# Patient Record
Sex: Female | Born: 1945
Health system: Southern US, Community
[De-identification: ages and names within clinical notes are randomized; demographics above are authoritative.]

## PROBLEM LIST (undated history)

## (undated) DIAGNOSIS — M545 Low back pain, unspecified: Secondary | ICD-10-CM

## (undated) DIAGNOSIS — F32A Depression, unspecified: Secondary | ICD-10-CM

## (undated) DIAGNOSIS — G454 Transient global amnesia: Secondary | ICD-10-CM

## (undated) DIAGNOSIS — F329 Major depressive disorder, single episode, unspecified: Secondary | ICD-10-CM

## (undated) DIAGNOSIS — M1990 Unspecified osteoarthritis, unspecified site: Secondary | ICD-10-CM

## (undated) DIAGNOSIS — M5417 Radiculopathy, lumbosacral region: Secondary | ICD-10-CM

## (undated) DIAGNOSIS — F419 Anxiety disorder, unspecified: Secondary | ICD-10-CM

## (undated) DIAGNOSIS — M797 Fibromyalgia: Secondary | ICD-10-CM

## (undated) DIAGNOSIS — E079 Disorder of thyroid, unspecified: Secondary | ICD-10-CM

## (undated) DIAGNOSIS — I10 Essential (primary) hypertension: Secondary | ICD-10-CM

## (undated) DIAGNOSIS — J449 Chronic obstructive pulmonary disease, unspecified: Secondary | ICD-10-CM

## (undated) DIAGNOSIS — G8929 Other chronic pain: Secondary | ICD-10-CM

## (undated) DIAGNOSIS — E275 Adrenomedullary hyperfunction: Secondary | ICD-10-CM

## (undated) DIAGNOSIS — Z87442 Personal history of urinary calculi: Secondary | ICD-10-CM

## (undated) DIAGNOSIS — I639 Cerebral infarction, unspecified: Secondary | ICD-10-CM

## (undated) DIAGNOSIS — G459 Transient cerebral ischemic attack, unspecified: Secondary | ICD-10-CM

## (undated) DIAGNOSIS — S8291XA Unspecified fracture of right lower leg, initial encounter for closed fracture: Secondary | ICD-10-CM

## (undated) DIAGNOSIS — M329 Systemic lupus erythematosus, unspecified: Secondary | ICD-10-CM

## (undated) DIAGNOSIS — S4291XA Fracture of right shoulder girdle, part unspecified, initial encounter for closed fracture: Secondary | ICD-10-CM

## (undated) DIAGNOSIS — S92909A Unspecified fracture of unspecified foot, initial encounter for closed fracture: Secondary | ICD-10-CM

## (undated) DIAGNOSIS — I209 Angina pectoris, unspecified: Secondary | ICD-10-CM

## (undated) DIAGNOSIS — G43909 Migraine, unspecified, not intractable, without status migrainosus: Secondary | ICD-10-CM

## (undated) DIAGNOSIS — G629 Polyneuropathy, unspecified: Secondary | ICD-10-CM

## (undated) DIAGNOSIS — R609 Edema, unspecified: Secondary | ICD-10-CM

## (undated) DIAGNOSIS — T7840XA Allergy, unspecified, initial encounter: Secondary | ICD-10-CM

## (undated) DIAGNOSIS — K219 Gastro-esophageal reflux disease without esophagitis: Secondary | ICD-10-CM

## (undated) DIAGNOSIS — C50919 Malignant neoplasm of unspecified site of unspecified female breast: Secondary | ICD-10-CM

## (undated) HISTORY — DX: Systemic lupus erythematosus, unspecified: M32.9

## (undated) HISTORY — DX: Migraine, unspecified, not intractable, without status migrainosus: G43.909

## (undated) HISTORY — DX: Unspecified fracture of right lower leg, initial encounter for closed fracture: S82.91XA

## (undated) HISTORY — DX: Radiculopathy, lumbosacral region: M54.17

## (undated) HISTORY — DX: Disorder of thyroid, unspecified: E07.9

## (undated) HISTORY — DX: Transient cerebral ischemic attack, unspecified: G45.9

## (undated) HISTORY — PX: OTHER SURGICAL HISTORY: SHX169

## (undated) HISTORY — DX: Adrenomedullary hyperfunction: E27.5

## (undated) HISTORY — DX: Unspecified fracture of unspecified foot, initial encounter for closed fracture: S92.909A

## (undated) HISTORY — DX: Other chronic pain: G89.29

## (undated) HISTORY — DX: Low back pain: M54.5

## (undated) HISTORY — DX: Gastro-esophageal reflux disease without esophagitis: K21.9

## (undated) HISTORY — DX: Anxiety disorder, unspecified: F41.9

## (undated) HISTORY — DX: Edema, unspecified: R60.9

## (undated) HISTORY — PX: FRACTURE SURGERY: SHX138

## (undated) HISTORY — DX: Transient global amnesia: G45.4

## (undated) HISTORY — DX: Low back pain, unspecified: M54.50

---
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
## 1977-12-13 HISTORY — PX: CHOLECYSTECTOMY: SHX55

## 1979-12-14 HISTORY — PX: ABDOMINAL HYSTERECTOMY: SHX81

## 1987-12-14 HISTORY — PX: BACK SURGERY: SHX140

## 1988-12-13 DIAGNOSIS — C50919 Malignant neoplasm of unspecified site of unspecified female breast: Secondary | ICD-10-CM

## 1988-12-13 HISTORY — DX: Malignant neoplasm of unspecified site of unspecified female breast: C50.919

## 1988-12-13 HISTORY — PX: MASTECTOMY SUBCUTANEOUS: SUR853

## 1988-12-13 HISTORY — PX: BILATERAL TOTAL MASTECTOMY WITH AXILLARY LYMPH NODE DISSECTION: SHX6364

## 1988-12-13 HISTORY — PX: AUGMENTATION MAMMAPLASTY: SUR837

## 2004-11-25 ENCOUNTER — Ambulatory Visit: Payer: Self-pay | Admitting: Physician Assistant

## 2006-03-15 ENCOUNTER — Ambulatory Visit: Payer: Self-pay | Admitting: Unknown Physician Specialty

## 2006-07-27 ENCOUNTER — Ambulatory Visit: Payer: Self-pay | Admitting: Unknown Physician Specialty

## 2006-07-29 ENCOUNTER — Ambulatory Visit: Payer: Self-pay | Admitting: Unknown Physician Specialty

## 2006-08-01 ENCOUNTER — Ambulatory Visit: Payer: Self-pay | Admitting: Unknown Physician Specialty

## 2006-11-07 ENCOUNTER — Ambulatory Visit: Payer: Self-pay | Admitting: Urology

## 2006-12-14 ENCOUNTER — Ambulatory Visit: Payer: Self-pay | Admitting: Urology

## 2007-06-14 ENCOUNTER — Ambulatory Visit: Payer: Self-pay | Admitting: Urology

## 2008-03-29 ENCOUNTER — Ambulatory Visit: Payer: Self-pay | Admitting: Urology

## 2008-04-11 ENCOUNTER — Ambulatory Visit: Payer: Self-pay | Admitting: Family Medicine

## 2008-04-12 ENCOUNTER — Emergency Department: Payer: Self-pay | Admitting: Emergency Medicine

## 2008-06-12 ENCOUNTER — Emergency Department: Payer: Self-pay | Admitting: Emergency Medicine

## 2008-11-20 ENCOUNTER — Ambulatory Visit: Payer: Self-pay | Admitting: Urology

## 2008-12-13 LAB — HM COLONOSCOPY: HM Colonoscopy: NORMAL

## 2009-05-19 ENCOUNTER — Ambulatory Visit: Payer: Self-pay | Admitting: Family Medicine

## 2009-09-24 ENCOUNTER — Ambulatory Visit: Payer: Self-pay | Admitting: Urology

## 2009-11-17 ENCOUNTER — Ambulatory Visit: Payer: Self-pay | Admitting: Family Medicine

## 2009-12-13 DIAGNOSIS — G454 Transient global amnesia: Secondary | ICD-10-CM

## 2009-12-13 HISTORY — PX: LIVER BIOPSY: SHX301

## 2009-12-13 HISTORY — DX: Transient global amnesia: G45.4

## 2010-06-26 ENCOUNTER — Ambulatory Visit: Payer: Self-pay | Admitting: Unknown Physician Specialty

## 2010-09-26 ENCOUNTER — Ambulatory Visit: Payer: Self-pay | Admitting: Internal Medicine

## 2010-09-30 ENCOUNTER — Ambulatory Visit: Payer: Self-pay | Admitting: Internal Medicine

## 2010-11-13 ENCOUNTER — Ambulatory Visit: Payer: Self-pay | Admitting: Unknown Physician Specialty

## 2011-01-05 ENCOUNTER — Ambulatory Visit: Payer: Self-pay | Admitting: Unknown Physician Specialty

## 2011-01-21 LAB — PATHOLOGY REPORT

## 2011-06-14 ENCOUNTER — Ambulatory Visit: Payer: Self-pay | Admitting: Unknown Physician Specialty

## 2011-07-22 ENCOUNTER — Ambulatory Visit: Payer: Self-pay | Admitting: Family Medicine

## 2012-02-28 ENCOUNTER — Observation Stay: Payer: Self-pay | Admitting: *Deleted

## 2012-02-28 LAB — CBC
HCT: 39.8 % (ref 35.0–47.0)
MCHC: 33 g/dL (ref 32.0–36.0)
MCV: 91 fL (ref 80–100)
Platelet: 233 10*3/uL (ref 150–440)
RDW: 14.1 % (ref 11.5–14.5)

## 2012-02-28 LAB — COMPREHENSIVE METABOLIC PANEL
Albumin: 4.1 g/dL (ref 3.4–5.0)
Alkaline Phosphatase: 129 U/L (ref 50–136)
Anion Gap: 10 (ref 7–16)
BUN: 24 mg/dL — ABNORMAL HIGH (ref 7–18)
Calcium, Total: 9 mg/dL (ref 8.5–10.1)
Glucose: 87 mg/dL (ref 65–99)
SGOT(AST): 28 U/L (ref 15–37)
SGPT (ALT): 28 U/L

## 2012-02-28 LAB — TROPONIN I: Troponin-I: <0.02 ng/mL

## 2012-02-28 LAB — CK TOTAL AND CKMB (NOT AT ARMC)
CK, Total: 107 U/L (ref 21–215)
CK-MB: 2.5 ng/mL (ref 0.5–3.6)

## 2012-02-29 LAB — CBC WITH DIFFERENTIAL/PLATELET
Basophil %: 0.7 %
Eosinophil #: 0.4 10*3/uL (ref 0.0–0.7)
Eosinophil %: 6 %
HCT: 34.8 % — ABNORMAL LOW (ref 35.0–47.0)
HGB: 11.7 g/dL — ABNORMAL LOW (ref 12.0–16.0)
Lymphocyte #: 2.3 10*3/uL (ref 1.0–3.6)
MCH: 30.3 pg (ref 26.0–34.0)
MCV: 90 fL (ref 80–100)
Monocyte #: 0.5 10*3/uL (ref 0.0–0.7)
Neutrophil #: 3.6 10*3/uL (ref 1.4–6.5)
Neutrophil %: 52.3 %
Platelet: 193 10*3/uL (ref 150–440)
RBC: 3.85 10*6/uL (ref 3.80–5.20)
RDW: 14.2 % (ref 11.5–14.5)

## 2012-02-29 LAB — BASIC METABOLIC PANEL
Anion Gap: 13 (ref 7–16)
BUN: 22 mg/dL — ABNORMAL HIGH (ref 7–18)
Chloride: 110 mmol/L — ABNORMAL HIGH (ref 98–107)
Creatinine: 0.97 mg/dL (ref 0.60–1.30)
EGFR (African American): >60
EGFR (Non-African Amer.): >60
Glucose: 105 mg/dL — ABNORMAL HIGH (ref 65–99)
Osmolality: 298 (ref 275–301)

## 2012-02-29 LAB — SODIUM: Sodium: 145 mmol/L (ref 136–145)

## 2012-02-29 LAB — CK TOTAL AND CKMB (NOT AT ARMC): CK-MB: 2.2 ng/mL (ref 0.5–3.6)

## 2012-02-29 LAB — TROPONIN I: Troponin-I: <0.02 ng/mL

## 2012-11-28 ENCOUNTER — Ambulatory Visit: Payer: Self-pay | Admitting: Internal Medicine

## 2013-05-15 ENCOUNTER — Ambulatory Visit: Payer: Self-pay | Admitting: Neurology

## 2013-05-22 ENCOUNTER — Encounter: Payer: Self-pay | Admitting: Neurology

## 2013-06-12 ENCOUNTER — Encounter: Payer: Self-pay | Admitting: Neurology

## 2013-07-13 ENCOUNTER — Encounter: Payer: Self-pay | Admitting: Neurology

## 2013-11-19 ENCOUNTER — Emergency Department (HOSPITAL_COMMUNITY)
Admission: EM | Admit: 2013-11-19 | Discharge: 2013-11-20 | Disposition: A | Discharge status: Home / Self Care | Payer: Medicare Other | Attending: Emergency Medicine | Admitting: Emergency Medicine

## 2013-11-19 DIAGNOSIS — IMO0001 Reserved for inherently not codable concepts without codable children: Secondary | ICD-10-CM | POA: Insufficient documentation

## 2013-11-19 DIAGNOSIS — R531 Weakness: Secondary | ICD-10-CM

## 2013-11-19 DIAGNOSIS — G8190 Hemiplegia, unspecified affecting unspecified side: Secondary | ICD-10-CM

## 2013-11-19 DIAGNOSIS — Z853 Personal history of malignant neoplasm of breast: Secondary | ICD-10-CM | POA: Diagnosis not present

## 2013-11-19 DIAGNOSIS — R4789 Other speech disturbances: Secondary | ICD-10-CM | POA: Diagnosis not present

## 2013-11-19 DIAGNOSIS — M6281 Muscle weakness (generalized): Secondary | ICD-10-CM | POA: Diagnosis present

## 2013-11-19 DIAGNOSIS — R29810 Facial weakness: Secondary | ICD-10-CM | POA: Insufficient documentation

## 2013-11-19 DIAGNOSIS — R51 Headache: Secondary | ICD-10-CM | POA: Insufficient documentation

## 2013-11-19 DIAGNOSIS — R209 Unspecified disturbances of skin sensation: Secondary | ICD-10-CM | POA: Insufficient documentation

## 2013-11-19 DIAGNOSIS — G609 Hereditary and idiopathic neuropathy, unspecified: Secondary | ICD-10-CM | POA: Insufficient documentation

## 2013-11-19 HISTORY — DX: Fibromyalgia: M79.7

## 2013-11-19 HISTORY — DX: Malignant neoplasm of unspecified site of unspecified female breast: C50.919

## 2013-11-19 HISTORY — DX: Polyneuropathy, unspecified: G62.9

## 2013-11-19 NOTE — ED Provider Notes (Signed)
CSN: [id number]     Arrival date & time 11/19/13  2352 History   First MD Initiated Contact with Patient 11/19/13 2352     No chief complaint on file.  (Consider location/radiation/quality/duration/timing/severity/associated sxs/prior Treatment) HPI History provided by EMS the patient.  67 y.o. female that got up from bed this evening to go to the bathroom. On coming out the patient's husband reports that she was talking out of the side of her mouth. She then began to feel poorly on her right and noticed her right side become numb and weak. She lowered herself to a chair and EMS was called. The patient was brought in as a code stroke, noted to have left facial droop and right upper extremity weakness - sent to CT scan  No past medical history on file. No past surgical history on file. No family history on file. History  Substance Use Topics  . Smoking status: Not on file  . Smokeless tobacco: Not on file  . Alcohol Use: Not on file   OB History   No data available     Review of Systems  Constitutional: Negative for fever and chills.  Eyes: Negative for visual disturbance.  Respiratory: Negative for shortness of breath.   Cardiovascular: Negative for chest pain.  Gastrointestinal: Negative for abdominal pain.  Genitourinary: Negative for dysuria.  Musculoskeletal: Negative for back pain, neck pain and neck stiffness.  Skin: Negative for rash.  Neurological: Positive for speech difficulty and weakness. Negative for headaches.  All other systems reviewed and are negative.    Allergies  Review of patient's allergies indicates not on file.  Home Medications  No current outpatient prescriptions on file. There were no vitals taken for this visit. Physical Exam  Constitutional: She is oriented to person, place, and time. She appears well-developed and well-nourished.  HENT:  Head: Normocephalic and atraumatic.  Eyes: EOM are normal. Pupils are equal, round, and reactive to  light.  Neck: Neck supple.  Cardiovascular: Normal rate, regular rhythm and intact distal pulses.   Pulmonary/Chest: Effort normal and breath sounds normal. No respiratory distress.  Abdominal: Soft. There is no tenderness.  Musculoskeletal: She exhibits no edema and no tenderness.  Neurological: She is alert and oriented to person, place, and time.  Left facial droop, mild slurred speech, right upper extremity flaccid paralysis  Skin: Skin is warm and dry.    ED Course  Procedures (including critical care time) Labs Review Labs Reviewed  APTT - Abnormal; Notable for the following:    aPTT 23 (*)    All other components within normal limits  COMPREHENSIVE METABOLIC PANEL - Abnormal; Notable for the following:    BUN 25 (*)    Alkaline Phosphatase 142 (*)    Total Bilirubin 0.2 (*)    GFR calc non Af Amer 65 (*)    GFR calc Af Amer 75 (*)    All other components within normal limits  URINE RAPID DRUG SCREEN (HOSP PERFORMED) - Abnormal; Notable for the following:    Benzodiazepines POSITIVE (*)    All other components within normal limits  URINALYSIS, ROUTINE W REFLEX MICROSCOPIC - Abnormal; Notable for the following:    Leukocytes, UA SMALL (*)    All other components within normal limits  POCT I-STAT, CHEM 8 - Abnormal; Notable for the following:    BUN 26 (*)    Glucose, Bld 101 (*)    Calcium, Ion 1.10 (*)    All other components within normal limits  ETHANOL  PROTIME-INR  CBC  DIFFERENTIAL  TROPONIN I  GLUCOSE, CAPILLARY  URINE MICROSCOPIC-ADD ON  POCT I-STAT TROPONIN I   Imaging Review Ct Angio Head W/cm &/or Wo Cm  11/20/2013   CLINICAL DATA:  Code stroke. Right facial droop, right-sided weakness. Headache. History of breast cancer.  EXAM: CT ANGIOGRAPHY HEAD AND NECK  TECHNIQUE: Multidetector CT imaging of the head and neck was performed using the standard protocol during bolus administration of intravenous contrast. Multiplanar CT image reconstructions including  MIPs were obtained to evaluate the vascular anatomy. Carotid stenosis measurements (when applicable) are obtained utilizing NASCET criteria, using the distal internal carotid diameter as the denominator.  CONTRAST:  100cc of Omnipaque 350  COMPARISON:  CT of the head  November 20, 2013 at 0001 hr.  FINDINGS: CTA HEAD FINDINGS  Anterior circulation: The bilateral petrous, cavernous and supra clinoid internal carotid arteries appear widely patent, normal in course and caliber. Patent anterior communicating artery with normal appearance of the anterior and middle cerebral arteries, including more distal segments.  Posterior circulation: Mildly tortuous basilar artery is widely patent, as are the intradural bilateral vertebral arteries. Normal basilar main branch vessels appear patent. Small bilateral posterior communicating artery contribute to the posterior circulation with normal posterior cerebral arteries. Persistent trigeminal artery on the left.  No large vessel occlusion,  Review of the MIP images confirms the above findings.  CTA NECK FINDINGS  Three vessel aortic arch, which demonstrates mild calcific atherosclerosis including eccentric calcification at the origin though left subclavian artery which remains widely patent. The bilateral common carotid artery origins are widely patent. Bilateral common carotid arteries are normal in course and caliber, eccentric calcification of the origin of the right internal carotid artery measures up to 2 mm, to a lesser extent on the left. The bilateral cervical internal carotid arteries are normal in course and caliber, bilateral tonsillar loops noted.  The right vertebral artery is mildly dominant. The origins of the bilateral vertebral arteries appears widely patent. The bilateral vertebral arteries are normal in course and caliber, mild tortuosity of the distal left vertebral artery, from intra foraminal to extra foraminal segment.  No large vessel occlusion,  hemodynamically significant stenosis, pseudoaneurysm or dissection.  Torus palatini. Poor dentition with dental prosthesis which results in streak artifact limiting evaluation. Moderate midcervical degenerative disc disease with upper cervical facet arthropathy most noted on the right. Partially imaged bilateral breast implants.  Review of the MIP images confirms the above findings.  IMPRESSION: CTA head: No hemodynamically significant stenosis. Normal variants of the circle of Willis as described above.  CTA Neck:  No hemodynamically significant stenosis.  Critical Value/emergent results were called by telephone at the time of interpretation on 11/20/2013 at 2:15 AM to Dr. Thana Farr , who verbally acknowledged these results.   Electronically Signed   By: Awilda Metro   On: 11/20/2013 02:24   Ct Head Wo Contrast  11/20/2013   CLINICAL DATA:  Headache. Right-sided facial droop. Right-sided weakness.  EXAM: CT HEAD WITHOUT CONTRAST  TECHNIQUE: Contiguous axial images were obtained from the base of the skull through the vertex without intravenous contrast.  COMPARISON:  None.  FINDINGS: There is no intra or extra-axial fluid collection or mass lesion. The basilar cisterns and ventricles have a normal appearance. There is no CT evidence for acute infarction or hemorrhage. Bone windows are unremarkable.  IMPRESSION: No evidence for acute intracranial abnormality.  Critical Value/emergent results were called by telephone at the time of interpretation on  11/20/2013 at 12:06 AM to Dr. Sunnie Nielsen , who verbally acknowledged these results.   Electronically Signed   By: Rosalie Gums M.D.   On: 11/20/2013 00:07   Ct Angio Neck W/cm &/or Wo/cm  11/20/2013   CLINICAL DATA:  Code stroke. Right facial droop, right-sided weakness. Headache. History of breast cancer.  EXAM: CT ANGIOGRAPHY HEAD AND NECK  TECHNIQUE: Multidetector CT imaging of the head and neck was performed using the standard protocol during bolus  administration of intravenous contrast. Multiplanar CT image reconstructions including MIPs were obtained to evaluate the vascular anatomy. Carotid stenosis measurements (when applicable) are obtained utilizing NASCET criteria, using the distal internal carotid diameter as the denominator.  CONTRAST:  100cc of Omnipaque 350  COMPARISON:  CT of the head  November 20, 2013 at 0001 hr.  FINDINGS: CTA HEAD FINDINGS  Anterior circulation: The bilateral petrous, cavernous and supra clinoid internal carotid arteries appear widely patent, normal in course and caliber. Patent anterior communicating artery with normal appearance of the anterior and middle cerebral arteries, including more distal segments.  Posterior circulation: Mildly tortuous basilar artery is widely patent, as are the intradural bilateral vertebral arteries. Normal basilar main branch vessels appear patent. Small bilateral posterior communicating artery contribute to the posterior circulation with normal posterior cerebral arteries. Persistent trigeminal artery on the left.  No large vessel occlusion,  Review of the MIP images confirms the above findings.  CTA NECK FINDINGS  Three vessel aortic arch, which demonstrates mild calcific atherosclerosis including eccentric calcification at the origin though left subclavian artery which remains widely patent. The bilateral common carotid artery origins are widely patent. Bilateral common carotid arteries are normal in course and caliber, eccentric calcification of the origin of the right internal carotid artery measures up to 2 mm, to a lesser extent on the left. The bilateral cervical internal carotid arteries are normal in course and caliber, bilateral tonsillar loops noted.  The right vertebral artery is mildly dominant. The origins of the bilateral vertebral arteries appears widely patent. The bilateral vertebral arteries are normal in course and caliber, mild tortuosity of the distal left vertebral artery,  from intra foraminal to extra foraminal segment.  No large vessel occlusion, hemodynamically significant stenosis, pseudoaneurysm or dissection.  Torus palatini. Poor dentition with dental prosthesis which results in streak artifact limiting evaluation. Moderate midcervical degenerative disc disease with upper cervical facet arthropathy most noted on the right. Partially imaged bilateral breast implants.  Review of the MIP images confirms the above findings.  IMPRESSION: CTA head: No hemodynamically significant stenosis. Normal variants of the circle of Willis as described above.  CTA Neck:  No hemodynamically significant stenosis.  Critical Value/emergent results were called by telephone at the time of interpretation on 11/20/2013 at 2:15 AM to Dr. Thana Farr , who verbally acknowledged these results.   Electronically Signed   By: Awilda Metro   On: 11/20/2013 02:24    Date: 11/20/2013  Rate: 69  Rhythm: normal sinus rhythm  QRS Axis: normal  Intervals: normal  ST/T Wave abnormalities: nonspecific ST changes  Conduction Disutrbances:none  Narrative Interpretation:   Old EKG Reviewed: none available  Evaluated by neurology, Dr. Thad Ranger on arrival. CT scan reviewed as above. Labs and EKG obtained and reviewed.   Symptoms not consistent with stroke. On recheck symptoms resolved and patient ambulating without distress or any further weakness. Patient is requesting be discharged and Dr. Thad Ranger feels comfortable with plan discharge home and outpatient followup. PT will take ASA daily.  MDM  Diagnoses: Weakness resolved  Brought in by EMS as a code stroke, history of fibromyalgia and clinically no CVA. Workup as above including EKG, labs and advanced neuroimaging. Not a code stroke. Neurology evaluation and safe for discharge with symptoms resolved.   Sunnie Nielsen, MD 11/20/13 [phone]

## 2013-11-20 ENCOUNTER — Emergency Department (HOSPITAL_COMMUNITY): Payer: Medicare Other

## 2013-11-20 ENCOUNTER — Encounter (HOSPITAL_COMMUNITY): Payer: Self-pay | Admitting: Emergency Medicine

## 2013-11-20 DIAGNOSIS — G8190 Hemiplegia, unspecified affecting unspecified side: Secondary | ICD-10-CM

## 2013-11-20 LAB — COMPREHENSIVE METABOLIC PANEL
ALT: 19 U/L (ref 0–35)
AST: 27 U/L (ref 0–37)
Alkaline Phosphatase: 142 U/L — ABNORMAL HIGH (ref 39–117)
CO2: 24 mEq/L (ref 19–32)
Calcium: 9 mg/dL (ref 8.4–10.5)
Chloride: 103 mEq/L (ref 96–112)
GFR calc Af Amer: 75 mL/min — ABNORMAL LOW (ref >90)
GFR calc non Af Amer: 65 mL/min — ABNORMAL LOW (ref >90)
Glucose, Bld: 98 mg/dL (ref 70–99)
Potassium: 4.8 mEq/L (ref 3.5–5.1)
Sodium: 139 mEq/L (ref 135–145)
Total Bilirubin: 0.2 mg/dL — ABNORMAL LOW (ref 0.3–1.2)

## 2013-11-20 LAB — POCT I-STAT, CHEM 8
BUN: 26 mg/dL — ABNORMAL HIGH (ref 6–23)
Calcium, Ion: 1.1 mmol/L — ABNORMAL LOW (ref 1.13–1.30)
Chloride: 106 mEq/L (ref 96–112)
Creatinine, Ser: 1.1 mg/dL (ref 0.50–1.10)
Glucose, Bld: 101 mg/dL — ABNORMAL HIGH (ref 70–99)
HCT: 42 % (ref 36.0–46.0)
Hemoglobin: 14.3 g/dL (ref 12.0–15.0)
Potassium: 4.6 mEq/L (ref 3.5–5.1)

## 2013-11-20 LAB — URINALYSIS, ROUTINE W REFLEX MICROSCOPIC
Bilirubin Urine: NEGATIVE
Glucose, UA: NEGATIVE mg/dL
Nitrite: NEGATIVE
Specific Gravity, Urine: 1.025 (ref 1.005–1.030)
Urobilinogen, UA: 0.2 mg/dL (ref 0.0–1.0)
pH: 6 (ref 5.0–8.0)

## 2013-11-20 LAB — DIFFERENTIAL
Basophils Absolute: 0 10*3/uL (ref 0.0–0.1)
Eosinophils Relative: 5 % (ref 0–5)
Lymphocytes Relative: 34 % (ref 12–46)
Lymphs Abs: 3.3 10*3/uL (ref 0.7–4.0)
Neutro Abs: 5.2 10*3/uL (ref 1.7–7.7)
Neutrophils Relative %: 54 % (ref 43–77)

## 2013-11-20 LAB — APTT: aPTT: 23 seconds — ABNORMAL LOW (ref 24–37)

## 2013-11-20 LAB — RAPID URINE DRUG SCREEN, HOSP PERFORMED
Barbiturates: NOT DETECTED
Cocaine: NOT DETECTED
Tetrahydrocannabinol: NOT DETECTED

## 2013-11-20 LAB — POCT I-STAT TROPONIN I: Troponin i, poc: 0.01 ng/mL (ref 0.00–0.08)

## 2013-11-20 LAB — CBC
MCH: 30.7 pg (ref 26.0–34.0)
MCV: 89.5 fL (ref 78.0–100.0)
Platelets: 221 10*3/uL (ref 150–400)
RBC: 4.37 MIL/uL (ref 3.87–5.11)
WBC: 9.6 10*3/uL (ref 4.0–10.5)

## 2013-11-20 LAB — URINE MICROSCOPIC-ADD ON

## 2013-11-20 LAB — PROTIME-INR
INR: 0.86 (ref 0.00–1.49)
Prothrombin Time: 11.6 seconds (ref 11.6–15.2)

## 2013-11-20 LAB — GLUCOSE, CAPILLARY: Glucose-Capillary: 96 mg/dL (ref 70–99)

## 2013-11-20 MED ORDER — IOHEXOL 350 MG/ML SOLN
40.0000 mL | Freq: Once | INTRAVENOUS | Status: AC | PRN
  Administered 2013-11-20: 40 mL via INTRAVENOUS

## 2013-11-20 NOTE — ED Notes (Signed)
Patient transported to CT 

## 2013-11-20 NOTE — ED Notes (Signed)
Patient transported back from CT 

## 2013-11-20 NOTE — Consult Note (Signed)
Referring Physician: Dierdre Highman    Chief Complaint: Right sided weakness and numbness  HPI: Donna Evans is an 67 y.o. female that got up from bed this evening to go to the bathroom.  She had a hard time having a bowel movement while in the bathroom.  On coming out the patient's husband reports that she was talking out of the side of her mouth.  She then began to feel poorly on her right and noticed her right side become numb and weak.  She lowered herself to a chair and EMS was called.  The patient was brought in as a code stroke.    Date last known well: Date: 11/20/2013 Time last known well: Time: 22:30 tPA Given: No: Not felt to be a stroke  Past Medical History  Diagnosis Date  . Breast cancer   . Fibromyalgia   . Peripheral neuropathy     Past Surgical History  Procedure Laterality Date  . Bilateral total mastectomy with axillary lymph node dissection      Family History  Problem Relation Age of Onset  . Atrial fibrillation Mother   . Atrial fibrillation Sister   . Diabetes Father    Social History:  reports that she has never smoked. She does not have any smokeless tobacco history on file. She reports that she does not drink alcohol or use illicit drugs.  Allergies:  Allergies  Allergen Reactions  . Codeine Other (See Comments)    unknown  . Demerol [Meperidine] Other (See Comments)    Unknown   . Morphine And Related Other (See Comments)    unknown  . Latex Rash    Medications: I have reviewed the patient's current medications. Prior to Admission:  Current outpatient prescriptions: ALPRAZolam (XANAX) 1 MG tablet, Take 1 tablet by mouth 4 (four) times daily., Disp: , Rfl: ;   butorphanol (STADOL) 10 MG/ML nasal spray, Place 1 spray into the nose every 4 (four) hours as needed for headache. , Disp: , gabapentin (NEURONTIN) 300 MG capsule, Take 1 capsule by mouth 3 (three) times daily., Disp: , Rfl:  MICRONIZED COLESTIPOL HCL 1 G tablet, Take 1 tablet by mouth 3  (three) times daily., Disp: , Rfl: ;   omeprazole (PRILOSEC) 20 MG capsule, Take 1 capsule by mouth daily., Disp: , Rfl: ;   zolpidem (AMBIEN) 10 MG tablet, Take 10 mg by mouth at bedtime as needed for sleep. , Disp: , Rfl:   ROS: History obtained from the patient  General ROS: negative for - chills, fatigue, fever, night sweats, weight gain or weight loss Psychological ROS: negative for - behavioral disorder, hallucinations, memory difficulties, mood swings or suicidal ideation Ophthalmic ROS: negative for - blurry vision, double vision, eye pain or loss of vision ENT ROS: negative for - epistaxis, nasal discharge, oral lesions, sore throat, tinnitus or vertigo Allergy and Immunology ROS: negative for - hives or itchy/watery eyes Hematological and Lymphatic ROS: negative for - bleeding problems, bruising or swollen lymph nodes Endocrine ROS: negative for - galactorrhea, hair pattern changes, polydipsia/polyuria or temperature intolerance Respiratory ROS: negative for - cough, hemoptysis, shortness of breath or wheezing Cardiovascular ROS: negative for - chest pain, dyspnea on exertion, edema or irregular heartbeat Gastrointestinal ROS: negative for - abdominal pain, diarrhea, hematemesis, nausea/vomiting or stool incontinence Genito-Urinary ROS: negative for - dysuria, hematuria, incontinence or urinary frequency/urgency Musculoskeletal ROS: pain all over Neurological ROS: as noted in HPI Dermatological ROS: negative for rash and skin lesion changes  Physical Examination: Blood pressure  164/62, pulse 65, temperature 98.1 F (36.7 C), temperature source Oral, resp. rate 12, SpO2 100.00%.  Neurologic Examination: Mental Status: Alert, oriented, thought content appropriate.  Speech fluent without evidence of aphasia.  Slurred speech noted.  Able to follow 3 step commands without difficulty. Cranial Nerves: II: Discs flat bilaterally; Visual fields grossly normal, pupils equal, round,  reactive to light and accommodation III,IV, VI: ptosis not present, extra-ocular motions intact bilaterally V,VII: Mouth pulled to the right, facial light touch sensation decreased on the right and splitting the midline VIII: hearing normal bilaterally IX,X: gag reflex present XI: bilateral shoulder shrug XII: right tongue deviation Motor: Right : Upper extremity   3/5     Left:     Upper extremity   5/5  Lower extremity   2-3/5     Lower extremity   5/5 Tone and bulk:normal tone throughout; no atrophy noted Sensory: Pinprick and light touch decreased on the right Deep Tendon Reflexes: 2+ and symmetric with absent AJ's bilaterally Plantars: Right: downgoing   Left: downgoing Cerebellar: normal finger-to-nose and normal heel-to-shin test Gait: Unable to test CV: pulses palpable throughout    Laboratory Studies:  Basic Metabolic Panel:  Recent Labs Lab 11/20/13 0001  NA 141  K 4.6  CL 106  GLUCOSE 101*  BUN 26*  CREATININE 1.10    Liver Function Tests: No results found for this basename: AST, ALT, ALKPHOS, BILITOT, PROT, ALBUMIN,  in the last 168 hours No results found for this basename: LIPASE, AMYLASE,  in the last 168 hours No results found for this basename: AMMONIA,  in the last 168 hours  CBC:  Recent Labs Lab 11/19/13 2357 11/20/13 0001  WBC 9.6  --   NEUTROABS 5.2  --   HGB 13.4 14.3  HCT 39.1 42.0  MCV 89.5  --   PLT 221  --     Cardiac Enzymes: No results found for this basename: CKTOTAL, CKMB, CKMBINDEX, TROPONINI,  in the last 168 hours  BNP: No components found with this basename: POCBNP,   CBG:  Recent Labs Lab 11/20/13 0045  GLUCAP 96    Microbiology: No results found for this or any previous visit.  Coagulation Studies:  Recent Labs  11/19/13 2357  LABPROT 11.6  INR 0.86    Urinalysis: No results found for this basename: COLORURINE, APPERANCEUR, LABSPEC, PHURINE, GLUCOSEU, HGBUR, BILIRUBINUR, KETONESUR, PROTEINUR,  UROBILINOGEN, NITRITE, LEUKOCYTESUR,  in the last 168 hours  Lipid Panel: No results found for this basename: chol,  trig,  hdl,  cholhdl,  vldl,  ldlcalc    HgbA1C:  No results found for this basename: HGBA1C    Urine Drug Screen:   No results found for this basename: labopia,  cocainscrnur,  labbenz,  amphetmu,  thcu,  labbarb    Alcohol Level: No results found for this basename: ETH,  in the last 168 hours  Other results: EKG:  sinus rhythm at 69 bpm.  Imaging: Ct Head Wo Contrast  11/20/2013   CLINICAL DATA:  Headache. Right-sided facial droop. Right-sided weakness.  EXAM: CT HEAD WITHOUT CONTRAST  TECHNIQUE: Contiguous axial images were obtained from the base of the skull through the vertex without intravenous contrast.  COMPARISON:  None.  FINDINGS: There is no intra or extra-axial fluid collection or mass lesion. The basilar cisterns and ventricles have a normal appearance. There is no CT evidence for acute infarction or hemorrhage. Bone windows are unremarkable.  IMPRESSION: No evidence for acute intracranial abnormality.  Critical Value/emergent results  were called by telephone at the time of interpretation on 11/20/2013 at 12:06 AM to Dr. Sunnie Nielsen , who verbally acknowledged these results.   Electronically Signed   By: Rosalie Gums M.D.   On: 11/20/2013 00:07    Assessment: 67 y.o. female presenting with slurred speech and right hemiparesis.  Neurological examination shows multiple functional features.  Has no known vascular risk factors.  Patient on ASA at home.  Head CT reviewed and shows no acute changes.  Further work up recommended.    Stroke Risk Factors - none  Plan: 1. CT angio of the head and neck, CT perfusion study tonight 2. Telemetry monitoring 3. Frequent neuro checks   Addendum: CTA of head and neck reviewed and discussed with radiology.  No significant abnormalities noted.  CT perfusion study suboptimal.  On re-examination of patient she is at baseline.   Patient ambulatory independently and no focality noted.  Do not suspect an ischemic event.  Vitals have been stable.  Patient wishes to return home.  Case discussed with husband and questions answered.    Recommendations: 1.  Patient may be discharged to home  2.  Patient to continue ASA daily 3.  Patient to follow up with her outpatient PCP  Case discussed with Dr. Joneen Boers, MD [facility] [phone] 11/20/2013, 2:50 AM

## 2013-11-20 NOTE — ED Notes (Signed)
MD at bedside. 

## 2013-11-20 NOTE — ED Notes (Signed)
96 blood sugar level

## 2013-11-20 NOTE — Progress Notes (Signed)
Chaplain paged to take family of code stroke pt to consult room for doctor to update them. Found pt's husband in ED waiting area and escorted him to consult B. When I went to Trauma A to find doctor, nurse said I could bring pt's husband to see her. I brought him in right away. Pt's speech was improving already. I provided emotional and spiritual support for pt and husband. When pt was taken to CT I stayed with husband and had supportive conversation with him. He shared about their loss of three adult sons to various illnesses. He expressed appreciation for chaplain support.

## 2013-11-20 NOTE — Code Documentation (Signed)
Code stroke called at 72 for a 67 y/o white female LSW at 2230 hrs at home with husband. Pt was returning from bathroom when she developed slurred speech, right facial droop,   RUE and RLE weakness, generalzied HA.  Pt took 2 baby ASA at home.  Brought to ED by [facility] at 2352 hrs on 11/19/2013 with EDP Dr Dierdre Highman, RRT RN, phlebotomy at bedside. CBG in filed 72.   Cleared for CT by Dr Dierdre Highman and arrived at CT3 at 2353.Dr Thad Ranger , Neurologist arrived in CT at 2359.  CT read by Dr Thad Ranger at 0006 hrs with no intracranial hemorrhage noted. NIHSS baseline 8 with deficits in minor facial palsy, RUE and RLU, partial sensory deficit on right and extinction on right. Pt states she was to have carotid doppler studies on 11/22/13.  VSS 134/70 67 SR 18RR with 100% sats on 2l [place] To CTA from 0056-0125.  Code stroke cancelled at 0235 per Dr Thad Ranger and Dr Dierdre Highman.  Pt ambulating in ED and discharged to home with husband.

## 2013-11-20 NOTE — ED Notes (Signed)
Pt arrival at 2352, pt represents rt side weakness and slurred speech seen by Dr Dellia Cloud

## 2013-11-20 NOTE — ED Notes (Signed)
Code stroke call off, patient to be discharge per dr. Thad Ranger and dr.opitz

## 2013-11-28 ENCOUNTER — Encounter: Payer: Self-pay | Admitting: Neurology

## 2013-11-28 ENCOUNTER — Ambulatory Visit (INDEPENDENT_AMBULATORY_CARE_PROVIDER_SITE_OTHER): Payer: Medicare Other | Admitting: Neurology

## 2013-11-28 VITALS — BP 110/90 | HR 60 | Ht 66.5 in | Wt 160.0 lb

## 2013-11-28 DIAGNOSIS — G459 Transient cerebral ischemic attack, unspecified: Secondary | ICD-10-CM

## 2013-11-28 HISTORY — DX: Transient cerebral ischemic attack, unspecified: G45.9

## 2013-11-28 NOTE — Progress Notes (Signed)
Reason for visit: TIA  Donna Evans is a 67 y.o. female  History of present illness:  Donna Evans is a 67 year old right-handed white female with a history of fibromyalgia. The patient has a prior history of migraine headaches as well, but she has never had episodes of numbness or weakness with her headache. The patient recently has experienced episodes of sharp transient pains that migrate around the head consistent with "ice pick pains". On 11/19/2013 at 10 PM, the patient was noted to have onset of right facial droop, slurred speech, and right-sided weakness. The patient had difficulty walking, with a staggering gait, and she could not lift her right arm up. The deficits lasted about 8 hours, and then cleared, although the patient indicates that even now, she feels as if her right side is not as strong as her left. The patient denies any definite visual field complaints. The patient had some cognitive slowing, and she also developed a headache with the event. The patient went to the emergency room as a code stroke, and a CT scan of the brain was done and showed no acute changes. A CT angiogram of the head and neck was done, and this was unremarkable. The patient has been on aspirin taking the 81 mg tablets, 2 tablets daily since that time. The patient was not on aspirin prior to the onset of the event above. The patient indicates that her migraine headaches are relatively infrequent, occurring once every several months. Sixty-seven years ago, the patient had an event of amnesia lasting about 24 hours consistent with transient global amnesia. The patient did not seek medical attention around that time. The patient comes to this office for an evaluation.  Past Medical History  Diagnosis Date  . Breast cancer   . Fibromyalgia   . Peripheral neuropathy   . Low back pain   . TIA (transient ischemic attack) 11/28/2013  . Transient global amnesia 2011  . Migraine headache   . Foot fracture    Bilateral  . Leg fracture, right     Past Surgical History  Procedure Laterality Date  . Bilateral total mastectomy with axillary lymph node dissection  1990  . Appendectomy  1957  . Cholecystectomy  1979  . Back surgery  1989  . Abdominal hysterectomy  1981  . Liver biopsy  2011    Family History  Problem Relation Age of Onset  . Atrial fibrillation Mother   . Atrial fibrillation Sister   . Cancer Sister   . Diabetes Sister   . Diabetes Father   . Cancer Sister   . Diabetes Sister   . Atrial fibrillation Sister     Social history:  reports that she has quit smoking. Her smoking use included Cigarettes. She has a 40 pack-year smoking history. She has never used smokeless tobacco. She reports that she drinks alcohol. She reports that she does not use illicit drugs.  Medications:  Current Outpatient Prescriptions on File Prior to Visit  Medication Sig Dispense Refill  . ALPRAZolam (XANAX) 1 MG tablet Take 1 tablet by mouth 4 (four) times daily.      . butorphanol (STADOL) 10 MG/ML nasal spray Place 1 spray into the nose every 4 (four) hours as needed for headache.       . gabapentin (NEURONTIN) 300 MG capsule Take 1 capsule by mouth 3 (three) times daily.      Marland Kitchen MICRONIZED COLESTIPOL HCL 1 G tablet Take 1 tablet by mouth 3 (three) times daily.      Marland Kitchen  omeprazole (PRILOSEC) 20 MG capsule Take 1 capsule by mouth daily.      Marland Kitchen zolpidem (AMBIEN) 10 MG tablet Take 10 mg by mouth at bedtime as needed for sleep.        No current facility-administered medications on file prior to visit.      Allergies  Allergen Reactions  . Codeine Other (See Comments)    unknown  . Demerol [Meperidine] Other (See Comments)    Unknown   . Morphine And Related Other (See Comments)    unknown  . Latex Rash    ROS:  Out of a complete 14 system review of symptoms, the patient complains only of the following symptoms, and all other reviewed systems are negative.  Fatigue Swelling in the  legs Skin rash, moles Blurred vision Wheezing, snoring Incontinence of bowel Easy bruising Feeling hot Joint pain, muscle cramps, achy muscles Allergies, skin sensitivity  Confusion, headache, numbness, weakness, slurred speech Restless legs  Blood pressure 110/90, pulse 60, height 5' 6.5" (1.689 m), weight 160 lb (72.576 kg).  Physical Exam  General: The patient is alert and cooperative at the time of the examination.  Head: Pupils are equal, round, and reactive to light. Discs are flat bilaterally.  Neck: The neck is supple, no carotid bruits are noted.  Respiratory: The respiratory examination is clear.  Cardiovascular: The cardiovascular examination reveals a regular rate and rhythm, no obvious murmurs or rubs are noted.  Skin: Extremities are without significant edema.  Neurologic Exam  Mental status: The patient is alert and oriented x 3 at the time of the examination.  Cranial nerves: Facial symmetry is present. There is good sensation of the face to pinprick and soft touch bilaterally. The strength of the facial muscles and the muscles to head turning and shoulder shrug are normal bilaterally. Speech is well enunciated, no aphasia or dysarthria is noted. Extraocular movements are full. Visual fields are full.  Motor: The motor testing reveals 5 over 5 strength of all 4 extremities. Good symmetric motor tone is noted throughout.  Sensory: Sensory testing is intact to pinprick, soft touch, vibration sensation, and position sense on all 4 extremities, with exception that there is some decreased pinprick sensation on the right hand and the right lower extremity. No evidence of extinction is noted.  Coordination: Cerebellar testing reveals good finger-nose-finger and heel-to-shin bilaterally.  Gait and station: Gait is normal. Tandem gait is slightly unsteady. Romberg is negative. No drift is seen.  Reflexes: Deep tendon reflexes are symmetric and normal bilaterally, with  exception that the ankle jerk reflexes are absent bilaterally. Toes are downgoing bilaterally.   Assessment/Plan:  1. TIA event, right-sided weakness and numbness   2. History of migraine headache  3. History of transient global amnesia  The patient has had a CT scan of the brain, but she will be set up for MRI evaluation to determine if a stroke has actually occurred. The patient had symptoms for about 8 hours. The patient is to remain on aspirin, 81 mg daily. The patient will be set up for a 2-D echocardiogram, and she will followup through this office if needed. It is possible that the event may have represented a neurologic migraine.  Marlan Palau MD 11/28/2013 7:59 PM  [facility] [address] [place], [place] [address]  Phone [phone] Fax [phone]

## 2013-11-28 NOTE — Patient Instructions (Signed)

## 2013-12-07 ENCOUNTER — Inpatient Hospital Stay: Admission: RE | Admit: 2013-12-07 | Payer: Medicare Other | Source: Ambulatory Visit

## 2013-12-19 ENCOUNTER — Ambulatory Visit (HOSPITAL_COMMUNITY): Payer: Medicare Other | Attending: Cardiovascular Disease | Admitting: Cardiology

## 2013-12-19 ENCOUNTER — Encounter: Payer: Self-pay | Admitting: Cardiovascular Disease

## 2013-12-19 ENCOUNTER — Telehealth: Payer: Self-pay | Admitting: Neurology

## 2013-12-19 ENCOUNTER — Other Ambulatory Visit (HOSPITAL_COMMUNITY): Payer: Self-pay | Admitting: Neurology

## 2013-12-19 DIAGNOSIS — R609 Edema, unspecified: Secondary | ICD-10-CM | POA: Insufficient documentation

## 2013-12-19 DIAGNOSIS — Z8673 Personal history of transient ischemic attack (TIA), and cerebral infarction without residual deficits: Secondary | ICD-10-CM | POA: Insufficient documentation

## 2013-12-19 DIAGNOSIS — R5381 Other malaise: Secondary | ICD-10-CM | POA: Insufficient documentation

## 2013-12-19 DIAGNOSIS — G459 Transient cerebral ischemic attack, unspecified: Secondary | ICD-10-CM

## 2013-12-19 DIAGNOSIS — R5383 Other fatigue: Secondary | ICD-10-CM

## 2013-12-19 DIAGNOSIS — Z853 Personal history of malignant neoplasm of breast: Secondary | ICD-10-CM | POA: Insufficient documentation

## 2013-12-19 NOTE — Progress Notes (Signed)
Echo performed. 

## 2013-12-19 NOTE — Telephone Encounter (Signed)
I called patient. The 2-D echocardiogram was unremarkable, normal ejection fraction, no source of embolus seen. MRI the brain is pending, to be done tomorrow. I will call the patient once the results are available.

## 2013-12-20 ENCOUNTER — Ambulatory Visit
Admission: RE | Admit: 2013-12-20 | Discharge: 2013-12-20 | Disposition: A | Discharge status: Home / Self Care | Payer: Medicare Other | Source: Ambulatory Visit | Attending: Neurology | Admitting: Neurology

## 2013-12-20 DIAGNOSIS — G459 Transient cerebral ischemic attack, unspecified: Secondary | ICD-10-CM

## 2013-12-24 ENCOUNTER — Telehealth: Payer: Self-pay | Admitting: Neurology

## 2013-12-24 NOTE — Telephone Encounter (Signed)
Patient's husband calling to follow up on the MRI results, patient had MRI last week. Please call the patient.

## 2013-12-24 NOTE — Telephone Encounter (Signed)
I called patient. The MRI the brain shows minimal white matter changes. I discussed this with her. The patient will remain on aspirin. The 2-D echocardiogram was unremarkable.

## 2014-03-21 ENCOUNTER — Ambulatory Visit: Payer: Self-pay | Admitting: Family Medicine

## 2014-03-22 ENCOUNTER — Ambulatory Visit: Payer: Self-pay | Admitting: Family Medicine

## 2014-05-22 ENCOUNTER — Emergency Department: Payer: Self-pay | Admitting: Emergency Medicine

## 2014-08-13 LAB — HM MAMMOGRAPHY: HM MAMMO: NORMAL

## 2014-08-13 LAB — HM DEXA SCAN

## 2014-08-27 ENCOUNTER — Ambulatory Visit: Payer: Self-pay | Admitting: Family Medicine

## 2014-10-01 ENCOUNTER — Emergency Department: Payer: Self-pay | Admitting: Student

## 2014-10-30 ENCOUNTER — Encounter: Payer: Self-pay | Admitting: Neurology

## 2014-11-05 ENCOUNTER — Encounter: Payer: Self-pay | Admitting: Neurology

## 2014-11-20 DIAGNOSIS — Z9013 Acquired absence of bilateral breasts and nipples: Secondary | ICD-10-CM | POA: Insufficient documentation

## 2014-12-02 ENCOUNTER — Encounter: Payer: Self-pay | Admitting: Orthopedic Surgery

## 2014-12-13 ENCOUNTER — Encounter: Payer: Self-pay | Admitting: Orthopedic Surgery

## 2015-01-13 ENCOUNTER — Encounter: Payer: Self-pay | Admitting: Orthopedic Surgery

## 2015-02-11 ENCOUNTER — Encounter
Admit: 2015-02-11 | Disposition: A | Discharge status: Home / Self Care | Payer: Self-pay | Attending: Orthopedic Surgery | Admitting: Orthopedic Surgery

## 2015-03-14 ENCOUNTER — Encounter
Admit: 2015-03-14 | Disposition: A | Discharge status: Home / Self Care | Payer: Self-pay | Attending: Orthopedic Surgery | Admitting: Orthopedic Surgery

## 2015-03-24 ENCOUNTER — Other Ambulatory Visit: Payer: Self-pay | Admitting: Plastic Surgery

## 2015-03-24 DIAGNOSIS — Z1231 Encounter for screening mammogram for malignant neoplasm of breast: Secondary | ICD-10-CM

## 2015-03-24 DIAGNOSIS — Z9882 Breast implant status: Secondary | ICD-10-CM

## 2015-03-26 ENCOUNTER — Ambulatory Visit
Admission: RE | Admit: 2015-03-26 | Discharge: 2015-03-26 | Disposition: A | Discharge status: Home / Self Care | Payer: Medicare Other | Source: Ambulatory Visit | Attending: Plastic Surgery | Admitting: Plastic Surgery

## 2015-03-26 DIAGNOSIS — Z1231 Encounter for screening mammogram for malignant neoplasm of breast: Secondary | ICD-10-CM

## 2015-03-26 DIAGNOSIS — Z9882 Breast implant status: Secondary | ICD-10-CM

## 2015-04-06 NOTE — Consult Note (Signed)
General Aspect the patient is a 69 year old female with history of palpitations and atypical chest pain. She has a history of fibromyalgia, Prinzmetal's angina, Raynaud's phenomena, chronic pain syndrome who presented to the emergency room with complaints of chest discomfort. Patient developed chest pain in her midsternal region. She called the office with complaints of chest pain and was scheduled to come to the clinic however was concerned about progressive pain and she presented to the emergency room. Her electrocardiogram revealed sinus rhythm. She has a history of possible paroxysmal atrial fibrillation. Electrocardiogram done in the emergency room revealed normal sinus rhythm with no injury current or ischemia. She ruled out for a myocardial infarction and was scheduled for consideration for functional study however the patient had persistent chest pain. She had undergone a functional study in August of 2012 which was negative for ischemia. She describes the pain as a tightness around the middle of her abdomen and chest. It is not necessarily worsened with exertion but can be present both with rest and exertion. It is not reproduced with deep palpation. Risk factors include hypertension. Her father died from diabetes in her mother had a CVA. She denies tobacco use. Patient quit smoking several years ago. She currently complains of midsternal chest pain with a tightness around her mid abdomen.   Physical Exam:   GEN WD, WN, NAD    HEENT PERRL    NECK supple    RESP clear BS    CARD Regular rate and rhythm  Normal, S1, S2  No murmur    ABD positive tenderness  no Adominal Mass    LYMPH negative neck, negative axillae    EXTR negative cyanosis/clubbing, negative edema    SKIN normal to palpation    NEURO cranial nerves intact, motor/sensory function intact    PSYCH A+O to time, place, person, anxious   Review of Systems:   Subjective/Chief Complaint midsternal chest pain     General: Fatigue    Skin: No Complaints    ENT: No Complaints    Eyes: No Complaints    Neck: No Complaints    Respiratory: Short of breath    Cardiovascular: Chest pain or discomfort  Tightness    Gastrointestinal: No Complaints    Genitourinary: No Complaints    Vascular: No Complaints    Musculoskeletal: No Complaints    Neurologic: No Complaints    Hematologic: No Complaints    Endocrine: No Complaints    Psychiatric: No Complaints    Review of Systems: All other systems were reviewed and found to be negative    Medications/Allergies Reviewed Medications/Allergies reviewed     Tumor: adrenal gland   lupus:    fibromyalgia:    Other, see comments: cardiac cath   Back Surgery:    Mastectomy:        Admit Diagnosis:   CHEST PAIN ANXIETY FIBROMYALGIA: 29-Feb-2012, Active, CHEST PAIN ANXIETY FIBROMYALGIA      Admit Reason:   Chest pain: (786.50) Active, ICD9, Unspecified chest pain  Home Medications: Medication Instructions Status  Enablex 15 mg oral tablet, extended release 1 tab(s) orally once a day  Active  Ambien 10 mg oral tablet 1 tab(s) orally once a day (at bedtime), As Needed Active  Xanax 1 mg oral tablet 2 tab(s) orally 3 times a day, As Needed Active  Stadol NS 1  nasal every 3 hours, As Needed Active  Benadryl Allergy 12.5 mg/5 mL oral liquid 20 milliliter(s) orally 3 to 4 times a day,  As Needed take with stadol dose Active  gabapentin 500 milligram(s) orally 3 times a day -pt states she is using samples given by dr. unsure of strength. spouse will bring in meds Active  amlodipine 2.5 mg oral tablet 1 tab(s) orally once a day Active   EKG:   EKG NSR    Abnormal NSSTTW changes    Latex: Other  Morphine: Unknown  Demerol: Unknown  Codeine: Itching, Rash, Hives  Cipro: Itching, Rash, Hives  Contrast dye: Itching, Rash, Hives    Impression 69 year old female with history of a diagnosis of Prinzmetal's angina, fibromyalgia, COPD,  chronic pain syndrome who presents with a complaint of her previous history of irregular heartbeat and paroxysmal atrial fibrillation. She presented to the merger with chest pain. Ruled out for myocardial infarction however continue to have rest chest pain. Electrocardiogram revealed no injury current. She had had a negative functional study in August of 2012. She continues to have rest chest pain. Based on her continued rest chest pain and her negative functional study less than one year ago, she was felt to be a candidate for invasive cardiac evaluation to evaluate for evidence of significant coronary artery disease. Risk and benefits a left cardiac catheterization were explained to the patient. She does have a history of IV contrast allergy and will need to be premedicated with IV Solu-Medrol and Benadryl prior to the procedure.    Plan 1. Continue with current medications 2. Premedicate with IV Solu-Medrol and Benadryl. We'll also use ranitidine preop. 3. Proceed left cardiac catheterization evaluate coronary anatomy. We'll make further recommendations after catheterization is completed   Electronic Signatures: Teodoro Spray (MD)  (Signed 19-Mar-13 21:45)  Authored: General Aspect/Present Illness, History and Physical Exam, Review of System, Past Medical History, Health Issues, Home Medications, EKG , Allergies, Impression/Plan   Last Updated: 19-Mar-13 21:45 by Teodoro Spray (MD)

## 2015-04-06 NOTE — H&P (Signed)
PATIENT NAME:  Donna Evans, Donna Evans MR#:  [id number] DATE OF BIRTH:  11-Aug-1946  DATE OF ADMISSION:  02/28/2012  PRIMARY CARE PHYSICIAN: Pernell Dupre, MD    ER REFERRING PHYSICIAN: Pollie Friar, MD  CARDIOLOGIST: Bartholome Bill, MD   CHIEF COMPLAINT: Chest pain.   HISTORY OF PRESENT ILLNESS: The patient is a 69 year old female with past medical history of anxiety, fibromyalgia, who has had a catheterization in March 27, 2003 which showed normal coronaries. The patient was in her usual state of health until about 2:30 p.m. this morning when she woke up with sudden onset of severe retrosternal chest pain described as squeezing pressure-like which radiated into her back, across her chest, in her upper extremities, neck and tongue. The patient to a nitroglycerin, followed by another nitroglycerin 15 minutes later which relieved her pain. She went back to bed, however, woke up around 5:00 with the same kind of chest pain. She took another 2 tablets of nitroglycerin and called Dr. Bethanne Ginger office, who recommended that she come to the Emergency Room.   ALLERGIES: Cipro, contrast dye, codeine, Latex, morphine, Demerol.   CURRENT MEDICATIONS:  1. Ambien 10 mg at bedtime p.r.n.  2. Benadryl 12.5 mg/5 mL, 20 mL 3 to 4 times daily to be taken with Stadol dose.  3. Enablex 50 mg daily.  4. Neurontin 500 mg t.i.d.  5. Stadol NS 1 spray every 3 hours p.r.n.  6. Xanax 1 mg, 2 tablets t.i.d. as needed.   PAST MEDICAL HISTORY:  1. Fibromyalgia. 2. Anxiety, which developed after the patient's only child died in March 26, 2004.  3. She has had a negative cardiac catheterization in 07/2003 and a stress test last year in Dr. Bethanne Ginger office which was normal.  PAST SURGICAL HISTORY:  1. Liver biopsy. 2. Back surgery. 3. Bilateral mastectomies. 4. Cholecystectomy. 5. Appendectomy. 6. Hysterectomy.   SOCIAL HISTORY: The patient quit smoking five years ago. She denies any alcohol use or drug abuse. She is married and lives with her  husband.   FAMILY HISTORY: Mother had heart disease, died of myocardial infarction. Grandmother died of myocardial infarction. One sister died of cancer. She also had atrial fibrillation. Another sister has diabetes and atrial fibrillation. Father had diabetes.    REVIEW OF SYSTEMS: CONSTITUTIONAL: Denies any fever or fatigue. EYES: Denies any blurred or double vision. ENT: Denies any tinnitus or ear pain. RESPIRATORY: Denies any cough or wheezing. CARDIOVASCULAR: Denies any chest pain, palpitations. GI: Denies any nausea, vomiting, diarrhea, or abdominal pain. GU: Denies any dysuria, hematuria. ENDOCRINE: Denies any polyuria or nocturia. HEME/LYMPH: Denies any anemia, easy bruisability. INTEGUMENTARY: Has a rash for which she has seen two dermatologists. MUSCULOSKELETAL: Denies any swelling or gout. NEUROLOGICAL: Denies any numbness or weakness. PSYCHIATRIC: Has a history of anxiety.   PHYSICAL EXAMINATION:  VITAL SIGNS: Temperature 97.6, heart rate 72, respiratory rate 26, blood pressure 122/53, pulse oximetry 100%.   GENERAL: The patient is a 69 year old female lying comfortably in bed, not in acute distress.   HEENT: Head: Atraumatic, normocephalic. EYES: No pallor, icterus, or cyanosis. Pupils are equal, round and reactive to light and accommodation. Extraocular movements are intact. ENT: Wet mucous membranes. No oropharyngeal erythema or thrush.   NECK: Supple. No masses. No JVD. No thyromegaly or lymphadenopathy.   CHEST WALL: No tenderness to palpation. Not using accessory muscles of respiration. No intercostal muscle retractions.   LUNGS: Bilaterally clear to auscultation. No wheezing, rales, or rhonchi.   CARDIOVASCULAR: S1, S2 regular. No murmurs, rubs, or gallops.  ABDOMEN: Soft, nontender, nondistended. No guarding. No rigidity. No organomegaly. Normal bowel sounds.   SKIN: The patient has a raised maculopapular rash on her lower extremities for which she has seen a  dermatologist. She is currently not on any treatment for it. no other rashes or lesions.  PERIPHERIES: No pedal edema, 2+ pedal pulses.   MUSCULOSKELETAL: No cyanosis, clubbing.   NEUROLOGICAL: Awake, alert, oriented x3. Nonfocal neurological exam. Cranial nerves grossly intact.   PSYCHIATRIC: Normal mood and affect.   LABORATORY, DIAGNOSTIC AND RADIOLOGICAL DATA:  Chest x-ray shows no acute abnormalities.  CBC is normal.  Complete metabolic panel: BUN 24.  Cardiac enzymes negative.  EKG shows no acute changes.   ASSESSMENT AND PLAN: A 69 year old female with past medical history of fibromyalgia and anxiety, presents with retrosternal chest pain. She has had a prior catheterization in 2004 which showed normal coronaries. She also had a stress test last year with Dr. Ubaldo Glassing which was normal.   1. Chest pain: Could be related to anxiety. We will admit the patient to a telemetry bed, check serial cardiac enzymes, start the patient on aspirin and low-dose beta blocker. We will obtain a Cardiology consultation and inpatient stress test.  2. Mild dehydration with elevated BUN of 24: We will hydrate with IV fluids.  3. Anxiety: We will continue p.r.n. Xanax.  4. History of fibromyalgia: We will continue Neurontin. We will place on GI and deep vein thrombosis prophylaxis.  I reviewed old medical records, discussed with the ED physician, discussed with the patient and her husband the plan of care and management.   TIME SPENT: 75 minutes.   ____________________________ Cherre Huger, MD sp:cbb D: 02/28/2012 12:55:44 ET T: 02/28/2012 15:18:52 ET JOB#: [id number]  cc: Cherre Huger, MD, <Dictator> Venetia Maxon. Elijio Miles, MD Cherre Huger MD ELECTRONICALLY SIGNED 03/03/2012 13:37

## 2015-04-25 ENCOUNTER — Telehealth: Payer: Self-pay | Admitting: Gynecologic Oncology

## 2015-04-25 NOTE — Telephone Encounter (Signed)
Informed patient of results of VIN3. Margin positive for mild dysplasia, but this does not require re-excision. I explained that there is a high risk for recurrence of this process and she requires close followup with 6 monthly examinations.  Donaciano Eva, MD

## 2015-04-28 LAB — LIPID PANEL
Cholesterol: 203 mg/dL — AB (ref 0–200)
HDL: 59 mg/dL (ref 35–70)
LDL Cholesterol: 116 mg/dL
TRIGLYCERIDES: 141 mg/dL (ref 40–160)

## 2015-04-29 ENCOUNTER — Encounter: Payer: Self-pay | Admitting: Nurse Practitioner

## 2015-04-29 ENCOUNTER — Ambulatory Visit (INDEPENDENT_AMBULATORY_CARE_PROVIDER_SITE_OTHER): Payer: Medicare Other | Admitting: Nurse Practitioner

## 2015-04-29 VITALS — BP 119/79 | HR 65 | Ht 65.5 in | Wt 162.0 lb

## 2015-04-29 DIAGNOSIS — G459 Transient cerebral ischemic attack, unspecified: Secondary | ICD-10-CM

## 2015-04-29 DIAGNOSIS — G8190 Hemiplegia, unspecified affecting unspecified side: Secondary | ICD-10-CM | POA: Diagnosis not present

## 2015-04-29 DIAGNOSIS — Z5181 Encounter for therapeutic drug level monitoring: Secondary | ICD-10-CM | POA: Diagnosis not present

## 2015-04-29 DIAGNOSIS — F05 Delirium due to known physiological condition: Secondary | ICD-10-CM | POA: Diagnosis not present

## 2015-04-29 NOTE — Progress Notes (Signed)
I have read the note, and I agree with the clinical assessment and plan.  WILLIS,CHARLES KEITH   

## 2015-04-29 NOTE — Progress Notes (Signed)
[facility]  PATIENT: Donna Evans DOB: [birth year]   REASON FOR VISIT: Follow-up for history of fibromyalgia, history of migraines and questionable TIA , recent acute confusional state HISTORY FROM: Patient, husband    HISTORY OF PRESENT ILLNESS: Donna Evans, 69 year old female returns for follow-up. She was last seen in this office by Dr. Jannifer Franklin 11/28/2013. At that time she had episodes of sharp transient pains that migrate around the head consistent with "ice pick pains". On 11/19/2013 at 10 PM, the patient was noted to have onset of right facial droop, slurred speech, and right-sided weakness. The patient had difficulty walking, with a staggering gait, and she could not lift her right arm up. The deficits lasted about 8 hours, and then cleared. MRI of the brain at that time with white matter changes nothing acute. She was asked to begin  aspirin. 2-D echo was unremarkable. She had an episode a week ago when she was driving to the drugstore, got confused had a headache blurred vision in both eyes and bitemporal soreness. No weakness. The episode lasted about 10 minutes.She has not had further events . She returns for reevaluation  HISTORY: Donna Evans is a 69 year old right-handed white female with a history of fibromyalgia. The patient has a prior history of migraine headaches as well, but she has never had episodes of numbness or weakness with her headache. The patient recently has experienced episodes of sharp transient pains that migrate around the head consistent with "ice pick pains". On 11/19/2013 at 10 PM, the patient was noted to have onset of right facial droop, slurred speech, and right-sided weakness. The patient had difficulty walking, with a staggering gait, and she could not lift her right arm up. The deficits lasted about 8 hours, and then cleared, although the patient indicates that even now, she feels as if her right side is not as strong as her left. The  patient denies any definite visual field complaints. The patient had some cognitive slowing, and she also developed a headache with the event. The patient went to the emergency room as a code stroke, and a CT scan of the brain was done and showed no acute changes. A CT angiogram of the head and neck was done, and this was unremarkable. The patient has been on aspirin taking the 81 mg tablets, 2 tablets daily since that time. The patient was not on aspirin prior to the onset of the event above. The patient indicates that her migraine headaches are relatively infrequent, occurring once every several months. Three years ago, the patient had an event of amnesia lasting about 24 hours consistent with transient global amnesia. The patient did not seek medical attention around that time.    REVIEW OF SYSTEMS: Full 14 system review of systems performed and notable only for those listed, all others are neg:  Constitutional:  fatigue Cardiovascular : neg Ear/Nose/Throat: neg  Skin: neg Eyes: blurred vision Respiratory : neg Gastroitestinal: neg  Hematology/Lymphatic: neg  Endocrine: neg Musculoskeletal:neg Allergy/Immunology: neg Neurological:  memory loss, headache  Psychiatric neg Sleep :  snoring   ALLERGIES: Allergies  Allergen Reactions  . Codeine Other (See Comments)    unknown  . Demerol [Meperidine] Other (See Comments)    Unknown   . Morphine And Related Other (See Comments)    unknown  . Latex Rash    HOME MEDICATIONS: Outpatient Prescriptions Prior to Visit  Medication Sig Dispense Refill  . ALPRAZolam (XANAX) 1 MG tablet Take 1 tablet by mouth  4 (four) times daily.    Marland Kitchen aspirin EC 81 MG tablet Take 81 mg by mouth daily.    . butorphanol (STADOL) 10 MG/ML nasal spray Place 1 spray into the nose every 4 (four) hours as needed for headache.     . cyclobenzaprine (FLEXERIL) 10 MG tablet Take 10 mg by mouth 3 (three) times daily as needed for muscle spasms.    Marland Kitchen gabapentin  (NEURONTIN) 300 MG capsule Take 1 capsule by mouth 4 (four) times daily.     Marland Kitchen lidocaine (LIDODERM) 5 % Place 1 patch onto the skin daily.    Marland Kitchen torsemide (DEMADEX) 20 MG tablet Take 20 mg by mouth daily as needed.     . zolpidem (AMBIEN) 10 MG tablet Take 10 mg by mouth at bedtime as needed for sleep.     Marland Kitchen amLODipine (NORVASC) 2.5 MG tablet Take 2.5 mg by mouth daily.    Marland Kitchen amoxicillin (AMOXIL) 500 MG capsule Take 500 mg by mouth daily as needed.     . ibandronate (BONIVA) 150 MG tablet Take 150 mg by mouth daily.    Marland Kitchen MICRONIZED COLESTIPOL HCL 1 G tablet Take 1 tablet by mouth 3 (three) times daily.    Marland Kitchen omeprazole (PRILOSEC) 20 MG capsule Take 1 capsule by mouth daily.     No facility-administered medications prior to visit.    PAST MEDICAL HISTORY: Past Medical History  Diagnosis Date  . Breast cancer   . Fibromyalgia   . Peripheral neuropathy   . Low back pain   . TIA (transient ischemic attack) 11/28/2013  . Transient global amnesia 2011  . Migraine headache   . Foot fracture     Bilateral  . Leg fracture, right     PAST SURGICAL HISTORY: Past Surgical History  Procedure Laterality Date  . Bilateral total mastectomy with axillary lymph node dissection  1990  . Appendectomy  1957  . Cholecystectomy  1979  . Back surgery  1989  . Abdominal hysterectomy  1981  . Liver biopsy  2011    FAMILY HISTORY: Family History  Problem Relation Age of Onset  . Atrial fibrillation Mother   . Atrial fibrillation Sister   . Cancer Sister   . Diabetes Sister   . Diabetes Father   . Cancer Sister   . Diabetes Sister   . Atrial fibrillation Sister     SOCIAL HISTORY: History   Social History  . Marital Status: Married    Spouse Name: N/A  . Number of Children: 1  . Years of Education: college3   Occupational History  . Retired    Social History Main Topics  . Smoking status: Former Smoker -- 1.00 packs/day for 40 years    Types: Cigarettes  . Smokeless tobacco: Never  Used     Comment: 9 years  . Alcohol Use: 0.0 oz/week    0 Standard drinks or equivalent per week     Comment: rare  . Drug Use: No  . Sexual Activity: Not on file   Other Topics Concern  . Not on file   Social History Narrative     PHYSICAL EXAM  Filed Vitals:   04/29/15 1314  BP: 119/79  Pulse: 65  Height: 5' 5.5" (1.664 m)  Weight: 162 lb (73.483 kg)   Body mass index is 26.54 kg/(m^2). General: The patient is alert and cooperative at the time of the examination. Head: normocephalic and atraumatic. Oropharynx benign Neck: The neck is supple, no carotid bruits  are noted. Respiratory: The respiratory examination is clear. Cardiovascular: The cardiovascular examination reveals a regular rate and rhythm, no obvious murmurs or rubs are noted. Skin: Extremities are without significant edema.  Neurologic Exam  Mental status: The patient is alert and oriented x 3 at the time of the examination.  Cranial nerves: Pupils are equal, round, and reactive to light. Discs are flat bilaterally.Extraocular movements are full. Visual fields are full. Facial symmetry is present. There is good sensation of the face to pinprick and soft touch bilaterally. The strength of the facial muscles and the muscles to head turning and shoulder shrug are normal bilaterally. Speech is well enunciated, no aphasia or dysarthria is noted. Motor: The motor testing reveals 5 over 5 strength of all 4 extremities. Good symmetric motor tone is noted throughout. No focal weakness Sensory: Sensory testing is intact to pinprick, soft touch, vibration sensation, and position sense on all 4 extremities, No evidence of extinction is noted. Coordination: Cerebellar testing reveals good finger-nose-finger and heel-to-shin bilaterally. Gait and station: Gait is normal. Tandem gait is slightly unsteady. Romberg is negative. No drift is seen. Reflexes: Deep tendon reflexes are symmetric and normal bilaterally, with exception  that the ankle jerk reflexes are absent bilaterally. Toes are downgoing bilaterally.    DIAGNOSTIC DATA (LABS, IMAGING, TESTING) -ASSESSMENT AND PLAN  69 y.o. year old female  has a past medical history of  Fibromyalgia; Peripheral neuropathy; Low back pain; TIA (transient ischemic attack) (11/28/2013); Transient global amnesia (2011); Migraine headache; and recent episode of acute confusion here to follow-up.   Discussed with Dr. Jannifer Franklin Will get another MRI of the brain  Continue aspirin 0.81 daily EEG to be scheduled No driving at present BMP today Follow-up in 3 months Donna Bible, [facility], [facility], [facility] [address] [place], [place] [address] [phone]

## 2015-04-29 NOTE — Patient Instructions (Addendum)
Will get another MRI of the brain  EEG to be scheduled BMP today Follow-up in 3 months

## 2015-04-30 ENCOUNTER — Telehealth: Payer: Self-pay

## 2015-04-30 LAB — BASIC METABOLIC PANEL
BUN / CREAT RATIO: 25 (ref 11–26)
BUN: 24 mg/dL (ref 8–27)
CALCIUM: 9.4 mg/dL (ref 8.7–10.3)
CO2: 23 mmol/L (ref 18–29)
CREATININE: 0.96 mg/dL (ref 0.57–1.00)
Chloride: 102 mmol/L (ref 97–108)
GFR calc Af Amer: 70 mL/min/{1.73_m2} (ref >59)
GFR calc non Af Amer: 61 mL/min/{1.73_m2} (ref >59)
Glucose: 91 mg/dL (ref 65–99)
Potassium: 4.6 mmol/L (ref 3.5–5.2)
Sodium: 141 mmol/L (ref 134–144)

## 2015-04-30 NOTE — Telephone Encounter (Deleted)
-----   Message from Dennie Bible, NP sent at 04/30/2015  9:13 AM EDT ----- Labs normal please call patient

## 2015-04-30 NOTE — Telephone Encounter (Signed)
Patient called to schedule three month follow up (30 min) appointment with Cecille Rubin

## 2015-04-30 NOTE — Telephone Encounter (Signed)
Normal labs Results called to patient

## 2015-05-13 ENCOUNTER — Ambulatory Visit (INDEPENDENT_AMBULATORY_CARE_PROVIDER_SITE_OTHER): Payer: Medicare Other | Admitting: Neurology

## 2015-05-13 ENCOUNTER — Telehealth: Payer: Self-pay | Admitting: Neurology

## 2015-05-13 ENCOUNTER — Ambulatory Visit
Admission: RE | Admit: 2015-05-13 | Discharge: 2015-05-13 | Disposition: A | Discharge status: Home / Self Care | Payer: Medicare Other | Source: Ambulatory Visit | Attending: Nurse Practitioner | Admitting: Nurse Practitioner

## 2015-05-13 DIAGNOSIS — F05 Delirium due to known physiological condition: Secondary | ICD-10-CM

## 2015-05-13 DIAGNOSIS — G459 Transient cerebral ischemic attack, unspecified: Secondary | ICD-10-CM

## 2015-05-13 MED ORDER — GADOBENATE DIMEGLUMINE 529 MG/ML IV SOLN
15.0000 mL | Freq: Once | INTRAVENOUS | Status: AC | PRN
  Administered 2015-05-13: 15 mL via INTRAVENOUS

## 2015-05-13 NOTE — Procedures (Signed)
    History:  Donna Evans is a 69 year old patient with a history of an event of transient confusion and headache that occurred around 04/22/2015. The patient is being evaluated for this episode of confusion.  This is a routine EEG. No skull defects are noted. Medications include alprazolam, aspirin, Stadol, Flexeril, gabapentin, hydrochlorothiazide, Demadex, and Ambien.   EEG classification: Normal awake  Description of the recording: The background rhythms of this recording consists of a fairly well modulated medium amplitude alpha rhythm of 9 Hz that is reactive to eye opening and closure. As the record progresses, the patient appears to remain in the waking state throughout the recording. Photic stimulation was performed, resulting in a bilateral and symmetric photic driving response. Hyperventilation was also performed, resulting in a minimal buildup of the background rhythm activities without significant slowing seen. At no time during the recording does there appear to be evidence of spike or spike wave discharges or evidence of focal slowing. EKG monitor shows no evidence of cardiac rhythm abnormalities with a heart rate of 66.  Impression: This is a normal EEG recording in the waking state. No evidence of ictal or interictal discharges are seen.

## 2015-05-13 NOTE — Telephone Encounter (Signed)
I called the patient. The EEG study was unremarkable. MRI the brain has been done, the formal report is not yet available. I will to the study, it shows some mild chronic small vessel changes, I do not see any evidence of an acute stroke. It is likely okay for the patient to return to driving. Again, the formal MRI report is still pending.

## 2015-05-14 ENCOUNTER — Telehealth: Payer: Self-pay

## 2015-05-14 NOTE — Telephone Encounter (Signed)
Patient was informed of normal EEG results she has no questions at this time.

## 2015-05-14 NOTE — Telephone Encounter (Signed)
    Patient was informed that MRI showed no acute findings and no change form Jan 2015.  She has no questions at this time

## 2015-05-29 ENCOUNTER — Other Ambulatory Visit: Payer: Self-pay | Admitting: Family Medicine

## 2015-06-17 ENCOUNTER — Encounter: Payer: Self-pay | Admitting: Family Medicine

## 2015-06-17 ENCOUNTER — Ambulatory Visit (INDEPENDENT_AMBULATORY_CARE_PROVIDER_SITE_OTHER): Payer: Medicare Other | Admitting: Family Medicine

## 2015-06-17 ENCOUNTER — Encounter (INDEPENDENT_AMBULATORY_CARE_PROVIDER_SITE_OTHER): Payer: Self-pay

## 2015-06-17 VITALS — BP 128/64 | HR 92 | Temp 98.3°F | Resp 16 | Ht 66.0 in | Wt 163.8 lb

## 2015-06-17 DIAGNOSIS — F419 Anxiety disorder, unspecified: Secondary | ICD-10-CM

## 2015-06-17 DIAGNOSIS — G459 Transient cerebral ischemic attack, unspecified: Secondary | ICD-10-CM | POA: Diagnosis not present

## 2015-06-17 DIAGNOSIS — E039 Hypothyroidism, unspecified: Secondary | ICD-10-CM | POA: Diagnosis not present

## 2015-06-17 DIAGNOSIS — G8929 Other chronic pain: Secondary | ICD-10-CM | POA: Diagnosis not present

## 2015-06-18 DIAGNOSIS — Z9889 Other specified postprocedural states: Secondary | ICD-10-CM | POA: Insufficient documentation

## 2015-06-18 DIAGNOSIS — Z9882 Breast implant status: Secondary | ICD-10-CM

## 2015-06-22 NOTE — Patient Instructions (Signed)
Follow up in 4 months 

## 2015-06-22 NOTE — Progress Notes (Signed)
As per prior notes

## 2015-07-15 ENCOUNTER — Ambulatory Visit: Payer: Self-pay | Admitting: Nurse Practitioner

## 2015-07-28 ENCOUNTER — Encounter: Payer: Self-pay | Admitting: Family Medicine

## 2015-07-28 ENCOUNTER — Ambulatory Visit (INDEPENDENT_AMBULATORY_CARE_PROVIDER_SITE_OTHER): Payer: Medicare Other | Admitting: Family Medicine

## 2015-07-28 ENCOUNTER — Other Ambulatory Visit: Payer: Self-pay | Admitting: Family Medicine

## 2015-07-28 VITALS — BP 110/70 | HR 95 | Temp 98.4°F | Resp 16 | Ht 66.0 in | Wt 161.3 lb

## 2015-07-28 DIAGNOSIS — R112 Nausea with vomiting, unspecified: Secondary | ICD-10-CM | POA: Insufficient documentation

## 2015-07-28 DIAGNOSIS — N3001 Acute cystitis with hematuria: Secondary | ICD-10-CM

## 2015-07-28 DIAGNOSIS — N390 Urinary tract infection, site not specified: Secondary | ICD-10-CM | POA: Insufficient documentation

## 2015-07-28 DIAGNOSIS — R319 Hematuria, unspecified: Secondary | ICD-10-CM

## 2015-07-28 DIAGNOSIS — R310 Gross hematuria: Secondary | ICD-10-CM | POA: Insufficient documentation

## 2015-07-28 LAB — POCT URINALYSIS DIPSTICK
Bilirubin, UA: NEGATIVE
Glucose, UA: NEGATIVE
Nitrite, UA: NEGATIVE
PH UA: 5
PROTEIN UA: 100
Spec Grav, UA: 1.02
Urobilinogen, UA: 0.2

## 2015-07-28 NOTE — Progress Notes (Signed)
Name: Donna Evans   MRN: [id number]    DOB: October 14, 1946   Date:07/28/2015       Progress Note  Subjective  Chief Complaint  Chief Complaint  Patient presents with  . Urinary Tract Infection  . Leg Swelling     right leg    Urinary Tract Infection  This is a new problem. The current episode started in the past 7 days. The problem occurs every urination. The problem has been gradually worsening. The quality of the pain is described as burning. The pain is moderate. There has been no fever. She is sexually active. There is no history of pyelonephritis. Associated symptoms include chills, flank pain, frequency, hematuria, nausea, urgency and vomiting. Pertinent negatives include no discharge. She has tried antibiotics for the symptoms. The treatment provided no relief. Her past medical history is significant for kidney stones.      Past Medical History  Diagnosis Date  . Breast cancer   . Fibromyalgia   . Peripheral neuropathy   . Low back pain   . TIA (transient ischemic attack) 11/28/2013  . Transient global amnesia 2011  . Migraine headache   . Foot fracture     Bilateral  . Leg fracture, right     Social History  Substance Use Topics  . Smoking status: Former Smoker -- 1.00 packs/day for 40 years    Types: Cigarettes  . Smokeless tobacco: Never Used     Comment: 9 years  . Alcohol Use: 0.0 oz/week    0 Standard drinks or equivalent per week     Comment: rare     Current outpatient prescriptions:  .  ALPRAZolam (XANAX) 1 MG tablet, TAKE ONE TABLET 3 TIMES DAILY AS NEEDED, Disp: 90 tablet, Rfl: 1 .  aspirin EC 81 MG tablet, Take 81 mg by mouth daily., Disp: , Rfl:  .  butorphanol (STADOL) 10 MG/ML nasal spray, Place 1 spray into the nose every 4 (four) hours as needed for headache. , Disp: , Rfl:  .  butorphanol (STADOL) 10 MG/ML nasal spray, Place into the nose., Disp: , Rfl:  .  cyclobenzaprine (FLEXERIL) 10 MG tablet, Take 10 mg by mouth 3 (three) times daily as  needed for muscle spasms., Disp: , Rfl:  .  gabapentin (NEURONTIN) 300 MG capsule, Take 1 capsule by mouth 4 (four) times daily. , Disp: , Rfl:  .  hydrochlorothiazide (MICROZIDE) 12.5 MG capsule, Take 12.5 mg by mouth 2 (two) times daily., Disp: , Rfl:  .  lidocaine (LIDODERM) 5 %, Place 1 patch onto the skin daily., Disp: , Rfl:  .  torsemide (DEMADEX) 20 MG tablet, Take 20 mg by mouth daily as needed. , Disp: , Rfl:  .  zolpidem (AMBIEN) 10 MG tablet, Take 10 mg by mouth at bedtime as needed for sleep. , Disp: , Rfl:   Allergies  Allergen Reactions  . Ciprofloxacin Hives  . Fluconazole Hives  . Codeine Other (See Comments)    unknown  . Demerol [Meperidine] Other (See Comments)    Unknown   . Duloxetine Hcl   . Flagyl [Metronidazole]   . Hydrocodone-Acetaminophen Hives  . Morphine And Related Other (See Comments)    unknown  . Amoxicillin-Pot Clavulanate Nausea And Vomiting  . Latex Rash  . Sulfamethoxazole-Trimethoprim Rash  . Valacyclovir Rash    Review of Systems  Constitutional: Positive for chills. Negative for fever and weight loss.  HENT: Negative for congestion, hearing loss, sore throat and tinnitus.   Eyes:  Negative for blurred vision, double vision and redness.  Respiratory: Negative for cough, hemoptysis and shortness of breath.   Cardiovascular: Negative for chest pain, palpitations, orthopnea, claudication and leg swelling.  Gastrointestinal: Positive for nausea and vomiting. Negative for heartburn, diarrhea, constipation and blood in stool.  Genitourinary: Positive for urgency, frequency, hematuria and flank pain. Negative for dysuria.  Musculoskeletal: Negative for myalgias, back pain, joint pain, falls and neck pain.  Skin: Negative for itching.  Neurological: Negative for dizziness, tingling, tremors, focal weakness, seizures, loss of consciousness, weakness and headaches.  Endo/Heme/Allergies: Does not bruise/bleed easily.  Psychiatric/Behavioral: Negative  for depression and substance abuse. The patient is not nervous/anxious and does not have insomnia.      Objective  Filed Vitals:   07/28/15 1456  BP: 110/70  Pulse: 95  Temp: 98.4 F (36.9 C)  TempSrc: Oral  Resp: 16  Height: 5\' 6"  (1.676 m)  Weight: 161 lb 4.8 oz (73.165 kg)  SpO2: 95%     Physical Exam  Constitutional: She is oriented to person, place, and time and well-developed, well-nourished, and in no distress.  HENT:  Head: Normocephalic.  Eyes: EOM are normal. Pupils are equal, round, and reactive to light.  Neck: Normal range of motion. No thyromegaly present.  Cardiovascular: Normal rate, regular rhythm and normal heart sounds.   No murmur heard. Pulmonary/Chest: Effort normal and breath sounds normal.  Abdominal: Soft. Bowel sounds are normal. Tenderness: (flank and suprapubic tenderness noted.  Musculoskeletal: Normal range of motion. She exhibits no edema.  Neurological: She is alert and oriented to person, place, and time. No cranial nerve deficit. Gait normal.  Skin: Skin is warm and dry. No rash noted.  Psychiatric: Memory and affect normal.      Assessment & Plan

## 2015-07-28 NOTE — Addendum Note (Signed)
Addended by: Burnie Therien, Ulla Potash on: 07/28/2015 03:37 PM   Modules accepted: Orders

## 2015-07-30 LAB — URINE CULTURE: ORGANISM ID, BACTERIA: NO GROWTH

## 2015-07-31 ENCOUNTER — Telehealth: Payer: Self-pay | Admitting: Emergency Medicine

## 2015-07-31 NOTE — Progress Notes (Signed)
Name: Donna Evans   MRN: [id number]    DOB: [birth year]   Date:07/31/2015       Progress Note  Subjective  Chief Complaint  Chief Complaint  Patient presents with  . Urinary Tract Infection  . Leg Swelling     right leg    HPI  Urinary tract infection  Patient presents with 3-4 day history of severe dysuria as well as hematuria and suprapubic and lower back discomfort. She's felt chills but no documented fever. There is no history of recurrent UTIs.  Past Medical History  Diagnosis Date  . Breast cancer   . Fibromyalgia   . Peripheral neuropathy   . Low back pain   . TIA (transient ischemic attack) 11/28/2013  . Transient global amnesia 2011  . Migraine headache   . Foot fracture     Bilateral  . Leg fracture, right     Social History  Substance Use Topics  . Smoking status: Former Smoker -- 1.00 packs/day for 40 years    Types: Cigarettes  . Smokeless tobacco: Never Used     Comment: 9 years  . Alcohol Use: 0.0 oz/week    0 Standard drinks or equivalent per week     Comment: rare     Current outpatient prescriptions:  .  ALPRAZolam (XANAX) 1 MG tablet, TAKE ONE TABLET 3 TIMES DAILY AS NEEDED, Disp: 90 tablet, Rfl: 1 .  aspirin EC 81 MG tablet, Take 81 mg by mouth daily., Disp: , Rfl:  .  butorphanol (STADOL) 10 MG/ML nasal spray, Place 1 spray into the nose every 4 (four) hours as needed for headache. , Disp: , Rfl:  .  butorphanol (STADOL) 10 MG/ML nasal spray, Place into the nose., Disp: , Rfl:  .  butorphanol (STADOL) 10 MG/ML nasal spray, TAKE 1 SPRAY IN EACH NOSTRIL EVERY 3 HOURS AS NEEDED FOR SEVERE PAIN, Disp: 15 mL, Rfl: 0 .  cyclobenzaprine (FLEXERIL) 10 MG tablet, Take 10 mg by mouth 3 (three) times daily as needed for muscle spasms., Disp: , Rfl:  .  gabapentin (NEURONTIN) 300 MG capsule, Take 1 capsule by mouth 4 (four) times daily. , Disp: , Rfl:  .  hydrochlorothiazide (MICROZIDE) 12.5 MG capsule, Take 12.5 mg by mouth 2 (two) times daily., Disp: ,  Rfl:  .  lidocaine (LIDODERM) 5 %, Place 1 patch onto the skin daily., Disp: , Rfl:  .  torsemide (DEMADEX) 20 MG tablet, Take 20 mg by mouth daily as needed. , Disp: , Rfl:  .  zolpidem (AMBIEN) 10 MG tablet, Take 10 mg by mouth at bedtime as needed for sleep. , Disp: , Rfl:   Allergies  Allergen Reactions  . Ciprofloxacin Hives  . Fluconazole Hives  . Codeine Other (See Comments)    unknown  . Demerol [Meperidine] Other (See Comments)    Unknown   . Duloxetine Hcl   . Flagyl [Metronidazole]   . Hydrocodone-Acetaminophen Hives  . Morphine And Related Other (See Comments)    unknown  . Amoxicillin-Pot Clavulanate Nausea And Vomiting  . Latex Rash  . Sulfamethoxazole-Trimethoprim Rash  . Valacyclovir Rash    Review of Systems  Constitutional: Negative.   Genitourinary: Positive for dysuria, urgency, frequency, hematuria and flank pain.     Objective  Filed Vitals:   07/28/15 1456  BP: 110/70  Pulse: 95  Temp: 98.4 F (36.9 C)  TempSrc: Oral  Resp: 16  Height: 5\' 6"  (1.676 m)  Weight: 161 lb 4.8 oz (73.165  kg)  SpO2: 95%     Physical Exam  Constitutional: She is well-developed, well-nourished, and in no distress.  Abdominal: Soft. Bowel sounds are normal. She exhibits no mass. There is tenderness (suprapubic discomfort to palpation). There is no rebound and no guarding.      Assessment & Plan   1. Hematuria  - POCT urinalysis dipstick - Urine Culture  2. Acute cystitis with hematuria  - POCT urinalysis dipstick  3. Nausea and vomiting, vomiting of unspecified type

## 2015-07-31 NOTE — Telephone Encounter (Signed)
Patient notified of urine culture

## 2015-07-31 NOTE — Addendum Note (Signed)
Addended by: Ashok Norris on: 07/31/2015 01:18 PM   Modules accepted: Miquel Dunn

## 2015-08-11 ENCOUNTER — Ambulatory Visit: Payer: Medicare Other | Admitting: Family Medicine

## 2015-08-22 ENCOUNTER — Other Ambulatory Visit: Payer: Self-pay | Admitting: Family Medicine

## 2015-08-27 ENCOUNTER — Encounter: Payer: Self-pay | Admitting: Family Medicine

## 2015-08-27 ENCOUNTER — Ambulatory Visit (INDEPENDENT_AMBULATORY_CARE_PROVIDER_SITE_OTHER): Payer: Medicare Other | Admitting: Family Medicine

## 2015-08-27 VITALS — BP 124/82 | HR 88 | Temp 98.4°F | Resp 14 | Ht 66.0 in | Wt 161.8 lb

## 2015-08-27 DIAGNOSIS — J40 Bronchitis, not specified as acute or chronic: Secondary | ICD-10-CM | POA: Diagnosis not present

## 2015-08-27 DIAGNOSIS — N3001 Acute cystitis with hematuria: Secondary | ICD-10-CM

## 2015-08-27 LAB — POCT URINALYSIS DIPSTICK
BILIRUBIN UA: NEGATIVE
GLUCOSE UA: NEGATIVE
Ketones, UA: NEGATIVE
Nitrite, UA: POSITIVE
SPEC GRAV UA: 1.01
Urobilinogen, UA: 2
pH, UA: 5.5

## 2015-08-27 MED ORDER — AMOXICILLIN 875 MG PO TABS: 875.0000 mg | ORAL_TABLET | Freq: Two times a day (BID) | ORAL | Status: DC

## 2015-08-27 MED ORDER — PHENAZOPYRIDINE HCL 100 MG PO TABS: 100.0000 mg | ORAL_TABLET | Freq: Three times a day (TID) | ORAL | Status: DC | PRN

## 2015-08-27 MED ORDER — TAMSULOSIN HCL 0.4 MG PO CAPS: 0.4000 mg | ORAL_CAPSULE | Freq: Every day | ORAL | Status: DC

## 2015-08-27 MED ORDER — AMOXICILLIN-POT CLAVULANATE 875-125 MG PO TABS: 1.0000 | ORAL_TABLET | Freq: Two times a day (BID) | ORAL | Status: DC

## 2015-08-27 NOTE — Progress Notes (Signed)
Name: Donna Evans   MRN: [id number]    DOB: 1946-03-01   Date:08/27/2015       Progress Note  Subjective  Chief Complaint  Chief Complaint  Patient presents with  . Urinary Tract Infection    for 2 weeks  . Cough    HPI  Bronchitis  Patient presents with a greater than 1 week history of cough productive of purulent sputum. The cough is irritating and keep the patient awake at night. There has no fever or chills.  Over-the-counter meds And completely effective.  She has had the same on several occasion is also had severe pleurisy at times when the bronchitis become severe.  UTI  Patient presents with a 7+ day history of dysuria frequency and urgency.  There has been suprapubic and lower back discomfort.  No fever or chills noted.  There is been no hematuria. There has not been a past history of recurrent UTIs. She also is experiencing hematuria. Chronic pain  Patient has chronic headache pain as well as chronic back pain and is chronic Stadol nasal spray for this     Past Medical History  Diagnosis Date  . Breast cancer   . Fibromyalgia   . Peripheral neuropathy   . Low back pain   . TIA (transient ischemic attack) 11/28/2013  . Transient global amnesia 2011  . Migraine headache   . Foot fracture     Bilateral  . Leg fracture, right     Social History  Substance Use Topics  . Smoking status: Former Smoker -- 1.00 packs/day for 40 years    Types: Cigarettes  . Smokeless tobacco: Never Used     Comment: 9 years  . Alcohol Use: 0.0 oz/week    0 Standard drinks or equivalent per week     Comment: rare     Current outpatient prescriptions:  .  colestipol (COLESTID) 1 G tablet, TAKE ONE TABLET 3 TIMES DAILY BEFORE MEALS, Disp: , Rfl:  .  ALPRAZolam (XANAX) 1 MG tablet, TAKE ONE TABLET 3 TIMES DAILY AS NEEDED, Disp: 90 tablet, Rfl: 1 .  aspirin EC 81 MG tablet, Take 81 mg by mouth daily., Disp: , Rfl:  .  butorphanol (STADOL) 10 MG/ML nasal spray, Place 1 spray  into the nose every 4 (four) hours as needed for headache. , Disp: , Rfl:  .  butorphanol (STADOL) 10 MG/ML nasal spray, Place into the nose., Disp: , Rfl:  .  butorphanol (STADOL) 10 MG/ML nasal spray, TAKE 1 SPRAY IN EACH NOSTRIL EVERY 3 HOURS AS NEEDED FOR SEVERE PAIN, Disp: 15 mL, Rfl: 0 .  butorphanol (STADOL) 10 MG/ML nasal spray, TAKE 1 SPRAY IN EACH NOSTRIL EVERY 3 HOURS AS NEEDED FOR SEVERE PAIN, Disp: 15 mL, Rfl: 2 .  cyclobenzaprine (FLEXERIL) 10 MG tablet, Take 10 mg by mouth 3 (three) times daily as needed for muscle spasms., Disp: , Rfl:  .  gabapentin (NEURONTIN) 300 MG capsule, Take 1 capsule by mouth 4 (four) times daily. , Disp: , Rfl:  .  hydrochlorothiazide (MICROZIDE) 12.5 MG capsule, Take 12.5 mg by mouth 2 (two) times daily., Disp: , Rfl:  .  lidocaine (LIDODERM) 5 %, Place 1 patch onto the skin daily., Disp: , Rfl:  .  torsemide (DEMADEX) 20 MG tablet, Take 20 mg by mouth daily as needed. , Disp: , Rfl:  .  zolpidem (AMBIEN) 10 MG tablet, TAKE ONE TABLET AT BEDTIME, Disp: 30 tablet, Rfl: 2  Allergies  Allergen Reactions  .  Ciprofloxacin Hives  . Fluconazole Hives  . Codeine Other (See Comments)    unknown  . Demerol [Meperidine] Other (See Comments)    Unknown   . Duloxetine Hcl   . Flagyl [Metronidazole]   . Hydrocodone-Acetaminophen Hives  . Morphine And Related Other (See Comments)    unknown  . Amoxicillin-Pot Clavulanate Nausea And Vomiting  . Latex Rash  . Sulfamethoxazole-Trimethoprim Rash  . Valacyclovir Rash    Review of Systems  Constitutional: Negative for fever, chills and weight loss.  HENT: Negative for congestion, hearing loss, sore throat and tinnitus.   Eyes: Negative for blurred vision, double vision and redness.  Respiratory: Negative for cough, hemoptysis and shortness of breath.   Cardiovascular: Negative for chest pain, palpitations, orthopnea, claudication and leg swelling.  Gastrointestinal: Negative for heartburn, nausea, vomiting,  diarrhea, constipation and blood in stool.  Genitourinary: Negative for dysuria, urgency, frequency and hematuria.  Musculoskeletal: Negative for myalgias, back pain, joint pain, falls and neck pain.  Skin: Negative for itching.  Neurological: Negative for dizziness, tingling, tremors, focal weakness, seizures, loss of consciousness, weakness and headaches.  Endo/Heme/Allergies: Does not bruise/bleed easily.  Psychiatric/Behavioral: Negative for depression and substance abuse. The patient is not nervous/anxious and does not have insomnia.      Objective  Filed Vitals:   08/27/15 1110  BP: 124/82  Pulse: 88  Temp: 98.4 F (36.9 C)  TempSrc: Oral  Resp: 14  Height: 5\' 6"  (1.676 m)  Weight: 161 lb 12.8 oz (73.392 kg)  SpO2: 96%     Physical Exam  Constitutional: She is oriented to person, place, and time and well-developed, well-nourished, and in no distress.  HENT:  Head: Normocephalic.  Eyes: EOM are normal. Pupils are equal, round, and reactive to light.  Neck: Normal range of motion. No thyromegaly present.  Cardiovascular: Normal rate, regular rhythm and normal heart sounds.   No murmur heard. Pulmonary/Chest: Effort normal and breath sounds normal.  Diminished breath sounds throughout with few expiratory wheezes and rhonchi.  Abdominal: Soft. Bowel sounds are normal.  Mild flank and suprapubic discomfort.  Musculoskeletal: Normal range of motion. She exhibits tenderness. She exhibits no edema.  Neurological: She is alert and oriented to person, place, and time. No cranial nerve deficit. Gait normal.  Skin: Skin is warm and dry. No rash noted.  Psychiatric: Memory and affect normal.      Assessment & Plan   1. Acute cystitis with hematuria Differential diagnosis includes UTI first is urethritis versus urethral stricture versus interstitial cystitis or other process involving the bladder wall or further up the urological system Differential diagnosis includes UTI  but she has sterile pyuria last visit. Differential must also include urethral stricture and the possibility of persistent interstitial cystitis she does need an evaluation with a urologist. - POCT urinalysis dipstick - amoxicillin-clavulanate (AUGMENTIN) 875-125 MG per tablet; Take 1 tablet by mouth 2 (two) times daily.  Dispense: 20 tablet; Refill: 0 - phenazopyridine (PYRIDIUM) 100 MG tablet; Take 1 tablet (100 mg total) by mouth 3 (three) times daily as needed for pain.  Dispense: 21 tablet; Refill: 0 - tamsulosin (FLOMAX) 0.4 MG CAPS capsule; Take 1 capsule (0.4 mg total) by mouth daily.  Dispense: 14 capsule; Refill: 0 - Urine Culture  2. Bronchitis Recurrent High-dose amoxicillin

## 2015-08-29 ENCOUNTER — Encounter: Payer: Self-pay | Admitting: Family Medicine

## 2015-08-29 ENCOUNTER — Encounter: Payer: Self-pay | Admitting: Intensive Care

## 2015-08-29 ENCOUNTER — Observation Stay
Admission: EM | Admit: 2015-08-29 | Discharge: 2015-08-31 | Disposition: A | Discharge status: Home / Self Care | Payer: Medicare Other | Attending: Internal Medicine | Admitting: Internal Medicine

## 2015-08-29 DIAGNOSIS — B9620 Unspecified Escherichia coli [E. coli] as the cause of diseases classified elsewhere: Secondary | ICD-10-CM | POA: Insufficient documentation

## 2015-08-29 DIAGNOSIS — N3001 Acute cystitis with hematuria: Secondary | ICD-10-CM | POA: Diagnosis not present

## 2015-08-29 DIAGNOSIS — N390 Urinary tract infection, site not specified: Secondary | ICD-10-CM | POA: Diagnosis present

## 2015-08-29 DIAGNOSIS — N10 Acute tubulo-interstitial nephritis: Secondary | ICD-10-CM | POA: Diagnosis not present

## 2015-08-29 DIAGNOSIS — Z880 Allergy status to penicillin: Secondary | ICD-10-CM | POA: Insufficient documentation

## 2015-08-29 DIAGNOSIS — J209 Acute bronchitis, unspecified: Secondary | ICD-10-CM | POA: Diagnosis not present

## 2015-08-29 DIAGNOSIS — N12 Tubulo-interstitial nephritis, not specified as acute or chronic: Secondary | ICD-10-CM

## 2015-08-29 DIAGNOSIS — Z853 Personal history of malignant neoplasm of breast: Secondary | ICD-10-CM | POA: Diagnosis not present

## 2015-08-29 DIAGNOSIS — Z9049 Acquired absence of other specified parts of digestive tract: Secondary | ICD-10-CM | POA: Diagnosis not present

## 2015-08-29 DIAGNOSIS — Z79899 Other long term (current) drug therapy: Secondary | ICD-10-CM | POA: Diagnosis not present

## 2015-08-29 DIAGNOSIS — Z7982 Long term (current) use of aspirin: Secondary | ICD-10-CM | POA: Insufficient documentation

## 2015-08-29 DIAGNOSIS — Z87891 Personal history of nicotine dependence: Secondary | ICD-10-CM | POA: Insufficient documentation

## 2015-08-29 DIAGNOSIS — M797 Fibromyalgia: Secondary | ICD-10-CM | POA: Insufficient documentation

## 2015-08-29 DIAGNOSIS — E876 Hypokalemia: Secondary | ICD-10-CM | POA: Insufficient documentation

## 2015-08-29 DIAGNOSIS — R339 Retention of urine, unspecified: Secondary | ICD-10-CM | POA: Insufficient documentation

## 2015-08-29 DIAGNOSIS — R300 Dysuria: Secondary | ICD-10-CM | POA: Insufficient documentation

## 2015-08-29 DIAGNOSIS — G629 Polyneuropathy, unspecified: Secondary | ICD-10-CM | POA: Insufficient documentation

## 2015-08-29 DIAGNOSIS — Z8673 Personal history of transient ischemic attack (TIA), and cerebral infarction without residual deficits: Secondary | ICD-10-CM | POA: Insufficient documentation

## 2015-08-29 DIAGNOSIS — I10 Essential (primary) hypertension: Secondary | ICD-10-CM | POA: Insufficient documentation

## 2015-08-29 LAB — BASIC METABOLIC PANEL
ANION GAP: 6 (ref 5–15)
BUN: 15 mg/dL (ref 6–20)
CALCIUM: 8.8 mg/dL — AB (ref 8.9–10.3)
CO2: 27 mmol/L (ref 22–32)
Chloride: 110 mmol/L (ref 101–111)
Creatinine, Ser: 0.99 mg/dL (ref 0.44–1.00)
GFR calc Af Amer: >60 mL/min (ref >60)
GFR calc non Af Amer: 57 mL/min — ABNORMAL LOW (ref >60)
GLUCOSE: 108 mg/dL — AB (ref 65–99)
POTASSIUM: 3 mmol/L — AB (ref 3.5–5.1)
SODIUM: 143 mmol/L (ref 135–145)

## 2015-08-29 LAB — CBC
HCT: 36.2 % (ref 35.0–47.0)
Hemoglobin: 12.1 g/dL (ref 12.0–16.0)
MCH: 29.7 pg (ref 26.0–34.0)
MCHC: 33.4 g/dL (ref 32.0–36.0)
MCV: 88.9 fL (ref 80.0–100.0)
Platelets: 182 10*3/uL (ref 150–440)
RBC: 4.07 MIL/uL (ref 3.80–5.20)
RDW: 14.3 % (ref 11.5–14.5)
WBC: 10.7 10*3/uL (ref 3.6–11.0)

## 2015-08-29 LAB — URINALYSIS COMPLETE WITH MICROSCOPIC (ARMC ONLY)
SQUAMOUS EPITHELIAL / LPF: NONE SEEN
Specific Gravity, Urine: 1.014 (ref 1.005–1.030)

## 2015-08-29 LAB — PLEASE NOTE

## 2015-08-29 LAB — URINE CULTURE

## 2015-08-29 MED ORDER — HYDROCHLOROTHIAZIDE 12.5 MG PO CAPS
12.5000 mg | ORAL_CAPSULE | Freq: Two times a day (BID) | ORAL | Status: DC
  Administered 2015-08-30 – 2015-08-31 (×3): 12.5 mg via ORAL
  Filled 2015-08-29 (×4): qty 1

## 2015-08-29 MED ORDER — ONDANSETRON HCL 4 MG/2ML IJ SOLN
4.0000 mg | Freq: Once | INTRAMUSCULAR | Status: AC
  Administered 2015-08-29: 4 mg via INTRAVENOUS
  Filled 2015-08-29: qty 2

## 2015-08-29 MED ORDER — DEXTROSE 5 % IV SOLN
1.0000 g | INTRAVENOUS | Status: DC
  Administered 2015-08-30: 16:00:00 1 g via INTRAVENOUS
  Filled 2015-08-29 (×2): qty 10

## 2015-08-29 MED ORDER — CYCLOBENZAPRINE HCL 10 MG PO TABS: 10.0000 mg | ORAL_TABLET | Freq: Three times a day (TID) | ORAL | Status: DC | PRN

## 2015-08-29 MED ORDER — POTASSIUM CHLORIDE CRYS ER 20 MEQ PO TBCR
20.0000 meq | EXTENDED_RELEASE_TABLET | Freq: Two times a day (BID) | ORAL | Status: DC
  Administered 2015-08-29 – 2015-08-31 (×4): 20 meq via ORAL
  Filled 2015-08-29 (×4): qty 1

## 2015-08-29 MED ORDER — DEXTROSE 5 % IV SOLN
INTRAVENOUS | Status: AC
  Administered 2015-08-29: 1 g via INTRAVENOUS
  Filled 2015-08-29: qty 10

## 2015-08-29 MED ORDER — ZOLPIDEM TARTRATE 5 MG PO TABS
5.0000 mg | ORAL_TABLET | Freq: Every day | ORAL | Status: DC
  Administered 2015-08-29 – 2015-08-30 (×2): 5 mg via ORAL
  Filled 2015-08-29 (×2): qty 1

## 2015-08-29 MED ORDER — GABAPENTIN 300 MG PO CAPS
300.0000 mg | ORAL_CAPSULE | Freq: Four times a day (QID) | ORAL | Status: DC
  Administered 2015-08-29 – 2015-08-31 (×7): 300 mg via ORAL
  Filled 2015-08-29 (×7): qty 1

## 2015-08-29 MED ORDER — ACETAMINOPHEN 325 MG PO TABS
650.0000 mg | ORAL_TABLET | Freq: Four times a day (QID) | ORAL | Status: DC | PRN
  Administered 2015-08-29 – 2015-08-30 (×2): 650 mg via ORAL
  Filled 2015-08-29 (×2): qty 2

## 2015-08-29 MED ORDER — PNEUMOCOCCAL VAC POLYVALENT 25 MCG/0.5ML IJ INJ
0.5000 mL | INJECTION | INTRAMUSCULAR | Status: DC
Start: 2015-08-30 — End: 2015-08-31

## 2015-08-29 MED ORDER — ENOXAPARIN SODIUM 40 MG/0.4ML ~~LOC~~ SOLN
40.0000 mg | SUBCUTANEOUS | Status: DC
  Administered 2015-08-29 – 2015-08-30 (×2): 40 mg via SUBCUTANEOUS
  Filled 2015-08-29 (×2): qty 0.4

## 2015-08-29 MED ORDER — TAMSULOSIN HCL 0.4 MG PO CAPS
0.4000 mg | ORAL_CAPSULE | Freq: Every day | ORAL | Status: DC
  Administered 2015-08-30 – 2015-08-31 (×2): 0.4 mg via ORAL
  Filled 2015-08-29 (×2): qty 1

## 2015-08-29 MED ORDER — ONDANSETRON HCL 4 MG/2ML IJ SOLN
INTRAMUSCULAR | Status: AC
  Administered 2015-08-29: 4 mg via INTRAVENOUS
  Filled 2015-08-29: qty 2

## 2015-08-29 MED ORDER — PHENAZOPYRIDINE HCL 100 MG PO TABS
100.0000 mg | ORAL_TABLET | Freq: Three times a day (TID) | ORAL | Status: DC
  Administered 2015-08-29 – 2015-08-31 (×5): 100 mg via ORAL
  Filled 2015-08-29 (×4): qty 1

## 2015-08-29 MED ORDER — ASPIRIN EC 81 MG PO TBEC
81.0000 mg | DELAYED_RELEASE_TABLET | Freq: Every day | ORAL | Status: DC
  Administered 2015-08-30 – 2015-08-31 (×2): 81 mg via ORAL
  Filled 2015-08-29 (×2): qty 1

## 2015-08-29 MED ORDER — BUTORPHANOL TARTRATE 10 MG/ML NA SOLN: 1.0000 | NASAL | Status: DC | PRN

## 2015-08-29 MED ORDER — ALPRAZOLAM 0.5 MG PO TABS: 0.5000 mg | ORAL_TABLET | Freq: Three times a day (TID) | ORAL | Status: DC | PRN

## 2015-08-29 MED ORDER — ONDANSETRON HCL 4 MG/2ML IJ SOLN
4.0000 mg | Freq: Four times a day (QID) | INTRAMUSCULAR | Status: DC | PRN
  Administered 2015-08-30 – 2015-08-31 (×2): 4 mg via INTRAVENOUS
  Filled 2015-08-29 (×2): qty 2

## 2015-08-29 MED ORDER — PHENAZOPYRIDINE HCL 100 MG PO TABS
100.0000 mg | ORAL_TABLET | Freq: Three times a day (TID) | ORAL | Status: DC
  Filled 2015-08-29: qty 1

## 2015-08-29 MED ORDER — DEXTROSE 5 % IV SOLN
1.0000 g | Freq: Once | INTRAVENOUS | Status: AC
  Administered 2015-08-29: 1 g via INTRAVENOUS
  Filled 2015-08-29: qty 10

## 2015-08-29 NOTE — ED Provider Notes (Signed)
[facility] Emergency Department Provider Note  ____________________________________________  Time seen: Approximately 3:43 PM  I have reviewed the triage vital signs and the nursing notes.   HISTORY  Chief Complaint Urinary Retention    HPI Donna Evans is a 69 y.o. female with a recurrent history of urinary tract infections, she had a urinary tract infection last month and was treated with amoxicillin and then had one 2 days ago and was again treated with amoxicillin. Patient cannot have Augmentin because it makes her vomit. She has multiple other intolerances of antibiotics is usually results in her getting amoxicillin for her UTIs. She states 2 days ago she started the amoxicillin that she started having flank pain, hesitancy, dysuria and increasing lower pelvic discomfort and she believed that the urinary tract was getting worse, this morning, she had a fever, last night she had Reiger's. Patient then presents with the symptoms at this time she feels "not too bad". She has vomited.  Past Medical History  Diagnosis Date  . Breast cancer   . Fibromyalgia   . Peripheral neuropathy   . Low back pain   . TIA (transient ischemic attack) 11/28/2013  . Transient global amnesia 2011  . Migraine headache   . Foot fracture     Bilateral  . Leg fracture, right     Patient Active Problem List   Diagnosis Date Noted  . Urinary tract infection 07/28/2015  . Hematuria 07/28/2015  . Nausea and vomiting 07/28/2015  . Acute confusional state 04/29/2015  . TIA (transient ischemic attack) 11/28/2013  . Hemiplegia, unspecified, affecting dominant side 11/20/2013    Past Surgical History  Procedure Laterality Date  . Bilateral total mastectomy with axillary lymph node dissection  1990  . Appendectomy  1957  . Cholecystectomy  1979  . Back surgery  1989  . Abdominal hysterectomy  1981  . Liver biopsy  2011    Current Outpatient Rx  Name  Route  Sig   Dispense  Refill  . ALPRAZolam (XANAX) 1 MG tablet      TAKE ONE TABLET 3 TIMES DAILY AS NEEDED   90 tablet   1     FOR NEXT FILL. THANK YOU   . amoxicillin (AMOXIL) 875 MG tablet   Oral   Take 1 tablet (875 mg total) by mouth 2 (two) times daily.   20 tablet   0     DC formal order for Augmentin   . aspirin EC 81 MG tablet   Oral   Take 81 mg by mouth daily.         . butorphanol (STADOL) 10 MG/ML nasal spray   Nasal   Place 1 spray into the nose every 4 (four) hours as needed for headache.          . butorphanol (STADOL) 10 MG/ML nasal spray   Nasal   Place into the nose.         . butorphanol (STADOL) 10 MG/ML nasal spray      TAKE 1 SPRAY IN EACH NOSTRIL EVERY 3 HOURS AS NEEDED FOR SEVERE PAIN   15 mL   0   . butorphanol (STADOL) 10 MG/ML nasal spray      TAKE 1 SPRAY IN EACH NOSTRIL EVERY 3 HOURS AS NEEDED FOR SEVERE PAIN   15 mL   2     PROFILE FOR NEXT MONTH   . colestipol (COLESTID) 1 G tablet      TAKE ONE TABLET 3  TIMES DAILY BEFORE MEALS         . cyclobenzaprine (FLEXERIL) 10 MG tablet   Oral   Take 10 mg by mouth 3 (three) times daily as needed for muscle spasms.         Marland Kitchen gabapentin (NEURONTIN) 300 MG capsule   Oral   Take 1 capsule by mouth 4 (four) times daily.          . hydrochlorothiazide (MICROZIDE) 12.5 MG capsule   Oral   Take 12.5 mg by mouth 2 (two) times daily.         Marland Kitchen lidocaine (LIDODERM) 5 %   Transdermal   Place 1 patch onto the skin daily.         . phenazopyridine (PYRIDIUM) 100 MG tablet   Oral   Take 1 tablet (100 mg total) by mouth 3 (three) times daily as needed for pain.   21 tablet   0   . tamsulosin (FLOMAX) 0.4 MG CAPS capsule   Oral   Take 1 capsule (0.4 mg total) by mouth daily.   14 capsule   0   . torsemide (DEMADEX) 20 MG tablet   Oral   Take 20 mg by mouth daily as needed.          . zolpidem (AMBIEN) 10 MG tablet      TAKE ONE TABLET AT BEDTIME   30 tablet   2      PROFILE FOR NEXT MONTH     Allergies Ciprofloxacin; Fluconazole; Codeine; Demerol; Duloxetine hcl; Flagyl; Hydrocodone-acetaminophen; Morphine and related; Amoxicillin-pot clavulanate; Latex; Sulfamethoxazole-trimethoprim; and Valacyclovir  Family History  Problem Relation Age of Onset  . Atrial fibrillation Mother   . Atrial fibrillation Sister   . Cancer Sister   . Diabetes Sister   . Diabetes Father   . Cancer Sister   . Diabetes Sister   . Atrial fibrillation Sister     Social History Social History  Substance Use Topics  . Smoking status: Former Smoker -- 1.00 packs/day for 40 years    Types: Cigarettes    Quit date: 08/28/2005  . Smokeless tobacco: Never Used     Comment: 9 years  . Alcohol Use: 0.0 oz/week    0 Standard drinks or equivalent per week     Comment: rare    Review of Systems Constitutional: fever/chills Eyes: No visual changes. ENT: No sore throat. Cardiovascular: Denies chest pain. Respiratory: Denies shortness of breath. Gastrointestinal: See history of present illness abdominal pain. vomiting.  No diarrhea.  No constipation. Genitourinary: Negative for dysuria. Musculoskeletal: Negative for back pain. Positive for CVA pain Skin: Negative for rash. Neurological: Negative for headaches, focal weakness or numbness. 10-point ROS otherwise negative.  ____________________________________________   PHYSICAL EXAM:  VITAL SIGNS: ED Triage Vitals  Enc Vitals Group     BP 08/29/15 1332 121/51 mmHg     Pulse Rate 08/29/15 1332 93     Resp 08/29/15 1332 12     Temp 08/29/15 1332 100.4 F (38 C)     Temp Source 08/29/15 1332 Oral     SpO2 08/29/15 1332 94 %     Weight 08/29/15 1352 159 lb (72.122 kg)     Height 08/29/15 1352 5' 6.5" (1.689 m)     Head Cir --      Peak Flow --      Pain Score 08/29/15 1336 8     Pain Loc --      Pain Edu? --  Excl. in [place]? --     Constitutional: Alert and oriented. Well appearing and in no acute  distress. Eyes: Conjunctivae are normal. PERRL. EOMI. Head: Atraumatic. Nose: No congestion/rhinnorhea. Mouth/Throat: Mucous membranes are moist.  Oropharynx non-erythematous. Neck: No stridor.   Cardiovascular: Normal rate, regular rhythm. Grossly normal heart sounds.  Good peripheral circulation. Respiratory: Normal respiratory effort.  No retractions. Lungs CTAB. Gastrointestinal: Soft and nontender. No distention. No abdominal bruits. Right greater than left CVA tenderness. Musculoskeletal: No lower extremity tenderness nor edema.  No joint effusions. Neurologic:  Normal speech and language. No gross focal neurologic deficits are appreciated. No gait instability. Skin:  Skin is warm, dry and intact. No rash noted. Psychiatric: Mood and affect are normal. Speech and behavior are normal.  ____________________________________________   LABS (all labs ordered are listed, but only abnormal results are displayed)  Labs Reviewed  BASIC METABOLIC PANEL - Abnormal; Notable for the following:    Potassium 3.0 (*)    Glucose, Bld 108 (*)    Calcium 8.8 (*)    GFR calc non Af Amer 57 (*)    All other components within normal limits  URINALYSIS COMPLETEWITH MICROSCOPIC ([facility] ONLY) - Abnormal; Notable for the following:    Color, Urine ORANGE (*)    APPearance CLOUDY (*)    Glucose, UA   (*)    Value: TEST NOT REPORTED DUE TO COLOR INTERFERENCE OF URINE PIGMENT   Bilirubin Urine   (*)    Value: TEST NOT REPORTED DUE TO COLOR INTERFERENCE OF URINE PIGMENT   Ketones, ur   (*)    Value: TEST NOT REPORTED DUE TO COLOR INTERFERENCE OF URINE PIGMENT   Hgb urine dipstick   (*)    Value: TEST NOT REPORTED DUE TO COLOR INTERFERENCE OF URINE PIGMENT   Protein, ur   (*)    Value: TEST NOT REPORTED DUE TO COLOR INTERFERENCE OF URINE PIGMENT   Nitrite   (*)    Value: TEST NOT REPORTED DUE TO COLOR INTERFERENCE OF URINE PIGMENT   Leukocytes, UA   (*)    Value: TEST NOT REPORTED DUE TO COLOR  INTERFERENCE OF URINE PIGMENT   Bacteria, UA RARE (*)    All other components within normal limits  URINE CULTURE  CBC   _______________________________________________________________________  ____________________________________________   PROCEDURES  Procedure(s) performed: None  Critical Care performed: None  ____________________________________________   INITIAL IMPRESSION / ASSESSMENT AND PLAN / ED COURSE  Pertinent labs & imaging results that were available during my care of the patient were reviewed by me and considered in my medical decision making (see chart for details).  Patient with likely pyelonephritis, urine cultures do show sensitivity to Rocephin. Patient is not allergic to Rocephin. She does vomit sometimes with penicillins. We will give her antibiotics, Rocephin, IV fluids, and will get her to the hospital for failure to progress with outpatient antibiosis ____________________________________________   FINAL CLINICAL IMPRESSION(S) / ED DIAGNOSES  Final diagnoses:  None     Schuyler Amor, MD 08/29/15 1549

## 2015-08-29 NOTE — Patient Instructions (Signed)
Needs referral to urologist for Giardia has scheduled she probably cystoscopy as well

## 2015-08-29 NOTE — H&P (Signed)
[facility] [place] NAME: Donna Evans    MR#:  [id number]  DATE OF BIRTH:  03/30/1946  DATE OF ADMISSION:  08/29/2015  PRIMARY CARE PHYSICIAN: Ashok Norris, MD   REQUESTING/REFERRING PHYSICIAN: Dr. Burlene Arnt  CHIEF COMPLAINT:   Chief Complaint  Patient presents with  . Urinary Retention    HISTORY OF PRESENT ILLNESS: Donna Evans  is a 69 y.o. female with a known history of TIA, neuropathy, breast cancer, fibromyalgia- had urinary symptoms and constant nagging pain in lower abdomen for last 1 week, also had some cough. She went to her PMD 2 days ago who did the urinalysis and urine culture- and give her amoxicillin as she has multiple allergies. She continued to have riders and chills feeling nauseous and she vomited one time. She also had low-grade fever. So she came to emergency room today and was noted to have urinary infection. Given to hospitalist team as she failed outpatient treatment. She also said that for last few days she had to strain while urinating and still minimal amount of urine comes out.  PAST MEDICAL HISTORY:   Past Medical History  Diagnosis Date  . Breast cancer   . Fibromyalgia   . Peripheral neuropathy   . Low back pain   . TIA (transient ischemic attack) 11/28/2013  . Transient global amnesia 2011  . Migraine headache   . Foot fracture     Bilateral  . Leg fracture, right     PAST SURGICAL HISTORY:  Past Surgical History  Procedure Laterality Date  . Bilateral total mastectomy with axillary lymph node dissection  1990  . Appendectomy  1957  . Cholecystectomy  1979  . Back surgery  1989  . Abdominal hysterectomy  1981  . Liver biopsy  2011    SOCIAL HISTORY:  Social History  Substance Use Topics  . Smoking status: Former Smoker -- 1.00 packs/day for 40 years    Types: Cigarettes    Quit date: 08/28/2005  . Smokeless tobacco: Never Used     Comment: 9 years  . Alcohol Use: 0.0 oz/week     0 Standard drinks or equivalent per week     Comment: rare    FAMILY HISTORY:  Family History  Problem Relation Age of Onset  . Atrial fibrillation Mother   . Atrial fibrillation Sister   . Cancer Sister   . Diabetes Sister   . Diabetes Father   . Cancer Sister   . Diabetes Sister   . Atrial fibrillation Sister     DRUG ALLERGIES:  Allergies  Allergen Reactions  . Ciprofloxacin Hives  . Fluconazole Hives  . Codeine Other (See Comments)    unknown  . Demerol [Meperidine] Other (See Comments)    Unknown   . Duloxetine Hcl   . Flagyl [Metronidazole]   . Hydrocodone-Acetaminophen Hives  . Morphine And Related Other (See Comments)    unknown  . Amoxicillin-Pot Clavulanate Nausea And Vomiting  . Latex Rash  . Sulfamethoxazole-Trimethoprim Rash  . Valacyclovir Rash    REVIEW OF SYSTEMS:   CONSTITUTIONAL: low grade fever, weakness. Rigors present. EYES: No blurred or double vision.  EARS, NOSE, AND THROAT: No tinnitus or ear pain.  RESPIRATORY: No cough, shortness of breath, wheezing or hemoptysis.  CARDIOVASCULAR: No chest pain, orthopnea, edema.  GASTROINTESTINAL: mild nausea and no vomiting, diarrhea, slight lower abdominal pain. GENITOURINARY: No dysuria, hematuria. Has decreased urination. ENDOCRINE: No polyuria, nocturia,  HEMATOLOGY: No anemia, easy  bruising or bleeding SKIN: No rash or lesion. MUSCULOSKELETAL: No joint pain or arthritis.   NEUROLOGIC: No tingling, numbness, weakness.  PSYCHIATRY: No anxiety or depression.   MEDICATIONS AT HOME:  Prior to Admission medications   Medication Sig Start Date End Date Taking? Authorizing Provider  ALPRAZolam Duanne Moron) 1 MG tablet TAKE ONE TABLET 3 TIMES DAILY AS NEEDED 05/29/15   Ashok Norris, MD  amoxicillin (AMOXIL) 875 MG tablet Take 1 tablet (875 mg total) by mouth 2 (two) times daily. 08/27/15   Ashok Norris, MD  aspirin EC 81 MG tablet Take 81 mg by mouth daily.    Historical Provider, MD  butorphanol  (STADOL) 10 MG/ML nasal spray Place 1 spray into the nose every 4 (four) hours as needed for headache.  11/19/13   Historical Provider, MD  butorphanol (STADOL) 10 MG/ML nasal spray Place into the nose.    Historical Provider, MD  butorphanol (STADOL) 10 MG/ML nasal spray TAKE 1 SPRAY IN EACH NOSTRIL EVERY 3 HOURS AS NEEDED FOR SEVERE PAIN 07/30/15   Ashok Norris, MD  butorphanol (STADOL) 10 MG/ML nasal spray TAKE 1 SPRAY IN EACH NOSTRIL EVERY 3 HOURS AS NEEDED FOR SEVERE PAIN 08/22/15   Ashok Norris, MD  colestipol (COLESTID) 1 G tablet TAKE ONE TABLET 3 TIMES DAILY BEFORE MEALS 12/03/14   Historical Provider, MD  cyclobenzaprine (FLEXERIL) 10 MG tablet Take 10 mg by mouth 3 (three) times daily as needed for muscle spasms.    Historical Provider, MD  gabapentin (NEURONTIN) 300 MG capsule Take 1 capsule by mouth 4 (four) times daily.  10/10/13   Historical Provider, MD  hydrochlorothiazide (MICROZIDE) 12.5 MG capsule Take 12.5 mg by mouth 2 (two) times daily.    Historical Provider, MD  lidocaine (LIDODERM) 5 % Place 1 patch onto the skin daily. 10/10/13   Historical Provider, MD  phenazopyridine (PYRIDIUM) 100 MG tablet Take 1 tablet (100 mg total) by mouth 3 (three) times daily as needed for pain. 08/27/15   Ashok Norris, MD  tamsulosin (FLOMAX) 0.4 MG CAPS capsule Take 1 capsule (0.4 mg total) by mouth daily. 08/27/15   Ashok Norris, MD  torsemide (DEMADEX) 20 MG tablet Take 20 mg by mouth daily as needed.  10/10/13   Historical Provider, MD  zolpidem (AMBIEN) 10 MG tablet TAKE ONE TABLET AT BEDTIME 08/22/15   Ashok Norris, MD      PHYSICAL EXAMINATION:   VITAL SIGNS: Blood pressure 121/51, pulse 93, temperature 100.4 F (38 C), temperature source Oral, resp. rate 12, height 5' 6.5" (1.689 m), weight 72.122 kg (159 lb), SpO2 94 %.  GENERAL:  69 y.o.-year-old patient lying in the bed with no acute distress.  EYES: Pupils equal, round, reactive to light and accommodation. No scleral  icterus. Extraocular muscles intact.  HEENT: Head atraumatic, normocephalic. Oropharynx and nasopharynx clear.  NECK:  Supple, no jugular venous distention. No thyroid enlargement, no tenderness.  LUNGS: Normal breath sounds bilaterally, no wheezing, rales,rhonchi or crepitation. No use of accessory muscles of respiration.  CARDIOVASCULAR: S1, S2 normal. No murmurs, rubs, or gallops.  ABDOMEN: Soft,mildtender, nondistended. Bowel sounds present. No organomegaly or mass.  EXTREMITIES: +  pedal edema, cyanosis, or clubbing.  NEUROLOGIC: Cranial nerves II through XII are intact. Muscle strength 5/5 in all extremities. Sensation intact. Gait not checked.  PSYCHIATRIC: The patient is alert and oriented x 3.  SKIN: No obvious rash, lesion, or ulcer.   LABORATORY PANEL:   CBC  Recent Labs Lab 08/29/15 1341  WBC 10.7  HGB 12.1  HCT 36.2  PLT 182  MCV 88.9  MCH 29.7  MCHC 33.4  RDW 14.3   ------------------------------------------------------------------------------------------------------------------  Chemistries   Recent Labs Lab 08/29/15 1341  NA 143  K 3.0*  CL 110  CO2 27  GLUCOSE 108*  BUN 15  CREATININE 0.99  CALCIUM 8.8*   ------------------------------------------------------------------------------------------------------------------ estimated creatinine clearance is 51.2 mL/min (by C-G formula based on Cr of 0.99). ------------------------------------------------------------------------------------------------------------------ No results for input(s): TSH, T4TOTAL, T3FREE, THYROIDAB in the last 72 hours.  Invalid input(s): FREET3   Coagulation profile No results for input(s): INR, PROTIME in the last 168 hours. ------------------------------------------------------------------------------------------------------------------- No results for input(s): DDIMER in the last 72  hours. -------------------------------------------------------------------------------------------------------------------  Cardiac Enzymes No results for input(s): CKMB, TROPONINI, MYOGLOBIN in the last 168 hours.  Invalid input(s): CK ------------------------------------------------------------------------------------------------------------------ Invalid input(s): POCBNP  ---------------------------------------------------------------------------------------------------------------  Urinalysis    Component Value Date/Time   COLORURINE ORANGE* 08/29/2015 1400   APPEARANCEUR CLOUDY* 08/29/2015 1400   LABSPEC 1.014 08/29/2015 1400   PHURINE  08/29/2015 1400    TEST NOT REPORTED DUE TO COLOR INTERFERENCE OF URINE PIGMENT   GLUCOSEU * 08/29/2015 1400    TEST NOT REPORTED DUE TO COLOR INTERFERENCE OF URINE PIGMENT   HGBUR * 08/29/2015 1400    TEST NOT REPORTED DUE TO COLOR INTERFERENCE OF URINE PIGMENT   BILIRUBINUR * 08/29/2015 1400    TEST NOT REPORTED DUE TO COLOR INTERFERENCE OF URINE PIGMENT   BILIRUBINUR negative 08/27/2015 1115   KETONESUR * 08/29/2015 1400    TEST NOT REPORTED DUE TO COLOR INTERFERENCE OF URINE PIGMENT   PROTEINUR * 08/29/2015 1400    TEST NOT REPORTED DUE TO COLOR INTERFERENCE OF URINE PIGMENT   PROTEINUR large 08/27/2015 1115   UROBILINOGEN 2.0 08/27/2015 1115   UROBILINOGEN 0.2 11/20/2013 0201   NITRITE * 08/29/2015 1400    TEST NOT REPORTED DUE TO COLOR INTERFERENCE OF URINE PIGMENT   NITRITE positive 08/27/2015 1115   LEUKOCYTESUR * 08/29/2015 1400    TEST NOT REPORTED DUE TO COLOR INTERFERENCE OF URINE PIGMENT     RADIOLOGY: No results found.  IMPRESSION AND PLAN:  * UTI  IV Rocephin started by ER as per the culture report from 2 days ago.  * Acute bronchitis  She was complaining of cough  IV Rocephin and we will get a chest x-ray.  * Hypertension Continue hydrochlorothiazide.  * Urinary retention Continue  Flomax.  *Neuropathy Continue gabapentin.  All the records are reviewed and case discussed with ED provider. Management plans discussed with the patient, family and they are in agreement.  CODE STATUS: full Advance Directive Documentation        Most Recent Value   Type of Advance Directive  Healthcare Power of Attorney, Living will   Pre-existing out of facility DNR order (yellow form or pink MOST form)     "MOST" Form in Place?         TOTAL TIME TAKING CARE OF THIS PATIENT: 44   Min.    Vaughan Basta M.D on 08/29/2015   Between 7am to 6pm - Pager - [phone]  After 6pm go to [email or web address] - password [email or web address] [facility]  Office  [phone]  CC: Primary care physician; Ashok Norris, MD

## 2015-08-29 NOTE — ED Notes (Signed)
Patient reports having a previous UTI and still currently taking antibiotics. Patient reports she still is not feeling well and is having pain in her back and shaking.

## 2015-08-30 LAB — CBC
HEMATOCRIT: 34.6 % — AB (ref 35.0–47.0)
HEMOGLOBIN: 11.8 g/dL — AB (ref 12.0–16.0)
MCH: 30.6 pg (ref 26.0–34.0)
MCHC: 34.2 g/dL (ref 32.0–36.0)
MCV: 89.6 fL (ref 80.0–100.0)
Platelets: 159 10*3/uL (ref 150–440)
RBC: 3.87 MIL/uL (ref 3.80–5.20)
RDW: 14 % (ref 11.5–14.5)
WBC: 9.1 10*3/uL (ref 3.6–11.0)

## 2015-08-30 LAB — BASIC METABOLIC PANEL
ANION GAP: 5 (ref 5–15)
BUN: 14 mg/dL (ref 6–20)
CHLORIDE: 111 mmol/L (ref 101–111)
CO2: 28 mmol/L (ref 22–32)
Calcium: 8.3 mg/dL — ABNORMAL LOW (ref 8.9–10.3)
Creatinine, Ser: 0.96 mg/dL (ref 0.44–1.00)
GFR calc Af Amer: >60 mL/min (ref >60)
GFR, EST NON AFRICAN AMERICAN: 59 mL/min — AB (ref >60)
GLUCOSE: 102 mg/dL — AB (ref 65–99)
POTASSIUM: 3.3 mmol/L — AB (ref 3.5–5.1)
Sodium: 144 mmol/L (ref 135–145)

## 2015-08-30 MED ORDER — NITROFURANTOIN MONOHYD MACRO 100 MG PO CAPS: 100.0000 mg | ORAL_CAPSULE | Freq: Two times a day (BID) | ORAL | Status: DC

## 2015-08-30 MED ORDER — CEFIXIME 400 MG PO CAPS
400.0000 mg | ORAL_CAPSULE | Freq: Every day | ORAL | Status: DC
  Administered 2015-08-30 – 2015-08-31 (×2): 400 mg via ORAL
  Filled 2015-08-30 (×2): qty 1

## 2015-08-30 NOTE — Progress Notes (Addendum)
Notified Dr. Jannifer Franklin of patient Low BP, HCTZ held per MD.

## 2015-08-30 NOTE — Plan of Care (Signed)
Problem: Discharge Progression Outcomes Goal: Other Discharge Outcomes/Goals Plan of care progress to goal for: - No complaints of pain. - Continues on ABX. - Ambulates with assist. - VSS. Will continue to monitor.

## 2015-08-30 NOTE — Progress Notes (Signed)
[facility] [place] NAME: Donna Evans    MR#:  [id number]  DATE OF BIRTH:  1946/04/10  SUBJECTIVE:  C/o left sided back pain  REVIEW OF SYSTEMS:    Review of Systems  Constitutional: Negative for fever, chills and malaise/fatigue.  HENT: Negative for sore throat.   Eyes: Negative for blurred vision.  Respiratory: Negative for cough, hemoptysis, shortness of breath and wheezing.   Cardiovascular: Negative for chest pain, palpitations and leg swelling.  Gastrointestinal: Negative for nausea, vomiting, abdominal pain, diarrhea and blood in stool.  Genitourinary: Negative for dysuria.  Musculoskeletal: Positive for back pain.  Neurological: Negative for dizziness, tremors and headaches.  Endo/Heme/Allergies: Does not bruise/bleed easily.    Tolerating Diet:yes      DRUG ALLERGIES:   Allergies  Allergen Reactions  . Ciprofloxacin Hives  . Fluconazole Hives  . Codeine Other (See Comments)    Reaction:  Unknown   . Demerol [Meperidine] Other (See Comments)    Reaction:  Unknown    . Duloxetine Hcl Other (See Comments)    Reaction:  Unknown   . Flagyl [Metronidazole] Other (See Comments)    Reaction:  Unknown   . Hydrocodone-Acetaminophen Hives  . Morphine And Related Other (See Comments)    Reaction:  Unknown   . Amoxicillin-Pot Clavulanate Nausea And Vomiting  . Latex Rash  . Sulfamethoxazole-Trimethoprim Rash  . Valacyclovir Rash    VITALS:  Blood pressure 115/52, pulse 81, temperature 99.5 F (37.5 C), temperature source Oral, resp. rate 18, height 5\' 6"  (1.676 m), weight 70.761 kg (156 lb), SpO2 93 %.  PHYSICAL EXAMINATION:   Physical Exam  Constitutional: She is oriented to person, place, and time and well-developed, well-nourished, and in no distress. No distress.  HENT:  Head: Normocephalic.  Eyes: No scleral icterus.  Neck: Normal range of motion. Neck supple. No JVD present. No tracheal deviation  present.  Cardiovascular: Normal rate, regular rhythm and normal heart sounds.  Exam reveals no gallop and no friction rub.   No murmur heard. Pulmonary/Chest: Effort normal and breath sounds normal. No respiratory distress. She has no wheezes. She has no rales. She exhibits no tenderness.  Abdominal: Soft. Bowel sounds are normal. She exhibits no distension and no mass. There is no tenderness. There is no rebound and no guarding.  CVAT left side  Musculoskeletal: Normal range of motion. She exhibits no edema.  Neurological: She is alert and oriented to person, place, and time.  Skin: Skin is warm. No rash noted. No erythema.  Psychiatric: Affect and judgment normal.      LABORATORY PANEL:   CBC  Recent Labs Lab 08/30/15 0409  WBC 9.1  HGB 11.8*  HCT 34.6*  PLT 159   ------------------------------------------------------------------------------------------------------------------  Chemistries   Recent Labs Lab 08/30/15 0409  NA 144  K 3.3*  CL 111  CO2 28  GLUCOSE 102*  BUN 14  CREATININE 0.96  CALCIUM 8.3*   ------------------------------------------------------------------------------------------------------------------  Cardiac Enzymes No results for input(s): TROPONINI in the last 168 hours. ------------------------------------------------------------------------------------------------------------------  RADIOLOGY:  No results found.   ASSESSMENT AND PLAN:   69 year old female who presented with dysuria.  1. Acute pyelo-nephritis: Urine culture is positive for Escherichia coli with multiple sensitivities including Rocephin. However patient has multiple allergies which makes prescribing oral antibiotics difficult. I will try Suprax while continuing Rocephin to see patient has any allergies with this medication.  2. Essential hypertension: Continue HCTZ  3. Hypo-kalemia: Potassium has been  repleted and recheck in a.m.  4. History of TIA: Continue  aspirin    Management plans discussed with the patient and she is in agreement.  CODE STATUS: FULL  TOTAL TIME TAKING CARE OF THIS PATIENT: 30 minutes.     POSSIBLE D/C 1-2 days, DEPENDING ON CLINICAL CONDITION.   MODY, SITAL M.D on 08/30/2015 at 10:35 AM  Between 7am to 6pm - Pager - [phone] After 6pm go to [email or web address] - password [email or web address] [facility]  Office  [phone]  CC: Primary care physician; Ashok Norris, MD

## 2015-08-30 NOTE — Plan of Care (Addendum)
Problem: Discharge Progression Outcomes Goal: Other Discharge Outcomes/Goals Outcome: Progressing Plan of care progress to goal: No c/o pain. IV ABX ordered. Afebrile Independent with needs. Husband visited today. Plan for home d/c when stable.

## 2015-08-31 LAB — BASIC METABOLIC PANEL
Anion gap: 4 — ABNORMAL LOW (ref 5–15)
BUN: 16 mg/dL (ref 6–20)
CO2: 31 mmol/L (ref 22–32)
Calcium: 8.5 mg/dL — ABNORMAL LOW (ref 8.9–10.3)
Chloride: 107 mmol/L (ref 101–111)
Creatinine, Ser: 1.05 mg/dL — ABNORMAL HIGH (ref 0.44–1.00)
GFR calc Af Amer: >60 mL/min (ref >60)
GFR calc non Af Amer: 53 mL/min — ABNORMAL LOW (ref >60)
Glucose, Bld: 101 mg/dL — ABNORMAL HIGH (ref 65–99)
Potassium: 3.4 mmol/L — ABNORMAL LOW (ref 3.5–5.1)
Sodium: 142 mmol/L (ref 135–145)

## 2015-08-31 MED ORDER — CEFIXIME 400 MG PO CAPS: 400.0000 mg | ORAL_CAPSULE | Freq: Every day | ORAL | Status: DC

## 2015-08-31 NOTE — Progress Notes (Signed)
Small brown formed

## 2015-08-31 NOTE — Progress Notes (Signed)
telephone

## 2015-08-31 NOTE — Progress Notes (Signed)
Pt for discharge home. Alert/ oriented. No resp distress. Sl d/cd.  Discharge inst. Discussed with pt. presc given and  That and home meds discussed. Diet/ activity and f/u discussed. Pt verbalize understanding.  Waiting for friend to come pick her up.

## 2015-08-31 NOTE — Discharge Summary (Signed)
[facility] [place] NAME: Donna Evans    MR#:  [id number]  DATE OF BIRTH:  16-Nov-1946  DATE OF ADMISSION:  08/29/2015 ADMITTING PHYSICIAN: Vaughan Basta, MD  DATE OF DISCHARGE:08/31/2015 PRIMARY CARE PHYSICIAN: Ashok Norris, MD    ADMISSION DIAGNOSIS:  UTI  DISCHARGE DIAGNOSIS:  Principal Problem:   Urinary tract infection Active Problems:   UTI (lower urinary tract infection)   SECONDARY DIAGNOSIS:   Past Medical History  Diagnosis Date  . Breast cancer   . Fibromyalgia   . Peripheral neuropathy   . Low back pain   . TIA (transient ischemic attack) 11/28/2013  . Transient global amnesia 2011  . Migraine headache   . Foot fracture     Bilateral  . Leg fracture, right     HOSPITAL COURSE:   69 year old female who presented with dysuria.  1. Acute pyelonephritis: Urine culture is positive for Escherichia coli with multiple sensitivities including Rocephin. However patient has multiple allergies which makes prescribing oral antibiotics difficult. She tolerated Suprax and will require 12 more days of treatment.  2. Essential hypertension: Continue HCTZ  3. Hypokalemia: Potassium repleted  4. History of TIA: Continue aspirin  DISCHARGE CONDITIONS AND DIET:  Patient is being discharged home in stable condition on a heart healthy diet  CONSULTS OBTAINED:     DRUG ALLERGIES:   Allergies  Allergen Reactions  . Ciprofloxacin Hives  . Fluconazole Hives  . Codeine Other (See Comments)    Reaction:  Unknown   . Demerol [Meperidine] Other (See Comments)    Reaction:  Unknown    . Duloxetine Hcl Other (See Comments)    Reaction:  Unknown   . Flagyl [Metronidazole] Other (See Comments)    Reaction:  Unknown   . Hydrocodone-Acetaminophen Hives  . Morphine And Related Other (See Comments)    Reaction:  Unknown   . Amoxicillin-Pot Clavulanate Nausea And Vomiting  . Latex Rash  .  Sulfamethoxazole-Trimethoprim Rash  . Valacyclovir Rash    DISCHARGE MEDICATIONS:   Current Discharge Medication List    START taking these medications   Details  Cefixime (SUPRAX) 400 MG CAPS capsule Take 1 capsule (400 mg total) by mouth daily. Qty: 12 capsule, Refills: 0      CONTINUE these medications which have NOT CHANGED   Details  ALPRAZolam (XANAX) 1 MG tablet Take 2 mg by mouth at bedtime.    amoxicillin (AMOXIL) 875 MG tablet Take 1 tablet (875 mg total) by mouth 2 (two) times daily. Qty: 20 tablet, Refills: 0   Associated Diagnoses: Bronchitis    aspirin EC 81 MG tablet Take 81 mg by mouth at bedtime.     butorphanol (STADOL) 10 MG/ML nasal spray Place 1 spray into the nose every 3 (three) hours as needed (for pain).     colestipol (COLESTID) 1 G tablet Take 1 g by mouth 3 (three) times daily before meals.    cyclobenzaprine (FLEXERIL) 10 MG tablet Take 10 mg by mouth 3 (three) times daily as needed for muscle spasms.    lidocaine (LIDODERM) 5 % Place 1 patch onto the skin daily as needed (for pain). Remove & Discard patch within 12 hours or as directed by MD    phenazopyridine (PYRIDIUM) 100 MG tablet Take 1 tablet (100 mg total) by mouth 3 (three) times daily as needed for pain. Qty: 21 tablet, Refills: 0   Associated Diagnoses: Acute cystitis with hematuria    tamsulosin (FLOMAX) 0.4  MG CAPS capsule Take 1 capsule (0.4 mg total) by mouth daily. Qty: 14 capsule, Refills: 0   Associated Diagnoses: Acute cystitis with hematuria    torsemide (DEMADEX) 20 MG tablet Take 20 mg by mouth every 14 (fourteen) days.    zolpidem (AMBIEN) 10 MG tablet Take 10 mg by mouth at bedtime as needed for sleep.      STOP taking these medications     gabapentin (NEURONTIN) 300 MG capsule      hydrochlorothiazide (MICROZIDE) 12.5 MG capsule               Today   CHIEF COMPLAINT:  Patient is doing well this morning. She was able to tolerate by mouth  medications.   VITAL SIGNS:  Blood pressure 115/68, pulse 64, temperature 98 F (36.7 C), temperature source Oral, resp. rate 19, height 5\' 6"  (1.676 m), weight 70.761 kg (156 lb), SpO2 95 %.   REVIEW OF SYSTEMS:  Review of Systems  Constitutional: Negative for fever, chills and malaise/fatigue.  HENT: Negative for sore throat.   Eyes: Negative for blurred vision.  Respiratory: Negative for cough, hemoptysis, shortness of breath and wheezing.   Cardiovascular: Negative for chest pain, palpitations and leg swelling.  Gastrointestinal: Negative for nausea, vomiting, abdominal pain, diarrhea and blood in stool.  Genitourinary: Negative for dysuria.  Musculoskeletal: Negative for back pain.  Neurological: Negative for dizziness, tremors and headaches.  Endo/Heme/Allergies: Does not bruise/bleed easily.     PHYSICAL EXAMINATION:  GENERAL:  69 y.o.-year-old patient lying in the bed with no acute distress.  NECK:  Supple, no jugular venous distention. No thyroid enlargement, no tenderness.  LUNGS: Normal breath sounds bilaterally, no wheezing, rales,rhonchi  No use of accessory muscles of respiration.  CARDIOVASCULAR: S1, S2 normal. No murmurs, rubs, or gallops.  ABDOMEN: Soft, non-tender, non-distended. Bowel sounds present. No organomegaly or mass.  EXTREMITIES: No pedal edema, cyanosis, or clubbing.  PSYCHIATRIC: The patient is alert and oriented x 3.  SKIN: No obvious rash, lesion, or ulcer.   DATA REVIEW:   CBC  Recent Labs Lab 08/30/15 0409  WBC 9.1  HGB 11.8*  HCT 34.6*  PLT 159    Chemistries   Recent Labs Lab 08/31/15 0430  NA 142  K 3.4*  CL 107  CO2 31  GLUCOSE 101*  BUN 16  CREATININE 1.05*  CALCIUM 8.5*    Cardiac Enzymes No results for input(s): TROPONINI in the last 168 hours.  Microbiology Results  @[email or web address] @  RADIOLOGY:  No results found.    Management plans discussed with the patient and she is in agreement. Stable for discharge  home  Patient should follow up with PCP in 1 week  CODE STATUS:     Code Status Orders        Start     Ordered   08/29/15 1742  Full code   Continuous     08/29/15 1745    Advance Directive Documentation        Most Recent Value   Type of Advance Directive  Healthcare Power of Attorney, Living will   Pre-existing out of facility DNR order (yellow form or pink MOST form)     "MOST" Form in Place?        TOTAL TIME TAKING CARE OF THIS PATIENT: 35 minutes.    MODY, SITAL M.D on 08/31/2015 at 10:11 AM  Between 7am to 6pm - Pager - [phone] After 6pm go to [email or web address] - password [email or web address] [facility]  Office  [phone]  CC: Primary care physician; Ashok Norris, MD

## 2015-08-31 NOTE — Discharge Instructions (Signed)

## 2015-09-01 LAB — URINE CULTURE: Culture: >=100000

## 2015-09-02 DIAGNOSIS — N3020 Other chronic cystitis without hematuria: Secondary | ICD-10-CM | POA: Insufficient documentation

## 2015-09-02 DIAGNOSIS — D3500 Benign neoplasm of unspecified adrenal gland: Secondary | ICD-10-CM | POA: Insufficient documentation

## 2015-09-02 DIAGNOSIS — R339 Retention of urine, unspecified: Secondary | ICD-10-CM | POA: Insufficient documentation

## 2015-09-02 DIAGNOSIS — N23 Unspecified renal colic: Secondary | ICD-10-CM | POA: Insufficient documentation

## 2015-09-04 ENCOUNTER — Ambulatory Visit: Payer: Medicare Other | Admitting: Family Medicine

## 2015-09-04 ENCOUNTER — Telehealth: Payer: Self-pay | Admitting: Emergency Medicine

## 2015-09-04 NOTE — Telephone Encounter (Signed)
Patient notified, but was seen in ER for bladder infection and UTI.

## 2015-09-11 DIAGNOSIS — N3281 Overactive bladder: Secondary | ICD-10-CM | POA: Insufficient documentation

## 2015-10-08 ENCOUNTER — Ambulatory Visit (INDEPENDENT_AMBULATORY_CARE_PROVIDER_SITE_OTHER): Payer: Medicare Other | Admitting: Family Medicine

## 2015-10-08 ENCOUNTER — Encounter: Payer: Self-pay | Admitting: Family Medicine

## 2015-10-08 VITALS — BP 124/82 | HR 90 | Temp 97.9°F | Resp 16 | Ht 66.0 in | Wt 162.0 lb

## 2015-10-08 DIAGNOSIS — N3001 Acute cystitis with hematuria: Secondary | ICD-10-CM

## 2015-10-08 DIAGNOSIS — R310 Gross hematuria: Secondary | ICD-10-CM | POA: Diagnosis not present

## 2015-10-08 DIAGNOSIS — N3281 Overactive bladder: Secondary | ICD-10-CM | POA: Diagnosis not present

## 2015-10-08 LAB — POCT URINALYSIS DIPSTICK
Bilirubin, UA: NEGATIVE
Blood, UA: NEGATIVE
Glucose, UA: NEGATIVE
Ketones, UA: NEGATIVE
LEUKOCYTES UA: NEGATIVE
Nitrite, UA: NEGATIVE
PH UA: 5
PROTEIN UA: NEGATIVE
Spec Grav, UA: <=1.005
UROBILINOGEN UA: 0.2

## 2015-10-08 NOTE — Progress Notes (Signed)
Name: Donna Evans   MRN: [id number]    DOB: [birth year]   Date:10/08/2015       Progress Note  Subjective  Chief Complaint  Chief Complaint  Patient presents with  . Urinary Tract Infection    HPI  UTI  Patient presents with a 30+ day history of dysuria frequency and urgency.  There has been suprapubic and lower back discomfort.  No fever or chills noted.  There is been no hematuria. There has not been a past history of recurrent UTIs. She is also seen urologist and his cystoscopy. There is a diagnosis of bladder muscle dysfunction  Past Medical History  Diagnosis Date  . Breast cancer ([place])   . Fibromyalgia   . Peripheral neuropathy ([place])   . Low back pain   . TIA (transient ischemic attack) 11/28/2013  . Transient global amnesia 2011  . Migraine headache   . Foot fracture     Bilateral  . Leg fracture, right     Social History  Substance Use Topics  . Smoking status: Former Smoker -- 1.00 packs/day for 40 years    Types: Cigarettes    Quit date: 08/28/2005  . Smokeless tobacco: Never Used     Comment: 9 years  . Alcohol Use: 0.0 oz/week    0 Standard drinks or equivalent per week     Comment: rare     Current outpatient prescriptions:  .  ALPRAZolam (XANAX) 1 MG tablet, Take 2 mg by mouth at bedtime., Disp: , Rfl:  .  aspirin EC 81 MG tablet, Take 81 mg by mouth at bedtime. , Disp: , Rfl:  .  butorphanol (STADOL) 10 MG/ML nasal spray, Place 1 spray into the nose every 3 (three) hours as needed (for pain). , Disp: , Rfl:  .  colestipol (COLESTID) 1 G tablet, Take 1 g by mouth 3 (three) times daily before meals., Disp: , Rfl:  .  cyclobenzaprine (FLEXERIL) 10 MG tablet, Take 10 mg by mouth 3 (three) times daily as needed for muscle spasms., Disp: , Rfl:  .  lidocaine (LIDODERM) 5 %, Place 1 patch onto the skin daily as needed (for pain). Remove & Discard patch within 12 hours or as directed by MD, Disp: , Rfl:  .  tamsulosin (FLOMAX) 0.4 MG CAPS capsule, Take 1  capsule (0.4 mg total) by mouth daily., Disp: 14 capsule, Rfl: 0 .  torsemide (DEMADEX) 20 MG tablet, Take 20 mg by mouth every 14 (fourteen) days., Disp: , Rfl:  .  zolpidem (AMBIEN) 10 MG tablet, Take 10 mg by mouth at bedtime as needed for sleep., Disp: , Rfl:   Allergies  Allergen Reactions  . Ciprofloxacin Hives  . Fluconazole Hives  . Codeine Other (See Comments)    Reaction:  Unknown   . Demerol [Meperidine] Other (See Comments)    Reaction:  Unknown    . Duloxetine Hcl Other (See Comments)    Reaction:  Unknown   . Flagyl [Metronidazole] Other (See Comments)    Reaction:  Unknown   . Hydrocodone-Acetaminophen Hives  . Morphine And Related Other (See Comments)    Reaction:  Unknown   . Amoxicillin-Pot Clavulanate Nausea And Vomiting  . Latex Rash  . Sulfamethoxazole-Trimethoprim Rash  . Valacyclovir Rash    Review of Systems  Gastrointestinal: Positive for abdominal pain.  Genitourinary: Positive for dysuria and urgency.     Objective  Filed Vitals:   10/08/15 1206  BP: 124/82  Pulse: 90  Temp: 97.9  F (36.6 C)  TempSrc: Oral  Resp: 16  Height: 5\' 6"  (1.676 m)  Weight: 162 lb (73.483 kg)  SpO2: 98%     Physical Exam  Constitutional: She is well-developed, well-nourished, and in no distress.  HENT:  Head: Normocephalic.  Abdominal: There is tenderness (right lower quadrant and right suprapubic area without rebound or masses).      Assessment & Plan   1. Acute cystitis with hematuria Continue current antibiotic - POCT urinalysis dipstick  2. Detrusor muscle hypertonia Per urologist  3. Pilar Plate hematuria Resolved

## 2015-10-20 ENCOUNTER — Ambulatory Visit: Payer: Medicare Other | Admitting: Family Medicine

## 2015-10-21 ENCOUNTER — Other Ambulatory Visit: Payer: Self-pay | Admitting: Family Medicine

## 2015-10-22 NOTE — Telephone Encounter (Signed)
Called [facility]. Patient had a refill left for Alprazolam and picked up 10/21/2015

## 2015-11-04 ENCOUNTER — Ambulatory Visit: Payer: Medicare Other | Admitting: Family Medicine

## 2015-11-10 ENCOUNTER — Encounter: Payer: Self-pay | Admitting: Family Medicine

## 2015-11-10 ENCOUNTER — Ambulatory Visit (INDEPENDENT_AMBULATORY_CARE_PROVIDER_SITE_OTHER): Payer: Medicare Other | Admitting: Family Medicine

## 2015-11-10 VITALS — BP 124/72 | HR 86 | Temp 98.0°F | Resp 18 | Ht 66.0 in | Wt 158.6 lb

## 2015-11-10 DIAGNOSIS — N3001 Acute cystitis with hematuria: Secondary | ICD-10-CM | POA: Diagnosis not present

## 2015-11-10 DIAGNOSIS — G894 Chronic pain syndrome: Secondary | ICD-10-CM

## 2015-11-10 DIAGNOSIS — M810 Age-related osteoporosis without current pathological fracture: Secondary | ICD-10-CM | POA: Diagnosis not present

## 2015-11-10 DIAGNOSIS — R238 Other skin changes: Secondary | ICD-10-CM | POA: Diagnosis not present

## 2015-11-10 DIAGNOSIS — M797 Fibromyalgia: Secondary | ICD-10-CM | POA: Diagnosis not present

## 2015-11-10 DIAGNOSIS — F411 Generalized anxiety disorder: Secondary | ICD-10-CM

## 2015-11-10 DIAGNOSIS — R233 Spontaneous ecchymoses: Secondary | ICD-10-CM

## 2015-11-10 LAB — POCT URINALYSIS DIPSTICK
Bilirubin, UA: NEGATIVE
Glucose, UA: NEGATIVE
KETONES UA: NEGATIVE
NITRITE UA: NEGATIVE
PH UA: 6
PROTEIN UA: NEGATIVE
RBC UA: NEGATIVE
Spec Grav, UA: 1.015
Urobilinogen, UA: NEGATIVE

## 2015-11-10 MED ORDER — BUTORPHANOL TARTRATE 10 MG/ML NA SOLN: 1.0000 | NASAL | Status: DC | PRN

## 2015-11-10 NOTE — Progress Notes (Signed)
Name: Donna Evans   MRN: [id number]    DOB: 09-Apr-1946   Date:11/10/2015       Progress Note  Subjective  Chief Complaint  Chief Complaint  Patient presents with  . Cystitis    3 month follow up    HPI  Follow-up cystitis  Patient still has symptoms of urinary frequency. She has completed a course of antibiotic. She is also having care of a urologist and is on Flomax. Her symptomatology present for many years but had any recent exacerbation a few weeks ago which was treated with antibiotic.  Chronic pain  Patient presents for follow-up of chronic pain syndrome. The pain score at worse is 10/10  and at best is 5/10 with medication rest and home remedies.  There is no evidence of any extra dosage or issues of usage outside of the prescribed regimen.  Pain is exacerbated by changes in weather and by strenuous activities. There have been no recent activities to exacerbate the chronic pain. Concomitant medications include Stadol .  Drug screen and medication contract have been renewed within the last 1 year.    Fibromyalgia syndrome  Patient's fibromyalgia symptomatology is worsened with recent change in weather and her recent stressors. She has had from a myalgia from many years. Her pain is usually moderate to severe. Concomitant symptomatology includes 2 headaches. Current medication includes Stadol along with gabapentin  Anxiety  Patient has a chronic history of generalized anxiety for more than 5 years. She is currently on alprazolam 1 mg twice per day usually. Anxiety exacerbated by pain medical and financial as well as social stress stressors as well as her husband's declining health.  Past Medical History  Diagnosis Date  . Breast cancer ([place])   . Fibromyalgia   . Peripheral neuropathy ([place])   . Low back pain   . TIA (transient ischemic attack) 11/28/2013  . Transient global amnesia 2011  . Migraine headache   . Foot fracture     Bilateral  . Leg fracture, right      Social History  Substance Use Topics  . Smoking status: Former Smoker -- 1.00 packs/day for 40 years    Types: Cigarettes    Quit date: 08/28/2005  . Smokeless tobacco: Never Used     Comment: 9 years  . Alcohol Use: 0.0 oz/week    0 Standard drinks or equivalent per week     Comment: rare     Current outpatient prescriptions:  .  ALPRAZolam (XANAX) 1 MG tablet, TAKE ONE TABLET BY MOUTH 3 TIMES DAILY AS NEEDED, Disp: 90 tablet, Rfl: 0 .  aspirin EC 81 MG tablet, Take 81 mg by mouth at bedtime. , Disp: , Rfl:  .  [START ON 12/12/2015] butorphanol (STADOL) 10 MG/ML nasal spray, Place 1 spray into the nose every 3 (three) hours as needed (for pain)., Disp: 2.5 mL, Rfl: 0 .  colestipol (COLESTID) 1 G tablet, Take 1 g by mouth 3 (three) times daily before meals., Disp: , Rfl:  .  cyclobenzaprine (FLEXERIL) 10 MG tablet, Take 10 mg by mouth 3 (three) times daily as needed for muscle spasms., Disp: , Rfl:  .  gabapentin (NEURONTIN) 300 MG capsule, Take 300 mg by mouth., Disp: , Rfl:  .  lidocaine (LIDODERM) 5 %, Place 1 patch onto the skin daily as needed (for pain). Remove & Discard patch within 12 hours or as directed by MD, Disp: , Rfl:  .  tamsulosin (FLOMAX) 0.4 MG CAPS capsule, Take  1 capsule (0.4 mg total) by mouth daily., Disp: 14 capsule, Rfl: 0 .  torsemide (DEMADEX) 20 MG tablet, Take 20 mg by mouth every 14 (fourteen) days., Disp: , Rfl:  .  zolpidem (AMBIEN) 10 MG tablet, Take 10 mg by mouth at bedtime as needed for sleep., Disp: , Rfl:   Allergies  Allergen Reactions  . Ciprofloxacin Hives  . Fluconazole Hives  . Codeine Other (See Comments)    Reaction:  Unknown   . Demerol [Meperidine] Other (See Comments)    Reaction:  Unknown    . Duloxetine Hcl Other (See Comments)    Reaction:  Unknown   . Flagyl [Metronidazole] Other (See Comments)    Reaction:  Unknown   . Hydrocodone-Acetaminophen Hives  . Morphine And Related Other (See Comments)    Reaction:  Unknown    . Amoxicillin-Pot Clavulanate Nausea And Vomiting  . Latex Rash  . Sulfamethoxazole-Trimethoprim Rash  . Valacyclovir Rash    Review of Systems  Constitutional: Positive for malaise/fatigue. Negative for fever, chills and weight loss.  HENT: Negative for congestion, hearing loss, sore throat and tinnitus.   Eyes: Negative for blurred vision, double vision and redness.  Respiratory: Positive for wheezing. Negative for cough, hemoptysis and shortness of breath.   Cardiovascular: Positive for leg swelling. Negative for chest pain, palpitations, orthopnea and claudication.  Gastrointestinal: Negative for heartburn, nausea, vomiting, diarrhea, constipation and blood in stool.  Genitourinary: Positive for dysuria, urgency and frequency. Negative for hematuria.  Musculoskeletal: Positive for myalgias, back pain, joint pain and neck pain. Negative for falls.  Skin: Negative for itching.  Neurological: Positive for focal weakness, weakness and headaches. Negative for dizziness, tingling, tremors, seizures and loss of consciousness.  Endo/Heme/Allergies: Positive for environmental allergies. Does not bruise/bleed easily.  Psychiatric/Behavioral: Positive for depression. Negative for substance abuse. The patient is nervous/anxious and has insomnia.      Objective  Filed Vitals:   11/10/15 1341  BP: 124/72  Pulse: 86  Temp: 98 F (36.7 C)  Resp: 18  Height: 5\' 6"  (1.676 m)  Weight: 158 lb 9 oz (71.923 kg)  SpO2: 97%     Physical Exam  Constitutional: She is oriented to person, place, and time and well-developed, well-nourished, and in no distress.  HENT:  Head: Normocephalic.  Eyes: EOM are normal. Pupils are equal, round, and reactive to light.  Neck: Normal range of motion. No thyromegaly present.  Cardiovascular: Normal rate, regular rhythm and normal heart sounds.   No murmur heard. Pulmonary/Chest: Effort normal and breath sounds normal.  Abdominal: Soft. Bowel sounds are  normal.  Musculoskeletal: She exhibits tenderness (trigger points along the entire axial skeletal area as well as the epitrochlear area). She exhibits no edema.  Neurological: She is alert and oriented to person, place, and time. No cranial nerve deficit. Gait normal.  Skin: Skin is warm and dry. No rash noted.  Several areas of senile purpura  Psychiatric: Memory normal.  They're anxious and loquacious as is her baseline      Assessment & Plan   1. Acute cystitis with hematuria Resolving in follow-up by urologist - POCT urinalysis dipstick  2. Chronic pain syndrome Continue current regimen - butorphanol (STADOL) 10 MG/ML nasal spray; Place 1 spray into the nose every 3 (three) hours as needed (for pain).  Dispense: 2.5 mL; Refill: 0  3. Generalized anxiety disorder Continue current regimen  4. Fibromyalgia syndrome Worsening  5. Osteoporosis Referral for endocrinology for injectable biphosphonate and she's cannot tolerate  oral  6. Easy bruisability  watch for any increase and see if ITP workup is warranted - Ambulatory referral to Endocrinology

## 2015-11-12 ENCOUNTER — Other Ambulatory Visit: Payer: Self-pay | Admitting: Family Medicine

## 2015-11-13 DIAGNOSIS — R238 Other skin changes: Secondary | ICD-10-CM | POA: Insufficient documentation

## 2015-11-13 DIAGNOSIS — M810 Age-related osteoporosis without current pathological fracture: Secondary | ICD-10-CM | POA: Insufficient documentation

## 2015-11-13 DIAGNOSIS — G894 Chronic pain syndrome: Secondary | ICD-10-CM | POA: Insufficient documentation

## 2015-11-13 DIAGNOSIS — R233 Spontaneous ecchymoses: Secondary | ICD-10-CM | POA: Insufficient documentation

## 2015-11-13 DIAGNOSIS — M797 Fibromyalgia: Secondary | ICD-10-CM | POA: Insufficient documentation

## 2015-11-13 DIAGNOSIS — F411 Generalized anxiety disorder: Secondary | ICD-10-CM | POA: Insufficient documentation

## 2015-11-27 ENCOUNTER — Other Ambulatory Visit: Payer: Self-pay | Admitting: Family Medicine

## 2015-12-04 ENCOUNTER — Other Ambulatory Visit: Payer: Self-pay | Admitting: Family Medicine

## 2016-01-27 ENCOUNTER — Other Ambulatory Visit: Payer: Self-pay

## 2016-01-27 MED ORDER — BUTORPHANOL TARTRATE 10 MG/ML NA SOLN: NASAL | Status: DC

## 2016-01-29 ENCOUNTER — Other Ambulatory Visit: Payer: Self-pay | Admitting: Family Medicine

## 2016-02-02 ENCOUNTER — Other Ambulatory Visit: Payer: Self-pay

## 2016-02-02 MED ORDER — HYDROCHLOROTHIAZIDE 12.5 MG PO TABS: 12.5000 mg | ORAL_TABLET | Freq: Two times a day (BID) | ORAL | Status: DC

## 2016-02-04 ENCOUNTER — Encounter: Payer: Self-pay | Admitting: Family Medicine

## 2016-02-04 ENCOUNTER — Ambulatory Visit (INDEPENDENT_AMBULATORY_CARE_PROVIDER_SITE_OTHER): Payer: PPO | Admitting: Family Medicine

## 2016-02-04 VITALS — BP 122/76 | HR 100 | Temp 98.0°F | Resp 18 | Ht 66.0 in | Wt 155.0 lb

## 2016-02-04 DIAGNOSIS — I10 Essential (primary) hypertension: Secondary | ICD-10-CM | POA: Insufficient documentation

## 2016-02-04 DIAGNOSIS — G4700 Insomnia, unspecified: Secondary | ICD-10-CM

## 2016-02-04 DIAGNOSIS — F411 Generalized anxiety disorder: Secondary | ICD-10-CM

## 2016-02-04 DIAGNOSIS — Z23 Encounter for immunization: Secondary | ICD-10-CM | POA: Diagnosis not present

## 2016-02-04 MED ORDER — HYDROCHLOROTHIAZIDE 12.5 MG PO TABS: 12.5000 mg | ORAL_TABLET | Freq: Two times a day (BID) | ORAL | Status: DC

## 2016-02-04 MED ORDER — ESCITALOPRAM OXALATE 10 MG PO TABS: 10.0000 mg | ORAL_TABLET | Freq: Every day | ORAL | Status: DC

## 2016-02-04 MED ORDER — ZOLPIDEM TARTRATE 10 MG PO TABS: 10.0000 mg | ORAL_TABLET | Freq: Every day | ORAL | Status: DC

## 2016-02-04 NOTE — Progress Notes (Signed)
Name: Donna Evans   MRN: [id number]    DOB: [birth year]   Date:02/04/2016       Progress Note  Subjective  Chief Complaint  Chief Complaint  Patient presents with  . Hypertension    patient stated that she has 2 stress events going on which has caused her bp to increase in the afternoons. highest has been 190/103    HPI  HTN: she states her bp is better controlled in am's below 140's/80's, but can go up at night. She states she gets nervous at night - her husband has sundowning and she misses her son that died 65 years ago. She is taking HCTZ twice daily . She does not want to change medication at this time. She has Alprazolam at home and advised to take it at night.   Insomnia: taking Ambien and is working well for her, she denies side effects of medication  GAD: she taking Alprazolam 1 mg three times daily prn, she is willing to start on SSRI, discussed possible side effects.    Patient Active Problem List   Diagnosis Date Noted  . Hypertension, benign 02/04/2016  . Easy bruisability 11/13/2015  . Osteoporosis 11/13/2015  . Fibromyalgia syndrome 11/13/2015  . Generalized anxiety disorder 11/13/2015  . Chronic pain syndrome 11/13/2015  . Detrusor muscle hypertonia 09/11/2015  . Adrenal adenoma 09/02/2015  . Bladder infection, chronic 09/02/2015  . Incomplete bladder emptying 09/02/2015  . Renal colic [id number]  . Frank hematuria 07/28/2015  . History of surgical procedure 06/18/2015  . Absence of breast 11/20/2014  . TIA (transient ischemic attack) 11/28/2013  . Hemiplegia of dominant side ([place]) 11/20/2013    Past Surgical History  Procedure Laterality Date  . Bilateral total mastectomy with axillary lymph node dissection  1990  . Appendectomy  1957  . Cholecystectomy  1979  . Back surgery  1989  . Abdominal hysterectomy  1981  . Liver biopsy  2011    Family History  Problem Relation Age of Onset  . Atrial fibrillation Mother   . Atrial fibrillation Sister   .  Cancer Sister   . Diabetes Sister   . Diabetes Father   . Cancer Sister   . Diabetes Sister   . Atrial fibrillation Sister   . Kidney disease Maternal Aunt     Social History   Social History  . Marital Status: Married    Spouse Name: N/A  . Number of Children: 1  . Years of Education: college3   Occupational History  . Retired    Social History Main Topics  . Smoking status: Former Smoker -- 1.00 packs/day for 40 years    Types: Cigarettes    Quit date: 08/28/2005  . Smokeless tobacco: Never Used     Comment: 9 years  . Alcohol Use: 0.0 oz/week    0 Standard drinks or equivalent per week     Comment: rare  . Drug Use: No  . Sexual Activity: Not on file   Other Topics Concern  . Not on file   Social History Narrative     Current outpatient prescriptions:  .  ALPRAZolam (XANAX) 1 MG tablet, TAKE ONE TABLET BY MOUTH 3 TIMES DAILY AS NEEDED, Disp: 90 tablet, Rfl: 2 .  aspirin EC 81 MG tablet, Take 81 mg by mouth at bedtime. , Disp: , Rfl:  .  butorphanol (STADOL) 10 MG/ML nasal spray, TAKE 1 SPRAY IN EACH NOSTIL EVERY 3 HOURS AS NEEDED FOR SEVERE PAIN, Disp: 15 mL,  Rfl: 2 .  hydrochlorothiazide (HYDRODIURIL) 12.5 MG tablet, Take 1 tablet (12.5 mg total) by mouth 2 (two) times daily., Disp: 60 tablet, Rfl: 5 .  torsemide (DEMADEX) 20 MG tablet, Take 20 mg by mouth every 14 (fourteen) days., Disp: , Rfl:  .  zolpidem (AMBIEN) 10 MG tablet, Take 1 tablet (10 mg total) by mouth at bedtime., Disp: 30 tablet, Rfl: 2 .  lidocaine (LIDODERM) 5 %, Place 1 patch onto the skin daily as needed (for pain). Reported on 02/04/2016, Disp: , Rfl:   Allergies  Allergen Reactions  . Ciprofloxacin Hives  . Fluconazole Hives  . Codeine Other (See Comments)    Reaction:  Unknown   . Demerol [Meperidine] Other (See Comments)    Reaction:  Unknown    . Duloxetine Hcl Other (See Comments)    Reaction:  Unknown   . Flagyl [Metronidazole] Other (See Comments)    Reaction:  Unknown   .  Hydrocodone-Acetaminophen Hives  . Influenza Vaccines Swelling  . Morphine And Related Other (See Comments)    Reaction:  Unknown   . Amoxicillin-Pot Clavulanate Nausea And Vomiting  . Latex Rash  . Sulfamethoxazole-Trimethoprim Rash  . Valacyclovir Rash     ROS  Constitutional: Negative for fever or weight change.  Respiratory: Negative for cough and shortness of breath.   Cardiovascular: Negative for chest pain ,positive  occasional  palpitations.  Gastrointestinal: Negative for abdominal pain, no bowel changes.  Musculoskeletal: Negative for gait problem or joint swelling.  Skin: Negative for rash.  Neurological: Negative for dizziness or headache.  No other specific complaints in a complete review of systems (except as listed in HPI above).  Objective  Filed Vitals:   02/04/16 1131 02/04/16 1213  BP: 122/76   Pulse: 114 100  Temp: 98 F (36.7 C)   TempSrc: Oral   Resp: 18   Height: 5\' 6"  (1.676 m)   Weight: 155 lb (70.308 kg)   SpO2: 98%     Body mass index is 25.03 kg/(m^2).  Physical Exam  Constitutional: Patient appears well-developed and well-nourished. No distress.  HEENT: head atraumatic, normocephalic, pupils equal and reactive to light, neck supple, throat within normal limits Cardiovascular: Normal rate, regular rhythm and normal heart sounds.  No murmur heard. No BLE edema. Pulmonary/Chest: Effort normal and breath sounds normal. No respiratory distress. Abdominal: Soft.  There is no tenderness. Psychiatric: Patient has a normal mood and affect. behavior is normal. Judgment and thought content normal.  Recent Results (from the past 2160 hour(s))  POCT urinalysis dipstick     Status: Abnormal   Collection Time: 11/10/15  1:52 PM  Result Value Ref Range   Color, UA yellow    Clarity, UA dark    Glucose, UA neg    Bilirubin, UA neg    Ketones, UA neg    Spec Grav, UA 1.015    Blood, UA neg    pH, UA 6.0    Protein, UA neg    Urobilinogen, UA  negative    Nitrite, UA neg    Leukocytes, UA Trace (A) Negative     PHQ2/9: Depression screen [facility] 2/9 02/04/2016 10/08/2015 08/27/2015  Decreased Interest 0 0 0  Down, Depressed, Hopeless 0 0 0  PHQ - 2 Score 0 0 0     Fall Risk: Fall Risk  02/04/2016 10/08/2015 08/27/2015 07/28/2015 06/17/2015  Falls in the past year? No Yes No No No  Number falls in past yr: - 2 or more 1  1 -  Injury with Fall? - Yes Yes Yes -    Functional Status Survey: Is the patient deaf or have difficulty hearing?: No Does the patient have difficulty seeing, even when wearing glasses/contacts?: No Does the patient have difficulty concentrating, remembering, or making decisions?: No Does the patient have difficulty walking or climbing stairs?: No Does the patient have difficulty dressing or bathing?: No Does the patient have difficulty doing errands alone such as visiting a doctor's office or shopping?: No    Assessment & Plan  1. Hypertension, benign  - hydrochlorothiazide (HYDRODIURIL) 12.5 MG tablet; Take 1 tablet (12.5 mg total) by mouth 2 (two) times daily.  Dispense: 60 tablet; Refill: 5  2. Insomnia  - zolpidem (AMBIEN) 10 MG tablet; Take 1 tablet (10 mg total) by mouth at bedtime.  Dispense: 30 tablet; Refill: 2  3. Needs flu shot  - Flu vaccine HIGH DOSE PF - allergic   4. Generalized anxiety disorder  - escitalopram (LEXAPRO) 10 MG tablet; Take 1 tablet (10 mg total) by mouth daily.  Dispense: 30 tablet; Refill: 2

## 2016-02-09 DIAGNOSIS — Z8742 Personal history of other diseases of the female genital tract: Secondary | ICD-10-CM | POA: Diagnosis not present

## 2016-02-09 DIAGNOSIS — Z9013 Acquired absence of bilateral breasts and nipples: Secondary | ICD-10-CM | POA: Diagnosis not present

## 2016-02-09 DIAGNOSIS — T8542XD Displacement of breast prosthesis and implant, subsequent encounter: Secondary | ICD-10-CM | POA: Diagnosis not present

## 2016-02-10 ENCOUNTER — Ambulatory Visit: Payer: Medicare Other | Admitting: Family Medicine

## 2016-02-12 DIAGNOSIS — R0602 Shortness of breath: Secondary | ICD-10-CM | POA: Diagnosis not present

## 2016-02-12 DIAGNOSIS — R0789 Other chest pain: Secondary | ICD-10-CM | POA: Diagnosis not present

## 2016-02-12 DIAGNOSIS — R002 Palpitations: Secondary | ICD-10-CM | POA: Diagnosis not present

## 2016-02-26 ENCOUNTER — Other Ambulatory Visit: Payer: Self-pay

## 2016-02-26 ENCOUNTER — Other Ambulatory Visit: Payer: Self-pay | Admitting: Family Medicine

## 2016-02-26 MED ORDER — ALPRAZOLAM 1 MG PO TABS: ORAL_TABLET | ORAL | Status: DC

## 2016-03-02 DIAGNOSIS — R0602 Shortness of breath: Secondary | ICD-10-CM | POA: Diagnosis not present

## 2016-03-08 ENCOUNTER — Telehealth: Payer: Self-pay | Admitting: Family Medicine

## 2016-03-08 NOTE — Telephone Encounter (Signed)
PT SAID SHE HAS HAD THIS DONE BEFORE WHERE THEY PUT INJECTIONS IN EACH HIP . WAS ABOUT 11 YRS. PLEASE CALL AND LET HER KNOW WHAT SHE NEEDS OT DO TO GET THIS DONE. SHE WENT TO DR Glennon Hamilton @ [place]. DOES [facility] DO THIS SHE WANTS TO KNOW.

## 2016-03-09 NOTE — Telephone Encounter (Signed)
I can evaluate her and give her the injections if she has trochanteric bursitis

## 2016-03-09 NOTE — Telephone Encounter (Signed)
DR Ancil Boozer WOULD YOU BE WILLING TO DO THIS INJECTION OR WILL THE PT HAVE TO SEE YOU AND YOU REFER HER OUT? PLEASE ADVISE

## 2016-03-09 NOTE — Telephone Encounter (Signed)
Please make her an appointment with [facility] if available so that she may discuss this. Pt did have this done in the past but she was sent to ortho for it and Dr. Ancil Boozer also does them but I would like for her to make that decision if she would like to perform it or send pt back to ortho.

## 2016-03-10 DIAGNOSIS — R002 Palpitations: Secondary | ICD-10-CM | POA: Diagnosis not present

## 2016-03-11 ENCOUNTER — Ambulatory Visit: Payer: PPO | Admitting: Family Medicine

## 2016-03-18 ENCOUNTER — Ambulatory Visit: Payer: PPO | Admitting: Family Medicine

## 2016-03-29 ENCOUNTER — Other Ambulatory Visit: Payer: Self-pay | Admitting: Family Medicine

## 2016-03-30 ENCOUNTER — Other Ambulatory Visit: Payer: Self-pay | Admitting: Family Medicine

## 2016-03-31 NOTE — Telephone Encounter (Signed)
Patient requesting refill. 

## 2016-04-01 ENCOUNTER — Other Ambulatory Visit: Payer: Self-pay

## 2016-04-01 DIAGNOSIS — G4700 Insomnia, unspecified: Secondary | ICD-10-CM

## 2016-04-01 MED ORDER — ALPRAZOLAM 1 MG PO TABS: ORAL_TABLET | ORAL | Status: DC

## 2016-04-01 MED ORDER — ZOLPIDEM TARTRATE 10 MG PO TABS: 10.0000 mg | ORAL_TABLET | Freq: Every day | ORAL | Status: DC

## 2016-04-19 DIAGNOSIS — Z9889 Other specified postprocedural states: Secondary | ICD-10-CM | POA: Diagnosis not present

## 2016-04-19 DIAGNOSIS — Z9013 Acquired absence of bilateral breasts and nipples: Secondary | ICD-10-CM | POA: Diagnosis not present

## 2016-04-21 ENCOUNTER — Other Ambulatory Visit: Payer: Self-pay | Admitting: Family Medicine

## 2016-04-21 DIAGNOSIS — Z1231 Encounter for screening mammogram for malignant neoplasm of breast: Secondary | ICD-10-CM

## 2016-04-27 ENCOUNTER — Ambulatory Visit
Admission: RE | Admit: 2016-04-27 | Discharge: 2016-04-27 | Disposition: A | Discharge status: Home / Self Care | Payer: PPO | Source: Ambulatory Visit | Attending: Family Medicine | Admitting: Family Medicine

## 2016-04-27 DIAGNOSIS — Z1231 Encounter for screening mammogram for malignant neoplasm of breast: Secondary | ICD-10-CM | POA: Diagnosis not present

## 2016-04-28 ENCOUNTER — Other Ambulatory Visit: Payer: Self-pay | Admitting: Family Medicine

## 2016-05-04 ENCOUNTER — Encounter (HOSPITAL_BASED_OUTPATIENT_CLINIC_OR_DEPARTMENT_OTHER): Payer: Self-pay | Admitting: *Deleted

## 2016-05-07 ENCOUNTER — Other Ambulatory Visit: Payer: Self-pay

## 2016-05-07 ENCOUNTER — Encounter (HOSPITAL_BASED_OUTPATIENT_CLINIC_OR_DEPARTMENT_OTHER)
Admission: RE | Admit: 2016-05-07 | Discharge: 2016-05-07 | Disposition: A | Discharge status: Home / Self Care | Payer: PPO | Source: Ambulatory Visit | Attending: Plastic Surgery | Admitting: Plastic Surgery

## 2016-05-07 DIAGNOSIS — Z01810 Encounter for preprocedural cardiovascular examination: Secondary | ICD-10-CM | POA: Diagnosis not present

## 2016-05-07 LAB — BASIC METABOLIC PANEL
ANION GAP: 8 (ref 5–15)
BUN: 12 mg/dL (ref 6–20)
CALCIUM: 9.3 mg/dL (ref 8.9–10.3)
CO2: 31 mmol/L (ref 22–32)
Chloride: 103 mmol/L (ref 101–111)
Creatinine, Ser: 1.06 mg/dL — ABNORMAL HIGH (ref 0.44–1.00)
GFR calc non Af Amer: 52 mL/min — ABNORMAL LOW (ref >60)
Glucose, Bld: 105 mg/dL — ABNORMAL HIGH (ref 65–99)
POTASSIUM: 4.1 mmol/L (ref 3.5–5.1)
Sodium: 142 mmol/L (ref 135–145)

## 2016-05-07 NOTE — H&P (Signed)
Subjective:    Patient ID: Donna Evans is a 70 y.o. female.  Follow-up   Returns for follow up evaluation breast implant displacement following fall 10.2015.   She reports right breast fuller superior and laterally since time of fall. Her history significant for abnormalities on MMG in late 1980s and was followed q 6 mo with imaging. Developed change in right breast and had aspiration or biospy of mass. No clear diagnosis of cancer per pt but was recommended to have bilateral mastectomies. Bilat SQ mastectomies performed by plastic surgeon and had expanders followed by placement silicone implants. Silicone implant on left leaked in 2-3 years and both exchanged for saline. S Cannot wear bra presently due to pain and change in chest appearance, was a 34 D. No chemotherapy or XRT in past. Mother and sister with breast cancer. Patient told she had severe fibrocystic breasts. Had MMG 04/2016, normal exam and noted to have fatty replacement breasts. She states she called and spoke with staff and reported she has 580 ml size implants; we have no record of any call. Today she states it was 551 ml; reviewed what she reported during prior visit and she states they were different sizes on each side. She has stated she has OP note at home but put it back in box in attic after calling and will need someone to get down as I asked for copy of operative note- she has not done this and asked her to get [place] notes as will help with choosing size and profile. She desires to return to silicone implants. Desires to be D cup. She reports fullness UOQ on right, pain over chest wall lateral, worse on right, and flat appearance of breasts as her major concerns.  Has severe allergy to pain medications/narcotics and has to premedicate with Benadryl to take half a hydrocodone. Has taken tramadol in past. States Stadol most effective.   Review of Systems Underwent MRI and EEG for report confusion episodic with history  TIA, both normal.    Objective:   Physical Exam  Cardiovascular: Normal rate, regular rhythm and normal heart sounds.  Pulmonary/Chest: Effort normal and breath sounds normal.  Genitourinary:  Genitourinary Comments: bilat IMF scar, no palpable masses, both soft, no contracture Asymmetry of position with right implant laterally and superiorly displaced.Left breast with protuberance medial inner quadrant No rippling   SN to nipple R 23 cm L 24 cm BW R 15 L 14 cm Nipple to IMF R 6 L 6 cm Able to pinch at least 3 cm soft tissue over implants   NAC on left located below most prominent portion of implant mound, also true on right though less severe  Assessment:     Acquired absence bilateral breasts History fibrocystic disease     Plan:  I have counseled in past that I suspect may have also incurred some hematoma and edema right side causing the upper pole fullness. She subjectively does not feel this has ever resolved. She states desires to have normal appearing breasts and remove the fullness superiorly; she points to bilateral UOQ/axillary fat when discussing this. Counseled that changes to implants or capsule will not change that specific area; this would require liposuction, though given age and skin quality this skin may not contract, or direct excision. Again counseled that she has some remaining breast tissue and would continue with surveillance. Reviewed that NAC position not in ideal position over central breast mound, this would be addressed with mastopexy with resultant scars around  NAC, vertical and possible anchor. Reviewed possible drains. Counseled I cannot assure her that this will alleviate her pain. She desires change to silicone. Provided Inspira implant booklet. Reviewed risks implants including infection requiring removal or exchange, contracture, rupture, malposition. Plan smooth round silicone .  Reviewed risks surgery including but not limited wound healing problems,  anesthesia, seroma,hematoma, asymmetry, unacceptable cosmetic result, DVT/PE, cardiopulmonary complications, need for additional surgery, damage to deeper structures, loss nipple, diminished sensation breast and nipple.  Irene Limbo, MD [facility] Plastic & Reconstructive Surgery [phone], pin [phone]

## 2016-05-11 ENCOUNTER — Encounter (HOSPITAL_BASED_OUTPATIENT_CLINIC_OR_DEPARTMENT_OTHER)
Admission: RE | Disposition: A | Discharge status: Home / Self Care | Payer: Self-pay | Source: Ambulatory Visit | Attending: Plastic Surgery

## 2016-05-11 ENCOUNTER — Encounter (HOSPITAL_BASED_OUTPATIENT_CLINIC_OR_DEPARTMENT_OTHER): Payer: Self-pay | Admitting: *Deleted

## 2016-05-11 ENCOUNTER — Ambulatory Visit (HOSPITAL_BASED_OUTPATIENT_CLINIC_OR_DEPARTMENT_OTHER): Payer: PPO | Admitting: Certified Registered"

## 2016-05-11 ENCOUNTER — Ambulatory Visit (HOSPITAL_BASED_OUTPATIENT_CLINIC_OR_DEPARTMENT_OTHER)
Admission: RE | Admit: 2016-05-11 | Discharge: 2016-05-11 | Disposition: A | Discharge status: Home / Self Care | Payer: PPO | Source: Ambulatory Visit | Attending: Plastic Surgery | Admitting: Plastic Surgery

## 2016-05-11 DIAGNOSIS — Z803 Family history of malignant neoplasm of breast: Secondary | ICD-10-CM | POA: Insufficient documentation

## 2016-05-11 DIAGNOSIS — Z9010 Acquired absence of unspecified breast and nipple: Secondary | ICD-10-CM | POA: Diagnosis not present

## 2016-05-11 DIAGNOSIS — M797 Fibromyalgia: Secondary | ICD-10-CM | POA: Diagnosis not present

## 2016-05-11 DIAGNOSIS — Z421 Encounter for breast reconstruction following mastectomy: Secondary | ICD-10-CM | POA: Insufficient documentation

## 2016-05-11 DIAGNOSIS — Z9882 Breast implant status: Secondary | ICD-10-CM | POA: Diagnosis not present

## 2016-05-11 DIAGNOSIS — Z8673 Personal history of transient ischemic attack (TIA), and cerebral infarction without residual deficits: Secondary | ICD-10-CM | POA: Diagnosis not present

## 2016-05-11 DIAGNOSIS — K219 Gastro-esophageal reflux disease without esophagitis: Secondary | ICD-10-CM | POA: Insufficient documentation

## 2016-05-11 DIAGNOSIS — I10 Essential (primary) hypertension: Secondary | ICD-10-CM | POA: Insufficient documentation

## 2016-05-11 DIAGNOSIS — T8542XD Displacement of breast prosthesis and implant, subsequent encounter: Secondary | ICD-10-CM | POA: Diagnosis not present

## 2016-05-11 DIAGNOSIS — F419 Anxiety disorder, unspecified: Secondary | ICD-10-CM | POA: Insufficient documentation

## 2016-05-11 DIAGNOSIS — Z9013 Acquired absence of bilateral breasts and nipples: Secondary | ICD-10-CM | POA: Diagnosis not present

## 2016-05-11 HISTORY — DX: Essential (primary) hypertension: I10

## 2016-05-11 HISTORY — PX: BREAST IMPLANT EXCHANGE: SHX6296

## 2016-05-11 HISTORY — PX: LIPOSUCTION: SHX10

## 2016-05-11 HISTORY — PX: MASTOPEXY: SHX5358

## 2016-05-11 HISTORY — PX: CAPSULECTOMY: SHX5381

## 2016-05-11 SURGERY — REPLACEMENT, IMPLANT, BREAST
Anesthesia: General | Site: Chest | Laterality: Right

## 2016-05-11 MED ORDER — 0.9 % SODIUM CHLORIDE (POUR BTL) OPTIME
TOPICAL | Status: DC | PRN
  Administered 2016-05-11: 1000 mL

## 2016-05-11 MED ORDER — DEXAMETHASONE SODIUM PHOSPHATE 10 MG/ML IJ SOLN
INTRAMUSCULAR | Status: AC
  Filled 2016-05-11: qty 1

## 2016-05-11 MED ORDER — CEFAZOLIN SODIUM-DEXTROSE 2-4 GM/100ML-% IV SOLN
INTRAVENOUS | Status: AC
  Filled 2016-05-11: qty 100

## 2016-05-11 MED ORDER — ACETAMINOPHEN 10 MG/ML IV SOLN
INTRAVENOUS | Status: DC | PRN
  Administered 2016-05-11: 1000 mg via INTRAVENOUS

## 2016-05-11 MED ORDER — TRAMADOL HCL 50 MG PO TABS
ORAL_TABLET | ORAL | Status: AC
  Filled 2016-05-11: qty 1

## 2016-05-11 MED ORDER — EPHEDRINE 5 MG/ML INJ
INTRAVENOUS | Status: AC
  Filled 2016-05-11: qty 10

## 2016-05-11 MED ORDER — PROMETHAZINE HCL 25 MG/ML IJ SOLN
6.2500 mg | INTRAMUSCULAR | Status: DC | PRN
Start: 2016-05-11 — End: 2016-05-11

## 2016-05-11 MED ORDER — LIDOCAINE-EPINEPHRINE 1 %-1:100000 IJ SOLN
INTRAMUSCULAR | Status: AC
  Filled 2016-05-11: qty 1

## 2016-05-11 MED ORDER — BUPIVACAINE-EPINEPHRINE (PF) 0.25% -1:200000 IJ SOLN
INTRAMUSCULAR | Status: AC
  Filled 2016-05-11: qty 30

## 2016-05-11 MED ORDER — FENTANYL CITRATE (PF) 100 MCG/2ML IJ SOLN
INTRAMUSCULAR | Status: AC
  Filled 2016-05-11: qty 2

## 2016-05-11 MED ORDER — SUCCINYLCHOLINE CHLORIDE 20 MG/ML IJ SOLN
INTRAMUSCULAR | Status: DC | PRN
  Administered 2016-05-11: 100 mg via INTRAVENOUS

## 2016-05-11 MED ORDER — BUPIVACAINE-EPINEPHRINE 0.25% -1:200000 IJ SOLN
INTRAMUSCULAR | Status: DC | PRN
  Administered 2016-05-11: 30 mL

## 2016-05-11 MED ORDER — CHLORHEXIDINE GLUCONATE 4 % EX LIQD: 1.0000 "application " | Freq: Once | CUTANEOUS | Status: DC

## 2016-05-11 MED ORDER — MIDAZOLAM HCL 2 MG/2ML IJ SOLN: 1.0000 mg | INTRAMUSCULAR | Status: DC | PRN

## 2016-05-11 MED ORDER — PROPOFOL 10 MG/ML IV BOLUS
INTRAVENOUS | Status: DC | PRN
  Administered 2016-05-11: 200 mg via INTRAVENOUS

## 2016-05-11 MED ORDER — ONDANSETRON HCL 4 MG/2ML IJ SOLN
INTRAMUSCULAR | Status: DC | PRN
  Administered 2016-05-11: 4 mg via INTRAVENOUS

## 2016-05-11 MED ORDER — LIDOCAINE HCL (PF) 1 % IJ SOLN
INTRAMUSCULAR | Status: AC
  Filled 2016-05-11: qty 60

## 2016-05-11 MED ORDER — GLYCOPYRROLATE 0.2 MG/ML IJ SOLN: 0.2000 mg | Freq: Once | INTRAMUSCULAR | Status: DC | PRN

## 2016-05-11 MED ORDER — BUTORPHANOL TARTRATE 10 MG/ML NA SOLN: NASAL | Status: DC

## 2016-05-11 MED ORDER — ONDANSETRON HCL 4 MG/2ML IJ SOLN
INTRAMUSCULAR | Status: AC
  Filled 2016-05-11: qty 2

## 2016-05-11 MED ORDER — LIDOCAINE 2% (20 MG/ML) 5 ML SYRINGE
INTRAMUSCULAR | Status: AC
  Filled 2016-05-11: qty 5

## 2016-05-11 MED ORDER — FENTANYL CITRATE (PF) 100 MCG/2ML IJ SOLN
25.0000 ug | INTRAMUSCULAR | Status: DC | PRN
  Administered 2016-05-11 (×2): 25 ug via INTRAVENOUS
  Administered 2016-05-11: 50 ug via INTRAVENOUS
  Administered 2016-05-11: 25 ug via INTRAVENOUS

## 2016-05-11 MED ORDER — SODIUM BICARBONATE 4 % IV SOLN
INTRAVENOUS | Status: AC
  Filled 2016-05-11: qty 10

## 2016-05-11 MED ORDER — SUCCINYLCHOLINE CHLORIDE 200 MG/10ML IV SOSY
PREFILLED_SYRINGE | INTRAVENOUS | Status: AC
  Filled 2016-05-11: qty 10

## 2016-05-11 MED ORDER — LIDOCAINE HCL (CARDIAC) 20 MG/ML IV SOLN
INTRAVENOUS | Status: DC | PRN
Start: 2016-05-11 — End: 2016-05-11
  Administered 2016-05-11: 60 mg via INTRAVENOUS

## 2016-05-11 MED ORDER — EPHEDRINE SULFATE 50 MG/ML IJ SOLN
INTRAMUSCULAR | Status: DC | PRN
  Administered 2016-05-11 (×3): 10 mg via INTRAVENOUS

## 2016-05-11 MED ORDER — TRAMADOL HCL 50 MG PO TABS
50.0000 mg | ORAL_TABLET | Freq: Once | ORAL | Status: AC
  Administered 2016-05-11: 50 mg via ORAL

## 2016-05-11 MED ORDER — FENTANYL CITRATE (PF) 100 MCG/2ML IJ SOLN
50.0000 ug | INTRAMUSCULAR | Status: AC | PRN
  Administered 2016-05-11 (×2): 50 ug via INTRAVENOUS
  Administered 2016-05-11: 25 ug via INTRAVENOUS

## 2016-05-11 MED ORDER — ACETAMINOPHEN 10 MG/ML IV SOLN
INTRAVENOUS | Status: AC
  Filled 2016-05-11: qty 100

## 2016-05-11 MED ORDER — CLINDAMYCIN HCL 300 MG PO CAPS: 300.0000 mg | ORAL_CAPSULE | Freq: Three times a day (TID) | ORAL | Status: DC

## 2016-05-11 MED ORDER — SODIUM CHLORIDE 0.9 % IV SOLN
INTRAVENOUS | Status: DC | PRN
  Administered 2016-05-11: 1000 mL

## 2016-05-11 MED ORDER — DEXAMETHASONE SODIUM PHOSPHATE 4 MG/ML IJ SOLN
INTRAMUSCULAR | Status: DC | PRN
Start: 2016-05-11 — End: 2016-05-11
  Administered 2016-05-11: 10 mg via INTRAVENOUS

## 2016-05-11 MED ORDER — SODIUM CHLORIDE 0.9 % IJ SOLN
INTRAMUSCULAR | Status: AC
  Filled 2016-05-11: qty 30

## 2016-05-11 MED ORDER — EPINEPHRINE HCL 1 MG/ML IJ SOLN
INTRAMUSCULAR | Status: AC
  Filled 2016-05-11: qty 1

## 2016-05-11 MED ORDER — LACTATED RINGERS IV SOLN
INTRAVENOUS | Status: DC
  Administered 2016-05-11 (×3): via INTRAVENOUS

## 2016-05-11 MED ORDER — SCOPOLAMINE 1 MG/3DAYS TD PT72: 1.0000 | MEDICATED_PATCH | Freq: Once | TRANSDERMAL | Status: DC | PRN

## 2016-05-11 MED ORDER — CEFAZOLIN SODIUM-DEXTROSE 2-4 GM/100ML-% IV SOLN
2.0000 g | INTRAVENOUS | Status: AC
Start: 2016-05-11 — End: 2016-05-11
  Administered 2016-05-11: 2 g via INTRAVENOUS

## 2016-05-11 SURGICAL SUPPLY — 91 items
BAG DECANTER FOR FLEXI CONT (MISCELLANEOUS) ×5 IMPLANT
BANDAGE ACE 6X5 VEL STRL LF (GAUZE/BANDAGES/DRESSINGS) IMPLANT
BINDER BREAST LRG (GAUZE/BANDAGES/DRESSINGS) ×5 IMPLANT
BINDER BREAST MEDIUM (GAUZE/BANDAGES/DRESSINGS) IMPLANT
BINDER BREAST XLRG (GAUZE/BANDAGES/DRESSINGS) IMPLANT
BINDER BREAST XXLRG (GAUZE/BANDAGES/DRESSINGS) IMPLANT
BIOPATCH RED 1 DISK 7.0 (GAUZE/BANDAGES/DRESSINGS) IMPLANT
BIOPATCH RED 1IN DISK 7.0MM (GAUZE/BANDAGES/DRESSINGS)
BLADE SURG 10 STRL SS (BLADE) ×10 IMPLANT
BLADE SURG 15 STRL LF DISP TIS (BLADE) ×3 IMPLANT
BLADE SURG 15 STRL SS (BLADE) ×2
BNDG GAUZE ELAST 4 BULKY (GAUZE/BANDAGES/DRESSINGS) ×10 IMPLANT
CANISTER SUCT 1200ML W/VALVE (MISCELLANEOUS) ×10 IMPLANT
CHLORAPREP W/TINT 26ML (MISCELLANEOUS) ×5 IMPLANT
CLOSURE WOUND 1/2 X4 (GAUZE/BANDAGES/DRESSINGS)
COVER MAYO STAND STRL (DRAPES) ×5 IMPLANT
DECANTER SPIKE VIAL GLASS SM (MISCELLANEOUS) IMPLANT
DRAIN CHANNEL 15F RND FF W/TCR (WOUND CARE) IMPLANT
DRAIN CHANNEL 19F RND (DRAIN) IMPLANT
DRSG PAD ABDOMINAL 8X10 ST (GAUZE/BANDAGES/DRESSINGS) ×10 IMPLANT
DRSG TEGADERM 2-3/8X2-3/4 SM (GAUZE/BANDAGES/DRESSINGS) IMPLANT
DRSG TEGADERM 4X4.75 (GAUZE/BANDAGES/DRESSINGS) IMPLANT
ELECT BLADE 4.0 EZ CLEAN MEGAD (MISCELLANEOUS) ×5
ELECT COATED BLADE 2.86 ST (ELECTRODE) ×5 IMPLANT
ELECT REM PT RETURN 9FT ADLT (ELECTROSURGICAL) ×5
ELECTRODE BLDE 4.0 EZ CLN MEGD (MISCELLANEOUS) ×3 IMPLANT
ELECTRODE REM PT RTRN 9FT ADLT (ELECTROSURGICAL) ×3 IMPLANT
EVACUATOR SILICONE 100CC (DRAIN) IMPLANT
GAUZE SPONGE 4X4 12PLY STRL (GAUZE/BANDAGES/DRESSINGS) IMPLANT
GLOVE BIO SURGEON STRL SZ 6 (GLOVE) IMPLANT
GLOVE BIO SURGEON STRL SZ 6.5 (GLOVE) IMPLANT
GLOVE BIO SURGEONS STRL SZ 6.5 (GLOVE)
GLOVE BIOGEL PI IND STRL 7.0 (GLOVE) ×6 IMPLANT
GLOVE BIOGEL PI INDICATOR 7.0 (GLOVE) ×4
GLOVE EXAM NITRILE EXT CUFF MD (GLOVE) ×5 IMPLANT
GLOVE SURG SS PI 6.0 STRL IVOR (GLOVE) ×15 IMPLANT
GLOVE SURG SS PI 6.5 STRL IVOR (GLOVE) ×10 IMPLANT
GOWN STRL REUS W/ TWL LRG LVL3 (GOWN DISPOSABLE) ×12 IMPLANT
GOWN STRL REUS W/TWL LRG LVL3 (GOWN DISPOSABLE) ×8
IMPLANT BREAST GEL 605CC (Breast) ×10 IMPLANT
IV NS 500ML (IV SOLUTION)
IV NS 500ML BAXH (IV SOLUTION) IMPLANT
KIT FILL SYSTEM UNIVERSAL (SET/KITS/TRAYS/PACK) IMPLANT
LINER CANISTER 1000CC FLEX (MISCELLANEOUS) ×5 IMPLANT
LIQUID BAND (GAUZE/BANDAGES/DRESSINGS) ×10 IMPLANT
NDL SAFETY ECLIPSE 18X1.5 (NEEDLE) ×3 IMPLANT
NEEDLE HYPO 18GX1.5 SHARP (NEEDLE) ×2
NEEDLE HYPO 25X1 1.5 SAFETY (NEEDLE) ×5 IMPLANT
NS IRRIG 1000ML POUR BTL (IV SOLUTION) ×5 IMPLANT
PACK BASIN DAY SURGERY FS (CUSTOM PROCEDURE TRAY) ×5 IMPLANT
PACK UNIVERSAL I (CUSTOM PROCEDURE TRAY) ×5 IMPLANT
PENCIL BUTTON HOLSTER BLD 10FT (ELECTRODE) ×5 IMPLANT
PIN SAFETY STERILE (MISCELLANEOUS) ×5 IMPLANT
SHEET MEDIUM DRAPE 40X70 STRL (DRAPES) ×5 IMPLANT
SIZER BREAST GENERIC (SIZER) ×10 IMPLANT
SIZER BREAST REUSE 560CC (SIZER) ×2
SIZER BREAST REUSE 605CC (SIZER) ×2
SIZER BREAST REUSE GEL 580CC (SIZER) ×5
SIZER BRST REUSE GEL 580CC (SIZER) ×3 IMPLANT
SIZER BRST REUSE P5.7X 560CC (SIZER) ×3 IMPLANT
SIZER BRST REUSE P5.8X 605CC (SIZER) ×3 IMPLANT
SLEEVE SCD COMPRESS KNEE MED (MISCELLANEOUS) ×5 IMPLANT
SPONGE GAUZE 4X4 12PLY STER LF (GAUZE/BANDAGES/DRESSINGS) IMPLANT
SPONGE LAP 18X18 X RAY DECT (DISPOSABLE) ×15 IMPLANT
STAPLER VISISTAT 35W (STAPLE) ×5 IMPLANT
STRIP CLOSURE SKIN 1/2X4 (GAUZE/BANDAGES/DRESSINGS) IMPLANT
SUT ETHILON 2 0 FS 18 (SUTURE) IMPLANT
SUT MNCRL AB 4-0 PS2 18 (SUTURE) ×10 IMPLANT
SUT MON AB 3-0 SH 27 (SUTURE)
SUT MON AB 3-0 SH27 (SUTURE) IMPLANT
SUT MON AB 5-0 PS2 18 (SUTURE) IMPLANT
SUT PDS 3-0 CT2 (SUTURE)
SUT PDS AB 2-0 CT2 27 (SUTURE) ×5 IMPLANT
SUT PDS II 3-0 CT2 27 ABS (SUTURE) IMPLANT
SUT PROLENE 3 0 PS 1 (SUTURE) ×5 IMPLANT
SUT SILK 3 0 PS 1 (SUTURE) IMPLANT
SUT VIC AB 3-0 PS1 18 (SUTURE) ×2
SUT VIC AB 3-0 PS1 18XBRD (SUTURE) ×3 IMPLANT
SUT VIC AB 3-0 SH 27 (SUTURE) ×4
SUT VIC AB 3-0 SH 27X BRD (SUTURE) ×6 IMPLANT
SUT VICRYL 4-0 PS2 18IN ABS (SUTURE) ×20 IMPLANT
SYR 50ML LL SCALE MARK (SYRINGE) IMPLANT
SYR BULB IRRIGATION 50ML (SYRINGE) ×10 IMPLANT
SYR CONTROL 10ML LL (SYRINGE) ×5 IMPLANT
TAPE MEASURE VINYL STERILE (MISCELLANEOUS) ×5 IMPLANT
TOWEL OR 17X24 6PK STRL BLUE (TOWEL DISPOSABLE) ×10 IMPLANT
TUBE CONNECTING 20'X1/4 (TUBING) ×2
TUBE CONNECTING 20X1/4 (TUBING) ×8 IMPLANT
TUBING SET GRADUATE ASPIR 12FT (MISCELLANEOUS) ×5 IMPLANT
UNDERPAD 30X30 (UNDERPADS AND DIAPERS) ×10 IMPLANT
YANKAUER SUCT BULB TIP NO VENT (SUCTIONS) ×5 IMPLANT

## 2016-05-11 NOTE — Op Note (Signed)
Operative Note   DATE OF OPERATION: 5.30.17  LOCATION: [place] DIVISION: Plastic Surgery  PREOPERATIVE DIAGNOSES:  1. History fibrocystic disease 2. Acquired absence bilateral breasts 3. Family history breast cancer  POSTOPERATIVE DIAGNOSES:  same  PROCEDURE:  1. Revision bilateral breast reconstruction with removal saline implants, placement silicone implants 2. Bilateral mastopexy 3. Liposuction right chest  SURGEON: Irene Limbo MD MBA  ASSISTANT: none  ANESTHESIA:  General.   EBL: 75 ml  COMPLICATIONS: None immediate.   INDICATIONS FOR PROCEDURE:  The patient, Donna Evans, is a 70 y.o. female born on Aug 31, 1946, is here for revision bilateral breast reconstruction. She presents with concern fullness over right upper pole following fall 2015. She has undergone prior subcutaneous mastectomies with implant based reconstruction for a history fibrocystic breasts and family history breast cancer.   FINDINGS: Removed intact bilateral saline textured implants labeled as McGhan 480. By volume displacement approximately 600 ml fill bilateral. With direct suction of implants, approximately 550 ml fill bilateral. Natrelle Inspira Smooth Round Full Profile Cohesive 605 ml implants placed bilateral. REF SCF-605 LEFT SN [id number] RIGHT [id number]. Approximately 25-50 ml fat liposuctioned from superior lateral right chest.  No evidence of capsular contracture, thin soft capsule bilateral.   DESCRIPTION OF PROCEDURE:  The patient's operative site was marked with the patient in the preoperative area including sternal notch, chest midline, anterior axillary line, inframammary folds and area of fullness over upper outer quadrant reconstructed breast. The patient was taken to the operating room. SCDs were placed and IV antibiotics were given. The patient's operative site was prepped and draped in a sterile fashion. A time out was performed and all information was  confirmed to be correct. Incision made in prior right inframammary scar and carried through soft tissue to capsule. Capsule incised and implant saline removed as noted in findings. Capsulotomy performed superiorly and "popcorn" capsulorraphy performed laterally. Implant pocked extended inferior to incision; inframammary fold set at 7 cm from nipple and interrupted 2-0 PDS placed from anterior leaflet of capsule to chest wall to set IMF. Sizer placed in cavity and tailor tacked closed. I then directed attention to left reconstruction and incision made and capsule entered. Implant removed as noted above. Capsulotomy performed superiorly and "popcorn" capsulorraphy performed laterally. The implant pocket extended even greater amount inferior to desired fold on this side and again inframammary fold set at 7 cm from nipple and interrupted 2-0 PDS placed from anterior leaflet of capsule to chest wall to set IMF. Sizer placed and patient brought to upright sitting position. There was asymmetry of NAC position, with left lower than right and both located inferior to most projecting position of implant. Patient tailor tacked for mastopexy. On right this was periareolar and left circumareolar. On left additional skin was also marked from inferior pole at [facility] to create symmetry of nipple to IMF distance. Patient returned to supine position and areas marked by tailor tacking de epithelialized. Pinwheel closure of circumareolar mastopexy completed with 3-0 prolene in deep dermis. Additional 4-0 vicryls placed in dermis prior 4-0 monocryl subcuticular. The pocket was irrigated with saline solution containing Ancef, gentamicin and bacitracin. Hemostasis ensured. Cavity irrigated with Betadine and Natrelle SCF Smooth Round 605 implant placed in cavity. Closure completed in layers with 3-0 vicryl to close capsule and superficial fascia. 4-0 vicryl placed in dermis and 4-0 monocryl subcuticular skin closure.   I then directed  attention to right chest. The periareolar skin was deepithelialized and NAC inset with 4-0  vicryl in dermis and 4-0 monocryl subcuticular skin closure. To address the fullness superior chest, power assisted liposuction performed in this area through stab incision over lateral chest wall. The cavity was irrigated and hemostasis obtained. Betadine irrigation of pocket completed. Implant placed and closure completed in similar fashion. Tissue adhesive, dry dressing, and breast binder applied.   The patient was allowed to wake from anesthesia, extubated and taken to the recovery room in satisfactory condition.   SPECIMENS: right and left mastopexy  DRAINS: none  Irene Limbo, MD [facility] Plastic [facility] Re[facility] Surgery [phone], pin [phone]

## 2016-05-11 NOTE — Discharge Instructions (Signed)

## 2016-05-11 NOTE — Anesthesia Preprocedure Evaluation (Addendum)
Anesthesia Evaluation  Patient identified by MRN, date of birth, ID band Patient awake    Reviewed: Allergy & Precautions, NPO status , Patient's Chart, lab work & pertinent test results  Airway Mallampati: II  TM Distance: >3 FB Neck ROM: Full    Dental no notable dental hx.    Pulmonary neg pulmonary ROS, former smoker,    Pulmonary exam normal breath sounds clear to auscultation       Cardiovascular hypertension, Pt. on medications Normal cardiovascular exam Rhythm:Regular Rate:Normal     Neuro/Psych  Headaches, PSYCHIATRIC DISORDERS Anxiety TIA Neuromuscular disease negative psych ROS   GI/Hepatic Neg liver ROS, GERD  Medicated,  Endo/Other  SLE  Renal/GU negative Renal ROS  negative genitourinary   Musculoskeletal negative musculoskeletal ROS (+) Fibromyalgia -  Abdominal   Peds negative pediatric ROS (+)  Hematology negative hematology ROS (+)   Anesthesia Other Findings   Reproductive/Obstetrics negative OB ROS                            Anesthesia Physical Anesthesia Plan  ASA: III  Anesthesia Plan: General   Post-op Pain Management:    Induction: Intravenous  Airway Management Planned: Oral ETT and LMA  Additional Equipment:   Intra-op Plan:   Post-operative Plan: Extubation in OR  Informed Consent: I have reviewed the patients History and Physical, chart, labs and discussed the procedure including the risks, benefits and alternatives for the proposed anesthesia with the patient or authorized representative who has indicated his/her understanding and acceptance.   Dental advisory given  Plan Discussed with: CRNA and Surgeon  Anesthesia Plan Comments:         Anesthesia Quick Evaluation

## 2016-05-11 NOTE — Anesthesia Postprocedure Evaluation (Signed)
Anesthesia Post Note  Patient: Donna Evans  Procedure(s) Performed: Procedure(s) (LRB): REMOVAL AND REPLACEMENT OF BREAST IMPLANTS  (Bilateral) CAPSULECTOMY CAPSULORRAPHY  (Bilateral) MASTOPEXY BILATERAL  (Bilateral) LIPOSUCTION (Right)  Patient location during evaluation: PACU Anesthesia Type: General Level of consciousness: awake and alert Pain management: pain level controlled Vital Signs Assessment: post-procedure vital signs reviewed and stable Respiratory status: spontaneous breathing, nonlabored ventilation, respiratory function stable and patient connected to nasal cannula oxygen Cardiovascular status: blood pressure returned to baseline and stable Postop Assessment: no signs of nausea or vomiting Anesthetic complications: no    Last Vitals:  Filed Vitals:   05/11/16 1130 05/11/16 1145  BP: 124/61 121/63  Pulse: 80 79  Temp:    Resp: 14 15    Last Pain:  Filed Vitals:   05/11/16 1152  PainSc: 5                  Annisa Mazzarella S

## 2016-05-11 NOTE — Transfer of Care (Signed)
Immediate Anesthesia Transfer of Care Note  Patient: Donna Evans  Procedure(s) Performed: Procedure(s): REMOVAL AND REPLACEMENT OF BREAST IMPLANTS  (Bilateral) CAPSULECTOMY CAPSULORRAPHY  (Bilateral) MASTOPEXY BILATERAL  (Bilateral) LIPOSUCTION (Right)  Patient Location: PACU  Anesthesia Type:General  Level of Consciousness: awake, alert , oriented and patient cooperative  Airway & Oxygen Therapy: Patient Spontanous Breathing and Patient connected to face mask oxygen  Post-op Assessment: Report given to RN and Post -op Vital signs reviewed and stable  Post vital signs: Reviewed and stable  Last Vitals:  Filed Vitals:   05/11/16 1029 05/11/16 1030  BP:  124/64  Pulse: 78 77  Temp:    Resp:  15    Last Pain: There were no vitals filed for this visit.       Complications: No apparent anesthesia complications

## 2016-05-11 NOTE — Anesthesia Procedure Notes (Signed)
Procedure Name: Intubation Date/Time: 05/11/2016 7:27 AM Performed by: Evan Mackie D Pre-anesthesia Checklist: Patient identified, Emergency Drugs available, Suction available and Patient being monitored Patient Re-evaluated:Patient Re-evaluated prior to inductionOxygen Delivery Method: Circle System Utilized Preoxygenation: Pre-oxygenation with 100% oxygen Intubation Type: IV induction Ventilation: Mask ventilation without difficulty Laryngoscope Size: Mac and 3 Grade View: Grade I Tube type: Oral Tube size: 7.0 mm Number of attempts: 1 Airway Equipment and Method: Stylet and Oral airway Placement Confirmation: ETT inserted through vocal cords under direct vision,  positive ETCO2 and breath sounds checked- equal and bilateral Secured at: 21 cm Tube secured with: Tape Dental Injury: Teeth and Oropharynx as per pre-operative assessment

## 2016-05-11 NOTE — Interval H&P Note (Signed)
History and Physical Interval Note:  05/11/2016 6:42 AM  Donna Evans  has presented today for surgery, with the diagnosis of ACQUIRED ABSENCE BREAST HISTORY OF BREAST RECONSTRUCTION   The various methods of treatment have been discussed with the patient and family. After consideration of risks, benefits and other options for treatment, the patient has consented to  Procedure(s): REMOVAL AND REPLACEMENT OF BREAST IMPLANTS  (Bilateral) CAPSULECTOMY CAPSULORRAPHY  (Bilateral)  AND POSSIBLE MASTOPEXY BILATERAL  (Bilateral) as a surgical intervention .  The patient's history has been reviewed, patient examined, no change in status, stable for surgery.  I have reviewed the patient's chart and labs.  Questions were answered to the patient's satisfaction.     Donna Evans

## 2016-05-12 ENCOUNTER — Encounter (HOSPITAL_BASED_OUTPATIENT_CLINIC_OR_DEPARTMENT_OTHER): Payer: Self-pay | Admitting: Plastic Surgery

## 2016-05-20 ENCOUNTER — Ambulatory Visit (INDEPENDENT_AMBULATORY_CARE_PROVIDER_SITE_OTHER): Payer: PPO | Admitting: Family Medicine

## 2016-05-20 ENCOUNTER — Encounter: Payer: Self-pay | Admitting: Family Medicine

## 2016-05-20 VITALS — BP 112/78 | HR 105 | Temp 97.8°F | Resp 16 | Wt 149.2 lb

## 2016-05-20 DIAGNOSIS — F411 Generalized anxiety disorder: Secondary | ICD-10-CM

## 2016-05-20 DIAGNOSIS — G4700 Insomnia, unspecified: Secondary | ICD-10-CM | POA: Diagnosis not present

## 2016-05-20 DIAGNOSIS — G894 Chronic pain syndrome: Secondary | ICD-10-CM

## 2016-05-20 DIAGNOSIS — Z79899 Other long term (current) drug therapy: Secondary | ICD-10-CM | POA: Diagnosis not present

## 2016-05-20 NOTE — Progress Notes (Signed)
BP 112/78 mmHg  Pulse 105  Temp(Src) 97.8 F (36.6 C) (Oral)  Resp 16  Wt 149 lb 3.2 oz (67.677 kg)  SpO2 98%   Subjective:    Patient ID: Donna Evans, female    DOB: 14-Nov-1946, 70 y.o.   MRN: [id number]  HPI: Donna Evans is a 70 y.o. female  Chief Complaint  Patient presents with  . Medication Refill   Her usual provider is not here to see the patient; he is out of the office for an extended period  She is here for routine visit and needs refills She has been taking Xanax at night for sleep; if she feels really anxious during the day, she might take a half of a pill during the day; she has more Xanax than she takes; she has had left-overs before; she very seldom takes a half during the day; they make her sleepy  She worries about getting her pain medicine, butorphenol tartrate; she is allergic to tablets (?) Still has prescription at the pharmacy from DR. Thimmapa from surgery, for 30 of those She had surgery for breast She is using this just occasionally; it depends, like the weather; has had back surgery; has fibromylaigia, has had back surgery; arthritis in her right shoulder; that's all she can have; she says she might use two sprays once a week or once every two weeks; her last dose was last week Her surgery was on a Tuesday and her last dose was Wednesday at noon, last week She is also on Ambien 10 mg at bedtime along with the Xanax at night  Depression screen [facility] 2/9 05/20/2016 02/04/2016 10/08/2015 08/27/2015  Decreased Interest 0 0 0 0  Down, Depressed, Hopeless 0 0 0 0  PHQ - 2 Score 0 0 0 0   Relevant past medical, surgical, family and social history reviewed Past Medical History  Diagnosis Date  . Fibromyalgia   . Peripheral neuropathy ([place])   . Low back pain   . TIA (transient ischemic attack) 11/28/2013  . Transient global amnesia 2011  . Migraine headache   . Foot fracture     Bilateral  . Leg fracture, right   . Anxiety   . GERD (gastroesophageal  reflux disease)   . Lumbosacral neuritis   . Systemic lupus erythematosus ([place])   . Fibromyalgia   . Thyroid disease   . Edema   . Chronic pain   . 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 Breast cancer ([place]) 1990    right breast cancer  . Hypertension    Past Surgical History  Procedure Laterality Date  . Bilateral total mastectomy with axillary lymph node dissection  1990  . Appendectomy  1957  . Cholecystectomy  1979  . Back surgery  1989  . Abdominal hysterectomy  1981  . Liver biopsy  2011  . Augmentation mammaplasty Bilateral 1990    saline submuscular  . Mastectomy subcutaneous Bilateral 1990  . Breast implant exchange Bilateral 05/11/2016    Procedure: REMOVAL AND REPLACEMENT OF BREAST IMPLANTS ;  Surgeon: Irene Limbo, MD;  Location: [place];  Service: Plastics;  Laterality: Bilateral;  . Capsulectomy Bilateral 05/11/2016    Procedure: CAPSULECTOMY CAPSULORRAPHY ;  Surgeon: Irene Limbo, MD;  Location: [place];  Service: Plastics;  Laterality: Bilateral;  . Mastopexy Bilateral 05/11/2016    Procedure: MASTOPEXY BILATERAL ;  Surgeon: Irene Limbo, MD;  Location: [place];  Service: Plastics;  Laterality: Bilateral;  .  Liposuction Right 05/11/2016    Procedure: LIPOSUCTION;  Surgeon: Irene Limbo, MD;  Location: [place];  Service: Plastics;  Laterality: Right;   Family History  Problem Relation Age of Onset  . Atrial fibrillation Mother   . Atrial fibrillation Sister   . Cancer Sister   . Diabetes Sister   . Breast cancer Sister 64  . Diabetes Father   . Cancer Sister   . Diabetes Sister   . Atrial fibrillation Sister   . Kidney disease Maternal Aunt    Social History  Substance Use Topics  . Smoking status: Former Smoker -- 1.00 packs/day for 40 years    Types: Cigarettes    Quit date: 08/28/2005  . Smokeless tobacco: Never Used     Comment: 9 years  . Alcohol Use: 0.0 oz/week      0 Standard drinks or equivalent per week     Comment: rare   Interim medical history since last visit reviewed. Allergies and medications reviewed  Review of Systems Per HPI unless specifically indicated above     Objective:    BP 112/78 mmHg  Pulse 105  Temp(Src) 97.8 F (36.6 C) (Oral)  Resp 16  Wt 149 lb 3.2 oz (67.677 kg)  SpO2 98%  Wt Readings from Last 3 Encounters:  05/20/16 149 lb 3.2 oz (67.677 kg)  05/11/16 149 lb (67.586 kg)  02/04/16 155 lb (70.308 kg)    Physical Exam  Constitutional: She appears well-developed and well-nourished. No distress.  HENT:  Head: Normocephalic and atraumatic.  Eyes: EOM are normal. No scleral icterus.  Neck: No thyromegaly present.  Cardiovascular: Normal rate, regular rhythm and normal heart sounds.   No murmur heard. Pulmonary/Chest: Effort normal and breath sounds normal. No respiratory distress. She has no wheezes.  Abdominal: Soft. Bowel sounds are normal. She exhibits no distension.  Musculoskeletal: Normal range of motion. She exhibits no edema.  Neurological: She is alert. She exhibits normal muscle tone.  Skin: Skin is warm and dry. She is not diaphoretic. No pallor.  Psychiatric: She has a normal mood and affect. Her behavior is normal. Judgment and thought content normal.   Results for orders placed or performed during the hospital encounter of [id number]  Basic metabolic panel  Result Value Ref Range   Sodium 142 135 - 145 mmol/L   Potassium 4.1 3.5 - 5.1 mmol/L   Chloride 103 101 - 111 mmol/L   CO2 31 22 - 32 mmol/L   Glucose, Bld 105 (H) 65 - 99 mg/dL   BUN 12 6 - 20 mg/dL   Creatinine, Ser 1.06 (H) 0.44 - 1.00 mg/dL   Calcium 9.3 8.9 - 10.3 mg/dL   GFR calc non Af Amer 52 (L) >60 mL/min   GFR calc Af Amer >60 >60 mL/min   Anion gap 8 5 - 15      Assessment & Plan:   Problem List Items Addressed This Visit      Other   Insomnia    I explained my concern for the patient, that she should not be on both  Ambien at 10 mg and Xanax at night for sleep; discussed risk of overdose, death when combining controlled substances; dosing for women and individuals 58 years of age and older is only 5 mg anyway, so I will not approve 10 mg Ambien; she was very reluctant to consider changing her regimen; she would not submit to urine collection for UDS testing; I suggest CBT-I for treatment and urge  her to get off of drugs to help her sleep      Generalized anxiety disorder    Explained risk of benzo use in elderly patients; it is not my intention to continue her on benzodiazepine; suggested counseling; she would not give a urine today for UDS; see controlled substance agreement section      Controlled substance agreement signed - Primary    Discussed risk of controlled substances including possible unintentional overdose, especially if mixed with alcohol or other pills; typical speech given including illegal to share, even out of the goodness of patient's heart, always keep in the original bottle, safeguard medicine, do NOT mix with alcohol, other pain pills, "nerve" or anxiety pills, or sleeping pills; I am not obligated to approve of early refill or give new prescription if medicine is lost, stolen, or destroyed even with a police report, etc.; patient agrees with plan; controlled substance contract signed; copy of contract given to patient; patient would not submit to a urine specimen today which will be needed for refills of any controlled substances; she is at risk for overdose on more than one controlled substance and should NOT be taking Xanax and Ambien together      Chronic pain syndrome    Patient also reports using butorphanol for pain; I will not be refilling this for her; I am very concerned about her use of controlled substances and would like to help her get off of these; she did not sound very open to this; no controlled substances will be prescribed by me without UDS (she would not give specimen  today)         Follow up plan: No Follow-up on file.  An after-visit summary was printed and given to the patient at [place].  Please see the patient instructions which may contain other information and recommendations beyond what is mentioned above in the assessment and plan.  Face-to-face time with patient was more than 25 minutes, >50% time spent counseling and coordination of care

## 2016-05-20 NOTE — Patient Instructions (Signed)
STOP the Ambien (zolpidem) immediately Decrease your alprazolam to half of the current dose (just half of a pill at night), and then continue to wean over the coming weeks and then STOP it altogether I recommend you going to cognitive behavioral therapy for insomnia (CBT-I)

## 2016-05-21 ENCOUNTER — Other Ambulatory Visit: Payer: Self-pay | Admitting: Family Medicine

## 2016-05-31 ENCOUNTER — Telehealth: Payer: Self-pay | Admitting: Family Medicine

## 2016-05-31 MED ORDER — ESCITALOPRAM OXALATE 10 MG PO TABS: 10.0000 mg | ORAL_TABLET | Freq: Every day | ORAL | Status: DC

## 2016-05-31 NOTE — Telephone Encounter (Signed)
Refill of lexapro sent

## 2016-05-31 NOTE — Telephone Encounter (Signed)
Pt states she never got her Lexapro called into the pharmacy.Pt has an appt next month. Pt has not been able to come in to give a urine sample due to having surgery. Pt will be happy to give one on her appt.

## 2016-06-12 DIAGNOSIS — G4700 Insomnia, unspecified: Secondary | ICD-10-CM | POA: Insufficient documentation

## 2016-06-12 DIAGNOSIS — Z79899 Other long term (current) drug therapy: Secondary | ICD-10-CM | POA: Insufficient documentation

## 2016-06-12 NOTE — Assessment & Plan Note (Signed)
Explained risk of benzo use in elderly patients; it is not my intention to continue her on benzodiazepine; suggested counseling; she would not give a urine today for UDS; see controlled substance agreement section

## 2016-06-12 NOTE — Assessment & Plan Note (Signed)
Discussed risk of controlled substances including possible unintentional overdose, especially if mixed with alcohol or other pills; typical speech given including illegal to share, even out of the goodness of patient's heart, always keep in the original bottle, safeguard medicine, do NOT mix with alcohol, other pain pills, "nerve" or anxiety pills, or sleeping pills; I am not obligated to approve of early refill or give new prescription if medicine is lost, stolen, or destroyed even with a police report, etc.; patient agrees with plan; controlled substance contract signed; copy of contract given to patient; patient would not submit to a urine specimen today which will be needed for refills of any controlled substances; she is at risk for overdose on more than one controlled substance and should NOT be taking Xanax and Ambien together

## 2016-06-12 NOTE — Assessment & Plan Note (Signed)
I explained my concern for the patient, that she should not be on both Ambien at 10 mg and Xanax at night for sleep; discussed risk of overdose, death when combining controlled substances; dosing for women and individuals 70 years of age and older is only 5 mg anyway, so I will not approve 10 mg Ambien; she was very reluctant to consider changing her regimen; she would not submit to urine collection for UDS testing; I suggest CBT-I for treatment and urge her to get off of drugs to help her sleep

## 2016-06-12 NOTE — Assessment & Plan Note (Signed)
Patient also reports using butorphanol for pain; I will not be refilling this for her; I am very concerned about her use of controlled substances and would like to help her get off of these; she did not sound very open to this; no controlled substances will be prescribed by me without UDS (she would not give specimen today)

## 2016-06-21 ENCOUNTER — Ambulatory Visit: Payer: PPO | Admitting: Family Medicine

## 2016-06-28 ENCOUNTER — Other Ambulatory Visit: Payer: Self-pay | Admitting: Family Medicine

## 2016-06-30 ENCOUNTER — Ambulatory Visit: Payer: PPO | Admitting: Family Medicine

## 2016-07-02 ENCOUNTER — Encounter: Payer: Self-pay | Admitting: Family Medicine

## 2016-07-02 ENCOUNTER — Ambulatory Visit (INDEPENDENT_AMBULATORY_CARE_PROVIDER_SITE_OTHER): Payer: PPO | Admitting: Family Medicine

## 2016-07-02 VITALS — BP 122/84 | HR 86 | Temp 97.7°F | Resp 18 | Ht 66.0 in | Wt 154.4 lb

## 2016-07-02 DIAGNOSIS — M797 Fibromyalgia: Secondary | ICD-10-CM | POA: Diagnosis not present

## 2016-07-02 DIAGNOSIS — G894 Chronic pain syndrome: Secondary | ICD-10-CM | POA: Diagnosis not present

## 2016-07-02 DIAGNOSIS — G4700 Insomnia, unspecified: Secondary | ICD-10-CM

## 2016-07-02 DIAGNOSIS — R2681 Unsteadiness on feet: Secondary | ICD-10-CM | POA: Diagnosis not present

## 2016-07-02 DIAGNOSIS — I10 Essential (primary) hypertension: Secondary | ICD-10-CM

## 2016-07-02 DIAGNOSIS — G8929 Other chronic pain: Secondary | ICD-10-CM | POA: Diagnosis not present

## 2016-07-02 DIAGNOSIS — F419 Anxiety disorder, unspecified: Secondary | ICD-10-CM | POA: Diagnosis not present

## 2016-07-02 DIAGNOSIS — F411 Generalized anxiety disorder: Secondary | ICD-10-CM

## 2016-07-02 MED ORDER — HYDROCHLOROTHIAZIDE 12.5 MG PO TABS: 12.5000 mg | ORAL_TABLET | Freq: Two times a day (BID) | ORAL | Status: DC

## 2016-07-02 MED ORDER — GABAPENTIN 100 MG PO CAPS: 100.0000 mg | ORAL_CAPSULE | Freq: Three times a day (TID) | ORAL | Status: DC

## 2016-07-02 MED ORDER — ALPRAZOLAM ER 0.5 MG PO TB24: 0.5000 mg | ORAL_TABLET | Freq: Every day | ORAL | Status: DC

## 2016-07-02 MED ORDER — QUETIAPINE FUMARATE 25 MG PO TABS: 25.0000 mg | ORAL_TABLET | Freq: Every day | ORAL | Status: DC

## 2016-07-02 NOTE — Patient Instructions (Signed)
Gabapentin start at 100 mg at night and go up by one capsule every other day to a goal of 3 capsules three times daily

## 2016-07-02 NOTE — Progress Notes (Signed)
Name: Donna Evans   MRN: [id number]    DOB: April 08, 1946   Date:07/02/2016       Progress Note  Subjective  Chief Complaint  Chief Complaint  Patient presents with  . Anxiety  . Insomnia    HPI  HTN: she states her bp is better controlled in am's below 140's/80's, but can go up at night. She states she gets nervous at night - her husband has sundowning and she misses her son that died 20 years ago ( her only child ). She is taking HCTZ twice daily . She does not want to change medication at this time.    Insomnia: taking Ambien and is working well for her, she denies side effects of medication, she was seen by Dr. Sanda Klein and was advised to not take Ambien. We will switch her to Seroquel qpm, discussed possible side effects  GAD: she taking Alprazolam 1 mg three times daily prn, she was started on Lexapro in Feb and is feeling better, usually taking Alprazolam 1 mg twice daily, but explained that since Dr. Rutherford Nail is not back from medical leave yet, I don't feel comfortable prescribing that amount of medication but explained that we can try changing to Alprazolam XR 0.5 mg and monitor  Chronic pain: she has FMS, history of back surgery, peripheral neuropathy and states intolerant Gabapentin - caused sedation, however no longer working and would like to try it out. She uses Stadol but explained that I don't feel comfortable prescribed this medication.   Gait Stability: she feels like because of neuropathy she has lack of perception at times, she states it is easy to fall and would like to try PT  Patient Active Problem List   Diagnosis Date Noted  . Controlled substance agreement signed 06/12/2016  . Insomnia 06/12/2016  . Hypertension, benign 02/04/2016  . Easy bruisability 11/13/2015  . Osteoporosis 11/13/2015  . Fibromyalgia syndrome 11/13/2015  . Generalized anxiety disorder 11/13/2015  . Chronic pain syndrome 11/13/2015  . Detrusor muscle hypertonia 09/11/2015  . Adrenal adenoma  09/02/2015  . Bladder infection, chronic 09/02/2015  . Incomplete bladder emptying 09/02/2015  . Renal colic 09/47/0962  . Frank hematuria 07/28/2015  . History of surgical procedure 06/18/2015  . Absence of breast 11/20/2014  . TIA (transient ischemic attack) 11/28/2013  . Hemiplegia of dominant side ([place]) 11/20/2013    Past Surgical History  Procedure Laterality Date  . Bilateral total mastectomy with axillary lymph node dissection  1990  . Appendectomy  1957  . Cholecystectomy  1979  . Back surgery  1989  . Abdominal hysterectomy  1981  . Liver biopsy  2011  . Augmentation mammaplasty Bilateral 1990    saline submuscular  . Mastectomy subcutaneous Bilateral 1990  . Breast implant exchange Bilateral 05/11/2016    Procedure: REMOVAL AND REPLACEMENT OF BREAST IMPLANTS ;  Surgeon: Irene Limbo, MD;  Location: [place];  Service: Plastics;  Laterality: Bilateral;  . Capsulectomy Bilateral 05/11/2016    Procedure: CAPSULECTOMY CAPSULORRAPHY ;  Surgeon: Irene Limbo, MD;  Location: [place];  Service: Plastics;  Laterality: Bilateral;  . Mastopexy Bilateral 05/11/2016    Procedure: MASTOPEXY BILATERAL ;  Surgeon: Irene Limbo, MD;  Location: [place];  Service: Plastics;  Laterality: Bilateral;  . Liposuction Right 05/11/2016    Procedure: LIPOSUCTION;  Surgeon: Irene Limbo, MD;  Location: [place];  Service: Plastics;  Laterality: Right;    Family History  Problem Relation Age of  Onset  . Atrial fibrillation Mother   . Atrial fibrillation Sister   . Cancer Sister   . Diabetes Sister   . Breast cancer Sister 13  . Diabetes Father   . Cancer Sister   . Diabetes Sister   . Atrial fibrillation Sister   . Kidney disease Maternal Aunt     Social History   Social History  . Marital Status: Married    Spouse Name: N/A  . Number of Children: 1  . Years of Education: college3   Occupational  History  . Retired    Social History Main Topics  . Smoking status: Former Smoker -- 1.00 packs/day for 40 years    Types: Cigarettes    Quit date: 08/28/2005  . Smokeless tobacco: Never Used     Comment: 9 years  . Alcohol Use: 0.0 oz/week    0 Standard drinks or equivalent per week     Comment: rare  . Drug Use: No  . Sexual Activity: Not on file   Other Topics Concern  . Not on file   Social History Narrative     Current outpatient prescriptions:  .  ALPRAZolam (ALPRAZOLAM XR) 0.5 MG 24 hr tablet, Take 1 tablet (0.5 mg total) by mouth daily., Disp: 30 tablet, Rfl: 0 .  aspirin (GOODSENSE ASPIRIN) 81 MG chewable tablet, Chew by mouth., Disp: , Rfl:  .  butorphanol (STADOL) 10 MG/ML nasal spray, TAKE 1 SPRAY IN EACH NOSTIL EVERY 4 HOURS AS NEEDED FOR SEVERE PAIN, Disp: 15 mL, Rfl: 0 .  escitalopram (LEXAPRO) 10 MG tablet, TAKE 1 TABLET BY MOUTH EVERY DAY, Disp: 30 tablet, Rfl: 2 .  hydrochlorothiazide (HYDRODIURIL) 12.5 MG tablet, Take 1 tablet (12.5 mg total) by mouth 2 (two) times daily., Disp: 60 tablet, Rfl: 5 .  QUEtiapine (SEROQUEL) 25 MG tablet, Take 1 tablet (25 mg total) by mouth at bedtime., Disp: 30 tablet, Rfl: 0  Allergies  Allergen Reactions  . Ciprofloxacin Hives  . Fluconazole Hives  . Codeine Other (See Comments)    Reaction:  Unknown   . Demerol [Meperidine] Other (See Comments)    Reaction:  Unknown    . Duloxetine Hcl Other (See Comments)    Reaction:  Unknown   . Flagyl [Metronidazole] Other (See Comments)    Reaction:  Unknown   . Hydrocodone-Acetaminophen Hives  . Influenza Vaccines Swelling  . Morphine And Related Other (See Comments)    Reaction:  Unknown   . Amoxicillin-Pot Clavulanate Nausea And Vomiting  . Latex Rash  . Sulfamethoxazole-Trimethoprim Rash  . Valacyclovir Rash     ROS  Constitutional: Negative for fever or weight change.  Respiratory: Negative for cough and shortness of breath.   Cardiovascular: Negative for chest  pain or palpitations.  Gastrointestinal: Negative for abdominal pain, no bowel changes.  Musculoskeletal: Positive  for gait problem or joint swelling.  Skin: Negative for rash.  Neurological: Negative for dizziness or headache.  No other specific complaints in a complete review of systems (except as listed in HPI above).  Objective  Filed Vitals:   07/02/16 1452  BP: 122/84  Pulse: 86  Temp: 97.7 F (36.5 C)  Resp: 18  Height: 5' 6"  (1.676 m)  Weight: 154 lb 6 oz (70.024 kg)  SpO2: 99%    Body mass index is 24.93 kg/(m^2).  Physical Exam  Constitutional: Patient appears well-developed and well-nourished. No distress.  HEENT: head atraumatic, normocephalic, pupils equal and reactive to light, neck supple, throat within normal limits  Cardiovascular: Normal rate, regular rhythm and normal heart sounds.  No murmur heard. No BLE edema. Pulmonary/Chest: Effort normal and breath sounds normal. No respiratory distress. Abdominal: Soft.  There is no tenderness. Psychiatric: Patient has a normal mood and affect. behavior is normal. Judgment and thought content normal. Muscular Skeletal: trigger points positive throughout.   Recent Results (from the past 2160 hour(s))  Basic metabolic panel     Status: Abnormal   Collection Time: 05/07/16 12:13 PM  Result Value Ref Range   Sodium 142 135 - 145 mmol/L   Potassium 4.1 3.5 - 5.1 mmol/L   Chloride 103 101 - 111 mmol/L   CO2 31 22 - 32 mmol/L   Glucose, Bld 105 (H) 65 - 99 mg/dL   BUN 12 6 - 20 mg/dL   Creatinine, Ser 1.06 (H) 0.44 - 1.00 mg/dL   Calcium 9.3 8.9 - 10.3 mg/dL   GFR calc non Af Amer 52 (L) >60 mL/min   GFR calc Af Amer >60 >60 mL/min    Comment: (NOTE) The eGFR has been calculated using the CKD EPI equation. This calculation has not been validated in all clinical situations. eGFR's persistently <60 mL/min signify possible Chronic Kidney Disease.    Anion gap 8 5 - 15     PHQ2/9: Depression screen [facility] 2/9  07/02/2016 05/20/2016 02/04/2016 10/08/2015 08/27/2015  Decreased Interest 0 0 0 0 0  Down, Depressed, Hopeless 0 0 0 0 0  PHQ - 2 Score 0 0 0 0 0     Fall Risk: Fall Risk  07/02/2016 05/20/2016 02/04/2016 10/08/2015 08/27/2015  Falls in the past year? No No No Yes No  Number falls in past yr: - - - 2 or more 1  Injury with Fall? - - - Yes Yes     Functional Status Survey: Is the patient deaf or have difficulty hearing?: No Does the patient have difficulty seeing, even when wearing glasses/contacts?: Yes Does the patient have difficulty concentrating, remembering, or making decisions?: No Does the patient have difficulty walking or climbing stairs?: No Does the patient have difficulty dressing or bathing?: No Does the patient have difficulty doing errands alone such as visiting a doctor's office or shopping?: No    Assessment & Plan  1. Hypertension, benign  - hydrochlorothiazide (HYDRODIURIL) 12.5 MG tablet; Take 1 tablet (12.5 mg total) by mouth 2 (two) times daily.  Dispense: 60 tablet; Refill: 5  2. Generalized anxiety disorder  - ALPRAZolam (ALPRAZOLAM XR) 0.5 MG 24 hr tablet; Take 1 tablet (0.5 mg total) by mouth daily.  Dispense: 30 tablet; Refill: 0  3. Chronic pain syndrome  - Ambulatory referral to Pain Clinic - gabapentin (NEURONTIN) 100 MG capsule; Take 1-3 capsules (100-300 mg total) by mouth 3 (three) times daily.  Dispense: 100 capsule; Refill: 0 Needs to titrate up slowly by 100 mg per day. Also explained the risk of sedation and the need to titrate down slowly   4. Insomnia  - QUEtiapine (SEROQUEL) 25 MG tablet; Take 1 tablet (25 mg total) by mouth at bedtime.  Dispense: 30 tablet; Refill: 0  5. Gait instability  - Ambulatory referral to Physical Therapy  6. Fibromyalgia  - gabapentin (NEURONTIN) 100 MG capsule; Take 1-3 capsules (100-300 mg total) by mouth 3 (three) times daily.  Dispense: 100 capsule; Refill: 0

## 2016-07-09 ENCOUNTER — Telehealth: Payer: Self-pay | Admitting: Family Medicine

## 2016-07-09 NOTE — Telephone Encounter (Signed)
Informed patient Dr. Ancil Boozer does not feel comfortable prescription this pain medication and if she is unable to drive to [facility] that the closest pain clinic was [facility]. Patient asked what she could do in the mean time I told her the CPS could see her the soonest and [facility] was booked out weeks. We are unable to fill pain medications in the mean time and that we are sorry about this.

## 2016-07-12 ENCOUNTER — Encounter: Payer: Self-pay | Admitting: Family Medicine

## 2016-08-10 ENCOUNTER — Ambulatory Visit (INDEPENDENT_AMBULATORY_CARE_PROVIDER_SITE_OTHER): Payer: PPO | Admitting: Family Medicine

## 2016-08-10 ENCOUNTER — Encounter: Payer: Self-pay | Admitting: Family Medicine

## 2016-08-10 VITALS — BP 158/82 | HR 72 | Temp 98.0°F | Resp 14 | Ht 66.5 in | Wt 156.5 lb

## 2016-08-10 DIAGNOSIS — M797 Fibromyalgia: Secondary | ICD-10-CM | POA: Diagnosis not present

## 2016-08-10 DIAGNOSIS — J069 Acute upper respiratory infection, unspecified: Secondary | ICD-10-CM | POA: Diagnosis not present

## 2016-08-10 DIAGNOSIS — F411 Generalized anxiety disorder: Secondary | ICD-10-CM | POA: Diagnosis not present

## 2016-08-10 DIAGNOSIS — G43909 Migraine, unspecified, not intractable, without status migrainosus: Secondary | ICD-10-CM | POA: Insufficient documentation

## 2016-08-10 DIAGNOSIS — J40 Bronchitis, not specified as acute or chronic: Secondary | ICD-10-CM

## 2016-08-10 DIAGNOSIS — G4700 Insomnia, unspecified: Secondary | ICD-10-CM

## 2016-08-10 DIAGNOSIS — G43009 Migraine without aura, not intractable, without status migrainosus: Secondary | ICD-10-CM

## 2016-08-10 DIAGNOSIS — G894 Chronic pain syndrome: Secondary | ICD-10-CM

## 2016-08-10 MED ORDER — PREDNISONE 10 MG PO TABS: 10.0000 mg | ORAL_TABLET | Freq: Two times a day (BID) | ORAL | 0 refills | Status: DC

## 2016-08-10 MED ORDER — RIZATRIPTAN BENZOATE 10 MG PO TABS: 10.0000 mg | ORAL_TABLET | ORAL | 0 refills | Status: DC | PRN

## 2016-08-10 MED ORDER — TIZANIDINE HCL 4 MG PO TABS: 4.0000 mg | ORAL_TABLET | Freq: Every day | ORAL | 2 refills | Status: DC

## 2016-08-10 MED ORDER — ESCITALOPRAM OXALATE 20 MG PO TABS: 20.0000 mg | ORAL_TABLET | Freq: Every day | ORAL | 2 refills | Status: DC

## 2016-08-10 NOTE — Progress Notes (Signed)
Name: Donna Evans   MRN: [id number]    DOB: 08-Jul-1946   Date:08/10/2016       Progress Note  Subjective  Chief Complaint  Chief Complaint  Patient presents with  . Follow-up    1 months  . Nasal Congestion    chest congestion, cough    HPI  Insomnia: taking Ambien and is working well for her, she denies side effects of medication, she was seen by Dr. Sanda Klein and was advised to not take Ambien. I gave her a rx of Seroquel, but she read about it and she got scared to take it. She will try taking Gabapenin first at night only and if no improvement on sleep she can try taking Seroquel   GAD: she taking Alprazolam 1 mg three times daily prn, she was started on Lexapro in Feb and is feeling better, usually taking Alprazolam 1 mg twice daily, but explained that since Dr. Rutherford Nail is not back from medical leave yet, I don't feel comfortable prescribing that amount of medication but explained that we changed  to Alprazolam XR 0.5 mg on her last visit but she did not like it, she states she was afraid to drive while taking it and she still works. She did not take it. We will increase dose of Lexapro to 20 mg and monitor it.   Chronic pain: she has FMS, history of back surgery, peripheral neuropathy and states intolerant Gabapentin - caused sedation, however no longer working and would like to try it out. She uses Stadol but explained that I don't feel comfortable prescribed this medication.  We made a referral to pain clinic, but it was in [place] and she could not go. She states she is okay if she can take Flexeril qhs prn. Explained we can try Tizanidine.   Migraine headaches: long history of migraines, pain is described as throbbing, sometimes like a bowling ball inside her head, associated with nausea, photophobia. She has been taking Stadol for many years, she is not sure if she ever tried Triptans, discussed possible side effects and we will try it. I also explained to her that Gabapentin can  help with migraine frequency. She states her migraine episodes are about 3 times monthly or less.   URI/Bronchitis: she is not smoker, her husband had URI last week. She states symptoms started with rhinorrhea, post-nasal drainage over the past 5 days, followed by chest congestion, wheezing and a dry cough. She also has ear pressure, she is using nasal saline but still has symptoms, advised otc Flonase also  Patient Active Problem List   Diagnosis Date Noted  . Migraine headache 08/10/2016  . Controlled substance agreement signed 06/12/2016  . Insomnia 06/12/2016  . Hypertension, benign 02/04/2016  . Easy bruisability 11/13/2015  . Osteoporosis 11/13/2015  . Fibromyalgia syndrome 11/13/2015  . Generalized anxiety disorder 11/13/2015  . Chronic pain syndrome 11/13/2015  . Detrusor muscle hypertonia 09/11/2015  . Adrenal adenoma 09/02/2015  . Bladder infection, chronic 09/02/2015  . Incomplete bladder emptying 09/02/2015  . Renal colic [id number]  . Frank hematuria 07/28/2015  . History of surgical procedure 06/18/2015  . Absence of breast 11/20/2014  . TIA (transient ischemic attack) 11/28/2013  . Hemiplegia of dominant side ([place]) 11/20/2013    Past Surgical History:  Procedure Laterality Date  . ABDOMINAL HYSTERECTOMY  1981  . APPENDECTOMY  1957  . AUGMENTATION MAMMAPLASTY Bilateral 1990   saline submuscular  . BACK SURGERY  1989  . BILATERAL TOTAL MASTECTOMY WITH AXILLARY  LYMPH NODE DISSECTION  1990  . BREAST IMPLANT EXCHANGE Bilateral 05/11/2016   Procedure: REMOVAL AND REPLACEMENT OF BREAST IMPLANTS ;  Surgeon: Irene Limbo, MD;  Location: [place];  Service: Plastics;  Laterality: Bilateral;  . CAPSULECTOMY Bilateral 05/11/2016   Procedure: CAPSULECTOMY CAPSULORRAPHY ;  Surgeon: Irene Limbo, MD;  Location: [place];  Service: Plastics;  Laterality: Bilateral;  . CHOLECYSTECTOMY  1979  . LIPOSUCTION Right 05/11/2016   Procedure:  LIPOSUCTION;  Surgeon: Irene Limbo, MD;  Location: [place];  Service: Plastics;  Laterality: Right;  . LIVER BIOPSY  2011  . MASTECTOMY SUBCUTANEOUS Bilateral 1990  . MASTOPEXY Bilateral 05/11/2016   Procedure: MASTOPEXY BILATERAL ;  Surgeon: Irene Limbo, MD;  Location: [place];  Service: Plastics;  Laterality: Bilateral;    Family History  Problem Relation Age of Onset  . Atrial fibrillation Mother   . Atrial fibrillation Sister   . Cancer Sister   . Diabetes Sister   . Breast cancer Sister 55  . Diabetes Father   . Cancer Sister   . Diabetes Sister   . Atrial fibrillation Sister   . Kidney disease Maternal Aunt     Social History   Social History  . Marital status: Married    Spouse name: N/A  . Number of children: 1  . Years of education: college3   Occupational History  . Retired    Social History Main Topics  . Smoking status: Former Smoker    Packs/day: 1.00    Years: 40.00    Types: Cigarettes    Quit date: 08/28/2005  . Smokeless tobacco: Never Used     Comment: 9 years  . Alcohol use 0.0 oz/week     Comment: rare  . Drug use: No  . Sexual activity: Not on file   Other Topics Concern  . Not on file   Social History Narrative  . No narrative on file     Current Outpatient Prescriptions:  .  ketoconazole (NIZORAL) 2 % shampoo, Apply topically., Disp: , Rfl:  .  aspirin (GOODSENSE ASPIRIN) 81 MG chewable tablet, Chew by mouth., Disp: , Rfl:  .  escitalopram (LEXAPRO) 20 MG tablet, Take 1 tablet (20 mg total) by mouth daily., Disp: 30 tablet, Rfl: 2 .  gabapentin (NEURONTIN) 100 MG capsule, Take 1-3 capsules (100-300 mg total) by mouth 3 (three) times daily., Disp: 100 capsule, Rfl: 0 .  hydrochlorothiazide (HYDRODIURIL) 12.5 MG tablet, Take 1 tablet (12.5 mg total) by mouth 2 (two) times daily., Disp: 60 tablet, Rfl: 5 .  predniSONE (DELTASONE) 10 MG tablet, Take 1 tablet (10 mg total) by mouth 2 (two) times  daily with a meal., Disp: 10 tablet, Rfl: 0 .  rizatriptan (MAXALT) 10 MG tablet, Take 1 tablet (10 mg total) by mouth as needed for migraine. May repeat in 2 hours if needed, Disp: 10 tablet, Rfl: 0  Allergies  Allergen Reactions  . Ciprofloxacin Hives  . Fluconazole Hives  . Codeine Other (See Comments)    Reaction:  Unknown   . Demerol [Meperidine] Other (See Comments)    Reaction:  Unknown    . Duloxetine Hcl Other (See Comments)    Reaction:  Unknown   . Flagyl [Metronidazole] Other (See Comments)    Reaction:  Unknown   . Hydrocodone-Acetaminophen Hives  . Influenza Vaccines Swelling  . Morphine And Related Other (See Comments)    Reaction:  Unknown   . Amoxicillin-Pot Clavulanate Nausea  And Vomiting  . Latex Rash  . Sulfamethoxazole-Trimethoprim Rash  . Valacyclovir Rash     ROS  Ten systems reviewed and is negative except as mentioned in HPI   Objective  Vitals:   08/10/16 1337  BP: (!) 158/78  Pulse: 72  Resp: 14  Temp: 98 F (36.7 C)  TempSrc: Oral  SpO2: 96%  Weight: 156 lb 8 oz (71 kg)  Height: 5' 6.5" (1.689 m)    Body mass index is 24.88 kg/m.  Physical Exam  Constitutional: Patient appears well-developed and well-nourished.  No distress.  HEENT: head atraumatic, normocephalic, pupils equal and reactive to light, ears normal TM bilateral, tenderness percussion of sinus, neck supple, throat within normal limits, torus palatinus Cardiovascular: Normal rate, regular rhythm and normal heart sounds.  No murmur heard. No BLE edema. Pulmonary/Chest: Effort normal , mild scattered rhonchi. No respiratory distress. Abdominal: Soft.  There is no tenderness. Psychiatric: Patient has a normal mood and affect. behavior is normal. Judgment and thought content normal.\  PHQ2/9: Depression screen [facility] 2/9 07/02/2016 05/20/2016 02/04/2016 10/08/2015 08/27/2015  Decreased Interest 0 0 0 0 0  Down, Depressed, Hopeless 0 0 0 0 0  PHQ - 2 Score 0 0 0 0 0   GAD 7 :  Generalized Anxiety Score 08/10/2016  Nervous, Anxious, on Edge 2  Control/stop worrying 3  Worry too much - different things 3  Trouble relaxing 3  Restless 0  Easily annoyed or irritable 1  Afraid - awful might happen 0  Total GAD 7 Score 12  Anxiety Difficulty Somewhat difficult     Fall Risk: Fall Risk  07/02/2016 05/20/2016 02/04/2016 10/08/2015 08/27/2015  Falls in the past year? No No No Yes No  Number falls in past yr: - - - 2 or more 1  Injury with Fall? - - - Yes Yes   Assessment & Plan  1. Generalized anxiety disorder  - escitalopram (LEXAPRO) 20 MG tablet; Take 1 tablet (20 mg total) by mouth daily.  Dispense: 30 tablet; Refill: 2  2. Chronic pain syndrome  She needs to try Gabapentin   3. Migraine without aura and without status migrainosus, not intractable  - rizatriptan (MAXALT) 10 MG tablet; Take 1 tablet (10 mg total) by mouth as needed for migraine. May repeat in 2 hours if needed  Dispense: 10 tablet; Refill: 0  4. Upper respiratory infection  May try otc mucinex if tolerated  5. Insomnia  She will try gabapentin and if no improvement she will try Seroquel   6. Bronchitis  Discussed Breo, but she prefers prednisone  - predniSONE (DELTASONE) 10 MG tablet; Take 1 tablet (10 mg total) by mouth 2 (two) times daily with a meal.  Dispense: 10 tablet; Refill: 0  7. Fibromyalgia  - tiZANidine (ZANAFLEX) 4 MG tablet; Take 1 tablet (4 mg total) by mouth at bedtime.  Dispense: 30 tablet; Refill: 2

## 2016-09-14 DIAGNOSIS — R32 Unspecified urinary incontinence: Secondary | ICD-10-CM | POA: Diagnosis not present

## 2016-09-14 DIAGNOSIS — G4709 Other insomnia: Secondary | ICD-10-CM | POA: Diagnosis not present

## 2016-09-14 DIAGNOSIS — M810 Age-related osteoporosis without current pathological fracture: Secondary | ICD-10-CM | POA: Diagnosis not present

## 2016-09-14 DIAGNOSIS — M797 Fibromyalgia: Secondary | ICD-10-CM | POA: Diagnosis not present

## 2016-09-14 DIAGNOSIS — F411 Generalized anxiety disorder: Secondary | ICD-10-CM | POA: Diagnosis not present

## 2016-09-14 DIAGNOSIS — S46912S Strain of unspecified muscle, fascia and tendon at shoulder and upper arm level, left arm, sequela: Secondary | ICD-10-CM | POA: Diagnosis not present

## 2016-09-14 DIAGNOSIS — G8929 Other chronic pain: Secondary | ICD-10-CM | POA: Diagnosis not present

## 2016-09-30 ENCOUNTER — Observation Stay (HOSPITAL_COMMUNITY)
Admission: EM | Admit: 2016-09-30 | Discharge: 2016-10-01 | Disposition: A | Discharge status: Home / Self Care | Payer: PPO | Attending: Family Medicine | Admitting: Family Medicine

## 2016-09-30 ENCOUNTER — Emergency Department (HOSPITAL_COMMUNITY): Payer: PPO

## 2016-09-30 ENCOUNTER — Encounter (HOSPITAL_COMMUNITY): Payer: Self-pay | Admitting: Emergency Medicine

## 2016-09-30 DIAGNOSIS — G629 Polyneuropathy, unspecified: Secondary | ICD-10-CM | POA: Insufficient documentation

## 2016-09-30 DIAGNOSIS — Z853 Personal history of malignant neoplasm of breast: Secondary | ICD-10-CM | POA: Diagnosis not present

## 2016-09-30 DIAGNOSIS — Z8673 Personal history of transient ischemic attack (TIA), and cerebral infarction without residual deficits: Secondary | ICD-10-CM | POA: Diagnosis not present

## 2016-09-30 DIAGNOSIS — Z87891 Personal history of nicotine dependence: Secondary | ICD-10-CM | POA: Insufficient documentation

## 2016-09-30 DIAGNOSIS — Z885 Allergy status to narcotic agent status: Secondary | ICD-10-CM | POA: Insufficient documentation

## 2016-09-30 DIAGNOSIS — Z9013 Acquired absence of bilateral breasts and nipples: Secondary | ICD-10-CM | POA: Insufficient documentation

## 2016-09-30 DIAGNOSIS — Z880 Allergy status to penicillin: Secondary | ICD-10-CM | POA: Diagnosis not present

## 2016-09-30 DIAGNOSIS — R55 Syncope and collapse: Principal | ICD-10-CM | POA: Diagnosis present

## 2016-09-30 DIAGNOSIS — I10 Essential (primary) hypertension: Secondary | ICD-10-CM | POA: Diagnosis not present

## 2016-09-30 DIAGNOSIS — R51 Headache: Secondary | ICD-10-CM | POA: Diagnosis not present

## 2016-09-30 DIAGNOSIS — I4510 Unspecified right bundle-branch block: Secondary | ICD-10-CM | POA: Insufficient documentation

## 2016-09-30 DIAGNOSIS — F419 Anxiety disorder, unspecified: Secondary | ICD-10-CM | POA: Insufficient documentation

## 2016-09-30 DIAGNOSIS — Z888 Allergy status to other drugs, medicaments and biological substances status: Secondary | ICD-10-CM | POA: Insufficient documentation

## 2016-09-30 DIAGNOSIS — R29810 Facial weakness: Secondary | ICD-10-CM | POA: Diagnosis not present

## 2016-09-30 DIAGNOSIS — K219 Gastro-esophageal reflux disease without esophagitis: Secondary | ICD-10-CM | POA: Diagnosis not present

## 2016-09-30 DIAGNOSIS — G459 Transient cerebral ischemic attack, unspecified: Secondary | ICD-10-CM | POA: Diagnosis not present

## 2016-09-30 DIAGNOSIS — G894 Chronic pain syndrome: Secondary | ICD-10-CM | POA: Diagnosis not present

## 2016-09-30 DIAGNOSIS — M329 Systemic lupus erythematosus, unspecified: Secondary | ICD-10-CM | POA: Insufficient documentation

## 2016-09-30 DIAGNOSIS — E039 Hypothyroidism, unspecified: Secondary | ICD-10-CM | POA: Insufficient documentation

## 2016-09-30 DIAGNOSIS — Z887 Allergy status to serum and vaccine status: Secondary | ICD-10-CM | POA: Diagnosis not present

## 2016-09-30 DIAGNOSIS — M797 Fibromyalgia: Secondary | ICD-10-CM | POA: Diagnosis not present

## 2016-09-30 DIAGNOSIS — R200 Anesthesia of skin: Secondary | ICD-10-CM | POA: Insufficient documentation

## 2016-09-30 DIAGNOSIS — Z7982 Long term (current) use of aspirin: Secondary | ICD-10-CM | POA: Diagnosis not present

## 2016-09-30 DIAGNOSIS — N179 Acute kidney failure, unspecified: Secondary | ICD-10-CM | POA: Diagnosis not present

## 2016-09-30 DIAGNOSIS — I679 Cerebrovascular disease, unspecified: Secondary | ICD-10-CM | POA: Diagnosis not present

## 2016-09-30 DIAGNOSIS — Z66 Do not resuscitate: Secondary | ICD-10-CM | POA: Diagnosis not present

## 2016-09-30 DIAGNOSIS — Z833 Family history of diabetes mellitus: Secondary | ICD-10-CM | POA: Insufficient documentation

## 2016-09-30 DIAGNOSIS — R531 Weakness: Secondary | ICD-10-CM | POA: Insufficient documentation

## 2016-09-30 DIAGNOSIS — Z91040 Latex allergy status: Secondary | ICD-10-CM | POA: Insufficient documentation

## 2016-09-30 DIAGNOSIS — Z803 Family history of malignant neoplasm of breast: Secondary | ICD-10-CM | POA: Insufficient documentation

## 2016-09-30 DIAGNOSIS — Z881 Allergy status to other antibiotic agents status: Secondary | ICD-10-CM | POA: Diagnosis not present

## 2016-09-30 LAB — DIFFERENTIAL
Basophils Absolute: 0.1 10*3/uL (ref 0.0–0.1)
Basophils Relative: 1 %
Eosinophils Absolute: 0.2 10*3/uL (ref 0.0–0.7)
Eosinophils Relative: 1 %
LYMPHS PCT: 17 %
Lymphs Abs: 2.1 10*3/uL (ref 0.7–4.0)
MONO ABS: 0.6 10*3/uL (ref 0.1–1.0)
MONOS PCT: 5 %
NEUTROS ABS: 9.8 10*3/uL — AB (ref 1.7–7.7)
Neutrophils Relative %: 76 %

## 2016-09-30 LAB — COMPREHENSIVE METABOLIC PANEL
ALK PHOS: 187 U/L — AB (ref 38–126)
ALT: 18 U/L (ref 14–54)
AST: 28 U/L (ref 15–41)
Albumin: 4.1 g/dL (ref 3.5–5.0)
Anion gap: 7 (ref 5–15)
BILIRUBIN TOTAL: 0.4 mg/dL (ref 0.3–1.2)
BUN: 25 mg/dL — ABNORMAL HIGH (ref 6–20)
CALCIUM: 9.4 mg/dL (ref 8.9–10.3)
CO2: 26 mmol/L (ref 22–32)
CREATININE: 1.43 mg/dL — AB (ref 0.44–1.00)
Chloride: 104 mmol/L (ref 101–111)
GFR, EST AFRICAN AMERICAN: 42 mL/min — AB (ref >60)
GFR, EST NON AFRICAN AMERICAN: 36 mL/min — AB (ref >60)
Glucose, Bld: 109 mg/dL — ABNORMAL HIGH (ref 65–99)
Potassium: 4.1 mmol/L (ref 3.5–5.1)
Sodium: 137 mmol/L (ref 135–145)
Total Protein: 7.3 g/dL (ref 6.5–8.1)

## 2016-09-30 LAB — I-STAT CHEM 8, ED
BUN: 28 mg/dL — AB (ref 6–20)
CREATININE: 1.5 mg/dL — AB (ref 0.44–1.00)
Calcium, Ion: 1.05 mmol/L — ABNORMAL LOW (ref 1.15–1.40)
Chloride: 102 mmol/L (ref 101–111)
GLUCOSE: 104 mg/dL — AB (ref 65–99)
HCT: 42 % (ref 36.0–46.0)
HEMOGLOBIN: 14.3 g/dL (ref 12.0–15.0)
POTASSIUM: 4.1 mmol/L (ref 3.5–5.1)
Sodium: 139 mmol/L (ref 135–145)
TCO2: 25 mmol/L (ref 0–100)

## 2016-09-30 LAB — CBC
HEMATOCRIT: 40.8 % (ref 36.0–46.0)
Hemoglobin: 13.8 g/dL (ref 12.0–15.0)
MCH: 30 pg (ref 26.0–34.0)
MCHC: 33.8 g/dL (ref 30.0–36.0)
MCV: 88.7 fL (ref 78.0–100.0)
Platelets: 244 10*3/uL (ref 150–400)
RBC: 4.6 MIL/uL (ref 3.87–5.11)
RDW: 13.9 % (ref 11.5–15.5)
WBC: 12.8 10*3/uL — AB (ref 4.0–10.5)

## 2016-09-30 LAB — PROTIME-INR
INR: 0.93
Prothrombin Time: 12.5 seconds (ref 11.4–15.2)

## 2016-09-30 LAB — CBG MONITORING, ED: Glucose-Capillary: 116 mg/dL — ABNORMAL HIGH (ref 65–99)

## 2016-09-30 LAB — I-STAT TROPONIN, ED: TROPONIN I, POC: 0 ng/mL (ref 0.00–0.08)

## 2016-09-30 LAB — APTT: aPTT: 31 seconds (ref 24–36)

## 2016-09-30 LAB — ETHANOL: Alcohol, Ethyl (B): <5 mg/dL (ref <5)

## 2016-09-30 MED ORDER — SODIUM CHLORIDE 0.9 % IV BOLUS (SEPSIS)
1000.0000 mL | Freq: Once | INTRAVENOUS | Status: AC
  Administered 2016-09-30: 1000 mL via INTRAVENOUS

## 2016-09-30 NOTE — Consult Note (Signed)
Admission H&P    Chief Complaint: Transient loss of consciousness and right-sided weakness.  HPI: Donna Evans is an 70 y.o. female with a history of TIA, TGA, systemic lupus, migraine headaches, hypertension, fibromyalgia, breast cancer and anxiety, brought to the ED and code stroke status with complaint of right side weakness following an apparent syncopal episode. She was last known well at him o'clock p.m. today. Speech has remained unchanged. No weakness of extremities was noted by EMT. Facial asymmetry was present intermittently. She's been taking aspirin daily. CT scan of her head showed no acute intracranial abnormality. NIH stroke score at the time of this evaluation was 1 (right facial numbness). Patient had intermittent nonphysiologic asymmetry of lower face, implicating a lower facial weakness. Face was symmetrical when distracted.  Past Medical History:  Diagnosis Date  . Anxiety   . Breast cancer ([place]) 1990   right breast cancer  . Chronic pain   . Edema   . Fibromyalgia   . Fibromyalgia   . Foot fracture    Bilateral  . GERD (gastroesophageal reflux disease)   . Hypertension   . Leg fracture, right   . Low back pain   . Lumbosacral neuritis   . 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 Migraine headache   . Peripheral neuropathy ([place])   . Systemic lupus erythematosus ([place])   . Thyroid disease   . TIA (transient ischemic attack) 11/28/2013  . Transient global amnesia 2011    Past Surgical History:  Procedure Laterality Date  . ABDOMINAL HYSTERECTOMY  1981  . APPENDECTOMY  1957  . AUGMENTATION MAMMAPLASTY Bilateral 1990   saline submuscular  . BACK SURGERY  1989  . BILATERAL TOTAL MASTECTOMY WITH AXILLARY LYMPH NODE DISSECTION  1990  . BREAST IMPLANT EXCHANGE Bilateral 05/11/2016   Procedure: REMOVAL AND REPLACEMENT OF BREAST IMPLANTS ;  Surgeon: Irene Limbo, MD;  Location: [facility];  Service: Plastics;  Laterality: Bilateral;  . CAPSULECTOMY  Bilateral 05/11/2016   Procedure: CAPSULECTOMY CAPSULORRAPHY ;  Surgeon: Irene Limbo, MD;  Location: [facility];  Service: Plastics;  Laterality: Bilateral;  . CHOLECYSTECTOMY  1979  . LIPOSUCTION Right 05/11/2016   Procedure: LIPOSUCTION;  Surgeon: Irene Limbo, MD;  Location: [facility];  Service: Plastics;  Laterality: Right;  . LIVER BIOPSY  2011  . MASTECTOMY SUBCUTANEOUS Bilateral 1990  . MASTOPEXY Bilateral 05/11/2016   Procedure: MASTOPEXY BILATERAL ;  Surgeon: Irene Limbo, MD;  Location: [facility];  Service: Plastics;  Laterality: Bilateral;    Family History  Problem Relation Age of Onset  . Atrial fibrillation Mother   . Atrial fibrillation Sister   . Cancer Sister   . Diabetes Sister   . Breast cancer Sister 56  . Diabetes Father   . Cancer Sister   . Diabetes Sister   . Atrial fibrillation Sister   . Kidney disease Maternal Aunt    Social History:  reports that she quit smoking about 11 years ago. Her smoking use included Cigarettes. She has a 40.00 pack-year smoking history. She has never used smokeless tobacco. She reports that she drinks alcohol. She reports that she does not use drugs.  Allergies:  Allergies  Allergen Reactions  . Ciprofloxacin Hives  . Fluconazole Hives  . Codeine Other (See Comments)    Reaction:  Unknown   . Demerol [Meperidine] Other (See Comments)    Reaction:  Unknown    . Duloxetine Hcl Other (See Comments)    Reaction:  Unknown   . Flagyl [Metronidazole] Other (See Comments)    Reaction:  Unknown   . Hydrocodone-Acetaminophen Hives  . Influenza Vaccines Swelling  . Morphine And Related Other (See Comments)    Reaction:  Unknown   . Amoxicillin-Pot Clavulanate Nausea And Vomiting  . Latex Rash  . Sulfamethoxazole-Trimethoprim Rash  . Valacyclovir Rash    Medications: Admission medications were reviewed by me.  ROS: History obtained from the patient  General ROS:  negative for - chills, fatigue, fever, night sweats, weight gain or weight loss Psychological ROS: negative for - behavioral disorder, hallucinations, memory difficulties, mood swings or suicidal ideation Ophthalmic ROS: negative for - blurry vision, double vision, eye pain or loss of vision ENT ROS: negative for - epistaxis, nasal discharge, oral lesions, sore throat, tinnitus or vertigo Allergy and Immunology ROS: negative for - hives or itchy/watery eyes Hematological and Lymphatic ROS: negative for - bleeding problems, bruising or swollen lymph nodes Endocrine ROS: negative for - galactorrhea, hair pattern changes, polydipsia/polyuria or temperature intolerance Respiratory ROS: negative for - cough, hemoptysis, shortness of breath or wheezing Cardiovascular ROS: negative for - chest pain, dyspnea on exertion, edema or irregular heartbeat Gastrointestinal ROS: negative for - abdominal pain, diarrhea, hematemesis, nausea/vomiting or stool incontinence Genito-Urinary ROS: negative for - dysuria, hematuria, incontinence or urinary frequency/urgency Musculoskeletal ROS: negative for - joint swelling or muscular weakness Neurological ROS: as noted in HPI Dermatological ROS: negative for rash and skin lesion changes  Physical Examination: Blood pressure 121/94, pulse 65, temperature 98.4 F (36.9 C), temperature source Oral, resp. rate 15, height 5\' 7"  (1.702 m), weight 75.1 kg (165 lb 9.1 oz), SpO2 96 %.  HEENT-  Normocephalic, no lesions, without obvious abnormality.  Normal external eye and conjunctiva.  Normal TM's bilaterally.  Normal auditory canals and external ears. Normal external nose, mucus membranes and septum.  Normal pharynx. Neck supple with no masses, nodes, nodules or enlargement. Cardiovascular - regular rate and rhythm, S1, S2 normal, no murmur, click, rub or gallop Lungs - chest clear, no wheezing, rales, normal symmetric air entry Abdomen - soft, non-tender; bowel sounds  normal; no masses,  no organomegaly Extremities - no joint deformities, effusion, or inflammation and no edema  Neurologic Examination: Mental Status: Alert, oriented, no acute distress.  Speech fluent without evidence of aphasia. Able to follow commands without difficulty. Cranial Nerves: II-Visual fields were normal. III/IV/VI-Pupils were equal and reacted normally to light. Extraocular movements were full and conjugate.    V/VII-subjective decrease in sensation over the right side of the face compared to the left; no objective facial weakness. VIII-normal. X-normal speech and symmetrical palatal movement. XI: trapezius strength/neck flexion strength normal bilaterally XII-midline tongue extension with normal strength. Motor: 5/5 bilaterally with normal tone and bulk Sensory: Normal throughout. Deep Tendon Reflexes: 1+ and symmetric. Plantars: Flexor bilaterally Cerebellar: Normal finger-to-nose testing. Carotid auscultation: Normal  Results for orders placed or performed during the hospital encounter of 09/30/16 (from the past 48 hour(s))  I-stat troponin, ED (not at [facility], [facility])     Status: None   Collection Time: 09/30/16  9:11 PM  Result Value Ref Range   Troponin i, poc 0.00 0.00 - 0.08 ng/mL   Comment 3            Comment: Due to the release kinetics of cTnI, a negative result within the first hours of the onset of symptoms does not rule out myocardial infarction with certainty. If myocardial infarction is still suspected, repeat the test at  appropriate intervals.   I-Stat Chem 8, ED  (not at [facility], [facility])     Status: Abnormal   Collection Time: 09/30/16  9:13 PM  Result Value Ref Range   Sodium 139 135 - 145 mmol/L   Potassium 4.1 3.5 - 5.1 mmol/L   Chloride 102 101 - 111 mmol/L   BUN 28 (H) 6 - 20 mg/dL   Creatinine, Ser 1.50 (H) 0.44 - 1.00 mg/dL   Glucose, Bld 104 (H) 65 - 99 mg/dL   Calcium, Ion 1.05 (L) 1.15 - 1.40 mmol/L   TCO2 25 0 - 100 mmol/L   Hemoglobin  14.3 12.0 - 15.0 g/dL   HCT 42.0 36.0 - 46.0 %  CBG monitoring, ED     Status: Abnormal   Collection Time: 09/30/16  9:20 PM  Result Value Ref Range   Glucose-Capillary 116 (H) 65 - 99 mg/dL   Comment 1 Notify RN    Comment 2 Document in Chart    Ct Head Code Stroke W/o Cm  Result Date: 09/30/2016 CLINICAL DATA:  Code stroke. Right-sided facial droop. Posterior headache. EXAM: CT HEAD WITHOUT CONTRAST TECHNIQUE: Contiguous axial images were obtained from the base of the skull through the vertex without intravenous contrast. COMPARISON:  MRI 05/13/2015.  CT 10/09/2014.  According when done FINDINGS: Brain: Mild chronic small-vessel ischemic changes of the deep white matter. No sign of acute infarction, mass lesion, hemorrhage, hydrocephalus or extra-axial collection. Vascular: Mild vascular calcification of the major vessels at the base of the brain. No hyperdense peripheral vessels. Skull: Normal Sinuses/Orbits: Normal/clear Other: None significant ASPECTS ([place] Stroke Program Early CT Score) - Ganglionic level infarction (caudate, lentiform nuclei, internal capsule, insula, M1-M3 cortex): 7 - Supraganglionic infarction (M4-M6 cortex): 3 Total score (0-10 with 10 being normal): 10 IMPRESSION: 1. No acute findings. Mild chronic small-vessel ischemic changes of the deep white matter. 2. ASPECTS is 10 These results were called by telephone at the time of interpretation on 09/30/2016 at 9:25 pm to Dr. Nicole Kindred, who verbally acknowledged these results. Electronically Signed   By: Nelson Chimes M.D.   On: 09/30/2016 21:27    Assessment/Plan 70 year old lady presenting with syncopal spell of unclear etiology, as well as complaint of right-sided weakness appears to be subjectively. Exam showed no objective signs of physiologic mass, including no right facial weakness. MRI of the brain showed no acute abnormality, including no signs of an acute stroke.  Recommendations: 1. No further neurodiagnostic  studies are indicated, including no further stroke risk assessment 2. EEG, routine adult study 3. Telemetry monitoring as planned  No further neurological intervention is indicated if EEG is unremarkable. We will plan to see this patient in follow-up on an as-needed basis.  C.R. Nicole Kindred, [facility] [phone]  09/30/2016, 9:36 PM

## 2016-09-30 NOTE — Progress Notes (Signed)
Code Stroke called on 70 y.o female. Pertinent symptoms include HTN, Migraines, TIA, SLE, Anxiety, Fibromyalgia, and breast cancer.  LSN 1900, acute symptoms noticed by family at 77. Pt from home with facial droop and right side weakness with syncope. Labs completed and Pt cleared for CT scan per EDP. CT scan completed STAT, reviewed per Neurologist Dr. Nicole Kindred as negative for acute abnormalities. NIHSS initially scored 1 for right facial droop. However per Neurologist facial asymmetry was present intermittently, and symmetrical when distracted, she was not scored for facial droop.  Pt for MRI tonight. No TPA at this time due to mild and improving symptoms.

## 2016-09-30 NOTE — H&P (Signed)
History and Physical    Donna Evans [id number] DOB: 03/21/1946 DOA: 09/30/2016  PCP: [facility] Consultants:  None Patient coming from: Home - lives with husband; NOK: husband  Chief Complaint: syncope  HPI: Donna Evans is a 70 y.o. female with medical history significant of TIAs, SLE, hypothyroidism, chronic pain, remote h/o breast cancer presenting with a syncopal episode.  Patient reports that she went out to meet with people about a fundraiser for their foundation.  They had already eaten and were standing there talking.  Got very flushed, broke out in a sweat, felt very weak like legs wouldn't hold her up.  Grabbed her sister.  Stumbled to one side, passed out.  Did not fall because they caught her.  LOC 1 minute.  Immediately recognized sister afterwards.  Pale, felt tingling in legs and head.  Became nauseated and vomited.  Able to walk with assistance.  Then family noticed her right mouth was drooping.  C/o numbness in right cheek, bad headache.  Called 911 and came to ER.  Of note, husband also noticed that mouth was drawing to one side 2 days ago.  She did not notice symptoms at that time.  Code stroke called in ER.  Negative MRI.  Now feeling much better after time. Ongoing frontal headache (previously occipital).    Had surgery May 30 - she has a h/o breast cancer with reconstruction and had a remote fall in 2015 with resultant dislocation of the right implant.  At that time, she fell on her face - but did not lose consciousness.  Something brushed against her leg and she fell, but she remembers that fall.  Fractured right shoulder.      ED Course: Per Dr. Jeneen Rinks: Pt with syncope after generalized weakness. ?facial droop after.  In Er w/u shows no acute CVA.  Pt with normal neuro exam.  EKG without changes.   I recommend, and agree with admit for obs 2/2 unprovoked syncope.  Review of Systems: As per HPI; otherwise 10 point review of systems  reviewed and negative.   Ambulatory Status:  Ambulates without assistance  Past Medical History:  Diagnosis Date  . Anxiety   . Breast cancer ([place]) 1990   right breast cancer  . Chronic pain   . Edema   . Fibromyalgia   . Foot fracture    Bilateral  . GERD (gastroesophageal reflux disease)   . Hypertension   . Leg fracture, right   . Low back pain   . Lumbosacral neuritis   . 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 Migraine headache   . Peripheral neuropathy ([place])   . Systemic lupus erythematosus ([place])   . Thyroid disease   . TIA (transient ischemic attack) 11/28/2013  . Transient global amnesia 2011    Past Surgical History:  Procedure Laterality Date  . ABDOMINAL HYSTERECTOMY  1981  . APPENDECTOMY  1957  . AUGMENTATION MAMMAPLASTY Bilateral 1990   saline submuscular  . BACK SURGERY  1989  . BILATERAL TOTAL MASTECTOMY WITH AXILLARY LYMPH NODE DISSECTION  1990  . BREAST IMPLANT EXCHANGE Bilateral 05/11/2016   Procedure: REMOVAL AND REPLACEMENT OF BREAST IMPLANTS ;  Surgeon: Irene Limbo, MD;  Location: [place];  Service: Plastics;  Laterality: Bilateral;  . CAPSULECTOMY Bilateral 05/11/2016   Procedure: CAPSULECTOMY CAPSULORRAPHY ;  Surgeon: Irene Limbo, MD;  Location: [place];  Service: Plastics;  Laterality: Bilateral;  . CHOLECYSTECTOMY  1979  . LIPOSUCTION Right 05/11/2016  Procedure: LIPOSUCTION;  Surgeon: Irene Limbo, MD;  Location: [place];  Service: Plastics;  Laterality: Right;  . LIVER BIOPSY  2011  . MASTECTOMY SUBCUTANEOUS Bilateral 1990  . MASTOPEXY Bilateral 05/11/2016   Procedure: MASTOPEXY BILATERAL ;  Surgeon: Irene Limbo, MD;  Location: [place];  Service: Plastics;  Laterality: Bilateral;    Social History   Social History  . Marital status: Married    Spouse name: N/A  . Number of children: 1  . Years of education: college3   Occupational History  .  Retired    Social History Main Topics  . Smoking status: Former Smoker    Packs/day: 1.00    Years: 40.00    Types: Cigarettes    Quit date: 08/28/2005  . Smokeless tobacco: Never Used     Comment: 9 years  . Alcohol use 0.0 oz/week     Comment: rare  . Drug use: No  . Sexual activity: No   Other Topics Concern  . Not on file   Social History Narrative  . No narrative on file    Allergies  Allergen Reactions  . Ciprofloxacin Nausea And Vomiting  . Fluconazole Hives  . Latex Anaphylaxis and Rash  . Morphine And Related Hives and Other (See Comments)    Reaction:  Unknown   . Codeine Hives and Other (See Comments)    Reaction:  Unknown   . Demerol [Meperidine] Hives and Other (See Comments)    Reaction:  Unknown    . Duloxetine Hcl Other (See Comments)    Reaction:  Unknown   . Flagyl [Metronidazole] Other (See Comments)    Reaction:  Unknown   . Hydrocodone-Acetaminophen Hives  . Influenza Vaccines Swelling  . Amoxicillin-Pot Clavulanate Nausea And Vomiting  . Sulfamethoxazole-Trimethoprim Nausea And Vomiting  . Valacyclovir Rash    Family History  Problem Relation Age of Onset  . Atrial fibrillation Mother   . Atrial fibrillation Sister   . Cancer Sister   . Diabetes Sister   . Breast cancer Sister 6  . Diabetes Father   . Cancer Sister   . Diabetes Sister   . Atrial fibrillation Sister   . Kidney disease Maternal Aunt     Prior to Admission medications   Medication Sig Start Date End Date Taking? Authorizing Provider  amoxicillin (AMOXIL) 500 MG capsule Take 500 mg by mouth 3 (three) times daily. Started medication on 09-21-16   Yes Historical Provider, MD  aspirin (GOODSENSE ASPIRIN) 81 MG chewable tablet Chew by mouth.   Yes Historical Provider, MD  escitalopram (LEXAPRO) 20 MG tablet Take 1 tablet (20 mg total) by mouth daily. 08/10/16  Yes Steele Sizer, MD  hydrochlorothiazide (HYDRODIURIL) 12.5 MG tablet Take 1 tablet (12.5 mg total) by mouth 2  (two) times daily. 07/02/16  Yes Steele Sizer, MD  gabapentin (NEURONTIN) 100 MG capsule Take 1-3 capsules (100-300 mg total) by mouth 3 (three) times daily. Patient not taking: Reported on 09/30/2016 07/02/16   Steele Sizer, MD  predniSONE (DELTASONE) 10 MG tablet Take 1 tablet (10 mg total) by mouth 2 (two) times daily with a meal. Patient not taking: Reported on 09/30/2016 08/10/16   Steele Sizer, MD  rizatriptan (MAXALT) 10 MG tablet Take 1 tablet (10 mg total) by mouth as needed for migraine. May repeat in 2 hours if needed Patient not taking: Reported on 09/30/2016 08/10/16   Steele Sizer, MD  tiZANidine (ZANAFLEX) 4 MG tablet Take 1 tablet (4 mg total)  by mouth at bedtime. Patient not taking: Reported on 09/30/2016 08/10/16   Steele Sizer, MD    Physical Exam: Vitals:   09/30/16 2330 09/30/16 2345 10/01/16 0000 10/01/16 0103  BP: 138/72 132/97 159/93 (!) 142/69  Pulse: 61 65 66 65  Resp: 19 13 12 16   Temp:    98.2 F (36.8 C)  TempSrc:    Oral  SpO2: 97% 98% 100% 100%  Weight:    75.2 kg (165 lb 12.8 oz)  Height:    5\' 7"  (1.702 m)     General: Appears calm and comfortable and is NAD Eyes:  EOMI, normal lids, iris ENT:  grossly normal hearing, lips & tongue, mmm Neck:  no LAD, masses or thyromegaly Cardiovascular:  RRR, no m/r/g. No LE edema.  Respiratory:  CTA bilaterally, no w/r/r. Normal respiratory effort. Abdomen:  soft, ntnd, NABS Skin:  no rash or induration seen on limited exam Musculoskeletal: grossly normal tone BUE/BLE, good ROM, no bony abnormality Psychiatric:  grossly normal mood and affect, speech fluent and appropriate, AOx3 Neurologic:  CN 2-12 grossly intact, moves all extremities in coordinated fashion, sensation intact other than slightly diminished sensation to right face in V2-3 distribution  Labs on Admission: I have personally reviewed following labs and imaging studies  CBC:  Recent Labs Lab 09/30/16 2105 09/30/16 2113  WBC 12.8*  --    NEUTROABS 9.8*  --   HGB 13.8 14.3  HCT 40.8 42.0  MCV 88.7  --   PLT 244  --    Basic Metabolic Panel:  Recent Labs Lab 09/30/16 2105 09/30/16 2113  NA 137 139  K 4.1 4.1  CL 104 102  CO2 26  --   GLUCOSE 109* 104*  BUN 25* 28*  CREATININE 1.43* 1.50*  CALCIUM 9.4  --    GFR: Estimated Creatinine Clearance: 36.9 mL/min (by C-G formula based on SCr of 1.5 mg/dL (H)). Liver Function Tests:  Recent Labs Lab 09/30/16 2105  AST 28  ALT 18  ALKPHOS 187*  BILITOT 0.4  PROT 7.3  ALBUMIN 4.1   No results for input(s): LIPASE, AMYLASE in the last 168 hours. No results for input(s): AMMONIA in the last 168 hours. Coagulation Profile:  Recent Labs Lab 09/30/16 2105  INR 0.93   Cardiac Enzymes: No results for input(s): CKTOTAL, CKMB, CKMBINDEX, TROPONINI in the last 168 hours. BNP (last 3 results) No results for input(s): PROBNP in the last 8760 hours. HbA1C: No results for input(s): HGBA1C in the last 72 hours. CBG:  Recent Labs Lab 09/30/16 2120  GLUCAP 116*   Lipid Profile: No results for input(s): CHOL, HDL, LDLCALC, TRIG, CHOLHDL, LDLDIRECT in the last 72 hours. Thyroid Function Tests: No results for input(s): TSH, T4TOTAL, FREET4, T3FREE, THYROIDAB in the last 72 hours. Anemia Panel: No results for input(s): VITAMINB12, FOLATE, FERRITIN, TIBC, IRON, RETICCTPCT in the last 72 hours. Urine analysis:    Component Value Date/Time   COLORURINE ORANGE (A) 08/29/2015 1400   APPEARANCEUR CLOUDY (A) 08/29/2015 1400   LABSPEC 1.014 08/29/2015 1400   PHURINE  08/29/2015 1400    TEST NOT REPORTED DUE TO COLOR INTERFERENCE OF URINE PIGMENT   GLUCOSEU (A) 08/29/2015 1400    TEST NOT REPORTED DUE TO COLOR INTERFERENCE OF URINE PIGMENT   HGBUR (A) 08/29/2015 1400    TEST NOT REPORTED DUE TO COLOR INTERFERENCE OF URINE PIGMENT   BILIRUBINUR neg 11/10/2015 1352   KETONESUR (A) 08/29/2015 1400    TEST NOT REPORTED DUE TO  COLOR INTERFERENCE OF URINE PIGMENT    PROTEINUR neg 11/10/2015 1352   PROTEINUR (A) 08/29/2015 1400    TEST NOT REPORTED DUE TO COLOR INTERFERENCE OF URINE PIGMENT   UROBILINOGEN negative 11/10/2015 1352   UROBILINOGEN 0.2 11/20/2013 0201   NITRITE neg 11/10/2015 1352   NITRITE (A) 08/29/2015 1400    TEST NOT REPORTED DUE TO COLOR INTERFERENCE OF URINE PIGMENT   LEUKOCYTESUR Trace (A) 11/10/2015 1352    Creatinine Clearance: Estimated Creatinine Clearance: 36.9 mL/min (by C-G formula based on SCr of 1.5 mg/dL (H)).  Sepsis Labs: @LABRCNTIP (procalcitonin:4,lacticidven:4) )No results found for this or any previous visit (from the past 240 hour(s)).   Radiological Exams on Admission: Mr Brain Wo Contrast  Result Date: 09/30/2016 CLINICAL DATA:  Generalized weakness, 5/10 headache and syncopal episode at 1910 hours. LEFT facial droop, RIGHT leg weakness. History of migraine, lupus, breast cancer and hypertension. EXAM: MRI HEAD WITHOUT CONTRAST TECHNIQUE: Multiplanar, multiecho pulse sequences of the brain and surrounding structures were obtained without intravenous contrast. COMPARISON:  CT HEAD September 30, 2016 at 2121 hours an MRI head May 13, 2015 FINDINGS: BRAIN: No reduced diffusion to suggest acute ischemia. No susceptibility artifact to suggest hemorrhage. The ventricles and sulci are normal for patient's age. Patchy supratentorial white matter T2 hyperintensities compatible with mild chronic small vessel ischemic disease, relatively stable. No suspicious parenchymal signal, masses or mass effect. No abnormal extra-axial fluid collections. No extra-axial masses though, contrast enhanced sequences would be more sensitive. VASCULAR: Normal major intracranial vascular flow voids present at skull base. SKULL AND UPPER CERVICAL SPINE: No abnormal sellar expansion. No suspicious calvarial bone marrow signal. Craniocervical junction maintained. SINUSES/ORBITS: The mastoid air-cells and included paranasal sinuses are well-aerated. The  included ocular globes and orbital contents are non-suspicious. OTHER: None. IMPRESSION: No acute intracranial process. Stable examination:  Negative MRI head for age. Electronically Signed   By: Elon Alas M.D.   On: 09/30/2016 22:35   Ct Head Code Stroke W/o Cm  Result Date: 09/30/2016 CLINICAL DATA:  Code stroke. Right-sided facial droop. Posterior headache. EXAM: CT HEAD WITHOUT CONTRAST TECHNIQUE: Contiguous axial images were obtained from the base of the skull through the vertex without intravenous contrast. COMPARISON:  MRI 05/13/2015.  CT 10/09/2014.  According when done FINDINGS: Brain: Mild chronic small-vessel ischemic changes of the deep white matter. No sign of acute infarction, mass lesion, hemorrhage, hydrocephalus or extra-axial collection. Vascular: Mild vascular calcification of the major vessels at the base of the brain. No hyperdense peripheral vessels. Skull: Normal Sinuses/Orbits: Normal/clear Other: None significant ASPECTS ([place] Stroke Program Early CT Score) - Ganglionic level infarction (caudate, lentiform nuclei, internal capsule, insula, M1-M3 cortex): 7 - Supraganglionic infarction (M4-M6 cortex): 3 Total score (0-10 with 10 being normal): 10 IMPRESSION: 1. No acute findings. Mild chronic small-vessel ischemic changes of the deep white matter. 2. ASPECTS is 10 These results were called by telephone at the time of interpretation on 09/30/2016 at 9:25 pm to Dr. Nicole Kindred, who verbally acknowledged these results. Electronically Signed   By: Nelson Chimes M.D.   On: 09/30/2016 21:27    EKG: Independently reviewed.  NSR with rate 64; RBBB with no evidence of acute ischemia  Assessment/Plan Principal Problem:   Syncope Active Problems:   Chronic pain syndrome   Hypertension, benign   AKI (acute kidney injury) ([place])   Syncope -By the [facility] syncope rule, she is in a low-risk group for serious outcome -Suspect vasovagal reaction leading to collapse -She does  have  mild AKI which may have contributed; will replete and follow -Will place in observation status and complete evaluation in AM with Echo and carotid dopplers -Once evaluation is complete - likely 10/20 or 10/21 - patient should be appropriate for discharge -She does have mild hyperglycemia on all recent tests, but it is not elevated enough to be realistic factor in current presentation -Will check UDS (ordered in ER) -Neurology also recommends an EEG, will order  HTN -Takes low-dose HCTZ -Will hold based on current AKI  Chronic pain -I have reviewed this patient in the [place] Controlled Substances Reporting System.  She is receiving medications from only one provider and appears to be taking them as prescribed.   DVT prophylaxis:  Lovenox  Code Status: DNR - confirmed with patient/family Family Communication: Sister present throughout  Disposition Plan:  Home once clinically improved Consults called: Neurology (by ER)  Admission status: It is my clinical opinion that referral for OBSERVATION is reasonable and necessary in this patient based on the above information provided. The aforementioned taken together are felt to place the patient at high risk for further clinical deterioration. However it is anticipated that the patient may be medically stable for discharge from the hospital within 24 to 48 hours.    Karmen Bongo MD [facility]  If 7PM-7AM, please contact night-coverage [email or web address] Password [email or web address]  10/01/2016, 1:40 AM

## 2016-09-30 NOTE — ED Provider Notes (Signed)
[facility] DEPT Provider Note   CSN: [id number] Arrival date & time: 09/30/16  2105     History   Chief Complaint Chief Complaint  Patient presents with  . Code Stroke  . Loss of Consciousness    HPI Donna Evans is a 70 y.o. female.  The history is provided by the patient and the EMS personnel.  Loss of Consciousness   This is a new problem. The current episode started 1 to 2 hours ago. The problem has been resolved. She lost consciousness for a period of less than one minute. The problem is associated with normal activity. Associated symptoms include congestion, diaphoresis, focal sensory loss, focal weakness and headaches. Pertinent negatives include abdominal pain, back pain, bowel incontinence, chest pain, confusion, fever, nausea, palpitations, seizures, slurred speech and vomiting. Her past medical history is significant for TIA. Her past medical history does not include seizures.  Cerebrovascular Accident  This is a new problem. The current episode started 1 to 2 hours ago. Associated symptoms include headaches. Pertinent negatives include no chest pain, no abdominal pain and no shortness of breath.    Past Medical History:  Diagnosis Date  . Anxiety   . Breast cancer ([place]) 1990   right breast cancer  . Chronic pain   . Edema   . Fibromyalgia   . Fibromyalgia   . Foot fracture    Bilateral  . GERD (gastroesophageal reflux disease)   . Hypertension   . Leg fracture, right   . Low back pain   . Lumbosacral neuritis   . 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 Migraine headache   . Peripheral neuropathy ([place])   . Systemic lupus erythematosus ([place])   . Thyroid disease   . TIA (transient ischemic attack) 11/28/2013  . Transient global amnesia 2011    Patient Active Problem List   Diagnosis Date Noted  . Migraine headache 08/10/2016  . Controlled substance agreement signed 06/12/2016  . Insomnia 06/12/2016  . Hypertension, benign 02/04/2016  . Easy  bruisability 11/13/2015  . Osteoporosis 11/13/2015  . Fibromyalgia syndrome 11/13/2015  . Generalized anxiety disorder 11/13/2015  . Chronic pain syndrome 11/13/2015  . Detrusor muscle hypertonia 09/11/2015  . Adrenal adenoma 09/02/2015  . Bladder infection, chronic 09/02/2015  . Incomplete bladder emptying 09/02/2015  . Renal colic 15/04/6978  . Frank hematuria 07/28/2015  . History of surgical procedure 06/18/2015  . Absence of breast 11/20/2014  . TIA (transient ischemic attack) 11/28/2013  . Hemiplegia of dominant side ([place]) 11/20/2013    Past Surgical History:  Procedure Laterality Date  . ABDOMINAL HYSTERECTOMY  1981  . APPENDECTOMY  1957  . AUGMENTATION MAMMAPLASTY Bilateral 1990   saline submuscular  . BACK SURGERY  1989  . BILATERAL TOTAL MASTECTOMY WITH AXILLARY LYMPH NODE DISSECTION  1990  . BREAST IMPLANT EXCHANGE Bilateral 05/11/2016   Procedure: REMOVAL AND REPLACEMENT OF BREAST IMPLANTS ;  Surgeon: Irene Limbo, MD;  Location: [facility];  Service: Plastics;  Laterality: Bilateral;  . CAPSULECTOMY Bilateral 05/11/2016   Procedure: CAPSULECTOMY CAPSULORRAPHY ;  Surgeon: Irene Limbo, MD;  Location: [facility];  Service: Plastics;  Laterality: Bilateral;  . CHOLECYSTECTOMY  1979  . LIPOSUCTION Right 05/11/2016   Procedure: LIPOSUCTION;  Surgeon: Irene Limbo, MD;  Location: [facility];  Service: Plastics;  Laterality: Right;  . LIVER BIOPSY  2011  . MASTECTOMY SUBCUTANEOUS Bilateral 1990  . MASTOPEXY Bilateral 05/11/2016   Procedure: MASTOPEXY BILATERAL ;  Surgeon: Irene Limbo, MD;  Location: [facility];  Service: Plastics;  Laterality: Bilateral;    OB History    No data available       Home Medications    Prior to Admission medications   Medication Sig Start Date End Date Taking? Authorizing Provider  aspirin (GOODSENSE ASPIRIN) 81 MG chewable tablet Chew by mouth.    Historical  Provider, MD  escitalopram (LEXAPRO) 20 MG tablet Take 1 tablet (20 mg total) by mouth daily. 08/10/16   Steele Sizer, MD  gabapentin (NEURONTIN) 100 MG capsule Take 1-3 capsules (100-300 mg total) by mouth 3 (three) times daily. 07/02/16   Steele Sizer, MD  hydrochlorothiazide (HYDRODIURIL) 12.5 MG tablet Take 1 tablet (12.5 mg total) by mouth 2 (two) times daily. 07/02/16   Steele Sizer, MD  ketoconazole (NIZORAL) 2 % shampoo Apply topically. 04/28/15   Historical Provider, MD  predniSONE (DELTASONE) 10 MG tablet Take 1 tablet (10 mg total) by mouth 2 (two) times daily with a meal. 08/10/16   Steele Sizer, MD  rizatriptan (MAXALT) 10 MG tablet Take 1 tablet (10 mg total) by mouth as needed for migraine. May repeat in 2 hours if needed 08/10/16   Steele Sizer, MD  tiZANidine (ZANAFLEX) 4 MG tablet Take 1 tablet (4 mg total) by mouth at bedtime. 08/10/16   Steele Sizer, MD    Family History Family History  Problem Relation Age of Onset  . Atrial fibrillation Mother   . Atrial fibrillation Sister   . Cancer Sister   . Diabetes Sister   . Breast cancer Sister 81  . Diabetes Father   . Cancer Sister   . Diabetes Sister   . Atrial fibrillation Sister   . Kidney disease Maternal Aunt     Social History Social History  Substance Use Topics  . Smoking status: Former Smoker    Packs/day: 1.00    Years: 40.00    Types: Cigarettes    Quit date: 08/28/2005  . Smokeless tobacco: Never Used     Comment: 9 years  . Alcohol use 0.0 oz/week     Comment: rare     Allergies   Ciprofloxacin; Fluconazole; Codeine; Demerol [meperidine]; Duloxetine hcl; Flagyl [metronidazole]; Hydrocodone-acetaminophen; Influenza vaccines; Morphine and related; Amoxicillin-pot clavulanate; Latex; Sulfamethoxazole-trimethoprim; and Valacyclovir   Review of Systems Review of Systems  Constitutional: Positive for diaphoresis. Negative for chills and fever.  HENT: Positive for congestion. Negative for ear  pain and sore throat.   Eyes: Negative for pain and visual disturbance.  Respiratory: Positive for cough. Negative for shortness of breath.   Cardiovascular: Positive for syncope. Negative for chest pain and palpitations.  Gastrointestinal: Negative for abdominal pain, bowel incontinence, nausea and vomiting.  Genitourinary: Negative for dysuria and hematuria.  Musculoskeletal: Negative for arthralgias and back pain.  Skin: Negative for color change and rash.  Neurological: Positive for focal weakness, syncope and headaches. Negative for seizures.  Psychiatric/Behavioral: Negative for confusion.  All other systems reviewed and are negative.    Physical Exam Updated Vital Signs BP 163/69 (BP Location: Right Arm)   Pulse 68   Resp 20   Ht 5' 7"  (1.702 m)   Wt 75.1 kg   SpO2 99%   BMI 25.93 kg/m   Physical Exam  Constitutional: She is oriented to person, place, and time. She appears well-developed and well-nourished.  HENT:  Head: Normocephalic and atraumatic.  Eyes: Conjunctivae and EOM are normal. Pupils are equal, round, and reactive to light.  Neck: Normal range of motion.  Neck supple.  Cardiovascular: Normal rate and regular rhythm.   Pulmonary/Chest: Effort normal and breath sounds normal. No respiratory distress.  Abdominal: Soft. There is no tenderness.  Musculoskeletal: She exhibits no edema.  Neurological: She is alert and oriented to person, place, and time. She displays normal reflexes. A cranial nerve deficit (tongue deviation to the right, facial sensation changes to right) is present. She exhibits normal muscle tone. Coordination normal.  Skin: Skin is warm and dry.  Psychiatric: She has a normal mood and affect.  Nursing note and vitals reviewed.    ED Treatments / Results  Labs (all labs ordered are listed, but only abnormal results are displayed) Labs Reviewed  CBC - Abnormal; Notable for the following:       Result Value   WBC 12.8 (*)    All other  components within normal limits  DIFFERENTIAL - Abnormal; Notable for the following:    Neutro Abs 9.8 (*)    All other components within normal limits  COMPREHENSIVE METABOLIC PANEL - Abnormal; Notable for the following:    Glucose, Bld 109 (*)    BUN 25 (*)    Creatinine, Ser 1.43 (*)    Alkaline Phosphatase 187 (*)    GFR calc non Af Amer 36 (*)    GFR calc Af Amer 42 (*)    All other components within normal limits  I-STAT CHEM 8, ED - Abnormal; Notable for the following:    BUN 28 (*)    Creatinine, Ser 1.50 (*)    Glucose, Bld 104 (*)    Calcium, Ion 1.05 (*)    All other components within normal limits  CBG MONITORING, ED - Abnormal; Notable for the following:    Glucose-Capillary 116 (*)    All other components within normal limits  ETHANOL  PROTIME-INR  APTT  TSH  URINALYSIS, ROUTINE W REFLEX MICROSCOPIC (NOT AT [facility])  Randolm Idol, ED    EKG  EKG Interpretation None       Radiology Mr Brain Wo Contrast  Result Date: 09/30/2016 CLINICAL DATA:  Generalized weakness, 5/10 headache and syncopal episode at 1910 hours. LEFT facial droop, RIGHT leg weakness. History of migraine, lupus, breast cancer and hypertension. EXAM: MRI HEAD WITHOUT CONTRAST TECHNIQUE: Multiplanar, multiecho pulse sequences of the brain and surrounding structures were obtained without intravenous contrast. COMPARISON:  CT HEAD September 30, 2016 at 2121 hours an MRI head May 13, 2015 FINDINGS: BRAIN: No reduced diffusion to suggest acute ischemia. No susceptibility artifact to suggest hemorrhage. The ventricles and sulci are normal for patient's age. Patchy supratentorial white matter T2 hyperintensities compatible with mild chronic small vessel ischemic disease, relatively stable. No suspicious parenchymal signal, masses or mass effect. No abnormal extra-axial fluid collections. No extra-axial masses though, contrast enhanced sequences would be more sensitive. VASCULAR: Normal major intracranial  vascular flow voids present at skull base. SKULL AND UPPER CERVICAL SPINE: No abnormal sellar expansion. No suspicious calvarial bone marrow signal. Craniocervical junction maintained. SINUSES/ORBITS: The mastoid air-cells and included paranasal sinuses are well-aerated. The included ocular globes and orbital contents are non-suspicious. OTHER: None. IMPRESSION: No acute intracranial process. Stable examination:  Negative MRI head for age. Electronically Signed   By: Elon Alas M.D.   On: 09/30/2016 22:35   Ct Head Code Stroke W/o Cm  Result Date: 09/30/2016 CLINICAL DATA:  Code stroke. Right-sided facial droop. Posterior headache. EXAM: CT HEAD WITHOUT CONTRAST TECHNIQUE: Contiguous axial images were obtained from the base of the skull through the  vertex without intravenous contrast. COMPARISON:  MRI 05/13/2015.  CT 10/09/2014.  According when done FINDINGS: Brain: Mild chronic small-vessel ischemic changes of the deep white matter. No sign of acute infarction, mass lesion, hemorrhage, hydrocephalus or extra-axial collection. Vascular: Mild vascular calcification of the major vessels at the base of the brain. No hyperdense peripheral vessels. Skull: Normal Sinuses/Orbits: Normal/clear Other: None significant ASPECTS (Superior Stroke Program Early CT Score) - Ganglionic level infarction (caudate, lentiform nuclei, internal capsule, insula, M1-M3 cortex): 7 - Supraganglionic infarction (M4-M6 cortex): 3 Total score (0-10 with 10 being normal): 10 IMPRESSION: 1. No acute findings. Mild chronic small-vessel ischemic changes of the deep white matter. 2. ASPECTS is 10 These results were called by telephone at the time of interpretation on 09/30/2016 at 9:25 pm to Dr. Nicole Kindred, who verbally acknowledged these results. Electronically Signed   By: Nelson Chimes M.D.   On: 09/30/2016 21:27    Procedures Procedures (including critical care time)  Medications Ordered in ED Medications - No data to  display   Initial Impression / Assessment and Plan / ED Course  I have reviewed the triage vital signs and the nursing notes.  Pertinent labs & imaging results that were available during my care of the patient were reviewed by me and considered in my medical decision making (see chart for details).  Clinical Course   Ms. Lepak is a 70 year old female with past medical history significant for TIA, anxiety, thyroid disease, migraine headache, hypertension, right breast cancer who presents for syncope, facial droop, and right-sided weakness.  A code stroke was initiated prior to arrival, the patient was immediately taken to CT scanner after verifying her airway.  CT head demonstrates no acute abnormalities.  Neurology evaluated the patient and feels this is unlikely to be stroke requiring lytics.  MR brain obtained, demonstrates no acute abnormalities.  Patient symptoms resolved and she returned to baseline.  EKG obtained, demonstrates normal sinus rhythm with right bundle branch block.  Nonspecific ST-T changes.  Labs ordered including CBC, CMP, UDS, UA, troponin.  Results significant for leukocytosis, elevated creatinine, and elevated alk phos.  Patient records were reviewed and the patient has an echo cardiogram from 2 years ago as well as a clean cath from 2 years ago.  Patient will be admitted for observation after syncopal event.   Final Clinical Impressions(s) / ED Diagnoses   Final diagnoses:  Transient cerebral ischemia, unspecified type  Syncope, unspecified syncope type    New Prescriptions New Prescriptions   No medications on file     Elveria Rising, MD 10/01/16 1158    Tanna Furry, MD 10/11/16 2119

## 2016-09-30 NOTE — ED Triage Notes (Signed)
Pt brought to ED by EMS from home for generalized weakness, 5/10 HA and a syncope episode at 1910, pt having some facial droop on the left side, some weakness on the right leg, las know well today at 1900 had a syncope episode at 1910,  BP 152 72, CBG 143. PT AO x 4, NAD noticed.

## 2016-10-01 ENCOUNTER — Encounter (HOSPITAL_COMMUNITY): Payer: Self-pay | Admitting: Internal Medicine

## 2016-10-01 ENCOUNTER — Observation Stay (HOSPITAL_BASED_OUTPATIENT_CLINIC_OR_DEPARTMENT_OTHER): Payer: PPO

## 2016-10-01 ENCOUNTER — Observation Stay (HOSPITAL_BASED_OUTPATIENT_CLINIC_OR_DEPARTMENT_OTHER)
Admit: 2016-10-01 | Discharge: 2016-10-01 | Disposition: A | Discharge status: Home / Self Care | Payer: PPO | Attending: Internal Medicine | Admitting: Internal Medicine

## 2016-10-01 DIAGNOSIS — G894 Chronic pain syndrome: Secondary | ICD-10-CM | POA: Diagnosis not present

## 2016-10-01 DIAGNOSIS — G459 Transient cerebral ischemic attack, unspecified: Secondary | ICD-10-CM

## 2016-10-01 DIAGNOSIS — R55 Syncope and collapse: Secondary | ICD-10-CM

## 2016-10-01 DIAGNOSIS — N179 Acute kidney failure, unspecified: Secondary | ICD-10-CM | POA: Diagnosis not present

## 2016-10-01 LAB — COMPREHENSIVE METABOLIC PANEL
ALBUMIN: 3.7 g/dL (ref 3.5–5.0)
ALK PHOS: 176 U/L — AB (ref 38–126)
ALT: 16 U/L (ref 14–54)
AST: 26 U/L (ref 15–41)
Anion gap: 8 (ref 5–15)
BUN: 16 mg/dL (ref 6–20)
CALCIUM: 8.7 mg/dL — AB (ref 8.9–10.3)
CO2: 25 mmol/L (ref 22–32)
CREATININE: 1.08 mg/dL — AB (ref 0.44–1.00)
Chloride: 105 mmol/L (ref 101–111)
GFR calc Af Amer: 59 mL/min — ABNORMAL LOW (ref >60)
GFR calc non Af Amer: 51 mL/min — ABNORMAL LOW (ref >60)
GLUCOSE: 97 mg/dL (ref 65–99)
Potassium: 3.6 mmol/L (ref 3.5–5.1)
SODIUM: 138 mmol/L (ref 135–145)
Total Bilirubin: 0.6 mg/dL (ref 0.3–1.2)
Total Protein: 6.6 g/dL (ref 6.5–8.1)

## 2016-10-01 LAB — URINALYSIS, ROUTINE W REFLEX MICROSCOPIC
BILIRUBIN URINE: NEGATIVE
Glucose, UA: NEGATIVE mg/dL
HGB URINE DIPSTICK: NEGATIVE
Ketones, ur: NEGATIVE mg/dL
Leukocytes, UA: NEGATIVE
Nitrite: NEGATIVE
PH: 6.5 (ref 5.0–8.0)
Protein, ur: NEGATIVE mg/dL
SPECIFIC GRAVITY, URINE: 1.007 (ref 1.005–1.030)

## 2016-10-01 LAB — MAGNESIUM: Magnesium: 2.1 mg/dL (ref 1.7–2.4)

## 2016-10-01 LAB — ECHOCARDIOGRAM COMPLETE
Height: 67 in
WEIGHTICAEL: 2652.8 [oz_av]

## 2016-10-01 LAB — TSH: TSH: 1.879 u[IU]/mL (ref 0.350–4.500)

## 2016-10-01 MED ORDER — ENOXAPARIN SODIUM 40 MG/0.4ML ~~LOC~~ SOLN
40.0000 mg | SUBCUTANEOUS | Status: DC
  Administered 2016-10-01: 40 mg via SUBCUTANEOUS
  Filled 2016-10-01: qty 0.4

## 2016-10-01 MED ORDER — TRAMADOL HCL 50 MG PO TABS
50.0000 mg | ORAL_TABLET | Freq: Four times a day (QID) | ORAL | Status: DC | PRN
Start: 2016-10-01 — End: 2016-10-01

## 2016-10-01 MED ORDER — ACETAMINOPHEN 650 MG RE SUPP: 650.0000 mg | Freq: Four times a day (QID) | RECTAL | Status: DC | PRN

## 2016-10-01 MED ORDER — LACTATED RINGERS IV SOLN
INTRAVENOUS | Status: DC
  Administered 2016-10-01: 03:00:00 via INTRAVENOUS

## 2016-10-01 MED ORDER — ACETAMINOPHEN 325 MG PO TABS
650.0000 mg | ORAL_TABLET | Freq: Four times a day (QID) | ORAL | Status: DC | PRN
  Administered 2016-10-01: 650 mg via ORAL

## 2016-10-01 MED ORDER — SODIUM CHLORIDE 0.9% FLUSH
3.0000 mL | Freq: Two times a day (BID) | INTRAVENOUS | Status: DC
  Administered 2016-10-01: 3 mL via INTRAVENOUS

## 2016-10-01 MED ORDER — ZOLPIDEM TARTRATE 5 MG PO TABS
5.0000 mg | ORAL_TABLET | Freq: Every evening | ORAL | Status: DC | PRN
  Administered 2016-10-01: 5 mg via ORAL
  Filled 2016-10-01: qty 1

## 2016-10-01 MED ORDER — ASPIRIN 81 MG PO CHEW
81.0000 mg | CHEWABLE_TABLET | Freq: Every day | ORAL | Status: DC
  Administered 2016-10-01: 81 mg via ORAL
  Filled 2016-10-01: qty 1

## 2016-10-01 MED ORDER — ONDANSETRON HCL 4 MG PO TABS
4.0000 mg | ORAL_TABLET | Freq: Four times a day (QID) | ORAL | Status: DC | PRN
Start: 2016-10-01 — End: 2016-10-01

## 2016-10-01 MED ORDER — ONDANSETRON HCL 4 MG/2ML IJ SOLN: 4.0000 mg | Freq: Four times a day (QID) | INTRAMUSCULAR | Status: DC | PRN

## 2016-10-01 MED ORDER — ESCITALOPRAM OXALATE 10 MG PO TABS
20.0000 mg | ORAL_TABLET | Freq: Every day | ORAL | Status: DC
  Administered 2016-10-01: 20 mg via ORAL
  Filled 2016-10-01: qty 2

## 2016-10-01 NOTE — Progress Notes (Signed)
  Echocardiogram 2D Echocardiogram has been performed.  Donna Evans 10/01/2016, 10:44 AM

## 2016-10-01 NOTE — Progress Notes (Signed)
EEG completed; results pending.    

## 2016-10-01 NOTE — ED Provider Notes (Signed)
Pt seen and evaluated.  D/W Dr. Marlou Sa.  Pt with syncope after generalized weakness. ?facial droop after.  In Er w/u shows no acute CVA.  Pt with normal neuro exam.  EKG without changes.   I recommend, and agree with admit for obs 2/2 unprovoked syncope.   Tanna Furry, MD 10/01/16 [phone]

## 2016-10-01 NOTE — Progress Notes (Signed)
Preliminary results by tech: Bilateral carotid duplex complete. No hemodynamically significant stenosis bilaterally, no visible plaque. Vertebral arteries antegrade bilaterally. [place], RVT 11:14 AM  10/01/2016

## 2016-10-01 NOTE — Procedures (Signed)
ELECTROENCEPHALOGRAM REPORT  Date of Study: 10/01/2016  Patient's Name: Donna Evans MRN: [id number] Date of Birth: 1946/04/27  Referring Provider: Dr. Yvonna Alanis  Clinical History: This is a 70 year old woman with a syncopal episode.  Medications: acetaminophen (TYLENOL) tablet 650 mg  aspirin chewable tablet 81 mg  enoxaparin (LOVENOX) injection 40 mg  escitalopram (LEXAPRO) tablet 20 mg  traMADol (ULTRAM) tablet 50 mg  zolpidem (AMBIEN) tablet 5 mg   Technical Summary: A multichannel digital EEG recording measured by the international 10-20 system with electrodes applied with paste and impedances below 5000 ohms performed in our laboratory with EKG monitoring in an awake and asleep patient.  Hyperventilation and photic stimulation were not performed.  The digital EEG was referentially recorded, reformatted, and digitally filtered in a variety of bipolar and referential montages for optimal display.    Description: The patient is awake and asleep during the recording.  During maximal wakefulness, there is a symmetric, low voltage 9 Hz posterior dominant rhythm that attenuates with eye opening.  The record is symmetric.  During drowsiness and sleep, there is an increase in theta slowing of the background.  Vertex waves and symmetric sleep spindles were seen.  Hyperventilation and photic stimulation were not performed.  There were no epileptiform discharges or electrographic seizures seen.    EKG lead showed sinus bradycardia at 60 bpm.  Impression: This awake and asleep EEG is normal.    Clinical Correlation: A normal EEG does not exclude a clinical diagnosis of epilepsy. Clinical correlation is advised.   Ellouise Newer, M.D.

## 2016-10-01 NOTE — Progress Notes (Signed)
Patient received from the ED at 0050, A/OX4. Patient oriented to room ,equipment, and all safety measures. VSS. MAEW. Will monitor closely overnight.

## 2016-10-01 NOTE — Discharge Summary (Signed)
Physician Discharge Summary  Donna Evans [id number] DOB: [birth year] DOA: 09/30/2016  PCP: Donna Norris, MD  Admit date: 09/30/2016 Discharge date: 10/01/2016  Admitted From: Home Disposition:  Home  Recommendations for Outpatient Follow-up:  1. Follow up with PCP on Tuesday, October 23 at previously scheduled appointment 2. Patient to be called by cardiology and set up with Holter monitor next week 3. Follow up with Neurology as needed 4. Repeat BMP on Tuesday when you see your new physician, discuss holter monitor   Home Health: No Equipment/Devices:None  Discharge Condition: Stable CODE STATUS:Full Code Diet recommendation: Heart Healthy   Brief/Interim Summary: Donna Evans is a 70 y.o. female with medical history significant of TIAs, SLE, hypothyroidism, chronic pain, remote h/o breast cancer presenting with a syncopal episode.  Patient reports that she went out to meet with people about a fundraiser for their foundation.  They had already eaten and were standing there talking.  Got very flushed, broke out in a sweat, felt very weak like legs wouldn't hold her up.  Grabbed her sister.  Stumbled to one side, passed out.  Did not fall because they caught her.  LOC 1 minute.  Immediately recognized sister afterwards.  Pale, felt tingling in legs and head.  Became nauseated and vomited.  Able to walk with assistance.  Then family noticed her right mouth was drooping.  C/o numbness in right cheek, bad headache.  Called 911 and came to ER.  Of note, husband also noticed that mouth was drawing to one side 2 days ago.  She did not notice symptoms at that time.  Code stroke called in ER.  Negative MRI.  Now feeling much better after time. Ongoing frontal headache (previously occipital).    Had surgery May 30 - she has a h/o breast cancer with reconstruction and had a remote fall in 2015 with resultant dislocation of the right implant.  At that time, she fell on her face - but did  not lose consciousness.  Something brushed against her leg and she fell, but she remembers that fall.  Fractured right shoulder.     Per Dr. Jeneen Rinks: Pt with syncope after generalized weakness. ?facial droop after. In Er w/u shows no acute CVA. Pt with normal neuro exam. EKG without changes. MRI negative.  EEG performed- negative for seizure. Echocardiogram and carotid ultrasound performed.  Carotid ultrasound showed no stenosis, and echocardiogram showed LVEF is higher at 60-65% with mild LVH. There is now grade 1 diastolic dysfunction. RA Pressure is elevated.   Discharge Diagnoses:  Principal Problem:   Syncope Active Problems:   Chronic pain syndrome   Hypertension, benign   AKI (acute kidney injury) [facility])    Discharge Instructions  Discharge Instructions    Call MD for:  difficulty breathing, headache or visual disturbances    Complete by:  As directed    Call MD for:  extreme fatigue    Complete by:  As directed    Call MD for:  persistant dizziness or light-headedness    Complete by:  As directed    Call MD for:  persistant nausea and vomiting    Complete by:  As directed    Call MD for:  severe uncontrolled pain    Complete by:  As directed    Call MD for:  temperature >100.4    Complete by:  As directed    Diet - low sodium heart healthy    Complete by:  As directed    Increase  activity slowly    Complete by:  As directed        Medication List    TAKE these medications   escitalopram 20 MG tablet Commonly known as:  LEXAPRO Take 1 tablet (20 mg total) by mouth daily.   GOODSENSE ASPIRIN 81 MG chewable tablet Generic drug:  aspirin Chew by mouth.   hydrochlorothiazide 12.5 MG tablet Commonly known as:  HYDRODIURIL Take 1 tablet (12.5 mg total) by mouth 2 (two) times daily.       Allergies  Allergen Reactions  . Ciprofloxacin Nausea And Vomiting  . Fluconazole Hives  . Latex Anaphylaxis and Rash  . Morphine And Related Hives and Other (See  Comments)    Reaction:  Unknown   . Codeine Hives and Other (See Comments)    Reaction:  Unknown   . Demerol [Meperidine] Hives and Other (See Comments)    Reaction:  Unknown    . Duloxetine Hcl Other (See Comments)    Reaction:  Unknown   . Flagyl [Metronidazole] Other (See Comments)    Reaction:  Unknown   . Hydrocodone-Acetaminophen Hives  . Influenza Vaccines Swelling  . Tape     Pt allergic to Adhesive tape and latex.(  Skin breaks out)  . Amoxicillin-Pot Clavulanate Nausea And Vomiting  . Sulfamethoxazole-Trimethoprim Nausea And Vomiting  . Valacyclovir Rash    Consultations:  Neurology   Procedures/Studies: Mr Brain Wo Contrast  Result Date: 09/30/2016 CLINICAL DATA:  Generalized weakness, 5/10 headache and syncopal episode at 1910 hours. LEFT facial droop, RIGHT leg weakness. History of migraine, lupus, breast cancer and hypertension. EXAM: MRI HEAD WITHOUT CONTRAST TECHNIQUE: Multiplanar, multiecho pulse sequences of the brain and surrounding structures were obtained without intravenous contrast. COMPARISON:  CT HEAD September 30, 2016 at 2121 hours an MRI head May 13, 2015 FINDINGS: BRAIN: No reduced diffusion to suggest acute ischemia. No susceptibility artifact to suggest hemorrhage. The ventricles and sulci are normal for patient's age. Patchy supratentorial white matter T2 hyperintensities compatible with mild chronic small vessel ischemic disease, relatively stable. No suspicious parenchymal signal, masses or mass effect. No abnormal extra-axial fluid collections. No extra-axial masses though, contrast enhanced sequences would be more sensitive. VASCULAR: Normal major intracranial vascular flow voids present at skull base. SKULL AND UPPER CERVICAL SPINE: No abnormal sellar expansion. No suspicious calvarial bone marrow signal. Craniocervical junction maintained. SINUSES/ORBITS: The mastoid air-cells and included paranasal sinuses are well-aerated. The included ocular globes  and orbital contents are non-suspicious. OTHER: None. IMPRESSION: No acute intracranial process. Stable examination:  Negative MRI head for age. Electronically Signed   By: Elon Alas M.D.   On: 09/30/2016 22:35   Ct Head Code Stroke W/o Cm  Result Date: 09/30/2016 CLINICAL DATA:  Code stroke. Right-sided facial droop. Posterior headache. EXAM: CT HEAD WITHOUT CONTRAST TECHNIQUE: Contiguous axial images were obtained from the base of the skull through the vertex without intravenous contrast. COMPARISON:  MRI 05/13/2015.  CT 10/09/2014.  According when done FINDINGS: Brain: Mild chronic small-vessel ischemic changes of the deep white matter. No sign of acute infarction, mass lesion, hemorrhage, hydrocephalus or extra-axial collection. Vascular: Mild vascular calcification of the major vessels at the base of the brain. No hyperdense peripheral vessels. Skull: Normal Sinuses/Orbits: Normal/clear Other: None significant ASPECTS ([place] Stroke Program Early CT Score) - Ganglionic level infarction (caudate, lentiform nuclei, internal capsule, insula, M1-M3 cortex): 7 - Supraganglionic infarction (M4-M6 cortex): 3 Total score (0-10 with 10 being normal): 10 IMPRESSION: 1. No acute findings. Mild chronic  small-vessel ischemic changes of the deep white matter. 2. ASPECTS is 10 These results were called by telephone at the time of interpretation on 09/30/2016 at 9:25 pm to Dr. Nicole Kindred, who verbally acknowledged these results. Electronically Signed   By: Nelson Chimes M.D.   On: 09/30/2016 21:27   Echo: LVEF is higher at 60-65%   with mild LVH. There is now grade 1 diastolic dysfunction. RA   Pressure is elevated.  Carotid Ultrasound: no stenosis   Subjective: Patient seen and evaluated.  Just returned from ultrasound of carotids, EEG and echocardiogram.  Voices she would like to discharge when results are in.  Feels good- denies dizziness, lightheadedness, nausea, vomiting, syncope, dysuria, hematuria  and no palpitations.  Discussed getting set up with Holter monitor outpatient.  Patient voices understanding.  Has appointment with new primary care physician on Tuesday.  Discharge Exam: Vitals:   10/01/16 0000 10/01/16 0103  BP: 159/93 (!) 142/69  Pulse: 66 65  Resp: 12 16  Temp:  98.2 F (36.8 C)   Vitals:   09/30/16 2330 09/30/16 2345 10/01/16 0000 10/01/16 0103  BP: 138/72 132/97 159/93 (!) 142/69  Pulse: 61 65 66 65  Resp: 19 13 12 16   Temp:    98.2 F (36.8 C)  TempSrc:    Oral  SpO2: 97% 98% 100% 100%  Weight:    75.2 kg (165 lb 12.8 oz)  Height:    5\' 7"  (1.702 m)    General: Pt is alert, awake, not in acute distress Cardiovascular: RRR, S1/S2 +, no rubs, no gallops Respiratory: CTA bilaterally, no wheezing, no rhonchi Abdominal: Soft, NT, ND, bowel sounds + Extremities: no edema, no cyanosis    The results of significant diagnostics from this hospitalization (including imaging, microbiology, ancillary and laboratory) are listed below for reference.     Microbiology: No results found for this or any previous visit (from the past 240 hour(s)).   Labs: BNP (last 3 results) No results for input(s): BNP in the last 8760 hours. Basic Metabolic Panel:  Recent Labs Lab 09/30/16 2105 09/30/16 2113  NA 137 139  K 4.1 4.1  CL 104 102  CO2 26  --   GLUCOSE 109* 104*  BUN 25* 28*  CREATININE 1.43* 1.50*  CALCIUM 9.4  --    Liver Function Tests:  Recent Labs Lab 09/30/16 2105  AST 28  ALT 18  ALKPHOS 187*  BILITOT 0.4  PROT 7.3  ALBUMIN 4.1   No results for input(s): LIPASE, AMYLASE in the last 168 hours. No results for input(s): AMMONIA in the last 168 hours. CBC:  Recent Labs Lab 09/30/16 2105 09/30/16 2113  WBC 12.8*  --   NEUTROABS 9.8*  --   HGB 13.8 14.3  HCT 40.8 42.0  MCV 88.7  --   PLT 244  --    Cardiac Enzymes: No results for input(s): CKTOTAL, CKMB, CKMBINDEX, TROPONINI in the last 168 hours. BNP: Invalid input(s):  POCBNP CBG:  Recent Labs Lab 09/30/16 2120  GLUCAP 116*   D-Dimer No results for input(s): DDIMER in the last 72 hours. Hgb A1c No results for input(s): HGBA1C in the last 72 hours. Lipid Profile No results for input(s): CHOL, HDL, LDLCALC, TRIG, CHOLHDL, LDLDIRECT in the last 72 hours. Thyroid function studies  Recent Labs  10/01/16 1259  TSH 1.879   Anemia work up No results for input(s): VITAMINB12, FOLATE, FERRITIN, TIBC, IRON, RETICCTPCT in the last 72 hours. Urinalysis    Component Value Date/Time  COLORURINE YELLOW 10/01/2016 [place] 10/01/2016 1348   LABSPEC 1.007 10/01/2016 1348   PHURINE 6.5 10/01/2016 1348   GLUCOSEU NEGATIVE 10/01/2016 1348   HGBUR NEGATIVE 10/01/2016 [place] 10/01/2016 1348   BILIRUBINUR neg 11/10/2015 [place] 10/01/2016 1348   PROTEINUR NEGATIVE 10/01/2016 1348   UROBILINOGEN negative 11/10/2015 1352   UROBILINOGEN 0.2 11/20/2013 0201   NITRITE NEGATIVE 10/01/2016 1348   LEUKOCYTESUR NEGATIVE 10/01/2016 1348   Sepsis Labs Invalid input(s): PROCALCITONIN,  WBC,  LACTICIDVEN Microbiology No results found for this or any previous visit (from the past 240 hour(s)).   Time coordinating discharge: Over 30 minutes  SIGNED:   Newman Pies, MD  [facility] 10/01/2016, 3:30 PM Pager [phone] If 7PM-7AM, please contact night-coverage [email or web address] Password [email or web address]

## 2016-10-01 NOTE — ED Notes (Signed)
Pt AO x 4, VSS, came to ED after having a syncope episode, with some stroke symptoms, facial droop to the left side, droop is resolved last neuro check= 0, pt passes swallow screen, CT scan of head and MRI head negative, pt to be keep for observation and possible carotid veins [place] in am.

## 2016-10-01 NOTE — Care Management Note (Signed)
Case Management Note  Patient Details  Name: Donna Evans MRN: [id number] Date of Birth: 1946/10/04  Subjective/Objective:  Pt in with syncope. She is from home with her spouse.                  Action/Plan: Pt discharging home with self care. No further needs per CM.   Expected Discharge Date:                  Expected Discharge Plan:  Home/Self Care  In-House Referral:     Discharge planning Services     Post Acute Care Choice:    Choice offered to:     DME Arranged:    DME Agency:     HH Arranged:    [place] Agency:     Status of Service:  Completed, signed off  If discussed at [employer] of Stay Meetings, dates discussed:    Additional Comments:  Pollie Friar, RN 10/01/2016, 4:04 PM

## 2016-10-01 NOTE — Care Management Obs Status (Signed)
[place] NOTIFICATION   Patient Details  Name: KRITIKA LAPOLE MRN: [id number] Date of Birth: 04/29/1946   Medicare Observation Status Notification Given:  Yes (MRI negative)    Pollie Friar, RN 10/01/2016, 3:29 PM

## 2016-10-04 LAB — VAS US CAROTID
LCCADDIAS: -19 cm/s
LEFT ECA DIAS: -14 cm/s
LEFT VERTEBRAL DIAS: -11 cm/s
LICADDIAS: -22 cm/s
LICADSYS: -73 cm/s
Left CCA dist sys: -84 cm/s
Left CCA prox dias: 14 cm/s
Left CCA prox sys: 60 cm/s
RCCAPSYS: -119 cm/s
RIGHT ECA DIAS: -10 cm/s
RIGHT VERTEBRAL DIAS: -13 cm/s
Right CCA prox dias: -18 cm/s
Right cca dist sys: -70 cm/s

## 2016-10-05 DIAGNOSIS — K219 Gastro-esophageal reflux disease without esophagitis: Secondary | ICD-10-CM | POA: Diagnosis not present

## 2016-10-05 DIAGNOSIS — M810 Age-related osteoporosis without current pathological fracture: Secondary | ICD-10-CM | POA: Diagnosis not present

## 2016-10-05 DIAGNOSIS — F411 Generalized anxiety disorder: Secondary | ICD-10-CM | POA: Diagnosis not present

## 2016-10-05 DIAGNOSIS — G4709 Other insomnia: Secondary | ICD-10-CM | POA: Diagnosis not present

## 2016-10-05 DIAGNOSIS — R32 Unspecified urinary incontinence: Secondary | ICD-10-CM | POA: Diagnosis not present

## 2016-10-05 DIAGNOSIS — M797 Fibromyalgia: Secondary | ICD-10-CM | POA: Diagnosis not present

## 2016-10-05 DIAGNOSIS — G8929 Other chronic pain: Secondary | ICD-10-CM | POA: Diagnosis not present

## 2016-10-06 DIAGNOSIS — R002 Palpitations: Secondary | ICD-10-CM | POA: Diagnosis not present

## 2016-10-11 ENCOUNTER — Encounter (INDEPENDENT_AMBULATORY_CARE_PROVIDER_SITE_OTHER): Payer: Self-pay

## 2016-10-22 ENCOUNTER — Encounter: Payer: Self-pay | Admitting: Unknown Physician Specialty

## 2016-10-22 ENCOUNTER — Ambulatory Visit (INDEPENDENT_AMBULATORY_CARE_PROVIDER_SITE_OTHER): Payer: PPO | Admitting: Unknown Physician Specialty

## 2016-10-22 VITALS — BP 128/78 | HR 66 | Temp 98.3°F | Ht 66.0 in | Wt 162.2 lb

## 2016-10-22 DIAGNOSIS — F411 Generalized anxiety disorder: Secondary | ICD-10-CM

## 2016-10-22 DIAGNOSIS — G894 Chronic pain syndrome: Secondary | ICD-10-CM

## 2016-10-22 DIAGNOSIS — G43009 Migraine without aura, not intractable, without status migrainosus: Secondary | ICD-10-CM | POA: Diagnosis not present

## 2016-10-22 DIAGNOSIS — I10 Essential (primary) hypertension: Secondary | ICD-10-CM | POA: Diagnosis not present

## 2016-10-22 MED ORDER — ESCITALOPRAM OXALATE 20 MG PO TABS: 20.0000 mg | ORAL_TABLET | Freq: Every day | ORAL | 2 refills | Status: DC

## 2016-10-22 MED ORDER — ZOLPIDEM TARTRATE 10 MG PO TABS: 10.0000 mg | ORAL_TABLET | Freq: Every day | ORAL | 0 refills | Status: DC

## 2016-10-22 MED ORDER — HYDROCHLOROTHIAZIDE 12.5 MG PO TABS: 12.5000 mg | ORAL_TABLET | Freq: Two times a day (BID) | ORAL | 5 refills | Status: DC

## 2016-10-22 MED ORDER — BUTORPHANOL TARTRATE 10 MG/ML NA SOLN: 1.0000 | NASAL | 0 refills | Status: DC | PRN

## 2016-10-22 MED ORDER — CYCLOBENZAPRINE HCL 10 MG PO TABS: 10.0000 mg | ORAL_TABLET | Freq: Every day | ORAL | 1 refills | Status: DC

## 2016-10-22 MED ORDER — ALPRAZOLAM 1 MG PO TABS: 1.0000 mg | ORAL_TABLET | Freq: Three times a day (TID) | ORAL | 0 refills | Status: DC | PRN

## 2016-10-22 NOTE — Addendum Note (Signed)
Addended by: Kathrine Haddock on: 10/22/2016 05:04 PM   Modules accepted: Orders

## 2016-10-22 NOTE — Assessment & Plan Note (Addendum)
Takes Xanax and Lexapro.

## 2016-10-22 NOTE — Assessment & Plan Note (Signed)
Taking Stadol for relief.  She uses 5 bottles/month

## 2016-10-22 NOTE — Assessment & Plan Note (Signed)
Pt on a number of risky medications.  Refer to pain clinic

## 2016-10-22 NOTE — Progress Notes (Addendum)
BP 128/78 (BP Location: Left Arm, Patient Position: Sitting, Cuff Size: Normal)   Pulse 66   Temp 98.3 F (36.8 C)   Ht 5\' 6"  (1.676 m)   Wt 162 lb 3.2 oz (73.6 kg)   SpO2 97%   BMI 26.18 kg/m    Subjective:    Patient ID: Donna Evans, female    DOB: Mar 18, 1946, 70 y.o.   MRN: [id number]  HPI: Donna Evans is a 70 y.o. female  Chief Complaint  Patient presents with  . Establish Care   Pt is here as a new patient.  She states Dr. Lucita Lora has been writing her prescriptions for  About 4 years.  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    She takes the Ambien nightly and if she doesn't she doesn't take it at all.  Takes Lexapro 20 mg.     She takes Stadol nasal spray and uses 5 bottles/month.  She states Dr. Lucita Lora has given her refills of medications.    Was in the hospital for syncopal episode and wearing a heart monitor.      Relevant past medical, surgical, family and social history reviewed and updated as indicated. Interim medical history since our last visit reviewed. Allergies and medications reviewed and updated.  Review of Systems  Per HPI unless specifically indicated above     Objective:    BP 128/78 (BP Location: Left Arm, Patient Position: Sitting, Cuff Size: Normal)   Pulse 66   Temp 98.3 F (36.8 C)   Ht 5\' 6"  (1.676 m)   Wt 162 lb 3.2 oz (73.6 kg)   SpO2 97%   BMI 26.18 kg/m   Wt Readings from Last 3 Encounters:  10/22/16 162 lb 3.2 oz (73.6 kg)  10/01/16 165 lb 12.8 oz (75.2 kg)  08/10/16 156 lb 8 oz (71 kg)    Physical Exam  Constitutional: She is oriented to person, place, and time. She appears well-developed and well-nourished. No distress.  HENT:  Head: Normocephalic and atraumatic.  Eyes: Conjunctivae and lids are normal. Right eye exhibits no discharge. Left eye exhibits no discharge. No scleral  icterus.  Neck: Normal range of motion. Neck supple. No JVD present. Carotid bruit is not present.  Cardiovascular: Normal rate, regular rhythm and normal heart sounds.   Pulmonary/Chest: Effort normal and breath sounds normal.  Abdominal: Normal appearance. There is no splenomegaly or hepatomegaly.  Musculoskeletal: Normal range of motion.  Neurological: She is alert and oriented to person, place, and time.  Skin: Skin is warm, dry and intact. No rash noted. No pallor.  Psychiatric: She has a normal mood and affect. Her behavior is normal. Judgment and thought content normal.    Results for orders placed or performed during the hospital encounter of 09/30/16  Ethanol  Result Value Ref Range   Alcohol, Ethyl (B) <5 <5 mg/dL  Protime-INR  Result Value Ref Range   Prothrombin Time 12.5 11.4 - 15.2 seconds   INR 0.93   APTT  Result Value Ref Range   aPTT 31 24 - 36 seconds  CBC  Result Value Ref Range   WBC 12.8 (H) 4.0 - 10.5 K/uL   RBC 4.60 3.87 - 5.11 MIL/uL   Hemoglobin 13.8 12.0 - 15.0 g/dL   HCT 40.8 36.0 - 46.0 %  MCV 88.7 78.0 - 100.0 fL   MCH 30.0 26.0 - 34.0 pg   MCHC 33.8 30.0 - 36.0 g/dL   RDW 13.9 11.5 - 15.5 %   Platelets 244 150 - 400 K/uL  Differential  Result Value Ref Range   Neutrophils Relative % 76 %   Neutro Abs 9.8 (H) 1.7 - 7.7 K/uL   Lymphocytes Relative 17 %   Lymphs Abs 2.1 0.7 - 4.0 K/uL   Monocytes Relative 5 %   Monocytes Absolute 0.6 0.1 - 1.0 K/uL   Eosinophils Relative 1 %   Eosinophils Absolute 0.2 0.0 - 0.7 K/uL   Basophils Relative 1 %   Basophils Absolute 0.1 0.0 - 0.1 K/uL  Comprehensive metabolic panel  Result Value Ref Range   Sodium 137 135 - 145 mmol/L   Potassium 4.1 3.5 - 5.1 mmol/L   Chloride 104 101 - 111 mmol/L   CO2 26 22 - 32 mmol/L   Glucose, Bld 109 (H) 65 - 99 mg/dL   BUN 25 (H) 6 - 20 mg/dL   Creatinine, Ser 1.43 (H) 0.44 - 1.00 mg/dL   Calcium 9.4 8.9 - 10.3 mg/dL   Total Protein 7.3 6.5 - 8.1 g/dL   Albumin  4.1 3.5 - 5.0 g/dL   AST 28 15 - 41 U/L   ALT 18 14 - 54 U/L   Alkaline Phosphatase 187 (H) 38 - 126 U/L   Total Bilirubin 0.4 0.3 - 1.2 mg/dL   GFR calc non Af Amer 36 (L) >60 mL/min   GFR calc Af Amer 42 (L) >60 mL/min   Anion gap 7 5 - 15  TSH  Result Value Ref Range   TSH 1.879 0.350 - 4.500 uIU/mL  Urinalysis, Routine w reflex microscopic (not at [facility])  Result Value Ref Range   Color, Urine YELLOW YELLOW   APPearance CLEAR CLEAR   Specific Gravity, Urine 1.007 1.005 - 1.030   pH 6.5 5.0 - 8.0   Glucose, UA NEGATIVE NEGATIVE mg/dL   Hgb urine dipstick NEGATIVE NEGATIVE   Bilirubin Urine NEGATIVE NEGATIVE   Ketones, ur NEGATIVE NEGATIVE mg/dL   Protein, ur NEGATIVE NEGATIVE mg/dL   Nitrite NEGATIVE NEGATIVE   Leukocytes, UA NEGATIVE NEGATIVE  Magnesium  Result Value Ref Range   Magnesium 2.1 1.7 - 2.4 mg/dL  Comprehensive metabolic panel  Result Value Ref Range   Sodium 138 135 - 145 mmol/L   Potassium 3.6 3.5 - 5.1 mmol/L   Chloride 105 101 - 111 mmol/L   CO2 25 22 - 32 mmol/L   Glucose, Bld 97 65 - 99 mg/dL   BUN 16 6 - 20 mg/dL   Creatinine, Ser 1.08 (H) 0.44 - 1.00 mg/dL   Calcium 8.7 (L) 8.9 - 10.3 mg/dL   Total Protein 6.6 6.5 - 8.1 g/dL   Albumin 3.7 3.5 - 5.0 g/dL   AST 26 15 - 41 U/L   ALT 16 14 - 54 U/L   Alkaline Phosphatase 176 (H) 38 - 126 U/L   Total Bilirubin 0.6 0.3 - 1.2 mg/dL   GFR calc non Af Amer 51 (L) >60 mL/min   GFR calc Af Amer 59 (L) >60 mL/min   Anion gap 8 5 - 15  I-Stat Chem 8, ED  (not at [facility], [facility])  Result Value Ref Range   Sodium 139 135 - 145 mmol/L   Potassium 4.1 3.5 - 5.1 mmol/L   Chloride 102 101 - 111 mmol/L   BUN 28 (H)  6 - 20 mg/dL   Creatinine, Ser 1.50 (H) 0.44 - 1.00 mg/dL   Glucose, Bld 104 (H) 65 - 99 mg/dL   Calcium, Ion 1.05 (L) 1.15 - 1.40 mmol/L   TCO2 25 0 - 100 mmol/L   Hemoglobin 14.3 12.0 - 15.0 g/dL   HCT 42.0 36.0 - 46.0 %  I-stat troponin, ED (not at [facility], [facility])  Result Value Ref Range   Troponin i,  poc 0.00 0.00 - 0.08 ng/mL   Comment 3          CBG monitoring, ED  Result Value Ref Range   Glucose-Capillary 116 (H) 65 - 99 mg/dL   Comment 1 Notify RN    Comment 2 Document in Chart   Echocardiogram  Result Value Ref Range   Weight 2,652.8 oz   Height 67 in   BP 142/69 mmHg  VAS US CAROTID  Result Value Ref Range   Right CCA prox sys -119 cm/s   Right CCA prox dias -18 cm/s   Right cca dist sys -70 cm/s   Left CCA prox sys 60 cm/s   Left CCA prox dias 14 cm/s   Left CCA dist sys -84 cm/s   Left CCA dist dias -19 cm/s   Left ICA dist sys -73 cm/s   Left ICA dist dias -22 cm/s   RIGHT ECA DIAS -10.00 cm/s   RIGHT VERTEBRAL DIAS -13.00 cm/s   LEFT ECA DIAS -14.00 cm/s   LEFT VERTEBRAL DIAS -11.00 cm/s      Assessment & Plan:   Problem List Items Addressed This Visit      Unprioritized   Chronic pain syndrome - Primary    Pt on a number of risky medications.  Refer to pain clinic      Relevant Orders   Ambulatory referral to Pain Clinic   Generalized anxiety disorder    Takes Xanax and Lexapro.        Relevant Medications   escitalopram (LEXAPRO) 20 MG tablet   Hypertension, benign    Stable, continue present medications.        Relevant Medications   hydrochlorothiazide (HYDRODIURIL) 12.5 MG tablet   Migraine headache    Taking Stadol for relief.  She uses 5 bottles/month      Relevant Medications   cyclobenzaprine (FLEXERIL) 10 MG tablet   escitalopram (LEXAPRO) 20 MG tablet   hydrochlorothiazide (HYDRODIURIL) 12.5 MG tablet   butorphanol (STADOL) 10 MG/ML nasal spray   Other Relevant Orders   Ambulatory referral to Pain Clinic      Pt is on a number of medications that I don't typically prescribe but I am unable to stop due to significant risks involved.  Will start flexeril 10 mg QHS with 1/2 Ambien.  I am uncomfortable about the amount of Stadol she is on and cannot manage it long term.  Discussed pt with Dr. Wynetta Emery and next visit I will refer  her to pain management to get long term treatment.  Decrease Xanax to #45 for a 4 weeks as pt states she does not take 3/day.    Pt had a referral to the pain clinic but on hold due to availability.   Refusing counseling at this time.      Follow up plan:mg  Return in about 4 weeks (around 11/19/2016) for 30 minute visit.

## 2016-10-22 NOTE — Assessment & Plan Note (Signed)
Stable, continue present medications.   

## 2016-10-25 ENCOUNTER — Telehealth: Payer: Self-pay | Admitting: Unknown Physician Specialty

## 2016-10-25 NOTE — Telephone Encounter (Signed)
Discussed with pt that we are referring to a pain clinic.

## 2016-11-10 ENCOUNTER — Ambulatory Visit: Payer: PPO | Admitting: Family Medicine

## 2016-11-19 ENCOUNTER — Ambulatory Visit: Payer: PPO | Admitting: Unknown Physician Specialty

## 2016-12-22 DIAGNOSIS — G8929 Other chronic pain: Secondary | ICD-10-CM | POA: Diagnosis not present

## 2016-12-22 DIAGNOSIS — F411 Generalized anxiety disorder: Secondary | ICD-10-CM | POA: Diagnosis not present

## 2016-12-22 DIAGNOSIS — K219 Gastro-esophageal reflux disease without esophagitis: Secondary | ICD-10-CM | POA: Diagnosis not present

## 2016-12-22 DIAGNOSIS — M797 Fibromyalgia: Secondary | ICD-10-CM | POA: Diagnosis not present

## 2016-12-22 DIAGNOSIS — M810 Age-related osteoporosis without current pathological fracture: Secondary | ICD-10-CM | POA: Diagnosis not present

## 2016-12-22 DIAGNOSIS — R32 Unspecified urinary incontinence: Secondary | ICD-10-CM | POA: Diagnosis not present

## 2016-12-22 DIAGNOSIS — G4709 Other insomnia: Secondary | ICD-10-CM | POA: Diagnosis not present

## 2017-02-23 DIAGNOSIS — J209 Acute bronchitis, unspecified: Secondary | ICD-10-CM | POA: Diagnosis not present

## 2017-02-23 DIAGNOSIS — R32 Unspecified urinary incontinence: Secondary | ICD-10-CM | POA: Diagnosis not present

## 2017-02-23 DIAGNOSIS — G4709 Other insomnia: Secondary | ICD-10-CM | POA: Diagnosis not present

## 2017-02-23 DIAGNOSIS — G8929 Other chronic pain: Secondary | ICD-10-CM | POA: Diagnosis not present

## 2017-02-23 DIAGNOSIS — M797 Fibromyalgia: Secondary | ICD-10-CM | POA: Diagnosis not present

## 2017-02-23 DIAGNOSIS — K219 Gastro-esophageal reflux disease without esophagitis: Secondary | ICD-10-CM | POA: Diagnosis not present

## 2017-02-23 DIAGNOSIS — F411 Generalized anxiety disorder: Secondary | ICD-10-CM | POA: Diagnosis not present

## 2017-02-23 DIAGNOSIS — M810 Age-related osteoporosis without current pathological fracture: Secondary | ICD-10-CM | POA: Diagnosis not present

## 2017-02-28 DIAGNOSIS — J209 Acute bronchitis, unspecified: Secondary | ICD-10-CM | POA: Diagnosis not present

## 2017-02-28 DIAGNOSIS — M797 Fibromyalgia: Secondary | ICD-10-CM | POA: Diagnosis not present

## 2017-02-28 DIAGNOSIS — K219 Gastro-esophageal reflux disease without esophagitis: Secondary | ICD-10-CM | POA: Diagnosis not present

## 2017-02-28 DIAGNOSIS — G8929 Other chronic pain: Secondary | ICD-10-CM | POA: Diagnosis not present

## 2017-02-28 DIAGNOSIS — G4709 Other insomnia: Secondary | ICD-10-CM | POA: Diagnosis not present

## 2017-02-28 DIAGNOSIS — R32 Unspecified urinary incontinence: Secondary | ICD-10-CM | POA: Diagnosis not present

## 2017-02-28 DIAGNOSIS — M810 Age-related osteoporosis without current pathological fracture: Secondary | ICD-10-CM | POA: Diagnosis not present

## 2017-02-28 DIAGNOSIS — F411 Generalized anxiety disorder: Secondary | ICD-10-CM | POA: Diagnosis not present

## 2017-03-10 DIAGNOSIS — Z9013 Acquired absence of bilateral breasts and nipples: Secondary | ICD-10-CM | POA: Diagnosis not present

## 2017-03-30 DIAGNOSIS — K219 Gastro-esophageal reflux disease without esophagitis: Secondary | ICD-10-CM | POA: Diagnosis not present

## 2017-03-30 DIAGNOSIS — F411 Generalized anxiety disorder: Secondary | ICD-10-CM | POA: Diagnosis not present

## 2017-03-30 DIAGNOSIS — M810 Age-related osteoporosis without current pathological fracture: Secondary | ICD-10-CM | POA: Diagnosis not present

## 2017-03-30 DIAGNOSIS — J209 Acute bronchitis, unspecified: Secondary | ICD-10-CM | POA: Diagnosis not present

## 2017-03-30 DIAGNOSIS — R32 Unspecified urinary incontinence: Secondary | ICD-10-CM | POA: Diagnosis not present

## 2017-03-30 DIAGNOSIS — G8929 Other chronic pain: Secondary | ICD-10-CM | POA: Diagnosis not present

## 2017-03-30 DIAGNOSIS — M797 Fibromyalgia: Secondary | ICD-10-CM | POA: Diagnosis not present

## 2017-03-30 DIAGNOSIS — G4709 Other insomnia: Secondary | ICD-10-CM | POA: Diagnosis not present

## 2017-03-30 DIAGNOSIS — F4320 Adjustment disorder, unspecified: Secondary | ICD-10-CM | POA: Diagnosis not present

## 2017-04-06 DIAGNOSIS — R32 Unspecified urinary incontinence: Secondary | ICD-10-CM | POA: Diagnosis not present

## 2017-04-06 DIAGNOSIS — K219 Gastro-esophageal reflux disease without esophagitis: Secondary | ICD-10-CM | POA: Diagnosis not present

## 2017-04-06 DIAGNOSIS — F411 Generalized anxiety disorder: Secondary | ICD-10-CM | POA: Diagnosis not present

## 2017-04-06 DIAGNOSIS — G8929 Other chronic pain: Secondary | ICD-10-CM | POA: Diagnosis not present

## 2017-04-06 DIAGNOSIS — G4709 Other insomnia: Secondary | ICD-10-CM | POA: Diagnosis not present

## 2017-04-06 DIAGNOSIS — M797 Fibromyalgia: Secondary | ICD-10-CM | POA: Diagnosis not present

## 2017-04-06 DIAGNOSIS — M810 Age-related osteoporosis without current pathological fracture: Secondary | ICD-10-CM | POA: Diagnosis not present

## 2017-04-06 DIAGNOSIS — F4320 Adjustment disorder, unspecified: Secondary | ICD-10-CM | POA: Diagnosis not present

## 2017-06-03 DIAGNOSIS — F411 Generalized anxiety disorder: Secondary | ICD-10-CM | POA: Diagnosis not present

## 2017-06-03 DIAGNOSIS — G8929 Other chronic pain: Secondary | ICD-10-CM | POA: Diagnosis not present

## 2017-06-03 DIAGNOSIS — M797 Fibromyalgia: Secondary | ICD-10-CM | POA: Diagnosis not present

## 2017-06-03 DIAGNOSIS — I10 Essential (primary) hypertension: Secondary | ICD-10-CM | POA: Diagnosis not present

## 2017-06-03 DIAGNOSIS — K219 Gastro-esophageal reflux disease without esophagitis: Secondary | ICD-10-CM | POA: Diagnosis not present

## 2017-06-03 DIAGNOSIS — M810 Age-related osteoporosis without current pathological fracture: Secondary | ICD-10-CM | POA: Diagnosis not present

## 2017-06-03 DIAGNOSIS — R32 Unspecified urinary incontinence: Secondary | ICD-10-CM | POA: Diagnosis not present

## 2017-06-03 DIAGNOSIS — G4709 Other insomnia: Secondary | ICD-10-CM | POA: Diagnosis not present

## 2017-06-03 DIAGNOSIS — M7071 Other bursitis of hip, right hip: Secondary | ICD-10-CM | POA: Diagnosis not present

## 2017-06-03 DIAGNOSIS — Z0001 Encounter for general adult medical examination with abnormal findings: Secondary | ICD-10-CM | POA: Diagnosis not present

## 2017-06-13 DIAGNOSIS — Z0001 Encounter for general adult medical examination with abnormal findings: Secondary | ICD-10-CM | POA: Diagnosis not present

## 2017-06-13 DIAGNOSIS — F411 Generalized anxiety disorder: Secondary | ICD-10-CM | POA: Diagnosis not present

## 2017-06-13 DIAGNOSIS — J209 Acute bronchitis, unspecified: Secondary | ICD-10-CM | POA: Diagnosis not present

## 2017-06-13 DIAGNOSIS — K219 Gastro-esophageal reflux disease without esophagitis: Secondary | ICD-10-CM | POA: Diagnosis not present

## 2017-06-13 DIAGNOSIS — M7071 Other bursitis of hip, right hip: Secondary | ICD-10-CM | POA: Diagnosis not present

## 2017-06-13 DIAGNOSIS — M797 Fibromyalgia: Secondary | ICD-10-CM | POA: Diagnosis not present

## 2017-06-13 DIAGNOSIS — M810 Age-related osteoporosis without current pathological fracture: Secondary | ICD-10-CM | POA: Diagnosis not present

## 2017-06-13 DIAGNOSIS — J189 Pneumonia, unspecified organism: Secondary | ICD-10-CM | POA: Diagnosis not present

## 2017-06-13 DIAGNOSIS — I10 Essential (primary) hypertension: Secondary | ICD-10-CM | POA: Diagnosis not present

## 2017-06-13 DIAGNOSIS — G8929 Other chronic pain: Secondary | ICD-10-CM | POA: Diagnosis not present

## 2017-06-13 DIAGNOSIS — G4709 Other insomnia: Secondary | ICD-10-CM | POA: Diagnosis not present

## 2017-06-13 DIAGNOSIS — R32 Unspecified urinary incontinence: Secondary | ICD-10-CM | POA: Diagnosis not present

## 2017-06-29 DIAGNOSIS — I10 Essential (primary) hypertension: Secondary | ICD-10-CM | POA: Diagnosis not present

## 2017-06-29 DIAGNOSIS — M7062 Trochanteric bursitis, left hip: Secondary | ICD-10-CM | POA: Diagnosis not present

## 2017-06-29 DIAGNOSIS — M7060 Trochanteric bursitis, unspecified hip: Secondary | ICD-10-CM | POA: Insufficient documentation

## 2017-06-29 DIAGNOSIS — Z0001 Encounter for general adult medical examination with abnormal findings: Secondary | ICD-10-CM | POA: Diagnosis not present

## 2017-06-29 DIAGNOSIS — M7542 Impingement syndrome of left shoulder: Secondary | ICD-10-CM | POA: Insufficient documentation

## 2017-07-04 ENCOUNTER — Ambulatory Visit: Payer: PPO | Attending: Nurse Practitioner | Admitting: Nurse Practitioner

## 2017-07-04 DIAGNOSIS — G8929 Other chronic pain: Secondary | ICD-10-CM | POA: Diagnosis not present

## 2017-07-04 DIAGNOSIS — M79609 Pain in unspecified limb: Secondary | ICD-10-CM | POA: Insufficient documentation

## 2017-07-04 DIAGNOSIS — S42209A Unspecified fracture of upper end of unspecified humerus, initial encounter for closed fracture: Secondary | ICD-10-CM | POA: Insufficient documentation

## 2017-07-04 DIAGNOSIS — M1710 Unilateral primary osteoarthritis, unspecified knee: Secondary | ICD-10-CM | POA: Insufficient documentation

## 2017-07-04 DIAGNOSIS — M7550 Bursitis of unspecified shoulder: Secondary | ICD-10-CM | POA: Insufficient documentation

## 2017-07-04 DIAGNOSIS — F4320 Adjustment disorder, unspecified: Secondary | ICD-10-CM | POA: Diagnosis not present

## 2017-07-04 DIAGNOSIS — M25569 Pain in unspecified knee: Secondary | ICD-10-CM | POA: Insufficient documentation

## 2017-07-04 DIAGNOSIS — M179 Osteoarthritis of knee, unspecified: Secondary | ICD-10-CM | POA: Insufficient documentation

## 2017-07-04 DIAGNOSIS — F1190 Opioid use, unspecified, uncomplicated: Secondary | ICD-10-CM | POA: Insufficient documentation

## 2017-07-04 DIAGNOSIS — R32 Unspecified urinary incontinence: Secondary | ICD-10-CM | POA: Diagnosis not present

## 2017-07-04 DIAGNOSIS — F411 Generalized anxiety disorder: Secondary | ICD-10-CM | POA: Diagnosis not present

## 2017-07-04 DIAGNOSIS — Z0001 Encounter for general adult medical examination with abnormal findings: Secondary | ICD-10-CM | POA: Diagnosis not present

## 2017-07-04 DIAGNOSIS — K219 Gastro-esophageal reflux disease without esophagitis: Secondary | ICD-10-CM | POA: Diagnosis not present

## 2017-07-04 DIAGNOSIS — N189 Chronic kidney disease, unspecified: Secondary | ICD-10-CM | POA: Diagnosis not present

## 2017-07-04 DIAGNOSIS — M810 Age-related osteoporosis without current pathological fracture: Secondary | ICD-10-CM | POA: Diagnosis not present

## 2017-07-04 DIAGNOSIS — S63509A Unspecified sprain of unspecified wrist, initial encounter: Secondary | ICD-10-CM | POA: Insufficient documentation

## 2017-07-04 DIAGNOSIS — G4709 Other insomnia: Secondary | ICD-10-CM | POA: Diagnosis not present

## 2017-07-04 DIAGNOSIS — M797 Fibromyalgia: Secondary | ICD-10-CM | POA: Diagnosis not present

## 2017-07-04 DIAGNOSIS — M1991 Primary osteoarthritis, unspecified site: Secondary | ICD-10-CM | POA: Insufficient documentation

## 2017-07-04 DIAGNOSIS — M25579 Pain in unspecified ankle and joints of unspecified foot: Secondary | ICD-10-CM | POA: Insufficient documentation

## 2017-07-04 DIAGNOSIS — Z79891 Long term (current) use of opiate analgesic: Secondary | ICD-10-CM | POA: Insufficient documentation

## 2017-07-04 NOTE — Progress Notes (Deleted)
Patient's Name: Donna Evans  MRN: [id number]  Referring Provider: Jodi Marble, MD  DOB: Jul 08, 1946  PCP: Ashok Norris, MD  DOS: 07/04/2017  Note by: Dionisio David NP  Service setting: Ambulatory outpatient  Specialty: Interventional Pain Management  Location: [facility] (AMB) Pain Management Facility    Patient type: New Patient    Primary Reason(s) for Visit: Initial Patient Evaluation CC: No chief complaint on file.  HPI  Donna Evans is a 71 y.o. year old, female patient, who comes today for an initial evaluation. She has Transient cerebral ischemia; Adrenal adenoma; Chronic cystitis; Gross hematuria; Incomplete bladder emptying; Overactive detrusor; Acquired absence of both breasts and nipples; Hemiplegia of dominant side ([place]); History of reconstruction of both breasts; Renal colic; Easy bruisability; Osteoporosis; Fibromyalgia syndrome; Generalized anxiety disorder; Chronic pain syndrome; Hypertension, benign; Controlled substance agreement signed; Insomnia; Migraine headache; Syncope; and AKI (acute kidney injury) ([place]) on her problem list.. Her primarily concern today is the No chief complaint on file.  Pain Assessment: Location:     Radiating:   Onset:   Duration:   Quality:   Severity:  /10 (self-reported pain score)  Note: Reported level is compatible with observation.                   Effect on ADL:   Timing:   Modifying factors:    Onset and Duration: {Hx; Onset and Duration:210120511} Cause of pain: {Hx; Cause:210120521} Severity: {Pain Severity:210120502} Timing: {Symptoms; Timing:210120501} Aggravating Factors: {Causes; Aggravating pain factors:210120507} Alleviating Factors: {Causes; Alleviating Factors:210120500} Associated Problems: {Hx; Associated problems:210120515} Quality of Pain: {Hx; Symptom quality or Descriptor:210120531} Previous Examinations or Tests: {Hx; Previous examinations or test:210120529} Previous Treatments: {Hx; Previous  Treatment:210120503}  The patient comes into the clinics today for the first time for a chronic pain management evaluation. ***  Today I took the time to provide the patient with information regarding this pain practice. The patient was informed that the practice is divided into two sections: an interventional pain management section, as well as a completely separate and distinct medication management section. I explained that there are procedure days for interventional therapies, and evaluation days for follow-ups and medication management. Because of the amount of documentation required during both, they are kept separated. This means that there is the possibility that *** may be scheduled for a procedure on one day, and medication management the next. I have also informed *** that because of staffing and facility limitations, this practice will no longer take patients for medication management only. To illustrate the reasons for this, I gave the patient the example of surgeons, and how inappropriate it would be to refer a patient to his/her care, just to write for the post-surgical antibiotics on a surgery done by a different surgeon.   Because interventional pain management is part of the board-certified specialty for the doctors, the patient was informed that joining this practice means that they are open to any and all interventional therapies. I made it clear that this does not mean that they will be forced to have any procedures done. What this means is that I believe interventional therapies to be essential part of the diagnosis and proper management of chronic pain conditions. Therefore, patients not interested in these interventional alternatives will be better served under the care of a different practitioner.  The patient was also made aware of my Comprehensive Pain Management Safety Guidelines where by joining this practice, they limit all of their nerve blocks and joint injections to those  done by our practice, for as long as we are retained to manage their care. Historic Controlled Substance Pharmacotherapy Review  PMP and historical list of controlled substances: *** Highest opioid analgesic regimen found: *** Most recent opioid analgesic: *** Current opioid analgesics: *** Highest recorded MME/day: *** mg/day MME/day: *** mg/day Medications: The patient did not bring the medication(s) to the appointment, as requested in our "New Patient Package" Pharmacodynamics: Desired effects: Analgesia: The patient reports >50% benefit. Reported improvement in function: The patient reports medication allows her to accomplish basic ADLs. Clinically meaningful improvement in function (CMIF): Sustained CMIF goals met Perceived effectiveness: Described as relatively effective, allowing for increase in activities of daily living (ADL) Undesirable effects: Side-effects or Adverse reactions: None reported Historical Monitoring: The patient  reports that she does not use drugs. List of all UDS Test(s): Lab Results  Component Value Date   COCAINSCRNUR NONE DETECTED 11/20/2013   THCU NONE DETECTED 11/20/2013   ETH <5 09/30/2016   ETH <11 11/19/2013   List of all Serum Drug Screening Test(s):  No results found for: AMPHSCRSER, BARBSCRSER, BENZOSCRSER, COCAINSCRSER, PCPSCRSER, PCPQUANT, THCSCRSER, CANNABQUANT, OPIATESCRSER, OXYSCRSER, PROPOXSCRSER Historical Background Evaluation: [place] PDMP: Six (6) year initial data search conducted.             [place] Department of public safety, offender search: Editor, commissioning Information) Non-contributory Risk Assessment Profile: Aberrant behavior: None observed or detected today Risk factors for fatal opioid overdose: None identified today Fatal overdose hazard ratio (HR): Calculation deferred Non-fatal overdose hazard ratio (HR): Calculation deferred Risk of opioid abuse or dependence: 0.7-3.0% with doses ? 36 MME/day and 6.1-26% with doses ? 120  MME/day. Substance use disorder (SUD) risk level: Pending results of Medical Psychology Evaluation for SUD Opioid risk tool (ORT) (Total Score):    ORT Scoring interpretation table:  Score <3 = Low Risk for SUD  Score between 4-7 = Moderate Risk for SUD  Score >8 = High Risk for Opioid Abuse   PHQ-2 Depression Scale:  Total score:    PHQ-2 Scoring interpretation table: (Score and probability of major depressive disorder)  Score 0 = No depression  Score 1 = 15.4% Probability  Score 2 = 21.1% Probability  Score 3 = 38.4% Probability  Score 4 = 45.5% Probability  Score 5 = 56.4% Probability  Score 6 = 78.6% Probability   PHQ-9 Depression Scale:  Total score:    PHQ-9 Scoring interpretation table:  Score 0-4 = No depression  Score 5-9 = Mild depression  Score 10-14 = Moderate depression  Score 15-19 = Moderately severe depression  Score 20-27 = Severe depression (2.4 times higher risk of SUD and 2.89 times higher risk of overuse)   Pharmacologic Plan: Pending ordered tests and/or consults  Meds  The patient has a current medication list which includes the following prescription(s): alprazolam, aspirin, butorphanol, cyclobenzaprine, escitalopram, hydrochlorothiazide, and zolpidem.  Current Outpatient Prescriptions on File Prior to Visit  Medication Sig  . ALPRAZolam (XANAX) 1 MG tablet Take 1 tablet (1 mg total) by mouth 3 (three) times daily as needed for anxiety.  Marland Kitchen aspirin (GOODSENSE ASPIRIN) 81 MG chewable tablet Chew by mouth.  . butorphanol (STADOL) 10 MG/ML nasal spray Place 1 spray into the nose every 4 (four) hours as needed for headache.  . cyclobenzaprine (FLEXERIL) 10 MG tablet Take 1 tablet (10 mg total) by mouth at bedtime.  Marland Kitchen escitalopram (LEXAPRO) 20 MG tablet Take 1 tablet (20 mg total) by mouth daily.  . hydrochlorothiazide (HYDRODIURIL) 12.5 MG tablet  Take 1 tablet (12.5 mg total) by mouth 2 (two) times daily.  Marland Kitchen zolpidem (AMBIEN) 10 MG tablet Take 1 tablet (10  mg total) by mouth at bedtime.   No current facility-administered medications on file prior to visit.    Imaging Review  Cervical Imaging: Cervical MR wo contrast: No results found for this or any previous visit. Cervical MR wo contrast: No results found for this or any previous visit. Cervical MR w/wo contrast: No results found for this or any previous visit. Cervical MR w contrast: No results found for this or any previous visit. Cervical CT wo contrast: No results found for this or any previous visit. Cervical CT w/wo contrast: No results found for this or any previous visit. Cervical CT w/wo contrast: No results found for this or any previous visit. Cervical CT w contrast: No results found for this or any previous visit. Cervical CT outside: No results found for this or any previous visit. Cervical DG 1 view: No results found for this or any previous visit. Cervical DG 2-3 views: No results found for this or any previous visit. Cervical DG F/E views: No results found for this or any previous visit. Cervical DG 2-3 clearing views: No results found for this or any previous visit. Cervical DG Bending/F/E views: No results found for this or any previous visit. Cervical DG complete: No results found for this or any previous visit. Cervical DG Myelogram views: No results found for this or any previous visit. Cervical DG Myelogram views: No results found for this or any previous visit. Cervical Discogram views: No results found for this or any previous visit.  Shoulder Imaging: Shoulder-R MR w contrast: No results found for this or any previous visit. Shoulder-L MR w contrast: No results found for this or any previous visit. Shoulder-R MR w/wo contrast: No results found for this or any previous visit. Shoulder-L MR w/wo contrast: No results found for this or any previous visit. Shoulder-R MR wo contrast: No results found for this or any previous visit. Shoulder-L MR wo contrast: No  results found for this or any previous visit. Shoulder-R CT w contrast: No results found for this or any previous visit. Shoulder-L CT w contrast: No results found for this or any previous visit. Shoulder-R CT w/wo contrast: No results found for this or any previous visit. Shoulder-L CT w/wo contrast: No results found for this or any previous visit. Shoulder-R CT wo contrast: No results found for this or any previous visit. Shoulder-L CT wo contrast: No results found for this or any previous visit. Shoulder-R DG Arthrogram: No results found for this or any previous visit. Shoulder-L DG Arthrogram: No results found for this or any previous visit. Shoulder-R DG 1 view: No results found for this or any previous visit. Shoulder-L DG 1 view: No results found for this or any previous visit. Shoulder-R DG:  Results for orders placed in visit on 10/01/14  DG Shoulder Right   Narrative **** PRIOR REPORT IMPORTED FROM AN EXTERNAL SYSTEM ****   CLINICAL DATA:  Slipped on floor today at home, right arm pain, fall   EXAM:  DG SHOULDER 3+ VIEWS RIGHT   COMPARISON:  None.   FINDINGS:  Three views of the right shoulder submitted. There is nondisplaced  fracture of the right humeral head.   IMPRESSION:  Nondisplaced fracture of the right humeral head.    Electronically Signed    By: Lahoma Crocker M.D.    On: 10/01/2014 22:06  Shoulder-L DG: No results found for this or any previous visit.  Thoracic Imaging: Thoracic MR wo contrast: No results found for this or any previous visit. Thoracic MR wo contrast: No results found for this or any previous visit. Thoracic MR w/wo contrast: No results found for this or any previous visit. Thoracic MR w contrast: No results found for this or any previous visit. Thoracic CT wo contrast: No results found for this or any previous visit. Thoracic CT w/wo contrast: No results found for this or any previous visit. Thoracic CT w/wo contrast: No results  found for this or any previous visit. Thoracic CT w contrast: No results found for this or any previous visit. Thoracic DG 2-3 views: No results found for this or any previous visit. Thoracic DG 4 views: No results found for this or any previous visit. Thoracic DG: No results found for this or any previous visit. Thoracic DG w/swimmers view: No results found for this or any previous visit. Thoracic DG Myelogram views: No results found for this or any previous visit. Thoracic DG Myelogram views: No results found for this or any previous visit.  Lumbosacral Imaging: Lumbar MR wo contrast: No results found for this or any previous visit. Lumbar MR wo contrast: No results found for this or any previous visit. Lumbar MR w/wo contrast: No results found for this or any previous visit. Lumbar MR w contrast: No results found for this or any previous visit. Lumbar CT wo contrast: No results found for this or any previous visit. Lumbar CT w/wo contrast: No results found for this or any previous visit. Lumbar CT w/wo contrast: No results found for this or any previous visit. Lumbar CT w contrast: No results found for this or any previous visit. Lumbar DG 1V: No results found for this or any previous visit. Lumbar DG 1V (Clearing): No results found for this or any previous visit. Lumbar DG 2-3V (Clearing): No results found for this or any previous visit. Lumbar DG 2-3 views: No results found for this or any previous visit. Lumbar DG (Complete) 4+V: No results found for this or any previous visit. Lumbar DG F/E views: No results found for this or any previous visit. Lumbar DG Bending views: No results found for this or any previous visit. Lumbar DG Myelogram views: No results found for this or any previous visit. Lumbar DG Myelogram: No results found for this or any previous visit. Lumbar DG Myelogram: No results found for this or any previous visit. Lumbar DG Myelogram: No results found for this or  any previous visit. Lumbar DG Myelogram Lumbosacral: No results found for this or any previous visit. Lumbar DG Diskogram views: No results found for this or any previous visit. Lumbar DG Diskogram views: No results found for this or any previous visit. Lumbar DG Epidurogram OP: No results found for this or any previous visit. Lumbar DG Epidurogram IP: No results found for this or any previous visit.  Sacroiliac Joint Imaging: Sacroiliac Joint DG: No results found for this or any previous visit. Sacroiliac Joint MR w/wo contrast: No results found for this or any previous visit. Sacroiliac Joint MR wo contrast: No results found for this or any previous visit.  Spine Imaging: Whole Spine DG Myelogram views: No results found for this or any previous visit. Whole Spine MR Mets screen: No results found for this or any previous visit. Whole Spine MR Mets screen: No results found for this or any previous visit. Whole Spine MR w/wo:  No results found for this or any previous visit. MRA Spinal Canal w/ cm: No results found for this or any previous visit. MRA Spinal Canal wo/ cm: No results found for this or any previous visit. MRA Spinal Canal w/wo cm: No results found for this or any previous visit. Spine Outside MR Films: No results found for this or any previous visit. Spine Outside CT Films: No results found for this or any previous visit. CT-Guided Biopsy: No results found for this or any previous visit. CT-Guided Needle Placement: No results found for this or any previous visit. DG Spine outside: No results found for this or any previous visit. IR Spine outside: No results found for this or any previous visit. NM Spine outside: No results found for this or any previous visit.  Hip Imaging: Hip-R MR w contrast: No results found for this or any previous visit. Hip-L MR w contrast: No results found for this or any previous visit. Hip-R MR w/wo contrast: No results found for this or any  previous visit. Hip-L MR w/wo contrast: No results found for this or any previous visit. Hip-R MR wo contrast: No results found for this or any previous visit. Hip-L MR wo contrast: No results found for this or any previous visit. Hip-R CT w contrast: No results found for this or any previous visit. Hip-L CT w contrast: No results found for this or any previous visit. Hip-R CT w/wo contrast: No results found for this or any previous visit. Hip-L CT w/wo contrast: No results found for this or any previous visit. Hip-R CT wo contrast: No results found for this or any previous visit. Hip-L CT wo contrast: No results found for this or any previous visit. Hip-R DG 2-3 views: No results found for this or any previous visit. Hip-L DG 2-3 views: No results found for this or any previous visit. Hip-R DG Arthrogram: No results found for this or any previous visit. Hip-L DG Arthrogram: No results found for this or any previous visit. Hip-B DG Bilateral: No results found for this or any previous visit.  Knee Imaging: Knee-R MR w contrast: No results found for this or any previous visit. Knee-L MR w contrast: No results found for this or any previous visit. Knee-R MR w/wo contrast: No results found for this or any previous visit. Knee-L MR w/wo contrast: No results found for this or any previous visit. Knee-R MR wo contrast: No results found for this or any previous visit. Knee-L MR wo contrast: No results found for this or any previous visit. Knee-R CT w contrast: No results found for this or any previous visit. Knee-L CT w contrast: No results found for this or any previous visit. Knee-R CT w/wo contrast: No results found for this or any previous visit. Knee-L CT w/wo contrast: No results found for this or any previous visit. Knee-R CT wo contrast: No results found for this or any previous visit. Knee-L CT wo contrast: No results found for this or any previous visit. Knee-R DG 1-2 views: No results  found for this or any previous visit. Knee-L DG 1-2 views: No results found for this or any previous visit. Knee-R DG 3 views: No results found for this or any previous visit. Knee-L DG 3 views: No results found for this or any previous visit. Knee-R DG 4 views:  Results for orders placed in visit on 10/01/14  DG Knee Complete 4 Views Right   Narrative **** PRIOR REPORT IMPORTED FROM AN EXTERNAL  SYSTEM ****   CLINICAL DATA:  Fall today, slipped on floor today right knee pain   EXAM:  RIGHT KNEE - COMPLETE 4+ VIEW   COMPARISON:  None.   FINDINGS:  Three views of the right knee submitted. There is diffuse  osteopenia. Prepatellar soft tissue swelling. Question nondisplaced  fracture of the lateral aspect of the patella.   IMPRESSION:  Diffuse osteopenia. Question nondisplaced fracture lateral aspect of  the patella.    Electronically Signed    By: Lahoma Crocker M.D.    On: 10/01/2014 22:08       Knee-L DG 4 views: No results found for this or any previous visit. Knee-R DG Arthrogram: No results found for this or any previous visit. Knee-L DG Arthrogram: No results found for this or any previous visit.  Note: Available results from prior imaging studies were reviewed.        ROS  Cardiovascular History: {Hx; Cardiovascular History:210120525} Pulmonary or Respiratory History: {Hx; Pumonary and/or Respiratory History:210120523} Neurological History: {Hx; Neurological:210120504} Review of Past Neurological Studies:  Results for orders placed or performed during the hospital encounter of 09/30/16  MR Brain Wo Contrast   Narrative   CLINICAL DATA:  Generalized weakness, 5/10 headache and syncopal episode at 1910 hours. LEFT facial droop, RIGHT leg weakness. History of migraine, lupus, breast cancer and hypertension.  EXAM: MRI HEAD WITHOUT CONTRAST  TECHNIQUE: Multiplanar, multiecho pulse sequences of the brain and surrounding structures were obtained without intravenous  contrast.  COMPARISON:  CT HEAD September 30, 2016 at 2121 hours an MRI head May 13, 2015  FINDINGS: BRAIN: No reduced diffusion to suggest acute ischemia. No susceptibility artifact to suggest hemorrhage. The ventricles and sulci are normal for patient's age. Patchy supratentorial white matter T2 hyperintensities compatible with mild chronic small vessel ischemic disease, relatively stable. No suspicious parenchymal signal, masses or mass effect. No abnormal extra-axial fluid collections. No extra-axial masses though, contrast enhanced sequences would be more sensitive.  VASCULAR: Normal major intracranial vascular flow voids present at skull base.  SKULL AND UPPER CERVICAL SPINE: No abnormal sellar expansion. No suspicious calvarial bone marrow signal. Craniocervical junction maintained.  SINUSES/ORBITS: The mastoid air-cells and included paranasal sinuses are well-aerated. The included ocular globes and orbital contents are non-suspicious.  OTHER: None.  IMPRESSION: No acute intracranial process.  Stable examination:  Negative MRI head for age.   Electronically Signed   By: Elon Alas M.D.   On: 09/30/2016 22:35   Results for orders placed or performed during the hospital encounter of 05/13/15  MR Brain W Wo Contrast   Narrative   [facility]  NEUROIMAGING REPORT   STUDY DATE: 05/13/15 PATIENT NAME: SAIYA CRIST DOB: 03-Oct-1946 MRN: [id number]  ORDERING CLINICIAN: Dennie Bible, NP CLINICAL HISTORY: 71 year old female with transient confusion.  EXAM: MRI brain (with and without)  TECHNIQUE: MRI of the brain with and without contrast was obtained  utilizing 5 mm axial slices with T1, T2, T2 flair, SWI and diffusion  weighted views.  T1 sagittal, T2 coronal and postcontrast views in the  axial and coronal plane were obtained. CONTRAST: 65m multihance  [facility] [address]1.5 Tesla MRI)     FINDINGS:  No abnormal lesions are seen on diffusion-weighted views to suggest acute  ischemia. The cortical sulci, fissures and cisterns are normal in size and  appearance. Lateral, third and fourth ventricle are normal in size and  appearance. No extra-axial fluid collections are  seen. No evidence of mass  effect or midline shift.    Scattered periventricular and subcortical foci of non-specific gliosis /  T2 hyperintensities.   No abnormal lesions are seen on post contrast views.    On sagittal views the posterior fossa, pituitary gland and corpus callosum  are unremarkable. No evidence of intracranial hemorrhage on SWI views. The  orbits and their contents, paranasal sinuses and calvarium are  unremarkable.  Intracranial flow voids are present.     Impression   Abnormal MRI brain (with and without) demonstrating: 1. Scattered periventricular and subcortical foci of non-specific gliosis  / T2 hyperintensities. No abnormal lesions are seen on post contrast  views. These findings are non-specific and considerations include  autoimmune, inflammatory, post-infectious, microvascular ischemic or  migraine associated etiologies.  2. No acute findings. 3. No change from MRI on 12/20/13.     INTERPRETING PHYSICIAN:  Penni Bombard, MD Certified in Neurology, Neurophysiology and Neuroimaging  [facility] [address], [place], [place] [address] [phone]   Results for orders placed or performed during the hospital encounter of 11/19/13  CT Head Wo Contrast   Narrative   CLINICAL DATA:  Headache. Right-sided facial droop. Right-sided weakness.  EXAM: CT HEAD WITHOUT CONTRAST  TECHNIQUE: Contiguous axial images were obtained from the base of the skull through the vertex without intravenous contrast.  COMPARISON:  None.  FINDINGS: There is no intra or extra-axial fluid collection or mass lesion. The basilar cisterns and ventricles  have a normal appearance. There is no CT evidence for acute infarction or hemorrhage. Bone windows are unremarkable.  IMPRESSION: No evidence for acute intracranial abnormality.  Critical Value/emergent results were called by telephone at the time of interpretation on 11/20/2013 at 12:06 AM to Dr. Teressa Lower , who verbally acknowledged these results.   Electronically Signed   By: Shon Hale M.D.   On: 11/20/2013 00:07    Psychological-Psychiatric History: {Hx; Psychological-Psychiatric History:210120512} Gastrointestinal History: {Hx; Gastrointestinal:210120527} Genitourinary History: {Hx; Genitourinary:210120506} Hematological History: {Hx; Hematological:210120510} Endocrine History: {Hx; Endocrine history:210120509} Rheumatologic History: {Hx; Rheumatological:210120530} Musculoskeletal History: {Hx; Musculoskeletal:210120528} Work History: {Hx; Work history:210120514}  Allergies  Donna Evans is allergic to ciprofloxacin; fluconazole; latex; morphine and related; codeine; demerol [meperidine]; duloxetine hcl; flagyl [metronidazole]; hydrocodone-acetaminophen; influenza vaccines; tape; amoxicillin-pot clavulanate; sulfamethoxazole-trimethoprim; and valacyclovir.  Laboratory Chemistry  Inflammation Markers No results found for: CRP, ESRSEDRATE (CRP: Acute Phase) (ESR: Chronic Phase) Renal Function Markers Lab Results  Component Value Date   BUN 16 10/01/2016   CREATININE 1.08 (H) 10/01/2016   GFRAA 59 (L) 10/01/2016   GFRNONAA 51 (L) 10/01/2016   Hepatic Function Markers Lab Results  Component Value Date   AST 26 10/01/2016   ALT 16 10/01/2016   ALBUMIN 3.7 10/01/2016   ALKPHOS 176 (H) 10/01/2016   Electrolytes Lab Results  Component Value Date   NA 138 10/01/2016   K 3.6 10/01/2016   CL 105 10/01/2016   CALCIUM 8.7 (L) 10/01/2016   MG 2.1 10/01/2016   Neuropathy Markers No results found for: [id number] Bone Pathology Markers Lab Results  Component Value  Date   ALKPHOS 176 (H) 10/01/2016   CALCIUM 8.7 (L) 10/01/2016   Coagulation Parameters Lab Results  Component Value Date   INR 0.93 09/30/2016   LABPROT 12.5 09/30/2016   APTT 31 09/30/2016   PLT 244 09/30/2016   Cardiovascular Markers Lab Results  Component Value Date   BNP 189 (H) 02/28/2012   HGB 14.3 09/30/2016   HCT 42.0  09/30/2016   Note: Lab results reviewed.  [place]  Drug: Donna Evans  reports that she does not use drugs. Alcohol:  reports that she drinks alcohol. Tobacco:  reports that she quit smoking about 11 years ago. Her smoking use included Cigarettes. She has a 40.00 pack-year smoking history. She has never used smokeless tobacco. 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 Family: family history includes Atrial fibrillation in her mother, sister, and sister; Breast cancer (age of onset: 11) in her sister; Cancer in her sister and sister; Diabetes in her father, sister, and sister; Kidney disease in her maternal aunt.  Past Surgical History:  Procedure Laterality Date  . ABDOMINAL HYSTERECTOMY  1981  . APPENDECTOMY  1957  . AUGMENTATION MAMMAPLASTY Bilateral 1990   saline submuscular  . BACK SURGERY  1989  . BILATERAL TOTAL MASTECTOMY WITH AXILLARY LYMPH NODE DISSECTION  1990  . BREAST IMPLANT EXCHANGE Bilateral 05/11/2016   Procedure: REMOVAL AND REPLACEMENT OF BREAST IMPLANTS ;  Surgeon: Irene Limbo, MD;  Location: [place];  Service: Plastics;  Laterality: Bilateral;  . CAPSULECTOMY Bilateral 05/11/2016   Procedure: CAPSULECTOMY  CAPSULORRAPHY ;  Surgeon: Irene Limbo, MD;  Location: [place];  Service: Plastics;  Laterality: Bilateral;  . CHOLECYSTECTOMY  1979  . LIPOSUCTION Right 05/11/2016   Procedure: LIPOSUCTION;  Surgeon: Irene Limbo, MD;  Location: [place];  Service: Plastics;  Laterality: Right;  . LIVER BIOPSY  2011  . MASTECTOMY SUBCUTANEOUS Bilateral 1990  . MASTOPEXY Bilateral 05/11/2016   Procedure: MASTOPEXY BILATERAL ;  Surgeon: Irene Limbo, MD;  Location: [place];  Service: Plastics;  Laterality: Bilateral;   Active Ambulatory Problems    Diagnosis Date Noted  . Transient cerebral ischemia 11/28/2013  . Adrenal adenoma 09/02/2015  . Chronic cystitis 09/02/2015  . Gross hematuria 07/28/2015  . Incomplete bladder emptying 09/02/2015  . Overactive detrusor 09/11/2015  . Acquired absence of both breasts and nipples 11/20/2014  . Hemiplegia of dominant side ([place]) 11/20/2013  . History of reconstruction of both breasts 06/18/2015  . Renal colic 16/38/4665  . Easy bruisability 11/13/2015  . Osteoporosis 11/13/2015  . Fibromyalgia syndrome 11/13/2015  . Generalized anxiety disorder 11/13/2015  . Chronic pain syndrome 11/13/2015  . Hypertension, benign 02/04/2016  . Controlled substance agreement signed 06/12/2016  . Insomnia 06/12/2016  . Migraine headache 08/10/2016  . Syncope 09/30/2016  . AKI (acute kidney injury) ([place]) 10/01/2016   Resolved Ambulatory Problems    Diagnosis Date Noted  . Acute confusional state 04/29/2015  . Urinary tract infection 07/28/2015  . Nausea and vomiting 07/28/2015  . UTI (lower urinary tract infection) 08/29/2015   Past Medical History:  Diagnosis Date  . Anxiety   . Breast cancer ([place]) 1990  . Chronic pain   . Edema   . Fibromyalgia   . Foot fracture   . GERD (gastroesophageal reflux disease)   . Hypertension   . Leg fracture, right   . Low back pain   . Lumbosacral neuritis   .  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 Migraine headache   . Peripheral neuropathy ([place])   . Systemic lupus erythematosus ([place])   . Thyroid disease   .  TIA (transient ischemic attack) 11/28/2013  . Transient global amnesia 2011   Constitutional Exam  General appearance: Well nourished, well developed, and well hydrated. In no apparent acute distress There were no vitals filed for this visit. BMI Assessment: Estimated body mass index is 26.18 kg/m as calculated from the following:   Height as of 10/22/16: 5' 6"  (1.676 m).   Weight as of 10/22/16: 162 lb 3.2 oz (73.6 kg).  BMI interpretation table: BMI level Category Range association with higher incidence of chronic pain  <18 kg/m2 Underweight   18.5-24.9 kg/m2 Ideal body weight   25-29.9 kg/m2 Overweight Increased incidence by 20%  30-34.9 kg/m2 Obese (Class I) Increased incidence by 68%  35-39.9 kg/m2 Severe obesity (Class II) Increased incidence by 136%  >40 kg/m2 Extreme obesity (Class III) Increased incidence by 254%   BMI Readings from Last 4 Encounters:  10/22/16 26.18 kg/m  10/01/16 25.97 kg/m  08/10/16 24.88 kg/m  07/02/16 24.92 kg/m   Wt Readings from Last 4 Encounters:  10/22/16 162 lb 3.2 oz (73.6 kg)  10/01/16 165 lb 12.8 oz (75.2 kg)  08/10/16 156 lb 8 oz (71 kg)  07/02/16 154 lb 6 oz (70 kg)  Psych/Mental status: Alert, oriented x 3 (person, place, & time)       Eyes: PERLA Respiratory: No evidence of acute respiratory distress  Cervical Spine Exam  Inspection: No masses, redness, or swelling Alignment: Symmetrical Functional ROM: Unrestricted ROM      Stability: No instability detected Muscle strength & Tone: Functionally intact Sensory: Unimpaired Palpation: No palpable anomalies              Upper Extremity (UE) Exam    Side: Right upper extremity  Side: Left upper extremity  Inspection: No masses, redness, swelling, or asymmetry. No contractures  Inspection: No masses, redness, swelling, or  asymmetry. No contractures  Functional ROM: Unrestricted ROM          Functional ROM: Unrestricted ROM          Muscle strength & Tone: Functionally intact  Muscle strength & Tone: Functionally intact  Sensory: Unimpaired  Sensory: Unimpaired  Palpation: No palpable anomalies              Palpation: No palpable anomalies              Specialized Test(s): Deferred         Specialized Test(s): Deferred          Thoracic Spine Exam  Inspection: No masses, redness, or swelling Alignment: Symmetrical Functional ROM: Unrestricted ROM Stability: No instability detected Sensory: Unimpaired Muscle strength & Tone: No palpable anomalies  Lumbar Spine Exam  Inspection: No masses, redness, or swelling Alignment: Symmetrical Functional ROM: Unrestricted ROM      Stability: No instability detected Muscle strength & Tone: Functionally intact Sensory: Unimpaired Palpation: No palpable anomalies       Provocative Tests: Lumbar Hyperextension and rotation test: evaluation deferred today       Patrick's Maneuver: evaluation deferred today                    Gait & Posture Assessment  Ambulation: Unassisted Gait: Relatively normal for age and body habitus Posture: WNL   Lower Extremity Exam    Side: Right lower extremity  Side: Left lower extremity  Inspection: No masses, redness, swelling, or asymmetry. No contractures  Inspection: No masses, redness, swelling, or asymmetry. No contractures  Functional ROM: Unrestricted ROM  Functional ROM: Unrestricted ROM          Muscle strength & Tone: Functionally intact  Muscle strength & Tone: Functionally intact  Sensory: Unimpaired  Sensory: Unimpaired  Palpation: No palpable anomalies  Palpation: No palpable anomalies   Assessment  Primary Diagnosis & Pertinent Problem List: There were no encounter diagnoses.  Visit Diagnosis: No diagnosis found. Plan of Care  Initial treatment plan:  Please be advised that as per protocol, today's  visit has been an evaluation only. We have not taken over the patient's controlled substance management.  Problem-specific plan: No problem-specific Assessment & Plan notes found for this encounter.  Ordered Lab-work, Procedure(s), Referral(s), & Consult(s): No orders of the defined types were placed in this encounter.  Pharmacotherapy: Medications ordered:  No orders of the defined types were placed in this encounter.  Medications administered during this visit: Donna Evans had no medications administered during this visit.   Pharmacotherapy under consideration:  Opioid Analgesics: The patient was informed that there is no guarantee that she would be a candidate for opioid analgesics. The decision will be made following CDC guidelines. This decision will be based on the results of diagnostic studies, as well as Donna Evans's risk profile.  Membrane stabilizer: To be determined at a later time Muscle relaxant: To be determined at a later time NSAID: To be determined at a later time Other analgesic(s): To be determined at a later time   Interventional therapies under consideration: Donna Evans was informed that there is no guarantee that she would be a candidate for interventional therapies. The decision will be based on the results of diagnostic studies, as well as Donna Evans's risk profile.  Possible procedure(s): ***   Provider-requested follow-up: No Follow-up on file.  Future Appointments Date Time Provider [place]  07/04/2017 1:00 PM Vevelyn Francois, NP [facility] None    Primary Care Physician: Ashok Norris, MD Location: [facility] Note by:  Date: 07/04/2017; Time: 9:01 AM  Pain Score Disclaimer: We use the NRS-11 scale. This is a self-reported, subjective measurement of pain severity with only modest accuracy. It is used primarily to identify changes within a particular patient. It must be understood that outpatient pain scales  are significantly less accurate that those used for research, where they can be applied under ideal controlled circumstances with minimal exposure to variables. In reality, the score is likely to be a combination of pain intensity and pain affect, where pain affect describes the degree of emotional arousal or changes in action readiness caused by the sensory experience of pain. Factors such as social and work situation, setting, emotional state, anxiety levels, expectation, and prior pain experience may influence pain perception and show large inter-individual differences that may also be affected by time variables.  Patient instructions provided during this appointment: There are no Patient Instructions on file for this visit.

## 2017-07-15 DIAGNOSIS — M25511 Pain in right shoulder: Secondary | ICD-10-CM | POA: Diagnosis not present

## 2017-07-15 DIAGNOSIS — M25512 Pain in left shoulder: Secondary | ICD-10-CM | POA: Diagnosis not present

## 2017-07-15 DIAGNOSIS — R262 Difficulty in walking, not elsewhere classified: Secondary | ICD-10-CM | POA: Diagnosis not present

## 2017-07-27 DIAGNOSIS — R262 Difficulty in walking, not elsewhere classified: Secondary | ICD-10-CM | POA: Diagnosis not present

## 2017-07-27 DIAGNOSIS — M25511 Pain in right shoulder: Secondary | ICD-10-CM | POA: Diagnosis not present

## 2017-07-27 DIAGNOSIS — M25512 Pain in left shoulder: Secondary | ICD-10-CM | POA: Diagnosis not present

## 2017-07-28 DIAGNOSIS — M7542 Impingement syndrome of left shoulder: Secondary | ICD-10-CM | POA: Diagnosis not present

## 2017-08-01 DIAGNOSIS — M25512 Pain in left shoulder: Secondary | ICD-10-CM | POA: Diagnosis not present

## 2017-08-01 DIAGNOSIS — R262 Difficulty in walking, not elsewhere classified: Secondary | ICD-10-CM | POA: Diagnosis not present

## 2017-08-01 DIAGNOSIS — M25511 Pain in right shoulder: Secondary | ICD-10-CM | POA: Diagnosis not present

## 2017-08-08 DIAGNOSIS — R262 Difficulty in walking, not elsewhere classified: Secondary | ICD-10-CM | POA: Diagnosis not present

## 2017-08-08 DIAGNOSIS — M25512 Pain in left shoulder: Secondary | ICD-10-CM | POA: Diagnosis not present

## 2017-08-08 DIAGNOSIS — M25511 Pain in right shoulder: Secondary | ICD-10-CM | POA: Diagnosis not present

## 2017-08-24 DIAGNOSIS — M7542 Impingement syndrome of left shoulder: Secondary | ICD-10-CM | POA: Diagnosis not present

## 2017-08-24 DIAGNOSIS — M542 Cervicalgia: Secondary | ICD-10-CM | POA: Diagnosis not present

## 2017-09-05 DIAGNOSIS — F411 Generalized anxiety disorder: Secondary | ICD-10-CM | POA: Diagnosis not present

## 2017-09-05 DIAGNOSIS — N189 Chronic kidney disease, unspecified: Secondary | ICD-10-CM | POA: Diagnosis not present

## 2017-09-05 DIAGNOSIS — M810 Age-related osteoporosis without current pathological fracture: Secondary | ICD-10-CM | POA: Diagnosis not present

## 2017-09-05 DIAGNOSIS — G4709 Other insomnia: Secondary | ICD-10-CM | POA: Diagnosis not present

## 2017-09-05 DIAGNOSIS — I10 Essential (primary) hypertension: Secondary | ICD-10-CM | POA: Diagnosis not present

## 2017-09-05 DIAGNOSIS — K219 Gastro-esophageal reflux disease without esophagitis: Secondary | ICD-10-CM | POA: Diagnosis not present

## 2017-09-05 DIAGNOSIS — M797 Fibromyalgia: Secondary | ICD-10-CM | POA: Diagnosis not present

## 2017-09-05 DIAGNOSIS — R32 Unspecified urinary incontinence: Secondary | ICD-10-CM | POA: Diagnosis not present

## 2017-09-05 DIAGNOSIS — F4320 Adjustment disorder, unspecified: Secondary | ICD-10-CM | POA: Diagnosis not present

## 2017-09-05 DIAGNOSIS — G8929 Other chronic pain: Secondary | ICD-10-CM | POA: Diagnosis not present

## 2017-09-19 DIAGNOSIS — M542 Cervicalgia: Secondary | ICD-10-CM | POA: Diagnosis not present

## 2017-09-21 DIAGNOSIS — M5412 Radiculopathy, cervical region: Secondary | ICD-10-CM | POA: Diagnosis not present

## 2017-11-02 DIAGNOSIS — R030 Elevated blood-pressure reading, without diagnosis of hypertension: Secondary | ICD-10-CM | POA: Diagnosis not present

## 2017-11-02 DIAGNOSIS — M4722 Other spondylosis with radiculopathy, cervical region: Secondary | ICD-10-CM | POA: Diagnosis not present

## 2017-11-08 DIAGNOSIS — R197 Diarrhea, unspecified: Secondary | ICD-10-CM | POA: Diagnosis not present

## 2017-11-08 DIAGNOSIS — N189 Chronic kidney disease, unspecified: Secondary | ICD-10-CM | POA: Diagnosis not present

## 2017-11-08 DIAGNOSIS — G4709 Other insomnia: Secondary | ICD-10-CM | POA: Diagnosis not present

## 2017-11-08 DIAGNOSIS — M810 Age-related osteoporosis without current pathological fracture: Secondary | ICD-10-CM | POA: Diagnosis not present

## 2017-11-08 DIAGNOSIS — H60502 Unspecified acute noninfective otitis externa, left ear: Secondary | ICD-10-CM | POA: Diagnosis not present

## 2017-11-08 DIAGNOSIS — G8929 Other chronic pain: Secondary | ICD-10-CM | POA: Diagnosis not present

## 2017-11-08 DIAGNOSIS — R32 Unspecified urinary incontinence: Secondary | ICD-10-CM | POA: Diagnosis not present

## 2017-11-08 DIAGNOSIS — M797 Fibromyalgia: Secondary | ICD-10-CM | POA: Diagnosis not present

## 2017-11-08 DIAGNOSIS — K219 Gastro-esophageal reflux disease without esophagitis: Secondary | ICD-10-CM | POA: Diagnosis not present

## 2017-11-08 DIAGNOSIS — Z86010 Personal history of colonic polyps: Secondary | ICD-10-CM | POA: Diagnosis not present

## 2017-11-08 DIAGNOSIS — I10 Essential (primary) hypertension: Secondary | ICD-10-CM | POA: Diagnosis not present

## 2017-11-08 DIAGNOSIS — F411 Generalized anxiety disorder: Secondary | ICD-10-CM | POA: Diagnosis not present

## 2017-11-08 DIAGNOSIS — F4320 Adjustment disorder, unspecified: Secondary | ICD-10-CM | POA: Diagnosis not present

## 2018-01-02 DIAGNOSIS — N189 Chronic kidney disease, unspecified: Secondary | ICD-10-CM | POA: Diagnosis not present

## 2018-01-02 DIAGNOSIS — J0100 Acute maxillary sinusitis, unspecified: Secondary | ICD-10-CM | POA: Diagnosis not present

## 2018-01-02 DIAGNOSIS — M810 Age-related osteoporosis without current pathological fracture: Secondary | ICD-10-CM | POA: Diagnosis not present

## 2018-01-02 DIAGNOSIS — F411 Generalized anxiety disorder: Secondary | ICD-10-CM | POA: Diagnosis not present

## 2018-01-02 DIAGNOSIS — R32 Unspecified urinary incontinence: Secondary | ICD-10-CM | POA: Diagnosis not present

## 2018-01-02 DIAGNOSIS — G8929 Other chronic pain: Secondary | ICD-10-CM | POA: Diagnosis not present

## 2018-01-02 DIAGNOSIS — G4709 Other insomnia: Secondary | ICD-10-CM | POA: Diagnosis not present

## 2018-01-02 DIAGNOSIS — F4320 Adjustment disorder, unspecified: Secondary | ICD-10-CM | POA: Diagnosis not present

## 2018-01-02 DIAGNOSIS — K219 Gastro-esophageal reflux disease without esophagitis: Secondary | ICD-10-CM | POA: Diagnosis not present

## 2018-01-02 DIAGNOSIS — M797 Fibromyalgia: Secondary | ICD-10-CM | POA: Diagnosis not present

## 2018-01-24 ENCOUNTER — Encounter: Payer: Self-pay | Admitting: *Deleted

## 2018-01-25 ENCOUNTER — Ambulatory Visit: Payer: PPO | Admitting: Anesthesiology

## 2018-01-25 ENCOUNTER — Other Ambulatory Visit: Payer: Self-pay

## 2018-01-25 ENCOUNTER — Encounter
Admission: RE | Disposition: A | Discharge status: Home / Self Care | Payer: Self-pay | Source: Ambulatory Visit | Attending: Unknown Physician Specialty

## 2018-01-25 ENCOUNTER — Encounter: Payer: Self-pay | Admitting: *Deleted

## 2018-01-25 ENCOUNTER — Ambulatory Visit
Admission: RE | Admit: 2018-01-25 | Discharge: 2018-01-25 | Disposition: A | Discharge status: Home / Self Care | Payer: PPO | Source: Ambulatory Visit | Attending: Unknown Physician Specialty | Admitting: Unknown Physician Specialty

## 2018-01-25 DIAGNOSIS — M329 Systemic lupus erythematosus, unspecified: Secondary | ICD-10-CM | POA: Insufficient documentation

## 2018-01-25 DIAGNOSIS — G629 Polyneuropathy, unspecified: Secondary | ICD-10-CM | POA: Insufficient documentation

## 2018-01-25 DIAGNOSIS — R197 Diarrhea, unspecified: Secondary | ICD-10-CM | POA: Diagnosis not present

## 2018-01-25 DIAGNOSIS — Z8673 Personal history of transient ischemic attack (TIA), and cerebral infarction without residual deficits: Secondary | ICD-10-CM | POA: Diagnosis not present

## 2018-01-25 DIAGNOSIS — I10 Essential (primary) hypertension: Secondary | ICD-10-CM | POA: Insufficient documentation

## 2018-01-25 DIAGNOSIS — Z1211 Encounter for screening for malignant neoplasm of colon: Secondary | ICD-10-CM | POA: Insufficient documentation

## 2018-01-25 DIAGNOSIS — Z87891 Personal history of nicotine dependence: Secondary | ICD-10-CM | POA: Insufficient documentation

## 2018-01-25 DIAGNOSIS — Z9013 Acquired absence of bilateral breasts and nipples: Secondary | ICD-10-CM | POA: Diagnosis not present

## 2018-01-25 DIAGNOSIS — Z7982 Long term (current) use of aspirin: Secondary | ICD-10-CM | POA: Diagnosis not present

## 2018-01-25 DIAGNOSIS — K640 First degree hemorrhoids: Secondary | ICD-10-CM | POA: Insufficient documentation

## 2018-01-25 DIAGNOSIS — E079 Disorder of thyroid, unspecified: Secondary | ICD-10-CM | POA: Diagnosis not present

## 2018-01-25 DIAGNOSIS — K635 Polyp of colon: Secondary | ICD-10-CM | POA: Diagnosis not present

## 2018-01-25 DIAGNOSIS — Z86010 Personal history of colonic polyps: Secondary | ICD-10-CM | POA: Diagnosis not present

## 2018-01-25 DIAGNOSIS — Z853 Personal history of malignant neoplasm of breast: Secondary | ICD-10-CM | POA: Diagnosis not present

## 2018-01-25 DIAGNOSIS — Z79899 Other long term (current) drug therapy: Secondary | ICD-10-CM | POA: Insufficient documentation

## 2018-01-25 DIAGNOSIS — F419 Anxiety disorder, unspecified: Secondary | ICD-10-CM | POA: Insufficient documentation

## 2018-01-25 HISTORY — DX: Fracture of right shoulder girdle, part unspecified, initial encounter for closed fracture: S42.91XA

## 2018-01-25 HISTORY — PX: COLONOSCOPY WITH PROPOFOL: SHX5780

## 2018-01-25 SURGERY — COLONOSCOPY WITH PROPOFOL
Anesthesia: General

## 2018-01-25 MED ORDER — MIDAZOLAM HCL 2 MG/2ML IJ SOLN
INTRAMUSCULAR | Status: DC | PRN
  Administered 2018-01-25: 1 mg via INTRAVENOUS

## 2018-01-25 MED ORDER — PHENYLEPHRINE HCL 10 MG/ML IJ SOLN
INTRAMUSCULAR | Status: DC | PRN
  Administered 2018-01-25: 100 ug via INTRAVENOUS
  Administered 2018-01-25: 99 ug via INTRAVENOUS

## 2018-01-25 MED ORDER — PROPOFOL 500 MG/50ML IV EMUL
INTRAVENOUS | Status: DC | PRN
  Administered 2018-01-25: 150 ug/kg/min via INTRAVENOUS

## 2018-01-25 MED ORDER — FENTANYL CITRATE (PF) 100 MCG/2ML IJ SOLN
INTRAMUSCULAR | Status: DC | PRN
  Administered 2018-01-25: 50 ug via INTRAVENOUS

## 2018-01-25 MED ORDER — PROPOFOL 10 MG/ML IV BOLUS
INTRAVENOUS | Status: DC | PRN
  Administered 2018-01-25 (×2): 30 mg via INTRAVENOUS

## 2018-01-25 MED ORDER — LIDOCAINE HCL (PF) 2 % IJ SOLN
INTRAMUSCULAR | Status: AC
  Filled 2018-01-25: qty 10

## 2018-01-25 MED ORDER — PROPOFOL 500 MG/50ML IV EMUL
INTRAVENOUS | Status: AC
  Filled 2018-01-25: qty 50

## 2018-01-25 MED ORDER — MIDAZOLAM HCL 2 MG/2ML IJ SOLN
INTRAMUSCULAR | Status: AC
  Filled 2018-01-25: qty 2

## 2018-01-25 MED ORDER — SODIUM CHLORIDE 0.9 % IV SOLN
INTRAVENOUS | Status: DC
  Administered 2018-01-25 (×2): via INTRAVENOUS

## 2018-01-25 MED ORDER — FENTANYL CITRATE (PF) 100 MCG/2ML IJ SOLN
INTRAMUSCULAR | Status: AC
  Filled 2018-01-25: qty 2

## 2018-01-25 NOTE — H&P (Signed)
Primary Care Physician:  Jodi Marble, MD Primary Gastroenterologist:  Dr. Vira Agar  Pre-Procedure History & Physical: HPI:  Donna Evans is a 72 y.o. female is here for an colonoscopy.   Past Medical History:  Diagnosis Date  . Anxiety   . Breast cancer ([place]) 1990   right breast cancer  . Chronic pain   . Edema   . Fibromyalgia   . Foot fracture    Bilateral  . GERD (gastroesophageal reflux disease)   . Hypertension   . Leg fracture, right   . Low back pain   . Lumbosacral neuritis   . 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 Migraine headache   . Peripheral neuropathy   . Shoulder fracture, right   . Systemic lupus erythematosus ([place])   . Thyroid disease   . TIA (transient ischemic attack) 11/28/2013  . Transient global amnesia 2011    Past Surgical History:  Procedure Laterality Date  . ABDOMINAL HYSTERECTOMY  1981  . APPENDECTOMY  1957  . AUGMENTATION MAMMAPLASTY Bilateral 1990   saline submuscular  . BACK SURGERY  1989  . BILATERAL TOTAL MASTECTOMY WITH AXILLARY LYMPH NODE DISSECTION  1990  . BREAST IMPLANT EXCHANGE Bilateral 05/11/2016   Procedure: REMOVAL AND REPLACEMENT OF BREAST IMPLANTS ;  Surgeon: Irene Limbo, MD;  Location: [place];  Service: Plastics;  Laterality: Bilateral;  . CAPSULECTOMY Bilateral 05/11/2016   Procedure: CAPSULECTOMY CAPSULORRAPHY ;  Surgeon: Irene Limbo, MD;  Location: [facility];  Service: Plastics;  Laterality: Bilateral;  . CHOLECYSTECTOMY  1979  . COPD    . LIPOSUCTION Right 05/11/2016   Procedure: LIPOSUCTION;  Surgeon: Irene Limbo, MD;  Location: [facility];  Service: Plastics;  Laterality: Right;  . LIVER BIOPSY  2011  . MASTECTOMY SUBCUTANEOUS Bilateral 1990  . MASTOPEXY Bilateral 05/11/2016   Procedure: MASTOPEXY BILATERAL ;  Surgeon: Irene Limbo, MD;  Location: [place];  Service: Plastics;  Laterality: Bilateral;    Prior to  Admission medications   Medication Sig Start Date End Date Taking? Authorizing Provider  ALPRAZolam Duanne Moron) 1 MG tablet Take 1 tablet (1 mg total) by mouth 3 (three) times daily as needed for anxiety. 10/22/16  Yes Kathrine Haddock, NP  aspirin (GOODSENSE ASPIRIN) 81 MG chewable tablet Chew by mouth.   Yes [provider]  butorphanol (STADOL) 10 MG/ML nasal spray Place 1 spray into the nose every 4 (four) hours as needed for headache. 10/22/16  Yes Kathrine Haddock, NP  cyclobenzaprine (FLEXERIL) 10 MG tablet Take 1 tablet (10 mg total) by mouth at bedtime. 10/22/16  Yes Kathrine Haddock, NP  hydrochlorothiazide (HYDRODIURIL) 12.5 MG tablet Take 1 tablet (12.5 mg total) by mouth 2 (two) times daily. 10/22/16  Yes Kathrine Haddock, NP  torsemide (DEMADEX) 20 MG tablet Take 20 mg by mouth daily.   Yes [provider]  zolpidem (AMBIEN) 10 MG tablet Take 1 tablet (10 mg total) by mouth at bedtime. 10/22/16  Yes Kathrine Haddock, NP  escitalopram (LEXAPRO) 20 MG tablet Take 1 tablet (20 mg total) by mouth daily. Patient not taking: Reported on 01/25/2018 10/22/16   Kathrine Haddock, NP  gabapentin (NEURONTIN) 100 MG capsule Take 100 mg by mouth 3 (three) times daily.    [provider]    Allergies as of 11/11/2017 - Review Complete 10/22/2016  Allergen Reaction Noted  . Ciprofloxacin Nausea And Vomiting 04/29/2015  . Fluconazole Hives 04/29/2015  . Latex Anaphylaxis and Rash 11/20/2013  .  Morphine and related Hives and Other (See Comments) 11/20/2013  . Codeine Hives and Other (See Comments) 11/20/2013  . Demerol [meperidine] Hives and Other (See Comments) 11/20/2013  . Duloxetine hcl Other (See Comments) 04/29/2015  . Flagyl [metronidazole] Other (See Comments) 04/29/2015  . Hydrocodone-acetaminophen Hives 04/29/2015  . Influenza vaccines Swelling 02/04/2016  . Tape  10/01/2016  . Amoxicillin-pot clavulanate Nausea And Vomiting 04/29/2015  . Sulfamethoxazole-trimethoprim  Nausea And Vomiting 04/29/2015  . Valacyclovir Rash 04/29/2015    Family History  Problem Relation Age of Onset  . Atrial fibrillation Mother   . Atrial fibrillation Sister   . Cancer Sister   . Diabetes Sister   . Breast cancer Sister 28  . Diabetes Father   . Cancer Sister   . Diabetes Sister   . Atrial fibrillation Sister   . Kidney disease Maternal Aunt     Social History   Socioeconomic History  . Marital status: Married    Spouse name: Not on file  . Number of children: 1  . Years of education: college3  . Highest education level: Not on file  Social Needs  . Financial resource strain: Not on file  . Food insecurity - worry: Not on file  . Food insecurity - inability: Not on file  . Transportation needs - medical: Not on file  . Transportation needs - non-medical: Not on file  Occupational History  . Occupation: Retired  Tobacco Use  . Smoking status: Former Smoker    Packs/day: 1.00    Years: 40.00    Pack years: 40.00    Types: Cigarettes    Last attempt to quit: 08/28/2005    Years since quitting: 12.4  . Smokeless tobacco: Never Used  . Tobacco comment: 9 years  Substance and Sexual Activity  . Alcohol use: Yes    Alcohol/week: 0.0 oz    Comment: rare 2 per month  . Drug use: No  . Sexual activity: No  Other Topics Concern  . Not on file  Social History Narrative  . Not on file    Review of Systems: See HPI, otherwise negative ROS  Physical Exam: Pulse 85   Temp 97.6 F (36.4 C) (Tympanic)   Resp 15   Ht 5\' 7"  (1.702 m)   Wt 59.9 kg (132 lb)   SpO2 100%   BMI 20.67 kg/m  General:   Alert,  pleasant and cooperative in NAD Head:  Normocephalic and atraumatic. Neck:  Supple; no masses or thyromegaly. Lungs:  Clear throughout to auscultation.    Heart:  Regular rate and rhythm. Abdomen:  Soft, nontender and nondistended. Normal bowel sounds, without guarding, and without rebound.   Neurologic:  Alert and  oriented x4;  grossly normal  neurologically.  Impression/Plan: Donna Evans is here for an colonoscopy to be performed for [facility] colon polyps.  Risks, benefits, limitations, and alternatives regarding  colonoscopy have been reviewed with the patient.  Questions have been answered.  All parties agreeable.   Gaylyn Cheers, MD  01/25/2018, 11:06 AM

## 2018-01-25 NOTE — Transfer of Care (Signed)
Immediate Anesthesia Transfer of Care Note  Patient: Donna Evans  Procedure(s) Performed: COLONOSCOPY WITH PROPOFOL (N/A )  Patient Location: PACU  Anesthesia Type:General  Level of Consciousness: awake  Airway & Oxygen Therapy: Patient Spontanous Breathing and Patient connected to nasal cannula oxygen  Post-op Assessment: Report given to RN and Post -op Vital signs reviewed and stable  Post vital signs: Reviewed and stable  Last Vitals:  Vitals:   01/25/18 1030 01/25/18 1136  BP:  (!) 91/39  Pulse: 85 65  Resp: 15 16  Temp: 36.4 C   SpO2: 100% 100%    Last Pain:  Vitals:   01/25/18 1136  TempSrc: Tympanic      Patients Stated Pain Goal: 5 (70/14/10 3013)  Complications: No apparent anesthesia complications

## 2018-01-25 NOTE — Anesthesia Postprocedure Evaluation (Signed)
Anesthesia Post Note  Patient: Donna Evans  Procedure(s) Performed: COLONOSCOPY WITH PROPOFOL (N/A )  Patient location during evaluation: Endoscopy Anesthesia Type: General Level of consciousness: awake and alert Pain management: pain level controlled Vital Signs Assessment: post-procedure vital signs reviewed and stable Respiratory status: spontaneous breathing, nonlabored ventilation, respiratory function stable and patient connected to nasal cannula oxygen Cardiovascular status: blood pressure returned to baseline and stable Postop Assessment: no apparent nausea or vomiting Anesthetic complications: no     Last Vitals:  Vitals:   01/25/18 1136 01/25/18 1146  BP: (!) 91/39 (!) 101/53  Pulse: 65 61  Resp: 16 15  Temp:    SpO2: 100% 100%    Last Pain:  Vitals:   01/25/18 1136  TempSrc: Tympanic                 Donna Evans

## 2018-01-25 NOTE — Anesthesia Post-op Follow-up Note (Signed)
Anesthesia QCDR form completed.        

## 2018-01-25 NOTE — Op Note (Addendum)
[facility] Gastroenterology Patient Name: Donna Evans Procedure Date: 01/25/2018 11:03 AM MRN: [id number] Account #: [id number] Date of Birth: Sep 07, 1946 Admit Type: Outpatient Age: 72 Room: [facility] ENDO ROOM 3 Gender: Female Note Status: Finalized Procedure:            Colonoscopy Indications:          High risk colon cancer surveillance: Personal history                        of colonic polyps Providers:            Manya Silvas, MD Referring MD:         Venetia Maxon. Elijio Miles, MD (Referring MD) Medicines:            Propofol per Anesthesia Complications:        No immediate complications. Procedure:            Pre-Anesthesia Assessment:                       - After reviewing the risks and benefits, the patient                        was deemed in satisfactory condition to undergo the                        procedure.                       After obtaining informed consent, the colonoscope was                        passed under direct vision. Throughout the procedure,                        the patient's blood pressure, pulse, and oxygen                        saturations were monitored continuously. The                        Colonoscope was introduced through the anus and                        advanced to the the cecum, identified by appendiceal                        orifice and ileocecal valve. The colonoscopy was                        performed without difficulty. The patient tolerated the                        procedure well. The quality of the bowel preparation                        was excellent. Findings:      A diminutive polyp was found in the cecum. The polyp was sessile. The       polyp was removed with a jumbo cold forceps. Resection and retrieval       were complete.      Normal mucosa was found in  the descending colon. Biopsies were taken       with a cold forceps for histology.      Internal hemorrhoids were found during endoscopy. The  hemorrhoids were       small and Grade I (internal hemorrhoids that do not prolapse).      The exam was otherwise without abnormality. Impression:           - One diminutive polyp in the cecum, removed with a                        jumbo cold forceps. Resected and retrieved.                       - Normal mucosa in the descending colon. Biopsied.                       - Internal hemorrhoids.                       - The examination was otherwise normal. Recommendation:       - Await pathology results. Take colestid three times a                        day. Manya Silvas, MD 01/25/2018 11:31:40 AM This report has been signed electronically. Number of Addenda: 0 Note Initiated On: 01/25/2018 11:03 AM Scope Withdrawal Time: 0 hours 7 minutes 31 seconds  Total Procedure Duration: 0 hours 15 minutes 52 seconds       [facility]

## 2018-01-25 NOTE — Anesthesia Procedure Notes (Signed)
Date/Time: 01/25/2018 11:07 AM Performed by: Allean Found, CRNA Pre-anesthesia Checklist: Patient identified, Emergency Drugs available, Suction available, Patient being monitored and Timeout performed Patient Re-evaluated:Patient Re-evaluated prior to induction Oxygen Delivery Method: Nasal cannula Placement Confirmation: positive ETCO2 Dental Injury: Teeth and Oropharynx as per pre-operative assessment

## 2018-01-25 NOTE — Anesthesia Preprocedure Evaluation (Signed)
Anesthesia Evaluation  Patient identified by MRN, date of birth, ID band Patient awake    Reviewed: Allergy & Precautions, H&P , NPO status , reviewed documented beta blocker date and time   Airway Mallampati: II       Dental  (+) Caps, Dental Advidsory Given   Pulmonary former smoker,    Pulmonary exam normal        Cardiovascular hypertension, Normal cardiovascular exam     Neuro/Psych  Headaches, TIA Neuromuscular disease    GI/Hepatic GERD  ,  Endo/Other    Renal/GU Renal disease     Musculoskeletal   Abdominal   Peds  Hematology   Anesthesia Other Findings   Reproductive/Obstetrics                             Anesthesia Physical Anesthesia Plan  ASA: III  Anesthesia Plan: General   Post-op Pain Management:    Induction:   PONV Risk Score and Plan: 3 and Propofol infusion  Airway Management Planned:   Additional Equipment:   Intra-op Plan:   Post-operative Plan:   Informed Consent: I have reviewed the patients History and Physical, chart, labs and discussed the procedure including the risks, benefits and alternatives for the proposed anesthesia with the patient or authorized representative who has indicated his/her understanding and acceptance.   Dental Advisory Given  Plan Discussed with: CRNA  Anesthesia Plan Comments:         Anesthesia Quick Evaluation

## 2018-01-26 ENCOUNTER — Encounter: Payer: Self-pay | Admitting: Unknown Physician Specialty

## 2018-01-27 LAB — SURGICAL PATHOLOGY

## 2018-03-06 ENCOUNTER — Encounter: Payer: Self-pay | Admitting: *Deleted

## 2018-03-07 ENCOUNTER — Ambulatory Visit
Admission: RE | Admit: 2018-03-07 | Discharge: 2018-03-07 | Disposition: A | Discharge status: Home / Self Care | Payer: PPO | Source: Ambulatory Visit | Attending: Unknown Physician Specialty | Admitting: Unknown Physician Specialty

## 2018-03-07 ENCOUNTER — Ambulatory Visit: Payer: PPO | Admitting: Certified Registered"

## 2018-03-07 ENCOUNTER — Encounter: Payer: Self-pay | Admitting: *Deleted

## 2018-03-07 ENCOUNTER — Encounter
Admission: RE | Disposition: A | Discharge status: Home / Self Care | Payer: Self-pay | Source: Ambulatory Visit | Attending: Unknown Physician Specialty

## 2018-03-07 DIAGNOSIS — M329 Systemic lupus erythematosus, unspecified: Secondary | ICD-10-CM | POA: Insufficient documentation

## 2018-03-07 DIAGNOSIS — Z7982 Long term (current) use of aspirin: Secondary | ICD-10-CM | POA: Diagnosis not present

## 2018-03-07 DIAGNOSIS — R634 Abnormal weight loss: Secondary | ICD-10-CM | POA: Diagnosis not present

## 2018-03-07 DIAGNOSIS — K319 Disease of stomach and duodenum, unspecified: Secondary | ICD-10-CM | POA: Insufficient documentation

## 2018-03-07 DIAGNOSIS — Z87891 Personal history of nicotine dependence: Secondary | ICD-10-CM | POA: Diagnosis not present

## 2018-03-07 DIAGNOSIS — M797 Fibromyalgia: Secondary | ICD-10-CM | POA: Insufficient documentation

## 2018-03-07 DIAGNOSIS — Z9049 Acquired absence of other specified parts of digestive tract: Secondary | ICD-10-CM | POA: Insufficient documentation

## 2018-03-07 DIAGNOSIS — J449 Chronic obstructive pulmonary disease, unspecified: Secondary | ICD-10-CM | POA: Insufficient documentation

## 2018-03-07 DIAGNOSIS — G629 Polyneuropathy, unspecified: Secondary | ICD-10-CM | POA: Diagnosis not present

## 2018-03-07 DIAGNOSIS — K3189 Other diseases of stomach and duodenum: Secondary | ICD-10-CM | POA: Insufficient documentation

## 2018-03-07 DIAGNOSIS — K2970 Gastritis, unspecified, without bleeding: Secondary | ICD-10-CM | POA: Diagnosis not present

## 2018-03-07 DIAGNOSIS — K209 Esophagitis, unspecified: Secondary | ICD-10-CM | POA: Diagnosis not present

## 2018-03-07 DIAGNOSIS — R1013 Epigastric pain: Secondary | ICD-10-CM | POA: Insufficient documentation

## 2018-03-07 DIAGNOSIS — I10 Essential (primary) hypertension: Secondary | ICD-10-CM | POA: Diagnosis not present

## 2018-03-07 DIAGNOSIS — K2950 Unspecified chronic gastritis without bleeding: Secondary | ICD-10-CM | POA: Diagnosis not present

## 2018-03-07 DIAGNOSIS — K2960 Other gastritis without bleeding: Secondary | ICD-10-CM | POA: Diagnosis not present

## 2018-03-07 DIAGNOSIS — K210 Gastro-esophageal reflux disease with esophagitis: Secondary | ICD-10-CM | POA: Diagnosis not present

## 2018-03-07 DIAGNOSIS — F419 Anxiety disorder, unspecified: Secondary | ICD-10-CM | POA: Insufficient documentation

## 2018-03-07 DIAGNOSIS — K449 Diaphragmatic hernia without obstruction or gangrene: Secondary | ICD-10-CM | POA: Diagnosis not present

## 2018-03-07 DIAGNOSIS — Z8673 Personal history of transient ischemic attack (TIA), and cerebral infarction without residual deficits: Secondary | ICD-10-CM | POA: Insufficient documentation

## 2018-03-07 DIAGNOSIS — Z79899 Other long term (current) drug therapy: Secondary | ICD-10-CM | POA: Insufficient documentation

## 2018-03-07 DIAGNOSIS — Z853 Personal history of malignant neoplasm of breast: Secondary | ICD-10-CM | POA: Insufficient documentation

## 2018-03-07 HISTORY — PX: ESOPHAGOGASTRODUODENOSCOPY (EGD) WITH PROPOFOL: SHX5813

## 2018-03-07 HISTORY — DX: Angina pectoris, unspecified: I20.9

## 2018-03-07 HISTORY — DX: Unspecified osteoarthritis, unspecified site: M19.90

## 2018-03-07 HISTORY — DX: Chronic obstructive pulmonary disease, unspecified: J44.9

## 2018-03-07 HISTORY — DX: Allergy, unspecified, initial encounter: T78.40XA

## 2018-03-07 SURGERY — ESOPHAGOGASTRODUODENOSCOPY (EGD) WITH PROPOFOL
Anesthesia: General

## 2018-03-07 MED ORDER — FENTANYL CITRATE (PF) 100 MCG/2ML IJ SOLN
INTRAMUSCULAR | Status: AC
Start: 2018-03-07 — End: 2018-03-07
  Filled 2018-03-07: qty 2

## 2018-03-07 MED ORDER — LIDOCAINE HCL (CARDIAC) 20 MG/ML IV SOLN
INTRAVENOUS | Status: DC | PRN
  Administered 2018-03-07: 60 mg via INTRAVENOUS

## 2018-03-07 MED ORDER — FENTANYL CITRATE (PF) 100 MCG/2ML IJ SOLN
INTRAMUSCULAR | Status: DC | PRN
  Administered 2018-03-07: 50 ug via INTRAVENOUS
  Administered 2018-03-07 (×2): 25 ug via INTRAVENOUS

## 2018-03-07 MED ORDER — SODIUM CHLORIDE 0.9 % IV SOLN
INTRAVENOUS | Status: DC
  Administered 2018-03-07: 13:00:00 via INTRAVENOUS

## 2018-03-07 MED ORDER — PROPOFOL 10 MG/ML IV BOLUS
INTRAVENOUS | Status: DC | PRN
  Administered 2018-03-07: 80 mg via INTRAVENOUS
  Administered 2018-03-07: 20 mg via INTRAVENOUS
  Administered 2018-03-07: 50 mg via INTRAVENOUS

## 2018-03-07 MED ORDER — PROPOFOL 10 MG/ML IV BOLUS
INTRAVENOUS | Status: AC
  Filled 2018-03-07: qty 40

## 2018-03-07 NOTE — Op Note (Signed)
[facility] Gastroenterology Patient Name: Donna Evans Procedure Date: 03/07/2018 1:14 PM MRN: [id number] Account #: [id number] Date of Birth: 06-10-1946 Admit Type: Outpatient Age: 72 Room: [facility] ENDO ROOM 1 Gender: Female Note Status: Finalized Procedure:            Upper GI endoscopy Indications:          Epigastric abdominal pain, Weight loss Providers:            Manya Silvas, MD Referring MD:         Venetia Maxon. Elijio Miles, MD (Referring MD) Complications:        No immediate complications. Procedure:            Pre-Anesthesia Assessment:                       - After reviewing the risks and benefits, the patient                        was deemed in satisfactory condition to undergo the                        procedure.                       After obtaining informed consent, the endoscope was                        passed under direct vision. Throughout the procedure,                        the patient's blood pressure, pulse, and oxygen                        saturations were monitored continuously. The Endoscope                        was introduced through the mouth, and advanced to the                        second part of duodenum. The upper GI endoscopy was                        accomplished without difficulty. The patient tolerated                        the procedure well. Findings:      Hiatal hernia seen at junction of esophagus and stomach. Esophagus above       this was normal.      LA Grade B (one or more mucosal breaks greater than 5 mm, not extending       between the tops of two mucosal folds) esophagitis with no bleeding was       found 39 cm from the incisors. Biopsies were taken with a cold forceps       for histology.      Diffuse and patchy mild inflammation characterized by erythema and       granularity was found in the gastric body and in the gastric antrum.       Biopsies were taken with a cold forceps for histology. Biopsies  were       taken with a cold forceps for  Helicobacter pylori testing.      The duodenal bulb, second portion of the duodenum and examined duodenum       were normal. Biopsies were taken with a cold forceps for histology.       Biopsies for histology were taken with a cold forceps for evaluation of       celiac disease. Impression:           - LA Grade B reflux esophagitis. Rule out Barrett's                        esophagus. Biopsied.                       - Gastritis. Biopsied.                       - Normal duodenal bulb, second portion of the duodenum                        and examined duodenum. Biopsied. Recommendation:       - Await pathology results. Manya Silvas, MD 03/07/2018 1:43:23 PM This report has been signed electronically. Number of Addenda: 0 Note Initiated On: 03/07/2018 1:14 PM      [facility]

## 2018-03-07 NOTE — Anesthesia Post-op Follow-up Note (Signed)
Anesthesia QCDR form completed.        

## 2018-03-07 NOTE — Anesthesia Preprocedure Evaluation (Signed)
Anesthesia Evaluation  Patient identified by MRN, date of birth, ID band Patient awake    Reviewed: Allergy & Precautions, H&P , NPO status , Patient's Chart, lab work & pertinent test results, reviewed documented beta blocker date and time   Airway Mallampati: II   Neck ROM: full    Dental  (+) Poor Dentition   Pulmonary neg pulmonary ROS, COPD, former smoker,    Pulmonary exam normal        Cardiovascular Exercise Tolerance: Good hypertension, On Medications + angina with exertion negative cardio ROS Normal cardiovascular exam Rhythm:regular Rate:Normal     Neuro/Psych  Headaches, PSYCHIATRIC DISORDERS Anxiety TIA Neuromuscular disease negative neurological ROS  negative psych ROS   GI/Hepatic negative GI ROS, Neg liver ROS, GERD  ,  Endo/Other  negative endocrine ROS  Renal/GU Renal diseasenegative Renal ROS  negative genitourinary   Musculoskeletal   Abdominal   Peds  Hematology negative hematology ROS (+)   Anesthesia Other Findings Past Medical History: No date: Allergic state No date: Anginal pain ([place])     Comment:  Prinzmetal's angina No date: Anxiety No date: Arthritis     Comment:  osteoarthritis 1990: Breast cancer ([place])     Comment:  right breast cancer No date: Chronic pain No date: Chronic pain No date: COPD (chronic obstructive pulmonary disease) (HCC) No date: Edema No date: Fibromyalgia No date: Foot fracture     Comment:  Bilateral No date: GERD (gastroesophageal reflux disease) No date: Hypertension No date: Leg fracture, right No date: Low back pain No date: Lumbosacral neuritis No date: Medulloadrenal hyperfunction (HCC) No date: Migraine headache No date: Peripheral neuropathy No date: Shoulder fracture, right No date: Systemic lupus erythematosus ([place]) No date: Thyroid disease 11/28/2013: TIA (transient ischemic attack) 2011: Transient global amnesia Past Surgical  History: 1981: ABDOMINAL HYSTERECTOMY 1957: APPENDECTOMY 1990: AUGMENTATION MAMMAPLASTY; Bilateral     Comment:  saline submuscular 1989: BACK SURGERY 1990: BILATERAL TOTAL MASTECTOMY WITH AXILLARY LYMPH NODE DISSECTION 05/11/2016: BREAST IMPLANT EXCHANGE; Bilateral     Comment:  Procedure: REMOVAL AND REPLACEMENT OF BREAST IMPLANTS ;               Surgeon: Irene Limbo, MD;  Location: [place];  Service: Plastics;  Laterality:               Bilateral; 05/11/2016: CAPSULECTOMY; Bilateral     Comment:  Procedure: CAPSULECTOMY CAPSULORRAPHY ;  Surgeon: Irene Limbo, MD;  Location: [place];                Service: Plastics;  Laterality: Bilateral; 1979: CHOLECYSTECTOMY 01/25/2018: COLONOSCOPY WITH PROPOFOL; N/A     Comment:  Procedure: COLONOSCOPY WITH PROPOFOL;  Surgeon: Manya Silvas, MD;  Location: [facility] ENDOSCOPY;  Service:               Endoscopy;  Laterality: N/A; No date: COPD 05/11/2016: LIPOSUCTION; Right     Comment:  Procedure: LIPOSUCTION;  Surgeon: Irene Limbo, MD;               Location: [place];  Service: Plastics;               Laterality: Right; 2011: LIVER BIOPSY 1990: MASTECTOMY SUBCUTANEOUS; Bilateral 05/11/2016: MASTOPEXY; Bilateral  Comment:  Procedure: MASTOPEXY BILATERAL ;  Surgeon: Irene Limbo, MD;  Location: [place];                Service: Plastics;  Laterality: Bilateral;   Reproductive/Obstetrics negative OB ROS                             Anesthesia Physical Anesthesia Plan  ASA: III  Anesthesia Plan: General   Post-op Pain Management:    Induction:   PONV Risk Score and Plan:   Airway Management Planned:   Additional Equipment:   Intra-op Plan:   Post-operative Plan:   Informed Consent: I have reviewed the patients History and Physical, chart, labs and discussed the  procedure including the risks, benefits and alternatives for the proposed anesthesia with the patient or authorized representative who has indicated his/her understanding and acceptance.   Dental Advisory Given  Plan Discussed with: CRNA  Anesthesia Plan Comments:         Anesthesia Quick Evaluation

## 2018-03-07 NOTE — Transfer of Care (Signed)
Immediate Anesthesia Transfer of Care Note  Patient: Donna Evans  Procedure(s) Performed: ESOPHAGOGASTRODUODENOSCOPY (EGD) WITH PROPOFOL (N/A )  Patient Location: PACU  Anesthesia Type:General  Level of Consciousness: awake, alert  and oriented  Airway & Oxygen Therapy: Patient Spontanous Breathing and Patient connected to nasal cannula oxygen  Post-op Assessment: Report given to RN and Post -op Vital signs reviewed and stable  Post vital signs: Reviewed and stable  Last Vitals:  Vitals Value Taken Time  BP    Temp    Pulse    Resp    SpO2      Last Pain:  Vitals:   03/07/18 1303  TempSrc: Tympanic  PainSc: 0-No pain         Complications: No apparent anesthesia complications

## 2018-03-07 NOTE — H&P (Signed)
Primary Care Physician:  Jodi Marble, MD Primary Gastroenterologist:  Dr. Vira Agar  Pre-Procedure History & Physical: HPI:  Donna Evans is a 72 y.o. female is here for an endoscopy.  Patient complains of epigastric abd pain.   Past Medical History:  Diagnosis Date  . Allergic state   . Anginal pain ([place])    Prinzmetal's angina  . Anxiety   . Arthritis    osteoarthritis  . Breast cancer ([place]) 1990   right breast cancer  . Chronic pain   . Chronic pain   . COPD (chronic obstructive pulmonary disease) ([place])   . Edema   . Fibromyalgia   . Foot fracture    Bilateral  . GERD (gastroesophageal reflux disease)   . Hypertension   . Leg fracture, right   . Low back pain   . Lumbosacral neuritis   . Medulloadrenal hyperfunction ([place])   . Migraine headache   . Peripheral neuropathy   . Shoulder fracture, right   . Systemic lupus erythematosus ([place])   . Thyroid disease   . TIA (transient ischemic attack) 11/28/2013  . Transient global amnesia 2011    Past Surgical History:  Procedure Laterality Date  . ABDOMINAL HYSTERECTOMY  1981  . APPENDECTOMY  1957  . AUGMENTATION MAMMAPLASTY Bilateral 1990   saline submuscular  . BACK SURGERY  1989  . BILATERAL TOTAL MASTECTOMY WITH AXILLARY LYMPH NODE DISSECTION  1990  . BREAST IMPLANT EXCHANGE Bilateral 05/11/2016   Procedure: REMOVAL AND REPLACEMENT OF BREAST IMPLANTS ;  Surgeon: Irene Limbo, MD;  Location: [place];  Service: Plastics;  Laterality: Bilateral;  . CAPSULECTOMY Bilateral 05/11/2016   Procedure: CAPSULECTOMY CAPSULORRAPHY ;  Surgeon: Irene Limbo, MD;  Location: [place];  Service: Plastics;  Laterality: Bilateral;  . CHOLECYSTECTOMY  1979  . COLONOSCOPY WITH PROPOFOL N/A 01/25/2018   Procedure: COLONOSCOPY WITH PROPOFOL;  Surgeon: Manya Silvas, MD;  Location: [facility] ENDOSCOPY;  Service: Endoscopy;  Laterality: N/A;  . COPD    . LIPOSUCTION Right 05/11/2016   Procedure: LIPOSUCTION;  Surgeon: Irene Limbo, MD;  Location: [place];  Service: Plastics;  Laterality: Right;  . LIVER BIOPSY  2011  . MASTECTOMY SUBCUTANEOUS Bilateral 1990  . MASTOPEXY Bilateral 05/11/2016   Procedure: MASTOPEXY BILATERAL ;  Surgeon: Irene Limbo, MD;  Location: [place];  Service: Plastics;  Laterality: Bilateral;    Prior to Admission medications   Medication Sig Start Date End Date Taking? Authorizing Provider  ALPRAZolam Duanne Moron) 1 MG tablet Take 1 tablet (1 mg total) by mouth 3 (three) times daily as needed for anxiety. 10/22/16  Yes Kathrine Haddock, NP  butorphanol (STADOL) 10 MG/ML nasal spray Place 1 spray into the nose every 4 (four) hours as needed for headache. 10/22/16  Yes Kathrine Haddock, NP  cyclobenzaprine (FLEXERIL) 10 MG tablet Take 1 tablet (10 mg total) by mouth at bedtime. 10/22/16  Yes Kathrine Haddock, NP  zolpidem (AMBIEN) 10 MG tablet Take 1 tablet (10 mg total) by mouth at bedtime. 10/22/16  Yes Kathrine Haddock, NP  aspirin (GOODSENSE ASPIRIN) 81 MG chewable tablet Chew by mouth.    [provider]  escitalopram (LEXAPRO) 20 MG tablet Take 1 tablet (20 mg total) by mouth daily. Patient not taking: Reported on 01/25/2018 10/22/16   Kathrine Haddock, NP  gabapentin (NEURONTIN) 100 MG capsule Take 100 mg by mouth 3 (three) times daily.    [provider]  hydrochlorothiazide (HYDRODIURIL) 12.5 MG tablet  Take 1 tablet (12.5 mg total) by mouth 2 (two) times daily. Patient not taking: Reported on 03/07/2018 10/22/16   Kathrine Haddock, NP  torsemide (DEMADEX) 20 MG tablet Take 20 mg by mouth daily.    [provider]    Allergies as of 03/06/2018 - Review Complete 03/06/2018  Allergen Reaction Noted  . Ciprofloxacin Nausea And Vomiting 04/29/2015  . Codeine Anaphylaxis and Hives 11/20/2013  . Demerol [meperidine] Anaphylaxis and Hives 11/20/2013  . Fluconazole Hives 04/29/2015  . Latex  Anaphylaxis and Rash 11/20/2013  . Morphine and related Hives and Other (See Comments) 11/20/2013  . Duloxetine hcl Other (See Comments) 04/29/2015  . Flagyl [metronidazole] Nausea And Vomiting 04/29/2015  . Hydrocodone-acetaminophen Hives 04/29/2015  . Influenza vaccines Swelling 02/04/2016  . Tape  10/01/2016  . Valtrex [valacyclovir hcl]  03/06/2018  . Amoxicillin-pot clavulanate Nausea And Vomiting 04/29/2015  . Sulfamethoxazole-trimethoprim Nausea And Vomiting 04/29/2015  . Valacyclovir Rash 04/29/2015    Family History  Problem Relation Age of Onset  . Atrial fibrillation Mother   . Atrial fibrillation Sister   . Cancer Sister   . Diabetes Sister   . Breast cancer Sister 12  . Diabetes Father   . Cancer Sister   . Diabetes Sister   . Atrial fibrillation Sister   . Kidney disease Maternal Aunt     Social History   Socioeconomic History  . Marital status: Married    Spouse name: Not on file  . Number of children: 1  . Years of education: college3  . Highest education level: Not on file  Occupational History  . Occupation: Retired  Scientific laboratory technician  . Financial resource strain: Not on file  . Food insecurity:    Worry: Not on file    Inability: Not on file  . Transportation needs:    Medical: Not on file    Non-medical: Not on file  Tobacco Use  . Smoking status: Former Smoker    Packs/day: 1.00    Years: 40.00    Pack years: 40.00    Types: Cigarettes    Last attempt to quit: 08/28/2005    Years since quitting: 12.5  . Smokeless tobacco: Never Used  . Tobacco comment: 9 years  Substance and Sexual Activity  . Alcohol use: Yes    Alcohol/week: 0.0 oz    Comment: rare 2 per month  . Drug use: No  . Sexual activity: Never  Lifestyle  . Physical activity:    Days per week: Not on file    Minutes per session: Not on file  . Stress: Not on file  Relationships  . Social connections:    Talks on phone: Not on file    Gets together: Not on file    Attends  religious service: Not on file    Active member of club or organization: Not on file    Attends meetings of clubs or organizations: Not on file    Relationship status: Not on file  . Intimate partner violence:    Fear of current or ex partner: Not on file    Emotionally abused: Not on file    Physically abused: Not on file    Forced sexual activity: Not on file  Other Topics Concern  . Not on file  Social History Narrative  . Not on file    Review of Systems: See HPI, otherwise negative ROS  Physical Exam: BP 104/89   Pulse 67   Temp (!) 97.2 F (36.2 C) (Tympanic)  Resp 16   Ht 5\' 7"  (1.702 m)   Wt 59 kg (130 lb)   SpO2 100%   BMI 20.36 kg/m  General:   Alert,  pleasant and cooperative in NAD Head:  Normocephalic and atraumatic. Neck:  Supple; no masses or thyromegaly. Lungs:  Clear throughout to auscultation.    Heart:  Regular rate and rhythm. Abdomen:  Soft, nontender and nondistended. Normal bowel sounds, without guarding, and without rebound.   Neurologic:  Alert and  oriented x4;  grossly normal neurologically.  Impression/Plan: Donna Evans is here for an endoscopy to be performed for epigastric abdominal pain  Risks, benefits, limitations, and alternatives regarding  endoscopy have been reviewed with the patient.  Questions have been answered.  All parties agreeable.   Gaylyn Cheers, MD  03/07/2018, 1:22 PM

## 2018-03-08 ENCOUNTER — Encounter: Payer: Self-pay | Admitting: Unknown Physician Specialty

## 2018-03-08 LAB — SURGICAL PATHOLOGY

## 2018-03-09 NOTE — Anesthesia Postprocedure Evaluation (Signed)
Anesthesia Post Note  Patient: Donna Evans  Procedure(s) Performed: ESOPHAGOGASTRODUODENOSCOPY (EGD) WITH PROPOFOL (N/A )  Patient location during evaluation: PACU Anesthesia Type: General Level of consciousness: awake and alert Pain management: pain level controlled Vital Signs Assessment: post-procedure vital signs reviewed and stable Respiratory status: spontaneous breathing, nonlabored ventilation, respiratory function stable and patient connected to nasal cannula oxygen Cardiovascular status: blood pressure returned to baseline and stable Postop Assessment: no apparent nausea or vomiting Anesthetic complications: no     Last Vitals:  Vitals:   03/07/18 1344 03/07/18 1406  BP: (!) 89/58 114/75  Pulse: 62 (!) 58  Resp: 11 15  Temp:    SpO2: 97% 97%    Last Pain:  Vitals:   03/08/18 0742  TempSrc:   PainSc: 0-No pain                 Molli Barrows

## 2018-03-10 DIAGNOSIS — I10 Essential (primary) hypertension: Secondary | ICD-10-CM | POA: Diagnosis not present

## 2018-03-10 DIAGNOSIS — M797 Fibromyalgia: Secondary | ICD-10-CM | POA: Diagnosis not present

## 2018-03-10 DIAGNOSIS — R32 Unspecified urinary incontinence: Secondary | ICD-10-CM | POA: Diagnosis not present

## 2018-03-10 DIAGNOSIS — G4709 Other insomnia: Secondary | ICD-10-CM | POA: Diagnosis not present

## 2018-03-10 DIAGNOSIS — N189 Chronic kidney disease, unspecified: Secondary | ICD-10-CM | POA: Diagnosis not present

## 2018-03-10 DIAGNOSIS — J0100 Acute maxillary sinusitis, unspecified: Secondary | ICD-10-CM | POA: Diagnosis not present

## 2018-03-10 DIAGNOSIS — K219 Gastro-esophageal reflux disease without esophagitis: Secondary | ICD-10-CM | POA: Diagnosis not present

## 2018-03-10 DIAGNOSIS — M810 Age-related osteoporosis without current pathological fracture: Secondary | ICD-10-CM | POA: Diagnosis not present

## 2018-03-10 DIAGNOSIS — F411 Generalized anxiety disorder: Secondary | ICD-10-CM | POA: Diagnosis not present

## 2018-03-10 DIAGNOSIS — G8929 Other chronic pain: Secondary | ICD-10-CM | POA: Diagnosis not present

## 2018-03-10 DIAGNOSIS — F4320 Adjustment disorder, unspecified: Secondary | ICD-10-CM | POA: Diagnosis not present

## 2018-03-15 NOTE — Addendum Note (Signed)
Addendum  created 03/15/18 1023 by Alphonsus Sias, MD   Attestation recorded in [place], [place] filed

## 2018-04-19 DIAGNOSIS — F411 Generalized anxiety disorder: Secondary | ICD-10-CM | POA: Diagnosis not present

## 2018-04-19 DIAGNOSIS — M810 Age-related osteoporosis without current pathological fracture: Secondary | ICD-10-CM | POA: Diagnosis not present

## 2018-04-19 DIAGNOSIS — M797 Fibromyalgia: Secondary | ICD-10-CM | POA: Diagnosis not present

## 2018-04-19 DIAGNOSIS — J0100 Acute maxillary sinusitis, unspecified: Secondary | ICD-10-CM | POA: Diagnosis not present

## 2018-04-19 DIAGNOSIS — F4320 Adjustment disorder, unspecified: Secondary | ICD-10-CM | POA: Diagnosis not present

## 2018-04-19 DIAGNOSIS — R32 Unspecified urinary incontinence: Secondary | ICD-10-CM | POA: Diagnosis not present

## 2018-04-19 DIAGNOSIS — K219 Gastro-esophageal reflux disease without esophagitis: Secondary | ICD-10-CM | POA: Diagnosis not present

## 2018-04-19 DIAGNOSIS — G8929 Other chronic pain: Secondary | ICD-10-CM | POA: Diagnosis not present

## 2018-04-19 DIAGNOSIS — G4709 Other insomnia: Secondary | ICD-10-CM | POA: Diagnosis not present

## 2018-04-19 DIAGNOSIS — N189 Chronic kidney disease, unspecified: Secondary | ICD-10-CM | POA: Diagnosis not present

## 2018-04-19 DIAGNOSIS — R5383 Other fatigue: Secondary | ICD-10-CM | POA: Diagnosis not present

## 2018-06-16 DIAGNOSIS — K219 Gastro-esophageal reflux disease without esophagitis: Secondary | ICD-10-CM | POA: Diagnosis not present

## 2018-06-16 DIAGNOSIS — J0100 Acute maxillary sinusitis, unspecified: Secondary | ICD-10-CM | POA: Diagnosis not present

## 2018-06-16 DIAGNOSIS — F4320 Adjustment disorder, unspecified: Secondary | ICD-10-CM | POA: Diagnosis not present

## 2018-06-16 DIAGNOSIS — F411 Generalized anxiety disorder: Secondary | ICD-10-CM | POA: Diagnosis not present

## 2018-06-16 DIAGNOSIS — M810 Age-related osteoporosis without current pathological fracture: Secondary | ICD-10-CM | POA: Diagnosis not present

## 2018-06-16 DIAGNOSIS — N189 Chronic kidney disease, unspecified: Secondary | ICD-10-CM | POA: Diagnosis not present

## 2018-06-16 DIAGNOSIS — M797 Fibromyalgia: Secondary | ICD-10-CM | POA: Diagnosis not present

## 2018-06-16 DIAGNOSIS — R32 Unspecified urinary incontinence: Secondary | ICD-10-CM | POA: Diagnosis not present

## 2018-06-16 DIAGNOSIS — G8929 Other chronic pain: Secondary | ICD-10-CM | POA: Diagnosis not present

## 2018-06-16 DIAGNOSIS — G4709 Other insomnia: Secondary | ICD-10-CM | POA: Diagnosis not present

## 2018-06-16 DIAGNOSIS — I10 Essential (primary) hypertension: Secondary | ICD-10-CM | POA: Diagnosis not present

## 2018-06-19 DIAGNOSIS — R5383 Other fatigue: Secondary | ICD-10-CM | POA: Diagnosis not present

## 2018-06-19 DIAGNOSIS — K210 Gastro-esophageal reflux disease with esophagitis: Secondary | ICD-10-CM | POA: Diagnosis not present

## 2018-06-19 DIAGNOSIS — E538 Deficiency of other specified B group vitamins: Secondary | ICD-10-CM | POA: Diagnosis not present

## 2018-06-19 DIAGNOSIS — M810 Age-related osteoporosis without current pathological fracture: Secondary | ICD-10-CM | POA: Diagnosis not present

## 2018-06-21 DIAGNOSIS — M810 Age-related osteoporosis without current pathological fracture: Secondary | ICD-10-CM | POA: Diagnosis not present

## 2018-07-21 ENCOUNTER — Encounter: Payer: Self-pay | Admitting: Internal Medicine

## 2018-07-21 ENCOUNTER — Emergency Department: Payer: PPO

## 2018-07-21 ENCOUNTER — Other Ambulatory Visit: Payer: Self-pay

## 2018-07-21 ENCOUNTER — Emergency Department
Admission: EM | Admit: 2018-07-21 | Discharge: 2018-07-21 | Disposition: A | Discharge status: Home / Self Care | Payer: PPO | Attending: Emergency Medicine | Admitting: Emergency Medicine

## 2018-07-21 DIAGNOSIS — R079 Chest pain, unspecified: Secondary | ICD-10-CM | POA: Diagnosis not present

## 2018-07-21 DIAGNOSIS — Z91040 Latex allergy status: Secondary | ICD-10-CM | POA: Diagnosis not present

## 2018-07-21 DIAGNOSIS — I10 Essential (primary) hypertension: Secondary | ICD-10-CM | POA: Diagnosis not present

## 2018-07-21 DIAGNOSIS — Z87891 Personal history of nicotine dependence: Secondary | ICD-10-CM | POA: Diagnosis not present

## 2018-07-21 DIAGNOSIS — Z7982 Long term (current) use of aspirin: Secondary | ICD-10-CM | POA: Diagnosis not present

## 2018-07-21 DIAGNOSIS — J449 Chronic obstructive pulmonary disease, unspecified: Secondary | ICD-10-CM | POA: Insufficient documentation

## 2018-07-21 DIAGNOSIS — R0789 Other chest pain: Secondary | ICD-10-CM | POA: Diagnosis not present

## 2018-07-21 DIAGNOSIS — Z79899 Other long term (current) drug therapy: Secondary | ICD-10-CM | POA: Insufficient documentation

## 2018-07-21 DIAGNOSIS — I201 Angina pectoris with documented spasm: Secondary | ICD-10-CM | POA: Diagnosis not present

## 2018-07-21 LAB — CBC
HCT: 40.3 % (ref 35.0–47.0)
Hemoglobin: 13.9 g/dL (ref 12.0–16.0)
MCH: 30.8 pg (ref 26.0–34.0)
MCHC: 34.4 g/dL (ref 32.0–36.0)
MCV: 89.4 fL (ref 80.0–100.0)
PLATELETS: 200 10*3/uL (ref 150–440)
RBC: 4.51 MIL/uL (ref 3.80–5.20)
RDW: 14.4 % (ref 11.5–14.5)
WBC: 10.2 10*3/uL (ref 3.6–11.0)

## 2018-07-21 LAB — BASIC METABOLIC PANEL
Anion gap: 6 (ref 5–15)
BUN: 36 mg/dL — AB (ref 8–23)
CO2: 29 mmol/L (ref 22–32)
CREATININE: 1.1 mg/dL — AB (ref 0.44–1.00)
Calcium: 9.2 mg/dL (ref 8.9–10.3)
Chloride: 106 mmol/L (ref 98–111)
GFR calc Af Amer: 57 mL/min — ABNORMAL LOW (ref >60)
GFR calc non Af Amer: 49 mL/min — ABNORMAL LOW (ref >60)
Glucose, Bld: 137 mg/dL — ABNORMAL HIGH (ref 70–99)
Potassium: 3.9 mmol/L (ref 3.5–5.1)
SODIUM: 141 mmol/L (ref 135–145)

## 2018-07-21 LAB — TROPONIN I
Troponin I: <0.03 ng/mL (ref <0.03)
Troponin I: <0.03 ng/mL (ref <0.03)

## 2018-07-21 NOTE — ED Provider Notes (Signed)
[facility] Emergency Department Provider Note    First MD Initiated Contact with Patient 07/21/18 [phone]     (approximate)  I have reviewed the triage vital signs and the nursing notes.   HISTORY  Chief Complaint Chest Pain    HPI Donna Evans is a 72 y.o. female with below list of chronic medical conditions including fibromyalgia, Prinzmetal angina presents to the emergency department with acute onset of 9 out of 10 central chest tightness radiating to the left jaw which began at midnight tonight.  Patient states she thought it was secondary to indigestion and as such she took Tums and baking soda with water without any relief of discomfort.  Patient subsequently took 2 nitroglycerin as well as 5 baby aspirins.  Patient states that she was given an additional nitro spray in route to the emergency department.  Patient states current pain score is 2 out of 10.  Patient denies any dyspnea no diaphoresis.  Patient denies any nausea vomiting.  Past Medical History:  Diagnosis Date  . Allergic state   . Anginal pain ([place])    Prinzmetal's angina  . Anxiety   . Arthritis    osteoarthritis  . Breast cancer ([place]) 1990   right breast cancer  . Chronic pain   . Chronic pain   . COPD (chronic obstructive pulmonary disease) ([place])   . Edema   . Fibromyalgia   . Foot fracture    Bilateral  . GERD (gastroesophageal reflux disease)   . Hypertension   . Leg fracture, right   . Low back pain   . Lumbosacral neuritis   . Medulloadrenal hyperfunction ([place])   . Migraine headache   . Peripheral neuropathy   . Shoulder fracture, right   . Systemic lupus erythematosus ([place])   . Thyroid disease   . TIA (transient ischemic attack) 11/28/2013  . Transient global amnesia 2011    Patient Active Problem List   Diagnosis Date Noted  . Long term current use of opiate analgesic 07/04/2017  . Long term prescription opiate use 07/04/2017  . Opiate use 07/04/2017  .  Bursitis of shoulder 07/04/2017  . Closed fracture of upper end of humerus 07/04/2017  . Knee pain 07/04/2017  . Localized, primary osteoarthritis 07/04/2017  . Osteoarthritis of knee 07/04/2017  . Pain in limb 07/04/2017  . Sprain of wrist 07/04/2017  . Pain in joint involving ankle and foot 07/04/2017  . Impingement syndrome of left shoulder region 06/29/2017  . Trochanteric bursitis 06/29/2017  . AKI (acute kidney injury) ([place]) 10/01/2016  . Syncope 09/30/2016  . Migraine headache 08/10/2016  . Controlled substance agreement signed 06/12/2016  . Insomnia 06/12/2016  . Hypertension, benign 02/04/2016  . Easy bruisability 11/13/2015  . Osteoporosis 11/13/2015  . Fibromyalgia syndrome 11/13/2015  . Generalized anxiety disorder 11/13/2015  . Chronic pain syndrome 11/13/2015  . Overactive detrusor 09/11/2015  . Adrenal adenoma 09/02/2015  . Chronic cystitis 09/02/2015  . Incomplete bladder emptying 09/02/2015  . Renal colic 76/73/4193  . Gross hematuria 07/28/2015  . History of reconstruction of both breasts 06/18/2015  . Acquired absence of both breasts and nipples 11/20/2014  . Transient cerebral ischemia 11/28/2013  . Hemiplegia of dominant side ([place]) 11/20/2013    Past Surgical History:  Procedure Laterality Date  . ABDOMINAL HYSTERECTOMY  1981  . APPENDECTOMY  1957  . AUGMENTATION MAMMAPLASTY Bilateral 1990   saline submuscular  . BACK SURGERY  1989  . BILATERAL TOTAL MASTECTOMY WITH AXILLARY LYMPH  NODE DISSECTION  1990  . BREAST IMPLANT EXCHANGE Bilateral 05/11/2016   Procedure: REMOVAL AND REPLACEMENT OF BREAST IMPLANTS ;  Surgeon: Irene Limbo, MD;  Location: [place];  Service: Plastics;  Laterality: Bilateral;  . CAPSULECTOMY Bilateral 05/11/2016   Procedure: CAPSULECTOMY CAPSULORRAPHY ;  Surgeon: Irene Limbo, MD;  Location: [place];  Service: Plastics;  Laterality: Bilateral;  . CHOLECYSTECTOMY  1979  . COLONOSCOPY WITH  PROPOFOL N/A 01/25/2018   Procedure: COLONOSCOPY WITH PROPOFOL;  Surgeon: Manya Silvas, MD;  Location: [facility] ENDOSCOPY;  Service: Endoscopy;  Laterality: N/A;  . COPD    . ESOPHAGOGASTRODUODENOSCOPY (EGD) WITH PROPOFOL N/A 03/07/2018   Procedure: ESOPHAGOGASTRODUODENOSCOPY (EGD) WITH PROPOFOL;  Surgeon: Manya Silvas, MD;  Location: [facility] ENDOSCOPY;  Service: Endoscopy;  Laterality: N/A;  . LIPOSUCTION Right 05/11/2016   Procedure: LIPOSUCTION;  Surgeon: Irene Limbo, MD;  Location: [place];  Service: Plastics;  Laterality: Right;  . LIVER BIOPSY  2011  . MASTECTOMY SUBCUTANEOUS Bilateral 1990  . MASTOPEXY Bilateral 05/11/2016   Procedure: MASTOPEXY BILATERAL ;  Surgeon: Irene Limbo, MD;  Location: [place];  Service: Plastics;  Laterality: Bilateral;    Prior to Admission medications   Medication Sig Start Date End Date Taking? Authorizing Provider  ALPRAZolam Duanne Moron) 1 MG tablet Take 1 tablet (1 mg total) by mouth 3 (three) times daily as needed for anxiety. 10/22/16   Kathrine Haddock, NP  aspirin (GOODSENSE ASPIRIN) 81 MG chewable tablet Chew by mouth.    [provider]  butorphanol (STADOL) 10 MG/ML nasal spray Place 1 spray into the nose every 4 (four) hours as needed for headache. 10/22/16   Kathrine Haddock, NP  cyclobenzaprine (FLEXERIL) 10 MG tablet Take 1 tablet (10 mg total) by mouth at bedtime. 10/22/16   Kathrine Haddock, NP  escitalopram (LEXAPRO) 20 MG tablet Take 1 tablet (20 mg total) by mouth daily. Patient not taking: Reported on 01/25/2018 10/22/16   Kathrine Haddock, NP  gabapentin (NEURONTIN) 100 MG capsule Take 100 mg by mouth 3 (three) times daily.    [provider]  hydrochlorothiazide (HYDRODIURIL) 12.5 MG tablet Take 1 tablet (12.5 mg total) by mouth 2 (two) times daily. Patient not taking: Reported on 03/07/2018 10/22/16   Kathrine Haddock, NP  torsemide (DEMADEX) 20 MG tablet Take 20 mg by mouth daily.     [provider]  zolpidem (AMBIEN) 10 MG tablet Take 1 tablet (10 mg total) by mouth at bedtime. 10/22/16   Kathrine Haddock, NP    Allergies Ciprofloxacin; Codeine; Demerol [meperidine]; Fluconazole; Latex; Morphine and related; Duloxetine hcl; Flagyl [metronidazole]; Hydrocodone-acetaminophen; Influenza vaccines; Tape; Valtrex [valacyclovir hcl]; Amoxicillin-pot clavulanate; Sulfamethoxazole-trimethoprim; and Valacyclovir  Family History  Problem Relation Age of Onset  . Atrial fibrillation Mother   . Atrial fibrillation Sister   . Cancer Sister   . Diabetes Sister   . Breast cancer Sister 70  . Diabetes Father   . Cancer Sister   . Diabetes Sister   . Atrial fibrillation Sister   . Kidney disease Maternal Aunt     Social History Social History   Tobacco Use  . Smoking status: Former Smoker    Packs/day: 1.00    Years: 40.00    Pack years: 40.00    Types: Cigarettes    Last attempt to quit: 08/28/2005    Years since quitting: 12.9  . Smokeless tobacco: Never Used  . Tobacco comment: 9 years  Substance Use Topics  . Alcohol  use: Yes    Alcohol/week: 0.0 standard drinks    Comment: rare 2 per month  . Drug use: No    Review of Systems Constitutional: No fever/chills Eyes: No visual changes. ENT: No sore throat. Cardiovascular: Positive for chest pain. Respiratory: Denies shortness of breath. Gastrointestinal: No abdominal pain.  No nausea, no vomiting.  No diarrhea.  No constipation. Genitourinary: Negative for dysuria. Musculoskeletal: Negative for neck pain.  Negative for back pain. Integumentary: Negative for rash. Neurological: Negative for headaches, focal weakness or numbness.  ____________________________________________   PHYSICAL EXAM:  VITAL SIGNS: ED Triage Vitals  Enc Vitals Group     BP 07/21/18 0238 129/61     Pulse Rate 07/21/18 0238 70     Resp 07/21/18 0238 10     Temp --      Temp src --      SpO2 07/21/18 0234 98 %     Weight  07/21/18 0242 58.5 kg (129 lb)     Height 07/21/18 0242 1.702 m (5\' 7" )     Head Circumference --      Peak Flow --      Pain Score 07/21/18 0241 2     Pain Loc --      Pain Edu? --      Excl. in [place]? --     Constitutional: Alert and oriented. Well appearing and in no acute distress. Eyes: Conjunctivae are normal. Head: Atraumatic. Mouth/Throat: Mucous membranes are moist.  Oropharynx non-erythematous. Neck: No stridor.   Cardiovascular: Normal rate, regular rhythm. Good peripheral circulation. Grossly normal heart sounds. Respiratory: Normal respiratory effort.  No retractions. Lungs CTAB. Gastrointestinal: Soft and nontender. No distention.  Musculoskeletal: No lower extremity tenderness nor edema. No gross deformities of extremities. Neurologic:  Normal speech and language. No gross focal neurologic deficits are appreciated.  Skin:  Skin is warm, dry and intact. No rash noted. Psychiatric: Mood and affect are normal. Speech and behavior are normal.  ____________________________________________   LABS (all labs ordered are listed, but only abnormal results are displayed)  Labs Reviewed  BASIC METABOLIC PANEL - Abnormal; Notable for the following components:      Result Value   Glucose, Bld 137 (*)    BUN 36 (*)    Creatinine, Ser 1.10 (*)    GFR calc non Af Amer 49 (*)    GFR calc Af Amer 57 (*)    All other components within normal limits  CBC  TROPONIN I  TROPONIN I   ____________________________________________  EKG  ED ECG REPORT I, [place] Lovelle Deitrick, the attending physician, personally viewed and interpreted this ECG.   Date: 07/21/2018  EKG Time: 2:39 AM  Rate: 71  Rhythm: Sinus rhythm  Axis: Normal  Intervals: Normal  ST&T Change: None  ____________________________________________  RADIOLOGY I, [place] Devin, personally viewed and evaluated these images (plain radiographs) as part of my medical decision making, as well as reviewing the written  report by the radiologist.  ED MD interpretation: Active cardiopulmonary disease.  Official radiology report(s): Dg Chest Port 1 View  Result Date: 07/21/2018 CLINICAL DATA:  Indigestion with mild substernal left-sided chest pressure. EXAM: PORTABLE CHEST 1 VIEW COMPARISON:  None. FINDINGS: The heart size and mediastinal contours are within normal limits. Both lungs are clear. Hazy opacities overlying the lung bases compatible with overlying ceiling bilateral breast implants. No acute pulmonary consolidation, effusion or CHF. No pneumothorax. The visualized skeletal structures are unremarkable. IMPRESSION: No active disease. Electronically Signed  By: Ashley Royalty M.D.   On: 07/21/2018 03:08     Procedures   ____________________________________________   INITIAL IMPRESSION / ASSESSMENT AND PLAN / ED COURSE  As part of my medical decision making, I reviewed the following data within the electronic MEDICAL RECORD NUMBER   72 year old female presented with above-stated history and physical exam secondary to chest pain.  Considered possibility of ACS and as such EKG was performed which revealed no evidence of ischemia or infarct.  Troponin negative x2 chest x-ray unremarkable.  Patient chest pain-free without any dyspnea or any other symptoms at this time.  Suspect possible exacerbation the patient's known Prinzmetal angina as the etiology for the patient's pain.  I spoke with the patient at length regarding warning signs that would warrant immediate return to the emergency department.  Patient advised to follow-up with Dr. Ubaldo Glassing today    ____________________________________________  FINAL CLINICAL IMPRESSION(S) / ED DIAGNOSES  Final diagnoses:  Chest pain, unspecified type  Prinzmetal angina ([place])     MEDICATIONS GIVEN DURING THIS VISIT:  Medications - No data to display   ED Discharge Orders    None       Note:  This document was prepared using Dragon voice recognition software  and may include unintentional dictation errors.    Gregor Hams, MD 07/21/18 [phone]

## 2018-07-21 NOTE — ED Triage Notes (Addendum)
Pt had onset of what she thought was indigestion around midnight. She tried tums and baking soda with water with no relief in indigestion. She took 2 nitroglycerin. After two hours she called 911. Took 5 baby aspirins. EMS reports she got nitro x1 in route. Pt now reporting 2/10 substernal (L) chest pressure that is radiating to her left jaw.

## 2018-07-27 DIAGNOSIS — M9902 Segmental and somatic dysfunction of thoracic region: Secondary | ICD-10-CM | POA: Diagnosis not present

## 2018-07-27 DIAGNOSIS — M5134 Other intervertebral disc degeneration, thoracic region: Secondary | ICD-10-CM | POA: Diagnosis not present

## 2018-07-27 DIAGNOSIS — M5412 Radiculopathy, cervical region: Secondary | ICD-10-CM | POA: Diagnosis not present

## 2018-07-27 DIAGNOSIS — M9901 Segmental and somatic dysfunction of cervical region: Secondary | ICD-10-CM | POA: Diagnosis not present

## 2018-07-28 DIAGNOSIS — M5412 Radiculopathy, cervical region: Secondary | ICD-10-CM | POA: Diagnosis not present

## 2018-07-28 DIAGNOSIS — M9901 Segmental and somatic dysfunction of cervical region: Secondary | ICD-10-CM | POA: Diagnosis not present

## 2018-07-28 DIAGNOSIS — M9902 Segmental and somatic dysfunction of thoracic region: Secondary | ICD-10-CM | POA: Diagnosis not present

## 2018-07-28 DIAGNOSIS — M5134 Other intervertebral disc degeneration, thoracic region: Secondary | ICD-10-CM | POA: Diagnosis not present

## 2018-07-31 DIAGNOSIS — M5134 Other intervertebral disc degeneration, thoracic region: Secondary | ICD-10-CM | POA: Diagnosis not present

## 2018-07-31 DIAGNOSIS — M9901 Segmental and somatic dysfunction of cervical region: Secondary | ICD-10-CM | POA: Diagnosis not present

## 2018-07-31 DIAGNOSIS — M9902 Segmental and somatic dysfunction of thoracic region: Secondary | ICD-10-CM | POA: Diagnosis not present

## 2018-07-31 DIAGNOSIS — M5412 Radiculopathy, cervical region: Secondary | ICD-10-CM | POA: Diagnosis not present

## 2018-08-02 DIAGNOSIS — E538 Deficiency of other specified B group vitamins: Secondary | ICD-10-CM | POA: Diagnosis not present

## 2018-08-02 DIAGNOSIS — E559 Vitamin D deficiency, unspecified: Secondary | ICD-10-CM | POA: Diagnosis not present

## 2018-08-03 DIAGNOSIS — M9902 Segmental and somatic dysfunction of thoracic region: Secondary | ICD-10-CM | POA: Diagnosis not present

## 2018-08-03 DIAGNOSIS — M5134 Other intervertebral disc degeneration, thoracic region: Secondary | ICD-10-CM | POA: Diagnosis not present

## 2018-08-03 DIAGNOSIS — M9901 Segmental and somatic dysfunction of cervical region: Secondary | ICD-10-CM | POA: Diagnosis not present

## 2018-08-03 DIAGNOSIS — M5412 Radiculopathy, cervical region: Secondary | ICD-10-CM | POA: Diagnosis not present

## 2018-08-04 DIAGNOSIS — M5412 Radiculopathy, cervical region: Secondary | ICD-10-CM | POA: Diagnosis not present

## 2018-08-04 DIAGNOSIS — M5134 Other intervertebral disc degeneration, thoracic region: Secondary | ICD-10-CM | POA: Diagnosis not present

## 2018-08-04 DIAGNOSIS — M9902 Segmental and somatic dysfunction of thoracic region: Secondary | ICD-10-CM | POA: Diagnosis not present

## 2018-08-04 DIAGNOSIS — M9901 Segmental and somatic dysfunction of cervical region: Secondary | ICD-10-CM | POA: Diagnosis not present

## 2018-08-10 DIAGNOSIS — M5134 Other intervertebral disc degeneration, thoracic region: Secondary | ICD-10-CM | POA: Diagnosis not present

## 2018-08-10 DIAGNOSIS — M9901 Segmental and somatic dysfunction of cervical region: Secondary | ICD-10-CM | POA: Diagnosis not present

## 2018-08-10 DIAGNOSIS — M5412 Radiculopathy, cervical region: Secondary | ICD-10-CM | POA: Diagnosis not present

## 2018-08-10 DIAGNOSIS — M9902 Segmental and somatic dysfunction of thoracic region: Secondary | ICD-10-CM | POA: Diagnosis not present

## 2018-08-15 DIAGNOSIS — M5134 Other intervertebral disc degeneration, thoracic region: Secondary | ICD-10-CM | POA: Diagnosis not present

## 2018-08-15 DIAGNOSIS — M9902 Segmental and somatic dysfunction of thoracic region: Secondary | ICD-10-CM | POA: Diagnosis not present

## 2018-08-15 DIAGNOSIS — M5412 Radiculopathy, cervical region: Secondary | ICD-10-CM | POA: Diagnosis not present

## 2018-08-15 DIAGNOSIS — M9901 Segmental and somatic dysfunction of cervical region: Secondary | ICD-10-CM | POA: Diagnosis not present

## 2018-08-16 DIAGNOSIS — M9901 Segmental and somatic dysfunction of cervical region: Secondary | ICD-10-CM | POA: Diagnosis not present

## 2018-08-16 DIAGNOSIS — M5412 Radiculopathy, cervical region: Secondary | ICD-10-CM | POA: Diagnosis not present

## 2018-08-16 DIAGNOSIS — M5134 Other intervertebral disc degeneration, thoracic region: Secondary | ICD-10-CM | POA: Diagnosis not present

## 2018-08-16 DIAGNOSIS — M9902 Segmental and somatic dysfunction of thoracic region: Secondary | ICD-10-CM | POA: Diagnosis not present

## 2018-08-17 DIAGNOSIS — M9902 Segmental and somatic dysfunction of thoracic region: Secondary | ICD-10-CM | POA: Diagnosis not present

## 2018-08-17 DIAGNOSIS — M5412 Radiculopathy, cervical region: Secondary | ICD-10-CM | POA: Diagnosis not present

## 2018-08-17 DIAGNOSIS — M9901 Segmental and somatic dysfunction of cervical region: Secondary | ICD-10-CM | POA: Diagnosis not present

## 2018-08-17 DIAGNOSIS — M5134 Other intervertebral disc degeneration, thoracic region: Secondary | ICD-10-CM | POA: Diagnosis not present

## 2018-08-18 DIAGNOSIS — M9902 Segmental and somatic dysfunction of thoracic region: Secondary | ICD-10-CM | POA: Diagnosis not present

## 2018-08-18 DIAGNOSIS — M5412 Radiculopathy, cervical region: Secondary | ICD-10-CM | POA: Diagnosis not present

## 2018-08-18 DIAGNOSIS — M9901 Segmental and somatic dysfunction of cervical region: Secondary | ICD-10-CM | POA: Diagnosis not present

## 2018-08-18 DIAGNOSIS — M5134 Other intervertebral disc degeneration, thoracic region: Secondary | ICD-10-CM | POA: Diagnosis not present

## 2018-08-21 DIAGNOSIS — M5134 Other intervertebral disc degeneration, thoracic region: Secondary | ICD-10-CM | POA: Diagnosis not present

## 2018-08-21 DIAGNOSIS — M9902 Segmental and somatic dysfunction of thoracic region: Secondary | ICD-10-CM | POA: Diagnosis not present

## 2018-08-21 DIAGNOSIS — M5412 Radiculopathy, cervical region: Secondary | ICD-10-CM | POA: Diagnosis not present

## 2018-08-21 DIAGNOSIS — M9901 Segmental and somatic dysfunction of cervical region: Secondary | ICD-10-CM | POA: Diagnosis not present

## 2018-08-29 DIAGNOSIS — M9902 Segmental and somatic dysfunction of thoracic region: Secondary | ICD-10-CM | POA: Diagnosis not present

## 2018-08-29 DIAGNOSIS — M5412 Radiculopathy, cervical region: Secondary | ICD-10-CM | POA: Diagnosis not present

## 2018-08-29 DIAGNOSIS — M9901 Segmental and somatic dysfunction of cervical region: Secondary | ICD-10-CM | POA: Diagnosis not present

## 2018-08-29 DIAGNOSIS — M5134 Other intervertebral disc degeneration, thoracic region: Secondary | ICD-10-CM | POA: Diagnosis not present

## 2018-08-30 DIAGNOSIS — M5412 Radiculopathy, cervical region: Secondary | ICD-10-CM | POA: Diagnosis not present

## 2018-08-30 DIAGNOSIS — M9902 Segmental and somatic dysfunction of thoracic region: Secondary | ICD-10-CM | POA: Diagnosis not present

## 2018-08-30 DIAGNOSIS — M9901 Segmental and somatic dysfunction of cervical region: Secondary | ICD-10-CM | POA: Diagnosis not present

## 2018-08-30 DIAGNOSIS — M5134 Other intervertebral disc degeneration, thoracic region: Secondary | ICD-10-CM | POA: Diagnosis not present

## 2018-08-30 DIAGNOSIS — R197 Diarrhea, unspecified: Secondary | ICD-10-CM | POA: Diagnosis not present

## 2018-09-05 DIAGNOSIS — R0789 Other chest pain: Secondary | ICD-10-CM | POA: Diagnosis not present

## 2018-09-05 DIAGNOSIS — I10 Essential (primary) hypertension: Secondary | ICD-10-CM | POA: Diagnosis not present

## 2018-09-05 DIAGNOSIS — R002 Palpitations: Secondary | ICD-10-CM | POA: Diagnosis not present

## 2018-09-12 DIAGNOSIS — M5134 Other intervertebral disc degeneration, thoracic region: Secondary | ICD-10-CM | POA: Diagnosis not present

## 2018-09-12 DIAGNOSIS — M5412 Radiculopathy, cervical region: Secondary | ICD-10-CM | POA: Diagnosis not present

## 2018-09-12 DIAGNOSIS — M9901 Segmental and somatic dysfunction of cervical region: Secondary | ICD-10-CM | POA: Diagnosis not present

## 2018-09-12 DIAGNOSIS — M9902 Segmental and somatic dysfunction of thoracic region: Secondary | ICD-10-CM | POA: Diagnosis not present

## 2018-09-13 DIAGNOSIS — M5412 Radiculopathy, cervical region: Secondary | ICD-10-CM | POA: Diagnosis not present

## 2018-09-13 DIAGNOSIS — M9901 Segmental and somatic dysfunction of cervical region: Secondary | ICD-10-CM | POA: Diagnosis not present

## 2018-09-13 DIAGNOSIS — M5134 Other intervertebral disc degeneration, thoracic region: Secondary | ICD-10-CM | POA: Diagnosis not present

## 2018-09-13 DIAGNOSIS — M9902 Segmental and somatic dysfunction of thoracic region: Secondary | ICD-10-CM | POA: Diagnosis not present

## 2018-09-15 DIAGNOSIS — H2513 Age-related nuclear cataract, bilateral: Secondary | ICD-10-CM | POA: Diagnosis not present

## 2018-09-18 DIAGNOSIS — F4320 Adjustment disorder, unspecified: Secondary | ICD-10-CM | POA: Diagnosis not present

## 2018-09-18 DIAGNOSIS — M9901 Segmental and somatic dysfunction of cervical region: Secondary | ICD-10-CM | POA: Diagnosis not present

## 2018-09-18 DIAGNOSIS — R32 Unspecified urinary incontinence: Secondary | ICD-10-CM | POA: Diagnosis not present

## 2018-09-18 DIAGNOSIS — G4709 Other insomnia: Secondary | ICD-10-CM | POA: Diagnosis not present

## 2018-09-18 DIAGNOSIS — M810 Age-related osteoporosis without current pathological fracture: Secondary | ICD-10-CM | POA: Diagnosis not present

## 2018-09-18 DIAGNOSIS — M9902 Segmental and somatic dysfunction of thoracic region: Secondary | ICD-10-CM | POA: Diagnosis not present

## 2018-09-18 DIAGNOSIS — M5412 Radiculopathy, cervical region: Secondary | ICD-10-CM | POA: Diagnosis not present

## 2018-09-18 DIAGNOSIS — I10 Essential (primary) hypertension: Secondary | ICD-10-CM | POA: Diagnosis not present

## 2018-09-18 DIAGNOSIS — N189 Chronic kidney disease, unspecified: Secondary | ICD-10-CM | POA: Diagnosis not present

## 2018-09-18 DIAGNOSIS — F411 Generalized anxiety disorder: Secondary | ICD-10-CM | POA: Diagnosis not present

## 2018-09-18 DIAGNOSIS — G8929 Other chronic pain: Secondary | ICD-10-CM | POA: Diagnosis not present

## 2018-09-18 DIAGNOSIS — M797 Fibromyalgia: Secondary | ICD-10-CM | POA: Diagnosis not present

## 2018-09-18 DIAGNOSIS — M5134 Other intervertebral disc degeneration, thoracic region: Secondary | ICD-10-CM | POA: Diagnosis not present

## 2018-09-18 DIAGNOSIS — K219 Gastro-esophageal reflux disease without esophagitis: Secondary | ICD-10-CM | POA: Diagnosis not present

## 2018-09-19 DIAGNOSIS — R0789 Other chest pain: Secondary | ICD-10-CM | POA: Diagnosis not present

## 2018-09-26 DIAGNOSIS — M9902 Segmental and somatic dysfunction of thoracic region: Secondary | ICD-10-CM | POA: Diagnosis not present

## 2018-09-26 DIAGNOSIS — M5134 Other intervertebral disc degeneration, thoracic region: Secondary | ICD-10-CM | POA: Diagnosis not present

## 2018-09-26 DIAGNOSIS — M5412 Radiculopathy, cervical region: Secondary | ICD-10-CM | POA: Diagnosis not present

## 2018-09-26 DIAGNOSIS — M9901 Segmental and somatic dysfunction of cervical region: Secondary | ICD-10-CM | POA: Diagnosis not present

## 2018-09-28 DIAGNOSIS — M5412 Radiculopathy, cervical region: Secondary | ICD-10-CM | POA: Diagnosis not present

## 2018-09-28 DIAGNOSIS — M5134 Other intervertebral disc degeneration, thoracic region: Secondary | ICD-10-CM | POA: Diagnosis not present

## 2018-09-28 DIAGNOSIS — M9902 Segmental and somatic dysfunction of thoracic region: Secondary | ICD-10-CM | POA: Diagnosis not present

## 2018-09-28 DIAGNOSIS — M9901 Segmental and somatic dysfunction of cervical region: Secondary | ICD-10-CM | POA: Diagnosis not present

## 2018-10-05 DIAGNOSIS — M5134 Other intervertebral disc degeneration, thoracic region: Secondary | ICD-10-CM | POA: Diagnosis not present

## 2018-10-05 DIAGNOSIS — M9902 Segmental and somatic dysfunction of thoracic region: Secondary | ICD-10-CM | POA: Diagnosis not present

## 2018-10-05 DIAGNOSIS — M5412 Radiculopathy, cervical region: Secondary | ICD-10-CM | POA: Diagnosis not present

## 2018-10-05 DIAGNOSIS — M9901 Segmental and somatic dysfunction of cervical region: Secondary | ICD-10-CM | POA: Diagnosis not present

## 2018-10-11 DIAGNOSIS — M810 Age-related osteoporosis without current pathological fracture: Secondary | ICD-10-CM | POA: Diagnosis not present

## 2018-12-11 DIAGNOSIS — J209 Acute bronchitis, unspecified: Secondary | ICD-10-CM | POA: Diagnosis not present

## 2018-12-18 DIAGNOSIS — H2511 Age-related nuclear cataract, right eye: Secondary | ICD-10-CM | POA: Diagnosis not present

## 2018-12-20 ENCOUNTER — Encounter: Payer: Self-pay | Admitting: *Deleted

## 2018-12-26 ENCOUNTER — Ambulatory Visit: Payer: PPO | Admitting: Certified Registered Nurse Anesthetist

## 2018-12-26 ENCOUNTER — Ambulatory Visit
Admission: RE | Admit: 2018-12-26 | Discharge: 2018-12-26 | Disposition: A | Discharge status: Home / Self Care | Payer: PPO | Attending: Ophthalmology | Admitting: Ophthalmology

## 2018-12-26 ENCOUNTER — Encounter
Admission: RE | Disposition: A | Discharge status: Home / Self Care | Payer: Self-pay | Source: Home / Self Care | Attending: Ophthalmology

## 2018-12-26 ENCOUNTER — Encounter: Payer: Self-pay | Admitting: Certified Registered Nurse Anesthetist

## 2018-12-26 ENCOUNTER — Other Ambulatory Visit: Payer: Self-pay

## 2018-12-26 DIAGNOSIS — Z79899 Other long term (current) drug therapy: Secondary | ICD-10-CM | POA: Diagnosis not present

## 2018-12-26 DIAGNOSIS — I2510 Atherosclerotic heart disease of native coronary artery without angina pectoris: Secondary | ICD-10-CM | POA: Insufficient documentation

## 2018-12-26 DIAGNOSIS — Z8673 Personal history of transient ischemic attack (TIA), and cerebral infarction without residual deficits: Secondary | ICD-10-CM | POA: Diagnosis not present

## 2018-12-26 DIAGNOSIS — K219 Gastro-esophageal reflux disease without esophagitis: Secondary | ICD-10-CM | POA: Diagnosis not present

## 2018-12-26 DIAGNOSIS — Z87891 Personal history of nicotine dependence: Secondary | ICD-10-CM | POA: Diagnosis not present

## 2018-12-26 DIAGNOSIS — M810 Age-related osteoporosis without current pathological fracture: Secondary | ICD-10-CM | POA: Diagnosis not present

## 2018-12-26 DIAGNOSIS — G629 Polyneuropathy, unspecified: Secondary | ICD-10-CM | POA: Diagnosis not present

## 2018-12-26 DIAGNOSIS — Z853 Personal history of malignant neoplasm of breast: Secondary | ICD-10-CM | POA: Diagnosis not present

## 2018-12-26 DIAGNOSIS — J449 Chronic obstructive pulmonary disease, unspecified: Secondary | ICD-10-CM | POA: Insufficient documentation

## 2018-12-26 DIAGNOSIS — F329 Major depressive disorder, single episode, unspecified: Secondary | ICD-10-CM | POA: Insufficient documentation

## 2018-12-26 DIAGNOSIS — F419 Anxiety disorder, unspecified: Secondary | ICD-10-CM | POA: Insufficient documentation

## 2018-12-26 DIAGNOSIS — I10 Essential (primary) hypertension: Secondary | ICD-10-CM | POA: Diagnosis not present

## 2018-12-26 DIAGNOSIS — Z79891 Long term (current) use of opiate analgesic: Secondary | ICD-10-CM | POA: Diagnosis not present

## 2018-12-26 DIAGNOSIS — M329 Systemic lupus erythematosus, unspecified: Secondary | ICD-10-CM | POA: Diagnosis not present

## 2018-12-26 DIAGNOSIS — H2512 Age-related nuclear cataract, left eye: Secondary | ICD-10-CM | POA: Diagnosis not present

## 2018-12-26 DIAGNOSIS — H2511 Age-related nuclear cataract, right eye: Secondary | ICD-10-CM | POA: Diagnosis not present

## 2018-12-26 HISTORY — PX: CATARACT EXTRACTION W/PHACO: SHX586

## 2018-12-26 HISTORY — DX: Cerebral infarction, unspecified: I63.9

## 2018-12-26 SURGERY — PHACOEMULSIFICATION, CATARACT, WITH IOL INSERTION
Anesthesia: Monitor Anesthesia Care | Site: Eye | Laterality: Right

## 2018-12-26 MED ORDER — MIDAZOLAM HCL 2 MG/2ML IJ SOLN
INTRAMUSCULAR | Status: AC
  Filled 2018-12-26: qty 2

## 2018-12-26 MED ORDER — MIDAZOLAM HCL 2 MG/2ML IJ SOLN
INTRAMUSCULAR | Status: DC | PRN
  Administered 2018-12-26 (×2): 1 mg via INTRAVENOUS

## 2018-12-26 MED ORDER — ARMC OPHTHALMIC DILATING DROPS
OPHTHALMIC | Status: AC
  Administered 2018-12-26: 1 via OPHTHALMIC
  Filled 2018-12-26: qty 0.5

## 2018-12-26 MED ORDER — NA CHONDROIT SULF-NA HYALURON 40-17 MG/ML IO SOLN
INTRAOCULAR | Status: DC | PRN
  Administered 2018-12-26: 1 mL via INTRAOCULAR

## 2018-12-26 MED ORDER — MOXIFLOXACIN HCL 0.5 % OP SOLN
OPHTHALMIC | Status: DC | PRN
  Administered 2018-12-26: .2 mL via OPHTHALMIC

## 2018-12-26 MED ORDER — LIDOCAINE HCL (PF) 4 % IJ SOLN
INTRAOCULAR | Status: DC | PRN
  Administered 2018-12-26: 2 mL via OPHTHALMIC

## 2018-12-26 MED ORDER — ARMC OPHTHALMIC DILATING DROPS
1.0000 "application " | OPHTHALMIC | Status: AC
  Administered 2018-12-26 (×3): 1 via OPHTHALMIC

## 2018-12-26 MED ORDER — POVIDONE-IODINE 5 % OP SOLN
OPHTHALMIC | Status: DC | PRN
  Administered 2018-12-26: 1 via OPHTHALMIC

## 2018-12-26 MED ORDER — MOXIFLOXACIN HCL 0.5 % OP SOLN: 1.0000 [drp] | OPHTHALMIC | Status: DC | PRN

## 2018-12-26 MED ORDER — EPINEPHRINE PF 1 MG/ML IJ SOLN
INTRAOCULAR | Status: DC | PRN
  Administered 2018-12-26: 1 mL via OPHTHALMIC

## 2018-12-26 MED ORDER — MOXIFLOXACIN HCL 0.5 % OP SOLN
OPHTHALMIC | Status: AC
  Filled 2018-12-26: qty 3

## 2018-12-26 MED ORDER — SODIUM CHLORIDE 0.9 % IV SOLN
INTRAVENOUS | Status: DC
  Administered 2018-12-26: 12:00:00 via INTRAVENOUS

## 2018-12-26 MED ORDER — CARBACHOL 0.01 % IO SOLN
INTRAOCULAR | Status: DC | PRN
  Administered 2018-12-26: .5 mL via INTRAOCULAR

## 2018-12-26 MED ORDER — TETRACAINE HCL 0.5 % OP SOLN
1.0000 [drp] | OPHTHALMIC | Status: AC | PRN
  Administered 2018-12-26 (×3): 1 [drp] via OPHTHALMIC

## 2018-12-26 MED ORDER — TETRACAINE HCL 0.5 % OP SOLN
OPHTHALMIC | Status: AC
  Administered 2018-12-26: 1 [drp] via OPHTHALMIC
  Filled 2018-12-26: qty 4

## 2018-12-26 SURGICAL SUPPLY — 16 items
GLOVE BIO SURGEON STRL SZ8 (GLOVE) ×3 IMPLANT
GLOVE BIOGEL M 6.5 STRL (GLOVE) ×3 IMPLANT
GLOVE SURG LX 8.0 MICRO (GLOVE) ×2
GLOVE SURG LX STRL 8.0 MICRO (GLOVE) ×1 IMPLANT
GOWN STRL REUS W/ TWL LRG LVL3 (GOWN DISPOSABLE) ×2 IMPLANT
GOWN STRL REUS W/TWL LRG LVL3 (GOWN DISPOSABLE) ×4
LABEL CATARACT MEDS ST (LABEL) ×3 IMPLANT
LENS IOL TECNIS ITEC 22.0 (Intraocular Lens) ×2 IMPLANT
PACK CATARACT (MISCELLANEOUS) ×3 IMPLANT
PACK CATARACT BRASINGTON LX (MISCELLANEOUS) ×3 IMPLANT
PACK EYE AFTER SURG (MISCELLANEOUS) ×3 IMPLANT
SOL BSS BAG (MISCELLANEOUS) ×3
SOLUTION BSS BAG (MISCELLANEOUS) ×1 IMPLANT
SYR 5ML LL (SYRINGE) ×3 IMPLANT
WATER STERILE IRR 250ML POUR (IV SOLUTION) ×3 IMPLANT
WIPE NON LINTING 3.25X3.25 (MISCELLANEOUS) ×3 IMPLANT

## 2018-12-26 NOTE — Anesthesia Postprocedure Evaluation (Signed)
Anesthesia Post Note  Patient: [name]  Procedure(s) Performed: CATARACT EXTRACTION PHACO AND INTRAOCULAR LENS PLACEMENT (IOC) RIGHT (Right Eye)  Patient location during evaluation: Short Stay Anesthesia Type: MAC Level of consciousness: awake and alert, oriented and patient cooperative Pain management: satisfactory to patient Vital Signs Assessment: post-procedure vital signs reviewed and stable Respiratory status: spontaneous breathing, nonlabored ventilation and respiratory function stable Cardiovascular status: blood pressure returned to baseline and stable Postop Assessment: no headache and no apparent nausea or vomiting Anesthetic complications: no     Last Vitals:  Vitals:   12/26/18 1103 12/26/18 1233  BP: 134/72   Pulse: (!) 57 (!) 53  Resp: 16 16  Temp: (!) 36.1 C (!) 36.2 C  SpO2: 100% 100%    Last Pain:  Vitals:   12/26/18 1233  TempSrc: Temporal  PainSc: 2                  Eben Burow

## 2018-12-26 NOTE — Addendum Note (Signed)
Addendum  created 12/26/18 1241 by Mahogony Gilchrest, Precious Haws, MD   Attestation recorded in [place], [place] filed

## 2018-12-26 NOTE — Discharge Instructions (Addendum)
Eye Surgery Discharge Instructions  Expect mild scratchy sensation or mild soreness. DO NOT RUB YOUR EYE!  The day of surgery:  Minimal physical activity, but bed rest is not required  No reading, computer work, or close hand work  No bending, lifting, or straining.  May watch TV  For 24 hours:  No driving, legal decisions, or alcoholic beverages  Safety precautions  Eat anything you prefer: It is better to start with liquids, then soup then solid foods.  Solar shield eyeglasses should be worn for comfort in the sunlight/patch while sleeping  Resume all regular medications including aspirin or Coumadin if these were discontinued prior to surgery. You may shower, bathe, shave, or wash your hair. Tylenol may be taken for mild discomfort. FOLLOW DR. PORFILIO'S EYE DROP INSTRUCTION SHEET AS REVIEWED.  Call your doctor if you experience significant pain, nausea, or vomiting, fever > 101 or other signs of infection. [phone] or [phone] Specific instructions:  Follow-up Information    Birder Robson, MD Follow up.   Specialty:  Ophthalmology Why:  12/27/18 @ 9:00 am Contact information: [place] [address] [phone]

## 2018-12-26 NOTE — Op Note (Signed)
PREOPERATIVE DIAGNOSIS:  Nuclear sclerotic cataract of the right eye.   POSTOPERATIVE DIAGNOSIS:  nuclear sclerotic cataract right eye   OPERATIVE PROCEDURE: Procedure(s): CATARACT EXTRACTION PHACO AND INTRAOCULAR LENS PLACEMENT (IOC) RIGHT   SURGEON:  Birder Robson, MD.   ANESTHESIA:  Anesthesiologist: Piscitello, Precious Haws, MD CRNA: Eben Burow, CRNA  1.      Managed anesthesia care. 2.      0.39ml of Shugarcaine was instilled in the eye following the paracentesis.   COMPLICATIONS:  None.   TECHNIQUE:   Stop and chop   DESCRIPTION OF PROCEDURE:  The patient was examined and consented in the preoperative holding area where the aforementioned topical anesthesia was applied to the right eye and then brought back to the Operating Room where the right eye was prepped and draped in the usual sterile ophthalmic fashion and a lid speculum was placed. A paracentesis was created with the side port blade and the anterior chamber was filled with viscoelastic. A near clear corneal incision was performed with the steel keratome. A continuous curvilinear capsulorrhexis was performed with a cystotome followed by the capsulorrhexis forceps. Hydrodissection and hydrodelineation were carried out with BSS on a blunt cannula. The lens was removed in a stop and chop  technique and the remaining cortical material was removed with the irrigation-aspiration handpiece. The capsular bag was inflated with viscoelastic and the Technis [id number]  lens was placed in the capsular bag without complication. The remaining viscoelastic was removed from the eye with the irrigation-aspiration handpiece. The wounds were hydrated. the eye was inflated to physiologic pressure. 0.42ml of Vigamox was placed in the anterior chamber. The wounds were found to be water tight. The eye was dressed with Vigamox. The patient was given protective glasses to wear throughout the day and a shield with which to sleep tonight. The patient was also  given drops with which to begin a drop regimen today and will follow-up with me in one day. Implant Name Type Inv. Item Serial No. Manufacturer Lot No. LRB No. Used  LENS IOL DIOP 22.0 - [id number] Intraocular Lens LENS IOL DIOP 22.0 [id number] AMO  Right 1   Procedure(s) with comments: CATARACT EXTRACTION PHACO AND INTRAOCULAR LENS PLACEMENT (IOC) RIGHT (Right) - [place]  00:46 CDE 8.50 Fluid pack lot # [id number]  Electronically signed: Birder Robson 12/26/2018 12:32 PM

## 2018-12-26 NOTE — Anesthesia Post-op Follow-up Note (Signed)
Anesthesia QCDR form completed.        

## 2018-12-26 NOTE — H&P (Signed)
All labs reviewed. Abnormal studies sent to patients PCP when indicated.  Previous H&P reviewed, patient examined, there are NO CHANGES.  Donna Gonzalo Porfilio1/14/202012:10 PM

## 2018-12-26 NOTE — Anesthesia Preprocedure Evaluation (Signed)
Anesthesia Evaluation  Patient identified by MRN, date of birth, ID band Patient awake    Reviewed: Allergy & Precautions, H&P , NPO status , Patient's Chart, lab work & pertinent test results, reviewed documented beta blocker date and time   History of Anesthesia Complications Negative for: history of anesthetic complications  Airway Mallampati: III  TM Distance: <3 FB Neck ROM: limited    Dental  (+) Poor Dentition   Pulmonary neg shortness of breath, COPD, former smoker,    Pulmonary exam normal        Cardiovascular Exercise Tolerance: Good hypertension, On Medications (-) anginawith exertion + CAD  Normal cardiovascular exam Rhythm:regular Rate:Normal     Neuro/Psych  Headaches, PSYCHIATRIC DISORDERS Anxiety TIA Neuromuscular disease CVA negative psych ROS   GI/Hepatic Neg liver ROS, GERD  Medicated and Controlled,  Endo/Other  negative endocrine ROS  Renal/GU Renal disease  negative genitourinary   Musculoskeletal  (+) Arthritis ,   Abdominal   Peds  Hematology negative hematology ROS (+)   Anesthesia Other Findings Past Medical History: No date: Allergic state No date: Anginal pain ([place])     Comment:  Prinzmetal's angina No date: Anxiety No date: Arthritis     Comment:  osteoarthritis 1990: Breast cancer ([place])     Comment:  right breast cancer No date: Chronic pain No date: Chronic pain No date: COPD (chronic obstructive pulmonary disease) ([facility]) No date: Edema No date: Fibromyalgia No date: Foot fracture     Comment:  Bilateral No date: GERD (gastroesophageal reflux disease) No date: Hypertension No date: Leg fracture, right No date: Low back pain No date: Lumbosacral neuritis No date: Medulloadrenal hyperfunction ([facility]) No date: Migraine headache No date: Peripheral neuropathy No date: Shoulder fracture, right No date: Systemic lupus erythematosus ([place]) No date: Thyroid  disease 11/28/2013: TIA (transient ischemic attack) 2011: Transient global amnesia Past Surgical History: 1981: ABDOMINAL HYSTERECTOMY 1957: APPENDECTOMY 1990: AUGMENTATION MAMMAPLASTY; Bilateral     Comment:  saline submuscular 1989: BACK SURGERY 1990: BILATERAL TOTAL MASTECTOMY WITH AXILLARY LYMPH NODE DISSECTION 05/11/2016: BREAST IMPLANT EXCHANGE; Bilateral     Comment:  Procedure: REMOVAL AND REPLACEMENT OF BREAST IMPLANTS ;               Surgeon: Irene Limbo, MD;  Location: [place];  Service: Plastics;  Laterality:               Bilateral; 05/11/2016: CAPSULECTOMY; Bilateral     Comment:  Procedure: CAPSULECTOMY CAPSULORRAPHY ;  Surgeon: Irene Limbo, MD;  Location: [place];                Service: Plastics;  Laterality: Bilateral; 1979: CHOLECYSTECTOMY 01/25/2018: COLONOSCOPY WITH PROPOFOL; N/A     Comment:  Procedure: COLONOSCOPY WITH PROPOFOL;  Surgeon: Manya Silvas, MD;  Location: [facility] ENDOSCOPY;  Service:               Endoscopy;  Laterality: N/A; No date: COPD 05/11/2016: LIPOSUCTION; Right     Comment:  Procedure: LIPOSUCTION;  Surgeon: Irene Limbo, MD;               Location: [place];  Service: Plastics;  Laterality: Right; 2011: LIVER BIOPSY 1990: MASTECTOMY SUBCUTANEOUS; Bilateral 05/11/2016: MASTOPEXY; Bilateral     Comment:  Procedure: MASTOPEXY BILATERAL ;  Surgeon: Irene Limbo, MD;  Location: [place];                Service: Plastics;  Laterality: Bilateral;   Reproductive/Obstetrics negative OB ROS                             Anesthesia Physical  Anesthesia Plan  ASA: III  Anesthesia Plan: MAC   Post-op Pain Management:    Induction: Intravenous  PONV Risk Score and Plan:   Airway Management Planned: Natural Airway and Nasal Cannula  Additional Equipment:    Intra-op Plan:   Post-operative Plan:   Informed Consent: I have reviewed the patients History and Physical, chart, labs and discussed the procedure including the risks, benefits and alternatives for the proposed anesthesia with the patient or authorized representative who has indicated his/her understanding and acceptance.     Dental Advisory Given  Plan Discussed with: CRNA  Anesthesia Plan Comments: (Patient consented for risks of anesthesia including but not limited to:  - adverse reactions to medications - damage to teeth, lips or other oral mucosa - sore throat or hoarseness - Damage to heart, brain, lungs or loss of life  Patient voiced understanding.)        Anesthesia Quick Evaluation

## 2018-12-26 NOTE — Transfer of Care (Signed)
Immediate Anesthesia Transfer of Care Note  Patient: Donna Evans  Procedure(s) Performed: CATARACT EXTRACTION PHACO AND INTRAOCULAR LENS PLACEMENT (IOC) RIGHT (Right Eye)  Patient Location: Short Stay  Anesthesia Type:MAC  Level of Consciousness: awake, alert , oriented and patient cooperative  Airway & Oxygen Therapy: Patient Spontanous Breathing  Post-op Assessment: Report given to RN and Post -op Vital signs reviewed and stable  Post vital signs: Reviewed and stable  Last Vitals:  Vitals Value Taken Time  BP    Temp    Pulse    Resp    SpO2      Last Pain:  Vitals:   12/26/18 1103  TempSrc: Tympanic  PainSc: 0-No pain         Complications: No apparent anesthesia complications

## 2018-12-27 ENCOUNTER — Encounter: Payer: Self-pay | Admitting: Ophthalmology

## 2018-12-29 DIAGNOSIS — E669 Obesity, unspecified: Secondary | ICD-10-CM | POA: Diagnosis not present

## 2018-12-29 DIAGNOSIS — I10 Essential (primary) hypertension: Secondary | ICD-10-CM | POA: Diagnosis not present

## 2018-12-29 DIAGNOSIS — N189 Chronic kidney disease, unspecified: Secondary | ICD-10-CM | POA: Diagnosis not present

## 2019-01-03 DIAGNOSIS — I10 Essential (primary) hypertension: Secondary | ICD-10-CM | POA: Diagnosis not present

## 2019-01-03 DIAGNOSIS — G4709 Other insomnia: Secondary | ICD-10-CM | POA: Diagnosis not present

## 2019-01-03 DIAGNOSIS — M797 Fibromyalgia: Secondary | ICD-10-CM | POA: Diagnosis not present

## 2019-01-03 DIAGNOSIS — M810 Age-related osteoporosis without current pathological fracture: Secondary | ICD-10-CM | POA: Diagnosis not present

## 2019-01-03 DIAGNOSIS — K219 Gastro-esophageal reflux disease without esophagitis: Secondary | ICD-10-CM | POA: Diagnosis not present

## 2019-01-03 DIAGNOSIS — F4320 Adjustment disorder, unspecified: Secondary | ICD-10-CM | POA: Diagnosis not present

## 2019-01-03 DIAGNOSIS — G8929 Other chronic pain: Secondary | ICD-10-CM | POA: Diagnosis not present

## 2019-01-03 DIAGNOSIS — F411 Generalized anxiety disorder: Secondary | ICD-10-CM | POA: Diagnosis not present

## 2019-01-03 DIAGNOSIS — R32 Unspecified urinary incontinence: Secondary | ICD-10-CM | POA: Diagnosis not present

## 2019-01-03 DIAGNOSIS — E785 Hyperlipidemia, unspecified: Secondary | ICD-10-CM | POA: Diagnosis not present

## 2019-01-03 DIAGNOSIS — N189 Chronic kidney disease, unspecified: Secondary | ICD-10-CM | POA: Diagnosis not present

## 2019-01-09 DIAGNOSIS — H2512 Age-related nuclear cataract, left eye: Secondary | ICD-10-CM | POA: Diagnosis not present

## 2019-01-10 ENCOUNTER — Encounter: Payer: Self-pay | Admitting: *Deleted

## 2019-01-23 ENCOUNTER — Other Ambulatory Visit: Payer: Self-pay

## 2019-01-23 ENCOUNTER — Ambulatory Visit: Payer: PPO | Admitting: Anesthesiology

## 2019-01-23 ENCOUNTER — Encounter: Payer: Self-pay | Admitting: *Deleted

## 2019-01-23 ENCOUNTER — Encounter
Admission: RE | Disposition: A | Discharge status: Home / Self Care | Payer: Self-pay | Source: Home / Self Care | Attending: Ophthalmology

## 2019-01-23 ENCOUNTER — Ambulatory Visit
Admission: RE | Admit: 2019-01-23 | Discharge: 2019-01-23 | Disposition: A | Discharge status: Home / Self Care | Payer: PPO | Attending: Ophthalmology | Admitting: Ophthalmology

## 2019-01-23 DIAGNOSIS — Z8673 Personal history of transient ischemic attack (TIA), and cerebral infarction without residual deficits: Secondary | ICD-10-CM | POA: Insufficient documentation

## 2019-01-23 DIAGNOSIS — Z881 Allergy status to other antibiotic agents status: Secondary | ICD-10-CM | POA: Insufficient documentation

## 2019-01-23 DIAGNOSIS — F419 Anxiety disorder, unspecified: Secondary | ICD-10-CM | POA: Diagnosis not present

## 2019-01-23 DIAGNOSIS — Z853 Personal history of malignant neoplasm of breast: Secondary | ICD-10-CM | POA: Diagnosis not present

## 2019-01-23 DIAGNOSIS — I10 Essential (primary) hypertension: Secondary | ICD-10-CM | POA: Diagnosis not present

## 2019-01-23 DIAGNOSIS — Z880 Allergy status to penicillin: Secondary | ICD-10-CM | POA: Insufficient documentation

## 2019-01-23 DIAGNOSIS — Z882 Allergy status to sulfonamides status: Secondary | ICD-10-CM | POA: Insufficient documentation

## 2019-01-23 DIAGNOSIS — E275 Adrenomedullary hyperfunction: Secondary | ICD-10-CM | POA: Insufficient documentation

## 2019-01-23 DIAGNOSIS — Z87891 Personal history of nicotine dependence: Secondary | ICD-10-CM | POA: Diagnosis not present

## 2019-01-23 DIAGNOSIS — Z885 Allergy status to narcotic agent status: Secondary | ICD-10-CM | POA: Insufficient documentation

## 2019-01-23 DIAGNOSIS — F329 Major depressive disorder, single episode, unspecified: Secondary | ICD-10-CM | POA: Insufficient documentation

## 2019-01-23 DIAGNOSIS — M1990 Unspecified osteoarthritis, unspecified site: Secondary | ICD-10-CM | POA: Diagnosis not present

## 2019-01-23 DIAGNOSIS — G43909 Migraine, unspecified, not intractable, without status migrainosus: Secondary | ICD-10-CM | POA: Diagnosis not present

## 2019-01-23 DIAGNOSIS — Z79899 Other long term (current) drug therapy: Secondary | ICD-10-CM | POA: Insufficient documentation

## 2019-01-23 DIAGNOSIS — Z888 Allergy status to other drugs, medicaments and biological substances status: Secondary | ICD-10-CM | POA: Insufficient documentation

## 2019-01-23 DIAGNOSIS — K219 Gastro-esophageal reflux disease without esophagitis: Secondary | ICD-10-CM | POA: Diagnosis not present

## 2019-01-23 DIAGNOSIS — J449 Chronic obstructive pulmonary disease, unspecified: Secondary | ICD-10-CM | POA: Diagnosis not present

## 2019-01-23 DIAGNOSIS — G8929 Other chronic pain: Secondary | ICD-10-CM | POA: Insufficient documentation

## 2019-01-23 DIAGNOSIS — H2512 Age-related nuclear cataract, left eye: Secondary | ICD-10-CM | POA: Diagnosis not present

## 2019-01-23 DIAGNOSIS — G894 Chronic pain syndrome: Secondary | ICD-10-CM | POA: Diagnosis not present

## 2019-01-23 DIAGNOSIS — F418 Other specified anxiety disorders: Secondary | ICD-10-CM | POA: Diagnosis not present

## 2019-01-23 HISTORY — DX: Major depressive disorder, single episode, unspecified: F32.9

## 2019-01-23 HISTORY — PX: CATARACT EXTRACTION W/PHACO: SHX586

## 2019-01-23 HISTORY — DX: Depression, unspecified: F32.A

## 2019-01-23 SURGERY — PHACOEMULSIFICATION, CATARACT, WITH IOL INSERTION
Anesthesia: Monitor Anesthesia Care | Site: Eye | Laterality: Left

## 2019-01-23 MED ORDER — TETRACAINE HCL 0.5 % OP SOLN
OPHTHALMIC | Status: AC
  Administered 2019-01-23: 1 [drp] via OPHTHALMIC
  Filled 2019-01-23: qty 4

## 2019-01-23 MED ORDER — EPINEPHRINE PF 1 MG/ML IJ SOLN
INTRAMUSCULAR | Status: AC
  Filled 2019-01-23: qty 1

## 2019-01-23 MED ORDER — LIDOCAINE HCL (PF) 4 % IJ SOLN
INTRAOCULAR | Status: DC | PRN
  Administered 2019-01-23: 2 mL via OPHTHALMIC

## 2019-01-23 MED ORDER — ONDANSETRON HCL 4 MG/2ML IJ SOLN
INTRAMUSCULAR | Status: DC | PRN
  Administered 2019-01-23: 4 mg via INTRAVENOUS

## 2019-01-23 MED ORDER — ONDANSETRON HCL 4 MG/2ML IJ SOLN
INTRAMUSCULAR | Status: AC
  Filled 2019-01-23: qty 2

## 2019-01-23 MED ORDER — ARMC OPHTHALMIC DILATING DROPS
1.0000 "application " | OPHTHALMIC | Status: AC
  Administered 2019-01-23 (×3): 1 via OPHTHALMIC

## 2019-01-23 MED ORDER — POVIDONE-IODINE 5 % OP SOLN
OPHTHALMIC | Status: AC
  Filled 2019-01-23: qty 30

## 2019-01-23 MED ORDER — LIDOCAINE HCL (PF) 4 % IJ SOLN
INTRAMUSCULAR | Status: AC
  Filled 2019-01-23: qty 5

## 2019-01-23 MED ORDER — NA CHONDROIT SULF-NA HYALURON 40-17 MG/ML IO SOLN
INTRAOCULAR | Status: DC | PRN
  Administered 2019-01-23: 1 mL via INTRAOCULAR

## 2019-01-23 MED ORDER — SODIUM CHLORIDE 0.9 % IV SOLN
INTRAVENOUS | Status: DC
  Administered 2019-01-23: 09:00:00 via INTRAVENOUS

## 2019-01-23 MED ORDER — FENTANYL CITRATE (PF) 100 MCG/2ML IJ SOLN
INTRAMUSCULAR | Status: DC | PRN
  Administered 2019-01-23: 25 ug via INTRAVENOUS

## 2019-01-23 MED ORDER — MOXIFLOXACIN HCL 0.5 % OP SOLN
OPHTHALMIC | Status: AC
  Filled 2019-01-23: qty 3

## 2019-01-23 MED ORDER — EPINEPHRINE PF 1 MG/ML IJ SOLN
INTRAOCULAR | Status: DC | PRN
  Administered 2019-01-23: 1 mL via OPHTHALMIC

## 2019-01-23 MED ORDER — MOXIFLOXACIN HCL 0.5 % OP SOLN: 1.0000 [drp] | OPHTHALMIC | Status: DC | PRN

## 2019-01-23 MED ORDER — TETRACAINE HCL 0.5 % OP SOLN
1.0000 [drp] | OPHTHALMIC | Status: DC | PRN
  Administered 2019-01-23 (×2): 1 [drp] via OPHTHALMIC

## 2019-01-23 MED ORDER — NA CHONDROIT SULF-NA HYALURON 40-17 MG/ML IO SOLN
INTRAOCULAR | Status: AC
  Filled 2019-01-23: qty 1

## 2019-01-23 MED ORDER — MIDAZOLAM HCL 2 MG/2ML IJ SOLN
INTRAMUSCULAR | Status: AC
  Filled 2019-01-23: qty 2

## 2019-01-23 MED ORDER — POVIDONE-IODINE 5 % OP SOLN
OPHTHALMIC | Status: DC | PRN
  Administered 2019-01-23: 1 via OPHTHALMIC

## 2019-01-23 MED ORDER — ARMC OPHTHALMIC DILATING DROPS
OPHTHALMIC | Status: AC
  Administered 2019-01-23: 1 via OPHTHALMIC
  Filled 2019-01-23: qty 0.5

## 2019-01-23 MED ORDER — FENTANYL CITRATE (PF) 100 MCG/2ML IJ SOLN
INTRAMUSCULAR | Status: AC
  Filled 2019-01-23: qty 2

## 2019-01-23 MED ORDER — MOXIFLOXACIN HCL 0.5 % OP SOLN
OPHTHALMIC | Status: DC | PRN
  Administered 2019-01-23: .2 mL via OPHTHALMIC

## 2019-01-23 MED ORDER — MIDAZOLAM HCL 2 MG/2ML IJ SOLN
INTRAMUSCULAR | Status: DC | PRN
  Administered 2019-01-23: 1 mg via INTRAVENOUS

## 2019-01-23 SURGICAL SUPPLY — 16 items
GLOVE BIO SURGEON STRL SZ8 (GLOVE) ×3 IMPLANT
GLOVE BIOGEL M 6.5 STRL (GLOVE) ×3 IMPLANT
GLOVE SURG LX 8.0 MICRO (GLOVE) ×2
GLOVE SURG LX STRL 8.0 MICRO (GLOVE) ×1 IMPLANT
GOWN STRL REUS W/ TWL LRG LVL3 (GOWN DISPOSABLE) ×2 IMPLANT
GOWN STRL REUS W/TWL LRG LVL3 (GOWN DISPOSABLE) ×4
LABEL CATARACT MEDS ST (LABEL) ×3 IMPLANT
LENS IOL TECNIS ITEC 22.5 (Intraocular Lens) ×3 IMPLANT
PACK CATARACT (MISCELLANEOUS) ×3 IMPLANT
PACK CATARACT BRASINGTON LX (MISCELLANEOUS) ×3 IMPLANT
PACK EYE AFTER SURG (MISCELLANEOUS) ×3 IMPLANT
SOL BSS BAG (MISCELLANEOUS) ×3
SOLUTION BSS BAG (MISCELLANEOUS) ×1 IMPLANT
SYR 5ML LL (SYRINGE) ×3 IMPLANT
WATER STERILE IRR 250ML POUR (IV SOLUTION) ×3 IMPLANT
WIPE NON LINTING 3.25X3.25 (MISCELLANEOUS) ×3 IMPLANT

## 2019-01-23 NOTE — Anesthesia Preprocedure Evaluation (Signed)
Anesthesia Evaluation  Patient identified by MRN, date of birth, ID band Patient awake    Reviewed: Allergy & Precautions, NPO status , Patient's Chart, lab work & pertinent test results  History of Anesthesia Complications Negative for: history of anesthetic complications  Airway Mallampati: II  TM Distance: >3 FB Neck ROM: Full    Dental no notable dental hx.    Pulmonary neg sleep apnea, COPD, former smoker,    breath sounds clear to auscultation- rhonchi (-) wheezing      Cardiovascular hypertension, Pt. on medications (-) CAD, (-) Past MI, (-) Cardiac Stents and (-) CABG  Rhythm:Regular Rate:Normal - Systolic murmurs and - Diastolic murmurs    Neuro/Psych  Headaches, PSYCHIATRIC DISORDERS Anxiety Depression TIA   GI/Hepatic Neg liver ROS, GERD  ,  Endo/Other  negative endocrine ROSneg diabetes  Renal/GU Renal InsufficiencyRenal disease     Musculoskeletal  (+) Arthritis , Fibromyalgia -  Abdominal (+) - obese,   Peds  Hematology negative hematology ROS (+)   Anesthesia Other Findings Past Medical History: No date: Allergic state No date: Anginal pain ([place])     Comment:  Prinzmetal's angina No date: Anxiety No date: Arthritis     Comment:  osteoarthritis 1990: Breast cancer ([place])     Comment:  right breast cancer No date: Chronic pain No date: Chronic pain No date: COPD (chronic obstructive pulmonary disease) (HCC) No date: Depression No date: Edema No date: Fibromyalgia No date: Foot fracture     Comment:  Bilateral No date: GERD (gastroesophageal reflux disease) No date: Hypertension No date: Leg fracture, right No date: Low back pain No date: Lumbosacral neuritis No date: Medulloadrenal hyperfunction (HCC) No date: Migraine headache No date: Peripheral neuropathy No date: Shoulder fracture, right No date: Stroke [facility])     Comment:  TIA No date: Systemic lupus erythematosus ([place]) No date:  Thyroid disease 11/28/2013: TIA (transient ischemic attack) 2011: Transient global amnesia   Reproductive/Obstetrics                             Anesthesia Physical Anesthesia Plan  ASA: III  Anesthesia Plan: MAC   Post-op Pain Management:    Induction: Intravenous  PONV Risk Score and Plan: 2 and Midazolam  Airway Management Planned: Natural Airway  Additional Equipment:   Intra-op Plan:   Post-operative Plan:   Informed Consent: I have reviewed the patients History and Physical, chart, labs and discussed the procedure including the risks, benefits and alternatives for the proposed anesthesia with the patient or authorized representative who has indicated his/her understanding and acceptance.       Plan Discussed with: CRNA and Anesthesiologist  Anesthesia Plan Comments:         Anesthesia Quick Evaluation

## 2019-01-23 NOTE — Op Note (Signed)
PREOPERATIVE DIAGNOSIS:  Nuclear sclerotic cataract of the left eye.   POSTOPERATIVE DIAGNOSIS:  Nuclear sclerotic cataract of the left eye.   OPERATIVE PROCEDURE: Procedure(s): CATARACT EXTRACTION PHACO AND INTRAOCULAR LENS PLACEMENT ([place]) LEFT   SURGEON:  Birder Robson, MD.   ANESTHESIA:  Anesthesiologist: Emmie Niemann, MD CRNA: Doreen Salvage, CRNA; Marsh Dolly, CRNA  1.      Managed anesthesia care. 2.     0.36ml of Shugarcaine was instilled following the paracentesis   COMPLICATIONS:  None.   TECHNIQUE:   Stop and chop   DESCRIPTION OF PROCEDURE:  The patient was examined and consented in the preoperative holding area where the aforementioned topical anesthesia was applied to the left eye and then brought back to the Operating Room where the left eye was prepped and draped in the usual sterile ophthalmic fashion and a lid speculum was placed. A paracentesis was created with the side port blade and the anterior chamber was filled with viscoelastic. A near clear corneal incision was performed with the steel keratome. A continuous curvilinear capsulorrhexis was performed with a cystotome followed by the capsulorrhexis forceps. Hydrodissection and hydrodelineation were carried out with BSS on a blunt cannula. The lens was removed in a stop and chop  technique and the remaining cortical material was removed with the irrigation-aspiration handpiece. The capsular bag was inflated with viscoelastic and the Technis [id number] lens was placed in the capsular bag without complication. The remaining viscoelastic was removed from the eye with the irrigation-aspiration handpiece. The wounds were hydrated t and the eye was inflated to physiologic pressure. 0.23ml Vigamox was placed in the anterior chamber. The wounds were found to be water tight. The eye was dressed with Vigamox. The patient was given protective glasses to wear throughout the day and a shield with which to sleep tonight. The patient was  also given drops with which to begin a drop regimen today and will follow-up with me in one day. Implant Name Type Inv. Item Serial No. Manufacturer Lot No. LRB No. Used  LENS IOL DIOP 22.5 - [id number] Intraocular Lens LENS IOL DIOP 22.5 [id number]  Left 1    Procedure(s) with comments: CATARACT EXTRACTION PHACO AND INTRAOCULAR LENS PLACEMENT ([facility]) LEFT (Left) - [place]  00:45 CDE  8.41 fluid pack lot # [id number]  Electronically signed: Birder Robson 01/23/2019 10:12 AM

## 2019-01-23 NOTE — Discharge Instructions (Addendum)
Eye Surgery Discharge Instructions  Expect mild scratchy sensation or mild soreness. DO NOT RUB YOUR EYE!  The day of surgery:  Minimal physical activity, but bed rest is not required  No reading, computer work, or close hand work  No bending, lifting, or straining.  May watch TV  For 24 hours:  No driving, legal decisions, or alcoholic beverages  Safety precautions  Eat anything you prefer: It is better to start with liquids, then soup then solid foods.  Solar shield eyeglasses should be worn for comfort in the sunlight/patch while sleeping  Resume all regular medications including aspirin or Coumadin if these were discontinued prior to surgery. You may shower, bathe, shave, or wash your hair. Tylenol may be taken for mild discomfort. Follow Dr. Inda Coke eye drop instruction sheet as reviewed. Call your doctor if you experience significant pain, nausea, or vomiting, fever > 101 or other signs of infection. [phone] or [phone] Specific instructions:  Follow-up Information    Birder Robson, MD Follow up.   Specialty:  Ophthalmology Why:  01/24/19 @ 8:50 am Contact information: [place] [address] [phone]

## 2019-01-23 NOTE — H&P (Signed)
All labs reviewed. Abnormal studies sent to patients PCP when indicated.  Previous H&P reviewed, patient examined, there are NO CHANGES.  Donna Maravilla Porfilio2/11/20209:44 AM

## 2019-01-23 NOTE — Anesthesia Postprocedure Evaluation (Signed)
Anesthesia Post Note  Patient: Donna Evans  Procedure(s) Performed: CATARACT EXTRACTION PHACO AND INTRAOCULAR LENS PLACEMENT ([facility]) LEFT (Left Eye)  Patient location during evaluation: PACU Anesthesia Type: MAC Level of consciousness: awake and alert and oriented Pain management: pain level controlled Vital Signs Assessment: post-procedure vital signs reviewed and stable Respiratory status: spontaneous breathing, nonlabored ventilation and respiratory function stable Cardiovascular status: blood pressure returned to baseline and stable Postop Assessment: no signs of nausea or vomiting Anesthetic complications: no     Last Vitals:  Vitals:   01/23/19 0852  BP: 119/67  Pulse: 96  Temp: 37.1 C  SpO2: 96%    Last Pain:  Vitals:   01/23/19 0852  TempSrc: Temporal  PainSc: 0-No pain                 Tyeshia Cornforth

## 2019-01-23 NOTE — Anesthesia Procedure Notes (Signed)
Date/Time: 01/23/2019 9:52 AM Performed by: Doreen Salvage, CRNA Pre-anesthesia Checklist: Patient identified, Emergency Drugs available, Suction available and Patient being monitored Patient Re-evaluated:Patient Re-evaluated prior to induction Oxygen Delivery Method: Nasal cannula Induction Type: IV induction Dental Injury: Teeth and Oropharynx as per pre-operative assessment  Comments: Nasal cannula with etCO2 monitoring

## 2019-01-23 NOTE — Anesthesia Post-op Follow-up Note (Signed)
Anesthesia QCDR form completed.        

## 2019-01-23 NOTE — Transfer of Care (Signed)
Immediate Anesthesia Transfer of Care Note  Patient: Donna Evans  Procedure(s) Performed: CATARACT EXTRACTION PHACO AND INTRAOCULAR LENS PLACEMENT ([facility]) LEFT (Left Eye)  Patient Location: PACU and Short Stay  Anesthesia Type:MAC  Level of Consciousness: awake, alert  and oriented  Airway & Oxygen Therapy: Patient Spontanous Breathing  Post-op Assessment: Report given to RN  Post vital signs: Reviewed and stable  Last Vitals:  Vitals Value Taken Time  BP    Temp    Pulse    Resp    SpO2      Last Pain:  Vitals:   01/23/19 0852  TempSrc: Temporal  PainSc: 0-No pain         Complications: No apparent anesthesia complications

## 2019-02-28 DIAGNOSIS — Z961 Presence of intraocular lens: Secondary | ICD-10-CM | POA: Diagnosis not present

## 2019-04-13 DIAGNOSIS — M797 Fibromyalgia: Secondary | ICD-10-CM | POA: Diagnosis not present

## 2019-04-13 DIAGNOSIS — F411 Generalized anxiety disorder: Secondary | ICD-10-CM | POA: Diagnosis not present

## 2019-04-13 DIAGNOSIS — R32 Unspecified urinary incontinence: Secondary | ICD-10-CM | POA: Diagnosis not present

## 2019-04-13 DIAGNOSIS — N189 Chronic kidney disease, unspecified: Secondary | ICD-10-CM | POA: Diagnosis not present

## 2019-04-13 DIAGNOSIS — I10 Essential (primary) hypertension: Secondary | ICD-10-CM | POA: Diagnosis not present

## 2019-04-13 DIAGNOSIS — G8929 Other chronic pain: Secondary | ICD-10-CM | POA: Diagnosis not present

## 2019-04-13 DIAGNOSIS — K219 Gastro-esophageal reflux disease without esophagitis: Secondary | ICD-10-CM | POA: Diagnosis not present

## 2019-04-13 DIAGNOSIS — G4709 Other insomnia: Secondary | ICD-10-CM | POA: Diagnosis not present

## 2019-04-13 DIAGNOSIS — M810 Age-related osteoporosis without current pathological fracture: Secondary | ICD-10-CM | POA: Diagnosis not present

## 2019-04-23 DIAGNOSIS — M810 Age-related osteoporosis without current pathological fracture: Secondary | ICD-10-CM | POA: Diagnosis not present

## 2019-04-26 DIAGNOSIS — M810 Age-related osteoporosis without current pathological fracture: Secondary | ICD-10-CM | POA: Diagnosis not present

## 2019-04-26 DIAGNOSIS — E538 Deficiency of other specified B group vitamins: Secondary | ICD-10-CM | POA: Diagnosis not present

## 2019-04-26 DIAGNOSIS — E559 Vitamin D deficiency, unspecified: Secondary | ICD-10-CM | POA: Diagnosis not present

## 2019-04-26 DIAGNOSIS — R51 Headache: Secondary | ICD-10-CM | POA: Diagnosis not present

## 2019-04-26 DIAGNOSIS — R0789 Other chest pain: Secondary | ICD-10-CM | POA: Diagnosis not present

## 2019-07-16 DIAGNOSIS — F411 Generalized anxiety disorder: Secondary | ICD-10-CM | POA: Diagnosis not present

## 2019-07-16 DIAGNOSIS — G8929 Other chronic pain: Secondary | ICD-10-CM | POA: Diagnosis not present

## 2019-07-16 DIAGNOSIS — G4709 Other insomnia: Secondary | ICD-10-CM | POA: Diagnosis not present

## 2019-07-16 DIAGNOSIS — N390 Urinary tract infection, site not specified: Secondary | ICD-10-CM | POA: Diagnosis not present

## 2019-07-16 DIAGNOSIS — R32 Unspecified urinary incontinence: Secondary | ICD-10-CM | POA: Diagnosis not present

## 2019-07-16 DIAGNOSIS — I10 Essential (primary) hypertension: Secondary | ICD-10-CM | POA: Diagnosis not present

## 2019-07-16 DIAGNOSIS — M797 Fibromyalgia: Secondary | ICD-10-CM | POA: Diagnosis not present

## 2019-07-16 DIAGNOSIS — K219 Gastro-esophageal reflux disease without esophagitis: Secondary | ICD-10-CM | POA: Diagnosis not present

## 2019-07-16 DIAGNOSIS — M810 Age-related osteoporosis without current pathological fracture: Secondary | ICD-10-CM | POA: Diagnosis not present

## 2019-07-16 DIAGNOSIS — N189 Chronic kidney disease, unspecified: Secondary | ICD-10-CM | POA: Diagnosis not present

## 2019-07-25 DIAGNOSIS — G8929 Other chronic pain: Secondary | ICD-10-CM | POA: Diagnosis not present

## 2019-07-25 DIAGNOSIS — R32 Unspecified urinary incontinence: Secondary | ICD-10-CM | POA: Diagnosis not present

## 2019-07-25 DIAGNOSIS — N189 Chronic kidney disease, unspecified: Secondary | ICD-10-CM | POA: Diagnosis not present

## 2019-07-25 DIAGNOSIS — K219 Gastro-esophageal reflux disease without esophagitis: Secondary | ICD-10-CM | POA: Diagnosis not present

## 2019-07-25 DIAGNOSIS — N390 Urinary tract infection, site not specified: Secondary | ICD-10-CM | POA: Diagnosis not present

## 2019-07-25 DIAGNOSIS — G4709 Other insomnia: Secondary | ICD-10-CM | POA: Diagnosis not present

## 2019-07-25 DIAGNOSIS — M810 Age-related osteoporosis without current pathological fracture: Secondary | ICD-10-CM | POA: Diagnosis not present

## 2019-07-25 DIAGNOSIS — M797 Fibromyalgia: Secondary | ICD-10-CM | POA: Diagnosis not present

## 2019-07-25 DIAGNOSIS — F411 Generalized anxiety disorder: Secondary | ICD-10-CM | POA: Diagnosis not present

## 2019-07-25 DIAGNOSIS — I10 Essential (primary) hypertension: Secondary | ICD-10-CM | POA: Diagnosis not present

## 2019-08-27 DIAGNOSIS — H43813 Vitreous degeneration, bilateral: Secondary | ICD-10-CM | POA: Diagnosis not present

## 2019-09-05 DIAGNOSIS — M810 Age-related osteoporosis without current pathological fracture: Secondary | ICD-10-CM | POA: Diagnosis not present

## 2019-09-17 DIAGNOSIS — M797 Fibromyalgia: Secondary | ICD-10-CM | POA: Diagnosis not present

## 2019-09-17 DIAGNOSIS — N390 Urinary tract infection, site not specified: Secondary | ICD-10-CM | POA: Diagnosis not present

## 2019-09-17 DIAGNOSIS — F411 Generalized anxiety disorder: Secondary | ICD-10-CM | POA: Diagnosis not present

## 2019-09-17 DIAGNOSIS — R32 Unspecified urinary incontinence: Secondary | ICD-10-CM | POA: Diagnosis not present

## 2019-09-17 DIAGNOSIS — K219 Gastro-esophageal reflux disease without esophagitis: Secondary | ICD-10-CM | POA: Diagnosis not present

## 2019-09-17 DIAGNOSIS — M810 Age-related osteoporosis without current pathological fracture: Secondary | ICD-10-CM | POA: Diagnosis not present

## 2019-09-17 DIAGNOSIS — I10 Essential (primary) hypertension: Secondary | ICD-10-CM | POA: Diagnosis not present

## 2019-09-17 DIAGNOSIS — S83411A Sprain of medial collateral ligament of right knee, initial encounter: Secondary | ICD-10-CM | POA: Diagnosis not present

## 2019-09-17 DIAGNOSIS — G4709 Other insomnia: Secondary | ICD-10-CM | POA: Diagnosis not present

## 2019-09-17 DIAGNOSIS — G8929 Other chronic pain: Secondary | ICD-10-CM | POA: Diagnosis not present

## 2019-09-17 DIAGNOSIS — N189 Chronic kidney disease, unspecified: Secondary | ICD-10-CM | POA: Diagnosis not present

## 2019-09-17 DIAGNOSIS — Z23 Encounter for immunization: Secondary | ICD-10-CM | POA: Diagnosis not present

## 2019-09-26 DIAGNOSIS — E559 Vitamin D deficiency, unspecified: Secondary | ICD-10-CM | POA: Diagnosis not present

## 2019-09-26 DIAGNOSIS — E538 Deficiency of other specified B group vitamins: Secondary | ICD-10-CM | POA: Diagnosis not present

## 2019-09-26 DIAGNOSIS — M810 Age-related osteoporosis without current pathological fracture: Secondary | ICD-10-CM | POA: Diagnosis not present

## 2019-10-18 DIAGNOSIS — M810 Age-related osteoporosis without current pathological fracture: Secondary | ICD-10-CM | POA: Diagnosis not present

## 2019-10-23 DIAGNOSIS — R7989 Other specified abnormal findings of blood chemistry: Secondary | ICD-10-CM | POA: Diagnosis not present

## 2019-10-23 DIAGNOSIS — K219 Gastro-esophageal reflux disease without esophagitis: Secondary | ICD-10-CM | POA: Diagnosis not present

## 2019-10-23 DIAGNOSIS — G8929 Other chronic pain: Secondary | ICD-10-CM | POA: Diagnosis not present

## 2019-10-23 DIAGNOSIS — S83411A Sprain of medial collateral ligament of right knee, initial encounter: Secondary | ICD-10-CM | POA: Diagnosis not present

## 2019-10-23 DIAGNOSIS — G4709 Other insomnia: Secondary | ICD-10-CM | POA: Diagnosis not present

## 2019-10-23 DIAGNOSIS — N189 Chronic kidney disease, unspecified: Secondary | ICD-10-CM | POA: Diagnosis not present

## 2019-10-23 DIAGNOSIS — R32 Unspecified urinary incontinence: Secondary | ICD-10-CM | POA: Diagnosis not present

## 2019-10-23 DIAGNOSIS — M797 Fibromyalgia: Secondary | ICD-10-CM | POA: Diagnosis not present

## 2019-10-23 DIAGNOSIS — I10 Essential (primary) hypertension: Secondary | ICD-10-CM | POA: Diagnosis not present

## 2019-10-23 DIAGNOSIS — F411 Generalized anxiety disorder: Secondary | ICD-10-CM | POA: Diagnosis not present

## 2019-10-23 DIAGNOSIS — M810 Age-related osteoporosis without current pathological fracture: Secondary | ICD-10-CM | POA: Diagnosis not present

## 2019-10-31 DIAGNOSIS — R7989 Other specified abnormal findings of blood chemistry: Secondary | ICD-10-CM | POA: Diagnosis not present

## 2019-11-23 DIAGNOSIS — Z1329 Encounter for screening for other suspected endocrine disorder: Secondary | ICD-10-CM | POA: Diagnosis not present

## 2019-11-23 DIAGNOSIS — R7989 Other specified abnormal findings of blood chemistry: Secondary | ICD-10-CM | POA: Diagnosis not present

## 2019-11-23 DIAGNOSIS — I10 Essential (primary) hypertension: Secondary | ICD-10-CM | POA: Diagnosis not present

## 2019-11-23 DIAGNOSIS — N189 Chronic kidney disease, unspecified: Secondary | ICD-10-CM | POA: Diagnosis not present

## 2019-11-23 DIAGNOSIS — D519 Vitamin B12 deficiency anemia, unspecified: Secondary | ICD-10-CM | POA: Diagnosis not present

## 2019-11-23 DIAGNOSIS — E785 Hyperlipidemia, unspecified: Secondary | ICD-10-CM | POA: Diagnosis not present

## 2019-11-23 DIAGNOSIS — E559 Vitamin D deficiency, unspecified: Secondary | ICD-10-CM | POA: Diagnosis not present

## 2019-11-28 DIAGNOSIS — R7989 Other specified abnormal findings of blood chemistry: Secondary | ICD-10-CM | POA: Diagnosis not present

## 2019-11-28 DIAGNOSIS — M810 Age-related osteoporosis without current pathological fracture: Secondary | ICD-10-CM | POA: Diagnosis not present

## 2019-11-28 DIAGNOSIS — N390 Urinary tract infection, site not specified: Secondary | ICD-10-CM | POA: Diagnosis not present

## 2019-11-28 DIAGNOSIS — M7061 Trochanteric bursitis, right hip: Secondary | ICD-10-CM | POA: Diagnosis not present

## 2019-11-28 DIAGNOSIS — F411 Generalized anxiety disorder: Secondary | ICD-10-CM | POA: Diagnosis not present

## 2019-11-28 DIAGNOSIS — Z20828 Contact with and (suspected) exposure to other viral communicable diseases: Secondary | ICD-10-CM | POA: Diagnosis not present

## 2019-11-28 DIAGNOSIS — G4709 Other insomnia: Secondary | ICD-10-CM | POA: Diagnosis not present

## 2019-11-28 DIAGNOSIS — G8929 Other chronic pain: Secondary | ICD-10-CM | POA: Diagnosis not present

## 2019-11-28 DIAGNOSIS — M797 Fibromyalgia: Secondary | ICD-10-CM | POA: Diagnosis not present

## 2019-11-28 DIAGNOSIS — J069 Acute upper respiratory infection, unspecified: Secondary | ICD-10-CM | POA: Diagnosis not present

## 2019-11-28 DIAGNOSIS — K219 Gastro-esophageal reflux disease without esophagitis: Secondary | ICD-10-CM | POA: Diagnosis not present

## 2019-11-28 DIAGNOSIS — I10 Essential (primary) hypertension: Secondary | ICD-10-CM | POA: Diagnosis not present

## 2019-12-18 DIAGNOSIS — R32 Unspecified urinary incontinence: Secondary | ICD-10-CM | POA: Diagnosis not present

## 2019-12-18 DIAGNOSIS — K219 Gastro-esophageal reflux disease without esophagitis: Secondary | ICD-10-CM | POA: Diagnosis not present

## 2019-12-18 DIAGNOSIS — H6692 Otitis media, unspecified, left ear: Secondary | ICD-10-CM | POA: Diagnosis not present

## 2019-12-18 DIAGNOSIS — M810 Age-related osteoporosis without current pathological fracture: Secondary | ICD-10-CM | POA: Diagnosis not present

## 2019-12-18 DIAGNOSIS — N189 Chronic kidney disease, unspecified: Secondary | ICD-10-CM | POA: Diagnosis not present

## 2019-12-18 DIAGNOSIS — G8929 Other chronic pain: Secondary | ICD-10-CM | POA: Diagnosis not present

## 2019-12-18 DIAGNOSIS — I10 Essential (primary) hypertension: Secondary | ICD-10-CM | POA: Diagnosis not present

## 2019-12-18 DIAGNOSIS — M797 Fibromyalgia: Secondary | ICD-10-CM | POA: Diagnosis not present

## 2019-12-18 DIAGNOSIS — G4709 Other insomnia: Secondary | ICD-10-CM | POA: Diagnosis not present

## 2019-12-18 DIAGNOSIS — M7061 Trochanteric bursitis, right hip: Secondary | ICD-10-CM | POA: Diagnosis not present

## 2019-12-18 DIAGNOSIS — F411 Generalized anxiety disorder: Secondary | ICD-10-CM | POA: Diagnosis not present

## 2019-12-24 DIAGNOSIS — S42212A Unspecified displaced fracture of surgical neck of left humerus, initial encounter for closed fracture: Secondary | ICD-10-CM | POA: Diagnosis not present

## 2019-12-25 DIAGNOSIS — S42292A Other displaced fracture of upper end of left humerus, initial encounter for closed fracture: Secondary | ICD-10-CM | POA: Diagnosis not present

## 2019-12-26 ENCOUNTER — Other Ambulatory Visit: Payer: Self-pay | Admitting: Orthopedic Surgery

## 2019-12-27 ENCOUNTER — Encounter
Admission: RE | Admit: 2019-12-27 | Discharge: 2019-12-27 | Disposition: A | Discharge status: Home / Self Care | Payer: PPO | Source: Ambulatory Visit | Attending: Orthopedic Surgery | Admitting: Orthopedic Surgery

## 2019-12-27 ENCOUNTER — Encounter: Payer: Self-pay | Admitting: Orthopedic Surgery

## 2019-12-27 ENCOUNTER — Other Ambulatory Visit: Payer: PPO

## 2019-12-27 ENCOUNTER — Other Ambulatory Visit: Payer: Self-pay

## 2019-12-27 DIAGNOSIS — I10 Essential (primary) hypertension: Secondary | ICD-10-CM | POA: Diagnosis not present

## 2019-12-27 DIAGNOSIS — Z01810 Encounter for preprocedural cardiovascular examination: Secondary | ICD-10-CM | POA: Insufficient documentation

## 2019-12-27 DIAGNOSIS — Z01818 Encounter for other preprocedural examination: Secondary | ICD-10-CM | POA: Diagnosis not present

## 2019-12-27 NOTE — Patient Instructions (Signed)
Your procedure is scheduled on: Mon 1/18 Report to Day Surgery[facility]medical mall To find out your arrival time please call [phone] between 1PM - 3PM on Fri 1/15.  Remember: Instructions that are not followed completely may result in serious medical risk,  up to and including death, or upon the discretion of your surgeon and anesthesiologist your  surgery may need to be rescheduled.     _X__ 1. Do not eat food after midnight the night before your procedure.                 No gum chewing or hard candies. You may drink clear liquids up to 2 hours                 before you are scheduled to arrive for your surgery- DO not drink clear                 liquids within 2 hours of the start of your surgery.                 Clear Liquids include:  water, apple juice without pulp, clear carbohydrate                 drink such as Clearfast of Gatorade, Black Coffee or Tea (Do not add                 anything to coffee or tea).  __X__2.  On the morning of surgery brush your teeth with toothpaste and water, you                may rinse your mouth with mouthwash if you wish.  Do not swallow any toothpaste of mouthwash.     _X__ 3.  No Alcohol for 24 hours before or after surgery.   ___ 4.  Do Not Smoke or use e-cigarettes For 24 Hours Prior to Your Surgery.                 Do not use any chewable tobacco products for at least 6 hours prior to                 surgery.  ____  5.  Bring all medications with you on the day of surgery if instructed.   __x__  6.  Notify your doctor if there is any change in your medical condition      (cold, fever, infections).     Do not wear jewelry, make-up, hairpins, clips or nail polish. Do not wear lotions, powders, or perfumes.  Do not shave 48 hours prior to surgery. Men may shave face and neck. Do not bring valuables to the hospital.    [facility] is not responsible for any belongings or valuables.  Contacts, dentures or bridgework  may not be worn into surgery. Leave your suitcase in the car. After surgery it may be brought to your room. For patients admitted to the hospital, discharge time is determined by your treatment team.   Patients discharged the day of surgery will not be allowed to drive home.   Please read over the following fact sheets that you were given:     __x__ Take these medicines the morning of surgery with A SIP OF WATER:    1. acetaminophen (TYLENOL) 500 MG tablet if needed  2. ALPRAZolam (XANAX) 1 MG tablet if needed  3. cyclobenzaprine (FLEXERIL) 10 MG tablet  4.oxyCODONE (OXY IR/ROXICODONE) 5 MG immediate release tablet if needed  5.  6.  ____ Fleet Enema (as directed)   __x__ Use CHG Soap as directed  ____ Use inhalers on the day of surgery  ____ Stop metformin 2 days prior to surgery    ____ Take 1/2 of usual insulin dose the night before surgery. No insulin the morning          of surgery.   ____ Stop Coumadin/Plavix/aspirin on  __x__ Stop Anti-inflammatories on    ____ Stop supplements until after surgery.    ____ Bring C-Pap to the hospital.

## 2019-12-27 NOTE — Patient Instructions (Addendum)
Your procedure is scheduled on: Mon 1/18 Report to Day Surgery.medical mall To find out your arrival time please call [phone] between 1PM - 3PM on Fri 1/15.  Remember: Instructions that are not followed completely may result in serious medical risk,  up to and including death, or upon the discretion of your surgeon and anesthesiologist your  surgery may need to be rescheduled.     _X__ 1. Do not eat food after midnight the night before your procedure.                 No gum chewing or hard candies. You may drink clear liquids up to 2 hours                 before you are scheduled to arrive for your surgery- DO not drink clear                 liquids within 2 hours of the start of your surgery.                 Clear Liquids include:  water, apple juice without pulp, clear carbohydrate                 drink such as Clearfast of Gatorade, Black Coffee or Tea (Do not add                 anything to coffee or tea).  __X__2.  On the morning of surgery brush your teeth with toothpaste and water, you                may rinse your mouth with mouthwash if you wish.  Do not swallow any toothpaste of mouthwash.     _X__ 3.  No Alcohol for 24 hours before or after surgery.   ___ 4.  Do Not Smoke or use e-cigarettes For 24 Hours Prior to Your Surgery.                 Do not use any chewable tobacco products for at least 6 hours prior to                 surgery.  ____  5.  Bring all medications with you on the day of surgery if instructed.   __x__  6.  Notify your doctor if there is any change in your medical condition      (cold, fever, infections).     Do not wear jewelry, make-up, hairpins, clips or nail polish. Do not wear lotions, powders, or perfumes.  Do not shave 48 hours prior to surgery. Men may shave face and neck. Do not bring valuables to the hospital.    [facility] is not responsible for any belongings or valuables.  Contacts, dentures or bridgework may not be worn into  surgery. Leave your suitcase in the car. After surgery it may be brought to your room. For patients admitted to the hospital, discharge time is determined by your treatment team.   Patients discharged the day of surgery will not be allowed to drive home.   Please read over the following fact sheets that you were given:     __x__ Take these medicines the morning of surgery with A SIP OF WATER:    1. acetaminophen (TYLENOL) 500 MG tablet if needed  2. ALPRAZolam (XANAX) 1 MG tablet if needed  3. cyclobenzaprine (FLEXERIL) 10 MG tablet  4.oxyCODONE (OXY IR/ROXICODONE) 5 MG immediate release tablet if needed  5.  6.  ____ Fleet Enema (as directed)   __x__ Use CHG Soap as directed  ____ Use inhalers on the day of surgery  ____ Stop metformin 2 days prior to surgery    ____ Take 1/2 of usual insulin dose the night before surgery. No insulin the morning          of surgery.   ____ Stop Coumadin/Plavix/aspirin on  __x__ Stop Anti-inflammatories ibuprofen aleve or aspirin.  May increase your tylenol   2 500mg  every 6 hours   ____ Stop supplements until after surgery.    ____ Bring C-Pap to the hospital.

## 2019-12-28 ENCOUNTER — Encounter
Admission: RE | Admit: 2019-12-28 | Discharge: 2019-12-28 | Disposition: A | Discharge status: Home / Self Care | Payer: PPO | Source: Ambulatory Visit | Attending: Orthopedic Surgery | Admitting: Orthopedic Surgery

## 2019-12-28 ENCOUNTER — Other Ambulatory Visit: Admission: RE | Admit: 2019-12-28 | Payer: PPO | Source: Ambulatory Visit

## 2019-12-28 DIAGNOSIS — I10 Essential (primary) hypertension: Secondary | ICD-10-CM | POA: Diagnosis not present

## 2019-12-28 DIAGNOSIS — Z01810 Encounter for preprocedural cardiovascular examination: Secondary | ICD-10-CM | POA: Diagnosis not present

## 2019-12-28 LAB — PROTIME-INR
INR: 0.9 (ref 0.8–1.2)
Prothrombin Time: 12.1 s (ref 11.4–15.2)

## 2019-12-28 LAB — BASIC METABOLIC PANEL WITH GFR
Anion gap: 9 (ref 5–15)
BUN: 32 mg/dL — ABNORMAL HIGH (ref 8–23)
CO2: 29 mmol/L (ref 22–32)
Calcium: 8.6 mg/dL — ABNORMAL LOW (ref 8.9–10.3)
Chloride: 103 mmol/L (ref 98–111)
Creatinine, Ser: 1.25 mg/dL — ABNORMAL HIGH (ref 0.44–1.00)
GFR calc Af Amer: 49 mL/min — ABNORMAL LOW
GFR calc non Af Amer: 43 mL/min — ABNORMAL LOW
Glucose, Bld: 94 mg/dL (ref 70–99)
Potassium: 3.8 mmol/L (ref 3.5–5.1)
Sodium: 141 mmol/L (ref 135–145)

## 2019-12-28 LAB — CBC
HCT: 35.9 % — ABNORMAL LOW (ref 36.0–46.0)
Hemoglobin: 11.4 g/dL — ABNORMAL LOW (ref 12.0–15.0)
MCH: 30.1 pg (ref 26.0–34.0)
MCHC: 31.8 g/dL (ref 30.0–36.0)
MCV: 94.7 fL (ref 80.0–100.0)
Platelets: 224 10*3/uL (ref 150–400)
RBC: 3.79 MIL/uL — ABNORMAL LOW (ref 3.87–5.11)
RDW: 14.2 % (ref 11.5–15.5)
WBC: 9.9 10*3/uL (ref 4.0–10.5)
nRBC: 0 % (ref 0.0–0.2)

## 2019-12-28 LAB — APTT: aPTT: 32 s (ref 24–36)

## 2019-12-29 LAB — SARS CORONAVIRUS 2 (TAT 6-24 HRS): SARS Coronavirus 2: NEGATIVE

## 2019-12-31 ENCOUNTER — Encounter: Payer: Self-pay | Admitting: Certified Registered"

## 2019-12-31 ENCOUNTER — Ambulatory Visit: Payer: PPO

## 2019-12-31 ENCOUNTER — Ambulatory Visit: Payer: Self-pay | Admitting: Certified Registered"

## 2019-12-31 ENCOUNTER — Encounter
Admission: RE | Disposition: A | Discharge status: Home / Self Care | Payer: Self-pay | Source: Home / Self Care | Attending: Orthopedic Surgery

## 2019-12-31 ENCOUNTER — Ambulatory Visit: Payer: Self-pay

## 2019-12-31 ENCOUNTER — Other Ambulatory Visit: Payer: Self-pay

## 2019-12-31 ENCOUNTER — Ambulatory Visit
Admission: RE | Admit: 2019-12-31 | Discharge: 2019-12-31 | Disposition: A | Discharge status: Home / Self Care | Payer: PPO | Attending: Orthopedic Surgery | Admitting: Orthopedic Surgery

## 2019-12-31 DIAGNOSIS — M25519 Pain in unspecified shoulder: Secondary | ICD-10-CM

## 2019-12-31 DIAGNOSIS — M25512 Pain in left shoulder: Secondary | ICD-10-CM | POA: Diagnosis not present

## 2019-12-31 DIAGNOSIS — F418 Other specified anxiety disorders: Secondary | ICD-10-CM | POA: Diagnosis not present

## 2019-12-31 DIAGNOSIS — S42292D Other displaced fracture of upper end of left humerus, subsequent encounter for fracture with routine healing: Secondary | ICD-10-CM | POA: Diagnosis not present

## 2019-12-31 DIAGNOSIS — X58XXXA Exposure to other specified factors, initial encounter: Secondary | ICD-10-CM | POA: Insufficient documentation

## 2019-12-31 DIAGNOSIS — S42292A Other displaced fracture of upper end of left humerus, initial encounter for closed fracture: Secondary | ICD-10-CM | POA: Insufficient documentation

## 2019-12-31 DIAGNOSIS — K219 Gastro-esophageal reflux disease without esophagitis: Secondary | ICD-10-CM | POA: Diagnosis not present

## 2019-12-31 DIAGNOSIS — I10 Essential (primary) hypertension: Secondary | ICD-10-CM | POA: Diagnosis not present

## 2019-12-31 DIAGNOSIS — J449 Chronic obstructive pulmonary disease, unspecified: Secondary | ICD-10-CM | POA: Diagnosis not present

## 2019-12-31 DIAGNOSIS — T148XXA Other injury of unspecified body region, initial encounter: Secondary | ICD-10-CM

## 2019-12-31 DIAGNOSIS — G8918 Other acute postprocedural pain: Secondary | ICD-10-CM | POA: Diagnosis not present

## 2019-12-31 HISTORY — DX: Personal history of urinary calculi: Z87.442

## 2019-12-31 HISTORY — PX: HUMERUS IM NAIL: SHX1769

## 2019-12-31 LAB — URINALYSIS, ROUTINE W REFLEX MICROSCOPIC
Bacteria, UA: NONE SEEN
Bilirubin Urine: NEGATIVE
Glucose, UA: NEGATIVE mg/dL
Hgb urine dipstick: NEGATIVE
Ketones, ur: NEGATIVE mg/dL
Nitrite: NEGATIVE
Protein, ur: NEGATIVE mg/dL
Specific Gravity, Urine: 1.018 (ref 1.005–1.030)
pH: 5 (ref 5.0–8.0)

## 2019-12-31 SURGERY — INSERTION, INTRAMEDULLARY ROD, HUMERUS
Anesthesia: General | Site: Shoulder | Laterality: Left

## 2019-12-31 MED ORDER — OXYCODONE HCL 5 MG PO TABS
5.0000 mg | ORAL_TABLET | ORAL | Status: DC | PRN
  Filled 2019-12-31: qty 2

## 2019-12-31 MED ORDER — BUPIVACAINE LIPOSOME 1.3 % IJ SUSP
INTRAMUSCULAR | Status: DC | PRN
  Administered 2019-12-31: 20 mL via PERINEURAL

## 2019-12-31 MED ORDER — FAMOTIDINE 20 MG PO TABS: 20.0000 mg | ORAL_TABLET | Freq: Once | ORAL | Status: AC

## 2019-12-31 MED ORDER — FENTANYL CITRATE (PF) 100 MCG/2ML IJ SOLN: 50.0000 ug | Freq: Once | INTRAMUSCULAR | Status: AC

## 2019-12-31 MED ORDER — METOCLOPRAMIDE HCL 5 MG/ML IJ SOLN: 5.0000 mg | Freq: Three times a day (TID) | INTRAMUSCULAR | Status: DC | PRN

## 2019-12-31 MED ORDER — SODIUM CHLORIDE 0.9 % IR SOLN: Status: DC | PRN

## 2019-12-31 MED ORDER — METOCLOPRAMIDE HCL 10 MG PO TABS: 5.0000 mg | ORAL_TABLET | Freq: Three times a day (TID) | ORAL | Status: DC | PRN

## 2019-12-31 MED ORDER — CLINDAMYCIN PHOSPHATE 900 MG/50ML IV SOLN
900.0000 mg | INTRAVENOUS | Status: AC
  Administered 2019-12-31: 900 mg via INTRAVENOUS

## 2019-12-31 MED ORDER — LACTATED RINGERS IV SOLN: INTRAVENOUS | Status: DC

## 2019-12-31 MED ORDER — EPHEDRINE SULFATE 50 MG/ML IJ SOLN
INTRAMUSCULAR | Status: DC | PRN
  Administered 2019-12-31 (×3): 10 mg via INTRAVENOUS

## 2019-12-31 MED ORDER — ONDANSETRON HCL 4 MG/2ML IJ SOLN
INTRAMUSCULAR | Status: AC
  Filled 2019-12-31: qty 2

## 2019-12-31 MED ORDER — SUGAMMADEX SODIUM 200 MG/2ML IV SOLN
INTRAVENOUS | Status: AC
  Filled 2019-12-31: qty 2

## 2019-12-31 MED ORDER — PROPOFOL 10 MG/ML IV BOLUS
INTRAVENOUS | Status: DC | PRN
  Administered 2019-12-31: 110 mg via INTRAVENOUS

## 2019-12-31 MED ORDER — PROPOFOL 10 MG/ML IV BOLUS
INTRAVENOUS | Status: AC
  Filled 2019-12-31: qty 20

## 2019-12-31 MED ORDER — FAMOTIDINE 20 MG PO TABS
ORAL_TABLET | ORAL | Status: AC
  Administered 2019-12-31: 20 mg via ORAL
  Filled 2019-12-31: qty 1

## 2019-12-31 MED ORDER — PHENYLEPHRINE HCL (PRESSORS) 10 MG/ML IV SOLN
INTRAVENOUS | Status: DC | PRN
  Administered 2019-12-31 (×4): 100 ug via INTRAVENOUS

## 2019-12-31 MED ORDER — FENTANYL CITRATE (PF) 100 MCG/2ML IJ SOLN
INTRAMUSCULAR | Status: AC
  Administered 2019-12-31: 50 ug via INTRAVENOUS
  Filled 2019-12-31: qty 2

## 2019-12-31 MED ORDER — CHLORHEXIDINE GLUCONATE 4 % EX LIQD: 60.0000 mL | Freq: Once | CUTANEOUS | Status: DC

## 2019-12-31 MED ORDER — FENTANYL CITRATE (PF) 100 MCG/2ML IJ SOLN: 25.0000 ug | INTRAMUSCULAR | Status: DC | PRN

## 2019-12-31 MED ORDER — ONDANSETRON HCL 4 MG/2ML IJ SOLN: 4.0000 mg | Freq: Once | INTRAMUSCULAR | Status: DC | PRN

## 2019-12-31 MED ORDER — MIDAZOLAM HCL 2 MG/2ML IJ SOLN: 1.0000 mg | Freq: Once | INTRAMUSCULAR | Status: AC

## 2019-12-31 MED ORDER — SODIUM CHLORIDE 0.9 % IV SOLN
INTRAVENOUS | Status: DC | PRN
  Administered 2019-12-31: 50 ug/min via INTRAVENOUS

## 2019-12-31 MED ORDER — LIDOCAINE HCL (CARDIAC) PF 100 MG/5ML IV SOSY
PREFILLED_SYRINGE | INTRAVENOUS | Status: DC | PRN
  Administered 2019-12-31: 60 mg via INTRAVENOUS

## 2019-12-31 MED ORDER — HYDROMORPHONE HCL 1 MG/ML IJ SOLN: 0.5000 mg | INTRAMUSCULAR | Status: DC | PRN

## 2019-12-31 MED ORDER — BUPIVACAINE HCL (PF) 0.5 % IJ SOLN
INTRAMUSCULAR | Status: DC | PRN
  Administered 2019-12-31: 10 mL via PERINEURAL

## 2019-12-31 MED ORDER — OXYCODONE HCL 5 MG PO TABS
10.0000 mg | ORAL_TABLET | ORAL | Status: DC | PRN
  Filled 2019-12-31: qty 3

## 2019-12-31 MED ORDER — EPHEDRINE SULFATE 50 MG/ML IJ SOLN
INTRAMUSCULAR | Status: AC
  Filled 2019-12-31: qty 1

## 2019-12-31 MED ORDER — DEXAMETHASONE SODIUM PHOSPHATE 10 MG/ML IJ SOLN
INTRAMUSCULAR | Status: AC
  Filled 2019-12-31: qty 1

## 2019-12-31 MED ORDER — SUGAMMADEX SODIUM 200 MG/2ML IV SOLN
INTRAVENOUS | Status: DC | PRN
  Administered 2019-12-31: 100 mg via INTRAVENOUS

## 2019-12-31 MED ORDER — FENTANYL CITRATE (PF) 100 MCG/2ML IJ SOLN
INTRAMUSCULAR | Status: DC | PRN
  Administered 2019-12-31: 50 ug via INTRAVENOUS

## 2019-12-31 MED ORDER — LIDOCAINE HCL (PF) 1 % IJ SOLN
INTRAMUSCULAR | Status: DC | PRN
  Administered 2019-12-31: 3 mL via SUBCUTANEOUS

## 2019-12-31 MED ORDER — ONDANSETRON HCL 4 MG PO TABS: 4.0000 mg | ORAL_TABLET | Freq: Four times a day (QID) | ORAL | Status: DC | PRN

## 2019-12-31 MED ORDER — BUPIVACAINE HCL (PF) 0.5 % IJ SOLN
INTRAMUSCULAR | Status: AC
  Filled 2019-12-31: qty 10

## 2019-12-31 MED ORDER — FENTANYL CITRATE (PF) 100 MCG/2ML IJ SOLN
INTRAMUSCULAR | Status: AC
  Filled 2019-12-31: qty 2

## 2019-12-31 MED ORDER — ONDANSETRON HCL 4 MG/2ML IJ SOLN: 4.0000 mg | Freq: Four times a day (QID) | INTRAMUSCULAR | Status: DC | PRN

## 2019-12-31 MED ORDER — KETOROLAC TROMETHAMINE 15 MG/ML IJ SOLN: 15.0000 mg | Freq: Four times a day (QID) | INTRAMUSCULAR | Status: DC

## 2019-12-31 MED ORDER — CLINDAMYCIN PHOSPHATE 900 MG/50ML IV SOLN
INTRAVENOUS | Status: AC
  Filled 2019-12-31: qty 50

## 2019-12-31 MED ORDER — BUPIVACAINE-EPINEPHRINE 0.25% -1:200000 IJ SOLN
INTRAMUSCULAR | Status: DC | PRN
  Administered 2019-12-31: 8 mL

## 2019-12-31 MED ORDER — ROCURONIUM BROMIDE 50 MG/5ML IV SOLN
INTRAVENOUS | Status: AC
  Filled 2019-12-31: qty 1

## 2019-12-31 MED ORDER — LIDOCAINE HCL (PF) 2 % IJ SOLN
INTRAMUSCULAR | Status: AC
  Filled 2019-12-31: qty 5

## 2019-12-31 MED ORDER — LIDOCAINE HCL (PF) 1 % IJ SOLN
INTRAMUSCULAR | Status: AC
  Filled 2019-12-31: qty 5

## 2019-12-31 MED ORDER — ROCURONIUM BROMIDE 100 MG/10ML IV SOLN
INTRAVENOUS | Status: DC | PRN
  Administered 2019-12-31: 45 mg via INTRAVENOUS

## 2019-12-31 MED ORDER — ONDANSETRON HCL 4 MG/2ML IJ SOLN
INTRAMUSCULAR | Status: DC | PRN
  Administered 2019-12-31: 4 mg via INTRAVENOUS

## 2019-12-31 MED ORDER — PHENYLEPHRINE HCL (PRESSORS) 10 MG/ML IV SOLN
INTRAVENOUS | Status: AC
  Filled 2019-12-31: qty 1

## 2019-12-31 MED ORDER — ACETAMINOPHEN 325 MG PO TABS: 325.0000 mg | ORAL_TABLET | Freq: Four times a day (QID) | ORAL | Status: DC | PRN

## 2019-12-31 MED ORDER — DEXAMETHASONE SODIUM PHOSPHATE 10 MG/ML IJ SOLN
INTRAMUSCULAR | Status: DC | PRN
  Administered 2019-12-31: 10 mg via INTRAVENOUS

## 2019-12-31 MED ORDER — BUPIVACAINE LIPOSOME 1.3 % IJ SUSP
INTRAMUSCULAR | Status: AC
  Filled 2019-12-31: qty 20

## 2019-12-31 MED ORDER — MIDAZOLAM HCL 2 MG/2ML IJ SOLN
INTRAMUSCULAR | Status: AC
  Administered 2019-12-31: 13:00:00 1 mg via INTRAVENOUS
  Filled 2019-12-31: qty 2

## 2019-12-31 SURGICAL SUPPLY — 65 items
BIT DRILL 10 HOLLOW (BIT) ×3 IMPLANT
BIT DRILL 3.8MMX270MM (BIT) ×1
BIT DRILL 3.8X270 (BIT) ×2 IMPLANT
BIT DRILL 6.5 CONICAL (BIT) ×2 IMPLANT
BIT DRILL 6.5MM CONICAL (BIT) ×1
BIT DRILL CAL 3.2 LONG (BIT) ×2 IMPLANT
BIT DRILL CAL 3.2MM LONG (BIT) ×1
BRUSH SCRUB EZ  4% CHG (MISCELLANEOUS) ×4
BRUSH SCRUB EZ 4% CHG (MISCELLANEOUS) ×2 IMPLANT
CANISTER SUCT 1200ML W/VALVE (MISCELLANEOUS) ×3 IMPLANT
CHLORAPREP W/TINT 26 (MISCELLANEOUS) ×3 IMPLANT
COVER WAND RF STERILE (DRAPES) ×3 IMPLANT
CRADLE LAMINECT ARM (MISCELLANEOUS) ×3 IMPLANT
DECANTER SPIKE VIAL GLASS SM (MISCELLANEOUS) ×6 IMPLANT
DRAPE C-ARM XRAY 36X54 (DRAPES) ×9 IMPLANT
DRAPE C-ARMOR (DRAPES) ×3 IMPLANT
DRAPE IMP U-DRAPE 54X76 (DRAPES) ×6 IMPLANT
DRAPE SPLIT 6X30 W/TAPE (DRAPES) ×3 IMPLANT
DRAPE U-SHAPE 47X51 STRL (DRAPES) ×6 IMPLANT
DRSG AQUACEL AG ADV 3.5X 4 (GAUZE/BANDAGES/DRESSINGS) ×9 IMPLANT
DRSG TEGADERM 2-3/8X2-3/4 SM (GAUZE/BANDAGES/DRESSINGS) ×6 IMPLANT
DRSG TEGADERM 6X8 (GAUZE/BANDAGES/DRESSINGS) ×6 IMPLANT
ELECT REM PT RETURN 9FT ADLT (ELECTROSURGICAL) ×3
ELECTRODE REM PT RTRN 9FT ADLT (ELECTROSURGICAL) ×1 IMPLANT
GAUZE SPONGE 4X4 12PLY STRL (GAUZE/BANDAGES/DRESSINGS) ×3 IMPLANT
GAUZE XEROFORM 1X8 LF (GAUZE/BANDAGES/DRESSINGS) ×3 IMPLANT
GLOVE INDICATOR 8.0 STRL GRN (GLOVE) ×3 IMPLANT
GLOVE SURG ORTHO 8.0 STRL STRW (GLOVE) ×6 IMPLANT
GOWN STRL REUS W/ TWL LRG LVL3 (GOWN DISPOSABLE) ×2 IMPLANT
GOWN STRL REUS W/ TWL XL LVL3 (GOWN DISPOSABLE) ×1 IMPLANT
GOWN STRL REUS W/TWL LRG LVL3 (GOWN DISPOSABLE) ×4
GOWN STRL REUS W/TWL XL LVL3 (GOWN DISPOSABLE) ×2
GUIDEROD SS 2.5MMX280MM (ROD) ×3 IMPLANT
HEMOVAC 400ML (MISCELLANEOUS)
K-WIRE TROCAR POINT 2.5X280 (WIRE) ×6
KIT DRAIN HEMOVAC JP 7FR 400ML (MISCELLANEOUS) IMPLANT
KIT STABILIZATION SHOULDER (MISCELLANEOUS) ×3 IMPLANT
KIT TURNOVER KIT A (KITS) ×3 IMPLANT
KWIRE TROCAR POINT 2.5X280 (WIRE) ×2 IMPLANT
MASK FACE SPIDER DISP (MASK) ×3 IMPLANT
MAT ABSORB  FLUID 56X50 GRAY (MISCELLANEOUS) ×2
MAT ABSORB FLUID 56X50 GRAY (MISCELLANEOUS) ×1 IMPLANT
NAIL CANN 8MM 160 LEFT (Plate) ×3 IMPLANT
NS IRRIG 1000ML POUR BTL (IV SOLUTION) ×3 IMPLANT
PACK ARTHROSCOPY SHOULDER (MISCELLANEOUS) ×3 IMPLANT
PAD ABD DERMACEA PRESS 5X9 (GAUZE/BANDAGES/DRESSINGS) ×3 IMPLANT
PENCIL ELECTRO HAND CTR (MISCELLANEOUS) ×3 IMPLANT
ROD REAMING 2.5 (INSTRUMENTS) ×3 IMPLANT
SCREW LOCK 4.5X40 (Screw) ×2 IMPLANT
SCREW LOCK 4X24 TI (Screw) ×3 IMPLANT
SCREW LOCK FT 40X4.5X3.9XSLF (Screw) ×1 IMPLANT
SCREW LOCK MULTILOC FT 4.5X30 (Screw) ×6 IMPLANT
SLING ARM LRG DEEP (SOFTGOODS) ×3 IMPLANT
SPONGE LAP 18X18 RF (DISPOSABLE) ×3 IMPLANT
STAPLER SKIN PROX 35W (STAPLE) ×3 IMPLANT
STRAP SAFETY 5IN WIDE (MISCELLANEOUS) ×3 IMPLANT
SUT ETHILON 4-0 (SUTURE) ×4
SUT ETHILON 4-0 FS2 18XMFL BLK (SUTURE) ×2
SUT VIC AB 0 CT1 36 (SUTURE) ×3 IMPLANT
SUT VIC AB 1 CT1 18XCR BRD 8 (SUTURE) ×1 IMPLANT
SUT VIC AB 1 CT1 8-18 (SUTURE) ×2
SUT VIC AB 2-0 CT1 18 (SUTURE) ×3 IMPLANT
SUT VIC AB 2-0 CT2 27 (SUTURE) ×3 IMPLANT
SUTURE ETHLN 4-0 FS2 18XMF BLK (SUTURE) ×2 IMPLANT
TAPE TRANSPORE STRL 2 31045 (GAUZE/BANDAGES/DRESSINGS) ×3 IMPLANT

## 2019-12-31 NOTE — Anesthesia Procedure Notes (Signed)
Anesthesia Regional Block: Interscalene brachial plexus block   Pre-Anesthetic Checklist: ,, timeout performed, Correct Patient, Correct Site, Correct Laterality, Correct Procedure, Correct Position, site marked, Risks and benefits discussed,  Surgical consent,  Pre-op evaluation,  At surgeon's request and post-op pain management  Laterality: Left  Prep: chloraprep       Needles:  Injection technique: Single-shot  Needle Type: Echogenic Stimulator Needle     Needle Length: 10cm  Needle Gauge: 20     Additional Needles:   Procedures:, nerve stimulator,,, ultrasound used (permanent image in chart),,,,   Nerve Stimulator or Paresthesia:  Response: biceps flexion,   Additional Responses:   Narrative:  Start time: 12/31/2019 1:31 PM End time: 12/31/2019 1:38 PM Injection made incrementally with aspirations every 5 mL.  Performed by: Personally   Additional Notes: Functioning IV was confirmed and monitors were applied. Sterile prep and drape,hand hygiene and sterile gloves were used.  Negative aspiration and negative test dose prior to incremental administration of local anesthetic. The patient tolerated the procedure well.

## 2019-12-31 NOTE — Op Note (Signed)
12/31/2019  3:39 PM  PATIENT:  Donna Evans    PRE-OPERATIVE DIAGNOSIS:  [id number] other displaced fracture of upper end of left humerus, initial ecnounter for closed fracture  POST-OPERATIVE DIAGNOSIS:  Same  PROCEDURE:  INTRAMEDULLARY (IM) NAIL HUMERAL, LEFT  SURGEON:  Lovell Sheehan, MD   ASSIST: Carlynn Spry, PA-C  ANESTHESIA:   General Endotracheal and Regional Block  PREOPERATIVE INDICATIONS:  Donna Evans is a  74 y.o. female with a diagnosis of S42.292A other displaced fracture of upper end of left humerus, initial ecnounter for closed fracture who failed conservative measures and elected for surgical management.    The risks benefits and alternatives were discussed with the patient preoperatively including but not limited to the risks of infection, bleeding, nerve injury, cardiopulmonary complications, the need for revision surgery, among others, and the patient was willing to proceed.  EBL: 10 mL  TOURNIQUET TIME:  None used  OPERATIVE IMPLANTS: synthes humeral nail, 8 mm diameter, multi-lock  OPERATIVE FINDINGS: Closed, displaced, angulated fracture of the proximal humerus       OPERATIVE PROCEDURE:   The patient was brought to the operating room and underwent satisfactory general endotracheal anesthesia in the supine position. The bed was repositioned to the beachchair position. The operative arm was prepped and draped in a sterile fashion. 1/2% Sensorcaine with epinephrine was placed in the proximal and distal incisional areas. A short saber incision was made off the edge of the acromion and dissection carried out bluntly through subcutaneous tissue. Muscle was split bluntly at the anterior edge of the acromium and bursectomy carried out. An incision was made in the supraspinatus cuff tissue and a guidepin inserted. Fluoroscopy showed the guide pin to be in satisfactory position with a good entry point. A reamer was used to enlarge the opening in the  humerus. The ball tip guide was advanced down the humerus to pass across the fracture. Fluoroscopy showed the guide pin to be in good position. The synthes short nail was introduced and seated completely and slightly countersunk. Three screws were introduced through a small stab wound lateral to medial fixing the rod proximally. The distal hole in the nail was located by the guide and a small incision made over this. Blunt dissection was carried out through muscle down to bone. Using fluoroscopy, the hole was drilled and a distal screw was introduced and seated snugly. Fluoroscopy showed the hardware to be in good position and the fracture be well aligned. The wounds were irrigated and the rotator cuff and deltoid closed with 0 Vicryl suture. Subcutaneous tissues were closed with 0 and 2-0 Vicryl. Remainder of the Sensorcaine was injected. Dry sterile dressings and a sling were applied. Sponge and needle counts were correct. Patient was awakened and taken to recovery in good condition.   Elyn Aquas. Harlow Mares, MD

## 2019-12-31 NOTE — Anesthesia Procedure Notes (Signed)
Procedure Name: Intubation Date/Time: 12/31/2019 1:55 PM Performed by: Esaw Grandchild, CRNA Pre-anesthesia Checklist: Patient identified, Emergency Drugs available, Suction available and Patient being monitored Patient Re-evaluated:Patient Re-evaluated prior to induction Oxygen Delivery Method: Circle system utilized Preoxygenation: Pre-oxygenation with 100% oxygen Induction Type: IV induction Ventilation: Mask ventilation without difficulty Laryngoscope Size: Miller and 2 Grade View: Grade I Tube type: Oral Tube size: 7.0 mm Number of attempts: 1 Airway Equipment and Method: Stylet,  Oral airway,  Bite block and LTA kit utilized Placement Confirmation: ETT inserted through vocal cords under direct vision,  positive ETCO2 and breath sounds checked- equal and bilateral Secured at: 20 cm Tube secured with: Tape Dental Injury: Teeth and Oropharynx as per pre-operative assessment

## 2019-12-31 NOTE — Discharge Instructions (Addendum)
Wear sling at all times, including sleep.  You will need to use the sling for a total of 2 weeks following surgery.  Do not try and lift your arm up or away from your body for any reason.   Keep the dressing dry.  You may remove bandage in 3 days.  You may place Band-Aids over top of the incisions.  May shower once dressing is removed in 3 days.  Remove sling carefully only for showers, leaving arm down by your side while in the shower.  +++ Make sure to take some pain medication this evening before you fall asleep, in preparation for the nerve block wearing off in the middle of the night.  If the the pain medication causes itching, or is too strong, try taking a single tablet at a time, or combining with Benadryl.  You may be most comfortable sleeping in a recliner.  If you do sleep in near bed, placed pillows behind the shoulder that have the operation to support it.   AMBULATORY SURGERY  DISCHARGE INSTRUCTIONS   1) The drugs that you were given will stay in your system until tomorrow so for the next 24 hours you should not:  A) Drive an automobile B) Make any legal decisions C) Drink any alcoholic beverage   2) You may resume regular meals tomorrow.  Today it is better to start with liquids and gradually work up to solid foods.  You may eat anything you prefer, but it is better to start with liquids, then soup and crackers, and gradually work up to solid foods.   3) Please notify your doctor immediately if you have any unusual bleeding, trouble breathing, redness and pain at the surgery site, drainage, fever, or pain not relieved by medication.    4) Additional Instructions:        Please contact your physician with any problems or Same Day Surgery at [phone], Monday through Friday 6 am to 4 pm, or [place] at [facility] number at [phone].      Interscalene Nerve Block with Exparel  1.  For your surgery you have received an Interscalene Nerve Block  with Exparel. 2. Nerve Blocks affect many types of nerves, including nerves that control movement, pain and normal sensation.  You may experience feelings such as numbness, tingling, heaviness, weakness or the inability to move your arm or the feeling or sensation that your arm has "fallen asleep". 3. A nerve block with Exparel can last up to 5 days.  Usually the weakness wears off first.  The tingling and heaviness usually wear off next.  Finally you may start to notice pain.  Keep in mind that this may occur in any order.  Once a nerve block starts to wear off it is usually completely gone within 60 minutes. 4. ISNB may cause mild shortness of breath, a hoarse voice, blurry vision, unequal pupils, or drooping of the face on the same side as the nerve block.  These symptoms will usually resolve with the numbness.  Very rarely the procedure itself can cause mild seizures. 5. If needed, your surgeon will give you a prescription for pain medication.  It will take about 60 minutes for the oral pain medication to become fully effective.  So, it is recommended that you start taking this medication before the nerve block first begins to wear off, or when you first begin to feel discomfort. 6. Take your pain medication only as prescribed.  Pain medication can cause sedation  and decrease your breathing if you take more than you need for the level of pain that you have. 7. Nausea is a common side effect of many pain medications.  You may want to eat something before taking your pain medicine to prevent nausea. 8. After an Interscalene nerve block, you cannot feel pain, pressure or extremes in temperature in the effected arm.  Because your arm is numb it is at an increased risk for injury.  To decrease the possibility of injury, please practice the following:  a. While you are awake change the position of your arm frequently to prevent too much pressure on any one area for prolonged periods of time. b.  If you have  a cast or tight dressing, check the color or your fingers every couple of hours.  Call your surgeon with the appearance of any discoloration (white or blue). c. If you are given a sling to wear before you go home, please wear it  at all times until the block has completely worn off.  Do not get up at night without your sling. d. Please contact [facility] or your surgeon if you do not begin to regain sensation after 7 days from the surgery.  Anesthesia may be contacted by calling the Same Day Surgery Department, Mon. through Fri., 6 am to 4 pm at [phone].   e. If you experience any other problems or concerns, please contact your surgeon's office. f. If you experience severe or prolonged shortness of breath go to the nearest emergency department.

## 2019-12-31 NOTE — Transfer of Care (Signed)
Immediate Anesthesia Transfer of Care Note  Patient: Donna Evans  Procedure(s) Performed: INTRAMEDULLARY (IM) NAIL HUMERAL (Left Shoulder)  Patient Location: PACU  Anesthesia Type:General and Regional  Level of Consciousness: awake and alert   Airway & Oxygen Therapy: Patient Spontanous Breathing and Patient connected to face mask oxygen  Post-op Assessment: Report given to RN and Post -op Vital signs reviewed and stable  Post vital signs: Reviewed and stable  Last Vitals:  Vitals Value Taken Time  BP 152/82 12/31/19 1542  Temp 36.3 C 12/31/19 1542  Pulse 70 12/31/19 1548  Resp 22 12/31/19 1548  SpO2 100 % 12/31/19 1548  Vitals shown include unvalidated device data.  Last Pain:  Vitals:   12/31/19 1244  TempSrc: Oral  PainSc: 9          Complications: No apparent anesthesia complications

## 2019-12-31 NOTE — H&P (Signed)
The patient has been re-examined, and the chart reviewed, and there have been no interval changes to the documented history and physical.  Plan a left humeral nail today.  Anesthesia is consulted regarding a peripheral nerve block for post-operative pain.  The risks, benefits, and alternatives have been discussed at length, and the patient is willing to proceed.

## 2019-12-31 NOTE — Anesthesia Preprocedure Evaluation (Addendum)
Anesthesia Evaluation  Patient identified by MRN, date of birth, ID band Patient awake    Reviewed: Allergy & Precautions, H&P , NPO status , Patient's Chart, lab work & pertinent test results, reviewed documented beta blocker date and time   Airway Mallampati: II  TM Distance: >3 FB Neck ROM: full    Dental  (+) Teeth Intact   Pulmonary COPD,  COPD inhaler, former smoker,    Pulmonary exam normal        Cardiovascular Exercise Tolerance: Good hypertension, On Medications + angina with exertion Normal cardiovascular exam Rhythm:regular Rate:Normal     Neuro/Psych  Headaches, PSYCHIATRIC DISORDERS Anxiety Depression TIA Neuromuscular disease CVA    GI/Hepatic negative GI ROS, Neg liver ROS, Medicated,  Endo/Other  negative endocrine ROS  Renal/GU Renal disease  negative genitourinary   Musculoskeletal  (+) Arthritis , Fibromyalgia -  Abdominal   Peds  Hematology negative hematology ROS (+)   Anesthesia Other Findings Past Medical History: No date: Allergic state No date: Anginal pain ([place])     Comment:  Prinzmetal's angina No date: Anxiety No date: Arthritis     Comment:  osteoarthritis 1990: Breast cancer ([place])     Comment:  right breast cancer No date: Chronic pain No date: Chronic pain No date: COPD (chronic obstructive pulmonary disease) (HCC) No date: Depression No date: Edema No date: Fibromyalgia No date: Foot fracture     Comment:  Bilateral No date: GERD (gastroesophageal reflux disease) No date: History of kidney stones No date: Hypertension No date: Leg fracture, right No date: Low back pain No date: Lumbosacral neuritis No date: Medulloadrenal hyperfunction (HCC) No date: Migraine headache No date: Peripheral neuropathy No date: Shoulder fracture, right No date: Stroke [facility])     Comment:  TIA No date: Systemic lupus erythematosus ([place]) No date: Thyroid disease 11/28/2013: TIA  (transient ischemic attack) 2011: Transient global amnesia Past Surgical History: 1981: ABDOMINAL HYSTERECTOMY 1957: APPENDECTOMY 1990: AUGMENTATION MAMMAPLASTY; Bilateral     Comment:  saline submuscular 1989: BACK SURGERY 1990: BILATERAL TOTAL MASTECTOMY WITH AXILLARY LYMPH NODE DISSECTION 05/11/2016: BREAST IMPLANT EXCHANGE; Bilateral     Comment:  Procedure: REMOVAL AND REPLACEMENT OF BREAST IMPLANTS ;               Surgeon: Irene Limbo, MD;  Location: [place];  Service: Plastics;  Laterality:               Bilateral; 05/11/2016: CAPSULECTOMY; Bilateral     Comment:  Procedure: CAPSULECTOMY CAPSULORRAPHY ;  Surgeon: Irene Limbo, MD;  Location: [place];                Service: Plastics;  Laterality: Bilateral; 12/26/2018: CATARACT EXTRACTION W/PHACO; Right     Comment:  Procedure: CATARACT EXTRACTION PHACO AND INTRAOCULAR               LENS PLACEMENT ([place]) RIGHT;  Surgeon: Birder Robson,               MD;  Location: [facility];  Service: Ophthalmology;                Laterality: Right;  [place]  00:46 CDE 8.50 Fluid pack lot #              [id number] 01/23/2019: CATARACT EXTRACTION W/PHACO; Left  Comment:  Procedure: CATARACT EXTRACTION PHACO AND INTRAOCULAR               LENS PLACEMENT ([place]) LEFT;  Surgeon: Birder Robson,               MD;  Location: [facility];  Service: Ophthalmology;                Laterality: Left;  [place]  00:45 CDE  8.41 fluid pack lot #              [id number] 1979: CHOLECYSTECTOMY 01/25/2018: COLONOSCOPY WITH PROPOFOL; N/A     Comment:  Procedure: COLONOSCOPY WITH PROPOFOL;  Surgeon: Manya Silvas, MD;  Location: [facility] ENDOSCOPY;  Service:               Endoscopy;  Laterality: N/A; No date: COPD 03/07/2018: ESOPHAGOGASTRODUODENOSCOPY (EGD) WITH PROPOFOL; N/A     Comment:  Procedure: ESOPHAGOGASTRODUODENOSCOPY (EGD) WITH               PROPOFOL;  Surgeon: Manya Silvas, MD;  Location:               [facility] ENDOSCOPY;  Service: Endoscopy;  Laterality: N/A; No date: FRACTURE SURGERY 05/11/2016: LIPOSUCTION; Right     Comment:  Procedure: LIPOSUCTION;  Surgeon: Irene Limbo, MD;               Location: [place];  Service: Plastics;               Laterality: Right; 2011: LIVER BIOPSY 1990: MASTECTOMY SUBCUTANEOUS; Bilateral 05/11/2016: MASTOPEXY; Bilateral     Comment:  Procedure: MASTOPEXY BILATERAL ;  Surgeon: Irene Limbo, MD;  Location: [place];                Service: Plastics;  Laterality: Bilateral; BMI    Body Mass Index: 21.30 kg/m     Reproductive/Obstetrics negative OB ROS                            Anesthesia Physical Anesthesia Plan  ASA: III  Anesthesia Plan: General ETT   Post-op Pain Management:  Regional for Post-op pain   Induction:   PONV Risk Score and Plan: 4 or greater  Airway Management Planned:   Additional Equipment:   Intra-op Plan:   Post-operative Plan:   Informed Consent: I have reviewed the patients History and Physical, chart, labs and discussed the procedure including the risks, benefits and alternatives for the proposed anesthesia with the patient or authorized representative who has indicated his/her understanding and acceptance.     Dental Advisory Given  Plan Discussed with: CRNA  Anesthesia Plan Comments:        Anesthesia Quick Evaluation

## 2020-01-02 NOTE — Anesthesia Postprocedure Evaluation (Signed)
Anesthesia Post Note  Patient: Donna Evans  Procedure(s) Performed: INTRAMEDULLARY (IM) NAIL HUMERAL (Left Shoulder)  Patient location during evaluation: PACU Anesthesia Type: General Level of consciousness: awake and alert Pain management: pain level controlled Vital Signs Assessment: post-procedure vital signs reviewed and stable Respiratory status: spontaneous breathing, nonlabored ventilation, respiratory function stable and patient connected to nasal cannula oxygen Cardiovascular status: blood pressure returned to baseline and stable Postop Assessment: no apparent nausea or vomiting Anesthetic complications: no     Last Vitals:  Vitals:   12/31/19 1745 12/31/19 1812  BP: (!) 142/82 137/82  Pulse: 72 76  Resp:    Temp: 36.8 C   SpO2: 94% 95%    Last Pain:  Vitals:   01/01/20 0931  TempSrc:   PainSc: 0-No pain                 Martha Clan

## 2020-01-04 DIAGNOSIS — M7542 Impingement syndrome of left shoulder: Secondary | ICD-10-CM | POA: Diagnosis not present

## 2020-01-04 DIAGNOSIS — M7550 Bursitis of unspecified shoulder: Secondary | ICD-10-CM | POA: Diagnosis not present

## 2020-01-04 DIAGNOSIS — D649 Anemia, unspecified: Secondary | ICD-10-CM | POA: Diagnosis not present

## 2020-01-04 DIAGNOSIS — M179 Osteoarthritis of knee, unspecified: Secondary | ICD-10-CM | POA: Diagnosis not present

## 2020-01-04 DIAGNOSIS — K219 Gastro-esophageal reflux disease without esophagitis: Secondary | ICD-10-CM | POA: Diagnosis not present

## 2020-01-04 DIAGNOSIS — G894 Chronic pain syndrome: Secondary | ICD-10-CM | POA: Diagnosis not present

## 2020-01-04 DIAGNOSIS — M797 Fibromyalgia: Secondary | ICD-10-CM | POA: Diagnosis not present

## 2020-01-04 DIAGNOSIS — Z9181 History of falling: Secondary | ICD-10-CM | POA: Diagnosis not present

## 2020-01-04 DIAGNOSIS — M80022D Age-related osteoporosis with current pathological fracture, left humerus, subsequent encounter for fracture with routine healing: Secondary | ICD-10-CM | POA: Diagnosis not present

## 2020-01-04 DIAGNOSIS — Z8673 Personal history of transient ischemic attack (TIA), and cerebral infarction without residual deficits: Secondary | ICD-10-CM | POA: Diagnosis not present

## 2020-01-14 DIAGNOSIS — S42292D Other displaced fracture of upper end of left humerus, subsequent encounter for fracture with routine healing: Secondary | ICD-10-CM | POA: Diagnosis not present

## 2020-01-16 DIAGNOSIS — Z9010 Acquired absence of unspecified breast and nipple: Secondary | ICD-10-CM | POA: Diagnosis not present

## 2020-01-16 DIAGNOSIS — M1710 Unilateral primary osteoarthritis, unspecified knee: Secondary | ICD-10-CM | POA: Diagnosis not present

## 2020-01-16 DIAGNOSIS — M797 Fibromyalgia: Secondary | ICD-10-CM | POA: Diagnosis not present

## 2020-01-16 DIAGNOSIS — K219 Gastro-esophageal reflux disease without esophagitis: Secondary | ICD-10-CM | POA: Diagnosis not present

## 2020-01-16 DIAGNOSIS — I10 Essential (primary) hypertension: Secondary | ICD-10-CM | POA: Diagnosis not present

## 2020-01-16 DIAGNOSIS — W19XXXD Unspecified fall, subsequent encounter: Secondary | ICD-10-CM | POA: Diagnosis not present

## 2020-01-16 DIAGNOSIS — D649 Anemia, unspecified: Secondary | ICD-10-CM | POA: Diagnosis not present

## 2020-01-16 DIAGNOSIS — Z853 Personal history of malignant neoplasm of breast: Secondary | ICD-10-CM | POA: Diagnosis not present

## 2020-01-16 DIAGNOSIS — M7540 Impingement syndrome of unspecified shoulder: Secondary | ICD-10-CM | POA: Diagnosis not present

## 2020-01-16 DIAGNOSIS — Z79891 Long term (current) use of opiate analgesic: Secondary | ICD-10-CM | POA: Diagnosis not present

## 2020-01-16 DIAGNOSIS — R519 Headache, unspecified: Secondary | ICD-10-CM | POA: Diagnosis not present

## 2020-01-16 DIAGNOSIS — Z9181 History of falling: Secondary | ICD-10-CM | POA: Diagnosis not present

## 2020-01-16 DIAGNOSIS — M80022D Age-related osteoporosis with current pathological fracture, left humerus, subsequent encounter for fracture with routine healing: Secondary | ICD-10-CM | POA: Diagnosis not present

## 2020-02-11 DIAGNOSIS — Z9181 History of falling: Secondary | ICD-10-CM | POA: Diagnosis not present

## 2020-02-11 DIAGNOSIS — W19XXXD Unspecified fall, subsequent encounter: Secondary | ICD-10-CM | POA: Diagnosis not present

## 2020-02-11 DIAGNOSIS — I10 Essential (primary) hypertension: Secondary | ICD-10-CM | POA: Diagnosis not present

## 2020-02-11 DIAGNOSIS — R519 Headache, unspecified: Secondary | ICD-10-CM | POA: Diagnosis not present

## 2020-02-11 DIAGNOSIS — Z853 Personal history of malignant neoplasm of breast: Secondary | ICD-10-CM | POA: Diagnosis not present

## 2020-02-11 DIAGNOSIS — K219 Gastro-esophageal reflux disease without esophagitis: Secondary | ICD-10-CM | POA: Diagnosis not present

## 2020-02-11 DIAGNOSIS — M80022D Age-related osteoporosis with current pathological fracture, left humerus, subsequent encounter for fracture with routine healing: Secondary | ICD-10-CM | POA: Diagnosis not present

## 2020-02-11 DIAGNOSIS — M7540 Impingement syndrome of unspecified shoulder: Secondary | ICD-10-CM | POA: Diagnosis not present

## 2020-02-11 DIAGNOSIS — M1710 Unilateral primary osteoarthritis, unspecified knee: Secondary | ICD-10-CM | POA: Diagnosis not present

## 2020-02-11 DIAGNOSIS — S42292D Other displaced fracture of upper end of left humerus, subsequent encounter for fracture with routine healing: Secondary | ICD-10-CM | POA: Diagnosis not present

## 2020-02-11 DIAGNOSIS — Z9010 Acquired absence of unspecified breast and nipple: Secondary | ICD-10-CM | POA: Diagnosis not present

## 2020-02-11 DIAGNOSIS — D649 Anemia, unspecified: Secondary | ICD-10-CM | POA: Diagnosis not present

## 2020-02-11 DIAGNOSIS — M797 Fibromyalgia: Secondary | ICD-10-CM | POA: Diagnosis not present

## 2020-02-11 DIAGNOSIS — Z79891 Long term (current) use of opiate analgesic: Secondary | ICD-10-CM | POA: Diagnosis not present

## 2020-02-19 DIAGNOSIS — M25512 Pain in left shoulder: Secondary | ICD-10-CM | POA: Diagnosis not present

## 2020-02-19 DIAGNOSIS — M25612 Stiffness of left shoulder, not elsewhere classified: Secondary | ICD-10-CM | POA: Diagnosis not present

## 2020-02-20 DIAGNOSIS — H6692 Otitis media, unspecified, left ear: Secondary | ICD-10-CM | POA: Diagnosis not present

## 2020-02-20 DIAGNOSIS — F411 Generalized anxiety disorder: Secondary | ICD-10-CM | POA: Diagnosis not present

## 2020-02-20 DIAGNOSIS — K219 Gastro-esophageal reflux disease without esophagitis: Secondary | ICD-10-CM | POA: Diagnosis not present

## 2020-02-20 DIAGNOSIS — M810 Age-related osteoporosis without current pathological fracture: Secondary | ICD-10-CM | POA: Diagnosis not present

## 2020-02-20 DIAGNOSIS — M797 Fibromyalgia: Secondary | ICD-10-CM | POA: Diagnosis not present

## 2020-02-20 DIAGNOSIS — M7061 Trochanteric bursitis, right hip: Secondary | ICD-10-CM | POA: Diagnosis not present

## 2020-02-20 DIAGNOSIS — G4709 Other insomnia: Secondary | ICD-10-CM | POA: Diagnosis not present

## 2020-02-20 DIAGNOSIS — I10 Essential (primary) hypertension: Secondary | ICD-10-CM | POA: Diagnosis not present

## 2020-02-20 DIAGNOSIS — G8929 Other chronic pain: Secondary | ICD-10-CM | POA: Diagnosis not present

## 2020-02-20 DIAGNOSIS — N189 Chronic kidney disease, unspecified: Secondary | ICD-10-CM | POA: Diagnosis not present

## 2020-02-20 DIAGNOSIS — R32 Unspecified urinary incontinence: Secondary | ICD-10-CM | POA: Diagnosis not present

## 2020-02-29 DIAGNOSIS — M25612 Stiffness of left shoulder, not elsewhere classified: Secondary | ICD-10-CM | POA: Diagnosis not present

## 2020-02-29 DIAGNOSIS — M25512 Pain in left shoulder: Secondary | ICD-10-CM | POA: Diagnosis not present

## 2020-03-14 DIAGNOSIS — M25512 Pain in left shoulder: Secondary | ICD-10-CM | POA: Diagnosis not present

## 2020-03-14 DIAGNOSIS — M25612 Stiffness of left shoulder, not elsewhere classified: Secondary | ICD-10-CM | POA: Diagnosis not present

## 2020-03-21 DIAGNOSIS — M25512 Pain in left shoulder: Secondary | ICD-10-CM | POA: Diagnosis not present

## 2020-03-21 DIAGNOSIS — M25612 Stiffness of left shoulder, not elsewhere classified: Secondary | ICD-10-CM | POA: Diagnosis not present

## 2020-03-24 DIAGNOSIS — S42292D Other displaced fracture of upper end of left humerus, subsequent encounter for fracture with routine healing: Secondary | ICD-10-CM | POA: Diagnosis not present

## 2020-03-25 DIAGNOSIS — M25512 Pain in left shoulder: Secondary | ICD-10-CM | POA: Diagnosis not present

## 2020-03-25 DIAGNOSIS — M25612 Stiffness of left shoulder, not elsewhere classified: Secondary | ICD-10-CM | POA: Diagnosis not present

## 2020-04-08 DIAGNOSIS — M25612 Stiffness of left shoulder, not elsewhere classified: Secondary | ICD-10-CM | POA: Diagnosis not present

## 2020-04-08 DIAGNOSIS — M25512 Pain in left shoulder: Secondary | ICD-10-CM | POA: Diagnosis not present

## 2020-04-11 DIAGNOSIS — M25512 Pain in left shoulder: Secondary | ICD-10-CM | POA: Diagnosis not present

## 2020-04-11 DIAGNOSIS — M25612 Stiffness of left shoulder, not elsewhere classified: Secondary | ICD-10-CM | POA: Diagnosis not present

## 2020-04-18 DIAGNOSIS — M25612 Stiffness of left shoulder, not elsewhere classified: Secondary | ICD-10-CM | POA: Diagnosis not present

## 2020-04-18 DIAGNOSIS — M25512 Pain in left shoulder: Secondary | ICD-10-CM | POA: Diagnosis not present

## 2020-04-22 DIAGNOSIS — M25512 Pain in left shoulder: Secondary | ICD-10-CM | POA: Diagnosis not present

## 2020-04-22 DIAGNOSIS — M25612 Stiffness of left shoulder, not elsewhere classified: Secondary | ICD-10-CM | POA: Diagnosis not present

## 2020-04-25 DIAGNOSIS — M25612 Stiffness of left shoulder, not elsewhere classified: Secondary | ICD-10-CM | POA: Diagnosis not present

## 2020-04-25 DIAGNOSIS — M25512 Pain in left shoulder: Secondary | ICD-10-CM | POA: Diagnosis not present

## 2020-04-29 DIAGNOSIS — S42292D Other displaced fracture of upper end of left humerus, subsequent encounter for fracture with routine healing: Secondary | ICD-10-CM | POA: Diagnosis not present

## 2020-05-07 DIAGNOSIS — G8929 Other chronic pain: Secondary | ICD-10-CM | POA: Diagnosis not present

## 2020-05-07 DIAGNOSIS — H6692 Otitis media, unspecified, left ear: Secondary | ICD-10-CM | POA: Diagnosis not present

## 2020-05-07 DIAGNOSIS — M797 Fibromyalgia: Secondary | ICD-10-CM | POA: Diagnosis not present

## 2020-05-07 DIAGNOSIS — I10 Essential (primary) hypertension: Secondary | ICD-10-CM | POA: Diagnosis not present

## 2020-05-07 DIAGNOSIS — N189 Chronic kidney disease, unspecified: Secondary | ICD-10-CM | POA: Diagnosis not present

## 2020-05-07 DIAGNOSIS — R32 Unspecified urinary incontinence: Secondary | ICD-10-CM | POA: Diagnosis not present

## 2020-05-07 DIAGNOSIS — G4709 Other insomnia: Secondary | ICD-10-CM | POA: Diagnosis not present

## 2020-05-07 DIAGNOSIS — K219 Gastro-esophageal reflux disease without esophagitis: Secondary | ICD-10-CM | POA: Diagnosis not present

## 2020-05-07 DIAGNOSIS — F411 Generalized anxiety disorder: Secondary | ICD-10-CM | POA: Diagnosis not present

## 2020-05-07 DIAGNOSIS — M7061 Trochanteric bursitis, right hip: Secondary | ICD-10-CM | POA: Diagnosis not present

## 2020-05-07 DIAGNOSIS — M810 Age-related osteoporosis without current pathological fracture: Secondary | ICD-10-CM | POA: Diagnosis not present

## 2020-06-03 DIAGNOSIS — S42292D Other displaced fracture of upper end of left humerus, subsequent encounter for fracture with routine healing: Secondary | ICD-10-CM | POA: Diagnosis not present

## 2020-06-09 ENCOUNTER — Other Ambulatory Visit: Payer: Self-pay | Admitting: Orthopedic Surgery

## 2020-06-10 ENCOUNTER — Encounter
Admission: RE | Admit: 2020-06-10 | Discharge: 2020-06-10 | Disposition: A | Discharge status: Home / Self Care | Payer: PPO | Source: Ambulatory Visit | Attending: Orthopedic Surgery | Admitting: Orthopedic Surgery

## 2020-06-10 ENCOUNTER — Other Ambulatory Visit: Payer: Self-pay

## 2020-06-10 DIAGNOSIS — Z01818 Encounter for other preprocedural examination: Secondary | ICD-10-CM | POA: Insufficient documentation

## 2020-06-10 NOTE — Patient Instructions (Signed)
Your procedure is scheduled on: 06-18-20 [facility] Report to Same Day Surgery 2nd floor medical mall [facility] Entrance-take elevator on left to 2nd floor.  Check in with surgery information desk.) To find out your arrival time please call [phone] between 1PM - 3PM on 06-17-20 TUESDAY  Remember: Instructions that are not followed completely may result in serious medical risk, up to and including death, or upon the discretion of your surgeon and anesthesiologist your surgery may need to be rescheduled.    _x___ 1. Do not eat food after midnight the night before your procedure. NO GUM OR CANDY AFTER MIDNIGHT. You may drink clear liquids up to 2 hours before you are scheduled to arrive at the hospital for your procedure.  Do not drink clear liquids within 2 hours of your scheduled arrival to the hospital.  Clear liquids include  --Water or Apple juice without pulp  --Gatorade  --Black Coffee or Clear Tea (No milk, no creamers, do not add anything to the coffee or Tea-ok to add sugar)     __x__ 2. No Alcohol for 24 hours before or after surgery.   __x__3. No Smoking or e-cigarettes for 24 prior to surgery.  Do not use any chewable tobacco products for at least 6 hour prior to surgery   ____  4. Bring all medications with you on the day of surgery if instructed.    __x__ 5. Notify your doctor if there is any change in your medical condition     (cold, fever, infections).    x___6. On the morning of surgery brush your teeth with toothpaste and water.  You may rinse your mouth with mouth wash if you wish.  Do not swallow any toothpaste or mouthwash.   Do not wear jewelry, make-up, hairpins, clips or nail polish.  Do not wear lotions, powders, or perfumes.   Do not shave 48 hours prior to surgery. Men may shave face and neck.  Do not bring valuables to the hospital.    [facility] is not responsible for any belongings or valuables.               Contacts, dentures or bridgework may  not be worn into surgery.  Leave your suitcase in the car. After surgery it may be brought to your room.  For patients admitted to the hospital, discharge time is determined by your treatment team.  _  Patients discharged the day of surgery will not be allowed to drive home.  You will need someone to drive you home and stay with you the night of your procedure.    Please read over the following fact sheets that you were given:   [facility] Preparing for Surgery  _x___ TAKE THE FOLLOWING MEDICATION THE MORNING OF SURGERY WITH A SMALL SIP OF WATER. These include:  1. XANAX (ALPRAZOLAM)  2. YOU MAY TAKE YOUR NORCO (HYDROCODONE) DAY OF SURGERY IF NEEDED  3.  4.  5.  6.  ____Fleets enema or Magnesium Citrate as directed.   _x___ Use CHG Soap or sage wipes as directed on instruction sheet   ____ Use inhalers on the day of surgery and bring to hospital day of surgery  ____ Stop Metformin and Janumet 2 days prior to surgery.    ____ Take 1/2 of usual insulin dose the night before surgery and none on the morning surgery.   ____ Follow recommendations from Cardiologist, Pulmonologist or PCP regarding stopping Aspirin, Coumadin, Plavix ,Eliquis, Effient, or Pradaxa, and Pletal.  X____Stop Anti-inflammatories such as Advil, Aleve, Ibuprofen, Motrin, Naproxen, Naprosyn, Goodies powders or aspirin products NOW- OK to take Tylenol OR NORCO IF NEEDED   ____ Stop supplements until after surgery.     ____ Bring C-Pap to the hospital.

## 2020-06-17 ENCOUNTER — Other Ambulatory Visit: Payer: Self-pay

## 2020-06-17 ENCOUNTER — Other Ambulatory Visit
Admission: RE | Admit: 2020-06-17 | Discharge: 2020-06-17 | Disposition: A | Discharge status: Home / Self Care | Payer: PPO | Source: Ambulatory Visit | Attending: Orthopedic Surgery | Admitting: Orthopedic Surgery

## 2020-06-17 DIAGNOSIS — Z01812 Encounter for preprocedural laboratory examination: Secondary | ICD-10-CM | POA: Insufficient documentation

## 2020-06-17 DIAGNOSIS — Z20822 Contact with and (suspected) exposure to covid-19: Secondary | ICD-10-CM | POA: Insufficient documentation

## 2020-06-17 LAB — CBC
HCT: 42.3 % (ref 36.0–46.0)
Hemoglobin: 14 g/dL (ref 12.0–15.0)
MCH: 29.8 pg (ref 26.0–34.0)
MCHC: 33.1 g/dL (ref 30.0–36.0)
MCV: 90 fL (ref 80.0–100.0)
Platelets: 218 10*3/uL (ref 150–400)
RBC: 4.7 MIL/uL (ref 3.87–5.11)
RDW: 14.8 % (ref 11.5–15.5)
WBC: 7.6 10*3/uL (ref 4.0–10.5)
nRBC: 0 % (ref 0.0–0.2)

## 2020-06-17 LAB — BASIC METABOLIC PANEL
Anion gap: 9 (ref 5–15)
BUN: 29 mg/dL — ABNORMAL HIGH (ref 8–23)
CO2: 27 mmol/L (ref 22–32)
Calcium: 9 mg/dL (ref 8.9–10.3)
Chloride: 107 mmol/L (ref 98–111)
Creatinine, Ser: 1.22 mg/dL — ABNORMAL HIGH (ref 0.44–1.00)
GFR calc Af Amer: 51 mL/min — ABNORMAL LOW (ref >60)
GFR calc non Af Amer: 44 mL/min — ABNORMAL LOW (ref >60)
Glucose, Bld: 87 mg/dL (ref 70–99)
Potassium: 4.1 mmol/L (ref 3.5–5.1)
Sodium: 143 mmol/L (ref 135–145)

## 2020-06-17 LAB — SARS CORONAVIRUS 2 (TAT 6-24 HRS): SARS Coronavirus 2: NEGATIVE

## 2020-06-17 MED ORDER — CLINDAMYCIN PHOSPHATE 900 MG/50ML IV SOLN
900.0000 mg | INTRAVENOUS | Status: AC
  Administered 2020-06-18: 900 mg via INTRAVENOUS

## 2020-06-18 ENCOUNTER — Encounter
Admission: RE | Disposition: A | Discharge status: Home / Self Care | Payer: Self-pay | Source: Home / Self Care | Attending: Orthopedic Surgery

## 2020-06-18 ENCOUNTER — Other Ambulatory Visit: Payer: Self-pay

## 2020-06-18 ENCOUNTER — Ambulatory Visit: Payer: PPO

## 2020-06-18 ENCOUNTER — Ambulatory Visit: Payer: PPO | Admitting: Anesthesiology

## 2020-06-18 ENCOUNTER — Ambulatory Visit
Admission: RE | Admit: 2020-06-18 | Discharge: 2020-06-18 | Disposition: A | Discharge status: Home / Self Care | Payer: PPO | Attending: Orthopedic Surgery | Admitting: Orthopedic Surgery

## 2020-06-18 ENCOUNTER — Encounter: Payer: Self-pay | Admitting: Orthopedic Surgery

## 2020-06-18 DIAGNOSIS — Z419 Encounter for procedure for purposes other than remedying health state, unspecified: Secondary | ICD-10-CM

## 2020-06-18 DIAGNOSIS — Z472 Encounter for removal of internal fixation device: Secondary | ICD-10-CM | POA: Diagnosis not present

## 2020-06-18 DIAGNOSIS — I10 Essential (primary) hypertension: Secondary | ICD-10-CM | POA: Diagnosis not present

## 2020-06-18 DIAGNOSIS — G8918 Other acute postprocedural pain: Secondary | ICD-10-CM | POA: Diagnosis not present

## 2020-06-18 DIAGNOSIS — J449 Chronic obstructive pulmonary disease, unspecified: Secondary | ICD-10-CM | POA: Insufficient documentation

## 2020-06-18 DIAGNOSIS — M25512 Pain in left shoulder: Secondary | ICD-10-CM | POA: Diagnosis not present

## 2020-06-18 DIAGNOSIS — S42292D Other displaced fracture of upper end of left humerus, subsequent encounter for fracture with routine healing: Secondary | ICD-10-CM | POA: Diagnosis not present

## 2020-06-18 DIAGNOSIS — F418 Other specified anxiety disorders: Secondary | ICD-10-CM | POA: Diagnosis not present

## 2020-06-18 DIAGNOSIS — T8484XA Pain due to internal orthopedic prosthetic devices, implants and grafts, initial encounter: Secondary | ICD-10-CM | POA: Diagnosis not present

## 2020-06-18 DIAGNOSIS — K219 Gastro-esophageal reflux disease without esophagitis: Secondary | ICD-10-CM | POA: Diagnosis not present

## 2020-06-18 HISTORY — PX: HARDWARE REMOVAL: SHX979

## 2020-06-18 SURGERY — REMOVAL, HARDWARE
Anesthesia: General | Site: Shoulder | Laterality: Left

## 2020-06-18 MED ORDER — BUPIVACAINE-EPINEPHRINE (PF) 0.25% -1:200000 IJ SOLN
INTRAMUSCULAR | Status: DC | PRN
  Administered 2020-06-18: 6 mL

## 2020-06-18 MED ORDER — ONDANSETRON HCL 4 MG/2ML IJ SOLN
INTRAMUSCULAR | Status: DC | PRN
  Administered 2020-06-18: 4 mg via INTRAVENOUS

## 2020-06-18 MED ORDER — ACETAMINOPHEN 10 MG/ML IV SOLN
INTRAVENOUS | Status: AC
  Filled 2020-06-18: qty 100

## 2020-06-18 MED ORDER — ROCURONIUM BROMIDE 10 MG/ML (PF) SYRINGE
PREFILLED_SYRINGE | INTRAVENOUS | Status: AC
  Filled 2020-06-18: qty 10

## 2020-06-18 MED ORDER — FENTANYL CITRATE (PF) 100 MCG/2ML IJ SOLN
INTRAMUSCULAR | Status: AC
  Administered 2020-06-18: 50 ug via INTRAVENOUS
  Filled 2020-06-18: qty 2

## 2020-06-18 MED ORDER — BUPIVACAINE LIPOSOME 1.3 % IJ SUSP
INTRAMUSCULAR | Status: AC
  Filled 2020-06-18: qty 20

## 2020-06-18 MED ORDER — FENTANYL CITRATE (PF) 100 MCG/2ML IJ SOLN: 50.0000 ug | Freq: Once | INTRAMUSCULAR | Status: AC

## 2020-06-18 MED ORDER — LIDOCAINE HCL (PF) 1 % IJ SOLN
INTRAMUSCULAR | Status: AC
  Filled 2020-06-18: qty 5

## 2020-06-18 MED ORDER — BUPIVACAINE LIPOSOME 1.3 % IJ SUSP
INTRAMUSCULAR | Status: DC | PRN
  Administered 2020-06-18: 7 mL via PERINEURAL
  Administered 2020-06-18: 13 mL via PERINEURAL

## 2020-06-18 MED ORDER — ORAL CARE MOUTH RINSE: 15.0000 mL | Freq: Once | OROMUCOSAL | Status: DC

## 2020-06-18 MED ORDER — PROPOFOL 10 MG/ML IV BOLUS
INTRAVENOUS | Status: DC | PRN
  Administered 2020-06-18: 100 mg via INTRAVENOUS

## 2020-06-18 MED ORDER — DEXMEDETOMIDINE HCL IN NACL 200 MCG/50ML IV SOLN
INTRAVENOUS | Status: DC | PRN
Start: 2020-06-18 — End: 2020-06-18
  Administered 2020-06-18: 8 ug via INTRAVENOUS

## 2020-06-18 MED ORDER — PROPOFOL 10 MG/ML IV BOLUS
INTRAVENOUS | Status: AC
  Filled 2020-06-18: qty 20

## 2020-06-18 MED ORDER — ONDANSETRON HCL 4 MG/2ML IJ SOLN
INTRAMUSCULAR | Status: AC
  Filled 2020-06-18: qty 4

## 2020-06-18 MED ORDER — MIDAZOLAM HCL 2 MG/2ML IJ SOLN: 1.0000 mg | Freq: Once | INTRAMUSCULAR | Status: AC

## 2020-06-18 MED ORDER — ROCURONIUM BROMIDE 100 MG/10ML IV SOLN
INTRAVENOUS | Status: DC | PRN
  Administered 2020-06-18: 30 mg via INTRAVENOUS
  Administered 2020-06-18 (×2): 10 mg via INTRAVENOUS

## 2020-06-18 MED ORDER — FAMOTIDINE 20 MG PO TABS: 20.0000 mg | ORAL_TABLET | Freq: Once | ORAL | Status: DC

## 2020-06-18 MED ORDER — BACITRACIN 50000 UNITS IM SOLR
INTRAMUSCULAR | Status: AC
  Filled 2020-06-18: qty 1

## 2020-06-18 MED ORDER — LIDOCAINE HCL (PF) 2 % IJ SOLN
INTRAMUSCULAR | Status: AC
  Filled 2020-06-18: qty 5

## 2020-06-18 MED ORDER — BUPIVACAINE-EPINEPHRINE (PF) 0.25% -1:200000 IJ SOLN
INTRAMUSCULAR | Status: AC
  Filled 2020-06-18: qty 30

## 2020-06-18 MED ORDER — SODIUM CHLORIDE 0.9 % IR SOLN
Status: DC | PRN
  Administered 2020-06-18: 1000 mL

## 2020-06-18 MED ORDER — ACETAMINOPHEN 10 MG/ML IV SOLN
INTRAVENOUS | Status: DC | PRN
  Administered 2020-06-18: 1000 mg via INTRAVENOUS

## 2020-06-18 MED ORDER — FENTANYL CITRATE (PF) 100 MCG/2ML IJ SOLN
INTRAMUSCULAR | Status: AC
  Filled 2020-06-18: qty 2

## 2020-06-18 MED ORDER — GLYCOPYRROLATE 0.2 MG/ML IJ SOLN
INTRAMUSCULAR | Status: AC
  Filled 2020-06-18: qty 1

## 2020-06-18 MED ORDER — CLINDAMYCIN PHOSPHATE 900 MG/50ML IV SOLN
INTRAVENOUS | Status: AC
  Filled 2020-06-18: qty 50

## 2020-06-18 MED ORDER — LACTATED RINGERS IV SOLN: INTRAVENOUS | Status: DC

## 2020-06-18 MED ORDER — GLYCOPYRROLATE 0.2 MG/ML IJ SOLN
INTRAMUSCULAR | Status: DC | PRN
  Administered 2020-06-18: .2 mg via INTRAVENOUS

## 2020-06-18 MED ORDER — FENTANYL CITRATE (PF) 100 MCG/2ML IJ SOLN: 25.0000 ug | INTRAMUSCULAR | Status: DC | PRN

## 2020-06-18 MED ORDER — BUPIVACAINE HCL (PF) 0.5 % IJ SOLN
INTRAMUSCULAR | Status: DC | PRN
  Administered 2020-06-18: 7 mL via PERINEURAL
  Administered 2020-06-18: 3 mL via PERINEURAL

## 2020-06-18 MED ORDER — DEXAMETHASONE SODIUM PHOSPHATE 10 MG/ML IJ SOLN
INTRAMUSCULAR | Status: AC
  Filled 2020-06-18: qty 1

## 2020-06-18 MED ORDER — SUCCINYLCHOLINE CHLORIDE 200 MG/10ML IV SOSY
PREFILLED_SYRINGE | INTRAVENOUS | Status: AC
  Filled 2020-06-18: qty 10

## 2020-06-18 MED ORDER — PHENYLEPHRINE HCL (PRESSORS) 10 MG/ML IV SOLN
INTRAVENOUS | Status: DC | PRN
  Administered 2020-06-18 (×3): 100 ug via INTRAVENOUS

## 2020-06-18 MED ORDER — FENTANYL CITRATE (PF) 100 MCG/2ML IJ SOLN
INTRAMUSCULAR | Status: DC | PRN
  Administered 2020-06-18: 50 ug via INTRAVENOUS

## 2020-06-18 MED ORDER — BUPIVACAINE HCL (PF) 0.5 % IJ SOLN
INTRAMUSCULAR | Status: AC
  Filled 2020-06-18: qty 20

## 2020-06-18 MED ORDER — HYDROCODONE-ACETAMINOPHEN 10-325 MG PO TABS: 1.0000 | ORAL_TABLET | Freq: Four times a day (QID) | ORAL | 0 refills | Status: DC | PRN

## 2020-06-18 MED ORDER — CHLORHEXIDINE GLUCONATE 0.12 % MT SOLN: 15.0000 mL | Freq: Once | OROMUCOSAL | Status: DC

## 2020-06-18 MED ORDER — DEXMEDETOMIDINE HCL IN NACL 80 MCG/20ML IV SOLN
INTRAVENOUS | Status: AC
  Filled 2020-06-18: qty 20

## 2020-06-18 MED ORDER — EPHEDRINE SULFATE 50 MG/ML IJ SOLN
INTRAMUSCULAR | Status: DC | PRN
  Administered 2020-06-18: 10 mg via INTRAVENOUS
  Administered 2020-06-18: 5 mg via INTRAVENOUS
  Administered 2020-06-18: 10 mg via INTRAVENOUS

## 2020-06-18 MED ORDER — DEXAMETHASONE SODIUM PHOSPHATE 10 MG/ML IJ SOLN
INTRAMUSCULAR | Status: DC | PRN
  Administered 2020-06-18: 10 mg via INTRAVENOUS

## 2020-06-18 MED ORDER — SUGAMMADEX SODIUM 200 MG/2ML IV SOLN
INTRAVENOUS | Status: DC | PRN
  Administered 2020-06-18: 200 mg via INTRAVENOUS

## 2020-06-18 MED ORDER — SODIUM CHLORIDE 0.9 % IV SOLN
INTRAVENOUS | Status: DC | PRN
  Administered 2020-06-18: 10 ug/min via INTRAVENOUS

## 2020-06-18 MED ORDER — MIDAZOLAM HCL 2 MG/2ML IJ SOLN
INTRAMUSCULAR | Status: AC
  Administered 2020-06-18: 1 mg via INTRAVENOUS
  Filled 2020-06-18: qty 2

## 2020-06-18 SURGICAL SUPPLY — 42 items
BIT DRILL 10 HOLLOW (BIT) ×3 IMPLANT
BNDG COHESIVE 4X5 TAN STRL (GAUZE/BANDAGES/DRESSINGS) ×3 IMPLANT
BNDG ELASTIC 4X5.8 VLCR STR LF (GAUZE/BANDAGES/DRESSINGS) ×3 IMPLANT
BRUSH SCRUB EZ  4% CHG (MISCELLANEOUS) ×2
BRUSH SCRUB EZ 4% CHG (MISCELLANEOUS) ×1 IMPLANT
CANISTER SUCT 1200ML W/VALVE (MISCELLANEOUS) ×3 IMPLANT
CHLORAPREP W/TINT 26 (MISCELLANEOUS) ×3 IMPLANT
COVER WAND RF STERILE (DRAPES) ×3 IMPLANT
CUFF TOURN SGL QUICK 18X4 (TOURNIQUET CUFF) IMPLANT
CUFF TOURN SGL QUICK 24 (TOURNIQUET CUFF)
CUFF TRNQT CYL 24X4X16.5-23 (TOURNIQUET CUFF) IMPLANT
DRAPE C-ARM XRAY 36X54 (DRAPES) ×3 IMPLANT
DRAPE FLUOR MINI C-ARM 54X84 (DRAPES) IMPLANT
DRAPE IMP U-DRAPE 54X76 (DRAPES) ×6 IMPLANT
DRAPE INCISE IOBAN 66X45 STRL (DRAPES) ×3 IMPLANT
ELECT CAUTERY BLADE 6.4 (BLADE) ×3 IMPLANT
ELECT REM PT RETURN 9FT ADLT (ELECTROSURGICAL) ×3
ELECTRODE REM PT RTRN 9FT ADLT (ELECTROSURGICAL) ×1 IMPLANT
GAUZE SPONGE 4X4 12PLY STRL (GAUZE/BANDAGES/DRESSINGS) ×3 IMPLANT
GAUZE XEROFORM 1X8 LF (GAUZE/BANDAGES/DRESSINGS) ×3 IMPLANT
GLOVE INDICATOR 8.0 STRL GRN (GLOVE) ×3 IMPLANT
GLOVE SURG ORTHO 8.0 STRL STRW (GLOVE) ×6 IMPLANT
GOWN STRL REUS W/ TWL LRG LVL3 (GOWN DISPOSABLE) ×1 IMPLANT
GOWN STRL REUS W/ TWL XL LVL3 (GOWN DISPOSABLE) ×1 IMPLANT
GOWN STRL REUS W/TWL LRG LVL3 (GOWN DISPOSABLE) ×2
GOWN STRL REUS W/TWL XL LVL3 (GOWN DISPOSABLE) ×2
GUIDEROD SS 2.5MMX280MM (ROD) ×3 IMPLANT
K-WIRE TROCAR POINT 2.5X280 (WIRE) ×3
KIT TURNOVER KIT A (KITS) ×3 IMPLANT
KWIRE TROCAR POINT 2.5X280 (WIRE) ×1 IMPLANT
NEEDLE FILTER BLUNT 18X 1/2SAF (NEEDLE) ×2
NEEDLE FILTER BLUNT 18X1 1/2 (NEEDLE) ×1 IMPLANT
NS IRRIG 1000ML POUR BTL (IV SOLUTION) ×3 IMPLANT
PACK EXTREMITY (MISCELLANEOUS) ×3 IMPLANT
SLING ARM M TX990204 (SOFTGOODS) ×3 IMPLANT
STAPLER SKIN PROX 35W (STAPLE) ×3 IMPLANT
STOCKINETTE M/LG 89821 (MISCELLANEOUS) ×3 IMPLANT
SUT PROLENE 4 0 PS 2 18 (SUTURE) ×3 IMPLANT
SUT VIC AB 0 CT1 36 (SUTURE) ×3 IMPLANT
SUT VIC AB 2-0 SH 27 (SUTURE) ×4
SUT VIC AB 2-0 SH 27XBRD (SUTURE) ×2 IMPLANT
SYR 10ML LL (SYRINGE) ×3 IMPLANT

## 2020-06-18 NOTE — Discharge Instructions (Signed)
Interscalene Nerve Block with Exparel  1.  For your surgery you have received an Interscalene Nerve Block with Exparel. 2. Nerve Blocks affect many types of nerves, including nerves that control movement, pain and normal sensation.  You may experience feelings such as numbness, tingling, heaviness, weakness or the inability to move your arm or the feeling or sensation that your arm has "fallen asleep". 3. A nerve block with Exparel can last up to 5 days.  Usually the weakness wears off first.  The tingling and heaviness usually wear off next.  Finally you may start to notice pain.  Keep in mind that this may occur in any order.  Once a nerve block starts to wear off it is usually completely gone within 60 minutes. 4. ISNB may cause mild shortness of breath, a hoarse voice, blurry vision, unequal pupils, or drooping of the face on the same side as the nerve block.  These symptoms will usually resolve with the numbness.  Very rarely the procedure itself can cause mild seizures. 5. If needed, your surgeon will give you a prescription for pain medication.  It will take about 60 minutes for the oral pain medication to become fully effective.  So, it is recommended that you start taking this medication before the nerve block first begins to wear off, or when you first begin to feel discomfort. 6. Take your pain medication only as prescribed.  Pain medication can cause sedation and decrease your breathing if you take more than you need for the level of pain that you have. 7. Nausea is a common side effect of many pain medications.  You may want to eat something before taking your pain medicine to prevent nausea. 8. After an Interscalene nerve block, you cannot feel pain, pressure or extremes in temperature in the effected arm.  Because your arm is numb it is at an increased risk for injury.  To decrease the possibility of injury, please practice the following:  a. While you are awake change the position of  your arm frequently to prevent too much pressure on any one area for prolonged periods of time. b.  If you have a cast or tight dressing, check the color or your fingers every couple of hours.  Call your surgeon with the appearance of any discoloration (white or blue). c. If you are given a sling to wear before you go home, please wear it  at all times until the block has completely worn off.  Do not get up at night without your sling. d. Please contact [facility] or your surgeon if you do not begin to regain sensation after 7 days from the surgery.  Anesthesia may be contacted by calling the Same Day Surgery Department, Mon. through Fri., 6 am to 4 pm at [phone].   e. If you experience any other problems or concerns, please contact your surgeon's office. If you experience severe or prolonged shortness of breath go to the nearest emergency department.    AMBULATORY SURGERY  DISCHARGE INSTRUCTIONS   1) The drugs that you were given will stay in your system until tomorrow so for the next 24 hours you should not:  A) Drive an automobile B) Make any legal decisions C) Drink any alcoholic beverage   2) You may resume regular meals tomorrow.  Today it is better to start with liquids and gradually work up to solid foods.  You may eat anything you prefer, but it is better to start with liquids, then soup  and crackers, and gradually work up to solid foods.   3) Please notify your doctor immediately if you have any unusual bleeding, trouble breathing, redness and pain at the surgery site, drainage, fever, or pain not relieved by medication. 4)   5) Your post-operative visit with Dr.                                     is: Date:                        Time:    Please call to schedule your post-operative visit.  6) Additional Instructions:      Sling all times until nerve block has worn off.  Call with questions or concerns such as fever, shortness of breath or drainage from  wound [phone]  No lifting > 5 pounds with left arm, gentle passive and active range of motion is OK

## 2020-06-18 NOTE — Transfer of Care (Signed)
Immediate Anesthesia Transfer of Care Note  Patient: Donna Evans  Procedure(s) Performed: HARDWARE REMOVAL (Left Shoulder)  Patient Location: PACU  Anesthesia Type:General  Level of Consciousness: awake and drowsy  Airway & Oxygen Therapy: Patient Spontanous Breathing and Patient connected to face mask oxygen  Post-op Assessment: Report given to RN and Post -op Vital signs reviewed and stable  Post vital signs: Reviewed and stable  Last Vitals:  Vitals Value Taken Time  BP 143/72 06/18/20 1730  Temp    Pulse 70 06/18/20 1734  Resp 15 06/18/20 1734  SpO2 100 % 06/18/20 1734    Last Pain:  Vitals:   06/18/20 1307  TempSrc: Tympanic  PainSc: 7          Complications: No complications documented.

## 2020-06-18 NOTE — Anesthesia Procedure Notes (Signed)
Anesthesia Regional Block: Interscalene brachial plexus block   Pre-Anesthetic Checklist: ,, timeout performed, Correct Patient, Correct Site, Correct Laterality, Correct Procedure, Correct Position, site marked, Risks and benefits discussed,  Surgical consent,  Pre-op evaluation,  At surgeon's request and post-op pain management  Laterality: Upper and Left  Prep: chloraprep       Needles:  Injection technique: Single-shot  Needle Type: Stimiplex     Needle Length: 5cm  Needle Gauge: 22     Additional Needles:   Procedures:,,,, ultrasound used (permanent image in chart),,,,  Narrative:  Start time: 06/18/2020 1:46 PM End time: 06/18/2020 1:48 PM Injection made incrementally with aspirations every 5 mL.  Performed by: Personally  Anesthesiologist: Versie Soave, Precious Haws, MD  Additional Notes: Patient consented for risk and benefits of nerve block including but not limited to nerve damage, failed block, bleeding and infection.  Patient voiced understanding.  Patient is having hardware removed from left shoulder because of pain in that shoulder and weakness/numbness in her left hand and fingers.  She was consented that there is a risk that the peripheral nerve block could make these symptoms worse.  She voiced understanding and would still like to proceed with the nerve block for pain control 2/2 her multiple allergies to pain medicines.  Functioning IV was confirmed and monitors were applied.  Timeout done prior to procedure and prior to any sedation being given to the patient.  Patient confirmed procedure site prior to any sedation given to the patient.  A 75mm 22ga Stimuplex needle was used. Sterile prep,hand hygiene and sterile gloves were used.  Minimal sedation used for procedure.  No paresthesia endorsed by patient during the procedure.  Negative aspiration and negative test dose prior to incremental administration of local anesthetic. The patient tolerated the procedure well with  no immediate complications.

## 2020-06-18 NOTE — H&P (Signed)
The patient has been re-examined, and the chart reviewed, and there have been no interval changes to the documented history and physical.  Plan a left shoulder humeral nail removal today.  Anesthesia is consulted regarding a peripheral nerve block for post-operative pain.  The risks, benefits, and alternatives have been discussed at length, and the patient is willing to proceed.

## 2020-06-18 NOTE — Anesthesia Procedure Notes (Signed)
Procedure Name: Intubation Performed by: Kelton Pillar, CRNA Pre-anesthesia Checklist: Patient identified, Emergency Drugs available, Suction available and Patient being monitored Patient Re-evaluated:Patient Re-evaluated prior to induction Oxygen Delivery Method: Circle system utilized Preoxygenation: Pre-oxygenation with 100% oxygen Induction Type: IV induction Ventilation: Mask ventilation without difficulty Laryngoscope Size: McGraph and 3 Grade View: Grade I Tube type: Oral Tube size: 6.5 mm Number of attempts: 1 Airway Equipment and Method: Stylet and Oral airway Placement Confirmation: ETT inserted through vocal cords under direct vision,  positive ETCO2 and breath sounds checked- equal and bilateral Secured at: 21 cm Tube secured with: Tape Dental Injury: Teeth and Oropharynx as per pre-operative assessment

## 2020-06-18 NOTE — Op Note (Signed)
  06/18/2020  5:22 PM  PATIENT:  Donna Evans    PRE-OPERATIVE DIAGNOSIS:  S42.292D Oth disp fx of upper end l humer, subs for fx w routn heal  POST-OPERATIVE DIAGNOSIS:  Same  PROCEDURE:  HARDWARE REMOVAL, LEFT HUMERAL NAIL AND 4 SCREWS  SURGEON:  Lovell Sheehan, MD   ASSIST: Carlynn Spry PA-C  ANESTHESIA:   General  PREOPERATIVE INDICATIONS:  [name] is a  74 y.o. female with a diagnosis of S42.292D Oth disp fx of upper end l humer, subs for fx w routn heal who  elected for surgical management of the fracture  The risks benefits and alternatives were discussed with the patient preoperatively including but not limited to the risks of infection, bleeding, nerve injury, cardiopulmonary complications, the need for revision surgery, among others, and the patient was willing to proceed.  EBL: 10 CC  TOURNIQUET TIME: none used  OPERATIVE IMPLANTS: Synthes humeral nail/screws removed  OPERATIVE FINDINGS: healed proximal humerus fracture  OPERATIVE PROCEDURE:   After informed consent was obtained and the appropriate extremity marked in the pre-operative holding area, the patient was taken to the operating room and placed in the supine position. General anesthesia was induced and the leftextremity was prepped and draped in standard sterile fashion.  The prior incision was utilized. The deltoid was split in-line with its fibers and the rotator cuff exposed. The rotator interval was opened and the proximal portion of the screw identified. The proximal screws were removed and the threaded rod inserted in to the nail. The prior lateral arm incision was utilized and the distal interlock screw removed. The nail was then backslapped out of the humeral canal. The wound was irrigated. Final x-rays showed a stable fracture and removal of all hardware. 0 Vicryl was used for the deltoid, and 2-0 Vicryl for the subcutaneous layer. Staples were used for skin closure. A sterile dressing was applied.  She tolerated the procedure well and was taken to the recovery room in good condition.   Elyn Aquas. Harlow Mares, MD

## 2020-06-18 NOTE — Anesthesia Postprocedure Evaluation (Signed)
Anesthesia Post Note  Patient: Donna Evans  Procedure(s) Performed: HARDWARE REMOVAL (Left Shoulder)  Patient location during evaluation: PACU Anesthesia Type: General Level of consciousness: awake and alert Pain management: pain level controlled Vital Signs Assessment: post-procedure vital signs reviewed and stable Respiratory status: spontaneous breathing, nonlabored ventilation, respiratory function stable and patient connected to nasal cannula oxygen Cardiovascular status: blood pressure returned to baseline and stable Postop Assessment: no apparent nausea or vomiting Anesthetic complications: no   No complications documented.   Last Vitals:  Vitals:   06/18/20 1800 06/18/20 1815  BP: (!) 139/49 (!) 129/59  Pulse: 63 63  Resp: 20 16  Temp:    SpO2: 96% 95%    Last Pain:  Vitals:   06/18/20 1815  TempSrc:   PainSc: 0-No pain                 Martha Clan

## 2020-06-18 NOTE — Anesthesia Preprocedure Evaluation (Addendum)
Anesthesia Evaluation  Patient identified by MRN, date of birth, ID band Patient awake    Reviewed: Allergy & Precautions, H&P , NPO status , Patient's Chart, lab work & pertinent test results  History of Anesthesia Complications Negative for: history of anesthetic complications  Airway Mallampati: III  TM Distance: <3 FB Neck ROM: limited    Dental  (+) Chipped, Poor Dentition   Pulmonary COPD, Current Smoker and Patient abstained from smoking., former smoker,    Pulmonary exam normal        Cardiovascular Exercise Tolerance: Good hypertension, (-) angina(-) Past MI Normal cardiovascular exam     Neuro/Psych  Headaches, PSYCHIATRIC DISORDERS TIA Neuromuscular disease CVA, Residual Symptoms    GI/Hepatic Neg liver ROS, GERD  Medicated and Controlled,  Endo/Other  negative endocrine ROS  Renal/GU Renal disease     Musculoskeletal  (+) Arthritis , Fibromyalgia -  Abdominal   Peds  Hematology negative hematology ROS (+)   Anesthesia Other Findings Past Medical History: No date: Allergic state No date: Anginal pain ([place])     Comment:  Prinzmetal's angina No date: Anxiety No date: Arthritis     Comment:  osteoarthritis 1990: Breast cancer ([place])     Comment:  right breast cancer No date: Chronic pain No date: Chronic pain No date: COPD (chronic obstructive pulmonary disease) (HCC) No date: Depression No date: Edema No date: Fibromyalgia No date: Foot fracture     Comment:  Bilateral No date: GERD (gastroesophageal reflux disease) No date: History of kidney stones No date: Hypertension No date: Leg fracture, right No date: Low back pain No date: Lumbosacral neuritis No date: Medulloadrenal hyperfunction (HCC) No date: Migraine headache No date: Peripheral neuropathy No date: Shoulder fracture, right No date: Stroke [facility])     Comment:  TIA No date: Systemic lupus erythematosus ([place]) No date: Thyroid  disease 11/28/2013: TIA (transient ischemic attack) 2011: Transient global amnesia  Past Surgical History: 1981: ABDOMINAL HYSTERECTOMY 1957: APPENDECTOMY 1990: AUGMENTATION MAMMAPLASTY; Bilateral     Comment:  saline submuscular 1989: BACK SURGERY 1990: BILATERAL TOTAL MASTECTOMY WITH AXILLARY LYMPH NODE DISSECTION 05/11/2016: BREAST IMPLANT EXCHANGE; Bilateral     Comment:  Procedure: REMOVAL AND REPLACEMENT OF BREAST IMPLANTS ;               Surgeon: Irene Limbo, MD;  Location: [facility];  Service: Plastics;  Laterality:               Bilateral; 05/11/2016: CAPSULECTOMY; Bilateral     Comment:  Procedure: CAPSULECTOMY CAPSULORRAPHY ;  Surgeon: Irene Limbo, MD;  Location: [place];                Service: Plastics;  Laterality: Bilateral; 12/26/2018: CATARACT EXTRACTION W/PHACO; Right     Comment:  Procedure: CATARACT EXTRACTION PHACO AND INTRAOCULAR               LENS PLACEMENT ([place]) RIGHT;  Surgeon: Birder Robson,               MD;  Location: [facility];  Service: Ophthalmology;                Laterality: Right;  [place]  00:46 CDE 8.50 Fluid pack lot #              [id number] 01/23/2019: CATARACT EXTRACTION  W/PHACO; Left     Comment:  Procedure: CATARACT EXTRACTION PHACO AND INTRAOCULAR               LENS PLACEMENT ([place]) LEFT;  Surgeon: Birder Robson,               MD;  Location: [facility];  Service: Ophthalmology;                Laterality: Left;  [place]  00:45 CDE  8.41 fluid pack lot #              [id number] 1979: CHOLECYSTECTOMY 01/25/2018: COLONOSCOPY WITH PROPOFOL; N/A     Comment:  Procedure: COLONOSCOPY WITH PROPOFOL;  Surgeon: Manya Silvas, MD;  Location: [facility] END[facility];  Service:               Endoscopy;  Laterality: N/A; No date: COPD 03/07/2018: ESOPHAGOGASTRODUODENOSCOPY (EGD) WITH PROPOFOL; N/A     Comment:  Procedure: ESOPHAGOGASTRODUODENOSCOPY (EGD) WITH               PROPOFOL;   Surgeon: Manya Silvas, MD;  Location:               [facility];  Service: Endoscopy;  Laterality: N/A; No date: FRACTURE SURGERY 12/31/2019: HUMERUS IM NAIL; Left     Comment:  Procedure: INTRAMEDULLARY (IM) NAIL HUMERAL;  Surgeon:               Lovell Sheehan, MD;  Location: [facility];  Service:               Orthopedics;  Laterality: Left; 05/11/2016: LIPOSUCTION; Right     Comment:  Procedure: LIPOSUCTION;  Surgeon: Irene Limbo, MD;               Location: [place];  Service: Plastics;               Laterality: Right; 2011: LIVER BIOPSY 1990: MASTECTOMY SUBCUTANEOUS; Bilateral 05/11/2016: MASTOPEXY; Bilateral     Comment:  Procedure: MASTOPEXY BILATERAL ;  Surgeon: Irene Limbo, MD;  Location: [place];                Service: Plastics;  Laterality: Bilateral;  BMI    Body Mass Index: 21.31 kg/m      Reproductive/Obstetrics negative OB ROS                             Anesthesia Physical Anesthesia Plan  ASA: III  Anesthesia Plan: General ETT   Post-op Pain Management: GA combined w/ Regional for post-op pain   Induction: Intravenous  PONV Risk Score and Plan: Ondansetron, Dexamethasone, Midazolam and Treatment may vary due to age or medical condition  Airway Management Planned: Oral ETT  Additional Equipment:   Intra-op Plan:   Post-operative Plan: Extubation in OR  Informed Consent: I have reviewed the patients History and Physical, chart, labs and discussed the procedure including the risks, benefits and alternatives for the proposed anesthesia with the patient or authorized representative who has indicated his/her understanding and acceptance.     Dental Advisory Given  Plan Discussed with: Anesthesiologist, CRNA and Surgeon  Anesthesia Plan Comments: (Patient is having hardware removed from left shoulder because of pain in that shoulder and weakness/numbness in her left  hand and fingers.  She was consented that there is a risk that the peripheral nerve block could make these symptoms worse.  She voiced understanding and would still like to proceed with a nerve block for pain control 2/2 her multiple allergies to pain medicines.  Patient consented for risks of anesthesia including but not limited to:  - adverse reactions to medications - damage to eyes, teeth, lips or other oral mucosa - nerve damage due to positioning  - sore throat or hoarseness - Damage to heart, brain, nerves, lungs, other parts of body or loss of life  Patient voiced understanding.)     Anesthesia Quick Evaluation

## 2020-06-19 ENCOUNTER — Encounter: Payer: Self-pay | Admitting: Orthopedic Surgery

## 2020-07-01 DIAGNOSIS — S42292D Other displaced fracture of upper end of left humerus, subsequent encounter for fracture with routine healing: Secondary | ICD-10-CM | POA: Diagnosis not present

## 2020-07-03 DIAGNOSIS — Z9013 Acquired absence of bilateral breasts and nipples: Secondary | ICD-10-CM | POA: Diagnosis not present

## 2020-07-03 DIAGNOSIS — Z853 Personal history of malignant neoplasm of breast: Secondary | ICD-10-CM | POA: Diagnosis not present

## 2020-07-03 DIAGNOSIS — M7542 Impingement syndrome of left shoulder: Secondary | ICD-10-CM | POA: Diagnosis not present

## 2020-07-03 DIAGNOSIS — M25559 Pain in unspecified hip: Secondary | ICD-10-CM | POA: Diagnosis not present

## 2020-07-03 DIAGNOSIS — M1710 Unilateral primary osteoarthritis, unspecified knee: Secondary | ICD-10-CM | POA: Diagnosis not present

## 2020-07-03 DIAGNOSIS — M797 Fibromyalgia: Secondary | ICD-10-CM | POA: Diagnosis not present

## 2020-07-03 DIAGNOSIS — G894 Chronic pain syndrome: Secondary | ICD-10-CM | POA: Diagnosis not present

## 2020-07-03 DIAGNOSIS — G43909 Migraine, unspecified, not intractable, without status migrainosus: Secondary | ICD-10-CM | POA: Diagnosis not present

## 2020-07-03 DIAGNOSIS — K219 Gastro-esophageal reflux disease without esophagitis: Secondary | ICD-10-CM | POA: Diagnosis not present

## 2020-07-03 DIAGNOSIS — M7550 Bursitis of unspecified shoulder: Secondary | ICD-10-CM | POA: Diagnosis not present

## 2020-07-03 DIAGNOSIS — M25579 Pain in unspecified ankle and joints of unspecified foot: Secondary | ICD-10-CM | POA: Diagnosis not present

## 2020-07-03 DIAGNOSIS — D649 Anemia, unspecified: Secondary | ICD-10-CM | POA: Diagnosis not present

## 2020-07-03 DIAGNOSIS — M80022D Age-related osteoporosis with current pathological fracture, left humerus, subsequent encounter for fracture with routine healing: Secondary | ICD-10-CM | POA: Diagnosis not present

## 2020-07-07 ENCOUNTER — Encounter: Payer: Self-pay | Admitting: Orthopedic Surgery

## 2020-07-07 DIAGNOSIS — G4709 Other insomnia: Secondary | ICD-10-CM | POA: Diagnosis not present

## 2020-07-07 DIAGNOSIS — I10 Essential (primary) hypertension: Secondary | ICD-10-CM | POA: Diagnosis not present

## 2020-07-07 DIAGNOSIS — M797 Fibromyalgia: Secondary | ICD-10-CM | POA: Diagnosis not present

## 2020-07-07 DIAGNOSIS — H6692 Otitis media, unspecified, left ear: Secondary | ICD-10-CM | POA: Diagnosis not present

## 2020-07-07 DIAGNOSIS — R32 Unspecified urinary incontinence: Secondary | ICD-10-CM | POA: Diagnosis not present

## 2020-07-07 DIAGNOSIS — F411 Generalized anxiety disorder: Secondary | ICD-10-CM | POA: Diagnosis not present

## 2020-07-07 DIAGNOSIS — M7061 Trochanteric bursitis, right hip: Secondary | ICD-10-CM | POA: Diagnosis not present

## 2020-07-07 DIAGNOSIS — K219 Gastro-esophageal reflux disease without esophagitis: Secondary | ICD-10-CM | POA: Diagnosis not present

## 2020-07-07 DIAGNOSIS — G8929 Other chronic pain: Secondary | ICD-10-CM | POA: Diagnosis not present

## 2020-07-07 DIAGNOSIS — M810 Age-related osteoporosis without current pathological fracture: Secondary | ICD-10-CM | POA: Diagnosis not present

## 2020-07-07 DIAGNOSIS — N189 Chronic kidney disease, unspecified: Secondary | ICD-10-CM | POA: Diagnosis not present

## 2020-07-13 DIAGNOSIS — K219 Gastro-esophageal reflux disease without esophagitis: Secondary | ICD-10-CM | POA: Diagnosis not present

## 2020-07-13 DIAGNOSIS — G43909 Migraine, unspecified, not intractable, without status migrainosus: Secondary | ICD-10-CM | POA: Diagnosis not present

## 2020-07-13 DIAGNOSIS — M7550 Bursitis of unspecified shoulder: Secondary | ICD-10-CM | POA: Diagnosis not present

## 2020-07-13 DIAGNOSIS — Z9013 Acquired absence of bilateral breasts and nipples: Secondary | ICD-10-CM | POA: Diagnosis not present

## 2020-07-13 DIAGNOSIS — G894 Chronic pain syndrome: Secondary | ICD-10-CM | POA: Diagnosis not present

## 2020-07-13 DIAGNOSIS — Z853 Personal history of malignant neoplasm of breast: Secondary | ICD-10-CM | POA: Diagnosis not present

## 2020-07-13 DIAGNOSIS — D649 Anemia, unspecified: Secondary | ICD-10-CM | POA: Diagnosis not present

## 2020-07-13 DIAGNOSIS — M25559 Pain in unspecified hip: Secondary | ICD-10-CM | POA: Diagnosis not present

## 2020-07-13 DIAGNOSIS — M80022D Age-related osteoporosis with current pathological fracture, left humerus, subsequent encounter for fracture with routine healing: Secondary | ICD-10-CM | POA: Diagnosis not present

## 2020-07-13 DIAGNOSIS — M797 Fibromyalgia: Secondary | ICD-10-CM | POA: Diagnosis not present

## 2020-07-13 DIAGNOSIS — M25579 Pain in unspecified ankle and joints of unspecified foot: Secondary | ICD-10-CM | POA: Diagnosis not present

## 2020-07-13 DIAGNOSIS — M7542 Impingement syndrome of left shoulder: Secondary | ICD-10-CM | POA: Diagnosis not present

## 2020-07-13 DIAGNOSIS — M1710 Unilateral primary osteoarthritis, unspecified knee: Secondary | ICD-10-CM | POA: Diagnosis not present

## 2020-07-29 DIAGNOSIS — S42292D Other displaced fracture of upper end of left humerus, subsequent encounter for fracture with routine healing: Secondary | ICD-10-CM | POA: Diagnosis not present

## 2020-09-09 DIAGNOSIS — Z20822 Contact with and (suspected) exposure to covid-19: Secondary | ICD-10-CM | POA: Diagnosis not present

## 2020-09-09 DIAGNOSIS — J069 Acute upper respiratory infection, unspecified: Secondary | ICD-10-CM | POA: Diagnosis not present

## 2020-09-10 DIAGNOSIS — K219 Gastro-esophageal reflux disease without esophagitis: Secondary | ICD-10-CM | POA: Diagnosis not present

## 2020-09-10 DIAGNOSIS — I10 Essential (primary) hypertension: Secondary | ICD-10-CM | POA: Diagnosis not present

## 2020-09-10 DIAGNOSIS — M810 Age-related osteoporosis without current pathological fracture: Secondary | ICD-10-CM | POA: Diagnosis not present

## 2020-09-10 DIAGNOSIS — M797 Fibromyalgia: Secondary | ICD-10-CM | POA: Diagnosis not present

## 2020-09-10 DIAGNOSIS — G4709 Other insomnia: Secondary | ICD-10-CM | POA: Diagnosis not present

## 2020-09-10 DIAGNOSIS — R32 Unspecified urinary incontinence: Secondary | ICD-10-CM | POA: Diagnosis not present

## 2020-09-10 DIAGNOSIS — F411 Generalized anxiety disorder: Secondary | ICD-10-CM | POA: Diagnosis not present

## 2020-09-10 DIAGNOSIS — J019 Acute sinusitis, unspecified: Secondary | ICD-10-CM | POA: Diagnosis not present

## 2020-09-10 DIAGNOSIS — N189 Chronic kidney disease, unspecified: Secondary | ICD-10-CM | POA: Diagnosis not present

## 2020-09-10 DIAGNOSIS — M7061 Trochanteric bursitis, right hip: Secondary | ICD-10-CM | POA: Diagnosis not present

## 2020-09-10 DIAGNOSIS — G8929 Other chronic pain: Secondary | ICD-10-CM | POA: Diagnosis not present

## 2020-09-15 DIAGNOSIS — H26491 Other secondary cataract, right eye: Secondary | ICD-10-CM | POA: Diagnosis not present

## 2020-09-30 DIAGNOSIS — S92302A Fracture of unspecified metatarsal bone(s), left foot, initial encounter for closed fracture: Secondary | ICD-10-CM | POA: Diagnosis not present

## 2020-10-08 DIAGNOSIS — S92302A Fracture of unspecified metatarsal bone(s), left foot, initial encounter for closed fracture: Secondary | ICD-10-CM | POA: Diagnosis not present

## 2020-10-10 DIAGNOSIS — G4709 Other insomnia: Secondary | ICD-10-CM | POA: Diagnosis not present

## 2020-10-10 DIAGNOSIS — F411 Generalized anxiety disorder: Secondary | ICD-10-CM | POA: Diagnosis not present

## 2020-10-10 DIAGNOSIS — I10 Essential (primary) hypertension: Secondary | ICD-10-CM | POA: Diagnosis not present

## 2020-10-10 DIAGNOSIS — K219 Gastro-esophageal reflux disease without esophagitis: Secondary | ICD-10-CM | POA: Diagnosis not present

## 2020-10-10 DIAGNOSIS — M797 Fibromyalgia: Secondary | ICD-10-CM | POA: Diagnosis not present

## 2020-10-10 DIAGNOSIS — M7061 Trochanteric bursitis, right hip: Secondary | ICD-10-CM | POA: Diagnosis not present

## 2020-10-10 DIAGNOSIS — J019 Acute sinusitis, unspecified: Secondary | ICD-10-CM | POA: Diagnosis not present

## 2020-10-10 DIAGNOSIS — R32 Unspecified urinary incontinence: Secondary | ICD-10-CM | POA: Diagnosis not present

## 2020-10-10 DIAGNOSIS — N189 Chronic kidney disease, unspecified: Secondary | ICD-10-CM | POA: Diagnosis not present

## 2020-10-10 DIAGNOSIS — J301 Allergic rhinitis due to pollen: Secondary | ICD-10-CM | POA: Diagnosis not present

## 2020-10-10 DIAGNOSIS — G8929 Other chronic pain: Secondary | ICD-10-CM | POA: Diagnosis not present

## 2020-10-10 DIAGNOSIS — M810 Age-related osteoporosis without current pathological fracture: Secondary | ICD-10-CM | POA: Diagnosis not present

## 2020-11-03 DIAGNOSIS — E559 Vitamin D deficiency, unspecified: Secondary | ICD-10-CM | POA: Diagnosis not present

## 2020-11-03 DIAGNOSIS — M810 Age-related osteoporosis without current pathological fracture: Secondary | ICD-10-CM | POA: Diagnosis not present

## 2020-11-05 DIAGNOSIS — S92302A Fracture of unspecified metatarsal bone(s), left foot, initial encounter for closed fracture: Secondary | ICD-10-CM | POA: Diagnosis not present

## 2020-12-03 DIAGNOSIS — S92302D Fracture of unspecified metatarsal bone(s), left foot, subsequent encounter for fracture with routine healing: Secondary | ICD-10-CM | POA: Diagnosis not present

## 2020-12-08 ENCOUNTER — Other Ambulatory Visit: Payer: PPO

## 2020-12-10 DIAGNOSIS — R32 Unspecified urinary incontinence: Secondary | ICD-10-CM | POA: Diagnosis not present

## 2020-12-10 DIAGNOSIS — G4489 Other headache syndrome: Secondary | ICD-10-CM | POA: Diagnosis not present

## 2020-12-10 DIAGNOSIS — I951 Orthostatic hypotension: Secondary | ICD-10-CM | POA: Diagnosis not present

## 2020-12-10 DIAGNOSIS — J301 Allergic rhinitis due to pollen: Secondary | ICD-10-CM | POA: Diagnosis not present

## 2020-12-10 DIAGNOSIS — E785 Hyperlipidemia, unspecified: Secondary | ICD-10-CM | POA: Diagnosis not present

## 2020-12-10 DIAGNOSIS — I10 Essential (primary) hypertension: Secondary | ICD-10-CM | POA: Diagnosis not present

## 2020-12-10 DIAGNOSIS — G4709 Other insomnia: Secondary | ICD-10-CM | POA: Diagnosis not present

## 2020-12-10 DIAGNOSIS — K219 Gastro-esophageal reflux disease without esophagitis: Secondary | ICD-10-CM | POA: Diagnosis not present

## 2020-12-10 DIAGNOSIS — D519 Vitamin B12 deficiency anemia, unspecified: Secondary | ICD-10-CM | POA: Diagnosis not present

## 2020-12-10 DIAGNOSIS — M810 Age-related osteoporosis without current pathological fracture: Secondary | ICD-10-CM | POA: Diagnosis not present

## 2020-12-10 DIAGNOSIS — M7061 Trochanteric bursitis, right hip: Secondary | ICD-10-CM | POA: Diagnosis not present

## 2020-12-10 DIAGNOSIS — F411 Generalized anxiety disorder: Secondary | ICD-10-CM | POA: Diagnosis not present

## 2020-12-11 ENCOUNTER — Ambulatory Visit
Admission: RE | Admit: 2020-12-11 | Discharge: 2020-12-11 | Disposition: A | Discharge status: Home / Self Care | Payer: PPO | Source: Ambulatory Visit | Attending: Internal Medicine | Admitting: Internal Medicine

## 2020-12-11 ENCOUNTER — Other Ambulatory Visit: Payer: Self-pay

## 2020-12-11 ENCOUNTER — Other Ambulatory Visit: Payer: Self-pay | Admitting: Internal Medicine

## 2020-12-11 DIAGNOSIS — R410 Disorientation, unspecified: Secondary | ICD-10-CM | POA: Diagnosis not present

## 2020-12-11 DIAGNOSIS — R519 Headache, unspecified: Secondary | ICD-10-CM

## 2020-12-11 DIAGNOSIS — S0990XA Unspecified injury of head, initial encounter: Secondary | ICD-10-CM | POA: Diagnosis not present

## 2021-01-05 DIAGNOSIS — M25512 Pain in left shoulder: Secondary | ICD-10-CM | POA: Diagnosis not present

## 2021-01-09 DIAGNOSIS — R32 Unspecified urinary incontinence: Secondary | ICD-10-CM | POA: Diagnosis not present

## 2021-01-09 DIAGNOSIS — G4709 Other insomnia: Secondary | ICD-10-CM | POA: Diagnosis not present

## 2021-01-09 DIAGNOSIS — N189 Chronic kidney disease, unspecified: Secondary | ICD-10-CM | POA: Diagnosis not present

## 2021-01-09 DIAGNOSIS — G8929 Other chronic pain: Secondary | ICD-10-CM | POA: Diagnosis not present

## 2021-01-09 DIAGNOSIS — J301 Allergic rhinitis due to pollen: Secondary | ICD-10-CM | POA: Diagnosis not present

## 2021-01-09 DIAGNOSIS — I10 Essential (primary) hypertension: Secondary | ICD-10-CM | POA: Diagnosis not present

## 2021-01-09 DIAGNOSIS — I951 Orthostatic hypotension: Secondary | ICD-10-CM | POA: Diagnosis not present

## 2021-01-09 DIAGNOSIS — G4489 Other headache syndrome: Secondary | ICD-10-CM | POA: Diagnosis not present

## 2021-01-09 DIAGNOSIS — F411 Generalized anxiety disorder: Secondary | ICD-10-CM | POA: Diagnosis not present

## 2021-01-09 DIAGNOSIS — M810 Age-related osteoporosis without current pathological fracture: Secondary | ICD-10-CM | POA: Diagnosis not present

## 2021-01-09 DIAGNOSIS — K219 Gastro-esophageal reflux disease without esophagitis: Secondary | ICD-10-CM | POA: Diagnosis not present

## 2021-01-09 DIAGNOSIS — M7061 Trochanteric bursitis, right hip: Secondary | ICD-10-CM | POA: Diagnosis not present

## 2021-01-27 DIAGNOSIS — M25512 Pain in left shoulder: Secondary | ICD-10-CM | POA: Diagnosis not present

## 2021-02-03 DIAGNOSIS — M25512 Pain in left shoulder: Secondary | ICD-10-CM | POA: Diagnosis not present

## 2021-02-05 DIAGNOSIS — M25512 Pain in left shoulder: Secondary | ICD-10-CM | POA: Diagnosis not present

## 2021-02-10 DIAGNOSIS — M25512 Pain in left shoulder: Secondary | ICD-10-CM | POA: Diagnosis not present

## 2021-02-12 DIAGNOSIS — M25512 Pain in left shoulder: Secondary | ICD-10-CM | POA: Diagnosis not present

## 2021-02-17 DIAGNOSIS — S42292D Other displaced fracture of upper end of left humerus, subsequent encounter for fracture with routine healing: Secondary | ICD-10-CM | POA: Diagnosis not present

## 2021-02-17 DIAGNOSIS — M25512 Pain in left shoulder: Secondary | ICD-10-CM | POA: Diagnosis not present

## 2021-03-02 DIAGNOSIS — M810 Age-related osteoporosis without current pathological fracture: Secondary | ICD-10-CM | POA: Diagnosis not present

## 2021-03-02 DIAGNOSIS — E559 Vitamin D deficiency, unspecified: Secondary | ICD-10-CM | POA: Diagnosis not present

## 2021-03-04 DIAGNOSIS — R42 Dizziness and giddiness: Secondary | ICD-10-CM | POA: Insufficient documentation

## 2021-03-04 DIAGNOSIS — R2689 Other abnormalities of gait and mobility: Secondary | ICD-10-CM | POA: Diagnosis not present

## 2021-03-04 DIAGNOSIS — R413 Other amnesia: Secondary | ICD-10-CM | POA: Insufficient documentation

## 2021-03-04 DIAGNOSIS — R296 Repeated falls: Secondary | ICD-10-CM | POA: Diagnosis not present

## 2021-03-09 DIAGNOSIS — M810 Age-related osteoporosis without current pathological fracture: Secondary | ICD-10-CM | POA: Diagnosis not present

## 2021-03-09 DIAGNOSIS — E559 Vitamin D deficiency, unspecified: Secondary | ICD-10-CM | POA: Diagnosis not present

## 2021-03-20 DIAGNOSIS — G4709 Other insomnia: Secondary | ICD-10-CM | POA: Diagnosis not present

## 2021-03-20 DIAGNOSIS — Z5189 Encounter for other specified aftercare: Secondary | ICD-10-CM | POA: Diagnosis not present

## 2021-03-20 DIAGNOSIS — I10 Essential (primary) hypertension: Secondary | ICD-10-CM | POA: Diagnosis not present

## 2021-03-20 DIAGNOSIS — Z1389 Encounter for screening for other disorder: Secondary | ICD-10-CM | POA: Diagnosis not present

## 2021-03-20 DIAGNOSIS — Z1331 Encounter for screening for depression: Secondary | ICD-10-CM | POA: Diagnosis not present

## 2021-03-20 DIAGNOSIS — Z0001 Encounter for general adult medical examination with abnormal findings: Secondary | ICD-10-CM | POA: Diagnosis not present

## 2021-03-20 DIAGNOSIS — K219 Gastro-esophageal reflux disease without esophagitis: Secondary | ICD-10-CM | POA: Diagnosis not present

## 2021-03-20 DIAGNOSIS — M810 Age-related osteoporosis without current pathological fracture: Secondary | ICD-10-CM | POA: Diagnosis not present

## 2021-03-20 DIAGNOSIS — F411 Generalized anxiety disorder: Secondary | ICD-10-CM | POA: Diagnosis not present

## 2021-03-20 DIAGNOSIS — R32 Unspecified urinary incontinence: Secondary | ICD-10-CM | POA: Diagnosis not present

## 2021-03-20 DIAGNOSIS — E875 Hyperkalemia: Secondary | ICD-10-CM | POA: Diagnosis not present

## 2021-03-20 DIAGNOSIS — G8929 Other chronic pain: Secondary | ICD-10-CM | POA: Diagnosis not present

## 2021-03-20 DIAGNOSIS — J301 Allergic rhinitis due to pollen: Secondary | ICD-10-CM | POA: Diagnosis not present

## 2021-03-20 DIAGNOSIS — M7061 Trochanteric bursitis, right hip: Secondary | ICD-10-CM | POA: Diagnosis not present

## 2021-03-20 DIAGNOSIS — N189 Chronic kidney disease, unspecified: Secondary | ICD-10-CM | POA: Diagnosis not present

## 2021-03-20 DIAGNOSIS — G4489 Other headache syndrome: Secondary | ICD-10-CM | POA: Diagnosis not present

## 2021-03-23 DIAGNOSIS — M5134 Other intervertebral disc degeneration, thoracic region: Secondary | ICD-10-CM | POA: Diagnosis not present

## 2021-03-23 DIAGNOSIS — M9902 Segmental and somatic dysfunction of thoracic region: Secondary | ICD-10-CM | POA: Diagnosis not present

## 2021-03-23 DIAGNOSIS — M5416 Radiculopathy, lumbar region: Secondary | ICD-10-CM | POA: Diagnosis not present

## 2021-03-23 DIAGNOSIS — M9903 Segmental and somatic dysfunction of lumbar region: Secondary | ICD-10-CM | POA: Diagnosis not present

## 2021-03-25 DIAGNOSIS — M5134 Other intervertebral disc degeneration, thoracic region: Secondary | ICD-10-CM | POA: Diagnosis not present

## 2021-03-25 DIAGNOSIS — M5416 Radiculopathy, lumbar region: Secondary | ICD-10-CM | POA: Diagnosis not present

## 2021-03-25 DIAGNOSIS — M9902 Segmental and somatic dysfunction of thoracic region: Secondary | ICD-10-CM | POA: Diagnosis not present

## 2021-03-25 DIAGNOSIS — M9903 Segmental and somatic dysfunction of lumbar region: Secondary | ICD-10-CM | POA: Diagnosis not present

## 2021-03-26 DIAGNOSIS — M9903 Segmental and somatic dysfunction of lumbar region: Secondary | ICD-10-CM | POA: Diagnosis not present

## 2021-03-26 DIAGNOSIS — M5134 Other intervertebral disc degeneration, thoracic region: Secondary | ICD-10-CM | POA: Diagnosis not present

## 2021-03-26 DIAGNOSIS — M5416 Radiculopathy, lumbar region: Secondary | ICD-10-CM | POA: Diagnosis not present

## 2021-03-26 DIAGNOSIS — M810 Age-related osteoporosis without current pathological fracture: Secondary | ICD-10-CM | POA: Diagnosis not present

## 2021-03-26 DIAGNOSIS — M9902 Segmental and somatic dysfunction of thoracic region: Secondary | ICD-10-CM | POA: Diagnosis not present

## 2021-03-31 DIAGNOSIS — M9902 Segmental and somatic dysfunction of thoracic region: Secondary | ICD-10-CM | POA: Diagnosis not present

## 2021-03-31 DIAGNOSIS — M9903 Segmental and somatic dysfunction of lumbar region: Secondary | ICD-10-CM | POA: Diagnosis not present

## 2021-03-31 DIAGNOSIS — M5416 Radiculopathy, lumbar region: Secondary | ICD-10-CM | POA: Diagnosis not present

## 2021-03-31 DIAGNOSIS — M5134 Other intervertebral disc degeneration, thoracic region: Secondary | ICD-10-CM | POA: Diagnosis not present

## 2021-04-20 DIAGNOSIS — M5134 Other intervertebral disc degeneration, thoracic region: Secondary | ICD-10-CM | POA: Diagnosis not present

## 2021-04-20 DIAGNOSIS — M5416 Radiculopathy, lumbar region: Secondary | ICD-10-CM | POA: Diagnosis not present

## 2021-04-20 DIAGNOSIS — M9902 Segmental and somatic dysfunction of thoracic region: Secondary | ICD-10-CM | POA: Diagnosis not present

## 2021-04-20 DIAGNOSIS — M9903 Segmental and somatic dysfunction of lumbar region: Secondary | ICD-10-CM | POA: Diagnosis not present

## 2021-04-22 DIAGNOSIS — M5134 Other intervertebral disc degeneration, thoracic region: Secondary | ICD-10-CM | POA: Diagnosis not present

## 2021-04-22 DIAGNOSIS — M9902 Segmental and somatic dysfunction of thoracic region: Secondary | ICD-10-CM | POA: Diagnosis not present

## 2021-04-22 DIAGNOSIS — M9903 Segmental and somatic dysfunction of lumbar region: Secondary | ICD-10-CM | POA: Diagnosis not present

## 2021-04-22 DIAGNOSIS — M5416 Radiculopathy, lumbar region: Secondary | ICD-10-CM | POA: Diagnosis not present

## 2021-04-24 DIAGNOSIS — M9902 Segmental and somatic dysfunction of thoracic region: Secondary | ICD-10-CM | POA: Diagnosis not present

## 2021-04-24 DIAGNOSIS — M9903 Segmental and somatic dysfunction of lumbar region: Secondary | ICD-10-CM | POA: Diagnosis not present

## 2021-04-24 DIAGNOSIS — M5416 Radiculopathy, lumbar region: Secondary | ICD-10-CM | POA: Diagnosis not present

## 2021-04-24 DIAGNOSIS — M5134 Other intervertebral disc degeneration, thoracic region: Secondary | ICD-10-CM | POA: Diagnosis not present

## 2021-04-28 DIAGNOSIS — M7542 Impingement syndrome of left shoulder: Secondary | ICD-10-CM | POA: Diagnosis not present

## 2021-04-28 DIAGNOSIS — M7541 Impingement syndrome of right shoulder: Secondary | ICD-10-CM | POA: Diagnosis not present

## 2021-04-28 DIAGNOSIS — S42292D Other displaced fracture of upper end of left humerus, subsequent encounter for fracture with routine healing: Secondary | ICD-10-CM | POA: Diagnosis not present

## 2021-04-29 DIAGNOSIS — R296 Repeated falls: Secondary | ICD-10-CM | POA: Diagnosis not present

## 2021-04-29 DIAGNOSIS — R29898 Other symptoms and signs involving the musculoskeletal system: Secondary | ICD-10-CM | POA: Diagnosis not present

## 2021-05-13 DIAGNOSIS — R29898 Other symptoms and signs involving the musculoskeletal system: Secondary | ICD-10-CM | POA: Insufficient documentation

## 2021-07-08 DIAGNOSIS — E875 Hyperkalemia: Secondary | ICD-10-CM | POA: Diagnosis not present

## 2021-07-08 DIAGNOSIS — E785 Hyperlipidemia, unspecified: Secondary | ICD-10-CM | POA: Diagnosis not present

## 2021-07-08 DIAGNOSIS — I10 Essential (primary) hypertension: Secondary | ICD-10-CM | POA: Diagnosis not present

## 2021-07-10 DIAGNOSIS — G4709 Other insomnia: Secondary | ICD-10-CM | POA: Diagnosis not present

## 2021-07-10 DIAGNOSIS — F411 Generalized anxiety disorder: Secondary | ICD-10-CM | POA: Diagnosis not present

## 2021-07-10 DIAGNOSIS — M810 Age-related osteoporosis without current pathological fracture: Secondary | ICD-10-CM | POA: Diagnosis not present

## 2021-07-10 DIAGNOSIS — E785 Hyperlipidemia, unspecified: Secondary | ICD-10-CM | POA: Diagnosis not present

## 2021-07-10 DIAGNOSIS — G4489 Other headache syndrome: Secondary | ICD-10-CM | POA: Diagnosis not present

## 2021-07-10 DIAGNOSIS — M7061 Trochanteric bursitis, right hip: Secondary | ICD-10-CM | POA: Diagnosis not present

## 2021-07-10 DIAGNOSIS — I10 Essential (primary) hypertension: Secondary | ICD-10-CM | POA: Diagnosis not present

## 2021-07-10 DIAGNOSIS — G8929 Other chronic pain: Secondary | ICD-10-CM | POA: Diagnosis not present

## 2021-07-10 DIAGNOSIS — K219 Gastro-esophageal reflux disease without esophagitis: Secondary | ICD-10-CM | POA: Diagnosis not present

## 2021-07-10 DIAGNOSIS — J301 Allergic rhinitis due to pollen: Secondary | ICD-10-CM | POA: Diagnosis not present

## 2021-07-10 DIAGNOSIS — R32 Unspecified urinary incontinence: Secondary | ICD-10-CM | POA: Diagnosis not present

## 2021-07-10 DIAGNOSIS — N189 Chronic kidney disease, unspecified: Secondary | ICD-10-CM | POA: Diagnosis not present

## 2021-07-28 DIAGNOSIS — M7542 Impingement syndrome of left shoulder: Secondary | ICD-10-CM | POA: Diagnosis not present

## 2021-07-28 DIAGNOSIS — M7541 Impingement syndrome of right shoulder: Secondary | ICD-10-CM | POA: Diagnosis not present

## 2021-07-28 DIAGNOSIS — M25561 Pain in right knee: Secondary | ICD-10-CM | POA: Diagnosis not present

## 2021-07-28 DIAGNOSIS — M25562 Pain in left knee: Secondary | ICD-10-CM | POA: Diagnosis not present

## 2021-09-11 DIAGNOSIS — H26491 Other secondary cataract, right eye: Secondary | ICD-10-CM | POA: Diagnosis not present

## 2021-10-12 ENCOUNTER — Emergency Department (HOSPITAL_COMMUNITY)
Admission: EM | Admit: 2021-10-12 | Discharge: 2021-10-13 | Disposition: A | Discharge status: Home / Self Care | Payer: PPO | Attending: Emergency Medicine | Admitting: Emergency Medicine

## 2021-10-12 ENCOUNTER — Emergency Department (HOSPITAL_COMMUNITY): Payer: PPO

## 2021-10-12 ENCOUNTER — Other Ambulatory Visit: Payer: Self-pay

## 2021-10-12 DIAGNOSIS — Z87891 Personal history of nicotine dependence: Secondary | ICD-10-CM | POA: Diagnosis not present

## 2021-10-12 DIAGNOSIS — I10 Essential (primary) hypertension: Secondary | ICD-10-CM | POA: Diagnosis not present

## 2021-10-12 DIAGNOSIS — Z79899 Other long term (current) drug therapy: Secondary | ICD-10-CM | POA: Insufficient documentation

## 2021-10-12 DIAGNOSIS — S32592A Other specified fracture of left pubis, initial encounter for closed fracture: Secondary | ICD-10-CM | POA: Diagnosis not present

## 2021-10-12 DIAGNOSIS — W010XXA Fall on same level from slipping, tripping and stumbling without subsequent striking against object, initial encounter: Secondary | ICD-10-CM | POA: Diagnosis not present

## 2021-10-12 DIAGNOSIS — Y9301 Activity, walking, marching and hiking: Secondary | ICD-10-CM | POA: Insufficient documentation

## 2021-10-12 DIAGNOSIS — J449 Chronic obstructive pulmonary disease, unspecified: Secondary | ICD-10-CM | POA: Diagnosis not present

## 2021-10-12 DIAGNOSIS — S3994XA Unspecified injury of external genitals, initial encounter: Secondary | ICD-10-CM | POA: Diagnosis present

## 2021-10-12 DIAGNOSIS — R102 Pelvic and perineal pain: Secondary | ICD-10-CM | POA: Diagnosis not present

## 2021-10-12 DIAGNOSIS — S32512A Fracture of superior rim of left pubis, initial encounter for closed fracture: Secondary | ICD-10-CM | POA: Diagnosis not present

## 2021-10-12 DIAGNOSIS — Z853 Personal history of malignant neoplasm of breast: Secondary | ICD-10-CM | POA: Insufficient documentation

## 2021-10-12 DIAGNOSIS — Z91040 Latex allergy status: Secondary | ICD-10-CM | POA: Diagnosis not present

## 2021-10-12 DIAGNOSIS — M25552 Pain in left hip: Secondary | ICD-10-CM | POA: Diagnosis not present

## 2021-10-12 NOTE — ED Triage Notes (Addendum)
Pt tripped over a suitcase yesterday and fell. Endorses pain in bilateral shoulders, elbows, hips, and ankles, but states hips/pelvis area is hurting too bad to move her legs. Pt's daughter reports aside from family that there is high suspicion for abusing nasal spray and xanax. Daughter reports that although patient says she tripped over a suitcase, other reliable sources report she did not fall over a suitcase and was frequently using nasal spray and taking multiple xanax at a time while on a cruise last week.

## 2021-10-13 ENCOUNTER — Emergency Department (HOSPITAL_COMMUNITY): Payer: PPO

## 2021-10-13 DIAGNOSIS — M25552 Pain in left hip: Secondary | ICD-10-CM | POA: Diagnosis not present

## 2021-10-13 DIAGNOSIS — S32512A Fracture of superior rim of left pubis, initial encounter for closed fracture: Secondary | ICD-10-CM | POA: Diagnosis not present

## 2021-10-13 LAB — URINALYSIS, ROUTINE W REFLEX MICROSCOPIC
Bilirubin Urine: NEGATIVE
Glucose, UA: NEGATIVE mg/dL
Hgb urine dipstick: NEGATIVE
Ketones, ur: 20 mg/dL — AB
Nitrite: NEGATIVE
Protein, ur: NEGATIVE mg/dL
Specific Gravity, Urine: 1.014 (ref 1.005–1.030)
pH: 5 (ref 5.0–8.0)

## 2021-10-13 LAB — CBC WITH DIFFERENTIAL/PLATELET
Abs Immature Granulocytes: 0.04 10*3/uL (ref 0.00–0.07)
Basophils Absolute: 0 10*3/uL (ref 0.0–0.1)
Basophils Relative: 0 %
Eosinophils Absolute: 0.1 10*3/uL (ref 0.0–0.5)
Eosinophils Relative: 1 %
HCT: 43.7 % (ref 36.0–46.0)
Hemoglobin: 14.3 g/dL (ref 12.0–15.0)
Immature Granulocytes: 0 %
Lymphocytes Relative: 12 %
Lymphs Abs: 1.2 10*3/uL (ref 0.7–4.0)
MCH: 31.5 pg (ref 26.0–34.0)
MCHC: 32.7 g/dL (ref 30.0–36.0)
MCV: 96.3 fL (ref 80.0–100.0)
Monocytes Absolute: 1 10*3/uL (ref 0.1–1.0)
Monocytes Relative: 9 %
Neutro Abs: 8.2 10*3/uL — ABNORMAL HIGH (ref 1.7–7.7)
Neutrophils Relative %: 78 %
Platelets: 242 10*3/uL (ref 150–400)
RBC: 4.54 MIL/uL (ref 3.87–5.11)
RDW: 14 % (ref 11.5–15.5)
WBC: 10.6 10*3/uL — ABNORMAL HIGH (ref 4.0–10.5)
nRBC: 0 % (ref 0.0–0.2)

## 2021-10-13 LAB — MAGNESIUM: Magnesium: 2.1 mg/dL (ref 1.7–2.4)

## 2021-10-13 LAB — COMPREHENSIVE METABOLIC PANEL
ALT: 39 U/L (ref 0–44)
AST: 57 U/L — ABNORMAL HIGH (ref 15–41)
Albumin: 3.8 g/dL (ref 3.5–5.0)
Alkaline Phosphatase: 444 U/L — ABNORMAL HIGH (ref 38–126)
Anion gap: 13 (ref 5–15)
BUN: 27 mg/dL — ABNORMAL HIGH (ref 8–23)
CO2: 23 mmol/L (ref 22–32)
Calcium: 9.1 mg/dL (ref 8.9–10.3)
Chloride: 97 mmol/L — ABNORMAL LOW (ref 98–111)
Creatinine, Ser: 1.32 mg/dL — ABNORMAL HIGH (ref 0.44–1.00)
GFR, Estimated: 42 mL/min — ABNORMAL LOW (ref >60)
Glucose, Bld: 100 mg/dL — ABNORMAL HIGH (ref 70–99)
Potassium: 3.9 mmol/L (ref 3.5–5.1)
Sodium: 133 mmol/L — ABNORMAL LOW (ref 135–145)
Total Bilirubin: 1.6 mg/dL — ABNORMAL HIGH (ref 0.3–1.2)
Total Protein: 8 g/dL (ref 6.5–8.1)

## 2021-10-13 LAB — RAPID URINE DRUG SCREEN, HOSP PERFORMED
Amphetamines: NOT DETECTED
Barbiturates: NOT DETECTED
Benzodiazepines: POSITIVE — AB
Cocaine: NOT DETECTED
Opiates: NOT DETECTED
Tetrahydrocannabinol: NOT DETECTED

## 2021-10-13 MED ORDER — ACETAMINOPHEN 500 MG PO TABS
1000.0000 mg | ORAL_TABLET | Freq: Once | ORAL | Status: AC
  Administered 2021-10-13: 1000 mg via ORAL
  Filled 2021-10-13 (×2): qty 2

## 2021-10-13 MED ORDER — HYDROCODONE-ACETAMINOPHEN 5-325 MG PO TABS
1.0000 | ORAL_TABLET | ORAL | 0 refills | Status: DC | PRN
Start: 2021-10-13 — End: 2022-04-03

## 2021-10-13 MED ORDER — LACTATED RINGERS IV BOLUS
500.0000 mL | Freq: Once | INTRAVENOUS | Status: AC
  Administered 2021-10-13: 500 mL via INTRAVENOUS

## 2021-10-13 NOTE — ED Notes (Signed)
DC instructions reviewed with pt. Pt verbalized understanding.  PT DC.  

## 2021-10-13 NOTE — ED Notes (Signed)
Steady but slow ambulatory gait to BR and back

## 2021-10-13 NOTE — ED Provider Notes (Signed)
Pt signed out by Dr. Doren Custard pending ambulation.  Pt is able to ambulate with a walker.  She does have a walker at home, but it is old.  I wrote her a rx for a rolling walker with seat.  Pt has seen Dr. Harlow Mares with [facility] in [place] for various complaints.  She wants to follow up with him again.  Pt is to return if worse.     Donna Pence, MD 10/13/21 1737

## 2021-10-13 NOTE — ED Notes (Signed)
Gave pt something to drink

## 2021-10-13 NOTE — ED Provider Notes (Signed)
[facility] EMERGENCY DEPARTMENT Provider Note   CSN: [id number] Arrival date & time: 10/12/21  1636     History Chief Complaint  Patient presents with   Lytle Michaels    Donna Evans is a 75 y.o. female.   Fall Pertinent negatives include no chest pain, no abdominal pain, no headaches and no shortness of breath. Patient presents for left groin pain.  Onset of this groin pain was yesterday, following a mechanical fall.  Patient reports that she tripped over a suitcase and landed on her left side.  She subsequently had some diffuse body soreness but areas of soreness have resolved, except for the area of her left thigh and groin.  She has had increased difficulty with ambulation after this fall.  She has been walking with a walker.  Currently, she endorses proximal left leg pain but denies any other areas of discomfort.  Patient recently returned from a cruise.  In triage, patient's daughter expressed concern of Xanax and nasal Stadol abuse.  Patient states that she takes her Xanax only at night to fall asleep.  She denies that increased Xanax use has contributed to any falls.     Past Medical History:  Diagnosis Date   Allergic state    Anginal pain ([place])    Prinzmetal's angina   Anxiety    Arthritis    osteoarthritis   Breast cancer ([place]) 1990   right breast cancer   Chronic pain    Chronic pain    COPD (chronic obstructive pulmonary disease) (HCC)    Depression    Edema    Fibromyalgia    Foot fracture    Bilateral   GERD (gastroesophageal reflux disease)    History of kidney stones    Hypertension    Leg fracture, right    Low back pain    Lumbosacral neuritis    Medulloadrenal hyperfunction (HCC)    Migraine headache    Peripheral neuropathy    Shoulder fracture, right    Stroke ([place])    TIA   Systemic lupus erythematosus ([place])    Thyroid disease    TIA (transient ischemic attack) 11/28/2013   Transient global amnesia 2011    Patient Active  Problem List   Diagnosis Date Noted   Long term current use of opiate analgesic 07/04/2017   Long term prescription opiate use 07/04/2017   Opiate use 07/04/2017   Bursitis of shoulder 07/04/2017   Closed fracture of upper end of humerus 07/04/2017   Knee pain 07/04/2017   Localized, primary osteoarthritis 07/04/2017   Osteoarthritis of knee 07/04/2017   Pain in limb 07/04/2017   Sprain of wrist 07/04/2017   Pain in joint involving ankle and foot 07/04/2017   Impingement syndrome of left shoulder region 06/29/2017   Trochanteric bursitis 06/29/2017   AKI (acute kidney injury) ([place]) 10/01/2016   Syncope 09/30/2016   Migraine headache 08/10/2016   Controlled substance agreement signed 06/12/2016   Insomnia 06/12/2016   Hypertension, benign 02/04/2016   Easy bruisability 11/13/2015   Osteoporosis 11/13/2015   Fibromyalgia syndrome 11/13/2015   Generalized anxiety disorder 11/13/2015   Chronic pain syndrome 11/13/2015   Overactive detrusor 09/11/2015   Adrenal adenoma 09/02/2015   Chronic cystitis 09/02/2015   Incomplete bladder emptying 15/17/6160   Renal colic 73/71/0626   Gross hematuria 07/28/2015   History of reconstruction of both breasts 06/18/2015   Acquired absence of both breasts and nipples 11/20/2014   Transient cerebral ischemia 11/28/2013   Hemiplegia of  dominant side ([place]) 11/20/2013    Past Surgical History:  Procedure Laterality Date   ABDOMINAL HYSTERECTOMY  1981   APPENDECTOMY  1957   AUGMENTATION MAMMAPLASTY Bilateral 1990   saline submuscular   BACK SURGERY  1989   BILATERAL TOTAL MASTECTOMY WITH AXILLARY LYMPH NODE DISSECTION  1990   BREAST IMPLANT EXCHANGE Bilateral 05/11/2016   Procedure: REMOVAL AND REPLACEMENT OF BREAST IMPLANTS ;  Surgeon: Irene Limbo, MD;  Location: [place];  Service: Plastics;  Laterality: Bilateral;   CAPSULECTOMY Bilateral 05/11/2016   Procedure: CAPSULECTOMY CAPSULORRAPHY ;  Surgeon: Irene Limbo,  MD;  Location: [place];  Service: Plastics;  Laterality: Bilateral;   CATARACT EXTRACTION W/PHACO Right 12/26/2018   Procedure: CATARACT EXTRACTION PHACO AND INTRAOCULAR LENS PLACEMENT ([place]) RIGHT;  Surgeon: Birder Robson, MD;  Location: [facility];  Service: Ophthalmology;  Laterality: Right;  [place]  00:46 CDE 8.50 Fluid pack lot # [id number]   CATARACT EXTRACTION W/PHACO Left 01/23/2019   Procedure: CATARACT EXTRACTION PHACO AND INTRAOCULAR LENS PLACEMENT ([place]) LEFT;  Surgeon: Birder Robson, MD;  Location: [facility];  Service: Ophthalmology;  Laterality: Left;  [place]  00:45 CDE  8.41 fluid pack lot # [id number]   CHOLECYSTECTOMY  1979   COLONOSCOPY WITH PROPOFOL N/A 01/25/2018   Procedure: COLONOSCOPY WITH PROPOFOL;  Surgeon: Manya Silvas, MD;  Location: [facility];  Service: Endoscopy;  Laterality: N/A;   COPD     ESOPHAGOGASTRODUODENOSCOPY (EGD) WITH PROPOFOL N/A 03/07/2018   Procedure: ESOPHAGOGASTRODUODENOSCOPY (EGD) WITH PROPOFOL;  Surgeon: Manya Silvas, MD;  Location: [facility] ENDOSCOPY;  Service: Endoscopy;  Laterality: N/A;   FRACTURE SURGERY     HARDWARE REMOVAL Left 06/18/2020   Procedure: HARDWARE REMOVAL;  Surgeon: Lovell Sheehan, MD;  Location: [facility];  Service: Orthopedics;  Laterality: Left;   HUMERUS IM NAIL Left 12/31/2019   Procedure: INTRAMEDULLARY (IM) NAIL HUMERAL;  Surgeon: Lovell Sheehan, MD;  Location: [facility];  Service: Orthopedics;  Laterality: Left;   LIPOSUCTION Right 05/11/2016   Procedure: LIPOSUCTION;  Surgeon: Irene Limbo, MD;  Location: [place];  Service: Plastics;  Laterality: Right;   LIVER BIOPSY  2011   MASTECTOMY SUBCUTANEOUS Bilateral 1990   MASTOPEXY Bilateral 05/11/2016   Procedure: MASTOPEXY BILATERAL ;  Surgeon: Irene Limbo, MD;  Location: [place];  Service: Plastics;  Laterality: Bilateral;     OB History   No obstetric history on file.     Family History  Problem Relation  Age of Onset   Atrial fibrillation Mother    Atrial fibrillation Sister    Cancer Sister    Diabetes Sister    Breast cancer Sister 44   Diabetes Father    Cancer Sister    Diabetes Sister    Atrial fibrillation Sister    Kidney disease Maternal Aunt     Social History   Tobacco Use   Smoking status: Former    Packs/day: 1.00    Years: 40.00    Pack years: 40.00    Types: Cigarettes    Quit date: 08/28/2005    Years since quitting: 16.1   Smokeless tobacco: Never  Vaping Use   Vaping Use: Never used  Substance Use Topics   Alcohol use: Yes    Alcohol/week: 0.0 standard drinks    Comment: rare 2 per month   Drug use: No    Home Medications Prior to Admission medications   Medication Sig Start Date End Date Taking? Authorizing Provider  HYDROcodone-acetaminophen (NORCO/VICODIN) 5-325 MG tablet Take 1 tablet by mouth every 4 (four) hours as needed. 10/13/21  Yes Isla Pence, MD  ALPRAZolam Duanne Moron) 1 MG tablet Take 1 tablet (1 mg total) by mouth 3 (three) times daily as needed for anxiety. Patient taking differently: Take 1 mg by mouth 2 (two) times daily.  10/22/16   Kathrine Haddock, NP  alum & mag hydroxide-simeth (MYLANTA) 200-200-20 MG/5ML suspension Take 15 mLs by mouth every 6 (six) hours as needed for indigestion or heartburn.    [provider]  butorphanol (STADOL) 10 MG/ML nasal spray Place 1 spray into the nose every 4 (four) hours as needed for headache. 10/22/16   Kathrine Haddock, NP  calcium carbonate (TUMS - DOSED IN MG ELEMENTAL CALCIUM) 500 MG chewable tablet Chew 1 tablet by mouth 3 (three) times daily as needed for indigestion or heartburn.    [provider]  Cholecalciferol (DIALYVITE VITAMIN D3 MAX) 1.25 MG (50000 UT) TABS Take 50,000 mcg by mouth once a week. Sunday     [provider]  Cyanocobalamin (VITAMIN B-12 IJ) Inject 1,000 mg as directed every 30 (thirty) days.    [provider]  cyclobenzaprine (FLEXERIL) 10  MG tablet Take 10 mg by mouth 3 (three) times daily as needed for muscle spasms.    [provider]  hydrochlorothiazide (HYDRODIURIL) 12.5 MG tablet Take 1 tablet (12.5 mg total) by mouth 2 (two) times daily. 10/22/16   Kathrine Haddock, NP  torsemide (DEMADEX) 20 MG tablet Take 20 mg by mouth daily as needed (fluid).    [provider]  zolpidem (AMBIEN) 10 MG tablet Take 1 tablet (10 mg total) by mouth at bedtime. 10/22/16   Kathrine Haddock, NP    Allergies    Codeine, Demerol [meperidine], Latex, Morphine and related, Ciprofloxacin, Doxycycline, Flagyl [metronidazole], Fluconazole, Influenza vaccines, Tape, Valtrex [valacyclovir hcl], Duloxetine hcl, Amoxicillin-pot clavulanate, Sulfamethoxazole-trimethoprim, and Valacyclovir  Review of Systems   Review of Systems  Constitutional:  Negative for activity change, appetite change, chills, fatigue and fever.  HENT:  Negative for congestion, ear pain and sore throat.   Eyes:  Negative for pain and visual disturbance.  Respiratory:  Negative for cough, chest tightness and shortness of breath.   Cardiovascular:  Negative for chest pain, palpitations and leg swelling.  Gastrointestinal:  Negative for abdominal pain, diarrhea, nausea and vomiting.  Genitourinary:  Negative for dysuria, flank pain, hematuria and pelvic pain.  Musculoskeletal:  Positive for arthralgias and gait problem. Negative for back pain, joint swelling, myalgias and neck pain.  Skin:  Negative for color change, rash and wound.  Neurological:  Negative for dizziness, seizures, syncope, weakness, light-headedness, numbness and headaches.  Hematological:  Does not bruise/bleed easily.  Psychiatric/Behavioral:  Negative for confusion and decreased concentration.   All other systems reviewed and are negative.  Physical Exam Updated Vital Signs BP (!) 121/45   Pulse 71   Temp 98.5 F (36.9 C) (Oral)   Resp 15   SpO2 98%   Physical Exam Vitals and nursing  note reviewed.  Constitutional:      General: She is not in acute distress.    Appearance: Normal appearance. She is well-developed and normal weight. She is not ill-appearing, toxic-appearing or diaphoretic.  HENT:     Head: Normocephalic and atraumatic.     Right Ear: External ear normal.     Left Ear: External ear normal.     Nose: Nose normal.     Mouth/Throat:  Mouth: Mucous membranes are moist.     Pharynx: Oropharynx is clear.  Eyes:     Extraocular Movements: Extraocular movements intact.     Conjunctiva/sclera: Conjunctivae normal.  Cardiovascular:     Rate and Rhythm: Normal rate and regular rhythm.     Heart sounds: No murmur heard. Pulmonary:     Effort: Pulmonary effort is normal. No respiratory distress.     Breath sounds: Normal breath sounds.  Chest:     Chest wall: No tenderness.  Abdominal:     Palpations: Abdomen is soft.     Tenderness: There is no abdominal tenderness.  Musculoskeletal:        General: No swelling, tenderness, deformity or signs of injury. Normal range of motion.     Cervical back: Normal range of motion and neck supple. No rigidity or tenderness.  Skin:    General: Skin is warm and dry.     Coloration: Skin is not jaundiced or pale.  Neurological:     General: No focal deficit present.     Mental Status: She is alert and oriented to person, place, and time.     Cranial Nerves: No cranial nerve deficit.     Sensory: No sensory deficit.     Motor: No weakness.  Psychiatric:        Mood and Affect: Mood normal.        Behavior: Behavior normal.    ED Results / Procedures / Treatments   Labs (all labs ordered are listed, but only abnormal results are displayed) Labs Reviewed  CBC WITH DIFFERENTIAL/PLATELET - Abnormal; Notable for the following components:      Result Value   WBC 10.6 (*)    Neutro Abs 8.2 (*)    All other components within normal limits  COMPREHENSIVE METABOLIC PANEL - Abnormal; Notable for the following  components:   Sodium 133 (*)    Chloride 97 (*)    Glucose, Bld 100 (*)    BUN 27 (*)    Creatinine, Ser 1.32 (*)    AST 57 (*)    Alkaline Phosphatase 444 (*)    Total Bilirubin 1.6 (*)    GFR, Estimated 42 (*)    All other components within normal limits  URINALYSIS, ROUTINE W REFLEX MICROSCOPIC - Abnormal; Notable for the following components:   Color, Urine AMBER (*)    APPearance HAZY (*)    Ketones, ur 20 (*)    Leukocytes,Ua SMALL (*)    Bacteria, UA RARE (*)    Non Squamous Epithelial 0-5 (*)    All other components within normal limits  RAPID URINE DRUG SCREEN, HOSP PERFORMED - Abnormal; Notable for the following components:   Benzodiazepines POSITIVE (*)    All other components within normal limits  MAGNESIUM    EKG None  Radiology DG Pelvis 1-2 Views  Result Date: 10/12/2021 CLINICAL DATA:  Fall, bilateral hip pain EXAM: PELVIS - 1-2 VIEW COMPARISON:  None. FINDINGS: There is no evidence of pelvic fracture or diastasis. No pelvic bone lesions are seen. IMPRESSION: Negative. Electronically Signed   By: Fidela Salisbury M.D.   On: 10/12/2021 19:21   CT Hip Left Wo Contrast  Result Date: 10/13/2021 CLINICAL DATA:  Left hip pain after fall. EXAM: CT OF THE LEFT HIP WITHOUT CONTRAST TECHNIQUE: Multidetector CT imaging of the left hip was performed according to the standard protocol. Multiplanar CT image reconstructions were also generated. COMPARISON:  Left hip x-rays from same day. FINDINGS: Bones/Joint/Cartilage Acute  nondisplaced fracture of the left parasymphyseal superior pubic ramus. No additional fracture. No dislocation. Joint spaces are preserved. No joint effusion. Ligaments Ligaments are suboptimally evaluated by CT. Muscles and Tendons Grossly intact.  Gluteus minimus muscle atrophy. Soft tissue No fluid collection or hematoma.  No soft tissue mass. IMPRESSION: 1. Acute nondisplaced fracture of the left parasymphyseal superior pubic ramus. Electronically Signed    By: Titus Dubin M.D.   On: 10/13/2021 16:20   DG Hip Unilat W or Wo Pelvis 2-3 Views Left  Result Date: 10/13/2021 CLINICAL DATA:  Fall, left hip pain EXAM: DG HIP (WITH OR WITHOUT PELVIS) 2-3V LEFT COMPARISON:  None. FINDINGS: There is no evidence of hip fracture or dislocation. There is no evidence of arthropathy or other focal bone abnormality. IMPRESSION: Negative. Electronically Signed   By: Rolm Baptise M.D.   On: 10/13/2021 15:00    Procedures Procedures   Medications Ordered in ED Medications  acetaminophen (TYLENOL) tablet 1,000 mg (1,000 mg Oral Given 10/13/21 1431)  lactated ringers bolus 500 mL (0 mLs Intravenous Stopped 10/13/21 1719)    ED Course  I have reviewed the triage vital signs and the nursing notes.  Pertinent labs & imaging results that were available during my care of the patient were reviewed by me and considered in my medical decision making (see chart for details).    MDM Rules/Calculators/A&P                          Patient presents for left proximal leg pain following a fall yesterday.  Prior to being bedded in the ED, pelvic x-ray was obtained which did not show clear evidence of any fractures.  Left femur was slightly rotated during this x-ray study.  Left hip x-rays were ordered to further characterize this area.  On exam, patient resting comfortably.  She has no focal neurologic deficits.  Left hip flexion is limited by pain.  Following negative left hip x-ray, CT scan of left hip was ordered to identify occult fracture.  On CT imaging, patient does have a nondisplaced fracture of her left superior pelvic ramus.  Patient was informed of this result.  She does have an orthopedic doctor that she follows with.  She was advised that she can weight-bear as tolerated in fracture will likely heal with time, without complication.  Patient to undergo trial of ambulation prior to discharge.  Care of patient was signed out to oncoming ED provider.  Final Clinical  Impression(s) / ED Diagnoses Final diagnoses:  Pubic ramus fracture, left, closed, initial encounter ([place])    Rx / DC Orders ED Discharge Orders          Ordered    HYDROcodone-acetaminophen (NORCO/VICODIN) 5-325 MG tablet  Every 4 hours PRN        10/13/21 1734    For home use only DME 4 wheeled rolling walker with seat        10/13/21 1735             Godfrey Pick, MD 10/14/21 [phone]

## 2021-10-13 NOTE — ED Notes (Signed)
Pt sitting up in stretcher, GCS 15, sister at bedside. Pt in no acute distress, call light in reach, VSS. RN started IV to obtain labs, prompted urine, awaiting CT.

## 2021-10-20 DIAGNOSIS — E785 Hyperlipidemia, unspecified: Secondary | ICD-10-CM | POA: Diagnosis not present

## 2021-10-20 DIAGNOSIS — M7541 Impingement syndrome of right shoulder: Secondary | ICD-10-CM | POA: Diagnosis not present

## 2021-10-20 DIAGNOSIS — M7542 Impingement syndrome of left shoulder: Secondary | ICD-10-CM | POA: Diagnosis not present

## 2021-10-20 DIAGNOSIS — N189 Chronic kidney disease, unspecified: Secondary | ICD-10-CM | POA: Diagnosis not present

## 2021-10-20 DIAGNOSIS — S329XXA Fracture of unspecified parts of lumbosacral spine and pelvis, initial encounter for closed fracture: Secondary | ICD-10-CM | POA: Diagnosis not present

## 2021-10-27 DIAGNOSIS — E785 Hyperlipidemia, unspecified: Secondary | ICD-10-CM | POA: Diagnosis not present

## 2021-10-27 DIAGNOSIS — M7061 Trochanteric bursitis, right hip: Secondary | ICD-10-CM | POA: Diagnosis not present

## 2021-10-27 DIAGNOSIS — G4489 Other headache syndrome: Secondary | ICD-10-CM | POA: Diagnosis not present

## 2021-10-27 DIAGNOSIS — K219 Gastro-esophageal reflux disease without esophagitis: Secondary | ICD-10-CM | POA: Diagnosis not present

## 2021-10-27 DIAGNOSIS — G8929 Other chronic pain: Secondary | ICD-10-CM | POA: Diagnosis not present

## 2021-10-27 DIAGNOSIS — N189 Chronic kidney disease, unspecified: Secondary | ICD-10-CM | POA: Diagnosis not present

## 2021-10-27 DIAGNOSIS — J301 Allergic rhinitis due to pollen: Secondary | ICD-10-CM | POA: Diagnosis not present

## 2021-10-27 DIAGNOSIS — F411 Generalized anxiety disorder: Secondary | ICD-10-CM | POA: Diagnosis not present

## 2021-10-27 DIAGNOSIS — G4709 Other insomnia: Secondary | ICD-10-CM | POA: Diagnosis not present

## 2021-10-27 DIAGNOSIS — R32 Unspecified urinary incontinence: Secondary | ICD-10-CM | POA: Diagnosis not present

## 2021-10-27 DIAGNOSIS — Z23 Encounter for immunization: Secondary | ICD-10-CM | POA: Diagnosis not present

## 2021-10-27 DIAGNOSIS — I10 Essential (primary) hypertension: Secondary | ICD-10-CM | POA: Diagnosis not present

## 2021-10-27 DIAGNOSIS — M810 Age-related osteoporosis without current pathological fracture: Secondary | ICD-10-CM | POA: Diagnosis not present

## 2021-10-29 DIAGNOSIS — G894 Chronic pain syndrome: Secondary | ICD-10-CM | POA: Diagnosis not present

## 2021-10-29 DIAGNOSIS — K219 Gastro-esophageal reflux disease without esophagitis: Secondary | ICD-10-CM | POA: Diagnosis not present

## 2021-10-29 DIAGNOSIS — I10 Essential (primary) hypertension: Secondary | ICD-10-CM | POA: Diagnosis not present

## 2021-10-29 DIAGNOSIS — F411 Generalized anxiety disorder: Secondary | ICD-10-CM | POA: Diagnosis not present

## 2021-10-29 DIAGNOSIS — Z9181 History of falling: Secondary | ICD-10-CM | POA: Diagnosis not present

## 2021-10-29 DIAGNOSIS — N3020 Other chronic cystitis without hematuria: Secondary | ICD-10-CM | POA: Diagnosis not present

## 2021-10-29 DIAGNOSIS — M80052D Age-related osteoporosis with current pathological fracture, left femur, subsequent encounter for fracture with routine healing: Secondary | ICD-10-CM | POA: Diagnosis not present

## 2021-10-29 DIAGNOSIS — M797 Fibromyalgia: Secondary | ICD-10-CM | POA: Diagnosis not present

## 2021-10-29 DIAGNOSIS — J449 Chronic obstructive pulmonary disease, unspecified: Secondary | ICD-10-CM | POA: Diagnosis not present

## 2021-10-29 DIAGNOSIS — W010XXD Fall on same level from slipping, tripping and stumbling without subsequent striking against object, subsequent encounter: Secondary | ICD-10-CM | POA: Diagnosis not present

## 2021-10-29 DIAGNOSIS — Z853 Personal history of malignant neoplasm of breast: Secondary | ICD-10-CM | POA: Diagnosis not present

## 2021-10-29 DIAGNOSIS — M7060 Trochanteric bursitis, unspecified hip: Secondary | ICD-10-CM | POA: Diagnosis not present

## 2021-10-29 DIAGNOSIS — G629 Polyneuropathy, unspecified: Secondary | ICD-10-CM | POA: Diagnosis not present

## 2021-10-29 DIAGNOSIS — G43909 Migraine, unspecified, not intractable, without status migrainosus: Secondary | ICD-10-CM | POA: Diagnosis not present

## 2021-10-29 DIAGNOSIS — I201 Angina pectoris with documented spasm: Secondary | ICD-10-CM | POA: Diagnosis not present

## 2021-10-29 DIAGNOSIS — Z8673 Personal history of transient ischemic attack (TIA), and cerebral infarction without residual deficits: Secondary | ICD-10-CM | POA: Diagnosis not present

## 2021-10-29 DIAGNOSIS — G4700 Insomnia, unspecified: Secondary | ICD-10-CM | POA: Diagnosis not present

## 2021-10-29 DIAGNOSIS — M7541 Impingement syndrome of right shoulder: Secondary | ICD-10-CM | POA: Diagnosis not present

## 2021-10-29 DIAGNOSIS — M800AXD Age-related osteoporosis with current pathological fracture, other site, subsequent encounter for fracture with routine healing: Secondary | ICD-10-CM | POA: Diagnosis not present

## 2021-10-29 DIAGNOSIS — M179 Osteoarthritis of knee, unspecified: Secondary | ICD-10-CM | POA: Diagnosis not present

## 2021-10-29 DIAGNOSIS — Z9013 Acquired absence of bilateral breasts and nipples: Secondary | ICD-10-CM | POA: Diagnosis not present

## 2021-10-29 DIAGNOSIS — F32A Depression, unspecified: Secondary | ICD-10-CM | POA: Diagnosis not present

## 2021-10-29 DIAGNOSIS — G8191 Hemiplegia, unspecified affecting right dominant side: Secondary | ICD-10-CM | POA: Diagnosis not present

## 2021-10-29 DIAGNOSIS — Z8781 Personal history of (healed) traumatic fracture: Secondary | ICD-10-CM | POA: Diagnosis not present

## 2021-10-29 DIAGNOSIS — M8008XD Age-related osteoporosis with current pathological fracture, vertebra(e), subsequent encounter for fracture with routine healing: Secondary | ICD-10-CM | POA: Diagnosis not present

## 2021-10-29 DIAGNOSIS — E079 Disorder of thyroid, unspecified: Secondary | ICD-10-CM | POA: Diagnosis not present

## 2021-10-29 DIAGNOSIS — M329 Systemic lupus erythematosus, unspecified: Secondary | ICD-10-CM | POA: Diagnosis not present

## 2021-10-29 DIAGNOSIS — Z87891 Personal history of nicotine dependence: Secondary | ICD-10-CM | POA: Diagnosis not present

## 2021-11-07 DIAGNOSIS — Z87891 Personal history of nicotine dependence: Secondary | ICD-10-CM | POA: Diagnosis not present

## 2021-11-07 DIAGNOSIS — I10 Essential (primary) hypertension: Secondary | ICD-10-CM | POA: Diagnosis not present

## 2021-11-07 DIAGNOSIS — K219 Gastro-esophageal reflux disease without esophagitis: Secondary | ICD-10-CM | POA: Diagnosis not present

## 2021-11-07 DIAGNOSIS — G4700 Insomnia, unspecified: Secondary | ICD-10-CM | POA: Diagnosis not present

## 2021-11-07 DIAGNOSIS — Z8781 Personal history of (healed) traumatic fracture: Secondary | ICD-10-CM | POA: Diagnosis not present

## 2021-11-07 DIAGNOSIS — G629 Polyneuropathy, unspecified: Secondary | ICD-10-CM | POA: Diagnosis not present

## 2021-11-07 DIAGNOSIS — G894 Chronic pain syndrome: Secondary | ICD-10-CM | POA: Diagnosis not present

## 2021-11-07 DIAGNOSIS — F32A Depression, unspecified: Secondary | ICD-10-CM | POA: Diagnosis not present

## 2021-11-07 DIAGNOSIS — F411 Generalized anxiety disorder: Secondary | ICD-10-CM | POA: Diagnosis not present

## 2021-11-07 DIAGNOSIS — M8008XD Age-related osteoporosis with current pathological fracture, vertebra(e), subsequent encounter for fracture with routine healing: Secondary | ICD-10-CM | POA: Diagnosis not present

## 2021-11-07 DIAGNOSIS — G43909 Migraine, unspecified, not intractable, without status migrainosus: Secondary | ICD-10-CM | POA: Diagnosis not present

## 2021-11-07 DIAGNOSIS — I201 Angina pectoris with documented spasm: Secondary | ICD-10-CM | POA: Diagnosis not present

## 2021-11-07 DIAGNOSIS — Z8673 Personal history of transient ischemic attack (TIA), and cerebral infarction without residual deficits: Secondary | ICD-10-CM | POA: Diagnosis not present

## 2021-11-07 DIAGNOSIS — Z9181 History of falling: Secondary | ICD-10-CM | POA: Diagnosis not present

## 2021-11-07 DIAGNOSIS — M797 Fibromyalgia: Secondary | ICD-10-CM | POA: Diagnosis not present

## 2021-11-07 DIAGNOSIS — M329 Systemic lupus erythematosus, unspecified: Secondary | ICD-10-CM | POA: Diagnosis not present

## 2021-11-07 DIAGNOSIS — Z9013 Acquired absence of bilateral breasts and nipples: Secondary | ICD-10-CM | POA: Diagnosis not present

## 2021-11-07 DIAGNOSIS — M7541 Impingement syndrome of right shoulder: Secondary | ICD-10-CM | POA: Diagnosis not present

## 2021-11-07 DIAGNOSIS — M80052D Age-related osteoporosis with current pathological fracture, left femur, subsequent encounter for fracture with routine healing: Secondary | ICD-10-CM | POA: Diagnosis not present

## 2021-11-07 DIAGNOSIS — Z853 Personal history of malignant neoplasm of breast: Secondary | ICD-10-CM | POA: Diagnosis not present

## 2021-11-07 DIAGNOSIS — W010XXD Fall on same level from slipping, tripping and stumbling without subsequent striking against object, subsequent encounter: Secondary | ICD-10-CM | POA: Diagnosis not present

## 2021-11-07 DIAGNOSIS — M179 Osteoarthritis of knee, unspecified: Secondary | ICD-10-CM | POA: Diagnosis not present

## 2021-11-07 DIAGNOSIS — J449 Chronic obstructive pulmonary disease, unspecified: Secondary | ICD-10-CM | POA: Diagnosis not present

## 2021-11-07 DIAGNOSIS — G8191 Hemiplegia, unspecified affecting right dominant side: Secondary | ICD-10-CM | POA: Diagnosis not present

## 2021-11-13 DIAGNOSIS — S329XXA Fracture of unspecified parts of lumbosacral spine and pelvis, initial encounter for closed fracture: Secondary | ICD-10-CM | POA: Insufficient documentation

## 2021-11-24 DIAGNOSIS — I10 Essential (primary) hypertension: Secondary | ICD-10-CM | POA: Diagnosis not present

## 2021-11-24 DIAGNOSIS — Z8781 Personal history of (healed) traumatic fracture: Secondary | ICD-10-CM | POA: Diagnosis not present

## 2021-11-24 DIAGNOSIS — M8008XD Age-related osteoporosis with current pathological fracture, vertebra(e), subsequent encounter for fracture with routine healing: Secondary | ICD-10-CM | POA: Diagnosis not present

## 2021-11-24 DIAGNOSIS — M329 Systemic lupus erythematosus, unspecified: Secondary | ICD-10-CM | POA: Diagnosis not present

## 2021-11-24 DIAGNOSIS — F411 Generalized anxiety disorder: Secondary | ICD-10-CM | POA: Diagnosis not present

## 2021-11-24 DIAGNOSIS — Z87891 Personal history of nicotine dependence: Secondary | ICD-10-CM | POA: Diagnosis not present

## 2021-11-24 DIAGNOSIS — Z8673 Personal history of transient ischemic attack (TIA), and cerebral infarction without residual deficits: Secondary | ICD-10-CM | POA: Diagnosis not present

## 2021-11-24 DIAGNOSIS — Z9181 History of falling: Secondary | ICD-10-CM | POA: Diagnosis not present

## 2021-11-24 DIAGNOSIS — M179 Osteoarthritis of knee, unspecified: Secondary | ICD-10-CM | POA: Diagnosis not present

## 2021-11-24 DIAGNOSIS — M7541 Impingement syndrome of right shoulder: Secondary | ICD-10-CM | POA: Diagnosis not present

## 2021-11-24 DIAGNOSIS — G8191 Hemiplegia, unspecified affecting right dominant side: Secondary | ICD-10-CM | POA: Diagnosis not present

## 2021-11-24 DIAGNOSIS — M797 Fibromyalgia: Secondary | ICD-10-CM | POA: Diagnosis not present

## 2021-11-24 DIAGNOSIS — G43909 Migraine, unspecified, not intractable, without status migrainosus: Secondary | ICD-10-CM | POA: Diagnosis not present

## 2021-11-24 DIAGNOSIS — K219 Gastro-esophageal reflux disease without esophagitis: Secondary | ICD-10-CM | POA: Diagnosis not present

## 2021-11-24 DIAGNOSIS — G629 Polyneuropathy, unspecified: Secondary | ICD-10-CM | POA: Diagnosis not present

## 2021-11-24 DIAGNOSIS — G894 Chronic pain syndrome: Secondary | ICD-10-CM | POA: Diagnosis not present

## 2021-11-24 DIAGNOSIS — J449 Chronic obstructive pulmonary disease, unspecified: Secondary | ICD-10-CM | POA: Diagnosis not present

## 2021-11-24 DIAGNOSIS — W010XXD Fall on same level from slipping, tripping and stumbling without subsequent striking against object, subsequent encounter: Secondary | ICD-10-CM | POA: Diagnosis not present

## 2021-11-24 DIAGNOSIS — M80052D Age-related osteoporosis with current pathological fracture, left femur, subsequent encounter for fracture with routine healing: Secondary | ICD-10-CM | POA: Diagnosis not present

## 2021-11-24 DIAGNOSIS — G4700 Insomnia, unspecified: Secondary | ICD-10-CM | POA: Diagnosis not present

## 2021-11-24 DIAGNOSIS — Z853 Personal history of malignant neoplasm of breast: Secondary | ICD-10-CM | POA: Diagnosis not present

## 2021-11-24 DIAGNOSIS — Z9013 Acquired absence of bilateral breasts and nipples: Secondary | ICD-10-CM | POA: Diagnosis not present

## 2021-11-24 DIAGNOSIS — F32A Depression, unspecified: Secondary | ICD-10-CM | POA: Diagnosis not present

## 2021-11-24 DIAGNOSIS — I201 Angina pectoris with documented spasm: Secondary | ICD-10-CM | POA: Diagnosis not present

## 2021-12-01 DIAGNOSIS — S329XXD Fracture of unspecified parts of lumbosacral spine and pelvis, subsequent encounter for fracture with routine healing: Secondary | ICD-10-CM | POA: Diagnosis not present

## 2021-12-03 DIAGNOSIS — M7541 Impingement syndrome of right shoulder: Secondary | ICD-10-CM | POA: Diagnosis not present

## 2021-12-03 DIAGNOSIS — K219 Gastro-esophageal reflux disease without esophagitis: Secondary | ICD-10-CM | POA: Diagnosis not present

## 2021-12-03 DIAGNOSIS — M179 Osteoarthritis of knee, unspecified: Secondary | ICD-10-CM | POA: Diagnosis not present

## 2021-12-03 DIAGNOSIS — Z9013 Acquired absence of bilateral breasts and nipples: Secondary | ICD-10-CM | POA: Diagnosis not present

## 2021-12-03 DIAGNOSIS — F411 Generalized anxiety disorder: Secondary | ICD-10-CM | POA: Diagnosis not present

## 2021-12-03 DIAGNOSIS — M797 Fibromyalgia: Secondary | ICD-10-CM | POA: Diagnosis not present

## 2021-12-03 DIAGNOSIS — G629 Polyneuropathy, unspecified: Secondary | ICD-10-CM | POA: Diagnosis not present

## 2021-12-03 DIAGNOSIS — F32A Depression, unspecified: Secondary | ICD-10-CM | POA: Diagnosis not present

## 2021-12-03 DIAGNOSIS — M8008XD Age-related osteoporosis with current pathological fracture, vertebra(e), subsequent encounter for fracture with routine healing: Secondary | ICD-10-CM | POA: Diagnosis not present

## 2021-12-03 DIAGNOSIS — I201 Angina pectoris with documented spasm: Secondary | ICD-10-CM | POA: Diagnosis not present

## 2021-12-03 DIAGNOSIS — Z853 Personal history of malignant neoplasm of breast: Secondary | ICD-10-CM | POA: Diagnosis not present

## 2021-12-03 DIAGNOSIS — J449 Chronic obstructive pulmonary disease, unspecified: Secondary | ICD-10-CM | POA: Diagnosis not present

## 2021-12-03 DIAGNOSIS — G43909 Migraine, unspecified, not intractable, without status migrainosus: Secondary | ICD-10-CM | POA: Diagnosis not present

## 2021-12-03 DIAGNOSIS — Z9181 History of falling: Secondary | ICD-10-CM | POA: Diagnosis not present

## 2021-12-03 DIAGNOSIS — Z8781 Personal history of (healed) traumatic fracture: Secondary | ICD-10-CM | POA: Diagnosis not present

## 2021-12-03 DIAGNOSIS — G4700 Insomnia, unspecified: Secondary | ICD-10-CM | POA: Diagnosis not present

## 2021-12-03 DIAGNOSIS — M329 Systemic lupus erythematosus, unspecified: Secondary | ICD-10-CM | POA: Diagnosis not present

## 2021-12-03 DIAGNOSIS — G894 Chronic pain syndrome: Secondary | ICD-10-CM | POA: Diagnosis not present

## 2021-12-03 DIAGNOSIS — M80052D Age-related osteoporosis with current pathological fracture, left femur, subsequent encounter for fracture with routine healing: Secondary | ICD-10-CM | POA: Diagnosis not present

## 2021-12-03 DIAGNOSIS — W010XXD Fall on same level from slipping, tripping and stumbling without subsequent striking against object, subsequent encounter: Secondary | ICD-10-CM | POA: Diagnosis not present

## 2021-12-03 DIAGNOSIS — Z8673 Personal history of transient ischemic attack (TIA), and cerebral infarction without residual deficits: Secondary | ICD-10-CM | POA: Diagnosis not present

## 2021-12-03 DIAGNOSIS — I10 Essential (primary) hypertension: Secondary | ICD-10-CM | POA: Diagnosis not present

## 2021-12-03 DIAGNOSIS — G8191 Hemiplegia, unspecified affecting right dominant side: Secondary | ICD-10-CM | POA: Diagnosis not present

## 2021-12-03 DIAGNOSIS — Z87891 Personal history of nicotine dependence: Secondary | ICD-10-CM | POA: Diagnosis not present

## 2021-12-11 DIAGNOSIS — M7061 Trochanteric bursitis, right hip: Secondary | ICD-10-CM | POA: Diagnosis not present

## 2021-12-11 DIAGNOSIS — R32 Unspecified urinary incontinence: Secondary | ICD-10-CM | POA: Diagnosis not present

## 2021-12-11 DIAGNOSIS — G4489 Other headache syndrome: Secondary | ICD-10-CM | POA: Diagnosis not present

## 2021-12-11 DIAGNOSIS — N189 Chronic kidney disease, unspecified: Secondary | ICD-10-CM | POA: Diagnosis not present

## 2021-12-11 DIAGNOSIS — G8929 Other chronic pain: Secondary | ICD-10-CM | POA: Diagnosis not present

## 2021-12-11 DIAGNOSIS — K219 Gastro-esophageal reflux disease without esophagitis: Secondary | ICD-10-CM | POA: Diagnosis not present

## 2021-12-11 DIAGNOSIS — J301 Allergic rhinitis due to pollen: Secondary | ICD-10-CM | POA: Diagnosis not present

## 2021-12-11 DIAGNOSIS — E785 Hyperlipidemia, unspecified: Secondary | ICD-10-CM | POA: Diagnosis not present

## 2021-12-11 DIAGNOSIS — M810 Age-related osteoporosis without current pathological fracture: Secondary | ICD-10-CM | POA: Diagnosis not present

## 2021-12-11 DIAGNOSIS — F411 Generalized anxiety disorder: Secondary | ICD-10-CM | POA: Diagnosis not present

## 2021-12-11 DIAGNOSIS — G4709 Other insomnia: Secondary | ICD-10-CM | POA: Diagnosis not present

## 2021-12-11 DIAGNOSIS — I10 Essential (primary) hypertension: Secondary | ICD-10-CM | POA: Diagnosis not present

## 2021-12-15 ENCOUNTER — Emergency Department: Payer: PPO

## 2021-12-15 ENCOUNTER — Other Ambulatory Visit: Payer: Self-pay

## 2021-12-15 ENCOUNTER — Encounter: Payer: Self-pay | Admitting: *Deleted

## 2021-12-15 DIAGNOSIS — J449 Chronic obstructive pulmonary disease, unspecified: Secondary | ICD-10-CM | POA: Insufficient documentation

## 2021-12-15 DIAGNOSIS — S52502A Unspecified fracture of the lower end of left radius, initial encounter for closed fracture: Secondary | ICD-10-CM | POA: Insufficient documentation

## 2021-12-15 DIAGNOSIS — G319 Degenerative disease of nervous system, unspecified: Secondary | ICD-10-CM | POA: Diagnosis not present

## 2021-12-15 DIAGNOSIS — M7989 Other specified soft tissue disorders: Secondary | ICD-10-CM | POA: Diagnosis not present

## 2021-12-15 DIAGNOSIS — Y9301 Activity, walking, marching and hiking: Secondary | ICD-10-CM | POA: Diagnosis not present

## 2021-12-15 DIAGNOSIS — S6992XA Unspecified injury of left wrist, hand and finger(s), initial encounter: Secondary | ICD-10-CM | POA: Diagnosis not present

## 2021-12-15 DIAGNOSIS — R609 Edema, unspecified: Secondary | ICD-10-CM | POA: Diagnosis not present

## 2021-12-15 DIAGNOSIS — M549 Dorsalgia, unspecified: Secondary | ICD-10-CM | POA: Diagnosis not present

## 2021-12-15 DIAGNOSIS — I10 Essential (primary) hypertension: Secondary | ICD-10-CM | POA: Insufficient documentation

## 2021-12-15 DIAGNOSIS — R52 Pain, unspecified: Secondary | ICD-10-CM | POA: Diagnosis not present

## 2021-12-15 DIAGNOSIS — Y92009 Unspecified place in unspecified non-institutional (private) residence as the place of occurrence of the external cause: Secondary | ICD-10-CM | POA: Diagnosis not present

## 2021-12-15 DIAGNOSIS — S0083XA Contusion of other part of head, initial encounter: Secondary | ICD-10-CM | POA: Diagnosis not present

## 2021-12-15 DIAGNOSIS — Z853 Personal history of malignant neoplasm of breast: Secondary | ICD-10-CM | POA: Insufficient documentation

## 2021-12-15 DIAGNOSIS — W010XXA Fall on same level from slipping, tripping and stumbling without subsequent striking against object, initial encounter: Secondary | ICD-10-CM | POA: Diagnosis not present

## 2021-12-15 DIAGNOSIS — R220 Localized swelling, mass and lump, head: Secondary | ICD-10-CM | POA: Diagnosis not present

## 2021-12-15 DIAGNOSIS — M2578 Osteophyte, vertebrae: Secondary | ICD-10-CM | POA: Diagnosis not present

## 2021-12-15 NOTE — ED Triage Notes (Signed)
Pt brought in via ems from home. Pt fell with her walker onto the floor.  Pt has swelling and bruising to left side of face and nose.  Pt has deformity of left wrist.  Pt has avulsion to lower lip.   No loc  no n/v  etoh use today.  Pt has neck and back pain.     Pt alert  speech clear.

## 2021-12-16 ENCOUNTER — Emergency Department
Admission: EM | Admit: 2021-12-16 | Discharge: 2021-12-16 | Disposition: A | Discharge status: Home / Self Care | Payer: PPO | Attending: Emergency Medicine | Admitting: Emergency Medicine

## 2021-12-16 DIAGNOSIS — W19XXXA Unspecified fall, initial encounter: Secondary | ICD-10-CM

## 2021-12-16 DIAGNOSIS — S52502A Unspecified fracture of the lower end of left radius, initial encounter for closed fracture: Secondary | ICD-10-CM

## 2021-12-16 DIAGNOSIS — S0083XA Contusion of other part of head, initial encounter: Secondary | ICD-10-CM

## 2021-12-16 MED ORDER — ACETAMINOPHEN 500 MG PO TABS: 1000.0000 mg | ORAL_TABLET | Freq: Three times a day (TID) | ORAL | 0 refills | Status: DC | PRN

## 2021-12-16 MED ORDER — TRAMADOL HCL 50 MG PO TABS
ORAL_TABLET | ORAL | Status: AC
  Filled 2021-12-16: qty 1

## 2021-12-16 MED ORDER — ACETAMINOPHEN 500 MG PO TABS
1000.0000 mg | ORAL_TABLET | Freq: Once | ORAL | Status: AC
  Administered 2021-12-16: 1000 mg via ORAL

## 2021-12-16 MED ORDER — ACETAMINOPHEN 500 MG PO TABS
ORAL_TABLET | ORAL | Status: AC
  Filled 2021-12-16: qty 2

## 2021-12-16 MED ORDER — TRAMADOL HCL 50 MG PO TABS
50.0000 mg | ORAL_TABLET | Freq: Once | ORAL | Status: AC
  Administered 2021-12-16: 50 mg via ORAL

## 2021-12-16 MED ORDER — TRAMADOL HCL 50 MG PO TABS: 50.0000 mg | ORAL_TABLET | Freq: Four times a day (QID) | ORAL | 0 refills | Status: DC | PRN

## 2021-12-16 NOTE — ED Provider Notes (Signed)
[facility] Provider Note    Event Date/Time   First MD Initiated Contact with Patient 12/16/21 0335     (approximate)   History   Fall   HPI  Donna Evans is a 76 y.o. female with a history of chronic pain, fibromyalgia, hypertension, frequent falls, TIA, who presents for evaluation of another fall.  Patient reports that she was walking with her walker at home when she tripped and fell.  She is complaining of left wrist pain and facial pain.  She reports that she hit her face on the floor.  Patient reports positive alcohol this evening.  Patient was able to crawl to her phone and call for help.  She is not on blood thinners.  She denies LOC.  She reports that the fall was mechanical in nature.  She is complaining of moderate constant throbbing pain worse in the left side of her face and her wrist.  She denies neck pain, back pain, abdominal pain, chest pain or shortness of breath     Past Medical History:  Diagnosis Date   Allergic state    Anginal pain ([place])    Prinzmetal's angina   Anxiety    Arthritis    osteoarthritis   Breast cancer ([place]) 1990   right breast cancer   Chronic pain    Chronic pain    COPD (chronic obstructive pulmonary disease) (HCC)    Depression    Edema    Fibromyalgia    Foot fracture    Bilateral   GERD (gastroesophageal reflux disease)    History of kidney stones    Hypertension    Leg fracture, right    Low back pain    Lumbosacral neuritis    Medulloadrenal hyperfunction (HCC)    Migraine headache    Peripheral neuropathy    Shoulder fracture, right    Stroke ([place])    TIA   Systemic lupus erythematosus ([place])    Thyroid disease    TIA (transient ischemic attack) 11/28/2013   Transient global amnesia 2011    Past Surgical History:  Procedure Laterality Date   ABDOMINAL HYSTERECTOMY  1981   APPENDECTOMY  1957   AUGMENTATION MAMMAPLASTY Bilateral 1990   saline submuscular   BACK SURGERY  1989    BILATERAL TOTAL MASTECTOMY WITH AXILLARY LYMPH NODE DISSECTION  1990   BREAST IMPLANT EXCHANGE Bilateral 05/11/2016   Procedure: REMOVAL AND REPLACEMENT OF BREAST IMPLANTS ;  Surgeon: Irene Limbo, MD;  Location: [place];  Service: Plastics;  Laterality: Bilateral;   CAPSULECTOMY Bilateral 05/11/2016   Procedure: CAPSULECTOMY CAPSULORRAPHY ;  Surgeon: Irene Limbo, MD;  Location: [place];  Service: Plastics;  Laterality: Bilateral;   CATARACT EXTRACTION W/PHACO Right 12/26/2018   Procedure: CATARACT EXTRACTION PHACO AND INTRAOCULAR LENS PLACEMENT ([place]) RIGHT;  Surgeon: Birder Robson, MD;  Location: [facility];  Service: Ophthalmology;  Laterality: Right;  [place]  00:46 CDE 8.50 Fluid pack lot # [id number]   CATARACT EXTRACTION W/PHACO Left 01/23/2019   Procedure: CATARACT EXTRACTION PHACO AND INTRAOCULAR LENS PLACEMENT ([place]) LEFT;  Surgeon: Birder Robson, MD;  Location: [facility];  Service: Ophthalmology;  Laterality: Left;  [place]  00:45 CDE  8.41 fluid pack lot # [id number]   CHOLECYSTECTOMY  1979   COLONOSCOPY WITH PROPOFOL N/A 01/25/2018   Procedure: COLONOSCOPY WITH PROPOFOL;  Surgeon: Manya Silvas, MD;  Location: [facility];  Service: Endoscopy;  Laterality: N/A;   COPD     ESOPHAGOGASTRODUODENOSCOPY (EGD)  WITH PROPOFOL N/A 03/07/2018   Procedure: ESOPHAGOGASTRODUODENOSCOPY (EGD) WITH PROPOFOL;  Surgeon: Manya Silvas, MD;  Location: [facility] ENDOSCOPY;  Service: Endoscopy;  Laterality: N/A;   FRACTURE SURGERY     HARDWARE REMOVAL Left 06/18/2020   Procedure: HARDWARE REMOVAL;  Surgeon: Lovell Sheehan, MD;  Location: [facility];  Service: Orthopedics;  Laterality: Left;   HUMERUS IM NAIL Left 12/31/2019   Procedure: INTRAMEDULLARY (IM) NAIL HUMERAL;  Surgeon: Lovell Sheehan, MD;  Location: [facility];  Service: Orthopedics;  Laterality: Left;   LIPOSUCTION Right 05/11/2016   Procedure: LIPOSUCTION;  Surgeon: Irene Limbo, MD;  Location: [place];  Service: Plastics;  Laterality: Right;   LIVER BIOPSY  2011   MASTECTOMY SUBCUTANEOUS Bilateral 1990   MASTOPEXY Bilateral 05/11/2016   Procedure: MASTOPEXY BILATERAL ;  Surgeon: Irene Limbo, MD;  Location: [place];  Service: Plastics;  Laterality: Bilateral;     Physical Exam   Triage Vital Signs: ED Triage Vitals  Enc Vitals Group     BP 12/15/21 2047 (!) 141/84     Pulse Rate 12/15/21 2047 77     Resp 12/15/21 2047 20     Temp 12/15/21 2047 98.2 F (36.8 C)     Temp Source 12/15/21 2047 Oral     SpO2 12/15/21 2047 96 %     Weight 12/15/21 2043 133 lb (60.3 kg)     Height 12/15/21 2043 5\' 7"  (1.702 m)     Head Circumference --      Peak Flow --      Pain Score 12/15/21 2043 10     Pain Loc --      Pain Edu? --      Excl. in [place]? --     Most recent vital signs: Vitals:   12/16/21 0415 12/16/21 0422  BP: 91/65 131/67  Pulse: 78 74  Resp: 14 14  Temp:    SpO2: 100% 99%    Full spinal precautions maintained throughout the trauma exam. Constitutional: Alert and oriented. No acute distress. Does not appear intoxicated. HEENT Head: Normocephalic and atraumatic. Face: No facial bony tenderness. Stable midface, L sided facial bruising Ears: No hemotympanum bilaterally. No Battle sign Eyes: No eye injury. PERRL. No raccoon eyes Nose: Nontender. No epistaxis. No rhinorrhea Mouth/Throat: Mucous membranes are moist. No oropharyngeal blood. No dental injury. Airway patent without stridor. Normal voice. Neck: no C-collar. No midline c-spine tenderness.  Cardiovascular: Normal rate, regular rhythm. Normal and symmetric distal pulses are present in all extremities. Pulmonary/Chest: Chest wall is stable and nontender to palpation/compression. Normal respiratory effort. Breath sounds are normal. No crepitus.  Abdominal: Soft, nontender, non distended. Musculoskeletal: Swelling and closed deformity of the left wrist with reduced range of  motion.  Nontender with normal full range of motion in all other extremities. No thoracic or lumbar midline spinal tenderness. Pelvis is stable. Skin: Skin is warm, dry and intact. No abrasions or contutions. Psychiatric: Speech and behavior are appropriate. Neurological: Normal speech and language. Moves all extremities to command. No gross focal neurologic deficits are appreciated.  Glascow Coma Score: 4 - Opens eyes on own 6 - Follows simple motor commands 5 - Alert and oriented GCS: 15   ED Results / Procedures / Treatments   Labs (all labs ordered are listed, but only abnormal results are displayed) Labs Reviewed - No data to display   EKG  none   RADIOLOGY I have personally reviewed the images performed during this visit  and I agree with the Radiologist's read.   Interpretation by Radiologist:  DG Wrist Complete Left  Result Date: 12/15/2021 CLINICAL DATA:  Fall, left wrist injury EXAM: LEFT WRIST - COMPLETE 3+ VIEW COMPARISON:  None. FINDINGS: There is an acute transverse impaction fracture of the distal left radius with buckling of the dorsal periarticular cortex and neutral tilt of the distal radial articular surface. Radiocarpal alignment appears preserved. There is, additionally, a minimally displaced anatomically aligned fracture of the distal tip of the ulnar styloid. Moderate surrounding soft tissue swelling. No other fracture or dislocation. IMPRESSION: Transverse impaction fracture of the distal radius and minimally displaced fracture of the ulnar styloid as described above. Electronically Signed   By: Fidela Salisbury M.D.   On: 12/15/2021 21:05   CT Head Wo Contrast  Result Date: 12/15/2021 CLINICAL DATA:  Status post fall. EXAM: CT HEAD WITHOUT CONTRAST TECHNIQUE: Contiguous axial images were obtained from the base of the skull through the vertex without intravenous contrast. COMPARISON:  None. FINDINGS: Brain: There is mild cerebral atrophy with widening of the  extra-axial spaces and ventricular dilatation. There are areas of decreased attenuation within the white matter tracts of the supratentorial brain, consistent with microvascular disease changes. Vascular: No hyperdense vessel or unexpected calcification. Skull: Normal. Negative for fracture or focal lesion. Sinuses/Orbits: No acute finding. Other: There is moderate severity left facial and left periorbital soft tissue swelling. IMPRESSION: Moderate severity left facial and left periorbital soft tissue swelling without evidence of an acute fracture or acute intracranial abnormality. Electronically Signed   By: Virgina Norfolk M.D.   On: 12/15/2021 21:29   CT Cervical Spine Wo Contrast  Result Date: 12/15/2021 CLINICAL DATA:  Status post fall. EXAM: CT CERVICAL SPINE WITHOUT CONTRAST TECHNIQUE: Multidetector CT imaging of the cervical spine was performed without intravenous contrast. Multiplanar CT image reconstructions were also generated. COMPARISON:  None. FINDINGS: Alignment: Normal. Skull base and vertebrae: No acute fracture. No primary bone lesion or focal pathologic process. Soft tissues and spinal canal: No prevertebral fluid or swelling. No visible canal hematoma. Disc levels: There is mild anterior osteophyte formation at the level of C3-C4. Moderate severity endplate sclerosis is seen at the levels of C4-C5, C5-C6 and C6-C7. Mild to moderate severity intervertebral disc space narrowing is seen at C2-C3 and C3-C4. Moderate severity intervertebral disc space narrowing is seen at the levels of C4-C5, C5-C6 and C6-C7. Marked severity bilateral multilevel facet joint hypertrophy is noted. Upper chest: There is mild biapical scarring and/or atelectasis. Other: None. IMPRESSION: 1. Moderate severity multilevel degenerative changes, most prominent at the levels of C4-C5, C5-C6 and C6-C7. 2. No evidence of an acute fracture or subluxation. Electronically Signed   By: Virgina Norfolk M.D.   On: 12/15/2021  21:42   CT Maxillofacial Wo Contrast  Result Date: 12/15/2021 CLINICAL DATA:  Status post fall. EXAM: CT MAXILLOFACIAL WITHOUT CONTRAST TECHNIQUE: Multidetector CT imaging of the maxillofacial structures was performed. Multiplanar CT image reconstructions were also generated. COMPARISON:  None. FINDINGS: Osseous: No fracture or mandibular dislocation. No destructive process. Orbits: Negative. No traumatic or inflammatory finding. Sinuses: An 8 mm x 5 mm sphenoid sinus polyp versus mucous retention cyst is seen. A similar appearing 13 mm x 7 mm lesion is seen along the medial wall of the left maxillary sinus. Soft tissues: Moderate to marked severity left-sided facial and left periorbital soft tissue swelling is seen. Limited intracranial: No significant or unexpected finding. IMPRESSION: Moderate to marked severity left-sided facial and left periorbital  soft tissue swelling without an acute osseous abnormality. Electronically Signed   By: Virgina Norfolk M.D.   On: 12/15/2021 21:47      PROCEDURES:  Critical Care performed: No  .Ortho Injury Treatment  Date/Time: 12/16/2021 4:47 AM Performed by: Rudene Re, MD Authorized by: Rudene Re, MD   Consent:    Consent obtained:  Verbal   Consent given by:  Patient   Risks discussed:  Restricted joint movement, stiffness and nerve damage   Alternatives discussed:  Alternative treatmentInjury location: wrist Location details: left wrist Injury type: fracture Fracture type: distal radius and ulnar styloid Pre-procedure neurovascular assessment: neurovascularly intact Pre-procedure distal perfusion: normal Pre-procedure neurological function: normal Pre-procedure range of motion: reduced Manipulation performed: no Immobilization: splint Splint type: sugar tong Splint Applied by: ED Tech Supplies used: cotton padding, elastic bandage and Ortho-Glass Post-procedure neurovascular assessment: post-procedure neurovascularly  intact Post-procedure distal perfusion: normal Post-procedure neurological function: normal Post-procedure range of motion: unchanged      IMPRESSION / MDM / [place] / ED COURSE  I reviewed the triage vital signs and the nursing notes.  76 y.o. female with a history of chronic pain, fibromyalgia, hypertension, frequent falls, TIA, who presents for evaluation of another fall.  Patient is well-appearing no distress, she has bruising to the left side of her face with no eye involvement, no bony deformity, no midline spine tenderness, obvious closed deformity of the left wrist.  No other injuries based on history and physical exam.  Patient with a history of recurrent falls.   Ddx: dislocation vs fracture of the wrist, facial bone fracture or contusion   Plan: CT head, C-spine, maxillofacial.  X-ray of the wrist   MEDICATIONS GIVEN IN ED: Medications  acetaminophen (TYLENOL) tablet 1,000 mg (has no administration in time range)  traMADol (ULTRAM) tablet 50 mg (has no administration in time range)     ED COURSE: All images were reviewed and interpreted by me and confirmed by radiology.  No signs of facial fracture, no acute intracranial or C-spine traumatic injuries.  Patient was found to have a transverse impaction fracture of the distal radius for which she was splinted per procedure note above.  She will be referred to [facility] who she has seen in the past for other traumatic injuries.  Patient has a history of frequent falls. Most likely a combination of polypharmacy, alcohol, and balance issues.  No indication for lab work at this time since patient had them recently and they were all unremarkable.  I did discuss with patient and about staying in the ER for PT evaluation to see if patient would benefit from rehab placement.  Patient does deny 1 to wait another 6 hours to be seen by physical therapy.  She does have a PT person that comes to her house twice a week.  She is  supposed to see this person today.  I recommended that she discuss with her physical therapist a more formal evaluation to see if she would need rehab and consult her PCP to help arrange placement at rehab if that is needed.  Also discussed that she can return to the emergency room for help if she is unable to have that done as an outpatient.  We did talk about preventing falls at home.  Patient was given a short course of tramadol for pain.  Patient was discharged to the care of her friend who was at bedside.   Consults: None   EMR reviewed I did review patient's  EMR including her last visit with Dr. Melrose Nakayama for frequent falls from May 2020          FINAL CLINICAL IMPRESSION(S) / ED DIAGNOSES   Final diagnoses:  Fall, initial encounter  Closed fracture of distal end of left radius, unspecified fracture morphology, initial encounter  Contusion of face, initial encounter     Rx / DC Orders   ED Discharge Orders          Ordered    acetaminophen (TYLENOL) 500 MG tablet  Every 8 hours PRN        12/16/21 0435    traMADol (ULTRAM) 50 MG tablet  Every 6 hours PRN        12/16/21 0435             Note:  This document was prepared using Dragon voice recognition software and may include unintentional dictation errors.    Alfred Levins, [place], MD 12/16/21 [phone]

## 2021-12-16 NOTE — Discharge Instructions (Addendum)
Pain control: Take tylenol 1000mg every 8 hours. Take 50mg of tramadol every 6 hours for breakthrough pain. If you need the tramadol make sure to take one senokot as well to prevent constipation. ° °Do not drink alcohol, drive or participate in any other potentially dangerous activities while taking this medication as it may make you sleepy. Do not take this medication with any other sedating medications, either prescription or over-the-counter. ° °

## 2021-12-17 DIAGNOSIS — M545 Low back pain, unspecified: Secondary | ICD-10-CM | POA: Diagnosis not present

## 2021-12-17 DIAGNOSIS — M797 Fibromyalgia: Secondary | ICD-10-CM | POA: Diagnosis not present

## 2021-12-17 DIAGNOSIS — M47896 Other spondylosis, lumbar region: Secondary | ICD-10-CM | POA: Diagnosis not present

## 2021-12-17 DIAGNOSIS — S6292XA Unspecified fracture of left wrist and hand, initial encounter for closed fracture: Secondary | ICD-10-CM | POA: Diagnosis not present

## 2021-12-18 ENCOUNTER — Ambulatory Visit
Admission: RE | Admit: 2021-12-18 | Discharge: 2021-12-18 | Disposition: A | Discharge status: Home / Self Care | Payer: PPO | Attending: Internal Medicine | Admitting: Internal Medicine

## 2021-12-18 ENCOUNTER — Ambulatory Visit
Admission: RE | Admit: 2021-12-18 | Discharge: 2021-12-18 | Disposition: A | Discharge status: Home / Self Care | Payer: PPO | Source: Ambulatory Visit | Attending: Internal Medicine | Admitting: Internal Medicine

## 2021-12-18 ENCOUNTER — Other Ambulatory Visit: Payer: Self-pay | Admitting: Internal Medicine

## 2021-12-18 ENCOUNTER — Other Ambulatory Visit: Payer: Self-pay | Admitting: Clinical Neurophysiology

## 2021-12-18 DIAGNOSIS — R0781 Pleurodynia: Secondary | ICD-10-CM | POA: Insufficient documentation

## 2021-12-18 DIAGNOSIS — W19XXXA Unspecified fall, initial encounter: Secondary | ICD-10-CM | POA: Diagnosis not present

## 2021-12-18 DIAGNOSIS — Z9049 Acquired absence of other specified parts of digestive tract: Secondary | ICD-10-CM | POA: Insufficient documentation

## 2021-12-18 DIAGNOSIS — R0600 Dyspnea, unspecified: Secondary | ICD-10-CM | POA: Insufficient documentation

## 2021-12-18 DIAGNOSIS — Z9013 Acquired absence of bilateral breasts and nipples: Secondary | ICD-10-CM | POA: Insufficient documentation

## 2021-12-18 DIAGNOSIS — I700 Atherosclerosis of aorta: Secondary | ICD-10-CM | POA: Diagnosis not present

## 2021-12-21 DIAGNOSIS — Z8673 Personal history of transient ischemic attack (TIA), and cerebral infarction without residual deficits: Secondary | ICD-10-CM | POA: Diagnosis not present

## 2021-12-21 DIAGNOSIS — G4700 Insomnia, unspecified: Secondary | ICD-10-CM | POA: Diagnosis not present

## 2021-12-21 DIAGNOSIS — M8008XD Age-related osteoporosis with current pathological fracture, vertebra(e), subsequent encounter for fracture with routine healing: Secondary | ICD-10-CM | POA: Diagnosis not present

## 2021-12-21 DIAGNOSIS — Z9013 Acquired absence of bilateral breasts and nipples: Secondary | ICD-10-CM | POA: Diagnosis not present

## 2021-12-21 DIAGNOSIS — M179 Osteoarthritis of knee, unspecified: Secondary | ICD-10-CM | POA: Diagnosis not present

## 2021-12-21 DIAGNOSIS — Z8781 Personal history of (healed) traumatic fracture: Secondary | ICD-10-CM | POA: Diagnosis not present

## 2021-12-21 DIAGNOSIS — W010XXD Fall on same level from slipping, tripping and stumbling without subsequent striking against object, subsequent encounter: Secondary | ICD-10-CM | POA: Diagnosis not present

## 2021-12-21 DIAGNOSIS — G894 Chronic pain syndrome: Secondary | ICD-10-CM | POA: Diagnosis not present

## 2021-12-21 DIAGNOSIS — I10 Essential (primary) hypertension: Secondary | ICD-10-CM | POA: Diagnosis not present

## 2021-12-21 DIAGNOSIS — M329 Systemic lupus erythematosus, unspecified: Secondary | ICD-10-CM | POA: Diagnosis not present

## 2021-12-21 DIAGNOSIS — G8191 Hemiplegia, unspecified affecting right dominant side: Secondary | ICD-10-CM | POA: Diagnosis not present

## 2021-12-21 DIAGNOSIS — K219 Gastro-esophageal reflux disease without esophagitis: Secondary | ICD-10-CM | POA: Diagnosis not present

## 2021-12-21 DIAGNOSIS — F32A Depression, unspecified: Secondary | ICD-10-CM | POA: Diagnosis not present

## 2021-12-21 DIAGNOSIS — M7541 Impingement syndrome of right shoulder: Secondary | ICD-10-CM | POA: Diagnosis not present

## 2021-12-21 DIAGNOSIS — Z853 Personal history of malignant neoplasm of breast: Secondary | ICD-10-CM | POA: Diagnosis not present

## 2021-12-21 DIAGNOSIS — F411 Generalized anxiety disorder: Secondary | ICD-10-CM | POA: Diagnosis not present

## 2021-12-21 DIAGNOSIS — Z87891 Personal history of nicotine dependence: Secondary | ICD-10-CM | POA: Diagnosis not present

## 2021-12-21 DIAGNOSIS — Z9181 History of falling: Secondary | ICD-10-CM | POA: Diagnosis not present

## 2021-12-21 DIAGNOSIS — I201 Angina pectoris with documented spasm: Secondary | ICD-10-CM | POA: Diagnosis not present

## 2021-12-21 DIAGNOSIS — M797 Fibromyalgia: Secondary | ICD-10-CM | POA: Diagnosis not present

## 2021-12-21 DIAGNOSIS — J449 Chronic obstructive pulmonary disease, unspecified: Secondary | ICD-10-CM | POA: Diagnosis not present

## 2021-12-21 DIAGNOSIS — G629 Polyneuropathy, unspecified: Secondary | ICD-10-CM | POA: Diagnosis not present

## 2021-12-21 DIAGNOSIS — G43909 Migraine, unspecified, not intractable, without status migrainosus: Secondary | ICD-10-CM | POA: Diagnosis not present

## 2021-12-21 DIAGNOSIS — M80052D Age-related osteoporosis with current pathological fracture, left femur, subsequent encounter for fracture with routine healing: Secondary | ICD-10-CM | POA: Diagnosis not present

## 2021-12-22 DIAGNOSIS — S52522A Torus fracture of lower end of left radius, initial encounter for closed fracture: Secondary | ICD-10-CM | POA: Diagnosis not present

## 2021-12-22 DIAGNOSIS — S52502A Unspecified fracture of the lower end of left radius, initial encounter for closed fracture: Secondary | ICD-10-CM | POA: Insufficient documentation

## 2021-12-24 DIAGNOSIS — Z9181 History of falling: Secondary | ICD-10-CM | POA: Diagnosis not present

## 2021-12-24 DIAGNOSIS — Z853 Personal history of malignant neoplasm of breast: Secondary | ICD-10-CM | POA: Diagnosis not present

## 2021-12-24 DIAGNOSIS — Z8673 Personal history of transient ischemic attack (TIA), and cerebral infarction without residual deficits: Secondary | ICD-10-CM | POA: Diagnosis not present

## 2021-12-24 DIAGNOSIS — M8008XD Age-related osteoporosis with current pathological fracture, vertebra(e), subsequent encounter for fracture with routine healing: Secondary | ICD-10-CM | POA: Diagnosis not present

## 2021-12-24 DIAGNOSIS — I201 Angina pectoris with documented spasm: Secondary | ICD-10-CM | POA: Diagnosis not present

## 2021-12-24 DIAGNOSIS — M80052D Age-related osteoporosis with current pathological fracture, left femur, subsequent encounter for fracture with routine healing: Secondary | ICD-10-CM | POA: Diagnosis not present

## 2021-12-24 DIAGNOSIS — G629 Polyneuropathy, unspecified: Secondary | ICD-10-CM | POA: Diagnosis not present

## 2021-12-24 DIAGNOSIS — M7541 Impingement syndrome of right shoulder: Secondary | ICD-10-CM | POA: Diagnosis not present

## 2021-12-24 DIAGNOSIS — G894 Chronic pain syndrome: Secondary | ICD-10-CM | POA: Diagnosis not present

## 2021-12-24 DIAGNOSIS — F32A Depression, unspecified: Secondary | ICD-10-CM | POA: Diagnosis not present

## 2021-12-24 DIAGNOSIS — G43909 Migraine, unspecified, not intractable, without status migrainosus: Secondary | ICD-10-CM | POA: Diagnosis not present

## 2021-12-24 DIAGNOSIS — F411 Generalized anxiety disorder: Secondary | ICD-10-CM | POA: Diagnosis not present

## 2021-12-24 DIAGNOSIS — Z8781 Personal history of (healed) traumatic fracture: Secondary | ICD-10-CM | POA: Diagnosis not present

## 2021-12-24 DIAGNOSIS — M329 Systemic lupus erythematosus, unspecified: Secondary | ICD-10-CM | POA: Diagnosis not present

## 2021-12-24 DIAGNOSIS — I10 Essential (primary) hypertension: Secondary | ICD-10-CM | POA: Diagnosis not present

## 2021-12-24 DIAGNOSIS — Z87891 Personal history of nicotine dependence: Secondary | ICD-10-CM | POA: Diagnosis not present

## 2021-12-24 DIAGNOSIS — M179 Osteoarthritis of knee, unspecified: Secondary | ICD-10-CM | POA: Diagnosis not present

## 2021-12-24 DIAGNOSIS — Z9013 Acquired absence of bilateral breasts and nipples: Secondary | ICD-10-CM | POA: Diagnosis not present

## 2021-12-24 DIAGNOSIS — K219 Gastro-esophageal reflux disease without esophagitis: Secondary | ICD-10-CM | POA: Diagnosis not present

## 2021-12-24 DIAGNOSIS — W010XXD Fall on same level from slipping, tripping and stumbling without subsequent striking against object, subsequent encounter: Secondary | ICD-10-CM | POA: Diagnosis not present

## 2021-12-24 DIAGNOSIS — G4700 Insomnia, unspecified: Secondary | ICD-10-CM | POA: Diagnosis not present

## 2021-12-24 DIAGNOSIS — J449 Chronic obstructive pulmonary disease, unspecified: Secondary | ICD-10-CM | POA: Diagnosis not present

## 2021-12-24 DIAGNOSIS — G8191 Hemiplegia, unspecified affecting right dominant side: Secondary | ICD-10-CM | POA: Diagnosis not present

## 2021-12-24 DIAGNOSIS — M797 Fibromyalgia: Secondary | ICD-10-CM | POA: Diagnosis not present

## 2021-12-28 DIAGNOSIS — G43909 Migraine, unspecified, not intractable, without status migrainosus: Secondary | ICD-10-CM | POA: Diagnosis not present

## 2021-12-28 DIAGNOSIS — W010XXD Fall on same level from slipping, tripping and stumbling without subsequent striking against object, subsequent encounter: Secondary | ICD-10-CM | POA: Diagnosis not present

## 2021-12-28 DIAGNOSIS — M800AXD Age-related osteoporosis with current pathological fracture, other site, subsequent encounter for fracture with routine healing: Secondary | ICD-10-CM | POA: Diagnosis not present

## 2021-12-28 DIAGNOSIS — F411 Generalized anxiety disorder: Secondary | ICD-10-CM | POA: Diagnosis not present

## 2021-12-28 DIAGNOSIS — M7060 Trochanteric bursitis, unspecified hip: Secondary | ICD-10-CM | POA: Diagnosis not present

## 2021-12-28 DIAGNOSIS — I201 Angina pectoris with documented spasm: Secondary | ICD-10-CM | POA: Diagnosis not present

## 2021-12-28 DIAGNOSIS — G8191 Hemiplegia, unspecified affecting right dominant side: Secondary | ICD-10-CM | POA: Diagnosis not present

## 2021-12-28 DIAGNOSIS — J449 Chronic obstructive pulmonary disease, unspecified: Secondary | ICD-10-CM | POA: Diagnosis not present

## 2021-12-28 DIAGNOSIS — Z9181 History of falling: Secondary | ICD-10-CM | POA: Diagnosis not present

## 2021-12-28 DIAGNOSIS — G894 Chronic pain syndrome: Secondary | ICD-10-CM | POA: Diagnosis not present

## 2021-12-28 DIAGNOSIS — G4700 Insomnia, unspecified: Secondary | ICD-10-CM | POA: Diagnosis not present

## 2021-12-28 DIAGNOSIS — M80052D Age-related osteoporosis with current pathological fracture, left femur, subsequent encounter for fracture with routine healing: Secondary | ICD-10-CM | POA: Diagnosis not present

## 2021-12-28 DIAGNOSIS — Z8673 Personal history of transient ischemic attack (TIA), and cerebral infarction without residual deficits: Secondary | ICD-10-CM | POA: Diagnosis not present

## 2021-12-28 DIAGNOSIS — M8008XD Age-related osteoporosis with current pathological fracture, vertebra(e), subsequent encounter for fracture with routine healing: Secondary | ICD-10-CM | POA: Diagnosis not present

## 2021-12-28 DIAGNOSIS — K219 Gastro-esophageal reflux disease without esophagitis: Secondary | ICD-10-CM | POA: Diagnosis not present

## 2021-12-28 DIAGNOSIS — G629 Polyneuropathy, unspecified: Secondary | ICD-10-CM | POA: Diagnosis not present

## 2021-12-28 DIAGNOSIS — N3020 Other chronic cystitis without hematuria: Secondary | ICD-10-CM | POA: Diagnosis not present

## 2021-12-28 DIAGNOSIS — F32A Depression, unspecified: Secondary | ICD-10-CM | POA: Diagnosis not present

## 2021-12-28 DIAGNOSIS — E079 Disorder of thyroid, unspecified: Secondary | ICD-10-CM | POA: Diagnosis not present

## 2021-12-28 DIAGNOSIS — M797 Fibromyalgia: Secondary | ICD-10-CM | POA: Diagnosis not present

## 2021-12-28 DIAGNOSIS — M329 Systemic lupus erythematosus, unspecified: Secondary | ICD-10-CM | POA: Diagnosis not present

## 2021-12-28 DIAGNOSIS — M7541 Impingement syndrome of right shoulder: Secondary | ICD-10-CM | POA: Diagnosis not present

## 2021-12-28 DIAGNOSIS — I10 Essential (primary) hypertension: Secondary | ICD-10-CM | POA: Diagnosis not present

## 2021-12-28 DIAGNOSIS — M179 Osteoarthritis of knee, unspecified: Secondary | ICD-10-CM | POA: Diagnosis not present

## 2021-12-29 DIAGNOSIS — M545 Low back pain, unspecified: Secondary | ICD-10-CM | POA: Diagnosis not present

## 2021-12-29 DIAGNOSIS — I201 Angina pectoris with documented spasm: Secondary | ICD-10-CM | POA: Diagnosis not present

## 2021-12-29 DIAGNOSIS — G43909 Migraine, unspecified, not intractable, without status migrainosus: Secondary | ICD-10-CM | POA: Diagnosis not present

## 2021-12-29 DIAGNOSIS — S52502D Unspecified fracture of the lower end of left radius, subsequent encounter for closed fracture with routine healing: Secondary | ICD-10-CM | POA: Diagnosis not present

## 2021-12-29 DIAGNOSIS — Z8673 Personal history of transient ischemic attack (TIA), and cerebral infarction without residual deficits: Secondary | ICD-10-CM | POA: Diagnosis not present

## 2021-12-29 DIAGNOSIS — N3020 Other chronic cystitis without hematuria: Secondary | ICD-10-CM | POA: Diagnosis not present

## 2021-12-29 DIAGNOSIS — M546 Pain in thoracic spine: Secondary | ICD-10-CM | POA: Diagnosis not present

## 2021-12-29 DIAGNOSIS — M8008XD Age-related osteoporosis with current pathological fracture, vertebra(e), subsequent encounter for fracture with routine healing: Secondary | ICD-10-CM | POA: Diagnosis not present

## 2021-12-29 DIAGNOSIS — K219 Gastro-esophageal reflux disease without esophagitis: Secondary | ICD-10-CM | POA: Diagnosis not present

## 2021-12-29 DIAGNOSIS — M797 Fibromyalgia: Secondary | ICD-10-CM | POA: Diagnosis not present

## 2021-12-29 DIAGNOSIS — G4700 Insomnia, unspecified: Secondary | ICD-10-CM | POA: Diagnosis not present

## 2021-12-29 DIAGNOSIS — M80052D Age-related osteoporosis with current pathological fracture, left femur, subsequent encounter for fracture with routine healing: Secondary | ICD-10-CM | POA: Diagnosis not present

## 2021-12-29 DIAGNOSIS — W010XXD Fall on same level from slipping, tripping and stumbling without subsequent striking against object, subsequent encounter: Secondary | ICD-10-CM | POA: Diagnosis not present

## 2021-12-29 DIAGNOSIS — J449 Chronic obstructive pulmonary disease, unspecified: Secondary | ICD-10-CM | POA: Diagnosis not present

## 2021-12-29 DIAGNOSIS — F411 Generalized anxiety disorder: Secondary | ICD-10-CM | POA: Diagnosis not present

## 2021-12-29 DIAGNOSIS — M7060 Trochanteric bursitis, unspecified hip: Secondary | ICD-10-CM | POA: Diagnosis not present

## 2021-12-29 DIAGNOSIS — I10 Essential (primary) hypertension: Secondary | ICD-10-CM | POA: Diagnosis not present

## 2021-12-29 DIAGNOSIS — G894 Chronic pain syndrome: Secondary | ICD-10-CM | POA: Diagnosis not present

## 2021-12-29 DIAGNOSIS — M800AXD Age-related osteoporosis with current pathological fracture, other site, subsequent encounter for fracture with routine healing: Secondary | ICD-10-CM | POA: Diagnosis not present

## 2021-12-29 DIAGNOSIS — G629 Polyneuropathy, unspecified: Secondary | ICD-10-CM | POA: Diagnosis not present

## 2021-12-29 DIAGNOSIS — M179 Osteoarthritis of knee, unspecified: Secondary | ICD-10-CM | POA: Diagnosis not present

## 2021-12-29 DIAGNOSIS — E079 Disorder of thyroid, unspecified: Secondary | ICD-10-CM | POA: Diagnosis not present

## 2021-12-29 DIAGNOSIS — F32A Depression, unspecified: Secondary | ICD-10-CM | POA: Diagnosis not present

## 2021-12-29 DIAGNOSIS — Z9181 History of falling: Secondary | ICD-10-CM | POA: Diagnosis not present

## 2021-12-29 DIAGNOSIS — G8191 Hemiplegia, unspecified affecting right dominant side: Secondary | ICD-10-CM | POA: Diagnosis not present

## 2021-12-29 DIAGNOSIS — M329 Systemic lupus erythematosus, unspecified: Secondary | ICD-10-CM | POA: Diagnosis not present

## 2021-12-29 DIAGNOSIS — M7541 Impingement syndrome of right shoulder: Secondary | ICD-10-CM | POA: Diagnosis not present

## 2022-01-01 DIAGNOSIS — M329 Systemic lupus erythematosus, unspecified: Secondary | ICD-10-CM | POA: Diagnosis not present

## 2022-01-01 DIAGNOSIS — Z8673 Personal history of transient ischemic attack (TIA), and cerebral infarction without residual deficits: Secondary | ICD-10-CM | POA: Diagnosis not present

## 2022-01-01 DIAGNOSIS — M80052D Age-related osteoporosis with current pathological fracture, left femur, subsequent encounter for fracture with routine healing: Secondary | ICD-10-CM | POA: Diagnosis not present

## 2022-01-01 DIAGNOSIS — M800AXD Age-related osteoporosis with current pathological fracture, other site, subsequent encounter for fracture with routine healing: Secondary | ICD-10-CM | POA: Diagnosis not present

## 2022-01-01 DIAGNOSIS — I201 Angina pectoris with documented spasm: Secondary | ICD-10-CM | POA: Diagnosis not present

## 2022-01-01 DIAGNOSIS — W010XXD Fall on same level from slipping, tripping and stumbling without subsequent striking against object, subsequent encounter: Secondary | ICD-10-CM | POA: Diagnosis not present

## 2022-01-01 DIAGNOSIS — E079 Disorder of thyroid, unspecified: Secondary | ICD-10-CM | POA: Diagnosis not present

## 2022-01-01 DIAGNOSIS — M179 Osteoarthritis of knee, unspecified: Secondary | ICD-10-CM | POA: Diagnosis not present

## 2022-01-01 DIAGNOSIS — Z9181 History of falling: Secondary | ICD-10-CM | POA: Diagnosis not present

## 2022-01-01 DIAGNOSIS — G4700 Insomnia, unspecified: Secondary | ICD-10-CM | POA: Diagnosis not present

## 2022-01-01 DIAGNOSIS — F411 Generalized anxiety disorder: Secondary | ICD-10-CM | POA: Diagnosis not present

## 2022-01-01 DIAGNOSIS — N3020 Other chronic cystitis without hematuria: Secondary | ICD-10-CM | POA: Diagnosis not present

## 2022-01-01 DIAGNOSIS — M7541 Impingement syndrome of right shoulder: Secondary | ICD-10-CM | POA: Diagnosis not present

## 2022-01-01 DIAGNOSIS — M797 Fibromyalgia: Secondary | ICD-10-CM | POA: Diagnosis not present

## 2022-01-01 DIAGNOSIS — G894 Chronic pain syndrome: Secondary | ICD-10-CM | POA: Diagnosis not present

## 2022-01-01 DIAGNOSIS — J449 Chronic obstructive pulmonary disease, unspecified: Secondary | ICD-10-CM | POA: Diagnosis not present

## 2022-01-01 DIAGNOSIS — I10 Essential (primary) hypertension: Secondary | ICD-10-CM | POA: Diagnosis not present

## 2022-01-01 DIAGNOSIS — G43909 Migraine, unspecified, not intractable, without status migrainosus: Secondary | ICD-10-CM | POA: Diagnosis not present

## 2022-01-01 DIAGNOSIS — F32A Depression, unspecified: Secondary | ICD-10-CM | POA: Diagnosis not present

## 2022-01-01 DIAGNOSIS — M8008XD Age-related osteoporosis with current pathological fracture, vertebra(e), subsequent encounter for fracture with routine healing: Secondary | ICD-10-CM | POA: Diagnosis not present

## 2022-01-01 DIAGNOSIS — G8191 Hemiplegia, unspecified affecting right dominant side: Secondary | ICD-10-CM | POA: Diagnosis not present

## 2022-01-01 DIAGNOSIS — K219 Gastro-esophageal reflux disease without esophagitis: Secondary | ICD-10-CM | POA: Diagnosis not present

## 2022-01-01 DIAGNOSIS — G629 Polyneuropathy, unspecified: Secondary | ICD-10-CM | POA: Diagnosis not present

## 2022-01-01 DIAGNOSIS — M7060 Trochanteric bursitis, unspecified hip: Secondary | ICD-10-CM | POA: Diagnosis not present

## 2022-01-06 DIAGNOSIS — U071 COVID-19: Secondary | ICD-10-CM | POA: Diagnosis not present

## 2022-01-10 DIAGNOSIS — E785 Hyperlipidemia, unspecified: Secondary | ICD-10-CM | POA: Diagnosis not present

## 2022-01-10 DIAGNOSIS — I10 Essential (primary) hypertension: Secondary | ICD-10-CM | POA: Diagnosis not present

## 2022-01-10 DIAGNOSIS — N189 Chronic kidney disease, unspecified: Secondary | ICD-10-CM | POA: Diagnosis not present

## 2022-01-11 DIAGNOSIS — M546 Pain in thoracic spine: Secondary | ICD-10-CM | POA: Diagnosis not present

## 2022-01-11 DIAGNOSIS — M545 Low back pain, unspecified: Secondary | ICD-10-CM | POA: Diagnosis not present

## 2022-01-12 DIAGNOSIS — M545 Low back pain, unspecified: Secondary | ICD-10-CM | POA: Diagnosis not present

## 2022-01-12 DIAGNOSIS — M546 Pain in thoracic spine: Secondary | ICD-10-CM | POA: Diagnosis not present

## 2022-01-15 DIAGNOSIS — M5416 Radiculopathy, lumbar region: Secondary | ICD-10-CM | POA: Diagnosis not present

## 2022-01-18 DIAGNOSIS — Z8673 Personal history of transient ischemic attack (TIA), and cerebral infarction without residual deficits: Secondary | ICD-10-CM | POA: Diagnosis not present

## 2022-01-18 DIAGNOSIS — W010XXD Fall on same level from slipping, tripping and stumbling without subsequent striking against object, subsequent encounter: Secondary | ICD-10-CM | POA: Diagnosis not present

## 2022-01-18 DIAGNOSIS — M80052D Age-related osteoporosis with current pathological fracture, left femur, subsequent encounter for fracture with routine healing: Secondary | ICD-10-CM | POA: Diagnosis not present

## 2022-01-18 DIAGNOSIS — M329 Systemic lupus erythematosus, unspecified: Secondary | ICD-10-CM | POA: Diagnosis not present

## 2022-01-18 DIAGNOSIS — F411 Generalized anxiety disorder: Secondary | ICD-10-CM | POA: Diagnosis not present

## 2022-01-18 DIAGNOSIS — M800AXD Age-related osteoporosis with current pathological fracture, other site, subsequent encounter for fracture with routine healing: Secondary | ICD-10-CM | POA: Diagnosis not present

## 2022-01-18 DIAGNOSIS — J449 Chronic obstructive pulmonary disease, unspecified: Secondary | ICD-10-CM | POA: Diagnosis not present

## 2022-01-18 DIAGNOSIS — G629 Polyneuropathy, unspecified: Secondary | ICD-10-CM | POA: Diagnosis not present

## 2022-01-18 DIAGNOSIS — M7060 Trochanteric bursitis, unspecified hip: Secondary | ICD-10-CM | POA: Diagnosis not present

## 2022-01-18 DIAGNOSIS — I10 Essential (primary) hypertension: Secondary | ICD-10-CM | POA: Diagnosis not present

## 2022-01-18 DIAGNOSIS — M797 Fibromyalgia: Secondary | ICD-10-CM | POA: Diagnosis not present

## 2022-01-18 DIAGNOSIS — K219 Gastro-esophageal reflux disease without esophagitis: Secondary | ICD-10-CM | POA: Diagnosis not present

## 2022-01-18 DIAGNOSIS — N3020 Other chronic cystitis without hematuria: Secondary | ICD-10-CM | POA: Diagnosis not present

## 2022-01-18 DIAGNOSIS — Z853 Personal history of malignant neoplasm of breast: Secondary | ICD-10-CM | POA: Diagnosis not present

## 2022-01-18 DIAGNOSIS — M7541 Impingement syndrome of right shoulder: Secondary | ICD-10-CM | POA: Diagnosis not present

## 2022-01-18 DIAGNOSIS — G894 Chronic pain syndrome: Secondary | ICD-10-CM | POA: Diagnosis not present

## 2022-01-18 DIAGNOSIS — G4700 Insomnia, unspecified: Secondary | ICD-10-CM | POA: Diagnosis not present

## 2022-01-18 DIAGNOSIS — M179 Osteoarthritis of knee, unspecified: Secondary | ICD-10-CM | POA: Diagnosis not present

## 2022-01-18 DIAGNOSIS — M8008XD Age-related osteoporosis with current pathological fracture, vertebra(e), subsequent encounter for fracture with routine healing: Secondary | ICD-10-CM | POA: Diagnosis not present

## 2022-01-18 DIAGNOSIS — G43909 Migraine, unspecified, not intractable, without status migrainosus: Secondary | ICD-10-CM | POA: Diagnosis not present

## 2022-01-18 DIAGNOSIS — I201 Angina pectoris with documented spasm: Secondary | ICD-10-CM | POA: Diagnosis not present

## 2022-01-18 DIAGNOSIS — F32A Depression, unspecified: Secondary | ICD-10-CM | POA: Diagnosis not present

## 2022-01-18 DIAGNOSIS — E079 Disorder of thyroid, unspecified: Secondary | ICD-10-CM | POA: Diagnosis not present

## 2022-01-18 DIAGNOSIS — G8191 Hemiplegia, unspecified affecting right dominant side: Secondary | ICD-10-CM | POA: Diagnosis not present

## 2022-01-20 DIAGNOSIS — Z853 Personal history of malignant neoplasm of breast: Secondary | ICD-10-CM | POA: Diagnosis not present

## 2022-01-20 DIAGNOSIS — F32A Depression, unspecified: Secondary | ICD-10-CM | POA: Diagnosis not present

## 2022-01-20 DIAGNOSIS — M797 Fibromyalgia: Secondary | ICD-10-CM | POA: Diagnosis not present

## 2022-01-20 DIAGNOSIS — M7060 Trochanteric bursitis, unspecified hip: Secondary | ICD-10-CM | POA: Diagnosis not present

## 2022-01-20 DIAGNOSIS — J449 Chronic obstructive pulmonary disease, unspecified: Secondary | ICD-10-CM | POA: Diagnosis not present

## 2022-01-20 DIAGNOSIS — M329 Systemic lupus erythematosus, unspecified: Secondary | ICD-10-CM | POA: Diagnosis not present

## 2022-01-20 DIAGNOSIS — G4700 Insomnia, unspecified: Secondary | ICD-10-CM | POA: Diagnosis not present

## 2022-01-20 DIAGNOSIS — Z8673 Personal history of transient ischemic attack (TIA), and cerebral infarction without residual deficits: Secondary | ICD-10-CM | POA: Diagnosis not present

## 2022-01-20 DIAGNOSIS — K219 Gastro-esophageal reflux disease without esophagitis: Secondary | ICD-10-CM | POA: Diagnosis not present

## 2022-01-20 DIAGNOSIS — E079 Disorder of thyroid, unspecified: Secondary | ICD-10-CM | POA: Diagnosis not present

## 2022-01-20 DIAGNOSIS — F411 Generalized anxiety disorder: Secondary | ICD-10-CM | POA: Diagnosis not present

## 2022-01-20 DIAGNOSIS — W010XXD Fall on same level from slipping, tripping and stumbling without subsequent striking against object, subsequent encounter: Secondary | ICD-10-CM | POA: Diagnosis not present

## 2022-01-20 DIAGNOSIS — G43909 Migraine, unspecified, not intractable, without status migrainosus: Secondary | ICD-10-CM | POA: Diagnosis not present

## 2022-01-20 DIAGNOSIS — M7541 Impingement syndrome of right shoulder: Secondary | ICD-10-CM | POA: Diagnosis not present

## 2022-01-20 DIAGNOSIS — I10 Essential (primary) hypertension: Secondary | ICD-10-CM | POA: Diagnosis not present

## 2022-01-20 DIAGNOSIS — G8191 Hemiplegia, unspecified affecting right dominant side: Secondary | ICD-10-CM | POA: Diagnosis not present

## 2022-01-20 DIAGNOSIS — M8008XD Age-related osteoporosis with current pathological fracture, vertebra(e), subsequent encounter for fracture with routine healing: Secondary | ICD-10-CM | POA: Diagnosis not present

## 2022-01-20 DIAGNOSIS — G629 Polyneuropathy, unspecified: Secondary | ICD-10-CM | POA: Diagnosis not present

## 2022-01-20 DIAGNOSIS — N3020 Other chronic cystitis without hematuria: Secondary | ICD-10-CM | POA: Diagnosis not present

## 2022-01-20 DIAGNOSIS — M800AXD Age-related osteoporosis with current pathological fracture, other site, subsequent encounter for fracture with routine healing: Secondary | ICD-10-CM | POA: Diagnosis not present

## 2022-01-20 DIAGNOSIS — G894 Chronic pain syndrome: Secondary | ICD-10-CM | POA: Diagnosis not present

## 2022-01-20 DIAGNOSIS — I201 Angina pectoris with documented spasm: Secondary | ICD-10-CM | POA: Diagnosis not present

## 2022-01-20 DIAGNOSIS — M80052D Age-related osteoporosis with current pathological fracture, left femur, subsequent encounter for fracture with routine healing: Secondary | ICD-10-CM | POA: Diagnosis not present

## 2022-01-20 DIAGNOSIS — M179 Osteoarthritis of knee, unspecified: Secondary | ICD-10-CM | POA: Diagnosis not present

## 2022-01-21 DIAGNOSIS — E785 Hyperlipidemia, unspecified: Secondary | ICD-10-CM | POA: Diagnosis not present

## 2022-01-22 DIAGNOSIS — G43909 Migraine, unspecified, not intractable, without status migrainosus: Secondary | ICD-10-CM | POA: Diagnosis not present

## 2022-01-22 DIAGNOSIS — K219 Gastro-esophageal reflux disease without esophagitis: Secondary | ICD-10-CM | POA: Diagnosis not present

## 2022-01-22 DIAGNOSIS — G4700 Insomnia, unspecified: Secondary | ICD-10-CM | POA: Diagnosis not present

## 2022-01-22 DIAGNOSIS — I10 Essential (primary) hypertension: Secondary | ICD-10-CM | POA: Diagnosis not present

## 2022-01-22 DIAGNOSIS — J449 Chronic obstructive pulmonary disease, unspecified: Secondary | ICD-10-CM | POA: Diagnosis not present

## 2022-01-22 DIAGNOSIS — M329 Systemic lupus erythematosus, unspecified: Secondary | ICD-10-CM | POA: Diagnosis not present

## 2022-01-22 DIAGNOSIS — G8191 Hemiplegia, unspecified affecting right dominant side: Secondary | ICD-10-CM | POA: Diagnosis not present

## 2022-01-22 DIAGNOSIS — M8008XD Age-related osteoporosis with current pathological fracture, vertebra(e), subsequent encounter for fracture with routine healing: Secondary | ICD-10-CM | POA: Diagnosis not present

## 2022-01-22 DIAGNOSIS — Z8673 Personal history of transient ischemic attack (TIA), and cerebral infarction without residual deficits: Secondary | ICD-10-CM | POA: Diagnosis not present

## 2022-01-22 DIAGNOSIS — G894 Chronic pain syndrome: Secondary | ICD-10-CM | POA: Diagnosis not present

## 2022-01-22 DIAGNOSIS — I201 Angina pectoris with documented spasm: Secondary | ICD-10-CM | POA: Diagnosis not present

## 2022-01-22 DIAGNOSIS — W010XXD Fall on same level from slipping, tripping and stumbling without subsequent striking against object, subsequent encounter: Secondary | ICD-10-CM | POA: Diagnosis not present

## 2022-01-22 DIAGNOSIS — N3020 Other chronic cystitis without hematuria: Secondary | ICD-10-CM | POA: Diagnosis not present

## 2022-01-22 DIAGNOSIS — E079 Disorder of thyroid, unspecified: Secondary | ICD-10-CM | POA: Diagnosis not present

## 2022-01-22 DIAGNOSIS — Z9181 History of falling: Secondary | ICD-10-CM | POA: Diagnosis not present

## 2022-01-22 DIAGNOSIS — M7541 Impingement syndrome of right shoulder: Secondary | ICD-10-CM | POA: Diagnosis not present

## 2022-01-22 DIAGNOSIS — G629 Polyneuropathy, unspecified: Secondary | ICD-10-CM | POA: Diagnosis not present

## 2022-01-22 DIAGNOSIS — M7060 Trochanteric bursitis, unspecified hip: Secondary | ICD-10-CM | POA: Diagnosis not present

## 2022-01-22 DIAGNOSIS — M800AXD Age-related osteoporosis with current pathological fracture, other site, subsequent encounter for fracture with routine healing: Secondary | ICD-10-CM | POA: Diagnosis not present

## 2022-01-22 DIAGNOSIS — F32A Depression, unspecified: Secondary | ICD-10-CM | POA: Diagnosis not present

## 2022-01-22 DIAGNOSIS — F411 Generalized anxiety disorder: Secondary | ICD-10-CM | POA: Diagnosis not present

## 2022-01-22 DIAGNOSIS — M797 Fibromyalgia: Secondary | ICD-10-CM | POA: Diagnosis not present

## 2022-01-22 DIAGNOSIS — M80052D Age-related osteoporosis with current pathological fracture, left femur, subsequent encounter for fracture with routine healing: Secondary | ICD-10-CM | POA: Diagnosis not present

## 2022-01-22 DIAGNOSIS — M179 Osteoarthritis of knee, unspecified: Secondary | ICD-10-CM | POA: Diagnosis not present

## 2022-01-25 DIAGNOSIS — S42292D Other displaced fracture of upper end of left humerus, subsequent encounter for fracture with routine healing: Secondary | ICD-10-CM | POA: Diagnosis not present

## 2022-01-25 DIAGNOSIS — S52522A Torus fracture of lower end of left radius, initial encounter for closed fracture: Secondary | ICD-10-CM | POA: Diagnosis not present

## 2022-01-26 DIAGNOSIS — N3020 Other chronic cystitis without hematuria: Secondary | ICD-10-CM | POA: Diagnosis not present

## 2022-01-26 DIAGNOSIS — Z9181 History of falling: Secondary | ICD-10-CM | POA: Diagnosis not present

## 2022-01-26 DIAGNOSIS — M7060 Trochanteric bursitis, unspecified hip: Secondary | ICD-10-CM | POA: Diagnosis not present

## 2022-01-26 DIAGNOSIS — W010XXD Fall on same level from slipping, tripping and stumbling without subsequent striking against object, subsequent encounter: Secondary | ICD-10-CM | POA: Diagnosis not present

## 2022-01-26 DIAGNOSIS — M179 Osteoarthritis of knee, unspecified: Secondary | ICD-10-CM | POA: Diagnosis not present

## 2022-01-26 DIAGNOSIS — I10 Essential (primary) hypertension: Secondary | ICD-10-CM | POA: Diagnosis not present

## 2022-01-26 DIAGNOSIS — Z8673 Personal history of transient ischemic attack (TIA), and cerebral infarction without residual deficits: Secondary | ICD-10-CM | POA: Diagnosis not present

## 2022-01-26 DIAGNOSIS — G4700 Insomnia, unspecified: Secondary | ICD-10-CM | POA: Diagnosis not present

## 2022-01-26 DIAGNOSIS — M797 Fibromyalgia: Secondary | ICD-10-CM | POA: Diagnosis not present

## 2022-01-26 DIAGNOSIS — G894 Chronic pain syndrome: Secondary | ICD-10-CM | POA: Diagnosis not present

## 2022-01-26 DIAGNOSIS — M80052D Age-related osteoporosis with current pathological fracture, left femur, subsequent encounter for fracture with routine healing: Secondary | ICD-10-CM | POA: Diagnosis not present

## 2022-01-26 DIAGNOSIS — G43909 Migraine, unspecified, not intractable, without status migrainosus: Secondary | ICD-10-CM | POA: Diagnosis not present

## 2022-01-26 DIAGNOSIS — F411 Generalized anxiety disorder: Secondary | ICD-10-CM | POA: Diagnosis not present

## 2022-01-26 DIAGNOSIS — M8008XD Age-related osteoporosis with current pathological fracture, vertebra(e), subsequent encounter for fracture with routine healing: Secondary | ICD-10-CM | POA: Diagnosis not present

## 2022-01-26 DIAGNOSIS — M800AXD Age-related osteoporosis with current pathological fracture, other site, subsequent encounter for fracture with routine healing: Secondary | ICD-10-CM | POA: Diagnosis not present

## 2022-01-26 DIAGNOSIS — M329 Systemic lupus erythematosus, unspecified: Secondary | ICD-10-CM | POA: Diagnosis not present

## 2022-01-26 DIAGNOSIS — M7541 Impingement syndrome of right shoulder: Secondary | ICD-10-CM | POA: Diagnosis not present

## 2022-01-26 DIAGNOSIS — F32A Depression, unspecified: Secondary | ICD-10-CM | POA: Diagnosis not present

## 2022-01-26 DIAGNOSIS — E079 Disorder of thyroid, unspecified: Secondary | ICD-10-CM | POA: Diagnosis not present

## 2022-01-26 DIAGNOSIS — K219 Gastro-esophageal reflux disease without esophagitis: Secondary | ICD-10-CM | POA: Diagnosis not present

## 2022-01-26 DIAGNOSIS — J449 Chronic obstructive pulmonary disease, unspecified: Secondary | ICD-10-CM | POA: Diagnosis not present

## 2022-01-26 DIAGNOSIS — G629 Polyneuropathy, unspecified: Secondary | ICD-10-CM | POA: Diagnosis not present

## 2022-01-26 DIAGNOSIS — G8191 Hemiplegia, unspecified affecting right dominant side: Secondary | ICD-10-CM | POA: Diagnosis not present

## 2022-01-26 DIAGNOSIS — I201 Angina pectoris with documented spasm: Secondary | ICD-10-CM | POA: Diagnosis not present

## 2022-01-27 DIAGNOSIS — M810 Age-related osteoporosis without current pathological fracture: Secondary | ICD-10-CM | POA: Diagnosis not present

## 2022-01-27 DIAGNOSIS — K219 Gastro-esophageal reflux disease without esophagitis: Secondary | ICD-10-CM | POA: Diagnosis not present

## 2022-01-27 DIAGNOSIS — F411 Generalized anxiety disorder: Secondary | ICD-10-CM | POA: Diagnosis not present

## 2022-01-27 DIAGNOSIS — J301 Allergic rhinitis due to pollen: Secondary | ICD-10-CM | POA: Diagnosis not present

## 2022-01-27 DIAGNOSIS — N189 Chronic kidney disease, unspecified: Secondary | ICD-10-CM | POA: Diagnosis not present

## 2022-01-27 DIAGNOSIS — H00011 Hordeolum externum right upper eyelid: Secondary | ICD-10-CM | POA: Diagnosis not present

## 2022-01-27 DIAGNOSIS — U099 Post covid-19 condition, unspecified: Secondary | ICD-10-CM | POA: Diagnosis not present

## 2022-01-27 DIAGNOSIS — E785 Hyperlipidemia, unspecified: Secondary | ICD-10-CM | POA: Diagnosis not present

## 2022-01-27 DIAGNOSIS — M7061 Trochanteric bursitis, right hip: Secondary | ICD-10-CM | POA: Diagnosis not present

## 2022-01-27 DIAGNOSIS — G8929 Other chronic pain: Secondary | ICD-10-CM | POA: Diagnosis not present

## 2022-01-27 DIAGNOSIS — G4709 Other insomnia: Secondary | ICD-10-CM | POA: Diagnosis not present

## 2022-01-27 DIAGNOSIS — I10 Essential (primary) hypertension: Secondary | ICD-10-CM | POA: Diagnosis not present

## 2022-01-28 DIAGNOSIS — I10 Essential (primary) hypertension: Secondary | ICD-10-CM | POA: Diagnosis not present

## 2022-01-28 DIAGNOSIS — F32A Depression, unspecified: Secondary | ICD-10-CM | POA: Diagnosis not present

## 2022-01-28 DIAGNOSIS — G43909 Migraine, unspecified, not intractable, without status migrainosus: Secondary | ICD-10-CM | POA: Diagnosis not present

## 2022-01-28 DIAGNOSIS — Z9181 History of falling: Secondary | ICD-10-CM | POA: Diagnosis not present

## 2022-01-28 DIAGNOSIS — M7541 Impingement syndrome of right shoulder: Secondary | ICD-10-CM | POA: Diagnosis not present

## 2022-01-28 DIAGNOSIS — F411 Generalized anxiety disorder: Secondary | ICD-10-CM | POA: Diagnosis not present

## 2022-01-28 DIAGNOSIS — M797 Fibromyalgia: Secondary | ICD-10-CM | POA: Diagnosis not present

## 2022-01-28 DIAGNOSIS — W010XXD Fall on same level from slipping, tripping and stumbling without subsequent striking against object, subsequent encounter: Secondary | ICD-10-CM | POA: Diagnosis not present

## 2022-01-28 DIAGNOSIS — J449 Chronic obstructive pulmonary disease, unspecified: Secondary | ICD-10-CM | POA: Diagnosis not present

## 2022-01-28 DIAGNOSIS — G8191 Hemiplegia, unspecified affecting right dominant side: Secondary | ICD-10-CM | POA: Diagnosis not present

## 2022-01-28 DIAGNOSIS — M8008XD Age-related osteoporosis with current pathological fracture, vertebra(e), subsequent encounter for fracture with routine healing: Secondary | ICD-10-CM | POA: Diagnosis not present

## 2022-01-28 DIAGNOSIS — G4700 Insomnia, unspecified: Secondary | ICD-10-CM | POA: Diagnosis not present

## 2022-01-28 DIAGNOSIS — Z8673 Personal history of transient ischemic attack (TIA), and cerebral infarction without residual deficits: Secondary | ICD-10-CM | POA: Diagnosis not present

## 2022-01-28 DIAGNOSIS — I201 Angina pectoris with documented spasm: Secondary | ICD-10-CM | POA: Diagnosis not present

## 2022-01-28 DIAGNOSIS — M329 Systemic lupus erythematosus, unspecified: Secondary | ICD-10-CM | POA: Diagnosis not present

## 2022-01-28 DIAGNOSIS — E079 Disorder of thyroid, unspecified: Secondary | ICD-10-CM | POA: Diagnosis not present

## 2022-01-28 DIAGNOSIS — K219 Gastro-esophageal reflux disease without esophagitis: Secondary | ICD-10-CM | POA: Diagnosis not present

## 2022-01-28 DIAGNOSIS — M800AXD Age-related osteoporosis with current pathological fracture, other site, subsequent encounter for fracture with routine healing: Secondary | ICD-10-CM | POA: Diagnosis not present

## 2022-01-28 DIAGNOSIS — M179 Osteoarthritis of knee, unspecified: Secondary | ICD-10-CM | POA: Diagnosis not present

## 2022-01-28 DIAGNOSIS — M7060 Trochanteric bursitis, unspecified hip: Secondary | ICD-10-CM | POA: Diagnosis not present

## 2022-01-28 DIAGNOSIS — M80052D Age-related osteoporosis with current pathological fracture, left femur, subsequent encounter for fracture with routine healing: Secondary | ICD-10-CM | POA: Diagnosis not present

## 2022-01-28 DIAGNOSIS — G629 Polyneuropathy, unspecified: Secondary | ICD-10-CM | POA: Diagnosis not present

## 2022-01-28 DIAGNOSIS — G894 Chronic pain syndrome: Secondary | ICD-10-CM | POA: Diagnosis not present

## 2022-01-28 DIAGNOSIS — N3020 Other chronic cystitis without hematuria: Secondary | ICD-10-CM | POA: Diagnosis not present

## 2022-02-01 DIAGNOSIS — F411 Generalized anxiety disorder: Secondary | ICD-10-CM | POA: Diagnosis not present

## 2022-02-01 DIAGNOSIS — M7060 Trochanteric bursitis, unspecified hip: Secondary | ICD-10-CM | POA: Diagnosis not present

## 2022-02-01 DIAGNOSIS — I10 Essential (primary) hypertension: Secondary | ICD-10-CM | POA: Diagnosis not present

## 2022-02-01 DIAGNOSIS — Z8673 Personal history of transient ischemic attack (TIA), and cerebral infarction without residual deficits: Secondary | ICD-10-CM | POA: Diagnosis not present

## 2022-02-01 DIAGNOSIS — M8008XD Age-related osteoporosis with current pathological fracture, vertebra(e), subsequent encounter for fracture with routine healing: Secondary | ICD-10-CM | POA: Diagnosis not present

## 2022-02-01 DIAGNOSIS — J449 Chronic obstructive pulmonary disease, unspecified: Secondary | ICD-10-CM | POA: Diagnosis not present

## 2022-02-01 DIAGNOSIS — M329 Systemic lupus erythematosus, unspecified: Secondary | ICD-10-CM | POA: Diagnosis not present

## 2022-02-01 DIAGNOSIS — M80052D Age-related osteoporosis with current pathological fracture, left femur, subsequent encounter for fracture with routine healing: Secondary | ICD-10-CM | POA: Diagnosis not present

## 2022-02-01 DIAGNOSIS — F32A Depression, unspecified: Secondary | ICD-10-CM | POA: Diagnosis not present

## 2022-02-01 DIAGNOSIS — E079 Disorder of thyroid, unspecified: Secondary | ICD-10-CM | POA: Diagnosis not present

## 2022-02-01 DIAGNOSIS — W010XXD Fall on same level from slipping, tripping and stumbling without subsequent striking against object, subsequent encounter: Secondary | ICD-10-CM | POA: Diagnosis not present

## 2022-02-01 DIAGNOSIS — G43909 Migraine, unspecified, not intractable, without status migrainosus: Secondary | ICD-10-CM | POA: Diagnosis not present

## 2022-02-01 DIAGNOSIS — M7541 Impingement syndrome of right shoulder: Secondary | ICD-10-CM | POA: Diagnosis not present

## 2022-02-01 DIAGNOSIS — M179 Osteoarthritis of knee, unspecified: Secondary | ICD-10-CM | POA: Diagnosis not present

## 2022-02-01 DIAGNOSIS — G8191 Hemiplegia, unspecified affecting right dominant side: Secondary | ICD-10-CM | POA: Diagnosis not present

## 2022-02-01 DIAGNOSIS — Z9181 History of falling: Secondary | ICD-10-CM | POA: Diagnosis not present

## 2022-02-01 DIAGNOSIS — N3020 Other chronic cystitis without hematuria: Secondary | ICD-10-CM | POA: Diagnosis not present

## 2022-02-01 DIAGNOSIS — M800AXD Age-related osteoporosis with current pathological fracture, other site, subsequent encounter for fracture with routine healing: Secondary | ICD-10-CM | POA: Diagnosis not present

## 2022-02-01 DIAGNOSIS — G4700 Insomnia, unspecified: Secondary | ICD-10-CM | POA: Diagnosis not present

## 2022-02-01 DIAGNOSIS — I201 Angina pectoris with documented spasm: Secondary | ICD-10-CM | POA: Diagnosis not present

## 2022-02-01 DIAGNOSIS — M797 Fibromyalgia: Secondary | ICD-10-CM | POA: Diagnosis not present

## 2022-02-01 DIAGNOSIS — K219 Gastro-esophageal reflux disease without esophagitis: Secondary | ICD-10-CM | POA: Diagnosis not present

## 2022-02-01 DIAGNOSIS — G629 Polyneuropathy, unspecified: Secondary | ICD-10-CM | POA: Diagnosis not present

## 2022-02-01 DIAGNOSIS — G894 Chronic pain syndrome: Secondary | ICD-10-CM | POA: Diagnosis not present

## 2022-02-09 DIAGNOSIS — E785 Hyperlipidemia, unspecified: Secondary | ICD-10-CM | POA: Diagnosis not present

## 2022-02-09 DIAGNOSIS — I129 Hypertensive chronic kidney disease with stage 1 through stage 4 chronic kidney disease, or unspecified chronic kidney disease: Secondary | ICD-10-CM | POA: Diagnosis not present

## 2022-02-09 DIAGNOSIS — N189 Chronic kidney disease, unspecified: Secondary | ICD-10-CM | POA: Diagnosis not present

## 2022-02-10 DIAGNOSIS — I129 Hypertensive chronic kidney disease with stage 1 through stage 4 chronic kidney disease, or unspecified chronic kidney disease: Secondary | ICD-10-CM | POA: Diagnosis not present

## 2022-02-18 DIAGNOSIS — N189 Chronic kidney disease, unspecified: Secondary | ICD-10-CM | POA: Diagnosis not present

## 2022-02-26 DIAGNOSIS — S42292D Other displaced fracture of upper end of left humerus, subsequent encounter for fracture with routine healing: Secondary | ICD-10-CM | POA: Diagnosis not present

## 2022-03-03 DIAGNOSIS — M810 Age-related osteoporosis without current pathological fracture: Secondary | ICD-10-CM | POA: Diagnosis not present

## 2022-03-03 DIAGNOSIS — G8929 Other chronic pain: Secondary | ICD-10-CM | POA: Diagnosis not present

## 2022-03-03 DIAGNOSIS — E785 Hyperlipidemia, unspecified: Secondary | ICD-10-CM | POA: Diagnosis not present

## 2022-03-03 DIAGNOSIS — I739 Peripheral vascular disease, unspecified: Secondary | ICD-10-CM | POA: Diagnosis not present

## 2022-03-03 DIAGNOSIS — M7061 Trochanteric bursitis, right hip: Secondary | ICD-10-CM | POA: Diagnosis not present

## 2022-03-03 DIAGNOSIS — K219 Gastro-esophageal reflux disease without esophagitis: Secondary | ICD-10-CM | POA: Diagnosis not present

## 2022-03-03 DIAGNOSIS — N189 Chronic kidney disease, unspecified: Secondary | ICD-10-CM | POA: Diagnosis not present

## 2022-03-03 DIAGNOSIS — G4709 Other insomnia: Secondary | ICD-10-CM | POA: Diagnosis not present

## 2022-03-03 DIAGNOSIS — F411 Generalized anxiety disorder: Secondary | ICD-10-CM | POA: Diagnosis not present

## 2022-03-03 DIAGNOSIS — J301 Allergic rhinitis due to pollen: Secondary | ICD-10-CM | POA: Diagnosis not present

## 2022-03-03 DIAGNOSIS — I10 Essential (primary) hypertension: Secondary | ICD-10-CM | POA: Diagnosis not present

## 2022-03-05 DIAGNOSIS — F411 Generalized anxiety disorder: Secondary | ICD-10-CM | POA: Diagnosis not present

## 2022-03-05 DIAGNOSIS — M810 Age-related osteoporosis without current pathological fracture: Secondary | ICD-10-CM | POA: Diagnosis not present

## 2022-03-05 DIAGNOSIS — I10 Essential (primary) hypertension: Secondary | ICD-10-CM | POA: Diagnosis not present

## 2022-03-05 DIAGNOSIS — G4709 Other insomnia: Secondary | ICD-10-CM | POA: Diagnosis not present

## 2022-03-05 DIAGNOSIS — E785 Hyperlipidemia, unspecified: Secondary | ICD-10-CM | POA: Diagnosis not present

## 2022-03-05 DIAGNOSIS — G8929 Other chronic pain: Secondary | ICD-10-CM | POA: Diagnosis not present

## 2022-03-05 DIAGNOSIS — N189 Chronic kidney disease, unspecified: Secondary | ICD-10-CM | POA: Diagnosis not present

## 2022-03-05 DIAGNOSIS — K219 Gastro-esophageal reflux disease without esophagitis: Secondary | ICD-10-CM | POA: Diagnosis not present

## 2022-03-05 DIAGNOSIS — I739 Peripheral vascular disease, unspecified: Secondary | ICD-10-CM | POA: Diagnosis not present

## 2022-03-05 DIAGNOSIS — J301 Allergic rhinitis due to pollen: Secondary | ICD-10-CM | POA: Diagnosis not present

## 2022-03-05 DIAGNOSIS — M7061 Trochanteric bursitis, right hip: Secondary | ICD-10-CM | POA: Diagnosis not present

## 2022-03-05 DIAGNOSIS — E559 Vitamin D deficiency, unspecified: Secondary | ICD-10-CM | POA: Diagnosis not present

## 2022-03-08 DIAGNOSIS — Z9889 Other specified postprocedural states: Secondary | ICD-10-CM | POA: Diagnosis not present

## 2022-03-08 DIAGNOSIS — I10 Essential (primary) hypertension: Secondary | ICD-10-CM | POA: Diagnosis not present

## 2022-03-08 DIAGNOSIS — R0602 Shortness of breath: Secondary | ICD-10-CM | POA: Diagnosis not present

## 2022-03-08 DIAGNOSIS — R600 Localized edema: Secondary | ICD-10-CM | POA: Diagnosis not present

## 2022-03-08 DIAGNOSIS — R002 Palpitations: Secondary | ICD-10-CM | POA: Diagnosis not present

## 2022-03-12 DIAGNOSIS — E785 Hyperlipidemia, unspecified: Secondary | ICD-10-CM | POA: Diagnosis not present

## 2022-03-12 DIAGNOSIS — I129 Hypertensive chronic kidney disease with stage 1 through stage 4 chronic kidney disease, or unspecified chronic kidney disease: Secondary | ICD-10-CM | POA: Diagnosis not present

## 2022-03-12 DIAGNOSIS — N189 Chronic kidney disease, unspecified: Secondary | ICD-10-CM | POA: Diagnosis not present

## 2022-03-19 DIAGNOSIS — M5416 Radiculopathy, lumbar region: Secondary | ICD-10-CM | POA: Diagnosis not present

## 2022-03-24 DIAGNOSIS — R0602 Shortness of breath: Secondary | ICD-10-CM | POA: Diagnosis not present

## 2022-03-24 DIAGNOSIS — R600 Localized edema: Secondary | ICD-10-CM | POA: Diagnosis not present

## 2022-03-25 DIAGNOSIS — M47817 Spondylosis without myelopathy or radiculopathy, lumbosacral region: Secondary | ICD-10-CM | POA: Diagnosis not present

## 2022-03-31 DIAGNOSIS — Z9889 Other specified postprocedural states: Secondary | ICD-10-CM | POA: Diagnosis not present

## 2022-03-31 DIAGNOSIS — R0602 Shortness of breath: Secondary | ICD-10-CM | POA: Diagnosis not present

## 2022-03-31 DIAGNOSIS — I872 Venous insufficiency (chronic) (peripheral): Secondary | ICD-10-CM | POA: Diagnosis not present

## 2022-03-31 DIAGNOSIS — R600 Localized edema: Secondary | ICD-10-CM | POA: Diagnosis not present

## 2022-03-31 DIAGNOSIS — R002 Palpitations: Secondary | ICD-10-CM | POA: Diagnosis not present

## 2022-03-31 DIAGNOSIS — I10 Essential (primary) hypertension: Secondary | ICD-10-CM | POA: Diagnosis not present

## 2022-04-03 ENCOUNTER — Emergency Department: Payer: PPO

## 2022-04-03 ENCOUNTER — Encounter: Payer: Self-pay | Admitting: Emergency Medicine

## 2022-04-03 ENCOUNTER — Emergency Department
Admission: EM | Admit: 2022-04-03 | Discharge: 2022-04-03 | Disposition: A | Discharge status: Home / Self Care | Payer: PPO | Attending: Emergency Medicine | Admitting: Emergency Medicine

## 2022-04-03 ENCOUNTER — Other Ambulatory Visit: Payer: Self-pay

## 2022-04-03 DIAGNOSIS — R609 Edema, unspecified: Secondary | ICD-10-CM | POA: Diagnosis not present

## 2022-04-03 DIAGNOSIS — Y92002 Bathroom of unspecified non-institutional (private) residence single-family (private) house as the place of occurrence of the external cause: Secondary | ICD-10-CM | POA: Insufficient documentation

## 2022-04-03 DIAGNOSIS — M25561 Pain in right knee: Secondary | ICD-10-CM | POA: Diagnosis not present

## 2022-04-03 DIAGNOSIS — W1811XA Fall from or off toilet without subsequent striking against object, initial encounter: Secondary | ICD-10-CM | POA: Insufficient documentation

## 2022-04-03 DIAGNOSIS — M25562 Pain in left knee: Secondary | ICD-10-CM | POA: Insufficient documentation

## 2022-04-03 DIAGNOSIS — I10 Essential (primary) hypertension: Secondary | ICD-10-CM | POA: Diagnosis not present

## 2022-04-03 DIAGNOSIS — M25461 Effusion, right knee: Secondary | ICD-10-CM | POA: Diagnosis not present

## 2022-04-03 DIAGNOSIS — Z853 Personal history of malignant neoplasm of breast: Secondary | ICD-10-CM | POA: Diagnosis not present

## 2022-04-03 DIAGNOSIS — W19XXXA Unspecified fall, initial encounter: Secondary | ICD-10-CM | POA: Diagnosis not present

## 2022-04-03 MED ORDER — OXYCODONE-ACETAMINOPHEN 5-325 MG PO TABS: 1.0000 | ORAL_TABLET | Freq: Four times a day (QID) | ORAL | 0 refills | Status: DC | PRN

## 2022-04-03 MED ORDER — HYDROCODONE-ACETAMINOPHEN 5-325 MG PO TABS
1.0000 | ORAL_TABLET | Freq: Once | ORAL | Status: AC
  Administered 2022-04-03: 1 via ORAL
  Filled 2022-04-03: qty 1

## 2022-04-03 NOTE — ED Provider Notes (Signed)
? ?[facility] ?Provider Note ? ? ? Event Date/Time  ? First MD Initiated Contact with Patient 04/03/22 1032   ?  (approximate) ? ? ?History  ? ?Chief Complaint ?Fall ? ? ?HPI ?Donna Evans is a 76 y.o. female, history of obese, migraines, GAD, fibromyalgia, GERD, breast cancer, presents to the emergency department for evaluation of knee pain.  Patient states that she was sitting on the toilet when she tried to get up but her feet slipped out from under her and her knees fell forward onto the ground.  She states that since then, she has been unable to walk.  Denies any head injury, LOC, or preceding symptoms.  Denies fever/chills, chest pain, shortness of breath, abdominal pain, flank pain, nausea/vomiting, diarrhea, urinary symptoms, or numbness/tingling in upper or lower extremities. ? ?History Limitations: No limitations. ? ?    ? ? ?Physical Exam  ?Triage Vital Signs: ?ED Triage Vitals  ?Enc Vitals Group  ?   BP 04/03/22 1041 (!) 150/75  ?   Pulse Rate 04/03/22 1041 77  ?   Resp 04/03/22 1041 20  ?   Temp 04/03/22 1041 97.7 ?F (36.5 ?C)  ?   Temp Source 04/03/22 1041 Oral  ?   SpO2 04/03/22 1041 100 %  ?   Weight 04/03/22 1029 153 lb (69.4 kg)  ?   Height 04/03/22 1029 '5\' 6"'$  (1.676 m)  ?   Head Circumference --   ?   Peak Flow --   ?   Pain Score 04/03/22 1029 10  ?   Pain Loc --   ?   Pain Edu? --   ?   Excl. in [place]? --   ? ? ?Most recent vital signs: ?Vitals:  ? 04/03/22 1041 04/03/22 1320  ?BP: (!) 150/75 (!) 158/77  ?Pulse: 77 82  ?Resp: 20 18  ?Temp: 97.7 ?F (36.5 ?C)   ?SpO2: 100% 100%  ? ? ?General: Awake, NAD.  ?Skin: Warm, dry. No rashes or lesions.  ?Eyes: PERRL. Conjunctivae normal.  ?CV: Good peripheral perfusion.  ?Resp: Normal effort.  ?Abd: Soft, non-tender. No distention.  ?Neuro: At baseline. No gross neurological deficits.  ? ?Focused Exam: No gross deformities to either knee.  Patient maintains normal range of motion, including flexion and extension, though does endorse  some pain with flexion.  Pain elicited with valgus/varus maneuvering, particular along the medial aspect of the knees bilaterally.  Negative anterior drawer.  Pulse, motor, sensation intact distally bilaterally.  Patient still able to stand up and ambulate, with assistance.  ? ?Physical Exam ? ? ? ?ED Results / Procedures / Treatments  ?Labs ?(all labs ordered are listed, but only abnormal results are displayed) ?Labs Reviewed - No data to display ? ? ?EKG ?Not applicable. ? ? ?RADIOLOGY ? ?ED Provider Interpretation: I personally reviewed these x-rays, no evidence of acute fracture or dislocation on either knee x-ray based on my interpretation. ? ?DG Knee Complete 4 Views Left ? ?Result Date: 04/03/2022 ?CLINICAL DATA:  Mechanical fall.  Bilateral medial knee pain. EXAM: LEFT KNEE - COMPLETE 4+ VIEW COMPARISON:  None. FINDINGS: There is diffuse decreased bone mineralization. Mild medial compartment joint space narrowing. No definite joint effusion. No acute fracture is seen. No dislocation. IMPRESSION: No acute fracture. Electronically Signed   By: Yvonne Kendall M.D.   On: 04/03/2022 11:22  ? ?DG Knee Complete 4 Views Right ? ?Result Date: 04/03/2022 ?CLINICAL DATA:  Fall.  Bilateral medial knee pain. EXAM:  RIGHT KNEE - COMPLETE 4+ VIEW COMPARISON:  Right knee radiographs 10/01/2014 FINDINGS: Mildly decreased bone mineralization. Minimal medial compartment joint space narrowing. Small joint effusion new from prior. No acute fracture is seen. No dislocation. IMPRESSION: No acute fracture. Electronically Signed   By: Yvonne Kendall M.D.   On: 04/03/2022 11:19   ? ?PROCEDURES: ? ?Critical Care performed: None. ? ?Procedures ? ? ? ?MEDICATIONS ORDERED IN ED: ?Medications  ?HYDROcodone-acetaminophen (NORCO/VICODIN) 5-325 MG per tablet 1 tablet (1 tablet Oral Given 04/03/22 1110)  ? ? ? ?IMPRESSION / MDM / ASSESSMENT AND PLAN / ED COURSE  ?I reviewed the triage vital signs and the nursing notes. ?             ?                ? ?Differential diagnosis includes, but is not limited to, knee sprain, patella fracture, knee dislocation, ACL/PCL tear, toe meniscus, MCL/LCL tear. ? ?ED Course ?Patient appears well, vitals within normal limits.  NAD.  We will go ahead and treat pain with hydrocodone/acetaminophen. ? ?Assessment/Plan ?Presentation consistent with knee sprain versus MCL strain/tear.  The joint appears stable.  X-ray reassuring for no evidence of fractures or dislocations.  Patient states that her pain and range of motion has been improving over the past few hours.  She is still able to ambulate, though will likely require walker for stability.  Provide her with knee braces bilaterally.  Advised her to follow-up with orthopedics.  Encouraged her to use caution when ambulating throughout her home and to not walk without a walker until she recovers.  We will provide her with a short-term prescription for oxycodone/acetaminophen to be used as needed.  We will plan to discharge. ? ?Provided the patient with anticipatory guidance, return precautions, and educational material. Encouraged the patient to return to the emergency department at any time if they begin to experience any new or worsening symptoms. Patient expressed understanding and agreed with the plan.  ? ?  ? ? ?FINAL CLINICAL IMPRESSION(S) / ED DIAGNOSES  ? ?Final diagnoses:  ?Fall, initial encounter  ?Acute pain of both knees  ? ? ? ?Rx / DC Orders  ? ?ED Discharge Orders   ? ?      Ordered  ?  oxyCODONE-acetaminophen (PERCOCET/ROXICET) 5-325 MG tablet  Every 6 hours PRN,   Status:  Discontinued       ? 04/03/22 1258  ?  oxyCODONE-acetaminophen (PERCOCET/ROXICET) 5-325 MG tablet  Every 6 hours PRN,   Status:  Discontinued       ? 04/03/22 1309  ?  oxyCODONE-acetaminophen (PERCOCET/ROXICET) 5-325 MG tablet  Every 6 hours PRN       ? 04/03/22 1320  ? ?  ?  ? ?  ? ? ? ?Note:  This document was prepared using Dragon voice recognition software and may include unintentional  dictation errors. ?  ?Teodoro Spray, [place] ?04/03/22 1619 ? ?  ?Rada Hay, MD ?04/04/22 1103 ? ?

## 2022-04-03 NOTE — Discharge Instructions (Addendum)
-  Take Tylenol/ibuprofen as needed.  Utilize oxycodone sparingly ?-Follow-up with the orthopedist listed above, as discussed. ?-Utilize your walker frequently when getting around the house.  Be sure to keep the braces on while walking. ?-Return to the emergency department anytime if you begin to experience any new or worsening symptoms. ?

## 2022-04-03 NOTE — ED Triage Notes (Signed)
Pt via EMS from home. Pt had a mechanical fall around 0300 this AM. States that she was on the toilet and fell onto her knee. Denies head injury. Pt went back to sleep. Woke up this AM with bilateral knee pain. Pt state "every time I move I scream" Pt is A&Ox4 and NAD.  ?

## 2022-04-11 DIAGNOSIS — N189 Chronic kidney disease, unspecified: Secondary | ICD-10-CM | POA: Diagnosis not present

## 2022-04-11 DIAGNOSIS — I129 Hypertensive chronic kidney disease with stage 1 through stage 4 chronic kidney disease, or unspecified chronic kidney disease: Secondary | ICD-10-CM | POA: Diagnosis not present

## 2022-04-11 DIAGNOSIS — E785 Hyperlipidemia, unspecified: Secondary | ICD-10-CM | POA: Diagnosis not present

## 2022-04-21 DIAGNOSIS — E538 Deficiency of other specified B group vitamins: Secondary | ICD-10-CM | POA: Diagnosis not present

## 2022-04-21 DIAGNOSIS — M810 Age-related osteoporosis without current pathological fracture: Secondary | ICD-10-CM | POA: Diagnosis not present

## 2022-04-21 DIAGNOSIS — E559 Vitamin D deficiency, unspecified: Secondary | ICD-10-CM | POA: Diagnosis not present

## 2022-04-21 DIAGNOSIS — R946 Abnormal results of thyroid function studies: Secondary | ICD-10-CM | POA: Diagnosis not present

## 2022-04-28 ENCOUNTER — Inpatient Hospital Stay
Admission: EM | Admit: 2022-04-28 | Discharge: 2022-05-04 | DRG: 682 | Disposition: A | Discharge status: Skilled Nursing Facility | Payer: PPO | Attending: Internal Medicine | Admitting: Internal Medicine

## 2022-04-28 ENCOUNTER — Emergency Department: Payer: PPO

## 2022-04-28 ENCOUNTER — Encounter: Payer: Self-pay | Admitting: Intensive Care

## 2022-04-28 ENCOUNTER — Inpatient Hospital Stay: Payer: PPO

## 2022-04-28 ENCOUNTER — Other Ambulatory Visit: Payer: Self-pay

## 2022-04-28 DIAGNOSIS — Z961 Presence of intraocular lens: Secondary | ICD-10-CM | POA: Diagnosis present

## 2022-04-28 DIAGNOSIS — Z9013 Acquired absence of bilateral breasts and nipples: Secondary | ICD-10-CM

## 2022-04-28 DIAGNOSIS — R4182 Altered mental status, unspecified: Secondary | ICD-10-CM | POA: Diagnosis not present

## 2022-04-28 DIAGNOSIS — I639 Cerebral infarction, unspecified: Secondary | ICD-10-CM | POA: Diagnosis not present

## 2022-04-28 DIAGNOSIS — F32A Depression, unspecified: Secondary | ICD-10-CM | POA: Diagnosis not present

## 2022-04-28 DIAGNOSIS — R531 Weakness: Secondary | ICD-10-CM | POA: Diagnosis not present

## 2022-04-28 DIAGNOSIS — Z8673 Personal history of transient ischemic attack (TIA), and cerebral infarction without residual deficits: Secondary | ICD-10-CM

## 2022-04-28 DIAGNOSIS — N390 Urinary tract infection, site not specified: Secondary | ICD-10-CM | POA: Diagnosis not present

## 2022-04-28 DIAGNOSIS — W19XXXA Unspecified fall, initial encounter: Secondary | ICD-10-CM | POA: Diagnosis present

## 2022-04-28 DIAGNOSIS — R270 Ataxia, unspecified: Secondary | ICD-10-CM | POA: Diagnosis not present

## 2022-04-28 DIAGNOSIS — I248 Other forms of acute ischemic heart disease: Secondary | ICD-10-CM | POA: Diagnosis not present

## 2022-04-28 DIAGNOSIS — R7989 Other specified abnormal findings of blood chemistry: Secondary | ICD-10-CM | POA: Diagnosis not present

## 2022-04-28 DIAGNOSIS — B952 Enterococcus as the cause of diseases classified elsewhere: Secondary | ICD-10-CM | POA: Diagnosis not present

## 2022-04-28 DIAGNOSIS — K219 Gastro-esophageal reflux disease without esophagitis: Secondary | ICD-10-CM | POA: Diagnosis not present

## 2022-04-28 DIAGNOSIS — Z9841 Cataract extraction status, right eye: Secondary | ICD-10-CM

## 2022-04-28 DIAGNOSIS — R278 Other lack of coordination: Secondary | ICD-10-CM | POA: Diagnosis not present

## 2022-04-28 DIAGNOSIS — R2681 Unsteadiness on feet: Secondary | ICD-10-CM | POA: Diagnosis not present

## 2022-04-28 DIAGNOSIS — M329 Systemic lupus erythematosus, unspecified: Secondary | ICD-10-CM | POA: Diagnosis not present

## 2022-04-28 DIAGNOSIS — Z66 Do not resuscitate: Secondary | ICD-10-CM | POA: Diagnosis present

## 2022-04-28 DIAGNOSIS — N179 Acute kidney failure, unspecified: Secondary | ICD-10-CM | POA: Diagnosis not present

## 2022-04-28 DIAGNOSIS — I5032 Chronic diastolic (congestive) heart failure: Secondary | ICD-10-CM | POA: Diagnosis not present

## 2022-04-28 DIAGNOSIS — I130 Hypertensive heart and chronic kidney disease with heart failure and stage 1 through stage 4 chronic kidney disease, or unspecified chronic kidney disease: Secondary | ICD-10-CM | POA: Diagnosis present

## 2022-04-28 DIAGNOSIS — F411 Generalized anxiety disorder: Secondary | ICD-10-CM | POA: Diagnosis not present

## 2022-04-28 DIAGNOSIS — R55 Syncope and collapse: Secondary | ICD-10-CM | POA: Diagnosis not present

## 2022-04-28 DIAGNOSIS — J329 Chronic sinusitis, unspecified: Secondary | ICD-10-CM | POA: Diagnosis not present

## 2022-04-28 DIAGNOSIS — Z91048 Other nonmedicinal substance allergy status: Secondary | ICD-10-CM

## 2022-04-28 DIAGNOSIS — G894 Chronic pain syndrome: Secondary | ICD-10-CM | POA: Diagnosis present

## 2022-04-28 DIAGNOSIS — Z887 Allergy status to serum and vaccine status: Secondary | ICD-10-CM

## 2022-04-28 DIAGNOSIS — G319 Degenerative disease of nervous system, unspecified: Secondary | ICD-10-CM | POA: Diagnosis not present

## 2022-04-28 DIAGNOSIS — M6281 Muscle weakness (generalized): Secondary | ICD-10-CM | POA: Diagnosis not present

## 2022-04-28 DIAGNOSIS — R410 Disorientation, unspecified: Secondary | ICD-10-CM | POA: Diagnosis not present

## 2022-04-28 DIAGNOSIS — Z882 Allergy status to sulfonamides status: Secondary | ICD-10-CM

## 2022-04-28 DIAGNOSIS — Z9842 Cataract extraction status, left eye: Secondary | ICD-10-CM

## 2022-04-28 DIAGNOSIS — Z9049 Acquired absence of other specified parts of digestive tract: Secondary | ICD-10-CM

## 2022-04-28 DIAGNOSIS — R778 Other specified abnormalities of plasma proteins: Secondary | ICD-10-CM | POA: Diagnosis not present

## 2022-04-28 DIAGNOSIS — Z79899 Other long term (current) drug therapy: Secondary | ICD-10-CM

## 2022-04-28 DIAGNOSIS — G43909 Migraine, unspecified, not intractable, without status migrainosus: Secondary | ICD-10-CM | POA: Diagnosis not present

## 2022-04-28 DIAGNOSIS — E860 Dehydration: Secondary | ICD-10-CM | POA: Diagnosis present

## 2022-04-28 DIAGNOSIS — I10 Essential (primary) hypertension: Secondary | ICD-10-CM | POA: Diagnosis present

## 2022-04-28 DIAGNOSIS — G9341 Metabolic encephalopathy: Secondary | ICD-10-CM | POA: Diagnosis present

## 2022-04-28 DIAGNOSIS — Z885 Allergy status to narcotic agent status: Secondary | ICD-10-CM

## 2022-04-28 DIAGNOSIS — Z853 Personal history of malignant neoplasm of breast: Secondary | ICD-10-CM

## 2022-04-28 DIAGNOSIS — J449 Chronic obstructive pulmonary disease, unspecified: Secondary | ICD-10-CM | POA: Diagnosis not present

## 2022-04-28 DIAGNOSIS — Z87442 Personal history of urinary calculi: Secondary | ICD-10-CM

## 2022-04-28 DIAGNOSIS — R4781 Slurred speech: Secondary | ICD-10-CM | POA: Diagnosis present

## 2022-04-28 DIAGNOSIS — R296 Repeated falls: Secondary | ICD-10-CM | POA: Diagnosis not present

## 2022-04-28 DIAGNOSIS — M797 Fibromyalgia: Secondary | ICD-10-CM | POA: Diagnosis not present

## 2022-04-28 DIAGNOSIS — Z803 Family history of malignant neoplasm of breast: Secondary | ICD-10-CM

## 2022-04-28 DIAGNOSIS — N1831 Chronic kidney disease, stage 3a: Secondary | ICD-10-CM | POA: Diagnosis not present

## 2022-04-28 DIAGNOSIS — I959 Hypotension, unspecified: Secondary | ICD-10-CM | POA: Diagnosis present

## 2022-04-28 DIAGNOSIS — H814 Vertigo of central origin: Secondary | ICD-10-CM | POA: Diagnosis not present

## 2022-04-28 DIAGNOSIS — I6782 Cerebral ischemia: Secondary | ICD-10-CM | POA: Diagnosis not present

## 2022-04-28 DIAGNOSIS — S0990XA Unspecified injury of head, initial encounter: Secondary | ICD-10-CM | POA: Diagnosis not present

## 2022-04-28 DIAGNOSIS — K589 Irritable bowel syndrome without diarrhea: Secondary | ICD-10-CM | POA: Diagnosis present

## 2022-04-28 DIAGNOSIS — Z87891 Personal history of nicotine dependence: Secondary | ICD-10-CM

## 2022-04-28 DIAGNOSIS — Z880 Allergy status to penicillin: Secondary | ICD-10-CM

## 2022-04-28 DIAGNOSIS — N3000 Acute cystitis without hematuria: Secondary | ICD-10-CM | POA: Diagnosis not present

## 2022-04-28 DIAGNOSIS — Z91040 Latex allergy status: Secondary | ICD-10-CM

## 2022-04-28 DIAGNOSIS — Z7401 Bed confinement status: Secondary | ICD-10-CM | POA: Diagnosis not present

## 2022-04-28 LAB — CBC WITH DIFFERENTIAL/PLATELET
Abs Immature Granulocytes: 0.06 10*3/uL (ref 0.00–0.07)
Basophils Absolute: 0.1 10*3/uL (ref 0.0–0.1)
Basophils Relative: 1 %
Eosinophils Absolute: 0 10*3/uL (ref 0.0–0.5)
Eosinophils Relative: 0 %
HCT: 45.9 % (ref 36.0–46.0)
Hemoglobin: 14.9 g/dL (ref 12.0–15.0)
Immature Granulocytes: 1 %
Lymphocytes Relative: 17 %
Lymphs Abs: 1.8 10*3/uL (ref 0.7–4.0)
MCH: 31.5 pg (ref 26.0–34.0)
MCHC: 32.5 g/dL (ref 30.0–36.0)
MCV: 97 fL (ref 80.0–100.0)
Monocytes Absolute: 0.8 10*3/uL (ref 0.1–1.0)
Monocytes Relative: 8 %
Neutro Abs: 7.6 10*3/uL (ref 1.7–7.7)
Neutrophils Relative %: 73 %
Platelets: 336 10*3/uL (ref 150–400)
RBC: 4.73 MIL/uL (ref 3.87–5.11)
RDW: 14 % (ref 11.5–15.5)
WBC: 10.4 10*3/uL (ref 4.0–10.5)
nRBC: 0 % (ref 0.0–0.2)

## 2022-04-28 LAB — APTT: aPTT: 34 seconds (ref 24–36)

## 2022-04-28 LAB — TROPONIN I (HIGH SENSITIVITY)
Troponin I (High Sensitivity): 362 ng/L (ref <18)
Troponin I (High Sensitivity): 287 ng/L (ref <18)
Troponin I (High Sensitivity): 191 ng/L (ref <18)

## 2022-04-28 LAB — COMPREHENSIVE METABOLIC PANEL
ALT: 22 U/L (ref 0–44)
AST: 41 U/L (ref 15–41)
Albumin: 3.9 g/dL (ref 3.5–5.0)
Alkaline Phosphatase: 470 U/L — ABNORMAL HIGH (ref 38–126)
Anion gap: 15 (ref 5–15)
BUN: 76 mg/dL — ABNORMAL HIGH (ref 8–23)
CO2: 17 mmol/L — ABNORMAL LOW (ref 22–32)
Calcium: 9.1 mg/dL (ref 8.9–10.3)
Chloride: 106 mmol/L (ref 98–111)
Creatinine, Ser: 2.89 mg/dL — ABNORMAL HIGH (ref 0.44–1.00)
GFR, Estimated: 16 mL/min — ABNORMAL LOW (ref >60)
Glucose, Bld: 141 mg/dL — ABNORMAL HIGH (ref 70–99)
Potassium: 4.5 mmol/L (ref 3.5–5.1)
Sodium: 138 mmol/L (ref 135–145)
Total Bilirubin: 0.4 mg/dL (ref 0.3–1.2)
Total Protein: 7.8 g/dL (ref 6.5–8.1)

## 2022-04-28 LAB — CK: Total CK: 46 U/L (ref 38–234)

## 2022-04-28 LAB — BRAIN NATRIURETIC PEPTIDE: B Natriuretic Peptide: 538.8 pg/mL — ABNORMAL HIGH (ref 0.0–100.0)

## 2022-04-28 LAB — D-DIMER, QUANTITATIVE: D-Dimer, Quant: 1.12 ug/mL-FEU — ABNORMAL HIGH (ref 0.00–0.50)

## 2022-04-28 LAB — PROTIME-INR
INR: 1 (ref 0.8–1.2)
Prothrombin Time: 13.2 seconds (ref 11.4–15.2)

## 2022-04-28 LAB — LACTIC ACID, PLASMA: Lactic Acid, Venous: 1.8 mmol/L (ref 0.5–1.9)

## 2022-04-28 MED ORDER — OXYCODONE-ACETAMINOPHEN 5-325 MG PO TABS
1.0000 | ORAL_TABLET | Freq: Four times a day (QID) | ORAL | Status: DC | PRN
  Administered 2022-04-29 – 2022-05-04 (×14): 1 via ORAL
  Filled 2022-04-28 (×15): qty 1

## 2022-04-28 MED ORDER — ALUM & MAG HYDROXIDE-SIMETH 200-200-20 MG/5ML PO SUSP: 15.0000 mL | Freq: Four times a day (QID) | ORAL | Status: DC | PRN

## 2022-04-28 MED ORDER — ASPIRIN 81 MG PO TBEC
81.0000 mg | DELAYED_RELEASE_TABLET | Freq: Every day | ORAL | Status: DC
  Administered 2022-04-29 – 2022-05-04 (×6): 81 mg via ORAL
  Filled 2022-04-28 (×7): qty 1

## 2022-04-28 MED ORDER — ASPIRIN 81 MG PO CHEW
324.0000 mg | CHEWABLE_TABLET | Freq: Once | ORAL | Status: AC
  Administered 2022-04-28: 324 mg via ORAL
  Filled 2022-04-28: qty 4

## 2022-04-28 MED ORDER — ONDANSETRON HCL 4 MG/2ML IJ SOLN: 4.0000 mg | Freq: Three times a day (TID) | INTRAMUSCULAR | Status: DC | PRN

## 2022-04-28 MED ORDER — SODIUM CHLORIDE 0.9 % IV BOLUS
1000.0000 mL | Freq: Once | INTRAVENOUS | Status: AC
Start: 2022-04-28 — End: 2022-04-28
  Administered 2022-04-28: 1000 mL via INTRAVENOUS

## 2022-04-28 MED ORDER — ACETAMINOPHEN 325 MG PO TABS
650.0000 mg | ORAL_TABLET | Freq: Four times a day (QID) | ORAL | Status: DC | PRN
  Administered 2022-04-28: 650 mg via ORAL
  Filled 2022-04-28: qty 2

## 2022-04-28 MED ORDER — ROSUVASTATIN CALCIUM 10 MG PO TABS
10.0000 mg | ORAL_TABLET | Freq: Every day | ORAL | Status: DC
  Administered 2022-04-29 – 2022-05-04 (×6): 10 mg via ORAL
  Filled 2022-04-28 (×6): qty 1

## 2022-04-28 MED ORDER — CYCLOBENZAPRINE HCL 10 MG PO TABS
10.0000 mg | ORAL_TABLET | Freq: Three times a day (TID) | ORAL | Status: DC | PRN
  Administered 2022-04-28 – 2022-05-04 (×3): 10 mg via ORAL
  Filled 2022-04-28 (×3): qty 1

## 2022-04-28 MED ORDER — HEPARIN (PORCINE) 25000 UT/250ML-% IV SOLN
600.0000 [IU]/h | INTRAVENOUS | Status: DC
  Administered 2022-04-28: 700 [IU]/h via INTRAVENOUS
  Filled 2022-04-28: qty 250

## 2022-04-28 MED ORDER — ZOLPIDEM TARTRATE 5 MG PO TABS
5.0000 mg | ORAL_TABLET | Freq: Every day | ORAL | Status: DC
  Administered 2022-04-29 – 2022-05-03 (×6): 5 mg via ORAL
  Filled 2022-04-28 (×6): qty 1

## 2022-04-28 MED ORDER — SODIUM CHLORIDE 0.9 % IV BOLUS
1000.0000 mL | Freq: Once | INTRAVENOUS | Status: AC
  Administered 2022-04-28: 1000 mL via INTRAVENOUS

## 2022-04-28 MED ORDER — CALCIUM CARBONATE ANTACID 500 MG PO CHEW: 1.0000 | CHEWABLE_TABLET | Freq: Three times a day (TID) | ORAL | Status: DC | PRN

## 2022-04-28 MED ORDER — HEPARIN BOLUS VIA INFUSION
3700.0000 [IU] | Freq: Once | INTRAVENOUS | Status: AC
  Administered 2022-04-28: 3700 [IU] via INTRAVENOUS
  Filled 2022-04-28: qty 3700

## 2022-04-28 MED ORDER — ALPRAZOLAM 0.5 MG PO TABS
1.0000 mg | ORAL_TABLET | Freq: Two times a day (BID) | ORAL | Status: DC
  Administered 2022-04-29 – 2022-05-04 (×12): 1 mg via ORAL
  Filled 2022-04-28 (×12): qty 2

## 2022-04-28 NOTE — ED Notes (Signed)
Patient transported to CT 

## 2022-04-28 NOTE — Assessment & Plan Note (Signed)
-  ASA and cretor ?

## 2022-04-28 NOTE — Assessment & Plan Note (Signed)
Likely due to dehydration and continuation of diuretics and the Benicar. ?-Hold Lasix, HCTZ, and Benicar ?-IV fluid: 2 L normal saline ?

## 2022-04-28 NOTE — Assessment & Plan Note (Signed)
-  prn Xanax ?

## 2022-04-28 NOTE — Assessment & Plan Note (Signed)
-  hold Bp meds due to Hypotensiion ?

## 2022-04-28 NOTE — Assessment & Plan Note (Signed)
Troponin level 362, 287.  Patient denies chest pain, but patient has altered mental status.  ?-Admitted to PCU as inpatient ?-Start IV heparin ?-Aspirin, Crestor ?-Trend troponin ?-Check A1c, FLP ?-Consulted Dr. Clayborn Bigness of cardiology ?- VQ scan to rule out PE ?-Lower extremity venous Doppler to rule out DVT ?

## 2022-04-28 NOTE — ED Notes (Signed)
Pt returned from MRI °

## 2022-04-28 NOTE — Assessment & Plan Note (Signed)
-  fall precaution ?-pt/ot when able to (not ordered yet) ?

## 2022-04-28 NOTE — ED Triage Notes (Addendum)
Arrived by EMS From home for AMS X5 days. Neighbor reports she hasnt eaten or had meds in 5 days. EMS reports slurred speech that neighbor reports X5 days. Neighbor reports fall X2 days ago. Hx alcohol abuse per EMS. EMS vitals Blood sugar 131, 100HR, 98% RA, b/p 75/41 ?

## 2022-04-28 NOTE — Consult Note (Signed)
ANTICOAGULATION CONSULT NOTE ? ?Pharmacy Consult for IV Heparin ?Indication: chest pain/ACS ? ?Patient Measurements: ?Height: '5\' 7"'$  (170.2 cm) ?Weight: 62 kg (136 lb 11 oz) ?IBW/kg (Calculated) : 61.6 ?Heparin Dosing Weight: 62 kg ? ?Labs: ?Recent Labs  ?  04/28/22 ?1220  ?HGB 14.9  ?HCT 45.9  ?PLT 336  ?CREATININE 2.89*  ?TROPONINIHS 362*  ? ? ?Estimated Creatinine Clearance: 16.1 mL/min (A) (by C-G formula based on SCr of 2.89 mg/dL (H)). ? ? ?Medical History: ?Past Medical History:  ?Diagnosis Date  ? Allergic state   ? Anginal pain ([place])   ? Prinzmetal's angina  ? Anxiety   ? Arthritis   ? osteoarthritis  ? Breast cancer ([place]) 1990  ? right breast cancer  ? Chronic pain   ? Chronic pain   ? COPD (chronic obstructive pulmonary disease) ([place])   ? Depression   ? Edema   ? Fibromyalgia   ? Foot fracture   ? Bilateral  ? GERD (gastroesophageal reflux disease)   ? History of kidney stones   ? Hypertension   ? Leg fracture, right   ? Low back pain   ? Lumbosacral neuritis   ? Medulloadrenal hyperfunction ([place])   ? Migraine headache   ? Peripheral neuropathy   ? Shoulder fracture, right   ? Stroke [facility])   ? TIA  ? Systemic lupus erythematosus ([place])   ? Thyroid disease   ? TIA (transient ischemic attack) 11/28/2013  ? Transient global amnesia 2011  ? ? ?Medications:  ?No anticoagulation prior to admission per my chart review ? ?Assessment: ?Patient is a 76 y/o F with medical history as above and including alcohol use disorder who presented to the ED 5/17 with AMS x 5 days / failure to thrive / falls. Labs notable for elevated troponin. Pharmacy consulted to initiate and manage heparin infusion for suspected ACS. ? ?Baseline CBC within normal limits. Baseline aPTT and PT-INR are pending.  ? ?Goal of Therapy:  ?Heparin level 0.3-0.7 units/ml ?Monitor platelets by anticoagulation protocol: Yes ?  ?Plan:  ?--Heparin 3700 unit IV bolus followed by continuous infusion at 700 units/hr ?--HL 8 hours after initiation of  infusion ?--Daily CBC per protocol while on IV heparin ? ?Benita Gutter ?04/28/2022,2:47 PM ? ? ?

## 2022-04-28 NOTE — Assessment & Plan Note (Addendum)
2D echo on 10/01/2016 showed EF 60 to 65% with grade 1 diastolic dysfunction.  Patient does not have leg edema or shortness of breath.  CHF seems to be compensated. ?-Check BNP ?-Hold torsemide and HCTZ due to worsening renal function ?

## 2022-04-28 NOTE — Assessment & Plan Note (Signed)
Patient has altered mental status and slurred speech.  Will need to rule out stroke.  CT head negative. ?-Frequent neurochecks ?-Follow-up urinalysis ?-MRI for brain to rule out stroke ?

## 2022-04-28 NOTE — ED Provider Notes (Signed)
? ?[facility] ?Provider Note ? ? ? Event Date/Time  ? First MD Initiated Contact with Patient 04/28/22 1237   ?  (approximate) ? ? ?History  ? ?Altered Mental Status ? ? ?HPI ? ?Donna Evans is a 76 y.o. female to the ER for reported confusion altered mental status for 5 days.  Reportedly having poor p.o. intake not taking medication for several days.  EMS reports that a neighbor noticed change in his speech last week and patient endorses 2 episodes of fainting spells.  Has mild chest tightness.  Reports dark diarrhea stool over the past week.  No abdominal pain.  Reportedly does have a history of alcohol abuse.  She denies any specific pain or discomfort. ?  ? ? ?Physical Exam  ? ?Triage Vital Signs: ?ED Triage Vitals  ?Enc Vitals Group  ?   BP 04/28/22 1223 (!) 70/59  ?   Pulse Rate 04/28/22 1223 96  ?   Resp 04/28/22 1223 16  ?   Temp 04/28/22 1223 97.7 ?F (36.5 ?C)  ?   Temp Source 04/28/22 1223 Oral  ?   SpO2 04/28/22 1223 99 %  ?   Weight 04/28/22 1220 136 lb 11 oz (62 kg)  ?   Height 04/28/22 1220 '5\' 7"'$  (1.702 m)  ?   Head Circumference --   ?   Peak Flow --   ?   Pain Score 04/28/22 1220 0  ?   Pain Loc --   ?   Pain Edu? --   ?   Excl. in [place]? --   ? ? ?Most recent vital signs: ?Vitals:  ? 04/28/22 1334 04/28/22 1335  ?BP: (!) 107/51   ?Pulse:  81  ?Resp: 15 13  ?Temp:    ?SpO2:  99%  ? ? ? ?Constitutional: Alert  ?Eyes: Conjunctivae are normal.  ?Head: Atraumatic. ?Nose: No congestion/rhinnorhea. ?Mouth/Throat: Mucous membranes are moist.   ?Neck: Painless ROM.  ?Cardiovascular:   Good peripheral circulation. ?Respiratory: Normal respiratory effort.  No retractions.  ?Gastrointestinal: Soft and nontender. Stool guaiac negative ?Musculoskeletal:  no deformity ?Neurologic:  MAE spontaneously. No gross focal neurologic deficits are appreciated.  ?Skin:  Skin is warm, dry and intact. No rash noted. ?Psychiatric: Mood and affect are normal. Speech and behavior are normal. ? ? ? ?ED  Results / Procedures / Treatments  ? ?Labs ?(all labs ordered are listed, but only abnormal results are displayed) ?Labs Reviewed  ?COMPREHENSIVE METABOLIC PANEL - Abnormal; Notable for the following components:  ?    Result Value  ? CO2 17 (*)   ? Glucose, Bld 141 (*)   ? BUN 76 (*)   ? Creatinine, Ser 2.89 (*)   ? Alkaline Phosphatase 470 (*)   ? GFR, Estimated 16 (*)   ? All other components within normal limits  ?TROPONIN I (HIGH SENSITIVITY) - Abnormal; Notable for the following components:  ? Troponin I (High Sensitivity) 362 (*)   ? All other components within normal limits  ?LACTIC ACID, PLASMA  ?CBC WITH DIFFERENTIAL/PLATELET  ?URINALYSIS, COMPLETE (UACMP) WITH MICROSCOPIC  ?APTT  ?PROTIME-INR  ?HEPARIN LEVEL (UNFRACTIONATED)  ? ? ? ?EKG ? ?ED ECG REPORT ?I, Merlyn Lot, the attending physician, personally viewed and interpreted this ECG. ? ? Date: 04/28/2022 ? EKG Time: 12:25 ? Rate: 95 ? Rhythm: sinus ? Axis: normal ? Intervals:rbbb ? ST&T Change: nonspecific st abn, no stemi ? ? ? ?RADIOLOGY ?Please see ED Course for my review and interpretation. ? ?  I personally reviewed all radiographic images ordered to evaluate for the above acute complaints and reviewed radiology reports and findings.  These findings were personally discussed with the patient.  Please see medical record for radiology report. ? ? ? ?PROCEDURES: ? ?Critical Care performed: Yes, see critical care procedure note(s) ? ?.Critical Care ?Performed by: Merlyn Lot, MD ?Authorized by: Merlyn Lot, MD  ? ?Critical care provider statement:  ?  Critical care time (minutes):  35 ?  Critical care was necessary to treat or prevent imminent or life-threatening deterioration of the following conditions:  Cardiac failure ?  Critical care was time spent personally by me on the following activities:  Ordering and performing treatments and interventions, ordering and review of laboratory studies, ordering and review of radiographic  studies, pulse oximetry, re-evaluation of patient's condition, review of old charts, obtaining history from patient or surrogate, examination of patient, evaluation of patient's response to treatment, discussions with primary provider, discussions with consultants and development of treatment plan with patient or surrogate ? ? ?MEDICATIONS ORDERED IN ED: ?Medications  ?aspirin EC tablet 81 mg (has no administration in time range)  ?heparin bolus via infusion 3,700 Units (has no administration in time range)  ?  Followed by  ?heparin ADULT infusion 100 units/mL (25000 units/235m) (has no administration in time range)  ?sodium chloride 0.9 % bolus 1,000 mL (0 mLs Intravenous Stopped 04/28/22 1417)  ?sodium chloride 0.9 % bolus 1,000 mL (1,000 mLs Intravenous New Bag/Given 04/28/22 1417)  ? ? ? ?IMPRESSION / MDM / ASSESSMENT AND PLAN / ED COURSE  ?I reviewed the triage vital signs and the nursing notes. ?             ?               ? ?Differential diagnosis includes, but is not limited to, dehydration, electrolyte abnormality, sepsis, SDH, IPH, CVA, medication misuse, polysubstance abuse, polypharmacy ? ?Patient presented to the ER for evaluation of symptoms as described above.  Patient found to be mildly hypotensive.  We will give IV fluids  This presenting complaint could reflect a potentially life-threatening illness therefore the patient will be placed on continuous pulse oximetry and telemetry for monitoring.  Laboratory evaluation will be sent to evaluate for the above complaints.   ? ? ?Clinical Course as of 04/28/22 1457  ?Wed Apr 28, 2022  ?1316 Lactate normal shows evidence of AKI.  Will give additional IV hydration no leukocytosis normal hemoglobin. [PR]  ?1317 CT head on my interpretation does not show any evidence of head bleed. [PR]  ?1435 Patient's troponin is elevated at 360.  Given her syncopal episode patient states that she is having some mild chest discomfort will heparinize will give aspirin.  May  be demand ischemia in the setting of dehydration we will continue with IV hydration.  Blood pressure improving after IV hydration.  Patient require admission to the hospital for further work-up.  Have consulted hospitalist. [PR]  ?  ?Clinical Course User Index ?[PR] RMerlyn Lot MD  ? ? ? ?FINAL CLINICAL IMPRESSION(S) / ED DIAGNOSES  ? ?Final diagnoses:  ?Weakness  ?AKI (acute kidney injury) ([place]  ?Elevated troponin  ? ? ? ?Rx / DC Orders  ? ?ED Discharge Orders   ? ? None  ? ?  ? ? ? ?Note:  This document was prepared using Dragon voice recognition software and may include unintentional dictation errors. ? ?  ?RMerlyn Lot MD ?04/28/22 1457 ? ?

## 2022-04-28 NOTE — H&P (Signed)
?History and Physical  ? ? ?Donna Evans EUM:[id number] DOB: 11-23-1946 DOA: 04/28/2022 ? ?Referring MD/NP/PA:  ? ?PCP: Jodi Marble, MD  ? ?Patient coming from:  The patient is coming from home.  At baseline, pt is independent for most of ADL.       ? ?Chief Complaint: AMS ? ?HPI: Donna Evans is a 76 y.o. female with medical history significant of hypertension, stroke, GERD, depression with anxiety, migraine headache, right breast cancer (s/p mastectomy), chronic pain syndrome, sCHF, CKD stage IIIa, who presents with AMS. ? ?Pt has altered mental status, cannot provide accurate medical history.  Per her friend at the bedside, patient had fall a month ago, but not in the past 2 weeks. She has been confused for about 5 days. She was found to have slurred speech.  She moves all extremities normally.  No facial droop, vision loss or hearing loss.  Per ED physician, patient had syncope episode at home, but per patient's friend at bedside, patient did not have syncope or passing out.  Patient denies chest pain, cough, shortness of breath.  Patient has intermittent mild diarrhea per her friends due to IBS, but no diarrhea today.  No nausea, vomiting or abdominal pain.  No symptoms of UTI.  Patient drinks alcohol sometimes, not heavily drinking per her friend. ? ?Pt was found to have hypotension with blood pressures 66/46, which improved to 107/51 after giving 2 L normal saline bolus in the emergency room. ? ?Data Reviewed and ED Course: pt was found to have WBC 10.4, lactic acid 1.8, troponin level 362, 287, positive D-dimer 1.12, worsening renal function with creatinine 2.89, BUN 76, GFR 16 (baseline creatinine 1.32 on 10/13/2021), temperature normal, heart rate 96, RR 16, oxygen saturation 99% on room air.  Chest x-ray negative.  CT head negative.  Patient is admitted to PCU as inpatient.  Dr. Clayborn Bigness of cardiology is consulted. ? ? ?EKG: I have personally reviewed.  Sinus rhythm, QTc 480, bilateral atrial  enlargement, incomplete right bundle blockade, early R wave progression. ? ? ?Review of Systems: Could not be reviewed accurately due to altered mental status. ? ? ?Allergy:  ?Allergies  ?Allergen Reactions  ? Codeine Anaphylaxis and Hives  ?  Reaction:  Unknown   ? Demerol [Meperidine] Anaphylaxis and Hives  ?  Reaction:  Unknown  ?  ? Latex Anaphylaxis and Rash  ? Morphine And Related Hives  ? Ciprofloxacin Nausea And Vomiting  ? Doxycycline Nausea And Vomiting  ? Flagyl [Metronidazole] Nausea And Vomiting  ?  Reaction:  Unknown   ? Fluconazole Hives  ? Influenza Vaccines Swelling  ?  Localized swelling  ? Tape Other (See Comments)  ?  Pt allergic to Adhesive tape and latex.(  Skin breaks out)  ? Valtrex [Valacyclovir Hcl] Hives  ? Duloxetine Hcl Other (See Comments)  ?  Reaction:  Unknown   ? Amoxicillin-Pot Clavulanate Nausea And Vomiting  ? Sulfamethoxazole-Trimethoprim Nausea And Vomiting  ? Valacyclovir Rash  ? ? ?Past Medical History:  ?Diagnosis Date  ? Allergic state   ? Anginal pain ([place])   ? Prinzmetal's angina  ? Anxiety   ? Arthritis   ? osteoarthritis  ? Breast cancer ([place]) 1990  ? right breast cancer  ? Chronic pain   ? Chronic pain   ? COPD (chronic obstructive pulmonary disease) ([place])   ? Depression   ? Edema   ? Fibromyalgia   ? Foot fracture   ? Bilateral  ? GERD (  gastroesophageal reflux disease)   ? History of kidney stones   ? Hypertension   ? Leg fracture, right   ? Low back pain   ? Lumbosacral neuritis   ? Medulloadrenal hyperfunction ([place])   ? Migraine headache   ? Peripheral neuropathy   ? Shoulder fracture, right   ? Stroke [facility])   ? TIA  ? Systemic lupus erythematosus ([place])   ? Thyroid disease   ? TIA (transient ischemic attack) 11/28/2013  ? Transient global amnesia 2011  ? ? ?Past Surgical History:  ?Procedure Laterality Date  ? ABDOMINAL HYSTERECTOMY  1981  ? APPENDECTOMY  1957  ? AUGMENTATION MAMMAPLASTY Bilateral 1990  ? saline submuscular  ? [place]  ? BILATERAL TOTAL  MASTECTOMY WITH AXILLARY LYMPH NODE DISSECTION  1990  ? BREAST IMPLANT EXCHANGE Bilateral 05/11/2016  ? Procedure: REMOVAL AND REPLACEMENT OF BREAST IMPLANTS ;  Surgeon: Irene Limbo, MD;  Location: [place];  Service: Plastics;  Laterality: Bilateral;  ? CAPSULECTOMY Bilateral 05/11/2016  ? Procedure: CAPSULECTOMY CAPSULORRAPHY ;  Surgeon: Irene Limbo, MD;  Location: [place];  Service: Plastics;  Laterality: Bilateral;  ? CATARACT EXTRACTION W/PHACO Right 12/26/2018  ? Procedure: CATARACT EXTRACTION PHACO AND INTRAOCULAR LENS PLACEMENT ([place]) RIGHT;  Surgeon: Birder Robson, MD;  Location: [facility];  Service: Ophthalmology;  Laterality: Right;  [place]  00:46 ?CDE 8.50 ?Fluid pack lot # [id number]  ? CATARACT EXTRACTION W/PHACO Left 01/23/2019  ? Procedure: CATARACT EXTRACTION PHACO AND INTRAOCULAR LENS PLACEMENT ([place]) LEFT;  Surgeon: Birder Robson, MD;  Location: [facility];  Service: Ophthalmology;  Laterality: Left;  [place]  00:45 ?CDE  8.41 ?fluid pack lot # [id number]  ? CHOLECYSTECTOMY  1979  ? COLONOSCOPY WITH PROPOFOL N/A 01/25/2018  ? Procedure: COLONOSCOPY WITH PROPOFOL;  Surgeon: Manya Silvas, MD;  Location: [facility];  Service: Endoscopy;  Laterality: N/A;  ? COPD    ? ESOPHAGOGASTRODUODENOSCOPY (EGD) WITH PROPOFOL N/A 03/07/2018  ? Procedure: ESOPHAGOGASTRODUODENOSCOPY (EGD) WITH PROPOFOL;  Surgeon: Manya Silvas, MD;  Location: [facility] ENDOSCOPY;  Service: Endoscopy;  Laterality: N/A;  ? FRACTURE SURGERY    ? HARDWARE REMOVAL Left 06/18/2020  ? Procedure: HARDWARE REMOVAL;  Surgeon: Lovell Sheehan, MD;  Location: [facility];  Service: Orthopedics;  Laterality: Left;  ? HUMERUS IM NAIL Left 12/31/2019  ? Procedure: INTRAMEDULLARY (IM) NAIL HUMERAL;  Surgeon: Lovell Sheehan, MD;  Location: [facility];  Service: Orthopedics;  Laterality: Left;  ? LIPOSUCTION Right 05/11/2016  ? Procedure: LIPOSUCTION;  Surgeon: Irene Limbo, MD;  Location: [place];   Service: Plastics;  Laterality: Right;  ? LIVER BIOPSY  2011  ? MASTECTOMY SUBCUTANEOUS Bilateral 1990  ? MASTOPEXY Bilateral 05/11/2016  ? Procedure: MASTOPEXY BILATERAL ;  Surgeon: Irene Limbo, MD;  Location: [place];  Service: Plastics;  Laterality: Bilateral;  ? ? ?Social History:  reports that she quit smoking about 16 years ago. Her smoking use included cigarettes. She has a 40.00 pack-year smoking history. She has never used smokeless tobacco. She reports that she does not currently use alcohol after a past usage of about 2.0 standard drinks per week. She reports that she does not use drugs. ? ?Family History:  ?Family History  ?Problem Relation Age of Onset  ? Atrial fibrillation Mother   ? Atrial fibrillation Sister   ? Cancer Sister   ? Diabetes Sister   ? Breast cancer Sister 74  ? Diabetes Father   ? Cancer Sister   ?  Diabetes Sister   ? Atrial fibrillation Sister   ? Kidney disease Maternal Aunt   ?  ? ?Prior to Admission medications   ?Medication Sig Start Date End Date Taking? Authorizing Provider  ?ALPRAZolam (XANAX) 1 MG tablet Take 1 tablet (1 mg total) by mouth 3 (three) times daily as needed for anxiety. ?Patient taking differently: Take 1 mg by mouth 2 (two) times daily.  10/22/16   Kathrine Haddock, NP  ?alum & mag hydroxide-simeth (MYLANTA) 200-200-20 MG/5ML suspension Take 15 mLs by mouth every 6 (six) hours as needed for indigestion or heartburn.    [provider]  ?butorphanol (STADOL) 10 MG/ML nasal spray Place 1 spray into the nose every 4 (four) hours as needed for headache. 10/22/16   Kathrine Haddock, NP  ?calcium carbonate (TUMS - DOSED IN MG ELEMENTAL CALCIUM) 500 MG chewable tablet Chew 1 tablet by mouth 3 (three) times daily as needed for indigestion or heartburn.    [provider]  ?Cholecalciferol (DIALYVITE VITAMIN D3 MAX) 1.25 MG (50000 UT) TABS Take 50,000 mcg by mouth once a week. Sunday     [provider]  ?Cyanocobalamin  (VITAMIN B-12 IJ) Inject 1,000 mg as directed every 30 (thirty) days.    [provider]  ?cyclobenzaprine (FLEXERIL) 10 MG tablet Take 10 mg by mouth 3 (three) times daily as needed for muscle spasms.

## 2022-04-29 ENCOUNTER — Inpatient Hospital Stay
Admit: 2022-04-29 | Discharge: 2022-04-29 | Disposition: A | Discharge status: Home / Self Care | Payer: PPO | Attending: Internal Medicine | Admitting: Internal Medicine

## 2022-04-29 ENCOUNTER — Inpatient Hospital Stay: Admit: 2022-04-29 | Payer: PPO

## 2022-04-29 ENCOUNTER — Inpatient Hospital Stay: Payer: PPO

## 2022-04-29 DIAGNOSIS — N179 Acute kidney failure, unspecified: Secondary | ICD-10-CM | POA: Diagnosis not present

## 2022-04-29 DIAGNOSIS — G9341 Metabolic encephalopathy: Secondary | ICD-10-CM

## 2022-04-29 DIAGNOSIS — N1831 Chronic kidney disease, stage 3a: Secondary | ICD-10-CM | POA: Diagnosis not present

## 2022-04-29 DIAGNOSIS — R778 Other specified abnormalities of plasma proteins: Secondary | ICD-10-CM | POA: Diagnosis not present

## 2022-04-29 LAB — LIPID PANEL
Cholesterol: 111 mg/dL (ref 0–200)
HDL: 42 mg/dL (ref >40)
LDL Cholesterol: 46 mg/dL (ref 0–99)
Total CHOL/HDL Ratio: 2.6 RATIO
Triglycerides: 114 mg/dL (ref <150)
VLDL: 23 mg/dL (ref 0–40)

## 2022-04-29 LAB — HEMOGLOBIN A1C
Hgb A1c MFr Bld: 5.1 % (ref 4.8–5.6)
Mean Plasma Glucose: 99.67 mg/dL

## 2022-04-29 LAB — GLUCOSE, CAPILLARY: Glucose-Capillary: 72 mg/dL (ref 70–99)

## 2022-04-29 LAB — URINALYSIS, COMPLETE (UACMP) WITH MICROSCOPIC
Bilirubin Urine: NEGATIVE
Glucose, UA: NEGATIVE mg/dL
Ketones, ur: 5 mg/dL — AB
Nitrite: POSITIVE — AB
Protein, ur: 30 mg/dL — AB
Specific Gravity, Urine: 1.013 (ref 1.005–1.030)
WBC, UA: >50 WBC/hpf — ABNORMAL HIGH (ref 0–5)
pH: 5 (ref 5.0–8.0)

## 2022-04-29 LAB — CBC
HCT: 37.4 % (ref 36.0–46.0)
Hemoglobin: 12.4 g/dL (ref 12.0–15.0)
MCH: 32 pg (ref 26.0–34.0)
MCHC: 33.2 g/dL (ref 30.0–36.0)
MCV: 96.4 fL (ref 80.0–100.0)
Platelets: 218 10*3/uL (ref 150–400)
RBC: 3.88 MIL/uL (ref 3.87–5.11)
RDW: 14.1 % (ref 11.5–15.5)
WBC: 7.4 10*3/uL (ref 4.0–10.5)
nRBC: 0 % (ref 0.0–0.2)

## 2022-04-29 LAB — BASIC METABOLIC PANEL
Anion gap: 10 (ref 5–15)
BUN: 67 mg/dL — ABNORMAL HIGH (ref 8–23)
CO2: 18 mmol/L — ABNORMAL LOW (ref 22–32)
Calcium: 8.2 mg/dL — ABNORMAL LOW (ref 8.9–10.3)
Chloride: 114 mmol/L — ABNORMAL HIGH (ref 98–111)
Creatinine, Ser: 2.04 mg/dL — ABNORMAL HIGH (ref 0.44–1.00)
GFR, Estimated: 25 mL/min — ABNORMAL LOW (ref >60)
Glucose, Bld: 74 mg/dL (ref 70–99)
Potassium: 4.1 mmol/L (ref 3.5–5.1)
Sodium: 142 mmol/L (ref 135–145)

## 2022-04-29 LAB — ECHOCARDIOGRAM COMPLETE
Height: 67 in
S' Lateral: 1.7 cm
Weight: 2155.22 oz

## 2022-04-29 LAB — HEPARIN LEVEL (UNFRACTIONATED)
Heparin Unfractionated: 0.44 IU/mL (ref 0.30–0.70)
Heparin Unfractionated: 0.78 IU/mL — ABNORMAL HIGH (ref 0.30–0.70)
Heparin Unfractionated: <0.1 IU/mL — ABNORMAL LOW (ref 0.30–0.70)

## 2022-04-29 MED ORDER — HEPARIN SODIUM (PORCINE) 5000 UNIT/ML IJ SOLN
5000.0000 [IU] | Freq: Three times a day (TID) | INTRAMUSCULAR | Status: DC
  Administered 2022-04-29 – 2022-05-04 (×15): 5000 [IU] via SUBCUTANEOUS
  Filled 2022-04-29 (×15): qty 1

## 2022-04-29 MED ORDER — TECHNETIUM TO 99M ALBUMIN AGGREGATED
4.0000 | Freq: Once | INTRAVENOUS | Status: AC
  Administered 2022-04-29: 4.3 via INTRAVENOUS

## 2022-04-29 NOTE — Consult Note (Signed)
[facility] Cardiology    SUBJECTIVE: Patient complains of feeling weak fatigue tired lightheaded dizzy at times multiple falls no recent injuries denies chest pain with complaints of back pain   Vitals:   04/29/22 0354 04/29/22 1113 04/29/22 1550 04/29/22 1900  BP:  139/76 119/75 101/65  Pulse:  73  88  Resp:  '19 17 16  '$ Temp: 97.7 F (36.5 C) 97.8 F (36.6 C) 97.7 F (36.5 C)   TempSrc: Oral Oral Oral   SpO2:  100% 100% 98%  Weight:      Height:         Intake/Output Summary (Last 24 hours) at 04/29/2022 2320 Last data filed at 04/29/2022 2100 Gross per 24 hour  Intake 883.8 ml  Output 400 ml  Net 483.8 ml      PHYSICAL EXAM  General: Well developed, well nourished, in no acute distress HEENT:  Normocephalic and atramatic Neck:  No JVD.  Lungs: Clear bilaterally to auscultation and percussion. Heart: HRRR . Normal S1 and S2 without gallops or murmurs.  Abdomen: Bowel sounds are positive, abdomen soft and non-tender  Msk:  Back normal, normal gait. Normal strength and tone for age. Extremities: No clubbing, cyanosis or edema.   Neuro: Alert and oriented X 3. Psych:  Good affect, responds appropriately   LABS: Basic Metabolic Panel: Recent Labs    04/28/22 1220 04/29/22 0504  NA 138 142  K 4.5 4.1  CL 106 114*  CO2 17* 18*  GLUCOSE 141* 74  BUN 76* 67*  CREATININE 2.89* 2.04*  CALCIUM 9.1 8.2*   Liver Function Tests: Recent Labs    04/28/22 1220  AST 41  ALT 22  ALKPHOS 470*  BILITOT 0.4  PROT 7.8  ALBUMIN 3.9   No results for input(s): LIPASE, AMYLASE in the last 72 hours. CBC: Recent Labs    04/28/22 1220 04/29/22 0504  WBC 10.4 7.4  NEUTROABS 7.6  --   HGB 14.9 12.4  HCT 45.9 37.4  MCV 97.0 96.4  PLT 336 218   Cardiac Enzymes: Recent Labs    04/28/22 1504  CKTOTAL 46   BNP: Invalid input(s): POCBNP D-Dimer: Recent Labs    04/28/22 1504  DDIMER 1.12*   Hemoglobin A1C: Recent Labs    04/28/22 2145  HGBA1C 5.1   Fasting  Lipid Panel: Recent Labs    04/29/22 0504  CHOL 111  HDL 42  LDLCALC 46  TRIG 114  CHOLHDL 2.6   Thyroid Function Tests: No results for input(s): TSH, T4TOTAL, T3FREE, THYROIDAB in the last 72 hours.  Invalid input(s): FREET3 Anemia Panel: No results for input(s): VITAMINB12, FOLATE, FERRITIN, TIBC, IRON, RETICCTPCT in the last 72 hours.  CT HEAD WO CONTRAST (5MM)  Result Date: 04/28/2022 CLINICAL DATA:  Head trauma, minor. EXAM: CT HEAD WITHOUT CONTRAST TECHNIQUE: Contiguous axial images were obtained from the base of the skull through the vertex without intravenous contrast. RADIATION DOSE REDUCTION: This exam was performed according to the departmental dose-optimization program which includes automated exposure control, adjustment of the mA and/or kV according to patient size and/or use of iterative reconstruction technique. COMPARISON:  Prior head CT examinations 12/15/2021 and earlier. Brain MRI 10/01/2016. FINDINGS: Brain: Mild generalized parenchymal atrophy. Mild patchy and ill-defined hypoattenuation within the cerebral white matter, nonspecific but compatible with chronic small vessel ischemic disease. There is no acute intracranial hemorrhage. No demarcated cortical infarct. No extra-axial fluid collection. No evidence of an intracranial mass. No midline shift. Vascular: No hyperdense vessel.  Atherosclerotic calcifications.  Skull: Normal. Negative for fracture or focal lesion. Sinuses/Orbits: No mass or acute finding within the imaged orbits. Moderate-sized fluid level within the right sphenoid sinus. Trace ankle thickening within the left maxillary sinus at the imaged levels IMPRESSION: No evidence of acute intracranial abnormality. Mild chronic small vessel ischemic changes within the cerebral white matter. Mild generalized parenchymal atrophy. Paranasal sinus disease at the imaged levels, as described. Electronically Signed   By: Kellie Simmering D.O.   On: 04/28/2022 13:04   MR BRAIN  WO CONTRAST  Result Date: 04/28/2022 CLINICAL DATA:  Altered mental status.  Confusion. EXAM: MRI HEAD WITHOUT CONTRAST TECHNIQUE: Multiplanar, multiecho pulse sequences of the brain and surrounding structures were obtained without intravenous contrast. COMPARISON:  09/30/2016 FINDINGS: Brain: No acute infarct, mass effect or extra-axial collection. No acute or chronic hemorrhage. There is multifocal hyperintense T2-weighted signal within the white matter. Generalized cerebral volume loss. The midline structures are normal. Vascular: Major flow voids are preserved. Skull and upper cervical spine: Normal calvarium and skull base. Visualized upper cervical spine and soft tissues are normal. Sinuses/Orbits:No paranasal sinus fluid levels or advanced mucosal thickening. No mastoid or middle ear effusion. Normal orbits. IMPRESSION: 1. No acute intracranial abnormality. 2. Findings of chronic microvascular ischemia and cerebral volume loss. Electronically Signed   By: Ulyses Jarred M.D.   On: 04/28/2022 20:59   NM Pulmonary Perfusion  Result Date: 04/29/2022 CLINICAL DATA:  Concern for pulmonary embolism.  Elevated D-dimer EXAM: NUCLEAR MEDICINE PERFUSION LUNG SCAN TECHNIQUE: Perfusion images were obtained in multiple projections after intravenous injection of radiopharmaceutical. RADIOPHARMACEUTICALS:  4.3 mCi Tc-57mMAA COMPARISON:  Radiograph 04/28/2022 FINDINGS: No wedge-shaped peripheral perfusion defect within LEFT or RIGHT lung to suggest acute pulmonary embolism. Small defect at the RIGHT lung base is favored atelectasis IMPRESSION: No evidence acute pulmonary embolism. Electronically Signed   By: SSuzy BouchardM.D.   On: 04/29/2022 09:35   UKoreaVenous Img Lower Bilateral (DVT)  Result Date: 04/29/2022 CLINICAL DATA:  Positive D-dimer.  Evaluate for DVT. EXAM: BILATERAL LOWER EXTREMITY VENOUS DOPPLER ULTRASOUND TECHNIQUE: Gray-scale sonography with graded compression, as well as color Doppler and duplex  ultrasound were performed to evaluate the lower extremity deep venous systems from the level of the common femoral vein and including the common femoral, femoral, profunda femoral, popliteal and calf veins including the posterior tibial, peroneal and gastrocnemius veins when visible. The superficial great saphenous vein was also interrogated. Spectral Doppler was utilized to evaluate flow at rest and with distal augmentation maneuvers in the common femoral, femoral and popliteal veins. COMPARISON:  None Available. FINDINGS: RIGHT LOWER EXTREMITY Common Femoral Vein: No evidence of thrombus. Normal compressibility, respiratory phasicity and response to augmentation. Saphenofemoral Junction: No evidence of thrombus. Normal compressibility and flow on color Doppler imaging. Profunda Femoral Vein: No evidence of thrombus. Normal compressibility and flow on color Doppler imaging. Femoral Vein: No evidence of thrombus. Normal compressibility, respiratory phasicity and response to augmentation. Popliteal Vein: No evidence of thrombus. Normal compressibility, respiratory phasicity and response to augmentation. Calf Veins: No evidence of thrombus. Normal compressibility and flow on color Doppler imaging. Superficial Great Saphenous Vein: No evidence of thrombus. Normal compressibility. Venous Reflux:  None. Other Findings:  None. LEFT LOWER EXTREMITY Common Femoral Vein: No evidence of thrombus. Normal compressibility, respiratory phasicity and response to augmentation. Saphenofemoral Junction: No evidence of thrombus. Normal compressibility and flow on color Doppler imaging. Profunda Femoral Vein: No evidence of thrombus. Normal compressibility and flow on color Doppler imaging. Femoral Vein:  No evidence of thrombus. Normal compressibility, respiratory phasicity and response to augmentation. Popliteal Vein: No evidence of thrombus. Normal compressibility, respiratory phasicity and response to augmentation. Calf Veins: No  evidence of thrombus. Normal compressibility and flow on color Doppler imaging. Superficial Great Saphenous Vein: No evidence of thrombus. Normal compressibility. Venous Reflux:  None. Other Findings:  None. IMPRESSION: No evidence of deep venous thrombosis in either lower extremity. Electronically Signed   By: Jacqulynn Cadet M.D.   On: 04/29/2022 10:04   DG Chest Port 1 View  Result Date: 04/28/2022 CLINICAL DATA:  Altered mental status, questionable sepsis, slurred speech, fall. EXAM: PORTABLE CHEST 1 VIEW COMPARISON:  12/18/2021. FINDINGS: Trachea is midline. Heart size normal. Biapical pleuroparenchymal scarring. Lungs are clear. No pleural fluid. Healed fractures of the posterior left seventh and eighth ribs. Healed left humeral neck fracture. IMPRESSION: No acute findings. Electronically Signed   By: Lorin Picket M.D.   On: 04/28/2022 13:13   ECHOCARDIOGRAM COMPLETE  Result Date: 04/29/2022    ECHOCARDIOGRAM REPORT   Patient Name:   Donna Evans Date of Exam: 04/29/2022 Medical Rec #:  [id number]      Height:       67.0 in Accession #:    [id number]     Weight:       134.7 lb Date of Birth:  [birth year]       BSA:          1.710 m Patient Age:    76 years       BP:           139/76 mmHg Patient Gender: F              HR:           73 bpm. Exam Location:  [facility] Procedure: 2D Echo, Cardiac Doppler and Color Doppler Indications:     Syncope R55  History:         Patient has prior history of Echocardiogram examinations, most                  recent 10/01/2016. COPD; Risk Factors:Hypertension.  Sonographer:     Sherrie Sport Referring Phys:  [id number] Soledad Gerlach NIU Diagnosing Phys: Yolonda Kida MD  Sonographer Comments: Technically challenging study due to limited acoustic windows, no apical window and no subcostal window. Image acquisition challenging due to breast implants. IMPRESSIONS  1. Left ventricular ejection fraction, by estimation, is 50 to 55%. The left ventricle has normal function. The left  ventricle has no regional wall motion abnormalities. There is moderate concentric left ventricular hypertrophy. Left ventricular diastolic parameters are consistent with Grade I diastolic dysfunction (impaired relaxation).  2. Right ventricular systolic function is normal. The right ventricular size is normal.  3. The mitral valve is normal in structure. No evidence of mitral valve regurgitation.  4. Tricuspid valve regurgitation is mild to moderate.  5. The aortic valve is normal in structure. Aortic valve regurgitation is not visualized. FINDINGS  Left Ventricle: Left ventricular ejection fraction, by estimation, is 50 to 55%. The left ventricle has normal function. The left ventricle has no regional wall motion abnormalities. The left ventricular internal cavity size was normal in size. There is  moderate concentric left ventricular hypertrophy. Left ventricular diastolic parameters are consistent with Grade I diastolic dysfunction (impaired relaxation). Right Ventricle: The right ventricular size is normal. No increase in right ventricular wall thickness. Right ventricular systolic function is normal. Left Atrium: Left atrial size was normal in  size. Right Atrium: Right atrial size was normal in size. Pericardium: There is no evidence of pericardial effusion. Mitral Valve: The mitral valve is normal in structure. No evidence of mitral valve regurgitation. Tricuspid Valve: The tricuspid valve is normal in structure. Tricuspid valve regurgitation is mild to moderate. Aortic Valve: The aortic valve is normal in structure. Aortic valve regurgitation is not visualized. Pulmonic Valve: The pulmonic valve was normal in structure. Pulmonic valve regurgitation is not visualized. Aorta: The ascending aorta was not well visualized. IAS/Shunts: No atrial level shunt detected by color flow Doppler.  LEFT VENTRICLE PLAX 2D LVIDd:         2.30 cm LVIDs:         1.70 cm LV PW:         1.60 cm LV IVS:        1.30 cm LVOT diam:      2.00 cm LVOT Area:     3.14 cm  LEFT ATRIUM         Index LA diam:    2.30 cm 1.35 cm/m                        PULMONIC VALVE AORTA                 PV Vmax:        1.05 m/s Ao Root diam: 3.33 cm PV Vmean:       80.800 cm/s                       PV VTI:         0.175 m                       PV Peak grad:   4.4 mmHg                       PV Mean grad:   3.0 mmHg                       RVOT Peak grad: 5 mmHg   SHUNTS Systemic Diam: 2.00 cm Pulmonic VTI:  0.211 m Mckade Gurka D Alexzia Kasler MD Electronically signed by Yolonda Kida MD Signature Date/Time: 04/29/2022/5:07:03 PM    Final      Echo preserved left ventricular function EF around 50 to 55%  TELEMETRY: Normal sinus rhythm rate of 80:  ASSESSMENT AND PLAN:  Principal Problem:   Elevated troponin Active Problems:   Generalized anxiety disorder   Hypertension, benign   Stroke [facility])   Fall   Acute renal failure superimposed on stage 3a chronic kidney disease (HCC)   Chronic diastolic CHF (congestive heart failure) (HCC)   Acute metabolic encephalopathy    Impression  AMS Encephalopathy  Frequent falls Dehydration  Acute/ Chronic Renal insufficiency  Generalized Weakness  Atypical Chest/Back aPain Elevated troponins . Plan Probably demand ischemia- d/c ACS heparin Consider Neurology input Aggressive hydration  Physical Therapy for ataxia  Echo for Hx CHF-D Consider functional study  inpt/outpt I doubt  ACS/ or significant cardiac component  I recommend conservative cardiac input    Yolonda Kida, MD 04/29/2022 11:20 PM

## 2022-04-29 NOTE — Consult Note (Signed)
[place] for IV Heparin Indication: chest pain/ACS  Patient Measurements: Height: '5\' 7"'$  (170.2 cm) Weight: 61.1 kg (134 lb 11.2 oz) IBW/kg (Calculated) : 61.6 Heparin Dosing Weight: 62 kg  Labs: Recent Labs    04/28/22 1220 04/28/22 1504 04/28/22 2145 04/29/22 0004 04/29/22 0504 04/29/22 1001  HGB 14.9  --   --   --  12.4  --   HCT 45.9  --   --   --  37.4  --   PLT 336  --   --   --  218  --   APTT  --  34  --   --   --   --   LABPROT  --  13.2  --   --   --   --   INR  --  1.0  --   --   --   --   HEPARINUNFRC  --   --   --  0.78*  --  0.44  CREATININE 2.89*  --   --   --  2.04*  --   CKTOTAL  --  46  --   --   --   --   TROPONINIHS 362* 287* 191*  --   --   --    Estimated Creatinine Clearance: 22.6 mL/min (A) (by C-G formula based on SCr of 2.04 mg/dL (H)).  Medical History: Past Medical History:  Diagnosis Date   Allergic state    Anginal pain ([place])    Prinzmetal's angina   Anxiety    Arthritis    osteoarthritis   Breast cancer ([place]) 1990   right breast cancer   Chronic pain    Chronic pain    COPD (chronic obstructive pulmonary disease) (HCC)    Depression    Edema    Fibromyalgia    Foot fracture    Bilateral   GERD (gastroesophageal reflux disease)    History of kidney stones    Hypertension    Leg fracture, right    Low back pain    Lumbosacral neuritis    Medulloadrenal hyperfunction (HCC)    Migraine headache    Peripheral neuropathy    Shoulder fracture, right    Stroke (HCC)    TIA   Systemic lupus erythematosus (HCC)    Thyroid disease    TIA (transient ischemic attack) 11/28/2013   Transient global amnesia 2011    Medications:  No anticoagulation prior to admission per my chart review  Assessment: Patient is a 76 y/o F with medical history as above and including alcohol use disorder who presented to the ED 5/17 with AMS x 5 days / failure to thrive / falls. Labs notable for elevated  troponin. Pharmacy consulted to initiate and manage heparin infusion for suspected ACS.  Baseline CBC within normal limits. Baseline aPTT and PT-INR are pending.   Goal of Therapy:  Heparin level 0.3-0.7 units/ml Monitor platelets by anticoagulation protocol: Yes  Results: Date/time HL/aPTT Comment 5/18 0004 HL 0.78 Supra-therapeutic 5/18 1001 Hl 0.44  Therapeutic x 1   Plan:  Heparin level therapeutic x 1 at 600 un/hr Continue current heparin rate Repeat HL in 8 hrs to confirm Daily CBC per protocol while on IV heparin  Findlay Dagher Rodriguez-Guzman PharmD, BCPS 04/29/2022 10:48 AM

## 2022-04-29 NOTE — Plan of Care (Signed)
  Problem: Education: Goal: Knowledge of General Education information will improve Description: Including pain rating scale, medication(s)/side effects and non-pharmacologic comfort measures Outcome: Progressing   Problem: Clinical Measurements: Goal: Ability to maintain clinical measurements within normal limits will improve Outcome: Progressing Goal: Will remain free from infection Outcome: Progressing Goal: Diagnostic test results will improve Outcome: Progressing Goal: Respiratory complications will improve Outcome: Progressing Goal: Cardiovascular complication will be avoided Outcome: Progressing   Problem: Pain Managment: Goal: General experience of comfort will improve Outcome: Progressing   Problem: Safety: Goal: Ability to remain free from injury will improve Outcome: Progressing   

## 2022-04-29 NOTE — Progress Notes (Signed)
Brief cardiology consult note  Impression  AMS Encephalopathy  Frequent falls Dehydration  Acute/ Chronic Renal insufficiency  Generalized Weakness  Atypical Chest/Back aPain Elevated troponins . Plan Probably demand ischemia- d/c ACS heparin Consider Neurology input Aggressive hydration  Physical Therapy for ataxia  Echo for Hx CHF-D Consider functional study  inpt/outpt I doubt  ACS/ or significant cardiac component  I recommend conservative cardiac input Full note follow   Thanks  Zainab Crumrine

## 2022-04-29 NOTE — Consult Note (Signed)
[place] for IV Heparin Indication: chest pain/ACS  Patient Measurements: Height: '5\' 7"'$  (170.2 cm) Weight: 61.1 kg (134 lb 11.2 oz) IBW/kg (Calculated) : 61.6 Heparin Dosing Weight: 62 kg  Labs: Recent Labs    04/28/22 1220 04/28/22 1504 04/28/22 2145 04/29/22 0004  HGB 14.9  --   --   --   HCT 45.9  --   --   --   PLT 336  --   --   --   APTT  --  34  --   --   LABPROT  --  13.2  --   --   INR  --  1.0  --   --   HEPARINUNFRC  --   --   --  0.78*  CREATININE 2.89*  --   --   --   CKTOTAL  --  46  --   --   TROPONINIHS 362* 287* 191*  --      Estimated Creatinine Clearance: 16 mL/min (A) (by C-G formula based on SCr of 2.89 mg/dL (H)).   Medical History: Past Medical History:  Diagnosis Date   Allergic state    Anginal pain ([place])    Prinzmetal's angina   Anxiety    Arthritis    osteoarthritis   Breast cancer ([place]) 1990   right breast cancer   Chronic pain    Chronic pain    COPD (chronic obstructive pulmonary disease) (HCC)    Depression    Edema    Fibromyalgia    Foot fracture    Bilateral   GERD (gastroesophageal reflux disease)    History of kidney stones    Hypertension    Leg fracture, right    Low back pain    Lumbosacral neuritis    Medulloadrenal hyperfunction (HCC)    Migraine headache    Peripheral neuropathy    Shoulder fracture, right    Stroke (HCC)    TIA   Systemic lupus erythematosus (HCC)    Thyroid disease    TIA (transient ischemic attack) 11/28/2013   Transient global amnesia 2011    Medications:  No anticoagulation prior to admission per my chart review  Assessment: Patient is a 76 y/o F with medical history as above and including alcohol use disorder who presented to the ED 5/17 with AMS x 5 days / failure to thrive / falls. Labs notable for elevated troponin. Pharmacy consulted to initiate and manage heparin infusion for suspected ACS.  Baseline CBC within normal limits. Baseline  aPTT and PT-INR are pending.   Goal of Therapy:  Heparin level 0.3-0.7 units/ml Monitor platelets by anticoagulation protocol: Yes  5/18 0004 HL 0.78, supratherapeutic   Plan:  --Decrease heparin infusion to 600 units/hr --Recheck HL 8 hours after rate change --Daily CBC per protocol while on IV heparin  Renda Rolls, PharmD, [facility] 04/29/2022 12:36 AM

## 2022-04-29 NOTE — Progress Notes (Signed)
*  PRELIMINARY RESULTS* Echocardiogram 2D Echocardiogram has been performed.  Donna Evans 04/29/2022, 1:56 PM

## 2022-04-29 NOTE — Progress Notes (Signed)
PROGRESS NOTE    DALMA PANCHAL  TKW:[id number] DOB: 28-Aug-1946 DOA: 04/28/2022 PCP: Jodi Marble, MD   Assessment & Plan:   Principal Problem:   Elevated troponin Active Problems:   Generalized anxiety disorder   Hypertension, benign   Stroke [facility])   Fall   Acute renal failure superimposed on stage 3a chronic kidney disease (HCC)   Chronic diastolic CHF (congestive heart failure) (HCC)   Acute metabolic encephalopathy  Assessment and Plan: Elevated troponin: likely secondary to demand ischemia. Continue on tele. Cardio consulted. V/Q scan neg for PE. [place] of b/l LE was neg for DVTs  Acute metabolic encephalopathy: etiology unclear, possibly medication induced. Pt was unable to keep track of which medications were taken when. CT head shows no acute intracranial abnormalities. MRI brain shows not acute intracranial abnormality.    Chronic diastolic CHF: echo showed EF 60 to 65% with grade 1 diastolic dysfunction in 9242. CHF appears compensated. Holding torsemide   AKI on CKDIIIa: likely secondary to dehydration & use of diuretics. Continue to hold torsemide, HCTZ & olmesartan. Cr is trending down from day prior    Fall: will consult PT/OT    Hx of CVA: continue on aspirin, statin    HTN: holding all anti-HTN meds as BP is low normal    Generalized anxiety disorder: severity unknown. Xanax prn       DVT prophylaxis: heparin sq Code Status: DNR Family Communication: discussed pt's care w/ pt's family at bedside and answered their questions  Disposition Plan: depends on PT/OT recs   Level of care: Progressive  Status is: Inpatient Remains inpatient appropriate because: severity of illness   Consultants:    Procedures:   Antimicrobials:   Subjective: Pt is confused   Objective: Vitals:   04/28/22 2117 04/28/22 2133 04/28/22 2327 04/29/22 0354  BP: 115/71     Pulse: 82     Resp: 20     Temp: 97.9 F (36.6 C)  97.9 F (36.6 C) 97.7 F (36.5 C)   TempSrc: Oral  Oral Oral  SpO2: (!) 87%     Weight:  61.1 kg    Height:  '5\' 7"'$  (1.702 m)      Intake/Output Summary (Last 24 hours) at 04/29/2022 0746 Last data filed at 04/29/2022 0230 Gross per 24 hour  Intake --  Output 200 ml  Net -200 ml   Filed Weights   04/28/22 1220 04/28/22 2133  Weight: 62 kg 61.1 kg    Examination:  General exam: Appears calm and comfortable  Respiratory system: Clear to auscultation. Respiratory effort normal. Cardiovascular system: S1 & S2 +. No rubs, gallops or clicks.  Gastrointestinal system: Abdomen is nondistended, soft and nontender. Normal bowel sounds heard. Central nervous system: Alert and awake. Moves all extremities  Psychiatry: Judgement and insight appears poor. Flat mood and affect     Data Reviewed: I have personally reviewed following labs and imaging studies  CBC: Recent Labs  Lab 04/28/22 1220 04/29/22 0504  WBC 10.4 7.4  NEUTROABS 7.6  --   HGB 14.9 12.4  HCT 45.9 37.4  MCV 97.0 96.4  PLT 336 683   Basic Metabolic Panel: Recent Labs  Lab 04/28/22 1220 04/29/22 0504  NA 138 142  K 4.5 4.1  CL 106 114*  CO2 17* 18*  GLUCOSE 141* 74  BUN 76* 67*  CREATININE 2.89* 2.04*  CALCIUM 9.1 8.2*   GFR: Estimated Creatinine Clearance: 22.6 mL/min (A) (by C-G formula based on SCr of  2.04 mg/dL (H)). Liver Function Tests: Recent Labs  Lab 04/28/22 1220  AST 41  ALT 22  ALKPHOS 470*  BILITOT 0.4  PROT 7.8  ALBUMIN 3.9   No results for input(s): LIPASE, AMYLASE in the last 168 hours. No results for input(s): AMMONIA in the last 168 hours. Coagulation Profile: Recent Labs  Lab 04/28/22 1504  INR 1.0   Cardiac Enzymes: Recent Labs  Lab 04/28/22 1504  CKTOTAL 46   BNP (last 3 results) No results for input(s): PROBNP in the last 8760 hours. HbA1C: Recent Labs    04/28/22 2145  HGBA1C 5.1   CBG: No results for input(s): GLUCAP in the last 168 hours. Lipid Profile: Recent Labs    04/29/22 0504   CHOL 111  HDL 42  LDLCALC 46  TRIG 114  CHOLHDL 2.6   Thyroid Function Tests: No results for input(s): TSH, T4TOTAL, FREET4, T3FREE, THYROIDAB in the last 72 hours. Anemia Panel: No results for input(s): VITAMINB12, FOLATE, FERRITIN, TIBC, IRON, RETICCTPCT in the last 72 hours. Sepsis Labs: Recent Labs  Lab 04/28/22 1220  LATICACIDVEN 1.8    No results found for this or any previous visit (from the past 240 hour(s)).       Radiology Studies: CT HEAD WO CONTRAST (5MM)  Result Date: 04/28/2022 CLINICAL DATA:  Head trauma, minor. EXAM: CT HEAD WITHOUT CONTRAST TECHNIQUE: Contiguous axial images were obtained from the base of the skull through the vertex without intravenous contrast. RADIATION DOSE REDUCTION: This exam was performed according to the departmental dose-optimization program which includes automated exposure control, adjustment of the mA and/or kV according to patient size and/or use of iterative reconstruction technique. COMPARISON:  Prior head CT examinations 12/15/2021 and earlier. Brain MRI 10/01/2016. FINDINGS: Brain: Mild generalized parenchymal atrophy. Mild patchy and ill-defined hypoattenuation within the cerebral white matter, nonspecific but compatible with chronic small vessel ischemic disease. There is no acute intracranial hemorrhage. No demarcated cortical infarct. No extra-axial fluid collection. No evidence of an intracranial mass. No midline shift. Vascular: No hyperdense vessel.  Atherosclerotic calcifications. Skull: Normal. Negative for fracture or focal lesion. Sinuses/Orbits: No mass or acute finding within the imaged orbits. Moderate-sized fluid level within the right sphenoid sinus. Trace ankle thickening within the left maxillary sinus at the imaged levels IMPRESSION: No evidence of acute intracranial abnormality. Mild chronic small vessel ischemic changes within the cerebral white matter. Mild generalized parenchymal atrophy. Paranasal sinus disease at  the imaged levels, as described. Electronically Signed   By: Kellie Simmering D.O.   On: 04/28/2022 13:04   MR BRAIN WO CONTRAST  Result Date: 04/28/2022 CLINICAL DATA:  Altered mental status.  Confusion. EXAM: MRI HEAD WITHOUT CONTRAST TECHNIQUE: Multiplanar, multiecho pulse sequences of the brain and surrounding structures were obtained without intravenous contrast. COMPARISON:  09/30/2016 FINDINGS: Brain: No acute infarct, mass effect or extra-axial collection. No acute or chronic hemorrhage. There is multifocal hyperintense T2-weighted signal within the white matter. Generalized cerebral volume loss. The midline structures are normal. Vascular: Major flow voids are preserved. Skull and upper cervical spine: Normal calvarium and skull base. Visualized upper cervical spine and soft tissues are normal. Sinuses/Orbits:No paranasal sinus fluid levels or advanced mucosal thickening. No mastoid or middle ear effusion. Normal orbits. IMPRESSION: 1. No acute intracranial abnormality. 2. Findings of chronic microvascular ischemia and cerebral volume loss. Electronically Signed   By: Ulyses Jarred M.D.   On: 04/28/2022 20:59   DG Chest Port 1 View  Result Date: 04/28/2022 CLINICAL DATA:  Altered  mental status, questionable sepsis, slurred speech, fall. EXAM: PORTABLE CHEST 1 VIEW COMPARISON:  12/18/2021. FINDINGS: Trachea is midline. Heart size normal. Biapical pleuroparenchymal scarring. Lungs are clear. No pleural fluid. Healed fractures of the posterior left seventh and eighth ribs. Healed left humeral neck fracture. IMPRESSION: No acute findings. Electronically Signed   By: Lorin Picket M.D.   On: 04/28/2022 13:13        Scheduled Meds:  ALPRAZolam  1 mg Oral BID   aspirin EC  81 mg Oral Daily   rosuvastatin  10 mg Oral Daily   zolpidem  5 mg Oral QHS   Continuous Infusions:  heparin 600 Units/hr (04/29/22 0100)     LOS: 1 day    Time spent: 35 mins     Wyvonnia Dusky, MD [facility] Pager [phone]  If 7PM-7AM, please contact night-coverage 04/29/2022, 7:46 AM

## 2022-04-30 DIAGNOSIS — N1831 Chronic kidney disease, stage 3a: Secondary | ICD-10-CM | POA: Diagnosis not present

## 2022-04-30 DIAGNOSIS — N179 Acute kidney failure, unspecified: Secondary | ICD-10-CM | POA: Diagnosis not present

## 2022-04-30 DIAGNOSIS — R778 Other specified abnormalities of plasma proteins: Secondary | ICD-10-CM | POA: Diagnosis not present

## 2022-04-30 DIAGNOSIS — G9341 Metabolic encephalopathy: Secondary | ICD-10-CM | POA: Diagnosis not present

## 2022-04-30 LAB — BASIC METABOLIC PANEL
Anion gap: 7 (ref 5–15)
BUN: 52 mg/dL — ABNORMAL HIGH (ref 8–23)
CO2: 21 mmol/L — ABNORMAL LOW (ref 22–32)
Calcium: 8.4 mg/dL — ABNORMAL LOW (ref 8.9–10.3)
Chloride: 113 mmol/L — ABNORMAL HIGH (ref 98–111)
Creatinine, Ser: 1.53 mg/dL — ABNORMAL HIGH (ref 0.44–1.00)
GFR, Estimated: 35 mL/min — ABNORMAL LOW (ref >60)
Glucose, Bld: 104 mg/dL — ABNORMAL HIGH (ref 70–99)
Potassium: 3.8 mmol/L (ref 3.5–5.1)
Sodium: 141 mmol/L (ref 135–145)

## 2022-04-30 LAB — CBC
HCT: 34.6 % — ABNORMAL LOW (ref 36.0–46.0)
Hemoglobin: 11.3 g/dL — ABNORMAL LOW (ref 12.0–15.0)
MCH: 31.1 pg (ref 26.0–34.0)
MCHC: 32.7 g/dL (ref 30.0–36.0)
MCV: 95.3 fL (ref 80.0–100.0)
Platelets: 201 10*3/uL (ref 150–400)
RBC: 3.63 MIL/uL — ABNORMAL LOW (ref 3.87–5.11)
RDW: 13.9 % (ref 11.5–15.5)
WBC: 5.9 10*3/uL (ref 4.0–10.5)
nRBC: 0 % (ref 0.0–0.2)

## 2022-04-30 LAB — GLUCOSE, CAPILLARY: Glucose-Capillary: 113 mg/dL — ABNORMAL HIGH (ref 70–99)

## 2022-04-30 MED ORDER — EPINEPHRINE PF 1 MG/ML IJ SOLN
INTRAMUSCULAR | Status: AC
Start: 2022-04-30 — End: 2022-05-01
  Filled 2022-04-30: qty 1

## 2022-04-30 MED ORDER — SODIUM CHLORIDE 0.9% FLUSH
3.0000 mL | Freq: Two times a day (BID) | INTRAVENOUS | Status: DC
  Administered 2022-04-30 – 2022-05-04 (×8): 3 mL via INTRAVENOUS

## 2022-04-30 MED ORDER — ACETAMINOPHEN 325 MG PO TABS
650.0000 mg | ORAL_TABLET | Freq: Four times a day (QID) | ORAL | Status: DC | PRN
  Administered 2022-05-01 – 2022-05-03 (×2): 650 mg via ORAL
  Filled 2022-04-30 (×2): qty 2

## 2022-04-30 NOTE — NC FL2 (Signed)
[place] LEVEL OF CARE SCREENING TOOL     IDENTIFICATION  Patient Name: Donna Evans Birthdate: March 08, 1946 Sex: female Admission Date (Current Location): 04/28/2022  [facility] and [place] Number:  Engineering geologist and Address:  [facility], [address], [place], [place] [address]      Provider Number: [id number]  Attending Physician Name and Address:  Wyvonnia Dusky, MD  Relative Name and Phone Number:  [name] (sister) [phone]    Current Level of Care: Hospital Recommended Level of Care: [place] Prior Approval Number:    Date Approved/Denied:   PASRR Number: [id number]  Discharge Plan: SNF    Current Diagnoses: Patient Active Problem List   Diagnosis Date Noted   Elevated troponin 04/28/2022   Stroke ([place]) 04/28/2022   Fall 04/28/2022   Acute renal failure superimposed on stage 3a chronic kidney disease ([place]) 04/28/2022   Chronic diastolic CHF (congestive heart failure) ([place]) 43/32/9518   Acute metabolic encephalopathy 84/16/6063   Long term current use of opiate analgesic 07/04/2017   Long term prescription opiate use 07/04/2017   Opiate use 07/04/2017   Bursitis of shoulder 07/04/2017   Closed fracture of upper end of humerus 07/04/2017   Knee pain 07/04/2017   Localized, primary osteoarthritis 07/04/2017   Osteoarthritis of knee 07/04/2017   Pain in limb 07/04/2017   Sprain of wrist 07/04/2017   Pain in joint involving ankle and foot 07/04/2017   Impingement syndrome of left shoulder region 06/29/2017   Trochanteric bursitis 06/29/2017   AKI (acute kidney injury) ([place]) 10/01/2016   Syncope 09/30/2016   Migraine headache 08/10/2016   Controlled substance agreement signed 06/12/2016   Insomnia 06/12/2016   Hypertension, benign 02/04/2016   Easy bruisability 11/13/2015   Osteoporosis 11/13/2015   Fibromyalgia syndrome 11/13/2015   Generalized anxiety disorder 11/13/2015   Chronic pain  syndrome 11/13/2015   Overactive detrusor 09/11/2015   Adrenal adenoma 09/02/2015   Chronic cystitis 09/02/2015   Incomplete bladder emptying 01/60/1093   Renal colic 23/55/7322   Gross hematuria 07/28/2015   History of reconstruction of both breasts 06/18/2015   Acquired absence of both breasts and nipples 11/20/2014   Transient cerebral ischemia 11/28/2013   Hemiplegia of dominant side ([place]) 11/20/2013    Orientation RESPIRATION BLADDER Height & Weight     Self  Normal Incontinent, External catheter Weight: 121 lb 7.6 oz (55.1 kg) Height:  '5\' 7"'$  (170.2 cm)  BEHAVIORAL SYMPTOMS/MOOD NEUROLOGICAL BOWEL NUTRITION STATUS      Continent Diet (see discharge summary)  AMBULATORY STATUS COMMUNICATION OF NEEDS Skin   Limited Assist Verbally Normal                       Personal Care Assistance Level of Assistance  Bathing, Dressing, Feeding, Total care Bathing Assistance: Limited assistance Feeding assistance: Independent Dressing Assistance: Limited assistance Total Care Assistance: Limited assistance   Functional Limitations Info  Sight, Speech, Hearing Sight Info: Impaired Hearing Info: Adequate Speech Info: Adequate    SPECIAL CARE FACTORS FREQUENCY  PT (By licensed PT), OT (By licensed OT)     PT Frequency: min 4x weekly OT Frequency: min 4x weekly            Contractures Contractures Info: Not present    Additional Factors Info  Code Status, Allergies Code Status Info: DNR Allergies Info: Codeine   Demerol (Meperidine)   Latex   Morphine And Related   Ciprofloxacin   Doxycycline  Flagyl (Metronidazole)   Fluconazole   Influenza Vaccines   Tape   Valtrex (Valacyclovir Hcl)   Duloxetine Hcl   Amoxicillin-pot Clavulanate   Sulfamethoxazole-trimethoprim   Valacyclovir           Current Medications (04/30/2022):  This is the current hospital active medication list Current Facility-Administered Medications  Medication Dose Route Frequency Provider Last Rate  Last Admin   acetaminophen (TYLENOL) tablet 650 mg  650 mg Oral Q6H PRN Oswald Hillock, RPH       ALPRAZolam Duanne Moron) tablet 1 mg  1 mg Oral BID Ivor Costa, MD   1 mg at 04/30/22 0835   alum & mag hydroxide-simeth (MAALOX/MYLANTA) 200-200-20 MG/5ML suspension 15 mL  15 mL Oral Q6H PRN Ivor Costa, MD       aspirin EC tablet 81 mg  81 mg Oral Daily Benita Gutter, RPH   81 mg at 04/30/22 4827   calcium carbonate (TUMS - dosed in mg elemental calcium) chewable tablet 200 mg of elemental calcium  1 tablet Oral TID PRN Ivor Costa, MD       cyclobenzaprine (FLEXERIL) tablet 10 mg  10 mg Oral TID PRN Ivor Costa, MD   10 mg at 04/28/22 2311   heparin injection 5,000 Units  5,000 Units Subcutaneous Q8H Wyvonnia Dusky, MD   5,000 Units at 04/30/22 1538   ondansetron (ZOFRAN) injection 4 mg  4 mg Intravenous Q8H PRN Ivor Costa, MD       oxyCODONE-acetaminophen (PERCOCET/ROXICET) 5-325 MG per tablet 1 tablet  1 tablet Oral Q6H PRN Ivor Costa, MD   1 tablet at 04/29/22 2127   rosuvastatin (CRESTOR) tablet 10 mg  10 mg Oral Daily Ivor Costa, MD   10 mg at 04/30/22 0835   zolpidem (AMBIEN) tablet 5 mg  5 mg Oral QHS Ivor Costa, MD   5 mg at 04/29/22 2115     Discharge Medications: Please see discharge summary for a list of discharge medications.  Relevant Imaging Results:  Relevant Lab Results:   Additional Information MBE:[id number]  Alberteen Sam, LCSW

## 2022-04-30 NOTE — Evaluation (Signed)
Occupational Therapy Evaluation Patient Details Name: ERINA HAMME MRN: [id number] DOB: 1945/12/14 Today's Date: 04/30/2022   History of Present Illness 76 y.o. female with medical history significant of hypertension, stroke, GERD, depression with anxiety, migraine headache, right breast cancer (s/p mastectomy), chronic pain syndrome, sCHF, CKD stage IIIa, who presents with AMS.   Clinical Impression   Patient presenting with decreased Ind in self care, balance, functional mobility/transfers, endurance, and safety awareness. Patient reports being Ind at baseline without use of AD and living alone. Pt appears to be using memory aides in the room to answer orientation questions related to location, time (clock), and date. However, pt does not know why she is in the hospital and reports that it is 1030 at night.  Patient currently functioning at min A overall with posterior bias and several LOB in room and in hallway. Patient will benefit from acute OT to increase overall independence in the areas of ADLs, functional mobility, and safety awareness in order to safely discharge to next venue of care.      Recommendations for follow up therapy are one component of a multi-disciplinary discharge planning process, led by the attending physician.  Recommendations may be updated based on patient status, additional functional criteria and insurance authorization.   Follow Up Recommendations  Skilled nursing-short term rehab (<3 hours/day)    Assistance Recommended at Discharge Frequent or constant Supervision/Assistance  Patient can return home with the following A little help with walking and/or transfers;A little help with bathing/dressing/bathroom;Direct supervision/assist for medications management;Assistance with cooking/housework;Direct supervision/assist for financial management;Help with stairs or ramp for entrance;Assist for transportation    Functional Status Assessment  Patient has had a  recent decline in their functional status and demonstrates the ability to make significant improvements in function in a reasonable and predictable amount of time.  Equipment Recommendations  Other (comment) (defer to next venue of care)       Precautions / Restrictions Precautions Precautions: Fall      Mobility Bed Mobility Overal bed mobility: Needs Assistance Bed Mobility: Supine to Sit, Sit to Supine     Supine to sit: Supervision Sit to supine: Min assist   General bed mobility comments: assist for B LEs when returning to bed    Transfers Overall transfer level: Needs assistance Equipment used: 1 person hand held assist Transfers: Sit to/from Stand, Bed to chair/wheelchair/BSC Sit to Stand: Min assist     Step pivot transfers: Min assist     General transfer comment: posterior bias in standing      Balance Overall balance assessment: Needs assistance Sitting-balance support: Feet supported Sitting balance-Leahy Scale: Good   Postural control: Posterior lean Standing balance support: During functional activity Standing balance-Leahy Scale: Poor                             ADL either performed or assessed with clinical judgement   ADL Overall ADL's : Needs assistance/impaired     Grooming: Wash/dry hands;Wash/dry face;Oral care;Sitting               Lower Body Dressing: Minimal assistance;Sit to/from stand                       Vision Patient Visual Report: No change from baseline              Pertinent Vitals/Pain Pain Assessment Pain Assessment: No/denies pain     Hand Dominance Right  Extremity/Trunk Assessment Upper Extremity Assessment Upper Extremity Assessment: Generalized weakness;Overall [facility] for tasks assessed   Lower Extremity Assessment Lower Extremity Assessment: Generalized weakness       Communication Communication Communication: No difficulties   Cognition Arousal/Alertness:  Awake/alert Behavior During Therapy: WFL for tasks assessed/performed Overall Cognitive Status: No family/caregiver present to determine baseline cognitive functioning                                 General Comments: Pt is good at using memory aides in room to reply to orientation questions. She looks at time on clock but then reports it is night time. She asks therapist why she is at the hospital.                [place] expects to be discharged to:: Private residence Living Arrangements: Alone   Type of Home: House Home Access: Stairs to enter [employer] of Steps: 3-4 Entrance Stairs-Rails: Right;Left Home Layout: One level     Bathroom Shower/Tub: Teacher, early years/pre: Standard     Home Equipment: Cane - quad;Cane - single point          Prior Functioning/Environment Prior Level of Function : Independent/Modified Independent             Mobility Comments: Pt reports ambulation without use of AD and driving ADLs Comments: Pt reports independence in all aspects of self care and IADLs. She likes to get into tub for bathing and reports no issues with doing so at baseline.        OT Problem List: Decreased strength;Decreased knowledge of use of DME or AE;Decreased activity tolerance;Cardiopulmonary status limiting activity;Impaired balance (sitting and/or standing);Decreased safety awareness      OT Treatment/Interventions: Self-care/ADL training;Therapeutic exercise;Patient/family education;Balance training;Energy conservation;Therapeutic activities;DME and/or AE instruction;Cognitive remediation/compensation    OT Goals(Current goals can be found in the care plan section) Acute Rehab OT Goals Patient Stated Goal: to go home OT Goal Formulation: With patient Time For Goal Achievement: 05/14/22 Potential to Achieve Goals: Fair  OT Frequency: Min 2X/week       AM-PAC OT "6 Clicks" Daily Activity      Outcome Measure Help from another person eating meals?: None Help from another person taking care of personal grooming?: A Little Help from another person toileting, which includes using toliet, bedpan, or urinal?: A Little Help from another person bathing (including washing, rinsing, drying)?: A Little Help from another person to put on and taking off regular upper body clothing?: A Little Help from another person to put on and taking off regular lower body clothing?: A Little 6 Click Score: 19   End of Session Nurse Communication: Mobility status  Activity Tolerance: Patient limited by fatigue Patient left: in bed;with call bell/phone within reach;with bed alarm set  OT Visit Diagnosis: Unsteadiness on feet (R26.81);Repeated falls (R29.6);Muscle weakness (generalized) (M62.81)                Time: 1829-9371 OT Time Calculation (min): 17 min Charges:  OT General Charges $OT Visit: 1 Visit OT Evaluation $OT Eval Moderate Complexity: 1 Mod OT Treatments $Self Care/Home Management : 8-22 mins  Darleen Crocker, MS, OTR/L , CBIS ascom [phone]  04/30/22, 1:36 PM

## 2022-04-30 NOTE — Evaluation (Signed)
Physical Therapy Evaluation Patient Details Name: Donna Evans MRN: [id number] DOB: 16-Jun-1946 Today's Date: 04/30/2022  History of Present Illness  76 y.o. female with medical history significant of hypertension, stroke, GERD, depression with anxiety, migraine headache, right breast cancer (s/p mastectomy), chronic pain syndrome, sCHF, CKD stage IIIa, who presents with AMS.  Clinical Impression  Patient received in bed wants to know why she is here against her will. Wants to go home. She is agreeable to PT assessment. Patient is mod independent with bed mobility, transfers with min guard. Ambulated with RW and min A 200 feet. Slow pace, easily distracted, cues for safe use of AD. She will continue to benefit from skilled PT while here to improve functional independence and safety. Patient would not be appropriate to return home alone at this time due to cognition and will need 24 hour assist or SNF upon discharge.          Recommendations for follow up therapy are one component of a multi-disciplinary discharge planning process, led by the attending physician.  Recommendations may be updated based on patient status, additional functional criteria and insurance authorization.  Follow Up Recommendations Skilled nursing-short term rehab (<3 hours/day)    Assistance Recommended at Discharge Frequent or constant Supervision/Assistance  Patient can return home with the following  A little help with walking and/or transfers;A little help with bathing/dressing/bathroom;Assist for transportation;Help with stairs or ramp for entrance;Assistance with cooking/housework    Equipment Recommendations Rolling walker (2 wheels)  Recommendations for Other Services       Functional Status Assessment Patient has had a recent decline in their functional status and demonstrates the ability to make significant improvements in function in a reasonable and predictable amount of time.     Precautions /  Restrictions Precautions Precautions: Fall Restrictions Weight Bearing Restrictions: No      Mobility  Bed Mobility Overal bed mobility: Modified Independent Bed Mobility: Supine to Sit, Sit to Supine     Supine to sit: Modified independent (Device/Increase time) Sit to supine: Modified independent (Device/Increase time)        Transfers Overall transfer level: Needs assistance Equipment used: Rolling walker (2 wheels) Transfers: Sit to/from Stand Sit to Stand: Min assist           General transfer comment: posterior bias in standing    Ambulation/Gait Ambulation/Gait assistance: Min assist Gait Distance (Feet): 150 Feet Assistive device: Rolling walker (2 wheels) Gait Pattern/deviations: Step-through pattern Gait velocity: decreased speed, easily distracted during mobility. Fatigued.     General Gait Details: patient requires min A for safety, tends to run into door frames and walls on occasion. Requiring cues to right self.  Stairs            Wheelchair Mobility    Modified Rankin (Stroke Patients Only)       Balance Overall balance assessment: Needs assistance Sitting-balance support: Feet supported Sitting balance-Leahy Scale: Good     Standing balance support: Bilateral upper extremity supported, During functional activity, Reliant on assistive device for balance Standing balance-Leahy Scale: Fair Standing balance comment: patient requires assist for safety with use of RW and min guard.                             Pertinent Vitals/Pain Pain Assessment Pain Assessment: No/denies pain    Home Living Family/patient expects to be discharged to:: Private residence Living Arrangements: Alone Available Help at Discharge: Family;Available PRN/intermittently Type of  Home: House Home Access: Stairs to enter Entrance Stairs-Rails: Psychiatric nurse of Steps: 3-4   Home Layout: One level Home Equipment: Shower  seat;Grab bars - toilet;Rollator (4 wheels);Cane - single point      Prior Function Prior Level of Function : Independent/Modified Independent             Mobility Comments: Pt reports ambulation without use of AD and driving ADLs Comments: Pt reports independence in all aspects of self care and IADLs. She likes to get into tub for bathing and reports no issues with doing so at baseline.     Hand Dominance   Dominant Hand: Right    Extremity/Trunk Assessment   Upper Extremity Assessment Upper Extremity Assessment: Defer to OT evaluation    Lower Extremity Assessment Lower Extremity Assessment: Generalized weakness    Cervical / Trunk Assessment Cervical / Trunk Assessment: Normal  Communication   Communication: No difficulties  Cognition Arousal/Alertness: Awake/alert Behavior During Therapy: WFL for tasks assessed/performed Overall Cognitive Status: No family/caregiver present to determine baseline cognitive functioning                                 General Comments: Patient states she wants to go home, feels like she is here involuntarily. Does not recall family visiting yesterday. Niece arrived at end of session.        General Comments      Exercises     Assessment/Plan    PT Assessment Patient needs continued PT services  PT Problem List Decreased strength;Decreased mobility;Decreased balance;Decreased safety awareness;Decreased knowledge of use of DME;Decreased cognition       PT Treatment Interventions DME instruction;Gait training;Therapeutic exercise;Balance training;Stair training;Functional mobility training;Therapeutic activities;Patient/family education;Cognitive remediation    PT Goals (Current goals can be found in the Care Plan section)  Acute Rehab PT Goals Patient Stated Goal: patient wants to go home PT Goal Formulation: With patient Time For Goal Achievement: 05/12/22 Potential to Achieve Goals: Fair    Frequency Min  2X/week     Co-evaluation               AM-PAC PT "6 Clicks" Mobility  Outcome Measure Help needed turning from your back to your side while in a flat bed without using bedrails?: A Little Help needed moving from lying on your back to sitting on the side of a flat bed without using bedrails?: A Little Help needed moving to and from a bed to a chair (including a wheelchair)?: A Little Help needed standing up from a chair using your arms (e.g., wheelchair or bedside chair)?: A Little Help needed to walk in hospital room?: A Little Help needed climbing 3-5 steps with a railing? : A Lot 6 Click Score: 17    End of Session Equipment Utilized During Treatment: Gait belt Activity Tolerance: Patient tolerated treatment well Patient left: in bed;with call bell/phone within reach;with bed alarm set;with family/visitor present Nurse Communication: Mobility status PT Visit Diagnosis: Unsteadiness on feet (R26.81);Difficulty in walking, not elsewhere classified (R26.2);Muscle weakness (generalized) (M62.81)    Time: 5427-0623 PT Time Calculation (min) (ACUTE ONLY): 20 min   Charges:   PT Evaluation $PT Eval Moderate Complexity: 1 Mod PT Treatments $Gait Training: 8-22 mins        Laporche Martelle, PT, GCS 04/30/22,12:36 PM

## 2022-04-30 NOTE — Care Management Important Message (Signed)
Important Message  Patient Details  Name: STEPHINIE BATTISTI MRN: [id number] Date of Birth: [birth year]   Medicare Important Message Given:  N/A - LOS <3 / Initial given by admissions     Dannette Barbara 04/30/2022, 8:23 AM

## 2022-04-30 NOTE — TOC Initial Note (Signed)
Transition of Care [facility]) - Initial/Assessment Note    Patient Details  Name: Donna Evans MRN: [id number] Date of Birth: 01-14-1946  Transition of [facility] CM/SW Contact:    Alberteen Sam, LCSW Phone Number: 04/30/2022, 3:32 PM  Clinical Narrative:                  CSW notes patient with AMS not fully oriented, CSW spoke with patient's sister Donna Evans regarding SNF recommendations, she reports being in agreement with no preference of facility however would like a local facility around [place].   CSW has sent out referrals pending bed offers at this time.   Expected Discharge Plan: Skilled Nursing Facility Barriers to Discharge: Continued Medical Work up   Patient Goals and CMS Choice Patient states their goals for this hospitalization and ongoing recovery are:: to go home CMS Medicare.gov Compare Post Acute Care list provided to:: Patient Represenative (must comment) (sister) Choice offered to / list presented to : Sibling  Expected Discharge Plan and Services Expected Discharge Plan: [place]       Living arrangements for the past 2 months: [place]                                      Prior Living Arrangements/Services Living arrangements for the past 2 months: Single Family Home Lives with:: Self                   Activities of Daily Living Home Assistive Devices/Equipment: Cane (specify quad or straight), Walker (specify type) ADL Screening (condition at time of admission) Patient's cognitive ability adequate to safely complete daily activities?: Yes Is the patient deaf or have difficulty hearing?: No Does the patient have difficulty seeing, even when wearing glasses/contacts?: No Does the patient have difficulty concentrating, remembering, or making decisions?: No Patient able to express need for assistance with ADLs?: Yes Does the patient have difficulty dressing or bathing?: Yes Independently performs ADLs?:  No Communication: Needs assistance Dressing (OT): Independent Grooming: Needs assistance Feeding: Independent Bathing: Independent Toileting: Needs assistance In/Out Bed: Needs assistance Walks in Home: Independent with device (comment) Does the patient have difficulty walking or climbing stairs?: Yes Weakness of Legs: Both Weakness of Arms/Hands: Both  Permission Sought/Granted                  Emotional Assessment       Orientation: : Fluctuating Orientation (Suspected and/or reported Sundowners)      Admission diagnosis:  Weakness [R53.1] Elevated troponin [R77.8] AKI (acute kidney injury) ([place]) [N17.9] Patient Active Problem List   Diagnosis Date Noted   Elevated troponin 04/28/2022   Stroke ([place]) 04/28/2022   Fall 04/28/2022   Acute renal failure superimposed on stage 3a chronic kidney disease ([place]) 04/28/2022   Chronic diastolic CHF (congestive heart failure) ([place]) 44/81/8563   Acute metabolic encephalopathy 14/97/0263   Long term current use of opiate analgesic 07/04/2017   Long term prescription opiate use 07/04/2017   Opiate use 07/04/2017   Bursitis of shoulder 07/04/2017   Closed fracture of upper end of humerus 07/04/2017   Knee pain 07/04/2017   Localized, primary osteoarthritis 07/04/2017   Osteoarthritis of knee 07/04/2017   Pain in limb 07/04/2017   Sprain of wrist 07/04/2017   Pain in joint involving ankle and foot 07/04/2017   Impingement syndrome of left shoulder region 06/29/2017   Trochanteric bursitis 06/29/2017  AKI (acute kidney injury) ([place]) 10/01/2016   Syncope 09/30/2016   Migraine headache 08/10/2016   Controlled substance agreement signed 06/12/2016   Insomnia 06/12/2016   Hypertension, benign 02/04/2016   Easy bruisability 11/13/2015   Osteoporosis 11/13/2015   Fibromyalgia syndrome 11/13/2015   Generalized anxiety disorder 11/13/2015   Chronic pain syndrome 11/13/2015   Overactive detrusor 09/11/2015   Adrenal adenoma  09/02/2015   Chronic cystitis 09/02/2015   Incomplete bladder emptying 79/02/8332   Renal colic 83/29/1916   Gross hematuria 07/28/2015   History of reconstruction of both breasts 06/18/2015   Acquired absence of both breasts and nipples 11/20/2014   Transient cerebral ischemia 11/28/2013   Hemiplegia of dominant side ([place]) 11/20/2013   PCP:  Jodi Marble, MD Pharmacy:   [place], [place] - [address] [place] [address] Phone: [phone] Fax: [phone]  [facility] [id number] [place] [address] [place] [address] Phone: [phone] Fax: [phone]  [id number] [place] [place]: [phone]: [phone]    Social Determinants of Health (SDOH) Interventions    Readmission Risk Interventions     View : No data to display.

## 2022-04-30 NOTE — Progress Notes (Signed)
[facility] Cardiology    SUBJECTIVE: Alert but still slightly confused cooperative has not ambulated on her own.  Feels better denies chest pain shortness of breath palpitations still somewhat hypotensive and may still require additional hydration   Vitals:   04/30/22 0315 04/30/22 0320 04/30/22 0325 04/30/22 0330  BP:   98/63   Pulse: 84 84 84 83  Resp: '15 14 14 14  '$ Temp:   (!) 97.4 F (36.3 C)   TempSrc:   Axillary   SpO2: 96% 98% 98% 98%  Weight:      Height:         Intake/Output Summary (Last 24 hours) at 04/30/2022 0751 Last data filed at 04/30/2022 0600 Gross per 24 hour  Intake 883.8 ml  Output 500 ml  Net 383.8 ml      PHYSICAL EXAM  General: Well developed, well nourished, in no acute distress HEENT:  Normocephalic and atramatic Neck:  No JVD.  Lungs: Clear bilaterally to auscultation and percussion. Heart: HRRR . Normal S1 and S2 without gallops or murmurs.  Abdomen: Bowel sounds are positive, abdomen soft and non-tender  Msk:  Back normal, normal gait. Normal strength and tone for age. Extremities: No clubbing, cyanosis or edema.   Neuro: Alert and oriented X 3. Psych:  Good affect, responds appropriately   LABS: Basic Metabolic Panel: Recent Labs    04/29/22 0504 04/30/22 0434  NA 142 141  K 4.1 3.8  CL 114* 113*  CO2 18* 21*  GLUCOSE 74 104*  BUN 67* 52*  CREATININE 2.04* 1.53*  CALCIUM 8.2* 8.4*   Liver Function Tests: Recent Labs    04/28/22 1220  AST 41  ALT 22  ALKPHOS 470*  BILITOT 0.4  PROT 7.8  ALBUMIN 3.9   No results for input(s): LIPASE, AMYLASE in the last 72 hours. CBC: Recent Labs    04/28/22 1220 04/29/22 0504 04/30/22 0434  WBC 10.4 7.4 5.9  NEUTROABS 7.6  --   --   HGB 14.9 12.4 11.3*  HCT 45.9 37.4 34.6*  MCV 97.0 96.4 95.3  PLT 336 218 201   Cardiac Enzymes: Recent Labs    04/28/22 1504  CKTOTAL 46   BNP: Invalid input(s): POCBNP D-Dimer: Recent Labs    04/28/22 1504  DDIMER 1.12*   Hemoglobin  A1C: Recent Labs    04/28/22 2145  HGBA1C 5.1   Fasting Lipid Panel: Recent Labs    04/29/22 0504  CHOL 111  HDL 42  LDLCALC 46  TRIG 114  CHOLHDL 2.6   Thyroid Function Tests: No results for input(s): TSH, T4TOTAL, T3FREE, THYROIDAB in the last 72 hours.  Invalid input(s): FREET3 Anemia Panel: No results for input(s): VITAMINB12, FOLATE, FERRITIN, TIBC, IRON, RETICCTPCT in the last 72 hours.  CT HEAD WO CONTRAST (5MM)  Result Date: 04/28/2022 CLINICAL DATA:  Head trauma, minor. EXAM: CT HEAD WITHOUT CONTRAST TECHNIQUE: Contiguous axial images were obtained from the base of the skull through the vertex without intravenous contrast. RADIATION DOSE REDUCTION: This exam was performed according to the departmental dose-optimization program which includes automated exposure control, adjustment of the mA and/or kV according to patient size and/or use of iterative reconstruction technique. COMPARISON:  Prior head CT examinations 12/15/2021 and earlier. Brain MRI 10/01/2016. FINDINGS: Brain: Mild generalized parenchymal atrophy. Mild patchy and ill-defined hypoattenuation within the cerebral white matter, nonspecific but compatible with chronic small vessel ischemic disease. There is no acute intracranial hemorrhage. No demarcated cortical infarct. No extra-axial fluid collection. No evidence of an  intracranial mass. No midline shift. Vascular: No hyperdense vessel.  Atherosclerotic calcifications. Skull: Normal. Negative for fracture or focal lesion. Sinuses/Orbits: No mass or acute finding within the imaged orbits. Moderate-sized fluid level within the right sphenoid sinus. Trace ankle thickening within the left maxillary sinus at the imaged levels IMPRESSION: No evidence of acute intracranial abnormality. Mild chronic small vessel ischemic changes within the cerebral white matter. Mild generalized parenchymal atrophy. Paranasal sinus disease at the imaged levels, as described. Electronically  Signed   By: Kellie Simmering D.O.   On: 04/28/2022 13:04   MR BRAIN WO CONTRAST  Result Date: 04/28/2022 CLINICAL DATA:  Altered mental status.  Confusion. EXAM: MRI HEAD WITHOUT CONTRAST TECHNIQUE: Multiplanar, multiecho pulse sequences of the brain and surrounding structures were obtained without intravenous contrast. COMPARISON:  09/30/2016 FINDINGS: Brain: No acute infarct, mass effect or extra-axial collection. No acute or chronic hemorrhage. There is multifocal hyperintense T2-weighted signal within the white matter. Generalized cerebral volume loss. The midline structures are normal. Vascular: Major flow voids are preserved. Skull and upper cervical spine: Normal calvarium and skull base. Visualized upper cervical spine and soft tissues are normal. Sinuses/Orbits:No paranasal sinus fluid levels or advanced mucosal thickening. No mastoid or middle ear effusion. Normal orbits. IMPRESSION: 1. No acute intracranial abnormality. 2. Findings of chronic microvascular ischemia and cerebral volume loss. Electronically Signed   By: Ulyses Jarred M.D.   On: 04/28/2022 20:59   NM Pulmonary Perfusion  Result Date: 04/29/2022 CLINICAL DATA:  Concern for pulmonary embolism.  Elevated D-dimer EXAM: NUCLEAR MEDICINE PERFUSION LUNG SCAN TECHNIQUE: Perfusion images were obtained in multiple projections after intravenous injection of radiopharmaceutical. RADIOPHARMACEUTICALS:  4.3 mCi Tc-17mMAA COMPARISON:  Radiograph 04/28/2022 FINDINGS: No wedge-shaped peripheral perfusion defect within LEFT or RIGHT lung to suggest acute pulmonary embolism. Small defect at the RIGHT lung base is favored atelectasis IMPRESSION: No evidence acute pulmonary embolism. Electronically Signed   By: SSuzy BouchardM.D.   On: 04/29/2022 09:35   [place]Venous Img Lower Bilateral (DVT)  Result Date: 04/29/2022 CLINICAL DATA:  Positive D-dimer.  Evaluate for DVT. EXAM: BILATERAL LOWER EXTREMITY VENOUS DOPPLER ULTRASOUND TECHNIQUE: Gray-scale  sonography with graded compression, as well as color Doppler and duplex ultrasound were performed to evaluate the lower extremity deep venous systems from the level of the common femoral vein and including the common femoral, femoral, profunda femoral, popliteal and calf veins including the posterior tibial, peroneal and gastrocnemius veins when visible. The superficial great saphenous vein was also interrogated. Spectral Doppler was utilized to evaluate flow at rest and with distal augmentation maneuvers in the common femoral, femoral and popliteal veins. COMPARISON:  None Available. FINDINGS: RIGHT LOWER EXTREMITY Common Femoral Vein: No evidence of thrombus. Normal compressibility, respiratory phasicity and response to augmentation. Saphenofemoral Junction: No evidence of thrombus. Normal compressibility and flow on color Doppler imaging. Profunda Femoral Vein: No evidence of thrombus. Normal compressibility and flow on color Doppler imaging. Femoral Vein: No evidence of thrombus. Normal compressibility, respiratory phasicity and response to augmentation. Popliteal Vein: No evidence of thrombus. Normal compressibility, respiratory phasicity and response to augmentation. Calf Veins: No evidence of thrombus. Normal compressibility and flow on color Doppler imaging. Superficial Great Saphenous Vein: No evidence of thrombus. Normal compressibility. Venous Reflux:  None. Other Findings:  None. LEFT LOWER EXTREMITY Common Femoral Vein: No evidence of thrombus. Normal compressibility, respiratory phasicity and response to augmentation. Saphenofemoral Junction: No evidence of thrombus. Normal compressibility and flow on color Doppler imaging. Profunda Femoral Vein: No evidence  of thrombus. Normal compressibility and flow on color Doppler imaging. Femoral Vein: No evidence of thrombus. Normal compressibility, respiratory phasicity and response to augmentation. Popliteal Vein: No evidence of thrombus. Normal  compressibility, respiratory phasicity and response to augmentation. Calf Veins: No evidence of thrombus. Normal compressibility and flow on color Doppler imaging. Superficial Great Saphenous Vein: No evidence of thrombus. Normal compressibility. Venous Reflux:  None. Other Findings:  None. IMPRESSION: No evidence of deep venous thrombosis in either lower extremity. Electronically Signed   By: Jacqulynn Cadet M.D.   On: 04/29/2022 10:04   DG Chest Port 1 View  Result Date: 04/28/2022 CLINICAL DATA:  Altered mental status, questionable sepsis, slurred speech, fall. EXAM: PORTABLE CHEST 1 VIEW COMPARISON:  12/18/2021. FINDINGS: Trachea is midline. Heart size normal. Biapical pleuroparenchymal scarring. Lungs are clear. No pleural fluid. Healed fractures of the posterior left seventh and eighth ribs. Healed left humeral neck fracture. IMPRESSION: No acute findings. Electronically Signed   By: Lorin Picket M.D.   On: 04/28/2022 13:13   ECHOCARDIOGRAM COMPLETE  Result Date: 04/29/2022    ECHOCARDIOGRAM REPORT   Patient Name:   ANESIA BLACKWELL Date of Exam: 04/29/2022 Medical Rec #:  [id number]      Height:       67.0 in Accession #:    [id number]     Weight:       134.7 lb Date of Birth:  1946-06-18       BSA:          1.710 m Patient Age:    76 years       BP:           139/76 mmHg Patient Gender: F              HR:           73 bpm. Exam Location:  [facility] Procedure: 2D Echo, Cardiac Doppler and Color Doppler Indications:     Syncope R55  History:         Patient has prior history of Echocardiogram examinations, most                  recent 10/01/2016. COPD; Risk Factors:Hypertension.  Sonographer:     Sherrie Sport Referring Phys:  [id number] Soledad Gerlach NIU Diagnosing Phys: Yolonda Kida MD  Sonographer Comments: Technically challenging study due to limited acoustic windows, no apical window and no subcostal window. Image acquisition challenging due to breast implants. IMPRESSIONS  1. Left ventricular ejection fraction,  by estimation, is 50 to 55%. The left ventricle has normal function. The left ventricle has no regional wall motion abnormalities. There is moderate concentric left ventricular hypertrophy. Left ventricular diastolic parameters are consistent with Grade I diastolic dysfunction (impaired relaxation).  2. Right ventricular systolic function is normal. The right ventricular size is normal.  3. The mitral valve is normal in structure. No evidence of mitral valve regurgitation.  4. Tricuspid valve regurgitation is mild to moderate.  5. The aortic valve is normal in structure. Aortic valve regurgitation is not visualized. FINDINGS  Left Ventricle: Left ventricular ejection fraction, by estimation, is 50 to 55%. The left ventricle has normal function. The left ventricle has no regional wall motion abnormalities. The left ventricular internal cavity size was normal in size. There is  moderate concentric left ventricular hypertrophy. Left ventricular diastolic parameters are consistent with Grade I diastolic dysfunction (impaired relaxation). Right Ventricle: The right ventricular size is normal. No increase in right ventricular wall thickness. Right ventricular  systolic function is normal. Left Atrium: Left atrial size was normal in size. Right Atrium: Right atrial size was normal in size. Pericardium: There is no evidence of pericardial effusion. Mitral Valve: The mitral valve is normal in structure. No evidence of mitral valve regurgitation. Tricuspid Valve: The tricuspid valve is normal in structure. Tricuspid valve regurgitation is mild to moderate. Aortic Valve: The aortic valve is normal in structure. Aortic valve regurgitation is not visualized. Pulmonic Valve: The pulmonic valve was normal in structure. Pulmonic valve regurgitation is not visualized. Aorta: The ascending aorta was not well visualized. IAS/Shunts: No atrial level shunt detected by color flow Doppler.  LEFT VENTRICLE PLAX 2D LVIDd:         2.30 cm  LVIDs:         1.70 cm LV PW:         1.60 cm LV IVS:        1.30 cm LVOT diam:     2.00 cm LVOT Area:     3.14 cm  LEFT ATRIUM         Index LA diam:    2.30 cm 1.35 cm/m                        PULMONIC VALVE AORTA                 PV Vmax:        1.05 m/s Ao Root diam: 3.33 cm PV Vmean:       80.800 cm/s                       PV VTI:         0.175 m                       PV Peak grad:   4.4 mmHg                       PV Mean grad:   3.0 mmHg                       RVOT Peak grad: 5 mmHg   SHUNTS Systemic Diam: 2.00 cm Pulmonic VTI:  0.211 m Tashe Purdon D Romain Erion MD Electronically signed by Yolonda Kida MD Signature Date/Time: 04/29/2022/5:07:03 PM    Final      Echo preserved left ventricular function EF 50 to 55%  TELEMETRY: Normal sinus rhythm rate of 80:  ASSESSMENT AND PLAN:  Principal Problem:   Elevated troponin Active Problems:   Generalized anxiety disorder   Hypertension, benign   Stroke [facility])   Fall   Acute renal failure superimposed on stage 3a chronic kidney disease (HCC)   Chronic diastolic CHF (congestive heart failure) (HCC)   Acute metabolic encephalopathy    Plan Demand ischemia verbalized troponin recommend conservative therapy Multiple falls unclear etiology unlikely cardiac Dehydration with acute on chronic renal insufficiency continue hydration therapy History of chronic diastolic congestive heart failure currently compensated Metabolic encephalopathy unclear etiology consider Serial neurology input History of CVA no residual deficits aspirin therapy statin therapy Generalized anxiety continue conservative management Physical therapy and Occupational Therapy for balance training to help prevent falls Continue to follow patient conservatively from a cardiac standpoint   Yolonda Kida, MD 04/30/2022 7:51 AM

## 2022-04-30 NOTE — Progress Notes (Signed)
PROGRESS NOTE    Donna Evans  GPQ:[id number] DOB: 09/22/1946 DOA: 04/28/2022 PCP: Jodi Marble, MD   Assessment & Plan:   Principal Problem:   Elevated troponin Active Problems:   Generalized anxiety disorder   Hypertension, benign   Stroke [facility])   Fall   Acute renal failure superimposed on stage 3a chronic kidney disease (HCC)   Chronic diastolic CHF (congestive heart failure) (HCC)   Acute metabolic encephalopathy  Assessment and Plan: Elevated troponin: likely secondary to demand ischemia. Continue on statin. V/Q scan neg for PE & [place] of b/l LE neg for DVT. Cardio following and recs apprec   Acute metabolic encephalopathy: etiology unclear, possible medication induced vs concern of alcohol abuse as per pt's sister. Pt was unable to keep track of which medications were taken when. Intermittently confused. CT head & MRI brain show no acute intracranial abnormalities    Chronic diastolic CHF: echo showed EF 60 to 65% with grade 1 diastolic dysfunction in 0940. CHF appears compensated. Holding torsemide    AKI on CKDIIIa: likely secondary to dehydration & use of diuretics. Cr continues to trend down daily. Continue to hold olmesartan, HCTZ & torsemide.    Fall: PT/OT consulted    Hx of CVA: continue on aspirin, statin    HTN: holding all anti-HTN meds as BP is low normal    Generalized anxiety disorder: severity unknown. Xanax prn      DVT prophylaxis: heparin sq Code Status: DNR Family Communication: discussed w/ pt's sister, Renee, and answered her questions  Disposition Plan: depends on PT/OT recs   Level of care: Progressive  Status is: Inpatient Remains inpatient appropriate because: severity of illness   Consultants:    Procedures:   Antimicrobials:   Subjective: Pt c/o fatigue   Objective: Vitals:   04/30/22 0315 04/30/22 0320 04/30/22 0325 04/30/22 0330  BP:   98/63   Pulse: 84 84 84 83  Resp: '15 14 14 14  '$ Temp:   (!) 97.4 F (36.3 C)    TempSrc:   Axillary   SpO2: 96% 98% 98% 98%  Weight:      Height:        Intake/Output Summary (Last 24 hours) at 04/30/2022 0733 Last data filed at 04/30/2022 0600 Gross per 24 hour  Intake 883.8 ml  Output 500 ml  Net 383.8 ml   Filed Weights   04/28/22 1220 04/28/22 2133  Weight: 62 kg 61.1 kg    Examination:  General exam: Appears comfortable Respiratory system: clear breath sounds b/l  Cardiovascular system: S1/S2+. No rubs or clicks  Gastrointestinal system: Abd is soft, NT, ND & hypoactive bowel sounds  Central nervous system: Alert and oriented to person, month & year only. Moves all extremities  Psychiatry: judgement and insight appears poor. Flat mood and affect     Data Reviewed: I have personally reviewed following labs and imaging studies  CBC: Recent Labs  Lab 04/28/22 1220 04/29/22 0504 04/30/22 0434  WBC 10.4 7.4 5.9  NEUTROABS 7.6  --   --   HGB 14.9 12.4 11.3*  HCT 45.9 37.4 34.6*  MCV 97.0 96.4 95.3  PLT 336 218 768   Basic Metabolic Panel: Recent Labs  Lab 04/28/22 1220 04/29/22 0504 04/30/22 0434  NA 138 142 141  K 4.5 4.1 3.8  CL 106 114* 113*  CO2 17* 18* 21*  GLUCOSE 141* 74 104*  BUN 76* 67* 52*  CREATININE 2.89* 2.04* 1.53*  CALCIUM 9.1 8.2* 8.4*  GFR: Estimated Creatinine Clearance: 30.2 mL/min (A) (by C-G formula based on SCr of 1.53 mg/dL (H)). Liver Function Tests: Recent Labs  Lab 04/28/22 1220  AST 41  ALT 22  ALKPHOS 470*  BILITOT 0.4  PROT 7.8  ALBUMIN 3.9   No results for input(s): LIPASE, AMYLASE in the last 168 hours. No results for input(s): AMMONIA in the last 168 hours. Coagulation Profile: Recent Labs  Lab 04/28/22 1504  INR 1.0   Cardiac Enzymes: Recent Labs  Lab 04/28/22 1504  CKTOTAL 46   BNP (last 3 results) No results for input(s): PROBNP in the last 8760 hours. HbA1C: Recent Labs    04/28/22 2145  HGBA1C 5.1   CBG: Recent Labs  Lab 04/29/22 1113  GLUCAP 72   Lipid  Profile: Recent Labs    04/29/22 0504  CHOL 111  HDL 42  LDLCALC 46  TRIG 114  CHOLHDL 2.6   Thyroid Function Tests: No results for input(s): TSH, T4TOTAL, FREET4, T3FREE, THYROIDAB in the last 72 hours. Anemia Panel: No results for input(s): VITAMINB12, FOLATE, FERRITIN, TIBC, IRON, RETICCTPCT in the last 72 hours. Sepsis Labs: Recent Labs  Lab 04/28/22 1220  LATICACIDVEN 1.8    No results found for this or any previous visit (from the past 240 hour(s)).       Radiology Studies: CT HEAD WO CONTRAST (5MM)  Result Date: 04/28/2022 CLINICAL DATA:  Head trauma, minor. EXAM: CT HEAD WITHOUT CONTRAST TECHNIQUE: Contiguous axial images were obtained from the base of the skull through the vertex without intravenous contrast. RADIATION DOSE REDUCTION: This exam was performed according to the departmental dose-optimization program which includes automated exposure control, adjustment of the mA and/or kV according to patient size and/or use of iterative reconstruction technique. COMPARISON:  Prior head CT examinations 12/15/2021 and earlier. Brain MRI 10/01/2016. FINDINGS: Brain: Mild generalized parenchymal atrophy. Mild patchy and ill-defined hypoattenuation within the cerebral white matter, nonspecific but compatible with chronic small vessel ischemic disease. There is no acute intracranial hemorrhage. No demarcated cortical infarct. No extra-axial fluid collection. No evidence of an intracranial mass. No midline shift. Vascular: No hyperdense vessel.  Atherosclerotic calcifications. Skull: Normal. Negative for fracture or focal lesion. Sinuses/Orbits: No mass or acute finding within the imaged orbits. Moderate-sized fluid level within the right sphenoid sinus. Trace ankle thickening within the left maxillary sinus at the imaged levels IMPRESSION: No evidence of acute intracranial abnormality. Mild chronic small vessel ischemic changes within the cerebral white matter. Mild generalized  parenchymal atrophy. Paranasal sinus disease at the imaged levels, as described. Electronically Signed   By: Kellie Simmering D.O.   On: 04/28/2022 13:04   MR BRAIN WO CONTRAST  Result Date: 04/28/2022 CLINICAL DATA:  Altered mental status.  Confusion. EXAM: MRI HEAD WITHOUT CONTRAST TECHNIQUE: Multiplanar, multiecho pulse sequences of the brain and surrounding structures were obtained without intravenous contrast. COMPARISON:  09/30/2016 FINDINGS: Brain: No acute infarct, mass effect or extra-axial collection. No acute or chronic hemorrhage. There is multifocal hyperintense T2-weighted signal within the white matter. Generalized cerebral volume loss. The midline structures are normal. Vascular: Major flow voids are preserved. Skull and upper cervical spine: Normal calvarium and skull base. Visualized upper cervical spine and soft tissues are normal. Sinuses/Orbits:No paranasal sinus fluid levels or advanced mucosal thickening. No mastoid or middle ear effusion. Normal orbits. IMPRESSION: 1. No acute intracranial abnormality. 2. Findings of chronic microvascular ischemia and cerebral volume loss. Electronically Signed   By: Ulyses Jarred M.D.   On: 04/28/2022 20:59  NM Pulmonary Perfusion  Result Date: 04/29/2022 CLINICAL DATA:  Concern for pulmonary embolism.  Elevated D-dimer EXAM: NUCLEAR MEDICINE PERFUSION LUNG SCAN TECHNIQUE: Perfusion images were obtained in multiple projections after intravenous injection of radiopharmaceutical. RADIOPHARMACEUTICALS:  4.3 mCi Tc-73mMAA COMPARISON:  Radiograph 04/28/2022 FINDINGS: No wedge-shaped peripheral perfusion defect within LEFT or RIGHT lung to suggest acute pulmonary embolism. Small defect at the RIGHT lung base is favored atelectasis IMPRESSION: No evidence acute pulmonary embolism. Electronically Signed   By: SSuzy BouchardM.D.   On: 04/29/2022 09:35   [place] Img Lower Bilateral (DVT)  Result Date: 04/29/2022 CLINICAL DATA:  Positive D-dimer.   Evaluate for DVT. EXAM: BILATERAL LOWER EXTREMITY VENOUS DOPPLER ULTRASOUND TECHNIQUE: Gray-scale sonography with graded compression, as well as color Doppler and duplex ultrasound were performed to evaluate the lower extremity deep venous systems from the level of the common femoral vein and including the common femoral, femoral, profunda femoral, popliteal and calf veins including the posterior tibial, peroneal and gastrocnemius veins when visible. The superficial great saphenous vein was also interrogated. Spectral Doppler was utilized to evaluate flow at rest and with distal augmentation maneuvers in the common femoral, femoral and popliteal veins. COMPARISON:  None Available. FINDINGS: RIGHT LOWER EXTREMITY Common Femoral Vein: No evidence of thrombus. Normal compressibility, respiratory phasicity and response to augmentation. Saphenofemoral Junction: No evidence of thrombus. Normal compressibility and flow on color Doppler imaging. Profunda Femoral Vein: No evidence of thrombus. Normal compressibility and flow on color Doppler imaging. Femoral Vein: No evidence of thrombus. Normal compressibility, respiratory phasicity and response to augmentation. Popliteal Vein: No evidence of thrombus. Normal compressibility, respiratory phasicity and response to augmentation. Calf Veins: No evidence of thrombus. Normal compressibility and flow on color Doppler imaging. Superficial Great Saphenous Vein: No evidence of thrombus. Normal compressibility. Venous Reflux:  None. Other Findings:  None. LEFT LOWER EXTREMITY Common Femoral Vein: No evidence of thrombus. Normal compressibility, respiratory phasicity and response to augmentation. Saphenofemoral Junction: No evidence of thrombus. Normal compressibility and flow on color Doppler imaging. Profunda Femoral Vein: No evidence of thrombus. Normal compressibility and flow on color Doppler imaging. Femoral Vein: No evidence of thrombus. Normal compressibility, respiratory  phasicity and response to augmentation. Popliteal Vein: No evidence of thrombus. Normal compressibility, respiratory phasicity and response to augmentation. Calf Veins: No evidence of thrombus. Normal compressibility and flow on color Doppler imaging. Superficial Great Saphenous Vein: No evidence of thrombus. Normal compressibility. Venous Reflux:  None. Other Findings:  None. IMPRESSION: No evidence of deep venous thrombosis in either lower extremity. Electronically Signed   By: HJacqulynn CadetM.D.   On: 04/29/2022 10:04   DG Chest Port 1 View  Result Date: 04/28/2022 CLINICAL DATA:  Altered mental status, questionable sepsis, slurred speech, fall. EXAM: PORTABLE CHEST 1 VIEW COMPARISON:  12/18/2021. FINDINGS: Trachea is midline. Heart size normal. Biapical pleuroparenchymal scarring. Lungs are clear. No pleural fluid. Healed fractures of the posterior left seventh and eighth ribs. Healed left humeral neck fracture. IMPRESSION: No acute findings. Electronically Signed   By: MLorin PicketM.D.   On: 04/28/2022 13:13   ECHOCARDIOGRAM COMPLETE  Result Date: 04/29/2022    ECHOCARDIOGRAM REPORT   Patient Name:   MYANET BALLIETDate of Exam: 04/29/2022 Medical Rec #:  [id number]     Height:       67.0 in Accession #:    [id number]    Weight:       134.7 lb Date of Birth:  [birth year]  BSA:          1.710 m Patient Age:    26 years       BP:           139/76 mmHg Patient Gender: F              HR:           73 bpm. Exam Location:  [facility] Procedure: 2D Echo, Cardiac Doppler and Color Doppler Indications:     Syncope R55  History:         Patient has prior history of Echocardiogram examinations, most                  recent 10/01/2016. COPD; Risk Factors:Hypertension.  Sonographer:     Sherrie Sport Referring Phys:  [id number] Soledad Gerlach NIU Diagnosing Phys: Yolonda Kida MD  Sonographer Comments: Technically challenging study due to limited acoustic windows, no apical window and no subcostal window. Image acquisition  challenging due to breast implants. IMPRESSIONS  1. Left ventricular ejection fraction, by estimation, is 50 to 55%. The left ventricle has normal function. The left ventricle has no regional wall motion abnormalities. There is moderate concentric left ventricular hypertrophy. Left ventricular diastolic parameters are consistent with Grade I diastolic dysfunction (impaired relaxation).  2. Right ventricular systolic function is normal. The right ventricular size is normal.  3. The mitral valve is normal in structure. No evidence of mitral valve regurgitation.  4. Tricuspid valve regurgitation is mild to moderate.  5. The aortic valve is normal in structure. Aortic valve regurgitation is not visualized. FINDINGS  Left Ventricle: Left ventricular ejection fraction, by estimation, is 50 to 55%. The left ventricle has normal function. The left ventricle has no regional wall motion abnormalities. The left ventricular internal cavity size was normal in size. There is  moderate concentric left ventricular hypertrophy. Left ventricular diastolic parameters are consistent with Grade I diastolic dysfunction (impaired relaxation). Right Ventricle: The right ventricular size is normal. No increase in right ventricular wall thickness. Right ventricular systolic function is normal. Left Atrium: Left atrial size was normal in size. Right Atrium: Right atrial size was normal in size. Pericardium: There is no evidence of pericardial effusion. Mitral Valve: The mitral valve is normal in structure. No evidence of mitral valve regurgitation. Tricuspid Valve: The tricuspid valve is normal in structure. Tricuspid valve regurgitation is mild to moderate. Aortic Valve: The aortic valve is normal in structure. Aortic valve regurgitation is not visualized. Pulmonic Valve: The pulmonic valve was normal in structure. Pulmonic valve regurgitation is not visualized. Aorta: The ascending aorta was not well visualized. IAS/Shunts: No atrial level  shunt detected by color flow Doppler.  LEFT VENTRICLE PLAX 2D LVIDd:         2.30 cm LVIDs:         1.70 cm LV PW:         1.60 cm LV IVS:        1.30 cm LVOT diam:     2.00 cm LVOT Area:     3.14 cm  LEFT ATRIUM         Index LA diam:    2.30 cm 1.35 cm/m                        PULMONIC VALVE AORTA                 PV Vmax:        1.05 m/s  Ao Root diam: 3.33 cm PV Vmean:       80.800 cm/s                       PV VTI:         0.175 m                       PV Peak grad:   4.4 mmHg                       PV Mean grad:   3.0 mmHg                       RVOT Peak grad: 5 mmHg   SHUNTS Systemic Diam: 2.00 cm Pulmonic VTI:  0.211 m Dwayne D Callwood MD Electronically signed by Yolonda Kida MD Signature Date/Time: 04/29/2022/5:07:03 PM    Final         Scheduled Meds:  ALPRAZolam  1 mg Oral BID   aspirin EC  81 mg Oral Daily   heparin injection (subcutaneous)  5,000 Units Subcutaneous Q8H   rosuvastatin  10 mg Oral Daily   zolpidem  5 mg Oral QHS   Continuous Infusions:     LOS: 2 days    Time spent: 33 mins     Wyvonnia Dusky, MD [facility] Pager [phone]  If 7PM-7AM, please contact night-coverage 04/30/2022, 7:33 AM

## 2022-05-01 DIAGNOSIS — N179 Acute kidney failure, unspecified: Secondary | ICD-10-CM | POA: Diagnosis not present

## 2022-05-01 DIAGNOSIS — G9341 Metabolic encephalopathy: Secondary | ICD-10-CM | POA: Diagnosis not present

## 2022-05-01 DIAGNOSIS — R778 Other specified abnormalities of plasma proteins: Secondary | ICD-10-CM | POA: Diagnosis not present

## 2022-05-01 DIAGNOSIS — N1831 Chronic kidney disease, stage 3a: Secondary | ICD-10-CM | POA: Diagnosis not present

## 2022-05-01 LAB — CBC
HCT: 39.9 % (ref 36.0–46.0)
Hemoglobin: 12.9 g/dL (ref 12.0–15.0)
MCH: 30.7 pg (ref 26.0–34.0)
MCHC: 32.3 g/dL (ref 30.0–36.0)
MCV: 95 fL (ref 80.0–100.0)
Platelets: 205 10*3/uL (ref 150–400)
RBC: 4.2 MIL/uL (ref 3.87–5.11)
RDW: 14 % (ref 11.5–15.5)
WBC: 6.7 10*3/uL (ref 4.0–10.5)
nRBC: 0 % (ref 0.0–0.2)

## 2022-05-01 LAB — BASIC METABOLIC PANEL
Anion gap: 8 (ref 5–15)
BUN: 42 mg/dL — ABNORMAL HIGH (ref 8–23)
CO2: 23 mmol/L (ref 22–32)
Calcium: 8.7 mg/dL — ABNORMAL LOW (ref 8.9–10.3)
Chloride: 108 mmol/L (ref 98–111)
Creatinine, Ser: 1.44 mg/dL — ABNORMAL HIGH (ref 0.44–1.00)
GFR, Estimated: 38 mL/min — ABNORMAL LOW (ref >60)
Glucose, Bld: 108 mg/dL — ABNORMAL HIGH (ref 70–99)
Potassium: 3.7 mmol/L (ref 3.5–5.1)
Sodium: 139 mmol/L (ref 135–145)

## 2022-05-01 MED ORDER — MIDODRINE HCL 5 MG PO TABS
2.5000 mg | ORAL_TABLET | Freq: Three times a day (TID) | ORAL | Status: DC
  Administered 2022-05-01 – 2022-05-04 (×11): 2.5 mg via ORAL
  Filled 2022-05-01 (×10): qty 1

## 2022-05-01 NOTE — Progress Notes (Signed)
   05/01/22 0100  Clinical Encounter Type  Visited With Patient  Visit Type Initial  Referral From Chaplain  Consult/Referral To [place] visited with patient as she spoke about hospital stay. Chaplain provided compassionate presence and reflective listening. Patient was also bleeding from wound on hand. Chaplain informed nurse. Patient appreciated [place] visit.

## 2022-05-01 NOTE — Progress Notes (Signed)
PROGRESS NOTE    Donna Evans  VXY:[id number] DOB: June 18, 1946 DOA: 04/28/2022 PCP: Jodi Marble, MD   Assessment & Plan:   Principal Problem:   Elevated troponin Active Problems:   Generalized anxiety disorder   Hypertension, benign   Stroke [facility])   Fall   Acute renal failure superimposed on stage 3a chronic kidney disease (HCC)   Chronic diastolic CHF (congestive heart failure) (HCC)   Acute metabolic encephalopathy  Assessment and Plan: Elevated troponin: likely secondary to demand ischemia. Continue on statin. V/Q scan neg for PE & [place] of b/l LE neg for DVT. Cardio recs apprec. Will need f/u w/ cardio in 1-2 weeks   Acute metabolic encephalopathy: etiology unclear, possible medication induced vs concern of alcohol abuse as per pt's sister. Pt was unable to keep track of which medications were taken when. CT head & MRI brain show no acute intracranial abnormalities. Answering questions appropriately today    Chronic diastolic CHF: echo showed EF 60 to 65% with grade 1 diastolic dysfunction in 8270. Hold torsemide. CHF appears compensated   AKI on CKDIIIa: likely secondary to dehydration & use of diuretics. Cr is trending down daily. Hold olmesartan, torsemide & HCTZ    Fall: PT/OT recs SNF. Pt wants to think over SNF and discuss w/ pt's sister    Hx of CVA: continue on stain, aspirin     HTN: continue to hold all anti-HTN meds as BP is low normal   Generalized anxiety disorder: severity unknown. Xanax prn      DVT prophylaxis: heparin sq Code Status: DNR Family Communication: called pt's sister, Renee, no answer so I left a voicemail  Disposition Plan: possibly d/c to SNF    Level of care: Progressive  Status is: Inpatient Remains inpatient appropriate because: severity of illness   Consultants:    Procedures:   Antimicrobials:   Subjective: Pt c/o malaise   Objective: Vitals:   05/01/22 0000 05/01/22 0200 05/01/22 0400 05/01/22 0543  BP: 93/60  (!) 96/57 90/62   Pulse: 89 85 83   Resp: 17 16    Temp:      TempSrc:      SpO2: 98% 98% 97%   Weight:    56.3 kg  Height:        Intake/Output Summary (Last 24 hours) at 05/01/2022 0735 Last data filed at 04/30/2022 2000 Gross per 24 hour  Intake 480 ml  Output 200 ml  Net 280 ml   Filed Weights   04/28/22 2133 04/30/22 0500 05/01/22 0543  Weight: 61.1 kg 55.1 kg 56.3 kg    Examination:  General exam: Appears calm & comfortable  Respiratory system: clear breath sounds b/l Cardiovascular system: S1 &S2+. No rubs or clicks  Gastrointestinal system: Abd is soft, NT, ND & hypoactive bowel sounds  Central nervous system: Alert and oriented. Moves all extremities  Psychiatry: judgement and insight appears at baseline. Flat mood and affect     Data Reviewed: I have personally reviewed following labs and imaging studies  CBC: Recent Labs  Lab 04/28/22 1220 04/29/22 0504 04/30/22 0434 05/01/22 0437  WBC 10.4 7.4 5.9 6.7  NEUTROABS 7.6  --   --   --   HGB 14.9 12.4 11.3* 12.9  HCT 45.9 37.4 34.6* 39.9  MCV 97.0 96.4 95.3 95.0  PLT 336 218 201 786   Basic Metabolic Panel: Recent Labs  Lab 04/28/22 1220 04/29/22 0504 04/30/22 0434 05/01/22 0437  NA 138 142 141 139  K  4.5 4.1 3.8 3.7  CL 106 114* 113* 108  CO2 17* 18* 21* 23  GLUCOSE 141* 74 104* 108*  BUN 76* 67* 52* 42*  CREATININE 2.89* 2.04* 1.53* 1.44*  CALCIUM 9.1 8.2* 8.4* 8.7*   GFR: Estimated Creatinine Clearance: 29.5 mL/min (A) (by C-G formula based on SCr of 1.44 mg/dL (H)). Liver Function Tests: Recent Labs  Lab 04/28/22 1220  AST 41  ALT 22  ALKPHOS 470*  BILITOT 0.4  PROT 7.8  ALBUMIN 3.9   No results for input(s): LIPASE, AMYLASE in the last 168 hours. No results for input(s): AMMONIA in the last 168 hours. Coagulation Profile: Recent Labs  Lab 04/28/22 1504  INR 1.0   Cardiac Enzymes: Recent Labs  Lab 04/28/22 1504  CKTOTAL 46   BNP (last 3 results) No results for  input(s): PROBNP in the last 8760 hours. HbA1C: Recent Labs    04/28/22 2145  HGBA1C 5.1   CBG: Recent Labs  Lab 04/29/22 1113 04/30/22 0758  GLUCAP 72 113*   Lipid Profile: Recent Labs    04/29/22 0504  CHOL 111  HDL 42  LDLCALC 46  TRIG 114  CHOLHDL 2.6   Thyroid Function Tests: No results for input(s): TSH, T4TOTAL, FREET4, T3FREE, THYROIDAB in the last 72 hours. Anemia Panel: No results for input(s): VITAMINB12, FOLATE, FERRITIN, TIBC, IRON, RETICCTPCT in the last 72 hours. Sepsis Labs: Recent Labs  Lab 04/28/22 1220  LATICACIDVEN 1.8    No results found for this or any previous visit (from the past 240 hour(s)).       Radiology Studies: NM Pulmonary Perfusion  Result Date: 04/29/2022 CLINICAL DATA:  Concern for pulmonary embolism.  Elevated D-dimer EXAM: NUCLEAR MEDICINE PERFUSION LUNG SCAN TECHNIQUE: Perfusion images were obtained in multiple projections after intravenous injection of radiopharmaceutical. RADIOPHARMACEUTICALS:  4.3 mCi Tc-39mMAA COMPARISON:  Radiograph 04/28/2022 FINDINGS: No wedge-shaped peripheral perfusion defect within LEFT or RIGHT lung to suggest acute pulmonary embolism. Small defect at the RIGHT lung base is favored atelectasis IMPRESSION: No evidence acute pulmonary embolism. Electronically Signed   By: SSuzy BouchardM.D.   On: 04/29/2022 09:35   [place]Venous Img Lower Bilateral (DVT)  Result Date: 04/29/2022 CLINICAL DATA:  Positive D-dimer.  Evaluate for DVT. EXAM: BILATERAL LOWER EXTREMITY VENOUS DOPPLER ULTRASOUND TECHNIQUE: Gray-scale sonography with graded compression, as well as color Doppler and duplex ultrasound were performed to evaluate the lower extremity deep venous systems from the level of the common femoral vein and including the common femoral, femoral, profunda femoral, popliteal and calf veins including the posterior tibial, peroneal and gastrocnemius veins when visible. The superficial great saphenous vein was also  interrogated. Spectral Doppler was utilized to evaluate flow at rest and with distal augmentation maneuvers in the common femoral, femoral and popliteal veins. COMPARISON:  None Available. FINDINGS: RIGHT LOWER EXTREMITY Common Femoral Vein: No evidence of thrombus. Normal compressibility, respiratory phasicity and response to augmentation. Saphenofemoral Junction: No evidence of thrombus. Normal compressibility and flow on color Doppler imaging. Profunda Femoral Vein: No evidence of thrombus. Normal compressibility and flow on color Doppler imaging. Femoral Vein: No evidence of thrombus. Normal compressibility, respiratory phasicity and response to augmentation. Popliteal Vein: No evidence of thrombus. Normal compressibility, respiratory phasicity and response to augmentation. Calf Veins: No evidence of thrombus. Normal compressibility and flow on color Doppler imaging. Superficial Great Saphenous Vein: No evidence of thrombus. Normal compressibility. Venous Reflux:  None. Other Findings:  None. LEFT LOWER EXTREMITY Common Femoral Vein: No evidence of  thrombus. Normal compressibility, respiratory phasicity and response to augmentation. Saphenofemoral Junction: No evidence of thrombus. Normal compressibility and flow on color Doppler imaging. Profunda Femoral Vein: No evidence of thrombus. Normal compressibility and flow on color Doppler imaging. Femoral Vein: No evidence of thrombus. Normal compressibility, respiratory phasicity and response to augmentation. Popliteal Vein: No evidence of thrombus. Normal compressibility, respiratory phasicity and response to augmentation. Calf Veins: No evidence of thrombus. Normal compressibility and flow on color Doppler imaging. Superficial Great Saphenous Vein: No evidence of thrombus. Normal compressibility. Venous Reflux:  None. Other Findings:  None. IMPRESSION: No evidence of deep venous thrombosis in either lower extremity. Electronically Signed   By: Jacqulynn Cadet  M.D.   On: 04/29/2022 10:04   ECHOCARDIOGRAM COMPLETE  Result Date: 04/29/2022    ECHOCARDIOGRAM REPORT   Patient Name:   Donna Evans Date of Exam: 04/29/2022 Medical Rec #:  [id number]      Height:       67.0 in Accession #:    [id number]     Weight:       134.7 lb Date of Birth:  Sep 10, 1946       BSA:          1.710 m Patient Age:    24 years       BP:           139/76 mmHg Patient Gender: F              HR:           73 bpm. Exam Location:  [facility] Procedure: 2D Echo, Cardiac Doppler and Color Doppler Indications:     Syncope R55  History:         Patient has prior history of Echocardiogram examinations, most                  recent 10/01/2016. COPD; Risk Factors:Hypertension.  Sonographer:     Sherrie Sport Referring Phys:  [id number] Soledad Gerlach NIU Diagnosing Phys: Yolonda Kida MD  Sonographer Comments: Technically challenging study due to limited acoustic windows, no apical window and no subcostal window. Image acquisition challenging due to breast implants. IMPRESSIONS  1. Left ventricular ejection fraction, by estimation, is 50 to 55%. The left ventricle has normal function. The left ventricle has no regional wall motion abnormalities. There is moderate concentric left ventricular hypertrophy. Left ventricular diastolic parameters are consistent with Grade I diastolic dysfunction (impaired relaxation).  2. Right ventricular systolic function is normal. The right ventricular size is normal.  3. The mitral valve is normal in structure. No evidence of mitral valve regurgitation.  4. Tricuspid valve regurgitation is mild to moderate.  5. The aortic valve is normal in structure. Aortic valve regurgitation is not visualized. FINDINGS  Left Ventricle: Left ventricular ejection fraction, by estimation, is 50 to 55%. The left ventricle has normal function. The left ventricle has no regional wall motion abnormalities. The left ventricular internal cavity size was normal in size. There is  moderate concentric left ventricular  hypertrophy. Left ventricular diastolic parameters are consistent with Grade I diastolic dysfunction (impaired relaxation). Right Ventricle: The right ventricular size is normal. No increase in right ventricular wall thickness. Right ventricular systolic function is normal. Left Atrium: Left atrial size was normal in size. Right Atrium: Right atrial size was normal in size. Pericardium: There is no evidence of pericardial effusion. Mitral Valve: The mitral valve is normal in structure. No evidence of mitral valve regurgitation. Tricuspid Valve: The tricuspid valve is  normal in structure. Tricuspid valve regurgitation is mild to moderate. Aortic Valve: The aortic valve is normal in structure. Aortic valve regurgitation is not visualized. Pulmonic Valve: The pulmonic valve was normal in structure. Pulmonic valve regurgitation is not visualized. Aorta: The ascending aorta was not well visualized. IAS/Shunts: No atrial level shunt detected by color flow Doppler.  LEFT VENTRICLE PLAX 2D LVIDd:         2.30 cm LVIDs:         1.70 cm LV PW:         1.60 cm LV IVS:        1.30 cm LVOT diam:     2.00 cm LVOT Area:     3.14 cm  LEFT ATRIUM         Index LA diam:    2.30 cm 1.35 cm/m                        PULMONIC VALVE AORTA                 PV Vmax:        1.05 m/s Ao Root diam: 3.33 cm PV Vmean:       80.800 cm/s                       PV VTI:         0.175 m                       PV Peak grad:   4.4 mmHg                       PV Mean grad:   3.0 mmHg                       RVOT Peak grad: 5 mmHg   SHUNTS Systemic Diam: 2.00 cm Pulmonic VTI:  0.211 m Dwayne D Callwood MD Electronically signed by Yolonda Kida MD Signature Date/Time: 04/29/2022/5:07:03 PM    Final         Scheduled Meds:  ALPRAZolam  1 mg Oral BID   aspirin EC  81 mg Oral Daily   EPINEPHrine       heparin injection (subcutaneous)  5,000 Units Subcutaneous Q8H   rosuvastatin  10 mg Oral Daily   sodium chloride flush  3 mL Intravenous Q12H    zolpidem  5 mg Oral QHS   Continuous Infusions:     LOS: 3 days    Time spent: 30 mins     Wyvonnia Dusky, MD [facility] Pager [phone]  If 7PM-7AM, please contact night-coverage 05/01/2022, 7:35 AM

## 2022-05-01 NOTE — Progress Notes (Addendum)
[facility]    SUBJECTIVE: States he feels reasonably well did not sleep last night she has trouble with insomnia still has generalized back pain and right rib pain from previous falls has trouble getting comfortable in bed denies any chest pain denies any vertigo lightheadedness states she ambulated yesterday with assistance and did reasonably well feels well enough to be discharged home and plans to have some help with her over the next several days to a week   Vitals:   05/01/22 0000 05/01/22 0200 05/01/22 0400 05/01/22 0543  BP: 93/60 (!) 96/57 90/62   Pulse: 89 85 83   Resp: 17 16    Temp:      TempSrc:      SpO2: 98% 98% 97%   Weight:    56.3 kg  Height:         Intake/Output Summary (Last 24 hours) at 05/01/2022 0706 Last data filed at 04/30/2022 2000 Gross per 24 hour  Intake 480 ml  Output 200 ml  Net 280 ml      PHYSICAL EXAM  General: Well developed, well nourished, in no acute distress HEENT:  Normocephalic and atramatic Neck:  No JVD.  Lungs: Clear bilaterally to auscultation and percussion. Heart: HRRR . Normal S1 and S2 without gallops or murmurs.  Abdomen: Bowel sounds are positive, abdomen soft and non-tender  Msk:  Back normal, normal gait. Normal strength and tone for age. Extremities: No clubbing, cyanosis or edema.   Neuro: Alert and oriented X 3. Psych:  Good affect, responds appropriately   LABS: Basic Metabolic Panel: Recent Labs    04/30/22 0434 05/01/22 0437  NA 141 139  K 3.8 3.7  CL 113* 108  CO2 21* 23  GLUCOSE 104* 108*  BUN 52* 42*  CREATININE 1.53* 1.44*  CALCIUM 8.4* 8.7*   Liver Function Tests: Recent Labs    04/28/22 1220  AST 41  ALT 22  ALKPHOS 470*  BILITOT 0.4  PROT 7.8  ALBUMIN 3.9   No results for input(s): LIPASE, AMYLASE in the last 72 hours. CBC: Recent Labs    04/28/22 1220 04/29/22 0504 04/30/22 0434 05/01/22 0437  WBC 10.4   < > 5.9 6.7  NEUTROABS 7.6  --   --   --   HGB 14.9   < > 11.3*  12.9  HCT 45.9   < > 34.6* 39.9  MCV 97.0   < > 95.3 95.0  PLT 336   < > 201 205   < > = values in this interval not displayed.   Cardiac Enzymes: Recent Labs    04/28/22 1504  CKTOTAL 46   BNP: Invalid input(s): POCBNP D-Dimer: Recent Labs    04/28/22 1504  DDIMER 1.12*   Hemoglobin A1C: Recent Labs    04/28/22 2145  HGBA1C 5.1   Fasting Lipid Panel: Recent Labs    04/29/22 0504  CHOL 111  HDL 42  LDLCALC 46  TRIG 114  CHOLHDL 2.6   Thyroid Function Tests: No results for input(s): TSH, T4TOTAL, T3FREE, THYROIDAB in the last 72 hours.  Invalid input(s): FREET3 Anemia Panel: No results for input(s): VITAMINB12, FOLATE, FERRITIN, TIBC, IRON, RETICCTPCT in the last 72 hours.  NM Pulmonary Perfusion  Result Date: 04/29/2022 CLINICAL DATA:  Concern for pulmonary embolism.  Elevated D-dimer EXAM: NUCLEAR MEDICINE PERFUSION LUNG SCAN TECHNIQUE: Perfusion images were obtained in multiple projections after intravenous injection of radiopharmaceutical. RADIOPHARMACEUTICALS:  4.3 mCi Tc-4mMAA COMPARISON:  Radiograph 04/28/2022 FINDINGS: No wedge-shaped peripheral  perfusion defect within LEFT or RIGHT lung to suggest acute pulmonary embolism. Small defect at the RIGHT lung base is favored atelectasis IMPRESSION: No evidence acute pulmonary embolism. Electronically Signed   By: Suzy Bouchard M.D.   On: 04/29/2022 09:35   US Venous Img Lower Bilateral (DVT)  Result Date: 04/29/2022 CLINICAL DATA:  Positive D-dimer.  Evaluate for DVT. EXAM: BILATERAL LOWER EXTREMITY VENOUS DOPPLER ULTRASOUND TECHNIQUE: Gray-scale sonography with graded compression, as well as color Doppler and duplex ultrasound were performed to evaluate the lower extremity deep venous systems from the level of the common femoral vein and including the common femoral, femoral, profunda femoral, popliteal and calf veins including the posterior tibial, peroneal and gastrocnemius veins when visible. The  superficial great saphenous vein was also interrogated. Spectral Doppler was utilized to evaluate flow at rest and with distal augmentation maneuvers in the common femoral, femoral and popliteal veins. COMPARISON:  None Available. FINDINGS: RIGHT LOWER EXTREMITY Common Femoral Vein: No evidence of thrombus. Normal compressibility, respiratory phasicity and response to augmentation. Saphenofemoral Junction: No evidence of thrombus. Normal compressibility and flow on color Doppler imaging. Profunda Femoral Vein: No evidence of thrombus. Normal compressibility and flow on color Doppler imaging. Femoral Vein: No evidence of thrombus. Normal compressibility, respiratory phasicity and response to augmentation. Popliteal Vein: No evidence of thrombus. Normal compressibility, respiratory phasicity and response to augmentation. Calf Veins: No evidence of thrombus. Normal compressibility and flow on color Doppler imaging. Superficial Great Saphenous Vein: No evidence of thrombus. Normal compressibility. Venous Reflux:  None. Other Findings:  None. LEFT LOWER EXTREMITY Common Femoral Vein: No evidence of thrombus. Normal compressibility, respiratory phasicity and response to augmentation. Saphenofemoral Junction: No evidence of thrombus. Normal compressibility and flow on color Doppler imaging. Profunda Femoral Vein: No evidence of thrombus. Normal compressibility and flow on color Doppler imaging. Femoral Vein: No evidence of thrombus. Normal compressibility, respiratory phasicity and response to augmentation. Popliteal Vein: No evidence of thrombus. Normal compressibility, respiratory phasicity and response to augmentation. Calf Veins: No evidence of thrombus. Normal compressibility and flow on color Doppler imaging. Superficial Great Saphenous Vein: No evidence of thrombus. Normal compressibility. Venous Reflux:  None. Other Findings:  None. IMPRESSION: No evidence of deep venous thrombosis in either lower extremity.  Electronically Signed   By: Jacqulynn Cadet M.D.   On: 04/29/2022 10:04   ECHOCARDIOGRAM COMPLETE  Result Date: 04/29/2022    ECHOCARDIOGRAM REPORT   Patient Name:   EVIANA SIBILIA Date of Exam: 04/29/2022 Medical Rec #:  [id number]      Height:       67.0 in Accession #:    [id number]     Weight:       134.7 lb Date of Birth:  December 14, 1945       BSA:          1.710 m Patient Age:    50 years       BP:           139/76 mmHg Patient Gender: F              HR:           73 bpm. Exam Location:  [facility] Procedure: 2D Echo, Cardiac Doppler and Color Doppler Indications:     Syncope R55  History:         Patient has prior history of Echocardiogram examinations, most                  recent 10/01/2016. COPD; Risk  Factors:Hypertension.  Sonographer:     Sherrie Sport Referring Phys:  [id number] Soledad Gerlach NIU Diagnosing Phys: Yolonda Kida MD  Sonographer Comments: Technically challenging study due to limited acoustic windows, no apical window and no subcostal window. Image acquisition challenging due to breast implants. IMPRESSIONS  1. Left ventricular ejection fraction, by estimation, is 50 to 55%. The left ventricle has normal function. The left ventricle has no regional wall motion abnormalities. There is moderate concentric left ventricular hypertrophy. Left ventricular diastolic parameters are consistent with Grade I diastolic dysfunction (impaired relaxation).  2. Right ventricular systolic function is normal. The right ventricular size is normal.  3. The mitral valve is normal in structure. No evidence of mitral valve regurgitation.  4. Tricuspid valve regurgitation is mild to moderate.  5. The aortic valve is normal in structure. Aortic valve regurgitation is not visualized. FINDINGS  Left Ventricle: Left ventricular ejection fraction, by estimation, is 50 to 55%. The left ventricle has normal function. The left ventricle has no regional wall motion abnormalities. The left ventricular internal cavity size was normal in size.  There is  moderate concentric left ventricular hypertrophy. Left ventricular diastolic parameters are consistent with Grade I diastolic dysfunction (impaired relaxation). Right Ventricle: The right ventricular size is normal. No increase in right ventricular wall thickness. Right ventricular systolic function is normal. Left Atrium: Left atrial size was normal in size. Right Atrium: Right atrial size was normal in size. Pericardium: There is no evidence of pericardial effusion. Mitral Valve: The mitral valve is normal in structure. No evidence of mitral valve regurgitation. Tricuspid Valve: The tricuspid valve is normal in structure. Tricuspid valve regurgitation is mild to moderate. Aortic Valve: The aortic valve is normal in structure. Aortic valve regurgitation is not visualized. Pulmonic Valve: The pulmonic valve was normal in structure. Pulmonic valve regurgitation is not visualized. Aorta: The ascending aorta was not well visualized. IAS/Shunts: No atrial level shunt detected by color flow Doppler.  LEFT VENTRICLE PLAX 2D LVIDd:         2.30 cm LVIDs:         1.70 cm LV PW:         1.60 cm LV IVS:        1.30 cm LVOT diam:     2.00 cm LVOT Area:     3.14 cm  LEFT ATRIUM         Index LA diam:    2.30 cm 1.35 cm/m                        PULMONIC VALVE AORTA                 PV Vmax:        1.05 m/s Ao Root diam: 3.33 cm PV Vmean:       80.800 cm/s                       PV VTI:         0.175 m                       PV Peak grad:   4.4 mmHg                       PV Mean grad:   3.0 mmHg  RVOT Peak grad: 5 mmHg   SHUNTS Systemic Diam: 2.00 cm Pulmonic VTI:  0.211 m Yolonda Kida MD Electronically signed by Yolonda Kida MD Signature Date/Time: 04/29/2022/5:07:03 PM    Final      Echo preserved left ventricular function EF around 50 to 55%  TELEMETRY: Normal sinus rhythm rate of around 75:  ASSESSMENT AND PLAN:  Principal Problem:   Elevated troponin Active Problems:    Generalized anxiety disorder   Hypertension, benign   Stroke [facility])   Fall   Acute renal failure superimposed on stage 3a chronic kidney disease (HCC)   Chronic diastolic CHF (congestive heart failure) (HCC)   Acute metabolic encephalopathy    Plan Ataxia frequent falls unclear etiology does not appear to be cardiac may benefit from neurology evaluation and physical therapy Hypotension mild recommend adequate hydration reduce eliminate antihypertensive medications History of CVA in the past denies any residual hemiparesis continue aspirin and statin therapy as necessary Initial elevated troponin appears to be demand ischemia does not represent ACS recommend conservative management Possible acute metabolic encephalopathy unclear etiology confusion and disorientation has somewhat improved recommend increase activity consider early discharge follow-up with primary physician or neurology Acute on chronic renal insufficiency recommend adequate hydration consider follow-up with nephrology Consider low-dose midodrine if patient remains hypotensive Have the patient follow-up with cardiology 1 to 2 weeks   Yolonda Kida, MD 05/01/2022 7:06 AM

## 2022-05-02 DIAGNOSIS — G9341 Metabolic encephalopathy: Secondary | ICD-10-CM | POA: Diagnosis not present

## 2022-05-02 DIAGNOSIS — N179 Acute kidney failure, unspecified: Secondary | ICD-10-CM | POA: Diagnosis not present

## 2022-05-02 DIAGNOSIS — N1831 Chronic kidney disease, stage 3a: Secondary | ICD-10-CM | POA: Diagnosis not present

## 2022-05-02 DIAGNOSIS — R778 Other specified abnormalities of plasma proteins: Secondary | ICD-10-CM | POA: Diagnosis not present

## 2022-05-02 LAB — CBC
HCT: 37.6 % (ref 36.0–46.0)
Hemoglobin: 12.2 g/dL (ref 12.0–15.0)
MCH: 31.6 pg (ref 26.0–34.0)
MCHC: 32.4 g/dL (ref 30.0–36.0)
MCV: 97.4 fL (ref 80.0–100.0)
Platelets: 193 10*3/uL (ref 150–400)
RBC: 3.86 MIL/uL — ABNORMAL LOW (ref 3.87–5.11)
RDW: 14.2 % (ref 11.5–15.5)
WBC: 6.9 10*3/uL (ref 4.0–10.5)
nRBC: 0 % (ref 0.0–0.2)

## 2022-05-02 LAB — URINE CULTURE: Culture: >=100000 — AB

## 2022-05-02 LAB — BASIC METABOLIC PANEL
Anion gap: 5 (ref 5–15)
BUN: 40 mg/dL — ABNORMAL HIGH (ref 8–23)
CO2: 26 mmol/L (ref 22–32)
Calcium: 8.6 mg/dL — ABNORMAL LOW (ref 8.9–10.3)
Chloride: 110 mmol/L (ref 98–111)
Creatinine, Ser: 1.41 mg/dL — ABNORMAL HIGH (ref 0.44–1.00)
GFR, Estimated: 39 mL/min — ABNORMAL LOW (ref >60)
Glucose, Bld: 106 mg/dL — ABNORMAL HIGH (ref 70–99)
Potassium: 4.2 mmol/L (ref 3.5–5.1)
Sodium: 141 mmol/L (ref 135–145)

## 2022-05-02 LAB — GLUCOSE, CAPILLARY: Glucose-Capillary: 96 mg/dL (ref 70–99)

## 2022-05-02 NOTE — Progress Notes (Signed)
[facility] Cardiology    SUBJECTIVE: Feels much better denies any current vertigo still has some modest weakness has not ambulated much recently denies palpitations or tachycardia would like to go home is not sure for help situation at home since she lives alone.  No chest pain no shortness of breath still has mild back pain from previous fall   Vitals:   05/02/22 0000 05/02/22 0456 05/02/22 0500 05/02/22 0800  BP: 115/62 99/64  122/72  Pulse: 83 80  86  Resp: '14 16  16  '$ Temp: 98.3 F (36.8 C) 98.5 F (36.9 C)  98 F (36.7 C)  TempSrc: Oral Oral  Oral  SpO2: 99% 99%  99%  Weight:   55.9 kg   Height:         Intake/Output Summary (Last 24 hours) at 05/02/2022 0844 Last data filed at 05/02/2022 0030 Gross per 24 hour  Intake 720 ml  Output 300 ml  Net 420 ml      PHYSICAL EXAM  General: Well developed, well nourished, in no acute distress HEENT:  Normocephalic and atramatic Neck:  No JVD.  Lungs: Clear bilaterally to auscultation and percussion. Heart: HRRR . Normal S1 and S2 without gallops or murmurs.  Abdomen: Bowel sounds are positive, abdomen soft and non-tender  Msk:  Back normal, normal gait. Normal strength and tone for age. Extremities: No clubbing, cyanosis or edema.   Neuro: Alert and oriented X 3. Psych:  Good affect, responds appropriately   LABS: Basic Metabolic Panel: Recent Labs    05/01/22 0437 05/02/22 0416  NA 139 141  K 3.7 4.2  CL 108 110  CO2 23 26  GLUCOSE 108* 106*  BUN 42* 40*  CREATININE 1.44* 1.41*  CALCIUM 8.7* 8.6*   Liver Function Tests: No results for input(s): AST, ALT, ALKPHOS, BILITOT, PROT, ALBUMIN in the last 72 hours. No results for input(s): LIPASE, AMYLASE in the last 72 hours. CBC: Recent Labs    05/01/22 0437 05/02/22 0416  WBC 6.7 6.9  HGB 12.9 12.2  HCT 39.9 37.6  MCV 95.0 97.4  PLT 205 193   Cardiac Enzymes: No results for input(s): CKTOTAL, CKMB, CKMBINDEX, TROPONINI in the last 72 hours. BNP: Invalid  input(s): POCBNP D-Dimer: No results for input(s): DDIMER in the last 72 hours. Hemoglobin A1C: No results for input(s): HGBA1C in the last 72 hours. Fasting Lipid Panel: No results for input(s): CHOL, HDL, LDLCALC, TRIG, CHOLHDL, LDLDIRECT in the last 72 hours. Thyroid Function Tests: No results for input(s): TSH, T4TOTAL, T3FREE, THYROIDAB in the last 72 hours.  Invalid input(s): FREET3 Anemia Panel: No results for input(s): VITAMINB12, FOLATE, FERRITIN, TIBC, IRON, RETICCTPCT in the last 72 hours.  No results found.   Echo preserved left ventricular function  TELEMETRY: Normal sinus rhythm rate of 75:  ASSESSMENT AND PLAN:  Principal Problem:   Elevated troponin Active Problems:   Generalized anxiety disorder   Hypertension, benign   Stroke [facility])   Fall   Acute renal failure superimposed on stage 3a chronic kidney disease (HCC)   Chronic diastolic CHF (congestive heart failure) (HCC)   Acute metabolic encephalopathy    Plan Vertigo dizziness multifactorial unlikely to be directly cardiac or arrhythmia related continue conservative medical therapy Elevated troponin suggestive of demand ischemia does not represent non-STEMI no direct cardiac therapy necessary Hypotension modest consider midodrine therapy as necessary Multiple falls possibly related to poor balance as well as vertigo consider physical therapy Severe dehydration contributing to hypotension agree with hydration measures  Possible depression recommend treatment and therapy patient is had significant losses recently and lives alone recently lost husband and son consider psychiatric involvement Acute on chronic renal sufficiency GFR around 30 continue adequate hydration avoid nephrotoxic drugs Stable from a cardiac standpoint at the patient follow-up with cardiology as an outpatient Recommend physical therapy to address strength weakness ataxia balance and multiple recent falls   Yolonda Kida,  MD 05/02/2022 8:44 AM

## 2022-05-02 NOTE — Progress Notes (Signed)
PROGRESS NOTE    Donna Evans  HKV:[id number] DOB: 27-Mar-1946 DOA: 04/28/2022 PCP: Jodi Marble, MD   Assessment & Plan:   Principal Problem:   Elevated troponin Active Problems:   Generalized anxiety disorder   Hypertension, benign   Stroke [facility])   Fall   Acute renal failure superimposed on stage 3a chronic kidney disease (HCC)   Chronic diastolic CHF (congestive heart failure) (HCC)   Acute metabolic encephalopathy  Assessment and Plan: Elevated troponin: likely secondary to demand ischemia. Continue on statin. V/Q scan neg for PE & [place] of b/l LE neg for DVT. Cardio recs apprec. Will need f/u w/ cardio in 1-2 weeks   Acute metabolic encephalopathy: etiology unclear, possible medication induced vs concern of alcohol abuse as per pt's sister. Pt was unable to keep track of which medications were taken when. CT head & MRI brain show no acute intracranial abnormalities. Able to answer questions appropriately again today. Mental status is back to baseline    Chronic diastolic CHF: echo showed EF 60 to 65% with grade 1 diastolic dysfunction in 5643. CHF appears compensated. Hold torsemide    AKI on CKDIIIa: likely secondary to dehydration & use of diuretics. Cr is trending down again today. Hold HCTZ, torsemide & olmesartan    Fall: PT/OT recs SNF. Pt will make final decision tomorrow about SNF vs HH    Hx of CVA: continue on aspirin, statin     HTN: continue to hold all anti-HTN meds as BP is normal    Generalized anxiety disorder: severity unknown. Xanax prn     DVT prophylaxis: heparin sq Code Status: DNR Family Communication: discussed pt's care w/ pt's sister, [name], and answered her questions  Disposition Plan: likely d/c home w/ HH   Level of care: Progressive  Status is: Inpatient Remains inpatient appropriate because: severity of illness   Consultants:    Procedures:   Antimicrobials:   Subjective: Pt c/o fatigue   Objective: Vitals:   05/01/22  1951 05/02/22 0000 05/02/22 0456 05/02/22 0500  BP: 103/61 115/62 99/64   Pulse: 75 83 80   Resp: '17 14 16   '$ Temp: 98.1 F (36.7 C) 98.3 F (36.8 C) 98.5 F (36.9 C)   TempSrc:  Oral Oral   SpO2: 99% 99% 99%   Weight:    55.9 kg  Height:        Intake/Output Summary (Last 24 hours) at 05/02/2022 0725 Last data filed at 05/02/2022 0030 Gross per 24 hour  Intake 720 ml  Output 300 ml  Net 420 ml   Filed Weights   04/30/22 0500 05/01/22 0543 05/02/22 0500  Weight: 55.1 kg 56.3 kg 55.9 kg    Examination:  General exam: appears comfortable  Respiratory system: clear breath sounds b/l Cardiovascular system: S1/S2+. No rubs or clicks  Gastrointestinal system: Abd is soft, NT, ND & hypoactive  Central nervous system: alert and oriented. Moves all extremities  Psychiatry: judgement and insight appears at baseline. Flat mood and affect     Data Reviewed: I have personally reviewed following labs and imaging studies  CBC: Recent Labs  Lab 04/28/22 1220 04/29/22 0504 04/30/22 0434 05/01/22 0437 05/02/22 0416  WBC 10.4 7.4 5.9 6.7 6.9  NEUTROABS 7.6  --   --   --   --   HGB 14.9 12.4 11.3* 12.9 12.2  HCT 45.9 37.4 34.6* 39.9 37.6  MCV 97.0 96.4 95.3 95.0 97.4  PLT 336 218 201 205 193   Basic  Metabolic Panel: Recent Labs  Lab 04/28/22 1220 04/29/22 0504 04/30/22 0434 05/01/22 0437 05/02/22 0416  NA 138 142 141 139 141  K 4.5 4.1 3.8 3.7 4.2  CL 106 114* 113* 108 110  CO2 17* 18* 21* 23 26  GLUCOSE 141* 74 104* 108* 106*  BUN 76* 67* 52* 42* 40*  CREATININE 2.89* 2.04* 1.53* 1.44* 1.41*  CALCIUM 9.1 8.2* 8.4* 8.7* 8.6*   GFR: Estimated Creatinine Clearance: 30 mL/min (A) (by C-G formula based on SCr of 1.41 mg/dL (H)). Liver Function Tests: Recent Labs  Lab 04/28/22 1220  AST 41  ALT 22  ALKPHOS 470*  BILITOT 0.4  PROT 7.8  ALBUMIN 3.9   No results for input(s): LIPASE, AMYLASE in the last 168 hours. No results for input(s): AMMONIA in the last 168  hours. Coagulation Profile: Recent Labs  Lab 04/28/22 1504  INR 1.0   Cardiac Enzymes: Recent Labs  Lab 04/28/22 1504  CKTOTAL 46   BNP (last 3 results) No results for input(s): PROBNP in the last 8760 hours. HbA1C: No results for input(s): HGBA1C in the last 72 hours.  CBG: Recent Labs  Lab 04/29/22 1113 04/30/22 0758  GLUCAP 72 113*   Lipid Profile: No results for input(s): CHOL, HDL, LDLCALC, TRIG, CHOLHDL, LDLDIRECT in the last 72 hours.  Thyroid Function Tests: No results for input(s): TSH, T4TOTAL, FREET4, T3FREE, THYROIDAB in the last 72 hours. Anemia Panel: No results for input(s): VITAMINB12, FOLATE, FERRITIN, TIBC, IRON, RETICCTPCT in the last 72 hours. Sepsis Labs: Recent Labs  Lab 04/28/22 1220  LATICACIDVEN 1.8    Recent Results (from the past 240 hour(s))  Urine Culture     Status: Abnormal (Preliminary result)   Collection Time: 04/29/22  9:30 PM   Specimen: Urine, Clean Catch  Result Value Ref Range Status   Specimen Description   Final    URINE, CLEAN CATCH Performed at [facility], [address] [place], [place] [address]    Special Requests   Final    NONE Performed at [facility] [address] [place], [place] [address]    Culture (A)  Final    >=100,000 COLONIES/mL Lonell Grandchild NEGATIVE RODS SUSCEPTIBILITIES TO FOLLOW Performed at [facility], [place] [address], [place], [place] [address]    Report Status PENDING  Incomplete         Radiology Studies: No results found.      Scheduled Meds:  ALPRAZolam  1 mg Oral BID   aspirin EC  81 mg Oral Daily   heparin injection (subcutaneous)  5,000 Units Subcutaneous Q8H   midodrine  2.5 mg Oral TID WC   rosuvastatin  10 mg Oral Daily   sodium chloride flush  3 mL Intravenous Q12H   zolpidem  5 mg Oral QHS   Continuous Infusions:     LOS: 4 days    Time spent: 25 mins     Wyvonnia Dusky, MD [facility] Pager [phone]  If  7PM-7AM, please contact night-coverage 05/02/2022, 7:25 AM

## 2022-05-02 NOTE — Progress Notes (Signed)
Physical Therapy Treatment Patient Details Name: Donna Evans MRN: [id number] DOB: 01-Jan-1946 Today's Date: 05/02/2022   History of Present Illness 76 y.o. female with medical history significant of hypertension, stroke, GERD, depression with anxiety, migraine headache, right breast cancer (s/p mastectomy), chronic pain syndrome, sCHF, CKD stage IIIa, who presents with AMS.    PT Comments    Pt alert, agreeable to PT, reported 7/10 chronic back pain at start of session. The patient demonstrated good progress towards goals today, improved activity tolerance noted and some improved safety insight as well. The patient still tends to leave RW outside BOS rather than use safely (standing at sink to brush teeth, CGA) and does not dual task well, but was able to ambulate ~161f. Decreased gait velocity noted, CGA for safety with RW. [facility]. Due to pt's recent significant fall history (~10 falls) as well as changes from PLOF, recommendation for SNF remains appropriate at this time pending further pt progress.     Recommendations for follow up therapy are one component of a multi-disciplinary discharge planning process, led by the attending physician.  Recommendations may be updated based on patient status, additional functional criteria and insurance authorization.  Follow Up Recommendations  Skilled nursing-short term rehab (<3 hours/day)     Assistance Recommended at Discharge Frequent or constant Supervision/Assistance  Patient can return home with the following A little help with walking and/or transfers;A little help with bathing/dressing/bathroom;Assist for transportation;Help with stairs or ramp for entrance;Assistance with cooking/housework   Equipment Recommendations  Rolling walker (2 wheels)    Recommendations for Other Services       Precautions / Restrictions Precautions Precautions: Fall Restrictions Weight Bearing  Restrictions: No     Mobility  Bed Mobility Overal bed mobility: Modified Independent Bed Mobility: Supine to Sit                Transfers Overall transfer level: Needs assistance Equipment used: Rolling walker (2 wheels) Transfers: Sit to/from Stand Sit to Stand: Min guard, Min assist           General transfer comment: cued for hand placement to improve safety, first sit <> stand with minA for steadying    Ambulation/Gait Ambulation/Gait assistance: Min guard Gait Distance (Feet): 150 Feet Assistive device: Rolling walker (2 wheels) Gait Pattern/deviations: Step-through pattern Gait velocity: decreased     General Gait Details: pt tends to stop to talk, rather than dual task.   Stairs             Wheelchair Mobility    Modified Rankin (Stroke Patients Only)       Balance Overall balance assessment: Needs assistance Sitting-balance support: Feet supported Sitting balance-Leahy Scale: Good Sitting balance - Comments: able to perform pericare in sitting on BSC   Standing balance support: Bilateral upper extremity supported, During functional activity, Reliant on assistive device for balance Standing balance-Leahy Scale: Fair Standing balance comment: patient requires assist for safety with use of RW and min guard.                            Cognition Arousal/Alertness: Awake/alert Behavior During Therapy: WFL for tasks assessed/performed                                   General Comments: pt oriented to self, place  Exercises      General Comments        Pertinent Vitals/Pain Pain Assessment Pain Assessment: 0-10 Pain Score: 7  Pain Location: back pain Pain Descriptors / Indicators: Aching, Sore Pain Intervention(s): Limited activity within patient's tolerance, Monitored during session, Repositioned    Home Living                          Prior Function            PT Goals  (current goals can now be found in the care plan section) Progress towards PT goals: Progressing toward goals    Frequency    Min 2X/week      PT Plan Current plan remains appropriate    Co-evaluation              AM-PAC PT "6 Clicks" Mobility   Outcome Measure  Help needed turning from your back to your side while in a flat bed without using bedrails?: None Help needed moving from lying on your back to sitting on the side of a flat bed without using bedrails?: None Help needed moving to and from a bed to a chair (including a wheelchair)?: A Little Help needed standing up from a chair using your arms (e.g., wheelchair or bedside chair)?: A Little Help needed to walk in hospital room?: A Little Help needed climbing 3-5 steps with a railing? : A Lot 6 Click Score: 19    End of Session Equipment Utilized During Treatment: Gait belt Activity Tolerance: Patient tolerated treatment well Patient left: in chair;with chair alarm set;with call bell/phone within reach Nurse Communication: Mobility status PT Visit Diagnosis: Unsteadiness on feet (R26.81);Difficulty in walking, not elsewhere classified (R26.2);Muscle weakness (generalized) (M62.81)     Time: 0347-4259 PT Time Calculation (min) (ACUTE ONLY): 31 min  Charges:  $Therapeutic Activity: 23-37 mins                     Lieutenant Diego PT, DPT 3:29 PM,05/02/22

## 2022-05-03 DIAGNOSIS — G9341 Metabolic encephalopathy: Secondary | ICD-10-CM | POA: Diagnosis not present

## 2022-05-03 DIAGNOSIS — N179 Acute kidney failure, unspecified: Secondary | ICD-10-CM | POA: Diagnosis not present

## 2022-05-03 DIAGNOSIS — R778 Other specified abnormalities of plasma proteins: Secondary | ICD-10-CM | POA: Diagnosis not present

## 2022-05-03 DIAGNOSIS — N1831 Chronic kidney disease, stage 3a: Secondary | ICD-10-CM | POA: Diagnosis not present

## 2022-05-03 LAB — CBC
HCT: 34.3 % — ABNORMAL LOW (ref 36.0–46.0)
Hemoglobin: 11.3 g/dL — ABNORMAL LOW (ref 12.0–15.0)
MCH: 31.4 pg (ref 26.0–34.0)
MCHC: 32.9 g/dL (ref 30.0–36.0)
MCV: 95.3 fL (ref 80.0–100.0)
Platelets: 194 10*3/uL (ref 150–400)
RBC: 3.6 MIL/uL — ABNORMAL LOW (ref 3.87–5.11)
RDW: 14 % (ref 11.5–15.5)
WBC: 8.7 10*3/uL (ref 4.0–10.5)
nRBC: 0 % (ref 0.0–0.2)

## 2022-05-03 LAB — BASIC METABOLIC PANEL
Anion gap: 5 (ref 5–15)
BUN: 37 mg/dL — ABNORMAL HIGH (ref 8–23)
CO2: 24 mmol/L (ref 22–32)
Calcium: 8.6 mg/dL — ABNORMAL LOW (ref 8.9–10.3)
Chloride: 110 mmol/L (ref 98–111)
Creatinine, Ser: 1.26 mg/dL — ABNORMAL HIGH (ref 0.44–1.00)
GFR, Estimated: 44 mL/min — ABNORMAL LOW (ref >60)
Glucose, Bld: 118 mg/dL — ABNORMAL HIGH (ref 70–99)
Potassium: 3.9 mmol/L (ref 3.5–5.1)
Sodium: 139 mmol/L (ref 135–145)

## 2022-05-03 LAB — GLUCOSE, CAPILLARY: Glucose-Capillary: 103 mg/dL — ABNORMAL HIGH (ref 70–99)

## 2022-05-03 NOTE — TOC Progression Note (Signed)
Transition of Care [facility]) - Progression Note    Patient Details  Name: Donna Evans MRN: [id number] Date of Birth: 09-May-1946  Transition of Care [facility]) CM/SW [place], [place] Phone Number: 05/03/2022, 2:01 PM  Clinical Narrative:     CSW met with patient at bedside as she reports now being in agreement with SNF recommendation. Reports preference for private room at [facility].   CSW informed [facility] admissions and started HTA insurance auth.   Pending auth at this time.    Expected Discharge Plan: [place] Barriers to Discharge: Continued Medical Work up  Expected Discharge Plan and Services Expected Discharge Plan: [place] arrangements for the past 2 months: Single Family Home                                       Social Determinants of Health (SDOH) Interventions    Readmission Risk Interventions     View : No data to display.

## 2022-05-03 NOTE — Care Management Important Message (Signed)
Important Message  Patient Details  Name: Donna Evans MRN: [id number] Date of Birth: 1945/12/24   Medicare Important Message Given:  Yes     Dannette Barbara 05/03/2022, 10:54 AM

## 2022-05-03 NOTE — Progress Notes (Signed)
[facility] Cardiology    SUBJECTIVE: Patient feels much better denies any palpitation tachycardia no significant lightheaded weakness she has had physical therapy and walked around in the rooms but no recent falls mental status is clear   Vitals:   05/03/22 0426 05/03/22 0823 05/03/22 1200 05/03/22 1600  BP:  (!) 113/56 110/60 110/69  Pulse:  75 77 73  Resp:  '12 14 14  '$ Temp:  97.9 F (36.6 C) 97.8 F (36.6 C) (!) 97.5 F (36.4 C)  TempSrc:  Oral Oral Axillary  SpO2:  98% 97% 98%  Weight: 54.9 kg     Height:         Intake/Output Summary (Last 24 hours) at 05/03/2022 1750 Last data filed at 05/03/2022 1037 Gross per 24 hour  Intake 273 ml  Output 150 ml  Net 123 ml      PHYSICAL EXAM  General: Well developed, well nourished, in no acute distress HEENT:  Normocephalic and atramatic Neck:  No JVD.  Lungs: Clear bilaterally to auscultation and percussion. Heart: HRRR . Normal S1 and S2 without gallops or murmurs.  Abdomen: Bowel sounds are positive, abdomen soft and non-tender  Msk:  Back normal, normal gait. Normal strength and tone for age. Extremities: No clubbing, cyanosis or edema.   Neuro: Alert and oriented X 3. Psych:  Good affect, responds appropriately   LABS: Basic Metabolic Panel: Recent Labs    05/02/22 0416 05/03/22 0216  NA 141 139  K 4.2 3.9  CL 110 110  CO2 26 24  GLUCOSE 106* 118*  BUN 40* 37*  CREATININE 1.41* 1.26*  CALCIUM 8.6* 8.6*   Liver Function Tests: No results for input(s): AST, ALT, ALKPHOS, BILITOT, PROT, ALBUMIN in the last 72 hours. No results for input(s): LIPASE, AMYLASE in the last 72 hours. CBC: Recent Labs    05/02/22 0416 05/03/22 0216  WBC 6.9 8.7  HGB 12.2 11.3*  HCT 37.6 34.3*  MCV 97.4 95.3  PLT 193 194   Cardiac Enzymes: No results for input(s): CKTOTAL, CKMB, CKMBINDEX, TROPONINI in the last 72 hours. BNP: Invalid input(s): POCBNP D-Dimer: No results for input(s): DDIMER in the last 72 hours. Hemoglobin  A1C: No results for input(s): HGBA1C in the last 72 hours. Fasting Lipid Panel: No results for input(s): CHOL, HDL, LDLCALC, TRIG, CHOLHDL, LDLDIRECT in the last 72 hours. Thyroid Function Tests: No results for input(s): TSH, T4TOTAL, T3FREE, THYROIDAB in the last 72 hours.  Invalid input(s): FREET3 Anemia Panel: No results for input(s): VITAMINB12, FOLATE, FERRITIN, TIBC, IRON, RETICCTPCT in the last 72 hours.  No results found.   Echo preserved left ventricular function EF of 55%  TELEMETRY: Normal sinus rhythm rate of 60:  ASSESSMENT AND PLAN:  Principal Problem:   Elevated troponin Active Problems:   Generalized anxiety disorder   Hypertension, benign   Stroke [facility])   Fall   Acute renal failure superimposed on stage 3a chronic kidney disease (HCC)   Chronic diastolic CHF (congestive heart failure) (HCC)   Acute metabolic encephalopathy    Plan Dizziness vertigo somewhat improved recommend physical therapy to help with balance and strength training Hypertension reasonably improved continue medical therapy Multiple falls recommend physical therapy consider neurology involvement Dehydration renal insufficiency maintain adequate hydration Acute metabolic encephalopathy resolved patient mental status back to normal Elevated troponin suggestive of demand ischemia Previous CVA continue conservative therapy aspirin or Plavix Recommend outpatient continue statin therapy Follow-up with cardiology 1 to 2 weeks after discharge We will sign off today no  further cardiac recommendations    Yolonda Kida, MD, 05/03/2022 5:50 PM

## 2022-05-03 NOTE — Progress Notes (Signed)
PROGRESS NOTE    Donna Evans  HYW:[id number] DOB: 27-May-1946 DOA: 04/28/2022 PCP: Jodi Marble, MD   Assessment & Plan:   Principal Problem:   Elevated troponin Active Problems:   Generalized anxiety disorder   Hypertension, benign   Stroke [facility])   Fall   Acute renal failure superimposed on stage 3a chronic kidney disease (HCC)   Chronic diastolic CHF (congestive heart failure) (HCC)   Acute metabolic encephalopathy  Assessment and Plan: Fall & generalized weakness: PT/OT recs SNF. Pt is now agreeable to SNF. CM is working on SNF placement now   Elevated troponin: likely secondary to demand ischemia. Continue on statin. V/Q scan neg for PE & [place] of b/l LE neg for DVT. Cardio recs apprec. Will need cardio f/u in 1-2 weeks   Acute metabolic encephalopathy: etiology unclear, possible medication induced vs concern of alcohol abuse as per pt's sister. Pt was unable to keep track of which medications were taken when. CT head & MRI brain show no acute intracranial abnormalities. Mental status is intermittent    Chronic diastolic CHF: echo showed EF 60 to 65% with grade 1 diastolic dysfunction in 4854. CHF appears compensated. Hold torsemide    AKI on CKDIIIa: likely secondary to dehydration & use of diuretics. Cr continues to trend down. Hold olmesartan, torsemide, HCTZ     Hx of CVA: continue on aspirin, statin    HTN: holding all anti-HTN meds as BP is low normal    Generalized anxiety disorder: severity unknown. Xanax prn    DVT prophylaxis: heparin sq Code Status: DNR Family Communication:  Disposition Plan: d/c to SNF   Level of care: Progressive  Status is: Inpatient Remains inpatient appropriate because: waiting on SNF placement    Consultants:    Procedures:   Antimicrobials:   Subjective: Pt is tearful.   Objective: Vitals:   05/02/22 1100 05/02/22 1559 05/03/22 0009 05/03/22 0426  BP: (!) 131/59 104/72 110/60   Pulse: 83 80 85   Resp: '17 16 16    '$ Temp: 97.9 F (36.6 C) 98 F (36.7 C) 98.6 F (37 C)   TempSrc: Oral Oral Oral   SpO2: 100% 100% 98%   Weight:    54.9 kg  Height:        Intake/Output Summary (Last 24 hours) at 05/03/2022 0741 Last data filed at 05/02/2022 1758 Gross per 24 hour  Intake 450 ml  Output 600 ml  Net -150 ml   Filed Weights   05/01/22 0543 05/02/22 0500 05/03/22 0426  Weight: 56.3 kg 55.9 kg 54.9 kg    Examination:  General exam: Appears uncomfortable  Respiratory system: clear breath sounds b/l  Cardiovascular system: S1/S2+. No rubs or gallops  Gastrointestinal system: Abd is soft, NT, ND & normal bowel sounds  Central nervous system: Alert and awake. Moves all extremities  Psychiatry: judgement and insight appears poor. Flat mood and affect     Data Reviewed: I have personally reviewed following labs and imaging studies  CBC: Recent Labs  Lab 04/28/22 1220 04/29/22 0504 04/30/22 0434 05/01/22 0437 05/02/22 0416 05/03/22 0216  WBC 10.4 7.4 5.9 6.7 6.9 8.7  NEUTROABS 7.6  --   --   --   --   --   HGB 14.9 12.4 11.3* 12.9 12.2 11.3*  HCT 45.9 37.4 34.6* 39.9 37.6 34.3*  MCV 97.0 96.4 95.3 95.0 97.4 95.3  PLT 336 218 201 205 193 627   Basic Metabolic Panel: Recent Labs  Lab 04/29/22  4818 04/30/22 0434 05/01/22 0437 05/02/22 0416 05/03/22 0216  NA 142 141 139 141 139  K 4.1 3.8 3.7 4.2 3.9  CL 114* 113* 108 110 110  CO2 18* 21* '23 26 24  '$ GLUCOSE 74 104* 108* 106* 118*  BUN 67* 52* 42* 40* 37*  CREATININE 2.04* 1.53* 1.44* 1.41* 1.26*  CALCIUM 8.2* 8.4* 8.7* 8.6* 8.6*   GFR: Estimated Creatinine Clearance: 32.9 mL/min (A) (by C-G formula based on SCr of 1.26 mg/dL (H)). Liver Function Tests: Recent Labs  Lab 04/28/22 1220  AST 41  ALT 22  ALKPHOS 470*  BILITOT 0.4  PROT 7.8  ALBUMIN 3.9   No results for input(s): LIPASE, AMYLASE in the last 168 hours. No results for input(s): AMMONIA in the last 168 hours. Coagulation Profile: Recent Labs  Lab  04/28/22 1504  INR 1.0   Cardiac Enzymes: Recent Labs  Lab 04/28/22 1504  CKTOTAL 46   BNP (last 3 results) No results for input(s): PROBNP in the last 8760 hours. HbA1C: No results for input(s): HGBA1C in the last 72 hours.  CBG: Recent Labs  Lab 04/29/22 1113 04/30/22 0758 05/02/22 0812  GLUCAP 72 113* 96   Lipid Profile: No results for input(s): CHOL, HDL, LDLCALC, TRIG, CHOLHDL, LDLDIRECT in the last 72 hours.  Thyroid Function Tests: No results for input(s): TSH, T4TOTAL, FREET4, T3FREE, THYROIDAB in the last 72 hours. Anemia Panel: No results for input(s): VITAMINB12, FOLATE, FERRITIN, TIBC, IRON, RETICCTPCT in the last 72 hours. Sepsis Labs: Recent Labs  Lab 04/28/22 1220  LATICACIDVEN 1.8    Recent Results (from the past 240 hour(s))  Urine Culture     Status: Abnormal   Collection Time: 04/29/22  9:30 PM   Specimen: Urine, Clean Catch  Result Value Ref Range Status   Specimen Description   Final    URINE, CLEAN CATCH Performed at [facility], [address], [place], [place] [address]    Special Requests   Final    NONE Performed at [facility], [place], [place] [address]    Culture >=100,000 COLONIES/mL ENTEROBACTER CLOACAE (A)  Final   Report Status 05/02/2022 FINAL  Final   Organism ID, Bacteria ENTEROBACTER CLOACAE (A)  Final      Susceptibility   Enterobacter cloacae - MIC*    CEFAZOLIN >=64 RESISTANT Resistant     CEFEPIME <=0.12 SENSITIVE Sensitive     CIPROFLOXACIN <=0.25 SENSITIVE Sensitive     GENTAMICIN <=1 SENSITIVE Sensitive     IMIPENEM 2 SENSITIVE Sensitive     NITROFURANTOIN 32 SENSITIVE Sensitive     TRIMETH/SULFA <=20 SENSITIVE Sensitive     PIP/TAZO 8 SENSITIVE Sensitive     * >=100,000 COLONIES/mL ENTEROBACTER CLOACAE         Radiology Studies: No results found.      Scheduled Meds:  ALPRAZolam  1 mg Oral BID   aspirin EC  81 mg Oral Daily   heparin injection  (subcutaneous)  5,000 Units Subcutaneous Q8H   midodrine  2.5 mg Oral TID WC   rosuvastatin  10 mg Oral Daily   sodium chloride flush  3 mL Intravenous Q12H   zolpidem  5 mg Oral QHS   Continuous Infusions:     LOS: 5 days    Time spent: 30 mins     Wyvonnia Dusky, MD [facility] Pager [phone]  If 7PM-7AM, please contact night-coverage 05/03/2022, 7:41 AM

## 2022-05-03 NOTE — Progress Notes (Signed)
Occupational Therapy Treatment Patient Details Name: Donna Evans MRN: [id number] DOB: Jun 30, 1946 Today's Date: 05/03/2022   History of present illness 76 y.o. female with medical history significant of hypertension, stroke, GERD, depression with anxiety, migraine headache, right breast cancer (s/p mastectomy), chronic pain syndrome, sCHF, CKD stage IIIa, who presents with AMS.   OT comments  Upon entering the room, pt supine in bed with c/o rib pain but agreeable to OT intervention. Pt is perseverating on information provided to her by staff regarding possible rehab options and pt needing to be redirected to attend to task. Pt performed bed mobility without physical assistance. Pt stands and ambulates without use of RW with min guard - min A. Pt have 2 LOB posteriorly while in hallway. Pt returning to sink to wash face, comb hair, and brush teeth with min guard for standing balance. All needs within reach and pt returns to bed at end of session. OT continues to recommend short term rehab to address functional deficits.     Recommendations for follow up therapy are one component of a multi-disciplinary discharge planning process, led by the attending physician.  Recommendations may be updated based on patient status, additional functional criteria and insurance authorization.    Follow Up Recommendations  Skilled nursing-short term rehab (<3 hours/day)    Assistance Recommended at Discharge Frequent or constant Supervision/Assistance  Patient can return home with the following  A little help with walking and/or transfers;A little help with bathing/dressing/bathroom;Direct supervision/assist for medications management;Assistance with cooking/housework;Direct supervision/assist for financial management;Help with stairs or ramp for entrance;Assist for transportation   Equipment Recommendations  Other (comment) (defer to next venue of care)       Precautions / Restrictions  Precautions Precautions: Fall       Mobility Bed Mobility   Bed Mobility: Supine to Sit, Sit to Supine     Supine to sit: Modified independent (Device/Increase time) Sit to supine: Modified independent (Device/Increase time)        Transfers Overall transfer level: Needs assistance Equipment used: 1 person hand held assist Transfers: Sit to/from Stand Sit to Stand: Min guard                 Balance Overall balance assessment: Needs assistance Sitting-balance support: Feet supported Sitting balance-Leahy Scale: Good   Postural control: Posterior lean Standing balance support: Bilateral upper extremity supported, During functional activity, Reliant on assistive device for balance Standing balance-Leahy Scale: Fair                             ADL either performed or assessed with clinical judgement   ADL Overall ADL's : Needs assistance/impaired     Grooming: Wash/dry hands;Wash/dry face;Oral care;Standing;Min guard               Lower Body Dressing: Minimal assistance                      Extremity/Trunk Assessment Upper Extremity Assessment Upper Extremity Assessment: Generalized weakness;Overall [facility] for tasks assessed   Lower Extremity Assessment Lower Extremity Assessment: Generalized weakness;Overall WFL for tasks assessed        Vision Patient Visual Report: No change from baseline            Cognition Arousal/Alertness: Awake/alert Behavior During Therapy: WFL for tasks assessed/performed Overall Cognitive Status: No family/caregiver present to determine baseline cognitive functioning  General Comments: Pt is oriented to self and location only.                   Pertinent Vitals/ Pain       Pain Assessment Pain Assessment: 0-10 Pain Score: 4  Pain Location: "ribs" Pain Descriptors / Indicators: Aching, Sore Pain Intervention(s): Limited activity within  patient's tolerance, Monitored during session, Repositioned         Frequency  Min 2X/week        Progress Toward Goals  OT Goals(current goals can now be found in the care plan section)  Progress towards OT goals: Progressing toward goals  Acute Rehab OT Goals Patient Stated Goal: to get better OT Goal Formulation: With patient Time For Goal Achievement: 05/14/22 Potential to Achieve Goals: [place] Discharge plan remains appropriate;Frequency remains appropriate       AM-PAC OT "6 Clicks" Daily Activity     Outcome Measure   Help from another person eating meals?: None Help from another person taking care of personal grooming?: A Little Help from another person toileting, which includes using toliet, bedpan, or urinal?: A Little Help from another person bathing (including washing, rinsing, drying)?: A Little Help from another person to put on and taking off regular upper body clothing?: A Little Help from another person to put on and taking off regular lower body clothing?: A Little 6 Click Score: 19    End of Session    OT Visit Diagnosis: Unsteadiness on feet (R26.81);Repeated falls (R29.6);Muscle weakness (generalized) (M62.81)   Activity Tolerance Patient limited by fatigue   Patient Left in bed;with call bell/phone within reach;with bed alarm set   Nurse Communication Mobility status        Time: 1610-9604 OT Time Calculation (min): 16 min  Charges: OT General Charges $OT Visit: 1 Visit OT Treatments $Self Care/Home Management : 8-22 mins  Darleen Crocker, MS, OTR/L , CBIS ascom [phone]  05/03/22, 1:58 PM

## 2022-05-04 DIAGNOSIS — N3000 Acute cystitis without hematuria: Secondary | ICD-10-CM

## 2022-05-04 DIAGNOSIS — N1832 Chronic kidney disease, stage 3b: Secondary | ICD-10-CM | POA: Diagnosis not present

## 2022-05-04 DIAGNOSIS — I959 Hypotension, unspecified: Secondary | ICD-10-CM | POA: Diagnosis not present

## 2022-05-04 DIAGNOSIS — R4182 Altered mental status, unspecified: Secondary | ICD-10-CM | POA: Diagnosis not present

## 2022-05-04 DIAGNOSIS — G9341 Metabolic encephalopathy: Secondary | ICD-10-CM | POA: Diagnosis not present

## 2022-05-04 DIAGNOSIS — I5022 Chronic systolic (congestive) heart failure: Secondary | ICD-10-CM | POA: Diagnosis not present

## 2022-05-04 DIAGNOSIS — F411 Generalized anxiety disorder: Secondary | ICD-10-CM | POA: Diagnosis not present

## 2022-05-04 DIAGNOSIS — R2681 Unsteadiness on feet: Secondary | ICD-10-CM | POA: Diagnosis not present

## 2022-05-04 DIAGNOSIS — N390 Urinary tract infection, site not specified: Secondary | ICD-10-CM | POA: Diagnosis not present

## 2022-05-04 DIAGNOSIS — Z7401 Bed confinement status: Secondary | ICD-10-CM | POA: Diagnosis not present

## 2022-05-04 DIAGNOSIS — M6281 Muscle weakness (generalized): Secondary | ICD-10-CM | POA: Diagnosis not present

## 2022-05-04 DIAGNOSIS — R278 Other lack of coordination: Secondary | ICD-10-CM | POA: Diagnosis not present

## 2022-05-04 DIAGNOSIS — R296 Repeated falls: Secondary | ICD-10-CM | POA: Diagnosis not present

## 2022-05-04 LAB — CBC
HCT: 35.3 % — ABNORMAL LOW (ref 36.0–46.0)
Hemoglobin: 11.4 g/dL — ABNORMAL LOW (ref 12.0–15.0)
MCH: 30.9 pg (ref 26.0–34.0)
MCHC: 32.3 g/dL (ref 30.0–36.0)
MCV: 95.7 fL (ref 80.0–100.0)
Platelets: 204 10*3/uL (ref 150–400)
RBC: 3.69 MIL/uL — ABNORMAL LOW (ref 3.87–5.11)
RDW: 14.3 % (ref 11.5–15.5)
WBC: 7.5 10*3/uL (ref 4.0–10.5)
nRBC: 0 % (ref 0.0–0.2)

## 2022-05-04 LAB — GLUCOSE, CAPILLARY: Glucose-Capillary: 102 mg/dL — ABNORMAL HIGH (ref 70–99)

## 2022-05-04 LAB — BASIC METABOLIC PANEL
Anion gap: 7 (ref 5–15)
BUN: 31 mg/dL — ABNORMAL HIGH (ref 8–23)
CO2: 24 mmol/L (ref 22–32)
Calcium: 8.6 mg/dL — ABNORMAL LOW (ref 8.9–10.3)
Chloride: 107 mmol/L (ref 98–111)
Creatinine, Ser: 1.15 mg/dL — ABNORMAL HIGH (ref 0.44–1.00)
GFR, Estimated: 49 mL/min — ABNORMAL LOW (ref >60)
Glucose, Bld: 102 mg/dL — ABNORMAL HIGH (ref 70–99)
Potassium: 3.9 mmol/L (ref 3.5–5.1)
Sodium: 138 mmol/L (ref 135–145)

## 2022-05-04 MED ORDER — HYDROCHLOROTHIAZIDE 12.5 MG PO TABS: 12.5000 mg | ORAL_TABLET | Freq: Two times a day (BID) | ORAL | 5 refills | Status: DC

## 2022-05-04 MED ORDER — TORSEMIDE 20 MG PO TABS
20.0000 mg | ORAL_TABLET | Freq: Every day | ORAL | Status: DC | PRN
Start: 2022-05-04 — End: 2022-08-13

## 2022-05-04 MED ORDER — OXYCODONE-ACETAMINOPHEN 5-325 MG PO TABS: 1.0000 | ORAL_TABLET | Freq: Four times a day (QID) | ORAL | 0 refills | Status: AC | PRN

## 2022-05-04 MED ORDER — CYCLOBENZAPRINE HCL 10 MG PO TABS: 10.0000 mg | ORAL_TABLET | Freq: Three times a day (TID) | ORAL | 0 refills | Status: AC | PRN

## 2022-05-04 MED ORDER — ALPRAZOLAM 1 MG PO TABS
1.0000 mg | ORAL_TABLET | Freq: Three times a day (TID) | ORAL | 0 refills | Status: AC | PRN
Start: 2022-05-04 — End: 2022-05-05

## 2022-05-04 MED ORDER — CEFDINIR 300 MG PO CAPS
300.0000 mg | ORAL_CAPSULE | Freq: Two times a day (BID) | ORAL | Status: DC
  Administered 2022-05-04: 300 mg via ORAL
  Filled 2022-05-04: qty 1

## 2022-05-04 MED ORDER — ASPIRIN 81 MG PO TBEC: 81.0000 mg | DELAYED_RELEASE_TABLET | Freq: Every day | ORAL | 0 refills | Status: DC

## 2022-05-04 MED ORDER — OXYCODONE-ACETAMINOPHEN 7.5-325 MG PO TABS: 1.0000 | ORAL_TABLET | Freq: Four times a day (QID) | ORAL | Status: DC | PRN

## 2022-05-04 MED ORDER — CEFDINIR 300 MG PO CAPS: 300.0000 mg | ORAL_CAPSULE | Freq: Two times a day (BID) | ORAL | 0 refills | Status: AC

## 2022-05-04 MED ORDER — ZOLPIDEM TARTRATE 10 MG PO TABS: 10.0000 mg | ORAL_TABLET | Freq: Every day | ORAL | 0 refills | Status: DC

## 2022-05-04 MED ORDER — OLMESARTAN MEDOXOMIL 40 MG PO TABS
40.0000 mg | ORAL_TABLET | Freq: Every morning | ORAL | Status: DC
Start: 2022-05-04 — End: 2022-08-13

## 2022-05-04 NOTE — Discharge Summary (Signed)
Physician Discharge Summary  Donna Evans GYJ:[id number] DOB: 1946-10-04 DOA: 04/28/2022  PCP: Jodi Marble, MD  Admit date: 04/28/2022 Discharge date: 05/04/2022  Admitted From: home  Disposition:  SNF  Recommendations for Outpatient Follow-up:  Follow up with PCP in 1-2 weeks F/u w/ cardio, Dr. Clayborn Bigness, in 1-2 weeks  Home Health: no  Equipment/Devices:   Discharge Condition: stable  CODE STATUS: DNR  Diet recommendation: Heart Healthy   Brief/Interim Summary: HPI was taken from Dr. Blaine Hamper: Donna Evans is a 76 y.o. female with medical history significant of hypertension, stroke, GERD, depression with anxiety, migraine headache, right breast cancer (s/p mastectomy), chronic pain syndrome, sCHF, CKD stage IIIa, who presents with AMS.   Pt has altered mental status, cannot provide accurate medical history.  Per her friend at the bedside, patient had fall a month ago, but not in the past 2 weeks. She has been confused for about 5 days. She was found to have slurred speech.  She moves all extremities normally.  No facial droop, vision loss or hearing loss.  Per ED physician, patient had syncope episode at home, but per patient's friend at bedside, patient did not have syncope or passing out.  Patient denies chest pain, cough, shortness of breath.  Patient has intermittent mild diarrhea per her friends due to IBS, but no diarrhea today.  No nausea, vomiting or abdominal pain.  No symptoms of UTI.  Patient drinks alcohol sometimes, not heavily drinking per her friend.   Pt was found to have hypotension with blood pressures 66/46, which improved to 107/51 after giving 2 L normal saline bolus in the emergency room.   Data Reviewed and ED Course: pt was found to have WBC 10.4, lactic acid 1.8, troponin level 362, 287, positive D-dimer 1.12, worsening renal function with creatinine 2.89, BUN 76, GFR 16 (baseline creatinine 1.32 on 10/13/2021), temperature normal, heart rate 96, RR 16, oxygen  saturation 99% on room air.  Chest x-ray negative.  CT head negative.  Patient is admitted to PCU as inpatient.  Dr. Clayborn Bigness of cardiology is consulted.     As per Dr. Jimmye Norman 5/18-5/23/23: Pt presented w/ acute metabolic encephalopathy that was initially unclear. Urine cx was positive for enterobacter cloacae. Pt was started cefdinir x 7 days.  Of note, pt was found to have elevated troponins on admission which were secondary to demand ischemia as per cardio. Pt will f/u outpatient w/ cardio in 1-2 weeks. PT/OT evaluated the pt and recommended SNF. Pt finally agreed to go to SNF.     Discharge Diagnoses:  Principal Problem:   Elevated troponin Active Problems:   Generalized anxiety disorder   Hypertension, benign   Stroke [facility])   Fall   Acute renal failure superimposed on stage 3a chronic kidney disease (HCC)   Chronic diastolic CHF (congestive heart failure) (HCC)   Acute metabolic encephalopathy Fall & generalized weakness: PT/OT recs SNF. Pt is now agreeable to SNF.   UTI: urine cx is growing enterococcus cloacae. Started on cefdinir x 7 days   Elevated troponin: likely secondary to demand ischemia. Continue on statin. V/Q scan neg for PE & [place] of b/l LE neg for DVT. Cardio recs apprec. Will need cardio f/u in 1-2 weeks    Acute metabolic encephalopathy: etiology unclear, possible medication induced vs concern of alcohol abuse as per pt's sister vs UTI. Pt was unable to keep track of which medications were taken when. CT head & MRI brain show no acute intracranial abnormalities.  Mental status is intermittent    Chronic diastolic CHF: echo showed EF 60 to 65% with grade 1 diastolic dysfunction in 4696. CHF appears compensated. Restart home dose of torsemide    AKI on CKDIIIa: likely secondary to dehydration & use of diuretics. Cr continues to trend down. Restart olmesartan, torsemide, HCTZ , hold for MAP <65 and/or SBP <110    Hx of CVA: continue on aspirin, statin    HTN:  restart  home dose of torsemide, olmesartan, HCTZ. Hold for MAP <65 and/or SBP <110   Generalized anxiety disorder: severity unknown. Xanax prn    Discharge Instructions  Discharge Instructions     Diet - low sodium heart healthy   Complete by: As directed    Discharge instructions   Complete by: As directed    F/u w/ PCP in 1-2 weeks. F/u w/ cardio, Dr. Clayborn Bigness, in 1-2 weeks   Increase activity slowly   Complete by: As directed       Allergies as of 05/04/2022       Reactions   Codeine Anaphylaxis, Hives   Reaction:  Unknown    Demerol [meperidine] Anaphylaxis, Hives   Reaction:  Unknown    Latex Anaphylaxis, Rash   Morphine And Related Hives   Ciprofloxacin Nausea And Vomiting   Doxycycline Nausea And Vomiting   Flagyl [metronidazole] Nausea And Vomiting   Reaction:  Unknown    Fluconazole Hives   Influenza Vaccines Swelling   Localized swelling   Tape Other (See Comments)   Pt allergic to Adhesive tape and latex.(  Skin breaks out)   Valtrex [valacyclovir Hcl] Hives   Duloxetine Hcl Other (See Comments)   Reaction:  Unknown    Amoxicillin-pot Clavulanate Nausea And Vomiting   Sulfamethoxazole-trimethoprim Nausea And Vomiting   Valacyclovir Rash        Medication List     STOP taking these medications    ergocalciferol 1.25 MG (50000 UT) capsule Commonly known as: VITAMIN D2       TAKE these medications    ALPRAZolam 1 MG tablet Commonly known as: XANAX Take 1 tablet (1 mg total) by mouth 3 (three) times daily as needed for anxiety. What changed: when to take this   alum & mag hydroxide-simeth 200-200-20 MG/5ML suspension Commonly known as: MAALOX/MYLANTA Take 15 mLs by mouth every 6 (six) hours as needed for indigestion or heartburn.   aspirin EC 81 MG tablet Take 1 tablet (81 mg total) by mouth daily. Swallow whole. Start taking on: May 05, 2022   butorphanol 10 MG/ML nasal spray Commonly known as: STADOL Place 1 spray into the nose every 4 (four)  hours as needed for headache.   calcium carbonate 500 MG chewable tablet Commonly known as: TUMS - dosed in mg elemental calcium Chew 1 tablet by mouth 3 (three) times daily as needed for indigestion or heartburn.   cefdinir 300 MG capsule Commonly known as: OMNICEF Take 1 capsule (300 mg total) by mouth every 12 (twelve) hours for 7 days.   cyclobenzaprine 10 MG tablet Commonly known as: FLEXERIL Take 10 mg by mouth 3 (three) times daily as needed for muscle spasms.   Dialyvite Vitamin D3 Max 1.25 MG (50000 UT) Tabs Generic drug: Cholecalciferol Take 50,000 mcg by mouth once a week. Sunday   fluticasone 50 MCG/ACT nasal spray Commonly known as: FLONASE Place 2 sprays into both nostrils daily.   hydrochlorothiazide 12.5 MG tablet Commonly known as: HYDRODIURIL Take 1 tablet (12.5 mg total) by mouth  2 (two) times daily. Hold this med for MAP <65 and/or SBP < 110 What changed: additional instructions   olmesartan 40 MG tablet Commonly known as: BENICAR Take 1 tablet (40 mg total) by mouth every morning. Hold this med for MAP <65 and/or SBP < 110 What changed: additional instructions   oxyCODONE-acetaminophen 5-325 MG tablet Commonly known as: PERCOCET/ROXICET Take 1 tablet by mouth every 6 (six) hours as needed for up to 1 day for severe pain or moderate pain. What changed: reasons to take this   rosuvastatin 10 MG tablet Commonly known as: CRESTOR Take 10 mg by mouth daily.   torsemide 20 MG tablet Commonly known as: DEMADEX Take 1 tablet (20 mg total) by mouth daily as needed (fluid). Hold this med for MAP <65 and/or SBP < 110 What changed: additional instructions   VITAMIN B-12 IJ Inject 1,000 mg as directed every 30 (thirty) days.   zolpidem 10 MG tablet Commonly known as: AMBIEN Take 1 tablet (10 mg total) by mouth at bedtime.        Allergies  Allergen Reactions   Codeine Anaphylaxis and Hives    Reaction:  Unknown    Demerol [Meperidine] Anaphylaxis  and Hives    Reaction:  Unknown     Latex Anaphylaxis and Rash   Morphine And Related Hives   Ciprofloxacin Nausea And Vomiting   Doxycycline Nausea And Vomiting   Flagyl [Metronidazole] Nausea And Vomiting    Reaction:  Unknown    Fluconazole Hives   Influenza Vaccines Swelling    Localized swelling   Tape Other (See Comments)    Pt allergic to Adhesive tape and latex.(  Skin breaks out)   Valtrex [Valacyclovir Hcl] Hives   Duloxetine Hcl Other (See Comments)    Reaction:  Unknown    Amoxicillin-Pot Clavulanate Nausea And Vomiting   Sulfamethoxazole-Trimethoprim Nausea And Vomiting   Valacyclovir Rash    Consultations: Cardio    Procedures/Studies: CT HEAD WO CONTRAST (5MM)  Result Date: 04/28/2022 CLINICAL DATA:  Head trauma, minor. EXAM: CT HEAD WITHOUT CONTRAST TECHNIQUE: Contiguous axial images were obtained from the base of the skull through the vertex without intravenous contrast. RADIATION DOSE REDUCTION: This exam was performed according to the departmental dose-optimization program which includes automated exposure control, adjustment of the mA and/or kV according to patient size and/or use of iterative reconstruction technique. COMPARISON:  Prior head CT examinations 12/15/2021 and earlier. Brain MRI 10/01/2016. FINDINGS: Brain: Mild generalized parenchymal atrophy. Mild patchy and ill-defined hypoattenuation within the cerebral white matter, nonspecific but compatible with chronic small vessel ischemic disease. There is no acute intracranial hemorrhage. No demarcated cortical infarct. No extra-axial fluid collection. No evidence of an intracranial mass. No midline shift. Vascular: No hyperdense vessel.  Atherosclerotic calcifications. Skull: Normal. Negative for fracture or focal lesion. Sinuses/Orbits: No mass or acute finding within the imaged orbits. Moderate-sized fluid level within the right sphenoid sinus. Trace ankle thickening within the left maxillary sinus at the  imaged levels IMPRESSION: No evidence of acute intracranial abnormality. Mild chronic small vessel ischemic changes within the cerebral white matter. Mild generalized parenchymal atrophy. Paranasal sinus disease at the imaged levels, as described. Electronically Signed   By: Kellie Simmering D.O.   On: 04/28/2022 13:04   MR BRAIN WO CONTRAST  Result Date: 04/28/2022 CLINICAL DATA:  Altered mental status.  Confusion. EXAM: MRI HEAD WITHOUT CONTRAST TECHNIQUE: Multiplanar, multiecho pulse sequences of the brain and surrounding structures were obtained without intravenous contrast. COMPARISON:  09/30/2016 FINDINGS:  Brain: No acute infarct, mass effect or extra-axial collection. No acute or chronic hemorrhage. There is multifocal hyperintense T2-weighted signal within the white matter. Generalized cerebral volume loss. The midline structures are normal. Vascular: Major flow voids are preserved. Skull and upper cervical spine: Normal calvarium and skull base. Visualized upper cervical spine and soft tissues are normal. Sinuses/Orbits:No paranasal sinus fluid levels or advanced mucosal thickening. No mastoid or middle ear effusion. Normal orbits. IMPRESSION: 1. No acute intracranial abnormality. 2. Findings of chronic microvascular ischemia and cerebral volume loss. Electronically Signed   By: Ulyses Jarred M.D.   On: 04/28/2022 20:59   NM Pulmonary Perfusion  Result Date: 04/29/2022 CLINICAL DATA:  Concern for pulmonary embolism.  Elevated D-dimer EXAM: NUCLEAR MEDICINE PERFUSION LUNG SCAN TECHNIQUE: Perfusion images were obtained in multiple projections after intravenous injection of radiopharmaceutical. RADIOPHARMACEUTICALS:  4.3 mCi Tc-23mMAA COMPARISON:  Radiograph 04/28/2022 FINDINGS: No wedge-shaped peripheral perfusion defect within LEFT or RIGHT lung to suggest acute pulmonary embolism. Small defect at the RIGHT lung base is favored atelectasis IMPRESSION: No evidence acute pulmonary embolism. Electronically  Signed   By: SSuzy BouchardM.D.   On: 04/29/2022 09:35   [place]Ven[place] Img Lower Bilateral (DVT)  Result Date: 04/29/2022 CLINICAL DATA:  Positive D-dimer.  Evaluate for DVT. EXAM: BILATERAL LOWER EXTREMITY VENOUS DOPPLER ULTRASOUND TECHNIQUE: Gray-scale sonography with graded compression, as well as color Doppler and duplex ultrasound were performed to evaluate the lower extremity deep venous systems from the level of the common femoral vein and including the common femoral, femoral, profunda femoral, popliteal and calf veins including the posterior tibial, peroneal and gastrocnemius veins when visible. The superficial great saphenous vein was also interrogated. Spectral Doppler was utilized to evaluate flow at rest and with distal augmentation maneuvers in the common femoral, femoral and popliteal veins. COMPARISON:  None Available. FINDINGS: RIGHT LOWER EXTREMITY Common Femoral Vein: No evidence of thrombus. Normal compressibility, respiratory phasicity and response to augmentation. Saphenofemoral Junction: No evidence of thrombus. Normal compressibility and flow on color Doppler imaging. Profunda Femoral Vein: No evidence of thrombus. Normal compressibility and flow on color Doppler imaging. Femoral Vein: No evidence of thrombus. Normal compressibility, respiratory phasicity and response to augmentation. Popliteal Vein: No evidence of thrombus. Normal compressibility, respiratory phasicity and response to augmentation. Calf Veins: No evidence of thrombus. Normal compressibility and flow on color Doppler imaging. Superficial Great Saphenous Vein: No evidence of thrombus. Normal compressibility. Venous Reflux:  None. Other Findings:  None. LEFT LOWER EXTREMITY Common Femoral Vein: No evidence of thrombus. Normal compressibility, respiratory phasicity and response to augmentation. Saphenofemoral Junction: No evidence of thrombus. Normal compressibility and flow on color Doppler imaging. Profunda Femoral Vein: No  evidence of thrombus. Normal compressibility and flow on color Doppler imaging. Femoral Vein: No evidence of thrombus. Normal compressibility, respiratory phasicity and response to augmentation. Popliteal Vein: No evidence of thrombus. Normal compressibility, respiratory phasicity and response to augmentation. Calf Veins: No evidence of thrombus. Normal compressibility and flow on color Doppler imaging. Superficial Great Saphenous Vein: No evidence of thrombus. Normal compressibility. Venous Reflux:  None. Other Findings:  None. IMPRESSION: No evidence of deep venous thrombosis in either lower extremity. Electronically Signed   By: HJacqulynn CadetM.D.   On: 04/29/2022 10:04   DG Chest Port 1 View  Result Date: 04/28/2022 CLINICAL DATA:  Altered mental status, questionable sepsis, slurred speech, fall. EXAM: PORTABLE CHEST 1 VIEW COMPARISON:  12/18/2021. FINDINGS: Trachea is midline. Heart size normal. Biapical pleuroparenchymal scarring. Lungs are clear.  No pleural fluid. Healed fractures of the posterior left seventh and eighth ribs. Healed left humeral neck fracture. IMPRESSION: No acute findings. Electronically Signed   By: Lorin Picket M.D.   On: 04/28/2022 13:13   ECHOCARDIOGRAM COMPLETE  Result Date: 04/29/2022    ECHOCARDIOGRAM REPORT   Patient Name:   Donna Evans Date of Exam: 04/29/2022 Medical Rec #:  [id number]      Height:       67.0 in Accession #:    [id number]     Weight:       134.7 lb Date of Birth:  01-16-1946       BSA:          1.710 m Patient Age:    2 years       BP:           139/76 mmHg Patient Gender: F              HR:           73 bpm. Exam Location:  [facility] Procedure: 2D Echo, Cardiac Doppler and Color Doppler Indications:     Syncope R55  History:         Patient has prior history of Echocardiogram examinations, most                  recent 10/01/2016. COPD; Risk Factors:Hypertension.  Sonographer:     Sherrie Sport Referring Phys:  [id number] Soledad Gerlach NIU Diagnosing Phys: Yolonda Kida MD  Sonographer Comments: Technically challenging study due to limited acoustic windows, no apical window and no subcostal window. Image acquisition challenging due to breast implants. IMPRESSIONS  1. Left ventricular ejection fraction, by estimation, is 50 to 55%. The left ventricle has normal function. The left ventricle has no regional wall motion abnormalities. There is moderate concentric left ventricular hypertrophy. Left ventricular diastolic parameters are consistent with Grade I diastolic dysfunction (impaired relaxation).  2. Right ventricular systolic function is normal. The right ventricular size is normal.  3. The mitral valve is normal in structure. No evidence of mitral valve regurgitation.  4. Tricuspid valve regurgitation is mild to moderate.  5. The aortic valve is normal in structure. Aortic valve regurgitation is not visualized. FINDINGS  Left Ventricle: Left ventricular ejection fraction, by estimation, is 50 to 55%. The left ventricle has normal function. The left ventricle has no regional wall motion abnormalities. The left ventricular internal cavity size was normal in size. There is  moderate concentric left ventricular hypertrophy. Left ventricular diastolic parameters are consistent with Grade I diastolic dysfunction (impaired relaxation). Right Ventricle: The right ventricular size is normal. No increase in right ventricular wall thickness. Right ventricular systolic function is normal. Left Atrium: Left atrial size was normal in size. Right Atrium: Right atrial size was normal in size. Pericardium: There is no evidence of pericardial effusion. Mitral Valve: The mitral valve is normal in structure. No evidence of mitral valve regurgitation. Tricuspid Valve: The tricuspid valve is normal in structure. Tricuspid valve regurgitation is mild to moderate. Aortic Valve: The aortic valve is normal in structure. Aortic valve regurgitation is not visualized. Pulmonic Valve: The pulmonic  valve was normal in structure. Pulmonic valve regurgitation is not visualized. Aorta: The ascending aorta was not well visualized. IAS/Shunts: No atrial level shunt detected by color flow Doppler.  LEFT VENTRICLE PLAX 2D LVIDd:         2.30 cm LVIDs:         1.70 cm LV PW:  1.60 cm LV IVS:        1.30 cm LVOT diam:     2.00 cm LVOT Area:     3.14 cm  LEFT ATRIUM         Index LA diam:    2.30 cm 1.35 cm/m                        PULMONIC VALVE AORTA                 PV Vmax:        1.05 m/s Ao Root diam: 3.33 cm PV Vmean:       80.800 cm/s                       PV VTI:         0.175 m                       PV Peak grad:   4.4 mmHg                       PV Mean grad:   3.0 mmHg                       RVOT Peak grad: 5 mmHg   SHUNTS Systemic Diam: 2.00 cm Pulmonic VTI:  0.211 m Dwayne D Callwood MD Electronically signed by Yolonda Kida MD Signature Date/Time: 04/29/2022/5:07:03 PM    Final    (Echo, Carotid, EGD, Colonoscopy, ERCP)    Subjective: pt c/o fatigue    Discharge Exam: Vitals:   05/04/22 0314 05/04/22 0743  BP: (!) 98/46 (!) 99/51  Pulse: 74 71  Resp: 15 16  Temp: 98.4 F (36.9 C) 98.2 F (36.8 C)  SpO2: 96% 97%   Vitals:   05/03/22 1600 05/03/22 1940 05/04/22 0314 05/04/22 0743  BP: 110/69 (!) 105/58 (!) 98/46 (!) 99/51  Pulse: 73 83 74 71  Resp: '14 15 15 16  '$ Temp: (!) 97.5 F (36.4 C) 97.6 F (36.4 C) 98.4 F (36.9 C) 98.2 F (36.8 C)  TempSrc: Axillary  Oral Oral  SpO2: 98%  96% 97%  Weight:   58.2 kg   Height:        General: Pt is alert, awake, not in acute distress Cardiovascular: S1/S2 +, no rubs, no gallops Respiratory: CTA bilaterally, no wheezing, no rhonchi Abdominal: Soft, NT, ND, bowel sounds + Extremities: no edema, no cyanosis    The results of significant diagnostics from this hospitalization (including imaging, microbiology, ancillary and laboratory) are listed below for reference.     Microbiology: Recent Results (from the past 240  hour(s))  Urine Culture     Status: Abnormal   Collection Time: 04/29/22  9:30 PM   Specimen: Urine, Clean Catch  Result Value Ref Range Status   Specimen Description   Final    URINE, CLEAN CATCH Performed at [facility], [address], [place], [place] [address]    Special Requests   Final    NONE Performed at [facility], [address] [place], [place] [address]    Culture >=100,000 COLONIES/mL ENTEROBACTER CLOACAE (A)  Final   Report Status 05/02/2022 FINAL  Final   Organism ID, Bacteria ENTEROBACTER CLOACAE (A)  Final      Susceptibility   Enterobacter cloacae - MIC*    CEFAZOLIN >=64 RESISTANT Resistant  CEFEPIME <=0.12 SENSITIVE Sensitive     CIPROFLOXACIN <=0.25 SENSITIVE Sensitive     GENTAMICIN <=1 SENSITIVE Sensitive     IMIPENEM 2 SENSITIVE Sensitive     NITROFURANTOIN 32 SENSITIVE Sensitive     TRIMETH/SULFA <=20 SENSITIVE Sensitive     PIP/TAZO 8 SENSITIVE Sensitive     * >=100,000 COLONIES/mL ENTEROBACTER CLOACAE     Labs: BNP (last 3 results) Recent Labs    04/28/22 1241  BNP 614.4*   Basic Metabolic Panel: Recent Labs  Lab 04/30/22 0434 05/01/22 0437 05/02/22 0416 05/03/22 0216 05/04/22 0416  NA 141 139 141 139 138  K 3.8 3.7 4.2 3.9 3.9  CL 113* 108 110 110 107  CO2 21* '23 26 24 24  '$ GLUCOSE 104* 108* 106* 118* 102*  BUN 52* 42* 40* 37* 31*  CREATININE 1.53* 1.44* 1.41* 1.26* 1.15*  CALCIUM 8.4* 8.7* 8.6* 8.6* 8.6*   Liver Function Tests: Recent Labs  Lab 04/28/22 1220  AST 41  ALT 22  ALKPHOS 470*  BILITOT 0.4  PROT 7.8  ALBUMIN 3.9   No results for input(s): LIPASE, AMYLASE in the last 168 hours. No results for input(s): AMMONIA in the last 168 hours. CBC: Recent Labs  Lab 04/28/22 1220 04/29/22 0504 04/30/22 0434 05/01/22 0437 05/02/22 0416 05/03/22 0216 05/04/22 0416  WBC 10.4   < > 5.9 6.7 6.9 8.7 7.5  NEUTROABS 7.6  --   --   --   --   --   --   HGB 14.9   < > 11.3* 12.9 12.2 11.3* 11.4*   HCT 45.9   < > 34.6* 39.9 37.6 34.3* 35.3*  MCV 97.0   < > 95.3 95.0 97.4 95.3 95.7  PLT 336   < > 201 205 193 194 204   < > = values in this interval not displayed.   Cardiac Enzymes: Recent Labs  Lab 04/28/22 1504  CKTOTAL 46   BNP: Invalid input(s): POCBNP CBG: Recent Labs  Lab 04/29/22 1113 04/30/22 0758 05/02/22 0812 05/03/22 0826 05/04/22 0742  GLUCAP 72 113* 96 103* 102*   D-Dimer No results for input(s): DDIMER in the last 72 hours. Hgb A1c No results for input(s): HGBA1C in the last 72 hours. Lipid Profile No results for input(s): CHOL, HDL, LDLCALC, TRIG, CHOLHDL, LDLDIRECT in the last 72 hours. Thyroid function studies No results for input(s): TSH, T4TOTAL, T3FREE, THYROIDAB in the last 72 hours.  Invalid input(s): FREET3 Anemia work up No results for input(s): VITAMINB12, FOLATE, FERRITIN, TIBC, IRON, RETICCTPCT in the last 72 hours. Urinalysis    Component Value Date/Time   COLORURINE YELLOW (A) 04/28/2022 2130   APPEARANCEUR CLOUDY (A) 04/28/2022 2130   LABSPEC 1.013 04/28/2022 2130   PHURINE 5.0 04/28/2022 2130   GLUCOSEU NEGATIVE 04/28/2022 2130   HGBUR MODERATE (A) 04/28/2022 2130   BILIRUBINUR NEGATIVE 04/28/2022 2130   BILIRUBINUR neg 11/10/2015 1352   KETONESUR 5 (A) 04/28/2022 2130   PROTEINUR 30 (A) 04/28/2022 2130   UROBILINOGEN negative 11/10/2015 1352   UROBILINOGEN 0.2 11/20/2013 0201   NITRITE POSITIVE (A) 04/28/2022 2130   LEUKOCYTESUR LARGE (A) 04/28/2022 2130   Sepsis Labs Invalid input(s): PROCALCITONIN,  WBC,  LACTICIDVEN Microbiology Recent Results (from the past 240 hour(s))  Urine Culture     Status: Abnormal   Collection Time: 04/29/22  9:30 PM   Specimen: Urine, Clean Catch  Result Value Ref Range Status   Specimen Description   Final    URINE, CLEAN CATCH Performed  at [facility], [address] [place], [place] [address]    Special Requests   Final    NONE Performed at [facility], [address] [place], [place] [address]    Culture >=100,000 COLONIES/mL ENTEROBACTER CLOACAE (A)  Final   Report Status 05/02/2022 FINAL  Final   Organism ID, Bacteria ENTEROBACTER CLOACAE (A)  Final      Susceptibility   Enterobacter cloacae - MIC*    CEFAZOLIN >=64 RESISTANT Resistant     CEFEPIME <=0.12 SENSITIVE Sensitive     CIPROFLOXACIN <=0.25 SENSITIVE Sensitive     GENTAMICIN <=1 SENSITIVE Sensitive     IMIPENEM 2 SENSITIVE Sensitive     NITROFURANTOIN 32 SENSITIVE Sensitive     TRIMETH/SULFA <=20 SENSITIVE Sensitive     PIP/TAZO 8 SENSITIVE Sensitive     * >=100,000 COLONIES/mL ENTEROBACTER CLOACAE     Time coordinating discharge: Over 30 minutes  SIGNED:   Wyvonnia Dusky, MD  [facility] 05/04/2022, 11:10 AM Pager   If 7PM-7AM, please contact night-coverage

## 2022-05-04 NOTE — TOC Transition Note (Signed)
Transition of Care [facility]) - CM/SW Discharge Note   Patient Details  Name: Donna Evans MRN: [id number] Date of Birth: 1946/07/12  Transition of Care [facility]) CM/SW Contact:  Alberteen Sam, LCSW Phone Number: 05/04/2022, 11:25 AM   Clinical Narrative:     Patient will DC to: [facility] Anticipated DC date: 05/04/22 Transport [id number]  Per MD patient ready for DC to [place] . RN, patient, patient's family, and facility notified of DC. Discharge Summary sent to facility. RN given number for report  [phone] Room 603P. DC packet on chart. Ambulance transport requested for patient.  CSW signing off.  Pricilla Riffle, LCSW    Final next level of care: Skilled Nursing Facility Barriers to Discharge: No Barriers Identified   Patient Goals and CMS Choice Patient states their goals for this hospitalization and ongoing recovery are:: to go home CMS Medicare.gov Compare Post Acute Care list provided to:: Patient Choice offered to / list presented to : Patient  Discharge Placement              Patient chooses bed at: [facility] Patient to be transferred to facility by: [facility]   Patient and family notified of of transfer: 05/04/22  Discharge Plan and Services                                     Social Determinants of Health (SDOH) Interventions     Readmission Risk Interventions     View : No data to display.

## 2022-05-05 DIAGNOSIS — R296 Repeated falls: Secondary | ICD-10-CM | POA: Diagnosis not present

## 2022-05-05 DIAGNOSIS — M6281 Muscle weakness (generalized): Secondary | ICD-10-CM | POA: Diagnosis not present

## 2022-05-05 DIAGNOSIS — G9341 Metabolic encephalopathy: Secondary | ICD-10-CM | POA: Diagnosis not present

## 2022-05-05 DIAGNOSIS — I5022 Chronic systolic (congestive) heart failure: Secondary | ICD-10-CM | POA: Diagnosis not present

## 2022-05-05 DIAGNOSIS — F411 Generalized anxiety disorder: Secondary | ICD-10-CM | POA: Diagnosis not present

## 2022-05-05 DIAGNOSIS — N390 Urinary tract infection, site not specified: Secondary | ICD-10-CM | POA: Diagnosis not present

## 2022-05-05 DIAGNOSIS — N1832 Chronic kidney disease, stage 3b: Secondary | ICD-10-CM | POA: Diagnosis not present

## 2022-05-12 DIAGNOSIS — N189 Chronic kidney disease, unspecified: Secondary | ICD-10-CM | POA: Diagnosis not present

## 2022-05-12 DIAGNOSIS — I129 Hypertensive chronic kidney disease with stage 1 through stage 4 chronic kidney disease, or unspecified chronic kidney disease: Secondary | ICD-10-CM | POA: Diagnosis not present

## 2022-05-12 DIAGNOSIS — E785 Hyperlipidemia, unspecified: Secondary | ICD-10-CM | POA: Diagnosis not present

## 2022-05-16 DIAGNOSIS — N1831 Chronic kidney disease, stage 3a: Secondary | ICD-10-CM | POA: Diagnosis not present

## 2022-05-16 DIAGNOSIS — Z8616 Personal history of COVID-19: Secondary | ICD-10-CM | POA: Diagnosis not present

## 2022-05-16 DIAGNOSIS — G43909 Migraine, unspecified, not intractable, without status migrainosus: Secondary | ICD-10-CM | POA: Diagnosis not present

## 2022-05-16 DIAGNOSIS — Z8673 Personal history of transient ischemic attack (TIA), and cerebral infarction without residual deficits: Secondary | ICD-10-CM | POA: Diagnosis not present

## 2022-05-16 DIAGNOSIS — Z853 Personal history of malignant neoplasm of breast: Secondary | ICD-10-CM | POA: Diagnosis not present

## 2022-05-16 DIAGNOSIS — Z87891 Personal history of nicotine dependence: Secondary | ICD-10-CM | POA: Diagnosis not present

## 2022-05-16 DIAGNOSIS — Z7982 Long term (current) use of aspirin: Secondary | ICD-10-CM | POA: Diagnosis not present

## 2022-05-16 DIAGNOSIS — M1990 Unspecified osteoarthritis, unspecified site: Secondary | ICD-10-CM | POA: Diagnosis not present

## 2022-05-16 DIAGNOSIS — K219 Gastro-esophageal reflux disease without esophagitis: Secondary | ICD-10-CM | POA: Diagnosis not present

## 2022-05-16 DIAGNOSIS — F32A Depression, unspecified: Secondary | ICD-10-CM | POA: Diagnosis not present

## 2022-05-16 DIAGNOSIS — G894 Chronic pain syndrome: Secondary | ICD-10-CM | POA: Diagnosis not present

## 2022-05-16 DIAGNOSIS — I130 Hypertensive heart and chronic kidney disease with heart failure and stage 1 through stage 4 chronic kidney disease, or unspecified chronic kidney disease: Secondary | ICD-10-CM | POA: Diagnosis not present

## 2022-05-16 DIAGNOSIS — J449 Chronic obstructive pulmonary disease, unspecified: Secondary | ICD-10-CM | POA: Diagnosis not present

## 2022-05-16 DIAGNOSIS — R4182 Altered mental status, unspecified: Secondary | ICD-10-CM | POA: Diagnosis not present

## 2022-05-16 DIAGNOSIS — Z9049 Acquired absence of other specified parts of digestive tract: Secondary | ICD-10-CM | POA: Diagnosis not present

## 2022-05-16 DIAGNOSIS — N390 Urinary tract infection, site not specified: Secondary | ICD-10-CM | POA: Diagnosis not present

## 2022-05-16 DIAGNOSIS — G9341 Metabolic encephalopathy: Secondary | ICD-10-CM | POA: Diagnosis not present

## 2022-05-16 DIAGNOSIS — N179 Acute kidney failure, unspecified: Secondary | ICD-10-CM | POA: Diagnosis not present

## 2022-05-16 DIAGNOSIS — M797 Fibromyalgia: Secondary | ICD-10-CM | POA: Diagnosis not present

## 2022-05-16 DIAGNOSIS — I5032 Chronic diastolic (congestive) heart failure: Secondary | ICD-10-CM | POA: Diagnosis not present

## 2022-05-16 DIAGNOSIS — G629 Polyneuropathy, unspecified: Secondary | ICD-10-CM | POA: Diagnosis not present

## 2022-05-16 DIAGNOSIS — F411 Generalized anxiety disorder: Secondary | ICD-10-CM | POA: Diagnosis not present

## 2022-05-16 DIAGNOSIS — M329 Systemic lupus erythematosus, unspecified: Secondary | ICD-10-CM | POA: Diagnosis not present

## 2022-05-16 DIAGNOSIS — Z90710 Acquired absence of both cervix and uterus: Secondary | ICD-10-CM | POA: Diagnosis not present

## 2022-05-19 DIAGNOSIS — G8929 Other chronic pain: Secondary | ICD-10-CM | POA: Diagnosis not present

## 2022-05-19 DIAGNOSIS — F411 Generalized anxiety disorder: Secondary | ICD-10-CM | POA: Diagnosis not present

## 2022-05-19 DIAGNOSIS — I10 Essential (primary) hypertension: Secondary | ICD-10-CM | POA: Diagnosis not present

## 2022-05-19 DIAGNOSIS — J301 Allergic rhinitis due to pollen: Secondary | ICD-10-CM | POA: Diagnosis not present

## 2022-05-19 DIAGNOSIS — M810 Age-related osteoporosis without current pathological fracture: Secondary | ICD-10-CM | POA: Diagnosis not present

## 2022-05-19 DIAGNOSIS — M7061 Trochanteric bursitis, right hip: Secondary | ICD-10-CM | POA: Diagnosis not present

## 2022-05-19 DIAGNOSIS — G4709 Other insomnia: Secondary | ICD-10-CM | POA: Diagnosis not present

## 2022-05-19 DIAGNOSIS — N189 Chronic kidney disease, unspecified: Secondary | ICD-10-CM | POA: Diagnosis not present

## 2022-05-19 DIAGNOSIS — K219 Gastro-esophageal reflux disease without esophagitis: Secondary | ICD-10-CM | POA: Diagnosis not present

## 2022-05-19 DIAGNOSIS — E785 Hyperlipidemia, unspecified: Secondary | ICD-10-CM | POA: Diagnosis not present

## 2022-05-19 DIAGNOSIS — I739 Peripheral vascular disease, unspecified: Secondary | ICD-10-CM | POA: Diagnosis not present

## 2022-05-26 ENCOUNTER — Encounter (INDEPENDENT_AMBULATORY_CARE_PROVIDER_SITE_OTHER): Payer: Self-pay | Admitting: Nurse Practitioner

## 2022-05-26 ENCOUNTER — Ambulatory Visit (INDEPENDENT_AMBULATORY_CARE_PROVIDER_SITE_OTHER): Payer: PPO | Admitting: Nurse Practitioner

## 2022-05-26 VITALS — BP 106/70 | HR 94 | Resp 15 | Wt 121.0 lb

## 2022-05-26 DIAGNOSIS — I872 Venous insufficiency (chronic) (peripheral): Secondary | ICD-10-CM | POA: Diagnosis not present

## 2022-05-26 DIAGNOSIS — R6889 Other general symptoms and signs: Secondary | ICD-10-CM

## 2022-05-31 DIAGNOSIS — K219 Gastro-esophageal reflux disease without esophagitis: Secondary | ICD-10-CM | POA: Diagnosis not present

## 2022-05-31 DIAGNOSIS — F411 Generalized anxiety disorder: Secondary | ICD-10-CM | POA: Diagnosis not present

## 2022-05-31 DIAGNOSIS — N1831 Chronic kidney disease, stage 3a: Secondary | ICD-10-CM | POA: Diagnosis not present

## 2022-05-31 DIAGNOSIS — J449 Chronic obstructive pulmonary disease, unspecified: Secondary | ICD-10-CM | POA: Diagnosis not present

## 2022-05-31 DIAGNOSIS — I130 Hypertensive heart and chronic kidney disease with heart failure and stage 1 through stage 4 chronic kidney disease, or unspecified chronic kidney disease: Secondary | ICD-10-CM | POA: Diagnosis not present

## 2022-05-31 DIAGNOSIS — G43909 Migraine, unspecified, not intractable, without status migrainosus: Secondary | ICD-10-CM | POA: Diagnosis not present

## 2022-05-31 DIAGNOSIS — M797 Fibromyalgia: Secondary | ICD-10-CM | POA: Diagnosis not present

## 2022-05-31 DIAGNOSIS — G9341 Metabolic encephalopathy: Secondary | ICD-10-CM | POA: Diagnosis not present

## 2022-05-31 DIAGNOSIS — Z853 Personal history of malignant neoplasm of breast: Secondary | ICD-10-CM | POA: Diagnosis not present

## 2022-05-31 DIAGNOSIS — Z8673 Personal history of transient ischemic attack (TIA), and cerebral infarction without residual deficits: Secondary | ICD-10-CM | POA: Diagnosis not present

## 2022-05-31 DIAGNOSIS — Z90710 Acquired absence of both cervix and uterus: Secondary | ICD-10-CM | POA: Diagnosis not present

## 2022-05-31 DIAGNOSIS — Z7982 Long term (current) use of aspirin: Secondary | ICD-10-CM | POA: Diagnosis not present

## 2022-05-31 DIAGNOSIS — G894 Chronic pain syndrome: Secondary | ICD-10-CM | POA: Diagnosis not present

## 2022-05-31 DIAGNOSIS — M329 Systemic lupus erythematosus, unspecified: Secondary | ICD-10-CM | POA: Diagnosis not present

## 2022-05-31 DIAGNOSIS — R4182 Altered mental status, unspecified: Secondary | ICD-10-CM | POA: Diagnosis not present

## 2022-05-31 DIAGNOSIS — Z87891 Personal history of nicotine dependence: Secondary | ICD-10-CM | POA: Diagnosis not present

## 2022-05-31 DIAGNOSIS — N390 Urinary tract infection, site not specified: Secondary | ICD-10-CM | POA: Diagnosis not present

## 2022-05-31 DIAGNOSIS — Z9049 Acquired absence of other specified parts of digestive tract: Secondary | ICD-10-CM | POA: Diagnosis not present

## 2022-05-31 DIAGNOSIS — M1990 Unspecified osteoarthritis, unspecified site: Secondary | ICD-10-CM | POA: Diagnosis not present

## 2022-05-31 DIAGNOSIS — Z8616 Personal history of COVID-19: Secondary | ICD-10-CM | POA: Diagnosis not present

## 2022-05-31 DIAGNOSIS — G629 Polyneuropathy, unspecified: Secondary | ICD-10-CM | POA: Diagnosis not present

## 2022-05-31 DIAGNOSIS — N179 Acute kidney failure, unspecified: Secondary | ICD-10-CM | POA: Diagnosis not present

## 2022-05-31 DIAGNOSIS — F32A Depression, unspecified: Secondary | ICD-10-CM | POA: Diagnosis not present

## 2022-05-31 DIAGNOSIS — I5032 Chronic diastolic (congestive) heart failure: Secondary | ICD-10-CM | POA: Diagnosis not present

## 2022-06-06 ENCOUNTER — Encounter (INDEPENDENT_AMBULATORY_CARE_PROVIDER_SITE_OTHER): Payer: Self-pay | Admitting: Nurse Practitioner

## 2022-06-06 NOTE — Progress Notes (Signed)
Subjective:    Patient ID: Donna Evans, female    DOB: Mar 16, 1946, 76 y.o.   MRN: [id number] Chief Complaint  Patient presents with   New Patient (Initial Visit)    Ref Paraschos consult venous insufficiency,right foot pain    Donna Evans is a 76 year old female that presents today as a referral from Dr. Marina Goodell shows in order to evaluate for lower extremity swelling in addition to abnormal ABIs.  The patient has noted chronic pedal edema which has been worse over the last several months.  She was previously taking Demadex without significant improvement.  She also recently started HCTZ which has noted some improvement.  She has notable varicosities bilaterally in the lower extremities.  She reports having right hip pain and discomfort which radiates down her leg.  She also has some numbness and tingling in her right foot as well.  She also has a history of back pain and back issues.  Her noninvasive studies done at her primary care provider notes an ABI 0.63 on the right lower extremity and 1.40 on the left however the test was complicated due to the patient not being able to tolerate the compression of the study.    Review of Systems  Cardiovascular:  Positive for leg swelling.  All other systems reviewed and are negative.      Objective:   Physical Exam Vitals reviewed.  HENT:     Head: Normocephalic.  Cardiovascular:     Rate and Rhythm: Normal rate.     Pulses:          Dorsalis pedis pulses are 1+ on the left side.       Posterior tibial pulses are 1+ on the left side.  Pulmonary:     Effort: Pulmonary effort is normal.  Musculoskeletal:        General: Tenderness present.     Right lower leg: Edema present.     Left lower leg: Edema present.  Skin:    General: Skin is warm and dry.  Neurological:     Mental Status: She is alert and oriented to person, place, and time.  Psychiatric:        Mood and Affect: Mood normal.        Behavior: Behavior normal.         Thought Content: Thought content normal.        Judgment: Judgment normal.     BP 106/70 (BP Location: Right Arm)   Pulse 94   Resp 15   Wt 121 lb (54.9 kg)   BMI 18.95 kg/m   Past Medical History:  Diagnosis Date   Allergic state    Anginal pain (HCC)    Prinzmetal's angina   Anxiety    Arthritis    osteoarthritis   Breast cancer (HCC) 1990   right breast cancer   Chronic pain    Chronic pain    COPD (chronic obstructive pulmonary disease) (HCC)    Depression    Edema    Fibromyalgia    Foot fracture    Bilateral   GERD (gastroesophageal reflux disease)    History of kidney stones    Hypertension    Leg fracture, right    Low back pain    Lumbosacral neuritis    Medulloadrenal hyperfunction (HCC)    Migraine headache    Peripheral neuropathy    Shoulder fracture, right    Stroke (HCC)    TIA   Systemic lupus erythematosus (HCC)  Thyroid disease    TIA (transient ischemic attack) 11/28/2013   Transient global amnesia 2011    Social History   Socioeconomic History   Marital status: Married    Spouse name: Not on file   Number of children: 1   Years of education: college3   Highest education level: Not on file  Occupational History   Occupation: Retired  Tobacco Use   Smoking status: Former    Packs/day: 1.00    Years: 40.00    Total pack years: 40.00    Types: Cigarettes    Quit date: 08/28/2005    Years since quitting: 16.7   Smokeless tobacco: Never  Vaping Use   Vaping Use: Never used  Substance and Sexual Activity   Alcohol use: Not Currently    Alcohol/week: 2.0 standard drinks of alcohol    Types: 2 Shots of liquor per week   Drug use: No   Sexual activity: Never  Other Topics Concern   Not on file  Social History Narrative   Not on file   Social Determinants of Health   Financial Resource Strain: Not on file  Food Insecurity: Not on file  Transportation Needs: Not on file  Physical Activity: Not on file  Stress: Not on file   Social Connections: Not on file  Intimate Partner Violence: Not on file    Past Surgical History:  Procedure Laterality Date   ABDOMINAL HYSTERECTOMY  1981   APPENDECTOMY  1957   AUGMENTATION MAMMAPLASTY Bilateral 1990   saline submuscular   BACK SURGERY  1989   BILATERAL TOTAL MASTECTOMY WITH AXILLARY LYMPH NODE DISSECTION  1990   BREAST IMPLANT EXCHANGE Bilateral 05/11/2016   Procedure: REMOVAL AND REPLACEMENT OF BREAST IMPLANTS ;  Surgeon: Glenna Fellows, MD;  Location: [facility];  Service: Plastics;  Laterality: Bilateral;   CAPSULECTOMY Bilateral 05/11/2016   Procedure: CAPSULECTOMY CAPSULORRAPHY ;  Surgeon: Glenna Fellows, MD;  Location: [facility];  Service: Plastics;  Laterality: Bilateral;   CATARACT EXTRACTION W/PHACO Right 12/26/2018   Procedure: CATARACT EXTRACTION PHACO AND INTRAOCULAR LENS PLACEMENT (IOC) RIGHT;  Surgeon: Galen Manila, MD;  Location: [facility];  Service: Ophthalmology;  Laterality: Right;  [place]  00:46 CDE 8.50 Fluid pack lot # [id number]   CATARACT EXTRACTION W/PHACO Left 01/23/2019   Procedure: CATARACT EXTRACTION PHACO AND INTRAOCULAR LENS PLACEMENT (IOC) LEFT;  Surgeon: Galen Manila, MD;  Location: [facility];  Service: Ophthalmology;  Laterality: Left;  [place]  00:45 CDE  8.41 fluid pack lot # [id number]   CHOLECYSTECTOMY  1979   COLONOSCOPY WITH PROPOFOL N/A 01/25/2018   Procedure: COLONOSCOPY WITH PROPOFOL;  Surgeon: Scot Jun, MD;  Location: [facility];  Service: Endoscopy;  Laterality: N/A;   COPD     ESOPHAGOGASTRODUODENOSCOPY (EGD) WITH PROPOFOL N/A 03/07/2018   Procedure: ESOPHAGOGASTRODUODENOSCOPY (EGD) WITH PROPOFOL;  Surgeon: Scot Jun, MD;  Location: [facility];  Service: Endoscopy;  Laterality: N/A;   FRACTURE SURGERY     HARDWARE REMOVAL Left 06/18/2020   Procedure: HARDWARE REMOVAL;  Surgeon: Lyndle Herrlich, MD;  Location: [facility];  Service: Orthopedics;  Laterality: Left;   HUMERUS  IM NAIL Left 12/31/2019   Procedure: INTRAMEDULLARY (IM) NAIL HUMERAL;  Surgeon: Lyndle Herrlich, MD;  Location: [facility];  Service: Orthopedics;  Laterality: Left;   LIPOSUCTION Right 05/11/2016   Procedure: LIPOSUCTION;  Surgeon: Glenna Fellows, MD;  Location: [facility];  Service: Plastics;  Laterality: Right;   LIVER BIOPSY  2011  MASTECTOMY SUBCUTANEOUS Bilateral 1990   MASTOPEXY Bilateral 05/11/2016   Procedure: MASTOPEXY BILATERAL ;  Surgeon: Glenna Fellows, MD;  Location: [facility];  Service: Plastics;  Laterality: Bilateral;    Family History  Problem Relation Age of Onset   Atrial fibrillation Mother    Atrial fibrillation Sister    Cancer Sister    Diabetes Sister    Breast cancer Sister 62   Diabetes Father    Cancer Sister    Diabetes Sister    Atrial fibrillation Sister    Kidney disease Maternal Aunt     Allergies  Allergen Reactions   Codeine Anaphylaxis and Hives    Reaction:  Unknown    Demerol [Meperidine] Anaphylaxis and Hives    Reaction:  Unknown     Latex Anaphylaxis and Rash   Morphine And Related Hives   Ciprofloxacin Nausea And Vomiting   Doxycycline Nausea And Vomiting   Flagyl [Metronidazole] Nausea And Vomiting    Reaction:  Unknown    Fluconazole Hives   Influenza Vaccines Swelling    Localized swelling   Tape Other (See Comments)    Pt allergic to Adhesive tape and latex.(  Skin breaks out)   Valtrex [Valacyclovir Hcl] Hives   Duloxetine Hcl Other (See Comments)    Reaction:  Unknown    Amoxicillin-Pot Clavulanate Nausea And Vomiting   Sulfamethoxazole-Trimethoprim Nausea And Vomiting   Valacyclovir Rash       Latest Ref Rng & Units 05/04/2022    4:16 AM 05/03/2022    2:16 AM 05/02/2022    4:16 AM  CBC  WBC 4.0 - 10.5 K/uL 7.5  8.7  6.9   Hemoglobin 12.0 - 15.0 g/dL 62.6  94.8  54.6   Hematocrit 36.0 - 46.0 % 35.3  34.3  37.6   Platelets 150 - 400 K/uL 204  194  193       CMP     Component  Value Date/Time   NA 138 05/04/2022 0416   NA 141 04/29/2015 1416   NA 145 02/29/2012 1316   K 3.9 05/04/2022 0416   K 4.1 02/29/2012 0057   CL 107 05/04/2022 0416   CL 110 (H) 02/29/2012 0057   CO2 24 05/04/2022 0416   CO2 25 02/29/2012 0057   GLUCOSE 102 (H) 05/04/2022 0416   GLUCOSE 105 (H) 02/29/2012 0057   BUN 31 (H) 05/04/2022 0416   BUN 24 04/29/2015 1416   BUN 22 (H) 02/29/2012 0057   CREATININE 1.15 (H) 05/04/2022 0416   CREATININE 0.97 02/29/2012 0057   CALCIUM 8.6 (L) 05/04/2022 0416   CALCIUM 8.1 (L) 02/29/2012 0057   PROT 7.8 04/28/2022 1220   PROT 7.8 02/28/2012 0905   ALBUMIN 3.9 04/28/2022 1220   ALBUMIN 4.1 02/28/2012 0905   AST 41 04/28/2022 1220   AST 28 02/28/2012 0905   ALT 22 04/28/2022 1220   ALT 28 02/28/2012 0905   ALKPHOS 470 (H) 04/28/2022 1220   ALKPHOS 129 02/28/2012 0905   BILITOT 0.4 04/28/2022 1220   BILITOT 0.5 02/28/2012 0905   GFRNONAA 49 (L) 05/04/2022 0416   GFRNONAA >60 02/29/2012 0057   GFRAA 51 (L) 06/17/2020 0952   GFRAA >60 02/29/2012 0057     No results found.     Assessment & Plan:   1. Venous insufficiency Recommend:  I have had a long discussion with the patient regarding swelling and why it  causes symptoms.  Patient will begin wearing graduated compression on a daily basis  a prescription was given. The patient will  wear the stockings first thing in the morning and removing them in the evening. The patient is instructed specifically not to sleep in the stockings.   In addition, behavioral modification will be initiated.  This will include frequent elevation, use of over the counter pain medications and exercise such as walking.  Consideration for a lymph pump will also be made based upon the effectiveness of conservative therapy.  This would help to improve the edema control and prevent sequela such as ulcers and infections   Patient should undergo duplex ultrasound of the venous system to ensure that DVT or reflux is  not present.  The patient will follow-up with me after the ultrasound.    2. Abnormal ankle brachial index (ABI) The patient does have abnormal ABIs on the right however only the PT seems to be evaluated.  There is also no notable waveforms except for in the toes.  We will reevaluate the patient's ABIs as well as with limited arterial duplexes in order to better ascertain her vascular status and if intervention is necessary or if this pain discomfort may be related to her known back pain.   Current Outpatient Medications on File Prior to Visit  Medication Sig Dispense Refill   alum & mag hydroxide-simeth (MAALOX/MYLANTA) 200-200-20 MG/5ML suspension Take 15 mLs by mouth every 6 (six) hours as needed for indigestion or heartburn.     aspirin EC 81 MG tablet Take 1 tablet (81 mg total) by mouth daily. Swallow whole. 30 tablet 0   butorphanol (STADOL) 10 MG/ML nasal spray Place 1 spray into the nose every 4 (four) hours as needed for headache. 15 mL 0   calcium carbonate (TUMS - DOSED IN MG ELEMENTAL CALCIUM) 500 MG chewable tablet Chew 1 tablet by mouth 3 (three) times daily as needed for indigestion or heartburn.     Cholecalciferol (DIALYVITE VITAMIN D3 MAX) 1.25 MG (50000 UT) TABS Take 50,000 mcg by mouth once a week. Sunday      Cyanocobalamin (VITAMIN B-12 IJ) Inject 1,000 mg as directed every 30 (thirty) days.     cyclobenzaprine (FLEXERIL) 5 MG tablet cyclobenzaprine 5 mg tablet  Take 1 tablet twice a day by oral route for 30 days.     fluticasone (FLONASE) 50 MCG/ACT nasal spray Place 2 sprays into both nostrils daily.     hydrochlorothiazide (HYDRODIURIL) 12.5 MG tablet Take 1 tablet (12.5 mg total) by mouth 2 (two) times daily. Hold this med for MAP <65 and/or SBP < 110 60 tablet 5   olmesartan (BENICAR) 40 MG tablet Take 1 tablet (40 mg total) by mouth every morning. Hold this med for MAP <65 and/or SBP < 110     rosuvastatin (CRESTOR) 10 MG tablet Take 10 mg by mouth daily.      torsemide (DEMADEX) 20 MG tablet Take 1 tablet (20 mg total) by mouth daily as needed (fluid). Hold this med for MAP <65 and/or SBP < 110     zolpidem (AMBIEN) 10 MG tablet Take 1 tablet (10 mg total) by mouth at bedtime for 1 day. 1 tablet 0   No current facility-administered medications on file prior to visit.    There are no Patient Instructions on file for this visit. No follow-ups on file.   Georgiana Spinner, NP

## 2022-06-08 DIAGNOSIS — Z87891 Personal history of nicotine dependence: Secondary | ICD-10-CM | POA: Diagnosis not present

## 2022-06-08 DIAGNOSIS — F32A Depression, unspecified: Secondary | ICD-10-CM | POA: Diagnosis not present

## 2022-06-08 DIAGNOSIS — G43909 Migraine, unspecified, not intractable, without status migrainosus: Secondary | ICD-10-CM | POA: Diagnosis not present

## 2022-06-08 DIAGNOSIS — Z7982 Long term (current) use of aspirin: Secondary | ICD-10-CM | POA: Diagnosis not present

## 2022-06-08 DIAGNOSIS — J449 Chronic obstructive pulmonary disease, unspecified: Secondary | ICD-10-CM | POA: Diagnosis not present

## 2022-06-08 DIAGNOSIS — Z9049 Acquired absence of other specified parts of digestive tract: Secondary | ICD-10-CM | POA: Diagnosis not present

## 2022-06-08 DIAGNOSIS — N179 Acute kidney failure, unspecified: Secondary | ICD-10-CM | POA: Diagnosis not present

## 2022-06-08 DIAGNOSIS — Z8673 Personal history of transient ischemic attack (TIA), and cerebral infarction without residual deficits: Secondary | ICD-10-CM | POA: Diagnosis not present

## 2022-06-08 DIAGNOSIS — Z853 Personal history of malignant neoplasm of breast: Secondary | ICD-10-CM | POA: Diagnosis not present

## 2022-06-08 DIAGNOSIS — F411 Generalized anxiety disorder: Secondary | ICD-10-CM | POA: Diagnosis not present

## 2022-06-08 DIAGNOSIS — Z90710 Acquired absence of both cervix and uterus: Secondary | ICD-10-CM | POA: Diagnosis not present

## 2022-06-08 DIAGNOSIS — K219 Gastro-esophageal reflux disease without esophagitis: Secondary | ICD-10-CM | POA: Diagnosis not present

## 2022-06-08 DIAGNOSIS — G894 Chronic pain syndrome: Secondary | ICD-10-CM | POA: Diagnosis not present

## 2022-06-08 DIAGNOSIS — I5032 Chronic diastolic (congestive) heart failure: Secondary | ICD-10-CM | POA: Diagnosis not present

## 2022-06-08 DIAGNOSIS — M1990 Unspecified osteoarthritis, unspecified site: Secondary | ICD-10-CM | POA: Diagnosis not present

## 2022-06-08 DIAGNOSIS — N1831 Chronic kidney disease, stage 3a: Secondary | ICD-10-CM | POA: Diagnosis not present

## 2022-06-08 DIAGNOSIS — I130 Hypertensive heart and chronic kidney disease with heart failure and stage 1 through stage 4 chronic kidney disease, or unspecified chronic kidney disease: Secondary | ICD-10-CM | POA: Diagnosis not present

## 2022-06-08 DIAGNOSIS — M329 Systemic lupus erythematosus, unspecified: Secondary | ICD-10-CM | POA: Diagnosis not present

## 2022-06-08 DIAGNOSIS — R4182 Altered mental status, unspecified: Secondary | ICD-10-CM | POA: Diagnosis not present

## 2022-06-08 DIAGNOSIS — Z8616 Personal history of COVID-19: Secondary | ICD-10-CM | POA: Diagnosis not present

## 2022-06-08 DIAGNOSIS — M797 Fibromyalgia: Secondary | ICD-10-CM | POA: Diagnosis not present

## 2022-06-08 DIAGNOSIS — N390 Urinary tract infection, site not specified: Secondary | ICD-10-CM | POA: Diagnosis not present

## 2022-06-08 DIAGNOSIS — G629 Polyneuropathy, unspecified: Secondary | ICD-10-CM | POA: Diagnosis not present

## 2022-06-08 DIAGNOSIS — G9341 Metabolic encephalopathy: Secondary | ICD-10-CM | POA: Diagnosis not present

## 2022-06-11 DIAGNOSIS — N189 Chronic kidney disease, unspecified: Secondary | ICD-10-CM | POA: Diagnosis not present

## 2022-06-11 DIAGNOSIS — E785 Hyperlipidemia, unspecified: Secondary | ICD-10-CM | POA: Diagnosis not present

## 2022-06-11 DIAGNOSIS — I129 Hypertensive chronic kidney disease with stage 1 through stage 4 chronic kidney disease, or unspecified chronic kidney disease: Secondary | ICD-10-CM | POA: Diagnosis not present

## 2022-06-16 DIAGNOSIS — M47817 Spondylosis without myelopathy or radiculopathy, lumbosacral region: Secondary | ICD-10-CM | POA: Diagnosis not present

## 2022-06-18 DIAGNOSIS — G894 Chronic pain syndrome: Secondary | ICD-10-CM | POA: Diagnosis not present

## 2022-06-18 DIAGNOSIS — J449 Chronic obstructive pulmonary disease, unspecified: Secondary | ICD-10-CM | POA: Diagnosis not present

## 2022-06-18 DIAGNOSIS — Z8673 Personal history of transient ischemic attack (TIA), and cerebral infarction without residual deficits: Secondary | ICD-10-CM | POA: Diagnosis not present

## 2022-06-18 DIAGNOSIS — Z9049 Acquired absence of other specified parts of digestive tract: Secondary | ICD-10-CM | POA: Diagnosis not present

## 2022-06-18 DIAGNOSIS — G9341 Metabolic encephalopathy: Secondary | ICD-10-CM | POA: Diagnosis not present

## 2022-06-18 DIAGNOSIS — Z7982 Long term (current) use of aspirin: Secondary | ICD-10-CM | POA: Diagnosis not present

## 2022-06-18 DIAGNOSIS — N390 Urinary tract infection, site not specified: Secondary | ICD-10-CM | POA: Diagnosis not present

## 2022-06-18 DIAGNOSIS — G629 Polyneuropathy, unspecified: Secondary | ICD-10-CM | POA: Diagnosis not present

## 2022-06-18 DIAGNOSIS — Z90710 Acquired absence of both cervix and uterus: Secondary | ICD-10-CM | POA: Diagnosis not present

## 2022-06-18 DIAGNOSIS — R4182 Altered mental status, unspecified: Secondary | ICD-10-CM | POA: Diagnosis not present

## 2022-06-18 DIAGNOSIS — M329 Systemic lupus erythematosus, unspecified: Secondary | ICD-10-CM | POA: Diagnosis not present

## 2022-06-18 DIAGNOSIS — Z87891 Personal history of nicotine dependence: Secondary | ICD-10-CM | POA: Diagnosis not present

## 2022-06-18 DIAGNOSIS — Z8616 Personal history of COVID-19: Secondary | ICD-10-CM | POA: Diagnosis not present

## 2022-06-18 DIAGNOSIS — I130 Hypertensive heart and chronic kidney disease with heart failure and stage 1 through stage 4 chronic kidney disease, or unspecified chronic kidney disease: Secondary | ICD-10-CM | POA: Diagnosis not present

## 2022-06-18 DIAGNOSIS — G43909 Migraine, unspecified, not intractable, without status migrainosus: Secondary | ICD-10-CM | POA: Diagnosis not present

## 2022-06-18 DIAGNOSIS — M1990 Unspecified osteoarthritis, unspecified site: Secondary | ICD-10-CM | POA: Diagnosis not present

## 2022-06-18 DIAGNOSIS — N1831 Chronic kidney disease, stage 3a: Secondary | ICD-10-CM | POA: Diagnosis not present

## 2022-06-18 DIAGNOSIS — F32A Depression, unspecified: Secondary | ICD-10-CM | POA: Diagnosis not present

## 2022-06-18 DIAGNOSIS — N179 Acute kidney failure, unspecified: Secondary | ICD-10-CM | POA: Diagnosis not present

## 2022-06-18 DIAGNOSIS — Z853 Personal history of malignant neoplasm of breast: Secondary | ICD-10-CM | POA: Diagnosis not present

## 2022-06-18 DIAGNOSIS — F411 Generalized anxiety disorder: Secondary | ICD-10-CM | POA: Diagnosis not present

## 2022-06-18 DIAGNOSIS — M797 Fibromyalgia: Secondary | ICD-10-CM | POA: Diagnosis not present

## 2022-06-18 DIAGNOSIS — I5032 Chronic diastolic (congestive) heart failure: Secondary | ICD-10-CM | POA: Diagnosis not present

## 2022-06-18 DIAGNOSIS — K219 Gastro-esophageal reflux disease without esophagitis: Secondary | ICD-10-CM | POA: Diagnosis not present

## 2022-06-21 DIAGNOSIS — E785 Hyperlipidemia, unspecified: Secondary | ICD-10-CM | POA: Diagnosis not present

## 2022-06-24 DIAGNOSIS — M47817 Spondylosis without myelopathy or radiculopathy, lumbosacral region: Secondary | ICD-10-CM | POA: Diagnosis not present

## 2022-06-25 DIAGNOSIS — G4709 Other insomnia: Secondary | ICD-10-CM | POA: Diagnosis not present

## 2022-06-25 DIAGNOSIS — E785 Hyperlipidemia, unspecified: Secondary | ICD-10-CM | POA: Diagnosis not present

## 2022-06-25 DIAGNOSIS — N189 Chronic kidney disease, unspecified: Secondary | ICD-10-CM | POA: Diagnosis not present

## 2022-06-25 DIAGNOSIS — J301 Allergic rhinitis due to pollen: Secondary | ICD-10-CM | POA: Diagnosis not present

## 2022-06-25 DIAGNOSIS — M810 Age-related osteoporosis without current pathological fracture: Secondary | ICD-10-CM | POA: Diagnosis not present

## 2022-06-25 DIAGNOSIS — F411 Generalized anxiety disorder: Secondary | ICD-10-CM | POA: Diagnosis not present

## 2022-06-25 DIAGNOSIS — K219 Gastro-esophageal reflux disease without esophagitis: Secondary | ICD-10-CM | POA: Diagnosis not present

## 2022-06-25 DIAGNOSIS — G8929 Other chronic pain: Secondary | ICD-10-CM | POA: Diagnosis not present

## 2022-06-25 DIAGNOSIS — M7061 Trochanteric bursitis, right hip: Secondary | ICD-10-CM | POA: Diagnosis not present

## 2022-06-25 DIAGNOSIS — I739 Peripheral vascular disease, unspecified: Secondary | ICD-10-CM | POA: Diagnosis not present

## 2022-06-25 DIAGNOSIS — I10 Essential (primary) hypertension: Secondary | ICD-10-CM | POA: Diagnosis not present

## 2022-07-06 ENCOUNTER — Other Ambulatory Visit (INDEPENDENT_AMBULATORY_CARE_PROVIDER_SITE_OTHER): Payer: Self-pay | Admitting: Nurse Practitioner

## 2022-07-06 DIAGNOSIS — I872 Venous insufficiency (chronic) (peripheral): Secondary | ICD-10-CM

## 2022-07-06 DIAGNOSIS — R6889 Other general symptoms and signs: Secondary | ICD-10-CM

## 2022-07-07 ENCOUNTER — Ambulatory Visit (INDEPENDENT_AMBULATORY_CARE_PROVIDER_SITE_OTHER): Payer: PPO | Admitting: Nurse Practitioner

## 2022-07-07 ENCOUNTER — Encounter (INDEPENDENT_AMBULATORY_CARE_PROVIDER_SITE_OTHER): Payer: Self-pay | Admitting: Nurse Practitioner

## 2022-07-07 ENCOUNTER — Ambulatory Visit (INDEPENDENT_AMBULATORY_CARE_PROVIDER_SITE_OTHER): Payer: PPO

## 2022-07-07 VITALS — BP 148/80 | HR 94 | Resp 16 | Wt 126.8 lb

## 2022-07-07 DIAGNOSIS — I872 Venous insufficiency (chronic) (peripheral): Secondary | ICD-10-CM

## 2022-07-07 DIAGNOSIS — R6889 Other general symptoms and signs: Secondary | ICD-10-CM

## 2022-07-07 DIAGNOSIS — I890 Lymphedema, not elsewhere classified: Secondary | ICD-10-CM

## 2022-07-08 DIAGNOSIS — M329 Systemic lupus erythematosus, unspecified: Secondary | ICD-10-CM | POA: Diagnosis not present

## 2022-07-08 DIAGNOSIS — G9341 Metabolic encephalopathy: Secondary | ICD-10-CM | POA: Diagnosis not present

## 2022-07-08 DIAGNOSIS — Z8616 Personal history of COVID-19: Secondary | ICD-10-CM | POA: Diagnosis not present

## 2022-07-08 DIAGNOSIS — I130 Hypertensive heart and chronic kidney disease with heart failure and stage 1 through stage 4 chronic kidney disease, or unspecified chronic kidney disease: Secondary | ICD-10-CM | POA: Diagnosis not present

## 2022-07-08 DIAGNOSIS — G43909 Migraine, unspecified, not intractable, without status migrainosus: Secondary | ICD-10-CM | POA: Diagnosis not present

## 2022-07-08 DIAGNOSIS — Z90710 Acquired absence of both cervix and uterus: Secondary | ICD-10-CM | POA: Diagnosis not present

## 2022-07-08 DIAGNOSIS — Z7982 Long term (current) use of aspirin: Secondary | ICD-10-CM | POA: Diagnosis not present

## 2022-07-08 DIAGNOSIS — Z87891 Personal history of nicotine dependence: Secondary | ICD-10-CM | POA: Diagnosis not present

## 2022-07-08 DIAGNOSIS — F32A Depression, unspecified: Secondary | ICD-10-CM | POA: Diagnosis not present

## 2022-07-08 DIAGNOSIS — R4182 Altered mental status, unspecified: Secondary | ICD-10-CM | POA: Diagnosis not present

## 2022-07-08 DIAGNOSIS — G629 Polyneuropathy, unspecified: Secondary | ICD-10-CM | POA: Diagnosis not present

## 2022-07-08 DIAGNOSIS — J449 Chronic obstructive pulmonary disease, unspecified: Secondary | ICD-10-CM | POA: Diagnosis not present

## 2022-07-08 DIAGNOSIS — F411 Generalized anxiety disorder: Secondary | ICD-10-CM | POA: Diagnosis not present

## 2022-07-08 DIAGNOSIS — G894 Chronic pain syndrome: Secondary | ICD-10-CM | POA: Diagnosis not present

## 2022-07-08 DIAGNOSIS — I5032 Chronic diastolic (congestive) heart failure: Secondary | ICD-10-CM | POA: Diagnosis not present

## 2022-07-08 DIAGNOSIS — M1990 Unspecified osteoarthritis, unspecified site: Secondary | ICD-10-CM | POA: Diagnosis not present

## 2022-07-08 DIAGNOSIS — N179 Acute kidney failure, unspecified: Secondary | ICD-10-CM | POA: Diagnosis not present

## 2022-07-08 DIAGNOSIS — M797 Fibromyalgia: Secondary | ICD-10-CM | POA: Diagnosis not present

## 2022-07-08 DIAGNOSIS — Z9049 Acquired absence of other specified parts of digestive tract: Secondary | ICD-10-CM | POA: Diagnosis not present

## 2022-07-08 DIAGNOSIS — N390 Urinary tract infection, site not specified: Secondary | ICD-10-CM | POA: Diagnosis not present

## 2022-07-08 DIAGNOSIS — K219 Gastro-esophageal reflux disease without esophagitis: Secondary | ICD-10-CM | POA: Diagnosis not present

## 2022-07-08 DIAGNOSIS — Z853 Personal history of malignant neoplasm of breast: Secondary | ICD-10-CM | POA: Diagnosis not present

## 2022-07-08 DIAGNOSIS — N1831 Chronic kidney disease, stage 3a: Secondary | ICD-10-CM | POA: Diagnosis not present

## 2022-07-08 DIAGNOSIS — Z8673 Personal history of transient ischemic attack (TIA), and cerebral infarction without residual deficits: Secondary | ICD-10-CM | POA: Diagnosis not present

## 2022-07-09 DIAGNOSIS — N1831 Chronic kidney disease, stage 3a: Secondary | ICD-10-CM | POA: Diagnosis not present

## 2022-07-09 DIAGNOSIS — Z7982 Long term (current) use of aspirin: Secondary | ICD-10-CM | POA: Diagnosis not present

## 2022-07-09 DIAGNOSIS — Z853 Personal history of malignant neoplasm of breast: Secondary | ICD-10-CM | POA: Diagnosis not present

## 2022-07-09 DIAGNOSIS — K219 Gastro-esophageal reflux disease without esophagitis: Secondary | ICD-10-CM | POA: Diagnosis not present

## 2022-07-09 DIAGNOSIS — Z87891 Personal history of nicotine dependence: Secondary | ICD-10-CM | POA: Diagnosis not present

## 2022-07-09 DIAGNOSIS — G43909 Migraine, unspecified, not intractable, without status migrainosus: Secondary | ICD-10-CM | POA: Diagnosis not present

## 2022-07-09 DIAGNOSIS — Z8616 Personal history of COVID-19: Secondary | ICD-10-CM | POA: Diagnosis not present

## 2022-07-09 DIAGNOSIS — Z8673 Personal history of transient ischemic attack (TIA), and cerebral infarction without residual deficits: Secondary | ICD-10-CM | POA: Diagnosis not present

## 2022-07-09 DIAGNOSIS — G629 Polyneuropathy, unspecified: Secondary | ICD-10-CM | POA: Diagnosis not present

## 2022-07-09 DIAGNOSIS — F32A Depression, unspecified: Secondary | ICD-10-CM | POA: Diagnosis not present

## 2022-07-09 DIAGNOSIS — M329 Systemic lupus erythematosus, unspecified: Secondary | ICD-10-CM | POA: Diagnosis not present

## 2022-07-09 DIAGNOSIS — M797 Fibromyalgia: Secondary | ICD-10-CM | POA: Diagnosis not present

## 2022-07-09 DIAGNOSIS — M1990 Unspecified osteoarthritis, unspecified site: Secondary | ICD-10-CM | POA: Diagnosis not present

## 2022-07-09 DIAGNOSIS — F411 Generalized anxiety disorder: Secondary | ICD-10-CM | POA: Diagnosis not present

## 2022-07-09 DIAGNOSIS — J449 Chronic obstructive pulmonary disease, unspecified: Secondary | ICD-10-CM | POA: Diagnosis not present

## 2022-07-09 DIAGNOSIS — R4182 Altered mental status, unspecified: Secondary | ICD-10-CM | POA: Diagnosis not present

## 2022-07-09 DIAGNOSIS — Z9049 Acquired absence of other specified parts of digestive tract: Secondary | ICD-10-CM | POA: Diagnosis not present

## 2022-07-09 DIAGNOSIS — N390 Urinary tract infection, site not specified: Secondary | ICD-10-CM | POA: Diagnosis not present

## 2022-07-09 DIAGNOSIS — N179 Acute kidney failure, unspecified: Secondary | ICD-10-CM | POA: Diagnosis not present

## 2022-07-09 DIAGNOSIS — I5032 Chronic diastolic (congestive) heart failure: Secondary | ICD-10-CM | POA: Diagnosis not present

## 2022-07-09 DIAGNOSIS — G9341 Metabolic encephalopathy: Secondary | ICD-10-CM | POA: Diagnosis not present

## 2022-07-09 DIAGNOSIS — Z90710 Acquired absence of both cervix and uterus: Secondary | ICD-10-CM | POA: Diagnosis not present

## 2022-07-09 DIAGNOSIS — I130 Hypertensive heart and chronic kidney disease with heart failure and stage 1 through stage 4 chronic kidney disease, or unspecified chronic kidney disease: Secondary | ICD-10-CM | POA: Diagnosis not present

## 2022-07-09 DIAGNOSIS — G894 Chronic pain syndrome: Secondary | ICD-10-CM | POA: Diagnosis not present

## 2022-07-12 DIAGNOSIS — N189 Chronic kidney disease, unspecified: Secondary | ICD-10-CM | POA: Diagnosis not present

## 2022-07-12 DIAGNOSIS — E785 Hyperlipidemia, unspecified: Secondary | ICD-10-CM | POA: Diagnosis not present

## 2022-07-12 DIAGNOSIS — I129 Hypertensive chronic kidney disease with stage 1 through stage 4 chronic kidney disease, or unspecified chronic kidney disease: Secondary | ICD-10-CM | POA: Diagnosis not present

## 2022-07-14 DIAGNOSIS — G43909 Migraine, unspecified, not intractable, without status migrainosus: Secondary | ICD-10-CM | POA: Diagnosis not present

## 2022-07-14 DIAGNOSIS — K219 Gastro-esophageal reflux disease without esophagitis: Secondary | ICD-10-CM | POA: Diagnosis not present

## 2022-07-14 DIAGNOSIS — G894 Chronic pain syndrome: Secondary | ICD-10-CM | POA: Diagnosis not present

## 2022-07-14 DIAGNOSIS — I130 Hypertensive heart and chronic kidney disease with heart failure and stage 1 through stage 4 chronic kidney disease, or unspecified chronic kidney disease: Secondary | ICD-10-CM | POA: Diagnosis not present

## 2022-07-14 DIAGNOSIS — N1831 Chronic kidney disease, stage 3a: Secondary | ICD-10-CM | POA: Diagnosis not present

## 2022-07-14 DIAGNOSIS — N179 Acute kidney failure, unspecified: Secondary | ICD-10-CM | POA: Diagnosis not present

## 2022-07-14 DIAGNOSIS — N390 Urinary tract infection, site not specified: Secondary | ICD-10-CM | POA: Diagnosis not present

## 2022-07-14 DIAGNOSIS — Z87891 Personal history of nicotine dependence: Secondary | ICD-10-CM | POA: Diagnosis not present

## 2022-07-14 DIAGNOSIS — I5032 Chronic diastolic (congestive) heart failure: Secondary | ICD-10-CM | POA: Diagnosis not present

## 2022-07-14 DIAGNOSIS — Z90710 Acquired absence of both cervix and uterus: Secondary | ICD-10-CM | POA: Diagnosis not present

## 2022-07-14 DIAGNOSIS — F32A Depression, unspecified: Secondary | ICD-10-CM | POA: Diagnosis not present

## 2022-07-14 DIAGNOSIS — F411 Generalized anxiety disorder: Secondary | ICD-10-CM | POA: Diagnosis not present

## 2022-07-14 DIAGNOSIS — Z9049 Acquired absence of other specified parts of digestive tract: Secondary | ICD-10-CM | POA: Diagnosis not present

## 2022-07-14 DIAGNOSIS — M797 Fibromyalgia: Secondary | ICD-10-CM | POA: Diagnosis not present

## 2022-07-14 DIAGNOSIS — G9341 Metabolic encephalopathy: Secondary | ICD-10-CM | POA: Diagnosis not present

## 2022-07-14 DIAGNOSIS — M329 Systemic lupus erythematosus, unspecified: Secondary | ICD-10-CM | POA: Diagnosis not present

## 2022-07-14 DIAGNOSIS — J449 Chronic obstructive pulmonary disease, unspecified: Secondary | ICD-10-CM | POA: Diagnosis not present

## 2022-07-14 DIAGNOSIS — G629 Polyneuropathy, unspecified: Secondary | ICD-10-CM | POA: Diagnosis not present

## 2022-07-14 DIAGNOSIS — Z853 Personal history of malignant neoplasm of breast: Secondary | ICD-10-CM | POA: Diagnosis not present

## 2022-07-14 DIAGNOSIS — M1990 Unspecified osteoarthritis, unspecified site: Secondary | ICD-10-CM | POA: Diagnosis not present

## 2022-07-14 DIAGNOSIS — Z7982 Long term (current) use of aspirin: Secondary | ICD-10-CM | POA: Diagnosis not present

## 2022-07-14 DIAGNOSIS — Z8673 Personal history of transient ischemic attack (TIA), and cerebral infarction without residual deficits: Secondary | ICD-10-CM | POA: Diagnosis not present

## 2022-07-14 DIAGNOSIS — Z8616 Personal history of COVID-19: Secondary | ICD-10-CM | POA: Diagnosis not present

## 2022-07-14 DIAGNOSIS — R4182 Altered mental status, unspecified: Secondary | ICD-10-CM | POA: Diagnosis not present

## 2022-07-16 DIAGNOSIS — M7542 Impingement syndrome of left shoulder: Secondary | ICD-10-CM | POA: Diagnosis not present

## 2022-07-19 DIAGNOSIS — M1990 Unspecified osteoarthritis, unspecified site: Secondary | ICD-10-CM | POA: Diagnosis not present

## 2022-07-19 DIAGNOSIS — Z8673 Personal history of transient ischemic attack (TIA), and cerebral infarction without residual deficits: Secondary | ICD-10-CM | POA: Diagnosis not present

## 2022-07-19 DIAGNOSIS — Z8616 Personal history of COVID-19: Secondary | ICD-10-CM | POA: Diagnosis not present

## 2022-07-19 DIAGNOSIS — N390 Urinary tract infection, site not specified: Secondary | ICD-10-CM | POA: Diagnosis not present

## 2022-07-19 DIAGNOSIS — M329 Systemic lupus erythematosus, unspecified: Secondary | ICD-10-CM | POA: Diagnosis not present

## 2022-07-19 DIAGNOSIS — Z9049 Acquired absence of other specified parts of digestive tract: Secondary | ICD-10-CM | POA: Diagnosis not present

## 2022-07-19 DIAGNOSIS — F32A Depression, unspecified: Secondary | ICD-10-CM | POA: Diagnosis not present

## 2022-07-19 DIAGNOSIS — M797 Fibromyalgia: Secondary | ICD-10-CM | POA: Diagnosis not present

## 2022-07-19 DIAGNOSIS — Z87891 Personal history of nicotine dependence: Secondary | ICD-10-CM | POA: Diagnosis not present

## 2022-07-19 DIAGNOSIS — G9341 Metabolic encephalopathy: Secondary | ICD-10-CM | POA: Diagnosis not present

## 2022-07-19 DIAGNOSIS — I130 Hypertensive heart and chronic kidney disease with heart failure and stage 1 through stage 4 chronic kidney disease, or unspecified chronic kidney disease: Secondary | ICD-10-CM | POA: Diagnosis not present

## 2022-07-19 DIAGNOSIS — G894 Chronic pain syndrome: Secondary | ICD-10-CM | POA: Diagnosis not present

## 2022-07-19 DIAGNOSIS — G43909 Migraine, unspecified, not intractable, without status migrainosus: Secondary | ICD-10-CM | POA: Diagnosis not present

## 2022-07-19 DIAGNOSIS — Z853 Personal history of malignant neoplasm of breast: Secondary | ICD-10-CM | POA: Diagnosis not present

## 2022-07-19 DIAGNOSIS — Z7982 Long term (current) use of aspirin: Secondary | ICD-10-CM | POA: Diagnosis not present

## 2022-07-19 DIAGNOSIS — R4182 Altered mental status, unspecified: Secondary | ICD-10-CM | POA: Diagnosis not present

## 2022-07-19 DIAGNOSIS — N179 Acute kidney failure, unspecified: Secondary | ICD-10-CM | POA: Diagnosis not present

## 2022-07-19 DIAGNOSIS — I5032 Chronic diastolic (congestive) heart failure: Secondary | ICD-10-CM | POA: Diagnosis not present

## 2022-07-19 DIAGNOSIS — J449 Chronic obstructive pulmonary disease, unspecified: Secondary | ICD-10-CM | POA: Diagnosis not present

## 2022-07-19 DIAGNOSIS — N1831 Chronic kidney disease, stage 3a: Secondary | ICD-10-CM | POA: Diagnosis not present

## 2022-07-19 DIAGNOSIS — K219 Gastro-esophageal reflux disease without esophagitis: Secondary | ICD-10-CM | POA: Diagnosis not present

## 2022-07-19 DIAGNOSIS — G629 Polyneuropathy, unspecified: Secondary | ICD-10-CM | POA: Diagnosis not present

## 2022-07-19 DIAGNOSIS — F411 Generalized anxiety disorder: Secondary | ICD-10-CM | POA: Diagnosis not present

## 2022-07-19 DIAGNOSIS — Z90710 Acquired absence of both cervix and uterus: Secondary | ICD-10-CM | POA: Diagnosis not present

## 2022-07-22 DIAGNOSIS — M47819 Spondylosis without myelopathy or radiculopathy, site unspecified: Secondary | ICD-10-CM | POA: Insufficient documentation

## 2022-07-25 ENCOUNTER — Encounter (INDEPENDENT_AMBULATORY_CARE_PROVIDER_SITE_OTHER): Payer: Self-pay | Admitting: Nurse Practitioner

## 2022-07-25 NOTE — Progress Notes (Signed)
Subjective:    Patient ID: Donna Evans, female    DOB: [birth year], 76 y.o.   MRN: [id number] Chief Complaint  Patient presents with   Follow-up    Ultrasound follow up    Donna Evans is a 76 year old female that returns today to evaluate for lower extremity swelling in addition to abnormal ABIs.  The patient has noted chronic pedal edema which has been worse over the last several months.  She was previously taking Demadex without significant improvement.  She also recently started HCTZ which has noted some improvement.  She has notable varicosities bilaterally in the lower extremities.  She reports having right hip pain and discomfort which radiates down her leg.  She also has some numbness and tingling in her right foot as well.  She also has a history of back pain and back issues.  Her noninvasive studies done at her primary care provider notes an ABI 0.63 on the right lower extremity and 1.40 on the left however the test was complicated due to the patient not being able to tolerate the compression of the study.  Today noninvasive studies show an ABI of 1.19 on the right and 1.21 on the left.  The patient has normal TBI's bilaterally she also has triphasic tibial artery waveforms with strong toe waveforms.  She has no evidence of DVT or superficial thrombophlebitis bilaterally no evidence of deep venous insufficiency or superficial venous reflux bilaterally.    Review of Systems  Cardiovascular:  Positive for leg swelling.  All other systems reviewed and are negative.      Objective:   Physical Exam Vitals reviewed.  HENT:     Head: Normocephalic.  Cardiovascular:     Rate and Rhythm: Normal rate.     Pulses:          Dorsalis pedis pulses are detected w/ Doppler on the right side and detected w/ Doppler on the left side.       Posterior tibial pulses are detected w/ Doppler on the right side and detected w/ Doppler on the left side.  Pulmonary:     Effort: Pulmonary effort  is normal.  Musculoskeletal:     Right lower leg: 1+ Edema present.     Left lower leg: 1+ Edema present.  Skin:    General: Skin is warm and dry.     Comments: Dermal thickening bilaterally  Neurological:     Mental Status: She is alert and oriented to person, place, and time.  Psychiatric:        Mood and Affect: Mood normal.        Behavior: Behavior normal.        Thought Content: Thought content normal.        Judgment: Judgment normal.     BP (!) 148/80 (BP Location: Right Arm)   Pulse 94   Resp 16   Wt 126 lb 12.8 oz (57.5 kg)   BMI 19.86 kg/m   Past Medical History:  Diagnosis Date   Allergic state    Anginal pain (HCC)    Prinzmetal's angina   Anxiety    Arthritis    osteoarthritis   Breast cancer ([place]) 1990   right breast cancer   Chronic pain    Chronic pain    COPD (chronic obstructive pulmonary disease) (HCC)    Depression    Edema    Fibromyalgia    Foot fracture    Bilateral   GERD (gastroesophageal reflux disease)  History of kidney stones    Hypertension    Leg fracture, right    Low back pain    Lumbosacral neuritis    Medulloadrenal hyperfunction (HCC)    Migraine headache    Peripheral neuropathy    Shoulder fracture, right    Stroke (HCC)    TIA   Systemic lupus erythematosus (HCC)    Thyroid disease    TIA (transient ischemic attack) 11/28/2013   Transient global amnesia 2011    Social History   Socioeconomic History   Marital status: Married    Spouse name: Not on file   Number of children: 1   Years of education: college3   Highest education level: Not on file  Occupational History   Occupation: Retired  Tobacco Use   Smoking status: Former    Packs/day: 1.00    Years: 40.00    Total pack years: 40.00    Types: Cigarettes    Quit date: 08/28/2005    Years since quitting: 16.9   Smokeless tobacco: Never  Vaping Use   Vaping Use: Never used  Substance and Sexual Activity   Alcohol use: Not Currently     Alcohol/week: 2.0 standard drinks of alcohol    Types: 2 Shots of liquor per week   Drug use: No   Sexual activity: Never  Other Topics Concern   Not on file  Social History Narrative   Not on file   Social Determinants of Health   Financial Resource Strain: Not on file  Food Insecurity: Not on file  Transportation Needs: Not on file  Physical Activity: Not on file  Stress: Not on file  Social Connections: Not on file  Intimate Partner Violence: Not on file    Past Surgical History:  Procedure Laterality Date   [place] Bilateral 1990   saline submuscular   [place] Bilateral 05/11/2016   Procedure: REMOVAL AND REPLACEMENT OF BREAST IMPLANTS ;  Surgeon: Irene Limbo, MD;  Location: [place];  Service: Plastics;  Laterality: Bilateral;   CAPSULECTOMY Bilateral 05/11/2016   Procedure: CAPSULECTOMY CAPSULORRAPHY ;  Surgeon: Irene Limbo, MD;  Location: [place];  Service: Plastics;  Laterality: Bilateral;   CATARACT EXTRACTION W/PHACO Right 12/26/2018   Procedure: CATARACT EXTRACTION PHACO AND INTRAOCULAR LENS PLACEMENT ([place]) RIGHT;  Surgeon: Birder Robson, MD;  Location: [facility];  Service: Ophthalmology;  Laterality: Right;  [place]  00:46 CDE 8.50 Fluid pack lot # [id number]   CATARACT EXTRACTION W/PHACO Left 01/23/2019   Procedure: CATARACT EXTRACTION PHACO AND INTRAOCULAR LENS PLACEMENT ([place]) LEFT;  Surgeon: Birder Robson, MD;  Location: [facility];  Service: Ophthalmology;  Laterality: Left;  [place]  00:45 CDE  8.41 fluid pack lot # [id number]   CHOLECYSTECTOMY  1979   COLONOSCOPY WITH PROPOFOL N/A 01/25/2018   Procedure: COLONOSCOPY WITH PROPOFOL;  Surgeon: Manya Silvas, MD;  Location[facility];  Service: Endoscopy;  Laterality: N/A;   COPD      ESOPHAGOGASTRODUODENOSCOPY (EGD) WITH PROPOFOL N/A 03/07/2018   Procedure: ESOPHAGOGASTRODUODENOSCOPY (EGD) WITH PROPOFOL;  Surgeon: Manya Silvas, MD;  Location: [facility];  Service: Endoscopy;  Laterality: N/A;   FRACTURE SURGERY     HARDWARE REMOVAL Left 06/18/2020   Procedure: HARDWARE REMOVAL;  Surgeon: Lovell Sheehan, MD;  Location: [facility];  Service: Orthopedics;  Laterality: Left;   HUMERUS IM NAIL Left 12/31/2019   Procedure: INTRAMEDULLARY (IM) NAIL HUMERAL;  Surgeon: Lovell Sheehan, MD;  Location: [facility];  Service: Orthopedics;  Laterality: Left;   LIPOSUCTION Right 05/11/2016   Procedure: LIPOSUCTION;  Surgeon: Irene Limbo, MD;  Location: [place];  Service: Plastics;  Laterality: Right;   LIVER BIOPSY  2011   MASTECTOMY SUBCUTANEOUS Bilateral 1990   MASTOPEXY Bilateral 05/11/2016   Procedure: MASTOPEXY BILATERAL ;  Surgeon: Irene Limbo, MD;  Location: [place];  Service: Plastics;  Laterality: Bilateral;    Family History  Problem Relation Age of Onset   Atrial fibrillation Mother    Atrial fibrillation Sister    Cancer Sister    Diabetes Sister    Breast cancer Sister 56   Diabetes Father    Cancer Sister    Diabetes Sister    Atrial fibrillation Sister    Kidney disease Maternal Aunt     Allergies  Allergen Reactions   Codeine Anaphylaxis and Hives    Reaction:  Unknown    Demerol [Meperidine] Anaphylaxis and Hives    Reaction:  Unknown     Latex Anaphylaxis and Rash   Morphine And Related Hives   Ciprofloxacin Nausea And Vomiting   Doxycycline Nausea And Vomiting   Flagyl [Metronidazole] Nausea And Vomiting    Reaction:  Unknown    Fluconazole Hives   Influenza Vaccines Swelling    Localized swelling   Tape Other (See Comments)    Pt allergic to Adhesive tape and latex.(  Skin breaks out)   Valtrex [Valacyclovir Hcl] Hives   Duloxetine Hcl Other (See Comments)    Reaction:  Unknown    Amoxicillin-Pot  Clavulanate Nausea And Vomiting   Sulfamethoxazole-Trimethoprim Nausea And Vomiting   Valacyclovir Rash       Latest Ref Rng & Units 05/04/2022    4:16 AM 05/03/2022    2:16 AM 05/02/2022    4:16 AM  CBC  WBC 4.0 - 10.5 K/uL 7.5  8.7  6.9   Hemoglobin 12.0 - 15.0 g/dL 11.4  11.3  12.2   Hematocrit 36.0 - 46.0 % 35.3  34.3  37.6   Platelets 150 - 400 K/uL 204  194  193       CMP     Component Value Date/Time   NA 138 05/04/2022 0416   NA 141 04/29/2015 1416   NA 145 02/29/2012 1316   K 3.9 05/04/2022 0416   K 4.1 02/29/2012 0057   CL 107 05/04/2022 0416   CL 110 (H) 02/29/2012 0057   CO2 24 05/04/2022 0416   CO2 25 02/29/2012 0057   GLUCOSE 102 (H) 05/04/2022 0416   GLUCOSE 105 (H) 02/29/2012 0057   BUN 31 (H) 05/04/2022 0416   BUN 24 04/29/2015 1416   BUN 22 (H) 02/29/2012 0057   CREATININE 1.15 (H) 05/04/2022 0416   CREATININE 0.97 02/29/2012 0057   CALCIUM 8.6 (L) 05/04/2022 0416   CALCIUM 8.1 (L) 02/29/2012 0057   PROT 7.8 04/28/2022 1220   PROT 7.8 02/28/2012 0905   ALBUMIN 3.9 04/28/2022 1220   ALBUMIN 4.1 02/28/2012 0905   AST 41 04/28/2022 1220   AST 28 02/28/2012 0905   ALT 22 04/28/2022 1220   ALT 28 02/28/2012 0905   ALKPHOS 470 (H) 04/28/2022 1220   ALKPHOS 129 02/28/2012 0905   BILITOT 0.4 04/28/2022 1220   BILITOT 0.5 02/28/2012 0905   GFRNONAA 49 (L) 05/04/2022 0416  GFRNONAA >60 02/29/2012 0057   GFRAA 51 (L) 06/17/2020 0952   GFRAA >60 02/29/2012 0057     VAS [place] ABI WITH/WO TBI  Result Date: 07/12/2022  LOWER EXTREMITY DOPPLER STUDY Patient Name:  ANYSHA FRAPPIER  Date of Exam:   07/07/2022 Medical Rec #: [id number]       Accession #:    [id number] Date of Birth: February 07, 1946        Patient Gender: F Patient Age:   37 years Exam Location:  [facility] Procedure:      VAS [place] ABI WITH/WO TBI Referring Phys: Hortencia Pilar --------------------------------------------------------------------------------  Indications: Abnormal ABI's at  outside facility.  Performing Technologist: Almira Coaster RVS  Examination Guidelines: A complete evaluation includes at minimum, Doppler waveform signals and systolic blood pressure reading at the level of bilateral brachial, anterior tibial, and posterior tibial arteries, when vessel segments are accessible. Bilateral testing is considered an integral part of a complete examination. Photoelectric Plethysmograph (PPG) waveforms and toe systolic pressure readings are included as required and additional duplex testing as needed. Limited examinations for reoccurring indications may be performed as noted.  ABI Findings: +---------+------------------+-----+---------+--------+ Right    Rt Pressure (mmHg)IndexWaveform Comment  +---------+------------------+-----+---------+--------+ Brachial 136                                      +---------+------------------+-----+---------+--------+ ATA      162               1.19 triphasic         +---------+------------------+-----+---------+--------+ PTA      154               1.13 triphasic         +---------+------------------+-----+---------+--------+ Great Toe135               0.99 Normal            +---------+------------------+-----+---------+--------+ +---------+------------------+-----+---------+-------+ Left     Lt Pressure (mmHg)IndexWaveform Comment +---------+------------------+-----+---------+-------+ Brachial 136                                     +---------+------------------+-----+---------+-------+ ATA      165               1.21 triphasic        +---------+------------------+-----+---------+-------+ PTA      147               1.08 triphasic        +---------+------------------+-----+---------+-------+ Great Toe127               0.93 Normal           +---------+------------------+-----+---------+-------+ +-------+-----------+-----------+------------+------------+ ABI/TBIToday's ABIToday's TBIPrevious  ABIPrevious TBI +-------+-----------+-----------+------------+------------+ Right  1.19       .99                                 +-------+-----------+-----------+------------+------------+ Left   1.21       .93                                 +-------+-----------+-----------+------------+------------+  Summary: Right: Resting right ankle-brachial index is within normal range. No evidence of significant right lower extremity arterial disease. The  right toe-brachial index is normal. Left: Resting left ankle-brachial index is within normal range. No evidence of significant left lower extremity arterial disease. The left toe-brachial index is normal.  *See table(s) above for measurements and observations.  Electronically signed by Hortencia Pilar MD on 07/12/2022 at 1:04:34 PM.    Final        Assessment & Plan:   1. Abnormal ankle brachial index (ABI) Today noninvasive studies show no evidence of significant arterial disease.  We will follow-up arterial studies as needed.  2. Lymphedema Recommend:  No surgery or intervention at this point in time.    I have reviewed my discussion with the patient regarding lymphedema and why it  causes symptoms.  Patient will continue wearing graduated compression on a daily basis. The patient should put the compression on first thing in the morning and removing them in the evening. The patient should not sleep in the compression.   In addition, behavioral modification throughout the day will be continued.  This will include frequent elevation (such as in a recliner), use of over the counter pain medications as needed and exercise such as walking.  The systemic causes for chronic edema such as liver, kidney and cardiac etiologies do not appear to have significant changed over the past year.    Despite conservative treatments of at least 4 weeks including graduated compression therapy class 1 and behavioral modification including exercise and elevation  the patient  has not obtained adequate control of the lymphedema.  The patient still has stage 3 lymphedema and therefore, I believe that a lymph pump should be added to improve the control of the patient's lymphedema.  Additionally, a lymph pump is warranted because it will reduce the risk of cellulitis and ulceration in the future.  Patient should follow-up in six months     Current Outpatient Medications on File Prior to Visit  Medication Sig Dispense Refill   alum & mag hydroxide-simeth (MAALOX/MYLANTA) 200-200-20 MG/5ML suspension Take 15 mLs by mouth every 6 (six) hours as needed for indigestion or heartburn.     aspirin EC 81 MG tablet Take 1 tablet (81 mg total) by mouth daily. Swallow whole. 30 tablet 0   butorphanol (STADOL) 10 MG/ML nasal spray Place 1 spray into the nose every 4 (four) hours as needed for headache. 15 mL 0   calcium carbonate (TUMS - DOSED IN MG ELEMENTAL CALCIUM) 500 MG chewable tablet Chew 1 tablet by mouth 3 (three) times daily as needed for indigestion or heartburn.     Cholecalciferol (DIALYVITE VITAMIN D3 MAX) 1.25 MG (50000 UT) TABS Take 50,000 mcg by mouth once a week. Sunday      Cyanocobalamin (VITAMIN B-12 IJ) Inject 1,000 mg as directed every 30 (thirty) days.     cyclobenzaprine (FLEXERIL) 5 MG tablet cyclobenzaprine 5 mg tablet  Take 1 tablet twice a day by oral route for 30 days.     fluticasone (FLONASE) 50 MCG/ACT nasal spray Place 2 sprays into both nostrils daily.     hydrochlorothiazide (HYDRODIURIL) 12.5 MG tablet Take 1 tablet (12.5 mg total) by mouth 2 (two) times daily. Hold this med for MAP <65 and/or SBP < 110 60 tablet 5   olmesartan (BENICAR) 40 MG tablet Take 1 tablet (40 mg total) by mouth every morning. Hold this med for MAP <65 and/or SBP < 110     rosuvastatin (CRESTOR) 10 MG tablet Take 10 mg by mouth daily.     torsemide (DEMADEX) 20 MG tablet  Take 1 tablet (20 mg total) by mouth daily as needed (fluid). Hold this med for MAP <65  and/or SBP < 110     zolpidem (AMBIEN) 10 MG tablet Take 1 tablet (10 mg total) by mouth at bedtime for 1 day. 1 tablet 0   No current facility-administered medications on file prior to visit.    There are no Patient Instructions on file for this visit. No follow-ups on file.   Kris Hartmann, NP

## 2022-07-30 DIAGNOSIS — Z8616 Personal history of COVID-19: Secondary | ICD-10-CM | POA: Diagnosis not present

## 2022-07-30 DIAGNOSIS — N1831 Chronic kidney disease, stage 3a: Secondary | ICD-10-CM | POA: Diagnosis not present

## 2022-07-30 DIAGNOSIS — G43909 Migraine, unspecified, not intractable, without status migrainosus: Secondary | ICD-10-CM | POA: Diagnosis not present

## 2022-07-30 DIAGNOSIS — Z87891 Personal history of nicotine dependence: Secondary | ICD-10-CM | POA: Diagnosis not present

## 2022-07-30 DIAGNOSIS — M797 Fibromyalgia: Secondary | ICD-10-CM | POA: Diagnosis not present

## 2022-07-30 DIAGNOSIS — Z8673 Personal history of transient ischemic attack (TIA), and cerebral infarction without residual deficits: Secondary | ICD-10-CM | POA: Diagnosis not present

## 2022-07-30 DIAGNOSIS — F32A Depression, unspecified: Secondary | ICD-10-CM | POA: Diagnosis not present

## 2022-07-30 DIAGNOSIS — N179 Acute kidney failure, unspecified: Secondary | ICD-10-CM | POA: Diagnosis not present

## 2022-07-30 DIAGNOSIS — I5032 Chronic diastolic (congestive) heart failure: Secondary | ICD-10-CM | POA: Diagnosis not present

## 2022-07-30 DIAGNOSIS — F411 Generalized anxiety disorder: Secondary | ICD-10-CM | POA: Diagnosis not present

## 2022-07-30 DIAGNOSIS — M329 Systemic lupus erythematosus, unspecified: Secondary | ICD-10-CM | POA: Diagnosis not present

## 2022-07-30 DIAGNOSIS — G894 Chronic pain syndrome: Secondary | ICD-10-CM | POA: Diagnosis not present

## 2022-07-30 DIAGNOSIS — N390 Urinary tract infection, site not specified: Secondary | ICD-10-CM | POA: Diagnosis not present

## 2022-07-30 DIAGNOSIS — G629 Polyneuropathy, unspecified: Secondary | ICD-10-CM | POA: Diagnosis not present

## 2022-07-30 DIAGNOSIS — Z90710 Acquired absence of both cervix and uterus: Secondary | ICD-10-CM | POA: Diagnosis not present

## 2022-07-30 DIAGNOSIS — K219 Gastro-esophageal reflux disease without esophagitis: Secondary | ICD-10-CM | POA: Diagnosis not present

## 2022-07-30 DIAGNOSIS — Z853 Personal history of malignant neoplasm of breast: Secondary | ICD-10-CM | POA: Diagnosis not present

## 2022-07-30 DIAGNOSIS — R4182 Altered mental status, unspecified: Secondary | ICD-10-CM | POA: Diagnosis not present

## 2022-07-30 DIAGNOSIS — M1990 Unspecified osteoarthritis, unspecified site: Secondary | ICD-10-CM | POA: Diagnosis not present

## 2022-07-30 DIAGNOSIS — J449 Chronic obstructive pulmonary disease, unspecified: Secondary | ICD-10-CM | POA: Diagnosis not present

## 2022-07-30 DIAGNOSIS — I130 Hypertensive heart and chronic kidney disease with heart failure and stage 1 through stage 4 chronic kidney disease, or unspecified chronic kidney disease: Secondary | ICD-10-CM | POA: Diagnosis not present

## 2022-07-30 DIAGNOSIS — G9341 Metabolic encephalopathy: Secondary | ICD-10-CM | POA: Diagnosis not present

## 2022-07-30 DIAGNOSIS — Z7982 Long term (current) use of aspirin: Secondary | ICD-10-CM | POA: Diagnosis not present

## 2022-07-30 DIAGNOSIS — Z9049 Acquired absence of other specified parts of digestive tract: Secondary | ICD-10-CM | POA: Diagnosis not present

## 2022-08-04 DIAGNOSIS — F32A Depression, unspecified: Secondary | ICD-10-CM | POA: Diagnosis not present

## 2022-08-04 DIAGNOSIS — Z87891 Personal history of nicotine dependence: Secondary | ICD-10-CM | POA: Diagnosis not present

## 2022-08-04 DIAGNOSIS — G9341 Metabolic encephalopathy: Secondary | ICD-10-CM | POA: Diagnosis not present

## 2022-08-04 DIAGNOSIS — I130 Hypertensive heart and chronic kidney disease with heart failure and stage 1 through stage 4 chronic kidney disease, or unspecified chronic kidney disease: Secondary | ICD-10-CM | POA: Diagnosis not present

## 2022-08-04 DIAGNOSIS — G894 Chronic pain syndrome: Secondary | ICD-10-CM | POA: Diagnosis not present

## 2022-08-04 DIAGNOSIS — Z9049 Acquired absence of other specified parts of digestive tract: Secondary | ICD-10-CM | POA: Diagnosis not present

## 2022-08-04 DIAGNOSIS — J449 Chronic obstructive pulmonary disease, unspecified: Secondary | ICD-10-CM | POA: Diagnosis not present

## 2022-08-04 DIAGNOSIS — K219 Gastro-esophageal reflux disease without esophagitis: Secondary | ICD-10-CM | POA: Diagnosis not present

## 2022-08-04 DIAGNOSIS — Z7982 Long term (current) use of aspirin: Secondary | ICD-10-CM | POA: Diagnosis not present

## 2022-08-04 DIAGNOSIS — G629 Polyneuropathy, unspecified: Secondary | ICD-10-CM | POA: Diagnosis not present

## 2022-08-04 DIAGNOSIS — Z8616 Personal history of COVID-19: Secondary | ICD-10-CM | POA: Diagnosis not present

## 2022-08-04 DIAGNOSIS — M797 Fibromyalgia: Secondary | ICD-10-CM | POA: Diagnosis not present

## 2022-08-04 DIAGNOSIS — N179 Acute kidney failure, unspecified: Secondary | ICD-10-CM | POA: Diagnosis not present

## 2022-08-04 DIAGNOSIS — R4182 Altered mental status, unspecified: Secondary | ICD-10-CM | POA: Diagnosis not present

## 2022-08-04 DIAGNOSIS — Z8673 Personal history of transient ischemic attack (TIA), and cerebral infarction without residual deficits: Secondary | ICD-10-CM | POA: Diagnosis not present

## 2022-08-04 DIAGNOSIS — N390 Urinary tract infection, site not specified: Secondary | ICD-10-CM | POA: Diagnosis not present

## 2022-08-04 DIAGNOSIS — Z853 Personal history of malignant neoplasm of breast: Secondary | ICD-10-CM | POA: Diagnosis not present

## 2022-08-04 DIAGNOSIS — G43909 Migraine, unspecified, not intractable, without status migrainosus: Secondary | ICD-10-CM | POA: Diagnosis not present

## 2022-08-04 DIAGNOSIS — Z90710 Acquired absence of both cervix and uterus: Secondary | ICD-10-CM | POA: Diagnosis not present

## 2022-08-04 DIAGNOSIS — N1831 Chronic kidney disease, stage 3a: Secondary | ICD-10-CM | POA: Diagnosis not present

## 2022-08-04 DIAGNOSIS — M329 Systemic lupus erythematosus, unspecified: Secondary | ICD-10-CM | POA: Diagnosis not present

## 2022-08-04 DIAGNOSIS — F411 Generalized anxiety disorder: Secondary | ICD-10-CM | POA: Diagnosis not present

## 2022-08-04 DIAGNOSIS — I5032 Chronic diastolic (congestive) heart failure: Secondary | ICD-10-CM | POA: Diagnosis not present

## 2022-08-04 DIAGNOSIS — M1990 Unspecified osteoarthritis, unspecified site: Secondary | ICD-10-CM | POA: Diagnosis not present

## 2022-08-09 ENCOUNTER — Emergency Department: Payer: PPO

## 2022-08-09 ENCOUNTER — Other Ambulatory Visit: Payer: Self-pay

## 2022-08-09 ENCOUNTER — Inpatient Hospital Stay
Admission: EM | Admit: 2022-08-09 | Discharge: 2022-08-13 | DRG: 683 | Disposition: A | Discharge status: Home Health | Payer: PPO | Attending: Internal Medicine | Admitting: Internal Medicine

## 2022-08-09 DIAGNOSIS — W19XXXA Unspecified fall, initial encounter: Principal | ICD-10-CM

## 2022-08-09 DIAGNOSIS — N1831 Chronic kidney disease, stage 3a: Secondary | ICD-10-CM | POA: Diagnosis present

## 2022-08-09 DIAGNOSIS — I5032 Chronic diastolic (congestive) heart failure: Secondary | ICD-10-CM | POA: Diagnosis not present

## 2022-08-09 DIAGNOSIS — Z882 Allergy status to sulfonamides status: Secondary | ICD-10-CM | POA: Diagnosis not present

## 2022-08-09 DIAGNOSIS — Z87442 Personal history of urinary calculi: Secondary | ICD-10-CM

## 2022-08-09 DIAGNOSIS — E785 Hyperlipidemia, unspecified: Secondary | ICD-10-CM | POA: Diagnosis not present

## 2022-08-09 DIAGNOSIS — K589 Irritable bowel syndrome without diarrhea: Secondary | ICD-10-CM | POA: Diagnosis present

## 2022-08-09 DIAGNOSIS — R0689 Other abnormalities of breathing: Secondary | ICD-10-CM | POA: Diagnosis not present

## 2022-08-09 DIAGNOSIS — M797 Fibromyalgia: Secondary | ICD-10-CM | POA: Diagnosis present

## 2022-08-09 DIAGNOSIS — M4312 Spondylolisthesis, cervical region: Secondary | ICD-10-CM | POA: Diagnosis not present

## 2022-08-09 DIAGNOSIS — D631 Anemia in chronic kidney disease: Secondary | ICD-10-CM | POA: Diagnosis present

## 2022-08-09 DIAGNOSIS — I959 Hypotension, unspecified: Secondary | ICD-10-CM | POA: Diagnosis not present

## 2022-08-09 DIAGNOSIS — R0781 Pleurodynia: Secondary | ICD-10-CM | POA: Diagnosis not present

## 2022-08-09 DIAGNOSIS — G43909 Migraine, unspecified, not intractable, without status migrainosus: Secondary | ICD-10-CM | POA: Diagnosis not present

## 2022-08-09 DIAGNOSIS — J449 Chronic obstructive pulmonary disease, unspecified: Secondary | ICD-10-CM | POA: Diagnosis present

## 2022-08-09 DIAGNOSIS — I129 Hypertensive chronic kidney disease with stage 1 through stage 4 chronic kidney disease, or unspecified chronic kidney disease: Secondary | ICD-10-CM | POA: Diagnosis not present

## 2022-08-09 DIAGNOSIS — I248 Other forms of acute ischemic heart disease: Secondary | ICD-10-CM | POA: Diagnosis not present

## 2022-08-09 DIAGNOSIS — I130 Hypertensive heart and chronic kidney disease with heart failure and stage 1 through stage 4 chronic kidney disease, or unspecified chronic kidney disease: Secondary | ICD-10-CM | POA: Diagnosis present

## 2022-08-09 DIAGNOSIS — R296 Repeated falls: Secondary | ICD-10-CM | POA: Diagnosis not present

## 2022-08-09 DIAGNOSIS — N179 Acute kidney failure, unspecified: Principal | ICD-10-CM

## 2022-08-09 DIAGNOSIS — Z9882 Breast implant status: Secondary | ICD-10-CM

## 2022-08-09 DIAGNOSIS — Z833 Family history of diabetes mellitus: Secondary | ICD-10-CM

## 2022-08-09 DIAGNOSIS — M47817 Spondylosis without myelopathy or radiculopathy, lumbosacral region: Secondary | ICD-10-CM | POA: Diagnosis not present

## 2022-08-09 DIAGNOSIS — G629 Polyneuropathy, unspecified: Secondary | ICD-10-CM | POA: Diagnosis present

## 2022-08-09 DIAGNOSIS — R29898 Other symptoms and signs involving the musculoskeletal system: Secondary | ICD-10-CM | POA: Diagnosis not present

## 2022-08-09 DIAGNOSIS — Z885 Allergy status to narcotic agent status: Secondary | ICD-10-CM

## 2022-08-09 DIAGNOSIS — Z881 Allergy status to other antibiotic agents status: Secondary | ICD-10-CM | POA: Diagnosis not present

## 2022-08-09 DIAGNOSIS — E872 Acidosis, unspecified: Secondary | ICD-10-CM | POA: Diagnosis present

## 2022-08-09 DIAGNOSIS — Z887 Allergy status to serum and vaccine status: Secondary | ICD-10-CM | POA: Diagnosis not present

## 2022-08-09 DIAGNOSIS — Z91048 Other nonmedicinal substance allergy status: Secondary | ICD-10-CM

## 2022-08-09 DIAGNOSIS — Z888 Allergy status to other drugs, medicaments and biological substances status: Secondary | ICD-10-CM

## 2022-08-09 DIAGNOSIS — F32A Depression, unspecified: Secondary | ICD-10-CM | POA: Diagnosis not present

## 2022-08-09 DIAGNOSIS — Z91040 Latex allergy status: Secondary | ICD-10-CM

## 2022-08-09 DIAGNOSIS — F411 Generalized anxiety disorder: Secondary | ICD-10-CM | POA: Diagnosis not present

## 2022-08-09 DIAGNOSIS — R531 Weakness: Secondary | ICD-10-CM | POA: Diagnosis not present

## 2022-08-09 DIAGNOSIS — R778 Other specified abnormalities of plasma proteins: Secondary | ICD-10-CM | POA: Diagnosis present

## 2022-08-09 DIAGNOSIS — I10 Essential (primary) hypertension: Secondary | ICD-10-CM | POA: Diagnosis present

## 2022-08-09 DIAGNOSIS — E8721 Acute metabolic acidosis: Secondary | ICD-10-CM | POA: Diagnosis not present

## 2022-08-09 DIAGNOSIS — K219 Gastro-esophageal reflux disease without esophagitis: Secondary | ICD-10-CM | POA: Diagnosis present

## 2022-08-09 DIAGNOSIS — G894 Chronic pain syndrome: Secondary | ICD-10-CM | POA: Diagnosis present

## 2022-08-09 DIAGNOSIS — N390 Urinary tract infection, site not specified: Secondary | ICD-10-CM | POA: Diagnosis not present

## 2022-08-09 DIAGNOSIS — Z9011 Acquired absence of right breast and nipple: Secondary | ICD-10-CM

## 2022-08-09 DIAGNOSIS — Z7982 Long term (current) use of aspirin: Secondary | ICD-10-CM

## 2022-08-09 DIAGNOSIS — B961 Klebsiella pneumoniae [K. pneumoniae] as the cause of diseases classified elsewhere: Secondary | ICD-10-CM | POA: Diagnosis not present

## 2022-08-09 DIAGNOSIS — Z853 Personal history of malignant neoplasm of breast: Secondary | ICD-10-CM

## 2022-08-09 DIAGNOSIS — N281 Cyst of kidney, acquired: Secondary | ICD-10-CM | POA: Diagnosis present

## 2022-08-09 DIAGNOSIS — Z79899 Other long term (current) drug therapy: Secondary | ICD-10-CM

## 2022-08-09 DIAGNOSIS — R748 Abnormal levels of other serum enzymes: Secondary | ICD-10-CM

## 2022-08-09 DIAGNOSIS — M329 Systemic lupus erythematosus, unspecified: Secondary | ICD-10-CM | POA: Diagnosis not present

## 2022-08-09 DIAGNOSIS — Z803 Family history of malignant neoplasm of breast: Secondary | ICD-10-CM

## 2022-08-09 DIAGNOSIS — S3992XA Unspecified injury of lower back, initial encounter: Secondary | ICD-10-CM | POA: Diagnosis not present

## 2022-08-09 DIAGNOSIS — Z8673 Personal history of transient ischemic attack (TIA), and cerebral infarction without residual deficits: Secondary | ICD-10-CM

## 2022-08-09 LAB — URINALYSIS, ROUTINE W REFLEX MICROSCOPIC
Bilirubin Urine: NEGATIVE
Glucose, UA: NEGATIVE mg/dL
Ketones, ur: NEGATIVE mg/dL
Nitrite: NEGATIVE
Protein, ur: NEGATIVE mg/dL
Specific Gravity, Urine: 1.01 (ref 1.005–1.030)
WBC, UA: >50 WBC/hpf — ABNORMAL HIGH (ref 0–5)
pH: 5 (ref 5.0–8.0)

## 2022-08-09 LAB — BASIC METABOLIC PANEL
Anion gap: 19 — ABNORMAL HIGH (ref 5–15)
BUN: 66 mg/dL — ABNORMAL HIGH (ref 8–23)
CO2: 21 mmol/L — ABNORMAL LOW (ref 22–32)
Calcium: 9.4 mg/dL (ref 8.9–10.3)
Chloride: 97 mmol/L — ABNORMAL LOW (ref 98–111)
Creatinine, Ser: 7.02 mg/dL — ABNORMAL HIGH (ref 0.44–1.00)
GFR, Estimated: 6 mL/min — ABNORMAL LOW (ref >60)
Glucose, Bld: 109 mg/dL — ABNORMAL HIGH (ref 70–99)
Potassium: 4.2 mmol/L (ref 3.5–5.1)
Sodium: 137 mmol/L (ref 135–145)

## 2022-08-09 LAB — CBC
HCT: 39.8 % (ref 36.0–46.0)
Hemoglobin: 12.8 g/dL (ref 12.0–15.0)
MCH: 31.2 pg (ref 26.0–34.0)
MCHC: 32.2 g/dL (ref 30.0–36.0)
MCV: 97.1 fL (ref 80.0–100.0)
Platelets: 203 10*3/uL (ref 150–400)
RBC: 4.1 MIL/uL (ref 3.87–5.11)
RDW: 13.5 % (ref 11.5–15.5)
WBC: 8.9 10*3/uL (ref 4.0–10.5)
nRBC: 0 % (ref 0.0–0.2)

## 2022-08-09 LAB — TROPONIN I (HIGH SENSITIVITY): Troponin I (High Sensitivity): 21 ng/L — ABNORMAL HIGH (ref <18)

## 2022-08-09 LAB — CK: Total CK: 376 U/L — ABNORMAL HIGH (ref 38–234)

## 2022-08-09 MED ORDER — SODIUM CHLORIDE 0.9 % IV SOLN: Freq: Once | INTRAVENOUS | Status: AC

## 2022-08-09 MED ORDER — SODIUM CHLORIDE 0.9 % IV SOLN
1.0000 g | Freq: Once | INTRAVENOUS | Status: AC
  Administered 2022-08-09: 1 g via INTRAVENOUS
  Filled 2022-08-09: qty 10

## 2022-08-09 NOTE — ED Provider Notes (Signed)
[facility] Provider Note    Event Date/Time   First MD Initiated Contact with Patient 08/09/22 2020     (approximate)   History   Fall   HPI  Donna Evans is a 76 y.o. female who reports she was feeling thirsty says she was getting up going to the kitchen to get something to drink and fell.  She is not quite sure why she fell she does not think she tripped but she does not know for sure.  She reported when she tried to get up she kept falling again over and over again.      Physical Exam   Triage Vital Signs: ED Triage Vitals  Enc Vitals Group     BP 08/09/22 1735 (!) 90/40     Pulse Rate 08/09/22 1735 78     Resp 08/09/22 1735 18     Temp 08/09/22 1735 98.6 F (37 C)     Temp Source 08/09/22 1735 Oral     SpO2 08/09/22 1735 100 %     Weight 08/09/22 1732 121 lb (54.9 kg)     Height --      Head Circumference --      Peak Flow --      Pain Score 08/09/22 1732 10     Pain Loc --      Pain Edu? --      Excl. in [place]? --     Most recent vital signs: Vitals:   08/09/22 1737 08/09/22 2050  BP: 98/72 (!) 112/53  Pulse:  79  Resp:  18  Temp:  98.2 F (36.8 C)  SpO2:  99%     General: Awake, no distress.  CV:  Good peripheral perfusion.  Heart regular rate and rhythm no audible murmurs Resp:  Normal effort.  Lungs are clear Chest is nontender there is some tenderness over the right lower rib at the posterior axillary line.   Abdomen soft bowel sounds are positive there is no CVA tenderness there is no tenderness in the abdomen to deep palpation. There is some bruising on the arms that patient reports is new from today.   ED Results / Procedures / Treatments   Labs (all labs ordered are listed, but only abnormal results are displayed) Labs Reviewed  BASIC METABOLIC PANEL - Abnormal; Notable for the following components:      Result Value   Chloride 97 (*)    CO2 21 (*)    Glucose, Bld 109 (*)    BUN 66 (*)    Creatinine, Ser  7.02 (*)    GFR, Estimated 6 (*)    Anion gap 19 (*)    All other components within normal limits  URINALYSIS, ROUTINE W REFLEX MICROSCOPIC - Abnormal; Notable for the following components:   Color, Urine YELLOW (*)    APPearance HAZY (*)    Hgb urine dipstick MODERATE (*)    Leukocytes,Ua LARGE (*)    WBC, UA >50 (*)    Bacteria, UA RARE (*)    All other components within normal limits  TROPONIN I (HIGH SENSITIVITY) - Abnormal; Notable for the following components:   Troponin I (High Sensitivity) 21 (*)    All other components within normal limits  CBC  CBG MONITORING, ED  TROPONIN I (HIGH SENSITIVITY)     EKG  EKG read and interpreted by me shows normal sinus rhythm rate of 75 nonspecific ST-T wave changes are present   RADIOLOGY Lumbar spine  done shows DJD no acute fractures per radiology reviewed and interpreted the films and agree CT of the head and neck read by radiology reviewed and interpreted by me showing no acute disease   PROCEDURES:  Critical Care performed: Critical care time 30 minutes.  This includes reviewing the patient's findings talking to the patient and patient's family member having to explain to the patient several times why she needs to spend the night in the hospital at least.  Patient really really wanted to go home.  Procedures   MEDICATIONS ORDERED IN ED: Medications  cefTRIAXone (ROCEPHIN) 1 g in sodium chloride 0.9 % 100 mL IVPB (0 g Intravenous Stopped 08/09/22 2231)  0.9 %  sodium chloride infusion ( Intravenous New Bag/Given 08/09/22 2049)     IMPRESSION / MDM / ASSESSMENT AND PLAN / ED COURSE  I reviewed the triage vital signs and the nursing notes.  Gave the patient some Rocephin for her UTI and began giving her fluids.  Blood pressure came up slowly.  Patient remains awake alert oriented asking for something to drink which we gave her.  She looks well and her labs do not. Differential diagnosis includes, but is not limited to,  dehydration, renal failure from others problem, UTI, doubt sepsis as patient is not febrile and really does not have a high white count.  She remains awake alert and oriented.  Patient's presentation is most consistent with acute presentation with potential threat to life or bodily function.  The patient is on the cardiac monitor to evaluate for evidence of arrhythmia and/or significant heart rate changes.  None were seen      FINAL CLINICAL IMPRESSION(S) / ED DIAGNOSES   Final diagnoses:  Fall, initial encounter  Urinary tract infection without hematuria, site unspecified  AKI (acute kidney injury) ([place])  Hypotension, unspecified hypotension type     Rx / DC Orders   ED Discharge Orders     None        Note:  This document was prepared using Dragon voice recognition software and may include unintentional dictation errors.   Nena Polio, MD 08/09/22 2325

## 2022-08-09 NOTE — ED Triage Notes (Signed)
First Nurse Note:  Arrives from home via [facility].  C/O multiple falls over past four days.  Has history of UTI, and patient c/o foul smelling urine.  VS wnl.

## 2022-08-09 NOTE — H&P (Signed)
History and Physical    [name] XNA:[id number] DOB: 1946/10/18 DOA: 08/09/2022  PCP: Jodi Marble, MD  Patient coming from: home  I have personally briefly reviewed patient's old medical records in [place]  Chief Complaint: multiple fall / symptoms of uti  HPI: Donna Evans is a 76 y.o. female with medical history significant of   hypertension, stroke, GERD,depression with anxiety, migraine headache, right breast cancer (s/p mastectomy), chronic pain syndrome, dCHF, CKD stage IIIa, history of recurrent falls in setting of djd of the spine and peripheral neuropathy,COPD who presents with complaint of increasing falls as well as symptoms of uti, stating her urine has foul odor. Patient states she has fall that was different from her other falls. She states she did not had dizziness, or chest pain , or n/v/d prior to fall. She was in her normal state of health. She states she was putting her dog out and all of a sudden felt her body go weak and she was not able to hold her weight. She states she did not feel presyncope or have any prodrome prior to fall. Currently she notes she still feels weak.  She also has complaint of foul odor to her urine. She notes no dysuria, no flank pain no blood in her urine no fevers or chills.  ED Course:  IN ED vitals: afeb, bp 90/60, hr 78, sat 100%   Labs Ua: +LE ,>50 wbc Wbc:8.9, hgb 12.8,  plt 203 NA 137, K 4.2 , cL97, gly 109, cr 7 ( 1.15) CE 21 ( down from 191) Lumbar spine  IMPRESSION: No recent fracture is seen in the lumbar spine. Degenerative changes are noted, more so at L5-S1 level.  EKG:NSR RBBB, CTH: NAD IMPRESSION: Atrophy with small vessel chronic ischemic changes of deep cerebral white matter.   No acute intracranial abnormalities.   Multilevel degenerative disc and facet disease changes of the cervical spine.   No acute cervical spine abnormalities.  Tx ctx Review of Systems: As per HPI otherwise 10 point  review of systems negative.   Past Medical History:  Diagnosis Date   Allergic state    Anginal pain ([place])    Prinzmetal's angina   Anxiety    Arthritis    osteoarthritis   Breast cancer ([place]) 1990   right breast cancer   Chronic pain    Chronic pain    COPD (chronic obstructive pulmonary disease) (HCC)    Depression    Edema    Fibromyalgia    Foot fracture    Bilateral   GERD (gastroesophageal reflux disease)    History of kidney stones    Hypertension    Leg fracture, right    Low back pain    Lumbosacral neuritis    Medulloadrenal hyperfunction (HCC)    Migraine headache    Peripheral neuropathy    Shoulder fracture, right    Stroke [facility])    TIA   Systemic lupus erythematosus ([place])    Thyroid disease    TIA (transient ischemic attack) 11/28/2013   Transient global amnesia 2011    Past Surgical History:  Procedure Laterality Date   ABDOMINAL HYSTERECTOMY  1981   APPENDECTOMY  1957   AUGMENTATION MAMMAPLASTY Bilateral 1990   saline submuscular   BACK SURGERY  1989   BILATERAL TOTAL MASTECTOMY WITH AXILLARY LYMPH NODE DISSECTION  1990   BREAST IMPLANT EXCHANGE Bilateral 05/11/2016   Procedure: REMOVAL AND REPLACEMENT OF BREAST IMPLANTS ;  Surgeon: Irene Limbo,  MD;  Location: [place];  Service: Plastics;  Laterality: Bilateral;   CAPSULECTOMY Bilateral 05/11/2016   Procedure: CAPSULECTOMY CAPSULORRAPHY ;  Surgeon: Irene Limbo, MD;  Location: [place];  Service: Plastics;  Laterality: Bilateral;   CATARACT EXTRACTION W/PHACO Right 12/26/2018   Procedure: CATARACT EXTRACTION PHACO AND INTRAOCULAR LENS PLACEMENT ([place]) RIGHT;  Surgeon: Birder Robson, MD;  Location: [facility];  Service: Ophthalmology;  Laterality: Right;  [place]  00:46 CDE 8.50 Fluid pack lot # [id number]   CATARACT EXTRACTION W/PHACO Left 01/23/2019   Procedure: CATARACT EXTRACTION PHACO AND INTRAOCULAR LENS PLACEMENT ([place]) LEFT;  Surgeon: Birder Robson, MD;   Location: [facility];  Service: Ophthalmology;  Laterality: Left;  [place]  00:45 CDE  8.41 fluid pack lot # [id number]   CHOLECYSTECTOMY  1979   COLONOSCOPY WITH PROPOFOL N/A 01/25/2018   Procedure: COLONOSCOPY WITH PROPOFOL;  Surgeon: Manya Silvas, MD;  [facility];  Service: Endoscopy;  Laterality: N/A;   COPD     ESOPHAGOGASTRODUODENOSCOPY (EGD) WITH PROPOFOL N/A 03/07/2018   Procedure: ESOPHAGOGASTRODUODENOSCOPY (EGD) WITH PROPOFOL;  Surgeon: Manya Silvas, MD;  Location: [facility] ENDOSCOPY;  Service: Endoscopy;  Laterality: N/A;   FRACTURE SURGERY     HARDWARE REMOVAL Left 06/18/2020   Procedure: HARDWARE REMOVAL;  Surgeon: Lovell Sheehan, MD;  Location: [facility];  Service: Orthopedics;  Laterality: Left;   HUMERUS IM NAIL Left 12/31/2019   Procedure: INTRAMEDULLARY (IM) NAIL HUMERAL;  Surgeon: Lovell Sheehan, MD;  Location: [facility];  Service: Orthopedics;  Laterality: Left;   LIPOSUCTION Right 05/11/2016   Procedure: LIPOSUCTION;  Surgeon: Irene Limbo, MD;  Location: [place];  Service: Plastics;  Laterality: Right;   LIVER BIOPSY  2011   MASTECTOMY SUBCUTANEOUS Bilateral 1990   MASTOPEXY Bilateral 05/11/2016   Procedure: MASTOPEXY BILATERAL ;  Surgeon: Irene Limbo, MD;  Location: [place];  Service: Plastics;  Laterality: Bilateral;     reports that she quit smoking about 16 years ago. Her smoking use included cigarettes. She has a 40.00 pack-year smoking history. She has never used smokeless tobacco. She reports that she does not currently use alcohol after a past usage of about 2.0 standard drinks of alcohol per week. She reports that she does not use drugs.  Allergies  Allergen Reactions   Codeine Anaphylaxis and Hives    Reaction:  Unknown    Demerol [Meperidine] Anaphylaxis and Hives    Reaction:  Unknown     Latex Anaphylaxis and Rash   Morphine And Related Hives   Ciprofloxacin Nausea And Vomiting   Doxycycline Nausea  And Vomiting   Flagyl [Metronidazole] Nausea And Vomiting    Reaction:  Unknown    Fluconazole Hives   Influenza Vaccines Swelling    Localized swelling   Tape Other (See Comments)    Pt allergic to Adhesive tape and latex.(  Skin breaks out)   Valtrex [Valacyclovir Hcl] Hives   Duloxetine Hcl Other (See Comments)    Reaction:  Unknown    Amoxicillin-Pot Clavulanate Nausea And Vomiting   Sulfamethoxazole-Trimethoprim Nausea And Vomiting   Valacyclovir Rash    Family History  Problem Relation Age of Onset   Atrial fibrillation Mother    Atrial fibrillation Sister    Cancer Sister    Diabetes Sister    Breast cancer Sister 69   Diabetes Father    Cancer Sister    Diabetes Sister    Atrial fibrillation Sister    Kidney disease  Maternal Aunt     Prior to Admission medications   Medication Sig Start Date End Date Taking? Authorizing Provider  ALPRAZolam Duanne Moron) 1 MG tablet Take 2 mg by mouth 2 (two) times daily. 08/04/22  Yes [provider]  aspirin EC 81 MG tablet Take 1 tablet (81 mg total) by mouth daily. Swallow whole. 05/05/22  Yes Wyvonnia Dusky, MD  Cyanocobalamin (VITAMIN B-12 IJ) Inject 1,000 mg as directed every 30 (thirty) days.   Yes [provider]  cyclobenzaprine (FLEXERIL) 10 MG tablet Take 10 mg by mouth 3 (three) times daily. 07/23/22  Yes [provider]  olmesartan (BENICAR) 40 MG tablet Take 1 tablet (40 mg total) by mouth every morning. Hold this med for MAP <65 and/or SBP < 110 05/04/22  Yes Wyvonnia Dusky, MD  rosuvastatin (CRESTOR) 10 MG tablet Take 10 mg by mouth daily. 02/24/22  Yes [provider]  Vitamin D, Ergocalciferol, 50000 units CAPS Take 1 capsule by mouth once a week. 07/23/22  Yes [provider]  zolpidem (AMBIEN) 10 MG tablet Take 1 tablet (10 mg total) by mouth at bedtime for 1 day. 05/04/22 08/09/22 Yes Wyvonnia Dusky, MD  alum & mag hydroxide-simeth (MAALOX/MYLANTA) 200-200-20 MG/5ML  suspension Take 15 mLs by mouth every 6 (six) hours as needed for indigestion or heartburn.    [provider]  butorphanol (STADOL) 10 MG/ML nasal spray Place 1 spray into the nose every 4 (four) hours as needed for headache. 10/22/16   Kathrine Haddock, NP  calcium carbonate (TUMS - DOSED IN MG ELEMENTAL CALCIUM) 500 MG chewable tablet Chew 1 tablet by mouth 3 (three) times daily as needed for indigestion or heartburn.    [provider]  hydrochlorothiazide (HYDRODIURIL) 12.5 MG tablet Take 1 tablet (12.5 mg total) by mouth 2 (two) times daily. Hold this med for MAP <65 and/or SBP < 110 05/04/22   Wyvonnia Dusky, MD  torsemide (DEMADEX) 20 MG tablet Take 1 tablet (20 mg total) by mouth daily as needed (fluid). Hold this med for MAP <65 and/or SBP < 110 05/04/22   Wyvonnia Dusky, MD    Physical Exam: Vitals:   08/09/22 1732 08/09/22 1735 08/09/22 1737 08/09/22 2050  BP:  (!) 90/40 98/72 (!) 112/53  Pulse:  78  79  Resp:  18  18  Temp:  98.6 F (37 C)  98.2 F (36.8 C)  TempSrc:  Oral  Oral  SpO2:  100%  99%  Weight: 54.9 kg       Vitals:   08/09/22 1732 08/09/22 1735 08/09/22 1737 08/09/22 2050  BP:  (!) 90/40 98/72 (!) 112/53  Pulse:  78  79  Resp:  18  18  Temp:  98.6 F (37 C)  98.2 F (36.8 C)  TempSrc:  Oral  Oral  SpO2:  100%  99%  Weight: 54.9 kg      Constitutional: NAD, calm, comfortable Eyes: PERRL, lids and conjunctivae normal ENMT: Mucous membranes are moist. Posterior pharynx clear of any exudate or lesions.Normal dentition.  Neck: normal, supple, no masses, no thyromegaly Respiratory: clear to auscultation bilaterally, no wheezing, no crackles. Normal respiratory effort. No accessory muscle use.  Cardiovascular: Regular rate and rhythm, no murmurs / rubs / gallops. No extremity edema. + pedal pulses Abdomen: no tenderness, no masses palpated. No hepatosplenomegaly. Bowel sounds positive.  Musculoskeletal: no clubbing / cyanosis. No joint  deformity upper and lower extremities. Good ROM, no contractures. Normal muscle tone.  Skin: no rashes, lesions, ulcers. No induration Neurologic: CN 2-12 grossly intact. Sensation intact,  Strength 5/5 in all 4.  Psychiatric: Normal judgment and insight. Alert and oriented x 3. Normal mood.    Labs on Admission: I have personally reviewed following labs and imaging studies  CBC: Recent Labs  Lab 08/09/22 1742  WBC 8.9  HGB 12.8  HCT 39.8  MCV 97.1  PLT 409   Basic Metabolic Panel: Recent Labs  Lab 08/09/22 1742  NA 137  K 4.2  CL 97*  CO2 21*  GLUCOSE 109*  BUN 66*  CREATININE 7.02*  CALCIUM 9.4   GFR: Estimated Creatinine Clearance: 5.9 mL/min (A) (by C-G formula based on SCr of 7.02 mg/dL (H)). Liver Function Tests: No results for input(s): "AST", "ALT", "ALKPHOS", "BILITOT", "PROT", "ALBUMIN" in the last 168 hours. No results for input(s): "LIPASE", "AMYLASE" in the last 168 hours. No results for input(s): "AMMONIA" in the last 168 hours. Coagulation Profile: No results for input(s): "INR", "PROTIME" in the last 168 hours. Cardiac Enzymes: No results for input(s): "CKTOTAL", "CKMB", "CKMBINDEX", "TROPONINI" in the last 168 hours. BNP (last 3 results) No results for input(s): "PROBNP" in the last 8760 hours. HbA1C: No results for input(s): "HGBA1C" in the last 72 hours. CBG: No results for input(s): "GLUCAP" in the last 168 hours. Lipid Profile: No results for input(s): "CHOL", "HDL", "LDLCALC", "TRIG", "CHOLHDL", "LDLDIRECT" in the last 72 hours. Thyroid Function Tests: No results for input(s): "TSH", "T4TOTAL", "FREET4", "T3FREE", "THYROIDAB" in the last 72 hours. Anemia Panel: No results for input(s): "VITAMINB12", "FOLATE", "FERRITIN", "TIBC", "IRON", "RETICCTPCT" in the last 72 hours. Urine analysis:    Component Value Date/Time   COLORURINE YELLOW (A) 08/09/2022 1739   APPEARANCEUR HAZY (A) 08/09/2022 1739   LABSPEC 1.010 08/09/2022 1739   PHURINE  5.0 08/09/2022 1739   GLUCOSEU NEGATIVE 08/09/2022 1739   HGBUR MODERATE (A) 08/09/2022 1739   BILIRUBINUR NEGATIVE 08/09/2022 1739   BILIRUBINUR neg 11/10/2015 1352   KETONESUR NEGATIVE 08/09/2022 1739   PROTEINUR NEGATIVE 08/09/2022 1739   UROBILINOGEN negative 11/10/2015 1352   UROBILINOGEN 0.2 11/20/2013 0201   NITRITE NEGATIVE 08/09/2022 1739   LEUKOCYTESUR LARGE (A) 08/09/2022 1739    Radiological Exams on Admission: DG Chest 2 View  Result Date: 08/09/2022 CLINICAL DATA:  Right rib pain after fall EXAM: CHEST - 2 VIEW COMPARISON:  04/28/2022 FINDINGS: No acute airspace disease. No pleural effusion or pneumothorax. Stable cardiomediastinal silhouette. Multiple old appearing bilateral rib fractures. Old left proximal humerus fracture. IMPRESSION: No active cardiopulmonary disease. Electronically Signed   By: Donavan Foil M.D.   On: 08/09/2022 21:24   CT HEAD WO CONTRAST (5MM)  Result Date: 08/09/2022 CLINICAL DATA:  Multiple falls, head and neck trauma, unable to give clear history, leg weakness. Past history breast cancer, TIA, COPD, GERD, stroke, lupus, hypertension EXAM: CT HEAD WITHOUT CONTRAST CT CERVICAL SPINE WITHOUT CONTRAST TECHNIQUE: Multidetector CT imaging of the head and cervical spine was performed following the standard protocol without intravenous contrast. Multiplanar CT image reconstructions of the cervical spine were also generated. RADIATION DOSE REDUCTION: This exam was performed according to the departmental dose-optimization program which includes automated exposure control, adjustment of the mA and/or kV according to patient size and/or use of iterative reconstruction technique. COMPARISON:  CT head 04/28/2022, CT cervical spine 12/15/2021 FINDINGS: CT HEAD FINDINGS Brain: Generalized atrophy. Normal ventricular morphology. No midline shift or mass effect. Small vessel chronic ischemic changes of deep cerebral white matter. No intracranial hemorrhage, mass  lesion,  evidence of acute infarction, or extra-axial fluid collection. Vascular: Minimal atherosclerotic calcification of internal carotid arteries at skull base. No hyperdense vessels. Skull: Demineralized but intact Sinuses/Orbits: Small amount of mucus/fluid within RIGHT sphenoid sinus. Remaining paranasal sinuses, mastoid air cells, and middle ear cavities clear Other: N/A CT CERVICAL SPINE FINDINGS Alignment: Minimal retrolisthesis at C4-C5 and C5-C6 similar to prior exam. Remaining alignments normal. Skull base and vertebrae: Osseous demineralization. Multilevel disc space narrowing and minimal endplate spur formation. Multilevel facet degenerative changes. Skull base intact. No fracture, additional subluxation, or bone destruction. Soft tissues and spinal canal: Prevertebral soft tissues normal thickness. Mild atherosclerotic calcifications at the RIGHT carotid bifurcation. Remaining soft tissues unremarkable. Disc levels:  No specific abnormalities Upper chest: Emphysematous changes and scarring at lung apices. Other: N/A IMPRESSION: Atrophy with small vessel chronic ischemic changes of deep cerebral white matter. No acute intracranial abnormalities. Multilevel degenerative disc and facet disease changes of the cervical spine. No acute cervical spine abnormalities. Electronically Signed   By: Lavonia Dana M.D.   On: 08/09/2022 18:54   CT Cervical Spine Wo Contrast  Result Date: 08/09/2022 CLINICAL DATA:  Multiple falls, head and neck trauma, unable to give clear history, leg weakness. Past history breast cancer, TIA, COPD, GERD, stroke, lupus, hypertension EXAM: CT HEAD WITHOUT CONTRAST CT CERVICAL SPINE WITHOUT CONTRAST TECHNIQUE: Multidetector CT imaging of the head and cervical spine was performed following the standard protocol without intravenous contrast. Multiplanar CT image reconstructions of the cervical spine were also generated. RADIATION DOSE REDUCTION: This exam was performed according to the  departmental dose-optimization program which includes automated exposure control, adjustment of the mA and/or kV according to patient size and/or use of iterative reconstruction technique. COMPARISON:  CT head 04/28/2022, CT cervical spine 12/15/2021 FINDINGS: CT HEAD FINDINGS Brain: Generalized atrophy. Normal ventricular morphology. No midline shift or mass effect. Small vessel chronic ischemic changes of deep cerebral white matter. No intracranial hemorrhage, mass lesion, evidence of acute infarction, or extra-axial fluid collection. Vascular: Minimal atherosclerotic calcification of internal carotid arteries at skull base. No hyperdense vessels. Skull: Demineralized but intact Sinuses/Orbits: Small amount of mucus/fluid within RIGHT sphenoid sinus. Remaining paranasal sinuses, mastoid air cells, and middle ear cavities clear Other: N/A CT CERVICAL SPINE FINDINGS Alignment: Minimal retrolisthesis at C4-C5 and C5-C6 similar to prior exam. Remaining alignments normal. Skull base and vertebrae: Osseous demineralization. Multilevel disc space narrowing and minimal endplate spur formation. Multilevel facet degenerative changes. Skull base intact. No fracture, additional subluxation, or bone destruction. Soft tissues and spinal canal: Prevertebral soft tissues normal thickness. Mild atherosclerotic calcifications at the RIGHT carotid bifurcation. Remaining soft tissues unremarkable. Disc levels:  No specific abnormalities Upper chest: Emphysematous changes and scarring at lung apices. Other: N/A IMPRESSION: Atrophy with small vessel chronic ischemic changes of deep cerebral white matter. No acute intracranial abnormalities. Multilevel degenerative disc and facet disease changes of the cervical spine. No acute cervical spine abnormalities. Electronically Signed   By: Lavonia Dana M.D.   On: 08/09/2022 18:54   DG Lumbar Spine Complete  Result Date: 08/09/2022 CLINICAL DATA:  Trauma, fall EXAM: LUMBAR SPINE - COMPLETE  4+ VIEW COMPARISON:  None Available. FINDINGS: No recent fracture is seen. Alignment of posterior margins of vertebral bodies is unremarkable. Degenerative changes are noted in facet joints, more so at L5-S1 level. There are scattered arterial calcifications. Surgical clips are noted in right upper quadrant. IMPRESSION: No recent fracture is seen in the lumbar spine. Degenerative changes are noted, more so at  L5-S1 level. Electronically Signed   By: Elmer Picker M.D.   On: 08/09/2022 18:45    EKG: Independently reviewed. See above  Assessment/Plan  AKI on CKDIIIa -ivfs  -hold nephrotoxic medications ( arb,torsemide, hctz) -renal u/s  UTI -continue ctx  -f/u culture data   Recurrent falls  Chronic progressive debility in setting of peripheral neuropathy -PT/OT  -patient unable to give history re fall -trauma imaging neg  -CTH neg -neuro exam non-focal  -described as legs giving out  -fall possible element of orthostasis   Abn CE -mild elevation  -improved from prior  -no cardiac symptoms   Hypertension -hold medications due to relative hypotension and aki   dCHF -hold  arb, torsemide  -strict I/o daily wt   CVA  -continue on secondary ppx   COPD -not on controller medications  GERD -ppi    Depression  Anxiety -no active issues    Migraine headache -supportive care     DVT prophylaxis:  heparin Code Status: full Family Communication: none at bedsdie Disposition Plan:patient  expected to be admitted greater than 2 midnights Consults called: n/a Admission status: med tele   Clance Boll MD [facility]  If 7PM-7AM, please contact night-coverage [email or web address] Password [facility]  08/09/2022, 11:24 PM

## 2022-08-09 NOTE — ED Provider Triage Note (Signed)
Emergency Medicine Provider Triage Evaluation Note  Donna Evans, a 76 y.o. female  was evaluated in triage.  Pt complains of multiple falls.  Patient arrives via EMS from home where she lives independently.  Patient is unable to give clear history related to the fall.  She does endorse feeling like her feet and legs are weak causing her to give out.  Is also endorsing inability to get out of bed today secondary to mechanical fall resulting in back pain.  Review of Systems  Positive: Falls, head injury Negative: FCS  Physical Exam  BP 98/72   Pulse 78   Temp 98.6 F (37 C) (Oral)   Resp 18   Wt 54.9 kg   SpO2 100%   BMI 18.95 kg/m  Gen:   Awake, no distress  NAD Resp:  Normal effort CTA MSK:   Moves extremities without difficulty  CVS:  RRR  Medical Decision Making  Medically screening exam initiated at 5:46 PM.  Appropriate orders placed.  [name] was informed that the remainder of the evaluation will be completed by another provider, this initial triage assessment does not replace that evaluation, and the importance of remaining in the ED until their evaluation is complete.  Geriatric patient to the ED via EMS from home where she lives independently, with reports of multiple mechanical falls.  Patient presents with a history of UTI noted to have foul-smelling urine.   Melvenia Needles, PA-C 08/09/22 1829

## 2022-08-09 NOTE — ED Triage Notes (Addendum)
PT coming from home [facility] after a fall. Pt stating they do not know what happened to cause the fall. Pt unable to explain if fall was mechanical or due to other symptoms. PT stating they feel like their feet "just go out from under me" Pt has purple feet syndrome... PT stating they believe they might have UTI, foul smelling urine.  Pt stating they have fallen many times over the course of the last few months but no one can figure out why.  After triaging patient, they are stating that they might have hit their head. Pt pushing on forehead in attempted to see if anything is tender.

## 2022-08-10 ENCOUNTER — Inpatient Hospital Stay: Payer: PPO

## 2022-08-10 DIAGNOSIS — N1831 Chronic kidney disease, stage 3a: Secondary | ICD-10-CM | POA: Diagnosis present

## 2022-08-10 DIAGNOSIS — K219 Gastro-esophageal reflux disease without esophagitis: Secondary | ICD-10-CM | POA: Diagnosis present

## 2022-08-10 DIAGNOSIS — N281 Cyst of kidney, acquired: Secondary | ICD-10-CM | POA: Diagnosis not present

## 2022-08-10 DIAGNOSIS — E785 Hyperlipidemia, unspecified: Secondary | ICD-10-CM

## 2022-08-10 DIAGNOSIS — R748 Abnormal levels of other serum enzymes: Secondary | ICD-10-CM

## 2022-08-10 DIAGNOSIS — M329 Systemic lupus erythematosus, unspecified: Secondary | ICD-10-CM | POA: Diagnosis present

## 2022-08-10 DIAGNOSIS — G629 Polyneuropathy, unspecified: Secondary | ICD-10-CM | POA: Diagnosis present

## 2022-08-10 DIAGNOSIS — Z881 Allergy status to other antibiotic agents status: Secondary | ICD-10-CM | POA: Diagnosis not present

## 2022-08-10 DIAGNOSIS — D631 Anemia in chronic kidney disease: Secondary | ICD-10-CM | POA: Diagnosis present

## 2022-08-10 DIAGNOSIS — I5032 Chronic diastolic (congestive) heart failure: Secondary | ICD-10-CM | POA: Diagnosis present

## 2022-08-10 DIAGNOSIS — I129 Hypertensive chronic kidney disease with stage 1 through stage 4 chronic kidney disease, or unspecified chronic kidney disease: Secondary | ICD-10-CM | POA: Diagnosis not present

## 2022-08-10 DIAGNOSIS — N179 Acute kidney failure, unspecified: Secondary | ICD-10-CM | POA: Diagnosis present

## 2022-08-10 DIAGNOSIS — G894 Chronic pain syndrome: Secondary | ICD-10-CM | POA: Diagnosis present

## 2022-08-10 DIAGNOSIS — J449 Chronic obstructive pulmonary disease, unspecified: Secondary | ICD-10-CM | POA: Diagnosis present

## 2022-08-10 DIAGNOSIS — E872 Acidosis, unspecified: Secondary | ICD-10-CM | POA: Diagnosis present

## 2022-08-10 DIAGNOSIS — E8721 Acute metabolic acidosis: Secondary | ICD-10-CM | POA: Diagnosis not present

## 2022-08-10 DIAGNOSIS — N390 Urinary tract infection, site not specified: Secondary | ICD-10-CM | POA: Diagnosis present

## 2022-08-10 DIAGNOSIS — I248 Other forms of acute ischemic heart disease: Secondary | ICD-10-CM | POA: Diagnosis present

## 2022-08-10 DIAGNOSIS — K589 Irritable bowel syndrome without diarrhea: Secondary | ICD-10-CM | POA: Diagnosis present

## 2022-08-10 DIAGNOSIS — R296 Repeated falls: Secondary | ICD-10-CM | POA: Diagnosis present

## 2022-08-10 DIAGNOSIS — G43909 Migraine, unspecified, not intractable, without status migrainosus: Secondary | ICD-10-CM | POA: Diagnosis present

## 2022-08-10 DIAGNOSIS — Z882 Allergy status to sulfonamides status: Secondary | ICD-10-CM | POA: Diagnosis not present

## 2022-08-10 DIAGNOSIS — B961 Klebsiella pneumoniae [K. pneumoniae] as the cause of diseases classified elsewhere: Secondary | ICD-10-CM | POA: Diagnosis present

## 2022-08-10 DIAGNOSIS — I130 Hypertensive heart and chronic kidney disease with heart failure and stage 1 through stage 4 chronic kidney disease, or unspecified chronic kidney disease: Secondary | ICD-10-CM | POA: Diagnosis present

## 2022-08-10 DIAGNOSIS — F411 Generalized anxiety disorder: Secondary | ICD-10-CM | POA: Diagnosis present

## 2022-08-10 DIAGNOSIS — F32A Depression, unspecified: Secondary | ICD-10-CM | POA: Diagnosis present

## 2022-08-10 DIAGNOSIS — Z887 Allergy status to serum and vaccine status: Secondary | ICD-10-CM | POA: Diagnosis not present

## 2022-08-10 DIAGNOSIS — M797 Fibromyalgia: Secondary | ICD-10-CM | POA: Diagnosis present

## 2022-08-10 LAB — COMPREHENSIVE METABOLIC PANEL
ALT: 16 U/L (ref 0–44)
AST: 31 U/L (ref 15–41)
Albumin: 3.7 g/dL (ref 3.5–5.0)
Alkaline Phosphatase: 276 U/L — ABNORMAL HIGH (ref 38–126)
Anion gap: 16 — ABNORMAL HIGH (ref 5–15)
BUN: 66 mg/dL — ABNORMAL HIGH (ref 8–23)
CO2: 19 mmol/L — ABNORMAL LOW (ref 22–32)
Calcium: 8.7 mg/dL — ABNORMAL LOW (ref 8.9–10.3)
Chloride: 102 mmol/L (ref 98–111)
Creatinine, Ser: 5.98 mg/dL — ABNORMAL HIGH (ref 0.44–1.00)
GFR, Estimated: 7 mL/min — ABNORMAL LOW (ref >60)
Glucose, Bld: 81 mg/dL (ref 70–99)
Potassium: 3.9 mmol/L (ref 3.5–5.1)
Sodium: 137 mmol/L (ref 135–145)
Total Bilirubin: 0.6 mg/dL (ref 0.3–1.2)
Total Protein: 6.9 g/dL (ref 6.5–8.1)

## 2022-08-10 LAB — CBC
HCT: 36.9 % (ref 36.0–46.0)
Hemoglobin: 11.9 g/dL — ABNORMAL LOW (ref 12.0–15.0)
MCH: 31.2 pg (ref 26.0–34.0)
MCHC: 32.2 g/dL (ref 30.0–36.0)
MCV: 96.6 fL (ref 80.0–100.0)
Platelets: 197 10*3/uL (ref 150–400)
RBC: 3.82 MIL/uL — ABNORMAL LOW (ref 3.87–5.11)
RDW: 13.5 % (ref 11.5–15.5)
WBC: 8 10*3/uL (ref 4.0–10.5)
nRBC: 0 % (ref 0.0–0.2)

## 2022-08-10 LAB — BLOOD GAS, VENOUS
Acid-base deficit: 3.6 mmol/L — ABNORMAL HIGH (ref 0.0–2.0)
Bicarbonate: 21.5 mmol/L (ref 20.0–28.0)
O2 Saturation: 54.5 %
Patient temperature: 37
pCO2, Ven: 38 mmHg — ABNORMAL LOW (ref 44–60)
pH, Ven: 7.36 (ref 7.25–7.43)
pO2, Ven: 34 mmHg (ref 32–45)

## 2022-08-10 LAB — TROPONIN I (HIGH SENSITIVITY): Troponin I (High Sensitivity): 19 ng/L — ABNORMAL HIGH (ref <18)

## 2022-08-10 MED ORDER — ACETAMINOPHEN 650 MG RE SUPP: 650.0000 mg | Freq: Four times a day (QID) | RECTAL | Status: DC | PRN

## 2022-08-10 MED ORDER — ROSUVASTATIN CALCIUM 10 MG PO TABS
10.0000 mg | ORAL_TABLET | Freq: Every day | ORAL | Status: DC
  Administered 2022-08-10 – 2022-08-13 (×4): 10 mg via ORAL
  Filled 2022-08-10 (×4): qty 1

## 2022-08-10 MED ORDER — ONDANSETRON HCL 4 MG/2ML IJ SOLN: 4.0000 mg | Freq: Four times a day (QID) | INTRAMUSCULAR | Status: DC | PRN

## 2022-08-10 MED ORDER — HEPARIN SODIUM (PORCINE) 5000 UNIT/ML IJ SOLN
5000.0000 [IU] | Freq: Three times a day (TID) | INTRAMUSCULAR | Status: DC
  Administered 2022-08-10 – 2022-08-13 (×9): 5000 [IU] via SUBCUTANEOUS
  Filled 2022-08-10 (×9): qty 1

## 2022-08-10 MED ORDER — ALBUTEROL SULFATE (2.5 MG/3ML) 0.083% IN NEBU: 2.5000 mg | INHALATION_SOLUTION | RESPIRATORY_TRACT | Status: DC | PRN

## 2022-08-10 MED ORDER — ALPRAZOLAM 0.5 MG PO TABS
2.0000 mg | ORAL_TABLET | Freq: Two times a day (BID) | ORAL | Status: DC
  Administered 2022-08-10 – 2022-08-13 (×8): 2 mg via ORAL
  Filled 2022-08-10 (×8): qty 4

## 2022-08-10 MED ORDER — ACETAMINOPHEN 325 MG PO TABS
650.0000 mg | ORAL_TABLET | Freq: Four times a day (QID) | ORAL | Status: DC | PRN
  Administered 2022-08-10 – 2022-08-13 (×7): 650 mg via ORAL
  Filled 2022-08-10 (×7): qty 2

## 2022-08-10 MED ORDER — ASPIRIN 81 MG PO TBEC
81.0000 mg | DELAYED_RELEASE_TABLET | Freq: Every day | ORAL | Status: DC
  Administered 2022-08-10 – 2022-08-13 (×4): 81 mg via ORAL
  Filled 2022-08-10 (×4): qty 1

## 2022-08-10 MED ORDER — SODIUM CHLORIDE 0.9 % IV SOLN
1.0000 g | INTRAVENOUS | Status: DC
  Administered 2022-08-10 – 2022-08-12 (×3): 1 g via INTRAVENOUS
  Filled 2022-08-10 (×4): qty 10

## 2022-08-10 MED ORDER — SODIUM CHLORIDE 0.9 % IV SOLN: INTRAVENOUS | Status: AC

## 2022-08-10 MED ORDER — BUTORPHANOL TARTRATE 10 MG/ML NA SOLN: 1.0000 | NASAL | Status: DC | PRN

## 2022-08-10 MED ORDER — ONDANSETRON HCL 4 MG PO TABS: 4.0000 mg | ORAL_TABLET | Freq: Four times a day (QID) | ORAL | Status: DC | PRN

## 2022-08-10 NOTE — Consult Note (Signed)
[facility]  CONSULT NOTE    Date: 08/10/2022                  Patient Name:  Donna Evans  MRN: [id number]  DOB: 1946-05-01  Age / Sex: 76 y.o., female         PCP: Jodi Marble, MD                 Service Requesting Consult: [facility]                 Reason for Consult: Acute kidney injury            History of Present Illness: Donna Evans is a 76 y.o.  female with past medical conditions including stroke, GERD, diastolic heart failure, hypertension, right breast cancer with mastectomy, depression, and chronic kidney disease stage IIIa, who was admitted to [facility] on 08/09/2022 for AKI (acute kidney injury) [facility]) [N17.9]  Patient is seen resting on stretcher, appears comfortable.  No family at bedside.  Patient states she usually has a poor appetite, does not like Ensure or other protein supplements.  Patient states that even though she may not eat or drink large meals, she continues to take all medications including diuretics.  Patient states that she has no close family, husband and son are both deceased.  Patient states that son was a dialysis patient in past when he was 76 years old.  Patient states it is hard to fix a meal for 1 person.  She also voices concerns of irritable bowel syndrome and having diarrhea when she does eat.  She states this is the reason she misses a lot of meals.  Denies known fever or chills.  Denies dizziness.  Does complain of sudden weakness resulting in multiple falls.  Labs on ED arrival include sodium 137, potassium 4.2, bicarb 21, BUN 66, and creatinine 7.02 with GFR 6.  Baseline creatinine appears to be 1.15 with GFR 49 on 05/04/2022.  Chest x-ray negative for acute changes.  CT head and cervical spine positive for age-related changes.  Renal ultrasound shows bilateral simple cysts, no hydronephrosis.  IV fluids initiated in emergency department.  Medications: Outpatient medications: (Not in a hospital admission)   Current  medications: Current Facility-Administered Medications  Medication Dose Route Frequency Provider Last Rate Last Admin   0.9 %  sodium chloride infusion   Intravenous Continuous Clance Boll, MD 75 mL/hr at 08/10/22 0239 New Bag at 08/10/22 0239   acetaminophen (TYLENOL) tablet 650 mg  650 mg Oral Q6H PRN Clance Boll, MD   650 mg at 08/10/22 3009   Or   acetaminophen (TYLENOL) suppository 650 mg  650 mg Rectal Q6H PRN Clance Boll, MD       albuterol (PROVENTIL) (2.5 MG/3ML) 0.083% nebulizer solution 2.5 mg  2.5 mg Nebulization Q2H PRN Clance Boll, MD       ALPRAZolam Duanne Moron) tablet 2 mg  2 mg Oral BID Myles Rosenthal A, MD   2 mg at 08/10/22 2330   aspirin EC tablet 81 mg  81 mg Oral Daily Myles Rosenthal A, MD   81 mg at 08/10/22 0828   butorphanol (STADOL) nasal spray 1 spray  1 spray Nasal Q4H PRN Clance Boll, MD       cefTRIAXone (ROCEPHIN) 1 g in sodium chloride 0.9 % 100 mL IVPB  1 g Intravenous Q24H Clance Boll, MD       heparin  injection 5,000 Units  5,000 Units Subcutaneous Q8H Myles Rosenthal A, MD   5,000 Units at 08/10/22 0305   ondansetron [facility]) tablet 4 mg  4 mg Oral Q6H PRN Clance Boll, MD       Or   ondansetron [facility]) injection 4 mg  4 mg Intravenous Q6H PRN Clance Boll, MD       rosuvastatin (CRESTOR) tablet 10 mg  10 mg Oral Daily Myles Rosenthal A, MD   10 mg at 08/10/22 8527   Current Outpatient Medications  Medication Sig Dispense Refill   ALPRAZolam (XANAX) 1 MG tablet Take 2 mg by mouth 2 (two) times daily.     aspirin EC 81 MG tablet Take 1 tablet (81 mg total) by mouth daily. Swallow whole. 30 tablet 0   Cyanocobalamin (VITAMIN B-12 IJ) Inject 1,000 mg as directed every 30 (thirty) days.     cyclobenzaprine (FLEXERIL) 10 MG tablet Take 10 mg by mouth 3 (three) times daily.     olmesartan (BENICAR) 40 MG tablet Take 1 tablet (40 mg total) by mouth every morning. Hold this med for MAP <65 and/or  SBP < 110     rosuvastatin (CRESTOR) 10 MG tablet Take 10 mg by mouth daily.     Vitamin D, Ergocalciferol, 50000 units CAPS Take 1 capsule by mouth once a week.     zolpidem (AMBIEN) 10 MG tablet Take 1 tablet (10 mg total) by mouth at bedtime for 1 day. 1 tablet 0   alum & mag hydroxide-simeth (MAALOX/MYLANTA) 200-200-20 MG/5ML suspension Take 15 mLs by mouth every 6 (six) hours as needed for indigestion or heartburn.     butorphanol (STADOL) 10 MG/ML nasal spray Place 1 spray into the nose every 4 (four) hours as needed for headache. 15 mL 0   calcium carbonate (TUMS - DOSED IN MG ELEMENTAL CALCIUM) 500 MG chewable tablet Chew 1 tablet by mouth 3 (three) times daily as needed for indigestion or heartburn.     hydrochlorothiazide (HYDRODIURIL) 12.5 MG tablet Take 1 tablet (12.5 mg total) by mouth 2 (two) times daily. Hold this med for MAP <65 and/or SBP < 110 60 tablet 5   torsemide (DEMADEX) 20 MG tablet Take 1 tablet (20 mg total) by mouth daily as needed (fluid). Hold this med for MAP <65 and/or SBP < 110        Allergies: Allergies  Allergen Reactions   Codeine Anaphylaxis and Hives    Reaction:  Unknown    Demerol [Meperidine] Anaphylaxis and Hives    Reaction:  Unknown     Latex Anaphylaxis and Rash   Morphine And Related Hives   Ciprofloxacin Nausea And Vomiting   Doxycycline Nausea And Vomiting   Flagyl [Metronidazole] Nausea And Vomiting    Reaction:  Unknown    Fluconazole Hives   Influenza Vaccines Swelling    Localized swelling   Tape Other (See Comments)    Pt allergic to Adhesive tape and latex.(  Skin breaks out)   Valtrex [Valacyclovir Hcl] Hives   Duloxetine Hcl Other (See Comments)    Reaction:  Unknown    Amoxicillin-Pot Clavulanate Nausea And Vomiting   Sulfamethoxazole-Trimethoprim Nausea And Vomiting   Valacyclovir Rash      Past Medical History: Past Medical History:  Diagnosis Date   Allergic state    Anginal pain ([place])    Prinzmetal's angina    Anxiety    Arthritis    osteoarthritis   Breast cancer ([place]) 1990  right breast cancer   Chronic pain    Chronic pain    COPD (chronic obstructive pulmonary disease) (HCC)    Depression    Edema    Fibromyalgia    Foot fracture    Bilateral   GERD (gastroesophageal reflux disease)    History of kidney stones    Hypertension    Leg fracture, right    Low back pain    Lumbosacral neuritis    Medulloadrenal hyperfunction (HCC)    Migraine headache    Peripheral neuropathy    Shoulder fracture, right    Stroke [facility])    TIA   Systemic lupus erythematosus ([place])    Thyroid disease    TIA (transient ischemic attack) 11/28/2013   Transient global amnesia 2011     Past Surgical History: Past Surgical History:  Procedure Laterality Date   ABDOMINAL HYSTERECTOMY  1981   APPENDECTOMY  1957   AUGMENTATION MAMMAPLASTY Bilateral 1990   saline submuscular   BACK SURGERY  1989   BILATERAL TOTAL MASTECTOMY WITH AXILLARY LYMPH NODE DISSECTION  1990   BREAST IMPLANT EXCHANGE Bilateral 05/11/2016   Procedure: REMOVAL AND REPLACEMENT OF BREAST IMPLANTS ;  Surgeon: Irene Limbo, MD;  Location: [place];  Service: Plastics;  Laterality: Bilateral;   CAPSULECTOMY Bilateral 05/11/2016   Procedure: CAPSULECTOMY CAPSULORRAPHY ;  Surgeon: Irene Limbo, MD;  Location: [place];  Service: Plastics;  Laterality: Bilateral;   CATARACT EXTRACTION W/PHACO Right 12/26/2018   Procedure: CATARACT EXTRACTION PHACO AND INTRAOCULAR LENS PLACEMENT ([place]) RIGHT;  Surgeon: Birder Robson, MD;  Location: [facility];  Service: Ophthalmology;  Laterality: Right;  [place]  00:46 CDE 8.50 Fluid pack lot # [id number]   CATARACT EXTRACTION W/PHACO Left 01/23/2019   Procedure: CATARACT EXTRACTION PHACO AND INTRAOCULAR LENS PLACEMENT ([place]) LEFT;  Surgeon: Birder Robson, MD;  Location: [facility];  Service: Ophthalmology;  Laterality: Left;  [place]  00:45 CDE  8.41 fluid pack lot #  [id number]   CHOLECYSTECTOMY  1979   COLONOSCOPY WITH PROPOFOL N/A 01/25/2018   Procedure: COLONOSCOPY WITH PROPOFOL;  Surgeon: Manya Silvas, MD;  Location[facility] END[facility];  Service: Endoscopy;  Laterality: N/A;   COPD     ESOPHAGOGASTRODUODENOSCOPY (EGD) WITH PROPOFOL N/A 03/07/2018   Procedure: ESOPHAGOGASTRODUODENOSCOPY (EGD) WITH PROPOFOL;  Surgeon: Manya Silvas, MD;  Location: [facility] ENDOSCOPY;  Service: Endoscopy;  Laterality: N/A;   FRACTURE SURGERY     HARDWARE REMOVAL Left 06/18/2020   Procedure: HARDWARE REMOVAL;  Surgeon: Lovell Sheehan, MD;  Location: [facility];  Service: Orthopedics;  Laterality: Left;   HUMERUS IM NAIL Left 12/31/2019   Procedure: INTRAMEDULLARY (IM) NAIL HUMERAL;  Surgeon: Lovell Sheehan, MD;  Location: [facility];  Service: Orthopedics;  Laterality: Left;   LIPOSUCTION Right 05/11/2016   Procedure: LIPOSUCTION;  Surgeon: Irene Limbo, MD;  Location: [place];  Service: Plastics;  Laterality: Right;   LIVER BIOPSY  2011   MASTECTOMY SUBCUTANEOUS Bilateral 1990   MASTOPEXY Bilateral 05/11/2016   Procedure: MASTOPEXY BILATERAL ;  Surgeon: Irene Limbo, MD;  Location: [place];  Service: Plastics;  Laterality: Bilateral;     Family History: Family History  Problem Relation Age of Onset   Atrial fibrillation Mother    Atrial fibrillation Sister    Cancer Sister    Diabetes Sister    Breast cancer Sister 61   Diabetes Father    Cancer Sister    Diabetes Sister    Atrial fibrillation Sister  Kidney disease Maternal Aunt      Social History: Social History   Socioeconomic History   Marital status: Married    Spouse name: Not on file   Number of children: 1   Years of education: college3   Highest education level: Not on file  Occupational History   Occupation: Retired  Tobacco Use   Smoking status: Former    Packs/day: 1.00    Years: 40.00    Total pack years: 40.00    Types: Cigarettes    Quit  date: 08/28/2005    Years since quitting: 16.9   Smokeless tobacco: Never  Vaping Use   Vaping Use: Never used  Substance and Sexual Activity   Alcohol use: Not Currently    Alcohol/week: 2.0 standard drinks of alcohol    Types: 2 Shots of liquor per week   Drug use: No   Sexual activity: Never  Other Topics Concern   Not on file  Social History Narrative   Not on file   Social Determinants of Health   Financial Resource Strain: Not on file  Food Insecurity: Not on file  Transportation Needs: Not on file  Physical Activity: Not on file  Stress: Not on file  Social Connections: Not on file  Intimate Partner Violence: Not on file     Review of Systems: Review of Systems  Constitutional:  Negative for chills, fever and malaise/fatigue.  HENT:  Negative for congestion, sore throat and tinnitus.   Eyes:  Negative for blurred vision and redness.  Respiratory:  Negative for cough, shortness of breath and wheezing.   Cardiovascular:  Negative for chest pain, palpitations, claudication and leg swelling.  Gastrointestinal:  Negative for abdominal pain, blood in stool, diarrhea, nausea and vomiting.  Genitourinary:  Negative for flank pain, frequency and hematuria.  Musculoskeletal:  Positive for falls. Negative for back pain and myalgias.  Skin:  Negative for rash.  Neurological:  Negative for dizziness, weakness and headaches.  Endo/Heme/Allergies:  Does not bruise/bleed easily.  Psychiatric/Behavioral:  Negative for depression. The patient is not nervous/anxious and does not have insomnia.     Vital Signs: Blood pressure (!) 105/59, pulse 76, temperature 98.5 F (36.9 C), temperature source Oral, resp. rate 15, weight 54.9 kg, SpO2 94 %.  Weight trends: Filed Weights   08/09/22 1732  Weight: 54.9 kg    Physical Exam: General: NAD, resting comfortably  Head: Normocephalic, atraumatic. Moist oral mucosal membranes  Eyes: Anicteric  Lungs:  Clear to auscultation, normal  effort, [place] O2  Heart: Regular rate and rhythm  Abdomen:  Soft, nontender, nondistended  Extremities: No peripheral edema.  Neurologic: Nonfocal, moving all four extremities  Skin: No lesions  Access: None     Lab results: Basic Metabolic Panel: Recent Labs  Lab 08/09/22 1742 08/10/22 0126  NA 137 137  K 4.2 3.9  CL 97* 102  CO2 21* 19*  GLUCOSE 109* 81  BUN 66* 66*  CREATININE 7.02* 5.98*  CALCIUM 9.4 8.7*    Liver Function Tests: Recent Labs  Lab 08/10/22 0126  AST 31  ALT 16  ALKPHOS 276*  BILITOT 0.6  PROT 6.9  ALBUMIN 3.7   No results for input(s): "LIPASE", "AMYLASE" in the last 168 hours. No results for input(s): "AMMONIA" in the last 168 hours.  CBC: Recent Labs  Lab 08/09/22 1742 08/10/22 0126  WBC 8.9 8.0  HGB 12.8 11.9*  HCT 39.8 36.9  MCV 97.1 96.6  PLT 203 197    Cardiac  Enzymes: Recent Labs  Lab 08/09/22 1742  CKTOTAL 376*    BNP: Invalid input(s): "POCBNP"  CBG: No results for input(s): "GLUCAP" in the last 168 hours.  Microbiology: Results for orders placed or performed during the hospital encounter of 04/28/22  Urine Culture     Status: Abnormal   Collection Time: 04/29/22  9:30 PM   Specimen: Urine, Clean Catch  Result Value Ref Range Status   Specimen Description   Final    URINE, CLEAN CATCH Performed at [facility], [address], [place], [place] [address]    Special Requests   Final    NONE Performed at [facility], [place], [place] [address]    Culture >=100,000 COLONIES/mL ENTEROBACTER CLOACAE (A)  Final   Report Status 05/02/2022 FINAL  Final   Organism ID, Bacteria ENTEROBACTER CLOACAE (A)  Final      Susceptibility   Enterobacter cloacae - MIC*    CEFAZOLIN >=64 RESISTANT Resistant     CEFEPIME <=0.12 SENSITIVE Sensitive     CIPROFLOXACIN <=0.25 SENSITIVE Sensitive     GENTAMICIN <=1 SENSITIVE Sensitive     IMIPENEM 2 SENSITIVE Sensitive     NITROFURANTOIN 32  SENSITIVE Sensitive     TRIMETH/SULFA <=20 SENSITIVE Sensitive     PIP/TAZO 8 SENSITIVE Sensitive     * >=100,000 COLONIES/mL ENTEROBACTER CLOACAE    Coagulation Studies: No results for input(s): "LABPROT", "INR" in the last 72 hours.  Urinalysis: Recent Labs    08/09/22 1739  COLORURINE YELLOW*  LABSPEC 1.010  PHURINE 5.0  GLUCOSEU NEGATIVE  HGBUR MODERATE*  BILIRUBINUR NEGATIVE  KETONESUR NEGATIVE  PROTEINUR NEGATIVE  NITRITE NEGATIVE  LEUKOCYTESUR LARGE*      Imaging: US RENAL  Result Date: 08/10/2022 CLINICAL DATA:  Acute renal injury EXAM: RENAL / URINARY TRACT ULTRASOUND COMPLETE COMPARISON:  None Available. FINDINGS: Right Kidney: Renal measurements: 8.0 x 4.3 x 4.2 cm. = volume: 76 mL. 9 mm cyst is noted in the lower pole. No follow-up is recommended. No mass lesion or hydronephrosis is noted. Left Kidney: Renal measurements: 8.7 x 4.3 x 3.3 cm. = volume: 66 mL. A few small cysts are noted within the left kidney measuring up to 1.2 cm. These are consistent with simple cysts. No follow-up is recommended. Bladder: Appears normal for degree of bladder distention. Other: None. IMPRESSION: Bilateral simple renal cysts. No follow-up is recommended. No other focal abnormality is noted. Electronically Signed   By: Inez Catalina M.D.   On: 08/10/2022 03:20   DG Chest 2 View  Result Date: 08/09/2022 CLINICAL DATA:  Right rib pain after fall EXAM: CHEST - 2 VIEW COMPARISON:  04/28/2022 FINDINGS: No acute airspace disease. No pleural effusion or pneumothorax. Stable cardiomediastinal silhouette. Multiple old appearing bilateral rib fractures. Old left proximal humerus fracture. IMPRESSION: No active cardiopulmonary disease. Electronically Signed   By: Donavan Foil M.D.   On: 08/09/2022 21:24   CT HEAD WO CONTRAST (5MM)  Result Date: 08/09/2022 CLINICAL DATA:  Multiple falls, head and neck trauma, unable to give clear history, leg weakness. Past history breast cancer, TIA, COPD, GERD,  stroke, lupus, hypertension EXAM: CT HEAD WITHOUT CONTRAST CT CERVICAL SPINE WITHOUT CONTRAST TECHNIQUE: Multidetector CT imaging of the head and cervical spine was performed following the standard protocol without intravenous contrast. Multiplanar CT image reconstructions of the cervical spine were also generated. RADIATION DOSE REDUCTION: This exam was performed according to the departmental dose-optimization program which includes automated exposure control, adjustment of the  mA and/or kV according to patient size and/or use of iterative reconstruction technique. COMPARISON:  CT head 04/28/2022, CT cervical spine 12/15/2021 FINDINGS: CT HEAD FINDINGS Brain: Generalized atrophy. Normal ventricular morphology. No midline shift or mass effect. Small vessel chronic ischemic changes of deep cerebral white matter. No intracranial hemorrhage, mass lesion, evidence of acute infarction, or extra-axial fluid collection. Vascular: Minimal atherosclerotic calcification of internal carotid arteries at skull base. No hyperdense vessels. Skull: Demineralized but intact Sinuses/Orbits: Small amount of mucus/fluid within RIGHT sphenoid sinus. Remaining paranasal sinuses, mastoid air cells, and middle ear cavities clear Other: N/A CT CERVICAL SPINE FINDINGS Alignment: Minimal retrolisthesis at C4-C5 and C5-C6 similar to prior exam. Remaining alignments normal. Skull base and vertebrae: Osseous demineralization. Multilevel disc space narrowing and minimal endplate spur formation. Multilevel facet degenerative changes. Skull base intact. No fracture, additional subluxation, or bone destruction. Soft tissues and spinal canal: Prevertebral soft tissues normal thickness. Mild atherosclerotic calcifications at the RIGHT carotid bifurcation. Remaining soft tissues unremarkable. Disc levels:  No specific abnormalities Upper chest: Emphysematous changes and scarring at lung apices. Other: N/A IMPRESSION: Atrophy with small vessel chronic  ischemic changes of deep cerebral white matter. No acute intracranial abnormalities. Multilevel degenerative disc and facet disease changes of the cervical spine. No acute cervical spine abnormalities. Electronically Signed   By: Lavonia Dana M.D.   On: 08/09/2022 18:54   CT Cervical Spine Wo Contrast  Result Date: 08/09/2022 CLINICAL DATA:  Multiple falls, head and neck trauma, unable to give clear history, leg weakness. Past history breast cancer, TIA, COPD, GERD, stroke, lupus, hypertension EXAM: CT HEAD WITHOUT CONTRAST CT CERVICAL SPINE WITHOUT CONTRAST TECHNIQUE: Multidetector CT imaging of the head and cervical spine was performed following the standard protocol without intravenous contrast. Multiplanar CT image reconstructions of the cervical spine were also generated. RADIATION DOSE REDUCTION: This exam was performed according to the departmental dose-optimization program which includes automated exposure control, adjustment of the mA and/or kV according to patient size and/or use of iterative reconstruction technique. COMPARISON:  CT head 04/28/2022, CT cervical spine 12/15/2021 FINDINGS: CT HEAD FINDINGS Brain: Generalized atrophy. Normal ventricular morphology. No midline shift or mass effect. Small vessel chronic ischemic changes of deep cerebral white matter. No intracranial hemorrhage, mass lesion, evidence of acute infarction, or extra-axial fluid collection. Vascular: Minimal atherosclerotic calcification of internal carotid arteries at skull base. No hyperdense vessels. Skull: Demineralized but intact Sinuses/Orbits: Small amount of mucus/fluid within RIGHT sphenoid sinus. Remaining paranasal sinuses, mastoid air cells, and middle ear cavities clear Other: N/A CT CERVICAL SPINE FINDINGS Alignment: Minimal retrolisthesis at C4-C5 and C5-C6 similar to prior exam. Remaining alignments normal. Skull base and vertebrae: Osseous demineralization. Multilevel disc space narrowing and minimal endplate  spur formation. Multilevel facet degenerative changes. Skull base intact. No fracture, additional subluxation, or bone destruction. Soft tissues and spinal canal: Prevertebral soft tissues normal thickness. Mild atherosclerotic calcifications at the RIGHT carotid bifurcation. Remaining soft tissues unremarkable. Disc levels:  No specific abnormalities Upper chest: Emphysematous changes and scarring at lung apices. Other: N/A IMPRESSION: Atrophy with small vessel chronic ischemic changes of deep cerebral white matter. No acute intracranial abnormalities. Multilevel degenerative disc and facet disease changes of the cervical spine. No acute cervical spine abnormalities. Electronically Signed   By: Lavonia Dana M.D.   On: 08/09/2022 18:54   DG Lumbar Spine Complete  Result Date: 08/09/2022 CLINICAL DATA:  Trauma, fall EXAM: LUMBAR SPINE - COMPLETE 4+ VIEW COMPARISON:  None Available. FINDINGS: No recent fracture is seen.  Alignment of posterior margins of vertebral bodies is unremarkable. Degenerative changes are noted in facet joints, more so at L5-S1 level. There are scattered arterial calcifications. Surgical clips are noted in right upper quadrant. IMPRESSION: No recent fracture is seen in the lumbar spine. Degenerative changes are noted, more so at L5-S1 level. Electronically Signed   By: Elmer Picker M.D.   On: 08/09/2022 18:45     Assessment & Plan: Donna Evans is a 76 y.o.  female with past medical conditions including stroke, GERD, diastolic heart failure, hypertension, right breast cancer with mastectomy, depression, and chronic kidney disease stage IIIa, who was admitted to [facility] on 08/09/2022 for AKI (acute kidney injury) ([place]) [N17.9]  Acute kidney injury on chronic kidney disease stage IIIa.  Baseline creatinine appears to be 1.15 with GFR 49 on 05/04/2022.  Acute kidney injury appears to be secondary to hypovolemia and poor oral intake combined with continued diuretic use.No IV  contrast exposure.  Renal ultrasound shows bilateral renal cyst without hydronephrosis.  Olmesartan and diuretics held.  Creatinine has improved with IV hydration, 5.98 today.  Agree with IV fluids.  No acute need for dialysis at this time.  Continue to avoid nephrotoxic agents and therapies.  Patient encouraged to increase oral intake.  2.  Acute metabolic acidosis, serum bicarb 21 on admission.  May consider changing IV fluids to sodium bicarb, bicarb 19 today.  3.Anemia of chronic kidney disease Lab Results  Component Value Date   HGB 11.9 (L) 08/10/2022    Hemoglobin within acceptable range.  We will continue to monitor.  4.  Hypertension with chronic kidney disease.  Home regimen includes olmesartan, hydrochlorothiazide, and torsemide.  All currently held due to kidney injury.  Blood pressure currently 105/59.  LOS: 0   8/29/20233:48 PM

## 2022-08-10 NOTE — ED Notes (Signed)
US at the bedside

## 2022-08-10 NOTE — Progress Notes (Signed)
PROGRESS NOTE   HPI was taken from Dr. Marcello Moores: Donna Evans is a 76 y.o. female with medical history significant of   hypertension, stroke, GERD,depression with anxiety, migraine headache, right breast cancer (s/p mastectomy), chronic pain syndrome, dCHF, CKD stage IIIa, history of recurrent falls in setting of djd of the spine and peripheral neuropathy,COPD who presents with complaint of increasing falls as well as symptoms of uti, stating her urine has foul odor. Patient states she has fall that was different from her other falls. She states she did not had dizziness, or chest pain , or n/v/d prior to fall. She was in her normal state of health. She states she was putting her dog out and all of a sudden felt her body go weak and she was not able to hold her weight. She states she did not feel presyncope or have any prodrome prior to fall. Currently she notes she still feels weak.  She also has complaint of foul odor to her urine. She notes no dysuria, no flank pain no blood in her urine no fevers or chills.      Donna Evans  KYH:[id number] DOB: 1946/01/25 DOA: 08/09/2022 PCP: Jodi Marble, MD    Assessment & Plan:   Principal Problem:   AKI (acute kidney injury) ([place]) Active Problems:   Elevated troponin   Urinary tract infection   Generalized anxiety disorder   Hypertension, benign   Migraine headache   Chronic diastolic CHF (congestive heart failure) ([place])   Falls frequently   HLD (hyperlipidemia)   Elevated alkaline phosphatase level  Assessment and Plan: AKI on CKDIIIa: Cr is trending down from day prior. Renal US is pending. Continue on IVFs. Nephro consulted   UTI: urine is pending. Continue on IV rocephin    Recurrent falls: PT/OT consulted    Elevated troponins: minimally elevated. Secondary to demand ischemia. Trending down  Elevated alkaline phosphatase: etiology unclear. AST, ALT & bilirubin all WNL. Will continue to monitor    HTN: holding HTN meds     Chronic diastolic CHF: continue to hold home dose of torsemide. Monitor I/Os   Hx of CVA: continue on aspirin    COPD: w/o exacerbation. Continue on bronchodilators  HLD: continue on statin    Anxiety: severity unknown. Continue on xanax    Migraine headache: on butorphanol at home prn for headaches        DVT prophylaxis: heparin  Code Status: full Family Communication:  Disposition Plan: depends on PT/OT recs   Level of care: Progressive  Status is: Inpatient Remains inpatient appropriate because: severity of illness     Consultants:  Nephro   Procedures:   Antimicrobials: rocephin   Subjective: Pt c/o malaise  Objective: Vitals:   08/10/22 0900 08/10/22 0943 08/10/22 1000 08/10/22 1300  BP: (!) 98/48  (!) 99/55 (!) 105/59  Pulse: 73 72 72 76  Resp: '13 12 12 15  '$ Temp:   98.2 F (36.8 C) 98.5 F (36.9 C)  TempSrc:   Oral Oral  SpO2: 96% 96% 98% 94%  Weight:        Intake/Output Summary (Last 24 hours) at 08/10/2022 1618 Last data filed at 08/10/2022 0306 Gross per 24 hour  Intake 100 ml  Output 400 ml  Net -300 ml   Filed Weights   08/09/22 1732  Weight: 54.9 kg    Examination:  General exam: Appears calm and comfortable  Respiratory system: Clear to auscultation. Respiratory effort normal. Cardiovascular system: S1 & S2 +.  No  rubs, gallops or clicks.  Gastrointestinal system: Abdomen is nondistended, soft and nontender.  Normal bowel sounds heard. Central nervous system: Alert and oriented. Moves all extremities  Psychiatry: Judgement and insight appear normal. Flat mood and affect     Data Reviewed: I have personally reviewed following labs and imaging studies  CBC: Recent Labs  Lab 08/09/22 1742 08/10/22 0126  WBC 8.9 8.0  HGB 12.8 11.9*  HCT 39.8 36.9  MCV 97.1 96.6  PLT 203 160   Basic Metabolic Panel: Recent Labs  Lab 08/09/22 1742 08/10/22 0126  NA 137 137  K 4.2 3.9  CL 97* 102  CO2 21* 19*  GLUCOSE 109* 81   BUN 66* 66*  CREATININE 7.02* 5.98*  CALCIUM 9.4 8.7*   GFR: Estimated Creatinine Clearance: 6.9 mL/min (A) (by C-G formula based on SCr of 5.98 mg/dL (H)). Liver Function Tests: Recent Labs  Lab 08/10/22 0126  AST 31  ALT 16  ALKPHOS 276*  BILITOT 0.6  PROT 6.9  ALBUMIN 3.7   No results for input(s): "LIPASE", "AMYLASE" in the last 168 hours. No results for input(s): "AMMONIA" in the last 168 hours. Coagulation Profile: No results for input(s): "INR", "PROTIME" in the last 168 hours. Cardiac Enzymes: Recent Labs  Lab 08/09/22 1742  CKTOTAL 376*   BNP (last 3 results) No results for input(s): "PROBNP" in the last 8760 hours. HbA1C: No results for input(s): "HGBA1C" in the last 72 hours. CBG: No results for input(s): "GLUCAP" in the last 168 hours. Lipid Profile: No results for input(s): "CHOL", "HDL", "LDLCALC", "TRIG", "CHOLHDL", "LDLDIRECT" in the last 72 hours. Thyroid Function Tests: No results for input(s): "TSH", "T4TOTAL", "FREET4", "T3FREE", "THYROIDAB" in the last 72 hours. Anemia Panel: No results for input(s): "VITAMINB12", "FOLATE", "FERRITIN", "TIBC", "IRON", "RETICCTPCT" in the last 72 hours. Sepsis Labs: No results for input(s): "PROCALCITON", "LATICACIDVEN" in the last 168 hours.  No results found for this or any previous visit (from the past 240 hour(s)).       Radiology Studies: US RENAL  Result Date: 08/10/2022 CLINICAL DATA:  Acute renal injury EXAM: RENAL / URINARY TRACT ULTRASOUND COMPLETE COMPARISON:  None Available. FINDINGS: Right Kidney: Renal measurements: 8.0 x 4.3 x 4.2 cm. = volume: 76 mL. 9 mm cyst is noted in the lower pole. No follow-up is recommended. No mass lesion or hydronephrosis is noted. Left Kidney: Renal measurements: 8.7 x 4.3 x 3.3 cm. = volume: 66 mL. A few small cysts are noted within the left kidney measuring up to 1.2 cm. These are consistent with simple cysts. No follow-up is recommended. Bladder: Appears normal for  degree of bladder distention. Other: None. IMPRESSION: Bilateral simple renal cysts. No follow-up is recommended. No other focal abnormality is noted. Electronically Signed   By: Inez Catalina M.D.   On: 08/10/2022 03:20   DG Chest 2 View  Result Date: 08/09/2022 CLINICAL DATA:  Right rib pain after fall EXAM: CHEST - 2 VIEW COMPARISON:  04/28/2022 FINDINGS: No acute airspace disease. No pleural effusion or pneumothorax. Stable cardiomediastinal silhouette. Multiple old appearing bilateral rib fractures. Old left proximal humerus fracture. IMPRESSION: No active cardiopulmonary disease. Electronically Signed   By: Donavan Foil M.D.   On: 08/09/2022 21:24   CT HEAD WO CONTRAST (5MM)  Result Date: 08/09/2022 CLINICAL DATA:  Multiple falls, head and neck trauma, unable to give clear history, leg weakness. Past history breast cancer, TIA, COPD, GERD, stroke, lupus, hypertension EXAM: CT HEAD WITHOUT CONTRAST CT CERVICAL SPINE  WITHOUT CONTRAST TECHNIQUE: Multidetector CT imaging of the head and cervical spine was performed following the standard protocol without intravenous contrast. Multiplanar CT image reconstructions of the cervical spine were also generated. RADIATION DOSE REDUCTION: This exam was performed according to the departmental dose-optimization program which includes automated exposure control, adjustment of the mA and/or kV according to patient size and/or use of iterative reconstruction technique. COMPARISON:  CT head 04/28/2022, CT cervical spine 12/15/2021 FINDINGS: CT HEAD FINDINGS Brain: Generalized atrophy. Normal ventricular morphology. No midline shift or mass effect. Small vessel chronic ischemic changes of deep cerebral white matter. No intracranial hemorrhage, mass lesion, evidence of acute infarction, or extra-axial fluid collection. Vascular: Minimal atherosclerotic calcification of internal carotid arteries at skull base. No hyperdense vessels. Skull: Demineralized but intact  Sinuses/Orbits: Small amount of mucus/fluid within RIGHT sphenoid sinus. Remaining paranasal sinuses, mastoid air cells, and middle ear cavities clear Other: N/A CT CERVICAL SPINE FINDINGS Alignment: Minimal retrolisthesis at C4-C5 and C5-C6 similar to prior exam. Remaining alignments normal. Skull base and vertebrae: Osseous demineralization. Multilevel disc space narrowing and minimal endplate spur formation. Multilevel facet degenerative changes. Skull base intact. No fracture, additional subluxation, or bone destruction. Soft tissues and spinal canal: Prevertebral soft tissues normal thickness. Mild atherosclerotic calcifications at the RIGHT carotid bifurcation. Remaining soft tissues unremarkable. Disc levels:  No specific abnormalities Upper chest: Emphysematous changes and scarring at lung apices. Other: N/A IMPRESSION: Atrophy with small vessel chronic ischemic changes of deep cerebral white matter. No acute intracranial abnormalities. Multilevel degenerative disc and facet disease changes of the cervical spine. No acute cervical spine abnormalities. Electronically Signed   By: Lavonia Dana M.D.   On: 08/09/2022 18:54   CT Cervical Spine Wo Contrast  Result Date: 08/09/2022 CLINICAL DATA:  Multiple falls, head and neck trauma, unable to give clear history, leg weakness. Past history breast cancer, TIA, COPD, GERD, stroke, lupus, hypertension EXAM: CT HEAD WITHOUT CONTRAST CT CERVICAL SPINE WITHOUT CONTRAST TECHNIQUE: Multidetector CT imaging of the head and cervical spine was performed following the standard protocol without intravenous contrast. Multiplanar CT image reconstructions of the cervical spine were also generated. RADIATION DOSE REDUCTION: This exam was performed according to the departmental dose-optimization program which includes automated exposure control, adjustment of the mA and/or kV according to patient size and/or use of iterative reconstruction technique. COMPARISON:  CT head  04/28/2022, CT cervical spine 12/15/2021 FINDINGS: CT HEAD FINDINGS Brain: Generalized atrophy. Normal ventricular morphology. No midline shift or mass effect. Small vessel chronic ischemic changes of deep cerebral white matter. No intracranial hemorrhage, mass lesion, evidence of acute infarction, or extra-axial fluid collection. Vascular: Minimal atherosclerotic calcification of internal carotid arteries at skull base. No hyperdense vessels. Skull: Demineralized but intact Sinuses/Orbits: Small amount of mucus/fluid within RIGHT sphenoid sinus. Remaining paranasal sinuses, mastoid air cells, and middle ear cavities clear Other: N/A CT CERVICAL SPINE FINDINGS Alignment: Minimal retrolisthesis at C4-C5 and C5-C6 similar to prior exam. Remaining alignments normal. Skull base and vertebrae: Osseous demineralization. Multilevel disc space narrowing and minimal endplate spur formation. Multilevel facet degenerative changes. Skull base intact. No fracture, additional subluxation, or bone destruction. Soft tissues and spinal canal: Prevertebral soft tissues normal thickness. Mild atherosclerotic calcifications at the RIGHT carotid bifurcation. Remaining soft tissues unremarkable. Disc levels:  No specific abnormalities Upper chest: Emphysematous changes and scarring at lung apices. Other: N/A IMPRESSION: Atrophy with small vessel chronic ischemic changes of deep cerebral white matter. No acute intracranial abnormalities. Multilevel degenerative disc and facet disease changes of the  cervical spine. No acute cervical spine abnormalities. Electronically Signed   By: Lavonia Dana M.D.   On: 08/09/2022 18:54   DG Lumbar Spine Complete  Result Date: 08/09/2022 CLINICAL DATA:  Trauma, fall EXAM: LUMBAR SPINE - COMPLETE 4+ VIEW COMPARISON:  None Available. FINDINGS: No recent fracture is seen. Alignment of posterior margins of vertebral bodies is unremarkable. Degenerative changes are noted in facet joints, more so at L5-S1  level. There are scattered arterial calcifications. Surgical clips are noted in right upper quadrant. IMPRESSION: No recent fracture is seen in the lumbar spine. Degenerative changes are noted, more so at L5-S1 level. Electronically Signed   By: Elmer Picker M.D.   On: 08/09/2022 18:45        Scheduled Meds:  ALPRAZolam  2 mg Oral BID   aspirin EC  81 mg Oral Daily   heparin  5,000 Units Subcutaneous Q8H   rosuvastatin  10 mg Oral Daily   Continuous Infusions:  sodium chloride 75 mL/hr at 08/10/22 0239   cefTRIAXone (ROCEPHIN)  IV       LOS: 0 days    Time spent: 35 mins     Wyvonnia Dusky, MD [facility] Pager [phone]  If 7PM-7AM, please contact night-coverage [email or web address] 08/10/2022, 4:18 PM

## 2022-08-11 DIAGNOSIS — N179 Acute kidney failure, unspecified: Secondary | ICD-10-CM | POA: Diagnosis not present

## 2022-08-11 LAB — CBC
HCT: 32.7 % — ABNORMAL LOW (ref 36.0–46.0)
Hemoglobin: 10.8 g/dL — ABNORMAL LOW (ref 12.0–15.0)
MCH: 31.4 pg (ref 26.0–34.0)
MCHC: 33 g/dL (ref 30.0–36.0)
MCV: 95.1 fL (ref 80.0–100.0)
Platelets: 174 10*3/uL (ref 150–400)
RBC: 3.44 MIL/uL — ABNORMAL LOW (ref 3.87–5.11)
RDW: 13.4 % (ref 11.5–15.5)
WBC: 4.3 10*3/uL (ref 4.0–10.5)
nRBC: 0 % (ref 0.0–0.2)

## 2022-08-11 LAB — COMPREHENSIVE METABOLIC PANEL
ALT: 16 U/L (ref 0–44)
AST: 36 U/L (ref 15–41)
Albumin: 3.1 g/dL — ABNORMAL LOW (ref 3.5–5.0)
Alkaline Phosphatase: 261 U/L — ABNORMAL HIGH (ref 38–126)
Anion gap: 9 (ref 5–15)
BUN: 56 mg/dL — ABNORMAL HIGH (ref 8–23)
CO2: 22 mmol/L (ref 22–32)
Calcium: 8.5 mg/dL — ABNORMAL LOW (ref 8.9–10.3)
Chloride: 109 mmol/L (ref 98–111)
Creatinine, Ser: 3.26 mg/dL — ABNORMAL HIGH (ref 0.44–1.00)
GFR, Estimated: 14 mL/min — ABNORMAL LOW (ref >60)
Glucose, Bld: 87 mg/dL (ref 70–99)
Potassium: 3.7 mmol/L (ref 3.5–5.1)
Sodium: 140 mmol/L (ref 135–145)
Total Bilirubin: 0.4 mg/dL (ref 0.3–1.2)
Total Protein: 6 g/dL — ABNORMAL LOW (ref 6.5–8.1)

## 2022-08-11 MED ORDER — SODIUM CHLORIDE 0.9 % IV SOLN: INTRAVENOUS | Status: DC

## 2022-08-11 MED ORDER — LIDOCAINE 5 % EX PTCH
1.0000 | MEDICATED_PATCH | CUTANEOUS | Status: DC
  Administered 2022-08-11 – 2022-08-12 (×2): 1 via TRANSDERMAL
  Filled 2022-08-11 (×3): qty 1

## 2022-08-11 NOTE — Evaluation (Signed)
Occupational Therapy Evaluation Patient Details Name: Donna Evans MRN: [id number] DOB: [birth year] Today's Date: 08/11/2022   History of Present Illness Pt is a 76 yo female that was admitted for AKI and frequent falls. PMH of CVA, GERD, diastolic heart failure, HTN, R breast cancer, CKDIII.   Clinical Impression   Donna Evans presents with generalized weakness, limited endurance, impaired balance, and pain. She lives alone in a single-story home, 3 STE, uses a RW or rollator at baseline, does not drive, performs most ADLs with Mod I, relies on friends and family for shopping, cooking, cleaning, and transportation. Pt reports falling very frequently at home -- often on a daily basis. During today's evaluation, pt is A&Ox4, pleasant and talkative, eager to participate in OT session. She endorses 7/10 pain in her lower back. Pt is able to perform bed mobility with SUPV, requiring extra time/effort for these movements. She is able to sit EOB with good balance using 1 UE on mattress for support. Pt able to transfer sit<>stand multiple times without physical assistance and without fatiguing; however, she becomes fatigued quickly in standing, needs to return to seated position after ~45-60 seconds in standing. Pt able to perform UB and LB dressing in sitting with Mod I, transfers to chair/toilet with CGA for safety, 2/2 impaired balance with OOB fxl mobility. She demonstrates good safety awareness and proper use of RW. Pt reports having 2 L shoulder surgeries in previous year, after injuring shoulder in a fall. She has limited ROM and reduced strength in L shoulder; elbow and wrist MMT WFL. Provided educ re: falls prevention, home modifications, pain mgmt, anxiety mgmt. Pt verbalizes understanding. Recommend ongoing OT while hospitalized. Pt is currently receiving HHOT and PT and reports her strength and balance have improved in recent months w/ these therapies. Recommend DC home, with continuing [place].      Recommendations for follow up therapy are one component of a multi-disciplinary discharge planning process, led by the attending physician.  Recommendations may be updated based on patient status, additional functional criteria and insurance authorization.   Follow Up Recommendations  Home health OT    Assistance Recommended at Discharge Intermittent Supervision/Assistance  Patient can return home with the following A little help with walking and/or transfers;A little help with bathing/dressing/bathroom;Assistance with cooking/housework;Assist for transportation    Functional Status Assessment  Patient has had a recent decline in their functional status and demonstrates the ability to make significant improvements in function in a reasonable and predictable amount of time.  Equipment Recommendations  None recommended by OT    Recommendations for Other Services       Precautions / Restrictions Precautions Precautions: Fall Restrictions Weight Bearing Restrictions: No      Mobility Bed Mobility Overal bed mobility: Needs Assistance Bed Mobility: Supine to Sit, Sit to Supine     Supine to sit: Supervision, HOB elevated Sit to supine: Supervision   General bed mobility comments: extra time, effort    Transfers Overall transfer level: Needs assistance Equipment used: Rolling walker (2 wheels) Transfers: Sit to/from Stand Sit to Stand: Supervision                  Balance Overall balance assessment: Needs assistance Sitting-balance support: Feet supported, Single extremity supported Sitting balance-Leahy Scale: Good Sitting balance - Comments: requires at least single UE support   Standing balance support: Reliant on assistive device for balance Standing balance-Leahy Scale: Fair Standing balance comment: becomes fatigued after ~ 1 minute standing  ADL either performed or assessed with clinical judgement   ADL Overall  ADL's : Needs assistance/impaired                     Lower Body Dressing: Modified independent;Sitting/lateral leans   Toilet Transfer: Min guard   Toileting- Clothing Manipulation and Hygiene: Min guard       Functional mobility during ADLs: Min guard       Vision         Perception     Praxis      Pertinent Vitals/Pain Pain Assessment Pain Score: 7  Pain Location: lower back, more significant on R Pain Descriptors / Indicators: Discomfort, Grimacing, Guarding, Aching Pain Intervention(s): Limited activity within patient's tolerance, Repositioned, Utilized relaxation techniques     Hand Dominance Right   Extremity/Trunk Assessment Upper Extremity Assessment Upper Extremity Assessment: Generalized weakness;LUE deficits/detail LUE Deficits / Details: limited strength, ROM, secondary to fall/fx and 2 surgeries in previous 8 months   Lower Extremity Assessment Lower Extremity Assessment: Generalized weakness   Cervical / Trunk Assessment Cervical / Trunk Assessment: Normal   Communication Communication Communication: No difficulties   Cognition Arousal/Alertness: Awake/alert Behavior During Therapy: WFL for tasks assessed/performed Overall Cognitive Status: Within Functional Limits for tasks assessed                                       General Comments       Exercises Other Exercises Other Exercises: Educ re: falls prevention, home modifications, non-pharmacological pain mgmt strategies, DC recs   Shoulder Instructions      Home Living Family/patient expects to be discharged to:: Private residence Living Arrangements: Alone Available Help at Discharge: Family;Available PRN/intermittently;Friend(s) Type of Home: House Home Access: Stairs to enter [employer] of Steps: 3-4 Entrance Stairs-Rails: Right;Left Home Layout: One level     Bathroom Shower/Tub: Teacher, early years/pre: Handicapped height      Home Equipment: Shower seat;Grab bars - toilet;Rollator (4 wheels);Cane - single Barista (2 wheels)          Prior Functioning/Environment Prior Level of Function : Needs assist;History of Falls (last six months)             Mobility Comments: Uses rollator or RW for ambulation. Frequent falls. ADLs Comments: Pt performs own ADLs, only showers when someone else present in house. Cousin comes 1x/wk for shopping, cooking, cleaning. Friends provide transporation. Pt does not cook -- only uses microwave        OT Problem List: Decreased strength;Decreased range of motion;Decreased activity tolerance;Impaired balance (sitting and/or standing);Pain;Impaired UE functional use      OT Treatment/Interventions: Self-care/ADL training;DME and/or AE instruction;Therapeutic activities;Balance training;Therapeutic exercise;Energy conservation;Patient/family education    OT Goals(Current goals can be found in the care plan section) Acute Rehab OT Goals Patient Stated Goal: to not fall OT Goal Formulation: With patient Time For Goal Achievement: 08/25/22 Potential to Achieve Goals: Good ADL Goals Pt Will Perform Tub/Shower Transfer: shower seat;Stand pivot transfer;with supervision;rolling walker Pt/caregiver will Perform Home Exercise Program: Increased ROM;Increased strength;Independently;With written HEP provided (for strength, flexibility, pain mgmt, stress/anxiety mgmt) Additional ADL Goal #1: Pt will identify/demonstrate 2+ falls prevention strategies  OT Frequency: Min 2X/week    Co-evaluation              AM-PAC OT "6 Clicks" Daily Activity     Outcome Measure Help from another  person eating meals?: None Help from another person taking care of personal grooming?: None Help from another person toileting, which includes using toliet, bedpan, or urinal?: A Little Help from another person bathing (including washing, rinsing, drying)?: A Little Help from another  person to put on and taking off regular upper body clothing?: A Little Help from another person to put on and taking off regular lower body clothing?: A Little 6 Click Score: 20   End of Session Equipment Utilized During Treatment: Rolling walker (2 wheels)  Activity Tolerance: Patient tolerated treatment well Patient left: in bed;with call bell/phone within reach  OT Visit Diagnosis: Repeated falls (R29.6);Muscle weakness (generalized) (M62.81)                Time: 1834-3735 OT Time Calculation (min): 36 min Charges:  OT General Charges $OT Visit: 1 Visit OT Evaluation $OT Eval Moderate Complexity: 1 Mod OT Treatments $Self Care/Home Management : 23-37 mins Josiah Lobo, PhD, MS, OTR/L 08/11/22, 1:38 PM

## 2022-08-11 NOTE — Plan of Care (Signed)
  Problem: Education: Goal: Knowledge of General Education information will improve Description: Including pain rating scale, medication(s)/side effects and non-pharmacologic comfort measures Outcome: Progressing   Problem: Health Behavior/Discharge Planning: Goal: Ability to manage health-related needs will improve Outcome: Progressing   Problem: Clinical Measurements: Goal: Ability to maintain clinical measurements within normal limits will improve Outcome: Progressing Goal: Will remain free from infection Outcome: Progressing Goal: Diagnostic test results will improve Outcome: Progressing Goal: Respiratory complications will improve Outcome: Progressing Goal: Cardiovascular complication will be avoided Outcome: Progressing   Problem: Pain Managment: Goal: General experience of comfort will improve Outcome: Progressing   Problem: Elimination: Goal: Will not experience complications related to bowel motility Outcome: Progressing Goal: Will not experience complications related to urinary retention Outcome: Progressing   Problem: Health Behavior/Discharge Planning: Goal: Ability to manage health-related needs will improve Outcome: Progressing   Problem: Education: Goal: Knowledge of General Education information will improve Description: Including pain rating scale, medication(s)/side effects and non-pharmacologic comfort measures Outcome: Progressing   Problem: Skin Integrity: Goal: Risk for impaired skin integrity will decrease Outcome: Progressing   Problem: Elimination: Goal: Will not experience complications related to bowel motility Outcome: Progressing Goal: Will not experience complications related to urinary retention Outcome: Progressing   Problem: Coping: Goal: Level of anxiety will decrease Outcome: Progressing

## 2022-08-11 NOTE — Evaluation (Signed)
Physical Therapy Evaluation Patient Details Name: Donna Evans MRN: [id number] DOB: 06/19/1946 Today's Date: 08/11/2022  History of Present Illness  Pt is a 76 yo female that was admitted for AKI. PMH of CVA, GERD, diastolic heart failure, HTN, R breast cancer, CKDIII.   Clinical Impression  Pt alert and oriented to self, place, situation. Reported after her last hospital stay she went to SNF for 11 days and would prefer to go home this time since it was not a great stay per her report. At baseline pt lives alone but has family/friends that check in, provide IADLs/transportation as needed. Pt utilized rollator for all mobility.  She performed supine to sit with supervision, good sitting balance noted. Sit <> Stand with RW and supervision. Pt did need 2 attempts to come up into standing but completed without physical assistance. She ambulated ~19f with RW and CGA, slow and cautious. No LOB noted, pt did report some leg fatigue. Demonstrated home HEP in standing (was receiving HHPT).  Overall the patient demonstrated deficits (see "PT Problem List") that impede the patient's functional abilities, safety, and mobility and would benefit from skilled PT intervention. Recommendation at this time is HHPT to maximize function and decrease risk of falls with intermittent supervision/assistance.        Recommendations for follow up therapy are one component of a multi-disciplinary discharge planning process, led by the attending physician.  Recommendations may be updated based on patient status, additional functional criteria and insurance authorization.  Follow Up Recommendations Home health PT      Assistance Recommended at Discharge Intermittent Supervision/Assistance  Patient can return home with the following  Assistance with cooking/housework;Assist for transportation;Help with stairs or ramp for entrance;Direct supervision/assist for medications management    Equipment Recommendations None  recommended by PT  Recommendations for Other Services       Functional Status Assessment Patient has had a recent decline in their functional status and demonstrates the ability to make significant improvements in function in a reasonable and predictable amount of time.     Precautions / Restrictions Precautions Precautions: Fall Restrictions Weight Bearing Restrictions: No      Mobility  Bed Mobility Overal bed mobility: Needs Assistance Bed Mobility: Supine to Sit, Sit to Supine     Supine to sit: Supervision, HOB elevated Sit to supine: Supervision        Transfers Overall transfer level: Needs assistance Equipment used: Rolling walker (2 wheels) Transfers: Sit to/from Stand Sit to Stand: Supervision           General transfer comment: gathered momentum but able to complete without physical assistance    Ambulation/Gait Ambulation/Gait assistance: Min guard Gait Distance (Feet): 170 Feet Assistive device: Rolling walker (2 wheels)         General Gait Details: no LOB, overall careful with RW. endorsed it is different than her normal walker  Stairs            Wheelchair Mobility    Modified Rankin (Stroke Patients Only)       Balance Overall balance assessment: Needs assistance Sitting-balance support: Feet supported Sitting balance-Leahy Scale: Good     Standing balance support: Reliant on assistive device for balance Standing balance-Leahy Scale: Fair                               Pertinent Vitals/Pain Pain Assessment Pain Assessment: Faces Faces Pain Scale: Hurts a little bit Pain Location: IV  site Pain Descriptors / Indicators: Grimacing Pain Intervention(s): Limited activity within patient's tolerance, Repositioned, Monitored during session    [place] expects to be discharged to:: Private residence Living Arrangements: Alone Available Help at Discharge: Family;Available  PRN/intermittently;Friend(s) Type of Home: House Home Access: Stairs to enter Entrance Stairs-Rails: Psychiatric nurse of Steps: 3-4   Home Layout: One level Home Equipment: Shower seat;Grab bars - toilet;Rollator (4 wheels);Cane - single point      Prior Function               Mobility Comments: uses rollator at all times. ADLs Comments: sister (?) comes once a week to help with groceries, cooking, cleaning. pt does dressing/bathing modI/I     Hand Dominance   Dominant Hand: Right    Extremity/Trunk Assessment   Upper Extremity Assessment Upper Extremity Assessment: Generalized weakness    Lower Extremity Assessment Lower Extremity Assessment: Generalized weakness    Cervical / Trunk Assessment Cervical / Trunk Assessment: Kyphotic  Communication   Communication: No difficulties  Cognition Arousal/Alertness: Awake/alert Behavior During Therapy: WFL for tasks assessed/performed Overall Cognitive Status: Within Functional Limits for tasks assessed                                          General Comments      Exercises     Assessment/Plan    PT Assessment Patient needs continued PT services  PT Problem List Decreased strength;Decreased mobility;Decreased activity tolerance;Decreased balance       PT Treatment Interventions DME instruction;Therapeutic exercise;Gait training;Balance training;Stair training;Neuromuscular re-education;Functional mobility training;Therapeutic activities;Patient/family education    PT Goals (Current goals can be found in the Care Plan section)  Acute Rehab PT Goals Patient Stated Goal: to go home PT Goal Formulation: With patient Time For Goal Achievement: 08/25/22 Potential to Achieve Goals: Good    Frequency Min 2X/week     Co-evaluation               AM-PAC PT "6 Clicks" Mobility  Outcome Measure Help needed turning from your back to your side while in a flat bed without  using bedrails?: None Help needed moving from lying on your back to sitting on the side of a flat bed without using bedrails?: None Help needed moving to and from a bed to a chair (including a wheelchair)?: None Help needed standing up from a chair using your arms (e.g., wheelchair or bedside chair)?: None Help needed to walk in hospital room?: None Help needed climbing 3-5 steps with a railing? : A Little 6 Click Score: 23    End of Session Equipment Utilized During Treatment: Gait belt Activity Tolerance: Patient tolerated treatment well Patient left: in bed;with call bell/phone within reach;with bed alarm set Nurse Communication: Mobility status PT Visit Diagnosis: Other abnormalities of gait and mobility (R26.89);Difficulty in walking, not elsewhere classified (R26.2);Muscle weakness (generalized) (M62.81);History of falling (Z91.81)    Time: 1025-8527 PT Time Calculation (min) (ACUTE ONLY): 19 min   Charges:   PT Evaluation $PT Eval Low Complexity: 1 Low PT Treatments $Therapeutic Activity: 8-22 mins        Lieutenant Diego PT, DPT 10:52 AM,08/11/22

## 2022-08-11 NOTE — Progress Notes (Signed)
[facility]  ROUNDING NOTE   Subjective:   Patient seen resting in bed, family friend at bedside Alert and oriented Patient stated appetite slowly improving. Remains on room air with no lower extremity edema  Objective:  Vital signs in last 24 hours:  Temp:  [98 F (36.7 C)-98.6 F (37 C)] 98 F (36.7 C) (08/30 1834) Pulse Rate:  [70-79] 73 (08/30 1700) Resp:  [16-22] 16 (08/30 1600) BP: (97-129)/(40-73) 97/73 (08/30 1700) SpO2:  [95 %-99 %] 96 % (08/30 1700)  Weight change:  Filed Weights   08/09/22 1732  Weight: 54.9 kg    Intake/Output: I/O last 3 completed shifts: In: 100 [IV Piggyback:100] Out: 1400 [Urine:1400]   Intake/Output this shift:  No intake/output data recorded.  Physical Exam: General: NAD, resting in bed  Head: Normocephalic, atraumatic. Moist oral mucosal membranes  Eyes: Anicteric  Neck: Supple  Lungs:  Clear to auscultation, normal effort, room air  Heart: Regular rate and rhythm  Abdomen:  Soft, nontender, nondistended  Extremities: No peripheral edema.  Neurologic: Nonfocal, moving all four extremities  Skin: No lesions  Access: None    Basic Metabolic Panel: Recent Labs  Lab 08/09/22 1742 08/10/22 0126 08/11/22 0338  NA 137 137 140  K 4.2 3.9 3.7  CL 97* 102 109  CO2 21* 19* 22  GLUCOSE 109* 81 87  BUN 66* 66* 56*  CREATININE 7.02* 5.98* 3.26*  CALCIUM 9.4 8.7* 8.5*    Liver Function Tests: Recent Labs  Lab 08/10/22 0126 08/11/22 0338  AST 31 36  ALT 16 16  ALKPHOS 276* 261*  BILITOT 0.6 0.4  PROT 6.9 6.0*  ALBUMIN 3.7 3.1*   No results for input(s): "LIPASE", "AMYLASE" in the last 168 hours. No results for input(s): "AMMONIA" in the last 168 hours.  CBC: Recent Labs  Lab 08/09/22 1742 08/10/22 0126 08/11/22 0338  WBC 8.9 8.0 4.3  HGB 12.8 11.9* 10.8*  HCT 39.8 36.9 32.7*  MCV 97.1 96.6 95.1  PLT 203 197 174    Cardiac Enzymes: Recent Labs  Lab 08/09/22 1742  CKTOTAL 376*     BNP: Invalid input(s): "POCBNP"  CBG: No results for input(s): "GLUCAP" in the last 168 hours.  Microbiology: Results for orders placed or performed during the hospital encounter of 08/09/22  Urine Culture     Status: Abnormal (Preliminary result)   Collection Time: 08/09/22  5:39 PM   Specimen: Urine, Clean Catch  Result Value Ref Range Status   Specimen Description   Final    URINE, CLEAN CATCH Performed at [facility], [address] [place], [place] [address]    Special Requests   Final    NONE Performed at [facility], [place], [place] [address]    Culture (A)  Final    40,000 COLONIES/mL KLEBSIELLA PNEUMONIAE CULTURE REINCUBATED FOR BETTER GROWTH SUSCEPTIBILITIES TO FOLLOW Performed at [facility], [place] [address], [place], [place] [address]    Report Status PENDING  Incomplete    Coagulation Studies: No results for input(s): "LABPROT", "INR" in the last 72 hours.  Urinalysis: Recent Labs    08/09/22 1739  COLORURINE YELLOW*  LABSPEC 1.010  PHURINE 5.0  GLUCOSEU NEGATIVE  HGBUR MODERATE*  BILIRUBINUR NEGATIVE  KETONESUR NEGATIVE  PROTEINUR NEGATIVE  NITRITE NEGATIVE  LEUKOCYTESUR LARGE*      Imaging: US RENAL  Result Date: 08/10/2022 CLINICAL DATA:  Acute renal injury EXAM: RENAL / URINARY TRACT ULTRASOUND COMPLETE COMPARISON:  None  Available. FINDINGS: Right Kidney: Renal measurements: 8.0 x 4.3 x 4.2 cm. = volume: 76 mL. 9 mm cyst is noted in the lower pole. No follow-up is recommended. No mass lesion or hydronephrosis is noted. Left Kidney: Renal measurements: 8.7 x 4.3 x 3.3 cm. = volume: 66 mL. A few small cysts are noted within the left kidney measuring up to 1.2 cm. These are consistent with simple cysts. No follow-up is recommended. Bladder: Appears normal for degree of bladder distention. Other: None. IMPRESSION: Bilateral simple renal cysts. No follow-up is recommended. No other focal abnormality is  noted. Electronically Signed   By: Inez Catalina M.D.   On: 08/10/2022 03:20   DG Chest 2 View  Result Date: 08/09/2022 CLINICAL DATA:  Right rib pain after fall EXAM: CHEST - 2 VIEW COMPARISON:  04/28/2022 FINDINGS: No acute airspace disease. No pleural effusion or pneumothorax. Stable cardiomediastinal silhouette. Multiple old appearing bilateral rib fractures. Old left proximal humerus fracture. IMPRESSION: No active cardiopulmonary disease. Electronically Signed   By: Donavan Foil M.D.   On: 08/09/2022 21:24   CT HEAD WO CONTRAST (5MM)  Result Date: 08/09/2022 CLINICAL DATA:  Multiple falls, head and neck trauma, unable to give clear history, leg weakness. Past history breast cancer, TIA, COPD, GERD, stroke, lupus, hypertension EXAM: CT HEAD WITHOUT CONTRAST CT CERVICAL SPINE WITHOUT CONTRAST TECHNIQUE: Multidetector CT imaging of the head and cervical spine was performed following the standard protocol without intravenous contrast. Multiplanar CT image reconstructions of the cervical spine were also generated. RADIATION DOSE REDUCTION: This exam was performed according to the departmental dose-optimization program which includes automated exposure control, adjustment of the mA and/or kV according to patient size and/or use of iterative reconstruction technique. COMPARISON:  CT head 04/28/2022, CT cervical spine 12/15/2021 FINDINGS: CT HEAD FINDINGS Brain: Generalized atrophy. Normal ventricular morphology. No midline shift or mass effect. Small vessel chronic ischemic changes of deep cerebral white matter. No intracranial hemorrhage, mass lesion, evidence of acute infarction, or extra-axial fluid collection. Vascular: Minimal atherosclerotic calcification of internal carotid arteries at skull base. No hyperdense vessels. Skull: Demineralized but intact Sinuses/Orbits: Small amount of mucus/fluid within RIGHT sphenoid sinus. Remaining paranasal sinuses, mastoid air cells, and middle ear cavities clear  Other: N/A CT CERVICAL SPINE FINDINGS Alignment: Minimal retrolisthesis at C4-C5 and C5-C6 similar to prior exam. Remaining alignments normal. Skull base and vertebrae: Osseous demineralization. Multilevel disc space narrowing and minimal endplate spur formation. Multilevel facet degenerative changes. Skull base intact. No fracture, additional subluxation, or bone destruction. Soft tissues and spinal canal: Prevertebral soft tissues normal thickness. Mild atherosclerotic calcifications at the RIGHT carotid bifurcation. Remaining soft tissues unremarkable. Disc levels:  No specific abnormalities Upper chest: Emphysematous changes and scarring at lung apices. Other: N/A IMPRESSION: Atrophy with small vessel chronic ischemic changes of deep cerebral white matter. No acute intracranial abnormalities. Multilevel degenerative disc and facet disease changes of the cervical spine. No acute cervical spine abnormalities. Electronically Signed   By: Lavonia Dana M.D.   On: 08/09/2022 18:54   CT Cervical Spine Wo Contrast  Result Date: 08/09/2022 CLINICAL DATA:  Multiple falls, head and neck trauma, unable to give clear history, leg weakness. Past history breast cancer, TIA, COPD, GERD, stroke, lupus, hypertension EXAM: CT HEAD WITHOUT CONTRAST CT CERVICAL SPINE WITHOUT CONTRAST TECHNIQUE: Multidetector CT imaging of the head and cervical spine was performed following the standard protocol without intravenous contrast. Multiplanar CT image reconstructions of the cervical spine were also generated. RADIATION DOSE REDUCTION: This exam was  performed according to the departmental dose-optimization program which includes automated exposure control, adjustment of the mA and/or kV according to patient size and/or use of iterative reconstruction technique. COMPARISON:  CT head 04/28/2022, CT cervical spine 12/15/2021 FINDINGS: CT HEAD FINDINGS Brain: Generalized atrophy. Normal ventricular morphology. No midline shift or mass  effect. Small vessel chronic ischemic changes of deep cerebral white matter. No intracranial hemorrhage, mass lesion, evidence of acute infarction, or extra-axial fluid collection. Vascular: Minimal atherosclerotic calcification of internal carotid arteries at skull base. No hyperdense vessels. Skull: Demineralized but intact Sinuses/Orbits: Small amount of mucus/fluid within RIGHT sphenoid sinus. Remaining paranasal sinuses, mastoid air cells, and middle ear cavities clear Other: N/A CT CERVICAL SPINE FINDINGS Alignment: Minimal retrolisthesis at C4-C5 and C5-C6 similar to prior exam. Remaining alignments normal. Skull base and vertebrae: Osseous demineralization. Multilevel disc space narrowing and minimal endplate spur formation. Multilevel facet degenerative changes. Skull base intact. No fracture, additional subluxation, or bone destruction. Soft tissues and spinal canal: Prevertebral soft tissues normal thickness. Mild atherosclerotic calcifications at the RIGHT carotid bifurcation. Remaining soft tissues unremarkable. Disc levels:  No specific abnormalities Upper chest: Emphysematous changes and scarring at lung apices. Other: N/A IMPRESSION: Atrophy with small vessel chronic ischemic changes of deep cerebral white matter. No acute intracranial abnormalities. Multilevel degenerative disc and facet disease changes of the cervical spine. No acute cervical spine abnormalities. Electronically Signed   By: Lavonia Dana M.D.   On: 08/09/2022 18:54     Medications:    sodium chloride 75 mL/hr at 08/11/22 1835   cefTRIAXone (ROCEPHIN)  IV Stopped (08/10/22 2128)    ALPRAZolam  2 mg Oral BID   aspirin EC  81 mg Oral Daily   heparin  5,000 Units Subcutaneous Q8H   rosuvastatin  10 mg Oral Daily   acetaminophen **OR** acetaminophen, albuterol, butorphanol, ondansetron **OR** ondansetron (ZOFRAN) IV  Assessment/ Plan:  [name] is a 76 y.o.  female  with past medical conditions including stroke,  GERD, diastolic heart failure, hypertension, right breast cancer with mastectomy, depression, and chronic kidney disease stage IIIa, who was admitted to [facility] on 08/09/2022 for AKI (acute kidney injury) ([place]) [N17.9]   Acute kidney injury on chronic kidney disease stage IIIa.  Baseline creatinine appears to be 1.15 with GFR 49 on 05/04/2022.  Acute kidney injury appears to be secondary to hypovolemia and poor oral intake combined with continued diuretic use.No IV contrast exposure.  Renal ultrasound shows bilateral renal cyst without hydronephrosis.  Olmesartan and diuretics held.    Renal function improving with IV fluids and nutrition.  Adequate urine output recorded.  Patient encouraged to improve and maintain oral nutrition.  Continue to avoid nephrotoxic agents and therapies.  Lab Results  Component Value Date   CREATININE 3.26 (H) 08/11/2022   CREATININE 5.98 (H) 08/10/2022   CREATININE 7.02 (H) 08/09/2022    Intake/Output Summary (Last 24 hours) at 08/11/2022 1842 Last data filed at 08/11/2022 0345 Gross per 24 hour  Intake --  Output 1000 ml  Net -1000 ml   2.  Acute metabolic acidosis, serum bicarb 21 on admission.  Corrected to 22 today.   3.Anemia of chronic kidney disease  Hemoglobin currently at goal.  4.  Hypertension with chronic kidney disease.  Home regimen includes olmesartan, hydrochlorothiazide, and torsemide.  All currently held due to kidney injury.  Blood pressure 129/67.    LOS: 1   8/30/20236:42 PM

## 2022-08-11 NOTE — ED Notes (Signed)
Informed RN bed asigned

## 2022-08-11 NOTE — Progress Notes (Signed)
PROGRESS NOTE   HPI was taken from Dr. Marcello Moores: Donna Evans is a 76 y.o. female with medical history significant of   hypertension, stroke, GERD,depression with anxiety, migraine headache, right breast cancer (s/p mastectomy), chronic pain syndrome, dCHF, CKD stage IIIa, history of recurrent falls in setting of djd of the spine and peripheral neuropathy,COPD who presents with complaint of increasing falls as well as symptoms of uti, stating her urine has foul odor. Patient states she has fall that was different from her other falls. She states she did not had dizziness, or chest pain , or n/v/d prior to fall. She was in her normal state of health. She states she was putting her dog out and all of a sudden felt her body go weak and she was not able to hold her weight. She states she did not feel presyncope or have any prodrome prior to fall. Currently she notes she still feels weak.  She also has complaint of foul odor to her urine. She notes no dysuria, no flank pain no blood in her urine no fevers or chills.      8/30 PT rec  HH    [name]  OJJ:[id number] DOB: [birth year] DOA: 08/09/2022 PCP: Jodi Marble, MD    Assessment & Plan:   Principal Problem:   AKI (acute kidney injury) ([place]) Active Problems:   Elevated troponin   Urinary tract infection   Generalized anxiety disorder   Hypertension, benign   Migraine headache   Chronic diastolic CHF (congestive heart failure) ([place])   Falls frequently   HLD (hyperlipidemia)   Elevated alkaline phosphatase level  Assessment and Plan: AKI on CKDIIIa: likely due to poor po intake and diuretics 8/30 renal ultrasound without any obstruction  On IV fluids  Creatinine improving  Nephrology following      UTI:  Urine culture with Klebsiella pneumonia  Sensitivity pending  Continue IV Rocephin      Recurrent falls:  PT OT recommend home health     Elevated troponins:  Minimally elevated  No chest pain  Psych due to  demand ischemia  Trend down    Elevated alkaline phosphatase: etiology unclear. AST, ALT & bilirubin all WNL.  Trending down      HTN: holding HTN meds    Chronic diastolic CHF: continue to hold home dose of torsemide. Monitor I/Os   Hx of CVA: continue on aspirin    COPD: w/o exacerbation. Continue on bronchodilators  HLD: continue on statin    Anxiety: severity unknown. Continue on xanax    Migraine headache: on butorphanol at home prn for headaches        DVT prophylaxis: heparin  Code Status: full Family Communication:  Disposition Plan: depends on PT/OT recs   Level of care: Progressive  Status is: Inpatient Remains inpatient appropriate because: severity of illness     Consultants:  Nephro   Procedures:   Antimicrobials: rocephin   Subjective: Feels weak. No sob or cp  Objective: Vitals:   08/10/22 2345 08/11/22 0336 08/11/22 0800 08/11/22 1200  BP: 115/64 117/60 129/67 (!) 104/53  Pulse: 72 70 79 70  Resp: 17 18 (!) 22 19  Temp: 98.6 F (37 C) 98.2 F (36.8 C) 98.3 F (36.8 C)   TempSrc: Oral Oral Oral   SpO2: 95% 96% 95% 96%  Weight:        Intake/Output Summary (Last 24 hours) at 08/11/2022 1507 Last data filed at 08/11/2022 0345 Gross per 24 hour  Intake --  Output 1000 ml  Net -1000 ml   Filed Weights   08/09/22 1732  Weight: 54.9 kg    Examination:  Calm, NAD Cta no w/r Reg s1/s2 no gallop Soft benign +bs No edema Awake and alert Mood and affect appropriate in current setting     Data Reviewed: I have personally reviewed following labs and imaging studies  CBC: Recent Labs  Lab 08/09/22 1742 08/10/22 0126 08/11/22 0338  WBC 8.9 8.0 4.3  HGB 12.8 11.9* 10.8*  HCT 39.8 36.9 32.7*  MCV 97.1 96.6 95.1  PLT 203 197 027   Basic Metabolic Panel: Recent Labs  Lab 08/09/22 1742 08/10/22 0126 08/11/22 0338  NA 137 137 140  K 4.2 3.9 3.7  CL 97* 102 109  CO2 21* 19* 22  GLUCOSE 109* 81 87  BUN 66* 66*  56*  CREATININE 7.02* 5.98* 3.26*  CALCIUM 9.4 8.7* 8.5*   GFR: Estimated Creatinine Clearance: 12.7 mL/min (A) (by C-G formula based on SCr of 3.26 mg/dL (H)). Liver Function Tests: Recent Labs  Lab 08/10/22 0126 08/11/22 0338  AST 31 36  ALT 16 16  ALKPHOS 276* 261*  BILITOT 0.6 0.4  PROT 6.9 6.0*  ALBUMIN 3.7 3.1*   No results for input(s): "LIPASE", "AMYLASE" in the last 168 hours. No results for input(s): "AMMONIA" in the last 168 hours. Coagulation Profile: No results for input(s): "INR", "PROTIME" in the last 168 hours. Cardiac Enzymes: Recent Labs  Lab 08/09/22 1742  CKTOTAL 376*   BNP (last 3 results) No results for input(s): "PROBNP" in the last 8760 hours. HbA1C: No results for input(s): "HGBA1C" in the last 72 hours. CBG: No results for input(s): "GLUCAP" in the last 168 hours. Lipid Profile: No results for input(s): "CHOL", "HDL", "LDLCALC", "TRIG", "CHOLHDL", "LDLDIRECT" in the last 72 hours. Thyroid Function Tests: No results for input(s): "TSH", "T4TOTAL", "FREET4", "T3FREE", "THYROIDAB" in the last 72 hours. Anemia Panel: No results for input(s): "VITAMINB12", "FOLATE", "FERRITIN", "TIBC", "IRON", "RETICCTPCT" in the last 72 hours. Sepsis Labs: No results for input(s): "PROCALCITON", "LATICACIDVEN" in the last 168 hours.  Recent Results (from the past 240 hour(s))  Urine Culture     Status: Abnormal (Preliminary result)   Collection Time: 08/09/22  5:39 PM   Specimen: Urine, Clean Catch  Result Value Ref Range Status   Specimen Description   Final    URINE, CLEAN CATCH Performed at [facility], [address] [place], [place] [address]    Special Requests   Final    NONE Performed at [facility], [place], [place] [address]    Culture (A)  Final    40,000 COLONIES/mL KLEBSIELLA PNEUMONIAE CULTURE REINCUBATED FOR BETTER GROWTH SUSCEPTIBILITIES TO FOLLOW Performed at [facility], [place] [address], [place], [place] [address]    Report Status PENDING  Incomplete         Radiology Studies: US RENAL  Result Date: 08/10/2022 CLINICAL DATA:  Acute renal injury EXAM: RENAL / URINARY TRACT ULTRASOUND COMPLETE COMPARISON:  None Available. FINDINGS: Right Kidney: Renal measurements: 8.0 x 4.3 x 4.2 cm. = volume: 76 mL. 9 mm cyst is noted in the lower pole. No follow-up is recommended. No mass lesion or hydronephrosis is noted. Left Kidney: Renal measurements: 8.7 x 4.3 x 3.3 cm. = volume: 66 mL. A few small cysts are noted within the left kidney measuring up to 1.2 cm. These are consistent with simple cysts. No follow-up is  recommended. Bladder: Appears normal for degree of bladder distention. Other: None. IMPRESSION: Bilateral simple renal cysts. No follow-up is recommended. No other focal abnormality is noted. Electronically Signed   By: Inez Catalina M.D.   On: 08/10/2022 03:20   DG Chest 2 View  Result Date: 08/09/2022 CLINICAL DATA:  Right rib pain after fall EXAM: CHEST - 2 VIEW COMPARISON:  04/28/2022 FINDINGS: No acute airspace disease. No pleural effusion or pneumothorax. Stable cardiomediastinal silhouette. Multiple old appearing bilateral rib fractures. Old left proximal humerus fracture. IMPRESSION: No active cardiopulmonary disease. Electronically Signed   By: Donavan Foil M.D.   On: 08/09/2022 21:24   CT HEAD WO CONTRAST (5MM)  Result Date: 08/09/2022 CLINICAL DATA:  Multiple falls, head and neck trauma, unable to give clear history, leg weakness. Past history breast cancer, TIA, COPD, GERD, stroke, lupus, hypertension EXAM: CT HEAD WITHOUT CONTRAST CT CERVICAL SPINE WITHOUT CONTRAST TECHNIQUE: Multidetector CT imaging of the head and cervical spine was performed following the standard protocol without intravenous contrast. Multiplanar CT image reconstructions of the cervical spine were also generated. RADIATION DOSE REDUCTION: This exam was performed according to the departmental  dose-optimization program which includes automated exposure control, adjustment of the mA and/or kV according to patient size and/or use of iterative reconstruction technique. COMPARISON:  CT head 04/28/2022, CT cervical spine 12/15/2021 FINDINGS: CT HEAD FINDINGS Brain: Generalized atrophy. Normal ventricular morphology. No midline shift or mass effect. Small vessel chronic ischemic changes of deep cerebral white matter. No intracranial hemorrhage, mass lesion, evidence of acute infarction, or extra-axial fluid collection. Vascular: Minimal atherosclerotic calcification of internal carotid arteries at skull base. No hyperdense vessels. Skull: Demineralized but intact Sinuses/Orbits: Small amount of mucus/fluid within RIGHT sphenoid sinus. Remaining paranasal sinuses, mastoid air cells, and middle ear cavities clear Other: N/A CT CERVICAL SPINE FINDINGS Alignment: Minimal retrolisthesis at C4-C5 and C5-C6 similar to prior exam. Remaining alignments normal. Skull base and vertebrae: Osseous demineralization. Multilevel disc space narrowing and minimal endplate spur formation. Multilevel facet degenerative changes. Skull base intact. No fracture, additional subluxation, or bone destruction. Soft tissues and spinal canal: Prevertebral soft tissues normal thickness. Mild atherosclerotic calcifications at the RIGHT carotid bifurcation. Remaining soft tissues unremarkable. Disc levels:  No specific abnormalities Upper chest: Emphysematous changes and scarring at lung apices. Other: N/A IMPRESSION: Atrophy with small vessel chronic ischemic changes of deep cerebral white matter. No acute intracranial abnormalities. Multilevel degenerative disc and facet disease changes of the cervical spine. No acute cervical spine abnormalities. Electronically Signed   By: Lavonia Dana M.D.   On: 08/09/2022 18:54   CT Cervical Spine Wo Contrast  Result Date: 08/09/2022 CLINICAL DATA:  Multiple falls, head and neck trauma, unable to  give clear history, leg weakness. Past history breast cancer, TIA, COPD, GERD, stroke, lupus, hypertension EXAM: CT HEAD WITHOUT CONTRAST CT CERVICAL SPINE WITHOUT CONTRAST TECHNIQUE: Multidetector CT imaging of the head and cervical spine was performed following the standard protocol without intravenous contrast. Multiplanar CT image reconstructions of the cervical spine were also generated. RADIATION DOSE REDUCTION: This exam was performed according to the departmental dose-optimization program which includes automated exposure control, adjustment of the mA and/or kV according to patient size and/or use of iterative reconstruction technique. COMPARISON:  CT head 04/28/2022, CT cervical spine 12/15/2021 FINDINGS: CT HEAD FINDINGS Brain: Generalized atrophy. Normal ventricular morphology. No midline shift or mass effect. Small vessel chronic ischemic changes of deep cerebral white matter. No intracranial hemorrhage, mass lesion, evidence of acute infarction, or extra-axial  fluid collection. Vascular: Minimal atherosclerotic calcification of internal carotid arteries at skull base. No hyperdense vessels. Skull: Demineralized but intact Sinuses/Orbits: Small amount of mucus/fluid within RIGHT sphenoid sinus. Remaining paranasal sinuses, mastoid air cells, and middle ear cavities clear Other: N/A CT CERVICAL SPINE FINDINGS Alignment: Minimal retrolisthesis at C4-C5 and C5-C6 similar to prior exam. Remaining alignments normal. Skull base and vertebrae: Osseous demineralization. Multilevel disc space narrowing and minimal endplate spur formation. Multilevel facet degenerative changes. Skull base intact. No fracture, additional subluxation, or bone destruction. Soft tissues and spinal canal: Prevertebral soft tissues normal thickness. Mild atherosclerotic calcifications at the RIGHT carotid bifurcation. Remaining soft tissues unremarkable. Disc levels:  No specific abnormalities Upper chest: Emphysematous changes and  scarring at lung apices. Other: N/A IMPRESSION: Atrophy with small vessel chronic ischemic changes of deep cerebral white matter. No acute intracranial abnormalities. Multilevel degenerative disc and facet disease changes of the cervical spine. No acute cervical spine abnormalities. Electronically Signed   By: Lavonia Dana M.D.   On: 08/09/2022 18:54   DG Lumbar Spine Complete  Result Date: 08/09/2022 CLINICAL DATA:  Trauma, fall EXAM: LUMBAR SPINE - COMPLETE 4+ VIEW COMPARISON:  None Available. FINDINGS: No recent fracture is seen. Alignment of posterior margins of vertebral bodies is unremarkable. Degenerative changes are noted in facet joints, more so at L5-S1 level. There are scattered arterial calcifications. Surgical clips are noted in right upper quadrant. IMPRESSION: No recent fracture is seen in the lumbar spine. Degenerative changes are noted, more so at L5-S1 level. Electronically Signed   By: Elmer Picker M.D.   On: 08/09/2022 18:45        Scheduled Meds:  ALPRAZolam  2 mg Oral BID   aspirin EC  81 mg Oral Daily   heparin  5,000 Units Subcutaneous Q8H   rosuvastatin  10 mg Oral Daily   Continuous Infusions:  cefTRIAXone (ROCEPHIN)  IV Stopped (08/10/22 2128)     LOS: 1 day    Time spent: 35 mins     Nolberto Hanlon, MD [facility] Pager [phone]  If 7PM-7AM, please contact night-coverage [email or web address] 08/11/2022, 3:07 PM

## 2022-08-12 DIAGNOSIS — N179 Acute kidney failure, unspecified: Secondary | ICD-10-CM | POA: Diagnosis not present

## 2022-08-12 LAB — BASIC METABOLIC PANEL
Anion gap: 5 (ref 5–15)
BUN: 45 mg/dL — ABNORMAL HIGH (ref 8–23)
CO2: 22 mmol/L (ref 22–32)
Calcium: 8 mg/dL — ABNORMAL LOW (ref 8.9–10.3)
Chloride: 114 mmol/L — ABNORMAL HIGH (ref 98–111)
Creatinine, Ser: 2.06 mg/dL — ABNORMAL HIGH (ref 0.44–1.00)
GFR, Estimated: 25 mL/min — ABNORMAL LOW (ref >60)
Glucose, Bld: 91 mg/dL (ref 70–99)
Potassium: 4 mmol/L (ref 3.5–5.1)
Sodium: 141 mmol/L (ref 135–145)

## 2022-08-12 NOTE — Progress Notes (Signed)
Physical Therapy Treatment Patient Details Name: Donna Evans MRN: [id number] DOB: Feb 07, 1946 Today's Date: 08/12/2022   History of Present Illness Pt is a 76 yo female that was admitted for AKI and frequent falls. PMH of CVA, GERD, diastolic heart failure, HTN, R breast cancer, CKDIII.    PT Comments    Patient alert, agreeable to PT but did reference she felt sleepy. She was able to stand pivot to [facility] with CGA, PT stabilized BSC for safety. She was able to perform her pericare in standing and doff/don her briefs, CGA. RW provided for ambulation (~119f), CGA. The patient did demonstrate some "shuffled steps" today due to mild LOB but able to correct without physical assistance. Returned to bed to attempt to take a nap. The patient would benefit from further skilled PT intervention to continue to progress towards goals. Recommendation remains appropriate.         Recommendations for follow up therapy are one component of a multi-disciplinary discharge planning process, led by the attending physician.  Recommendations may be updated based on patient status, additional functional criteria and insurance authorization.  Follow Up Recommendations  Home health PT     Assistance Recommended at Discharge Intermittent Supervision/Assistance  Patient can return home with the following Assistance with cooking/housework;Assist for transportation;Help with stairs or ramp for entrance;Direct supervision/assist for medications management   Equipment Recommendations  None recommended by PT    Recommendations for Other Services       Precautions / Restrictions Precautions Precautions: Fall Restrictions Weight Bearing Restrictions: No     Mobility  Bed Mobility Overal bed mobility: Needs Assistance Bed Mobility: Supine to Sit, Sit to Supine     Supine to sit: Supervision, HOB elevated Sit to supine: Supervision        Transfers Overall transfer level: Needs assistance Equipment  used: Rolling walker (2 wheels) Transfers: Sit to/from Stand Sit to Stand: Supervision, Min guard           General transfer comment: [facility], [facility] and [facility]   Ambulation/Gait Ambulation/Gait assistance: Min guard Gait Distance (Feet): 100 Feet Assistive device: Rolling walker (2 wheels)         General Gait Details: Pt did have 1-2 instances of "shuffled steps" today to address small LOB but able to correct without intervention from PT.   Stairs             Wheelchair Mobility    Modified Rankin (Stroke Patients Only)       Balance Overall balance assessment: Needs assistance Sitting-balance support: Feet supported, Single extremity supported Sitting balance-Leahy Scale: Good     Standing balance support: Reliant on assistive device for balance Standing balance-Leahy Scale: Fair Standing balance comment: pericare in standing  CGA and able to doff/don underwear                            Cognition Arousal/Alertness: Awake/alert Behavior During Therapy: WFL for tasks assessed/performed Overall Cognitive Status: Within Functional Limits for tasks assessed                                          Exercises      General Comments        Pertinent Vitals/Pain Pain Assessment Pain Assessment: No/denies pain    Home Living  Prior Function            PT Goals (current goals can now be found in the care plan section) Progress towards PT goals: Progressing toward goals    Frequency    Min 2X/week      PT Plan Current plan remains appropriate    Co-evaluation              AM-PAC PT "6 Clicks" Mobility   Outcome Measure  Help needed turning from your back to your side while in a flat bed without using bedrails?: None Help needed moving from lying on your back to sitting on the side of a flat bed without using bedrails?:  None Help needed moving to and from a bed to a chair (including a wheelchair)?: None Help needed standing up from a chair using your arms (e.g., wheelchair or bedside chair)?: None Help needed to walk in hospital room?: None Help needed climbing 3-5 steps with a railing? : A Little 6 Click Score: 23    End of Session Equipment Utilized During Treatment: Gait belt Activity Tolerance: Patient tolerated treatment well Patient left: in bed;with call bell/phone within reach;with bed alarm set Nurse Communication: Mobility status PT Visit Diagnosis: Other abnormalities of gait and mobility (R26.89);Difficulty in walking, not elsewhere classified (R26.2);Muscle weakness (generalized) (M62.81);History of falling (Z91.81)     Time: 5015-8682 PT Time Calculation (min) (ACUTE ONLY): 19 min  Charges:  $Therapeutic Activity: 8-22 mins                     Lieutenant Diego PT, DPT 3:50 PM,08/12/22

## 2022-08-12 NOTE — Progress Notes (Signed)
[facility]  ROUNDING NOTE   Subjective:   Patient seen sitting up in bed, alert and oriented Partially completed breakfast tray at bedside Continues to endorse poor appetite Remains weak No lower extremity edema  Creatinine 2.06  Objective:  Vital signs in last 24 hours:  Temp:  [97.9 F (36.6 C)-98.8 F (37.1 C)] 97.9 F (36.6 C) (08/31 1135) Pulse Rate:  [66-76] 69 (08/31 1135) Resp:  [16-21] 18 (08/31 1135) BP: (97-126)/(40-75) 123/70 (08/31 1135) SpO2:  [96 %-100 %] 98 % (08/31 1135) Weight:  [64.1 kg] 64.1 kg (08/31 0639)  Weight change:  Filed Weights   08/09/22 1732 08/12/22 0501 08/12/22 0639  Weight: 54.9 kg 64.1 kg 64.1 kg    Intake/Output: I/O last 3 completed shifts: In: 951.7 [I.V.:751.7; IV Piggyback:200] Out: 1250 [Urine:1250]   Intake/Output this shift:  No intake/output data recorded.  Physical Exam: General: NAD  Head: Normocephalic, atraumatic. Moist oral mucosal membranes  Eyes: Anicteric  Lungs:  Clear to auscultation, normal effort, room air  Heart: Regular rate and rhythm  Abdomen:  Soft, nontender, nondistended  Extremities: No peripheral edema.  Neurologic: Nonfocal, moving all four extremities  Skin: No lesions  Access: None    Basic Metabolic Panel: Recent Labs  Lab 08/09/22 1742 08/10/22 0126 08/11/22 0338 08/12/22 0439  NA 137 137 140 141  K 4.2 3.9 3.7 4.0  CL 97* 102 109 114*  CO2 21* 19* 22 22  GLUCOSE 109* 81 87 91  BUN 66* 66* 56* 45*  CREATININE 7.02* 5.98* 3.26* 2.06*  CALCIUM 9.4 8.7* 8.5* 8.0*     Liver Function Tests: Recent Labs  Lab 08/10/22 0126 08/11/22 0338  AST 31 36  ALT 16 16  ALKPHOS 276* 261*  BILITOT 0.6 0.4  PROT 6.9 6.0*  ALBUMIN 3.7 3.1*    No results for input(s): "LIPASE", "AMYLASE" in the last 168 hours. No results for input(s): "AMMONIA" in the last 168 hours.  CBC: Recent Labs  Lab 08/09/22 1742 08/10/22 0126 08/11/22 0338  WBC 8.9 8.0 4.3  HGB 12.8  11.9* 10.8*  HCT 39.8 36.9 32.7*  MCV 97.1 96.6 95.1  PLT 203 197 174     Cardiac Enzymes: Recent Labs  Lab 08/09/22 1742  CKTOTAL 376*     BNP: Invalid input(s): "POCBNP"  CBG: No results for input(s): "GLUCAP" in the last 168 hours.  Microbiology: Results for orders placed or performed during the hospital encounter of 08/09/22  Urine Culture     Status: Abnormal (Preliminary result)   Collection Time: 08/09/22  5:39 PM   Specimen: Urine, Clean Catch  Result Value Ref Range Status   Specimen Description   Final    URINE, CLEAN CATCH Performed at [facility], [address], [place], [place] [address]    Special Requests   Final    NONE Performed at [facility], [place], [place] [address]    Culture (A)  Final    40,000 COLONIES/mL KLEBSIELLA PNEUMONIAE 20,000 COLONIES/mL ENTEROCOCCUS AVIUM SUSCEPTIBILITIES TO FOLLOW Performed at [facility], [place] [address] [place], [place] [address]    Report Status PENDING  Incomplete   Organism ID, Bacteria KLEBSIELLA PNEUMONIAE (A)  Final      Susceptibility   Klebsiella pneumoniae - MIC*    AMPICILLIN >=32 RESISTANT Resistant     CEFAZOLIN <=4 SENSITIVE Sensitive     CEFEPIME <=0.12 SENSITIVE Sensitive     CEFTRIAXONE <=0.25 SENSITIVE Sensitive  CIPROFLOXACIN <=0.25 SENSITIVE Sensitive     GENTAMICIN <=1 SENSITIVE Sensitive     IMIPENEM <=0.25 SENSITIVE Sensitive     NITROFURANTOIN 64 INTERMEDIATE Intermediate     TRIMETH/SULFA <=20 SENSITIVE Sensitive     AMPICILLIN/SULBACTAM 4 SENSITIVE Sensitive     PIP/TAZO <=4 SENSITIVE Sensitive     * 40,000 COLONIES/mL KLEBSIELLA PNEUMONIAE    Coagulation Studies: No results for input(s): "LABPROT", "INR" in the last 72 hours.  Urinalysis: Recent Labs    08/09/22 1739  COLORURINE YELLOW*  LABSPEC 1.010  PHURINE 5.0  GLUCOSEU NEGATIVE  HGBUR MODERATE*  BILIRUBINUR NEGATIVE  KETONESUR NEGATIVE  PROTEINUR NEGATIVE   NITRITE NEGATIVE  LEUKOCYTESUR LARGE*       Imaging: No results found.   Medications:    sodium chloride 75 mL/hr at 08/12/22 0840   cefTRIAXone (ROCEPHIN)  IV 200 mL/hr at 08/12/22 4097    ALPRAZolam  2 mg Oral BID   aspirin EC  81 mg Oral Daily   heparin  5,000 Units Subcutaneous Q8H   lidocaine  1 patch Transdermal Q24H   rosuvastatin  10 mg Oral Daily   acetaminophen **OR** acetaminophen, albuterol, butorphanol, ondansetron **OR** ondansetron (ZOFRAN) IV  Assessment/ Plan:  [name] is a 76 y.o.  female  with past medical conditions including stroke, GERD, diastolic heart failure, hypertension, right breast cancer with mastectomy, depression, and chronic kidney disease stage IIIa, who was admitted to [facility] on 08/09/2022 for AKI (acute kidney injury) ([place]) [N17.9] Fall, initial encounter [W19.XXXA] Hypotension, unspecified hypotension type [I95.9] Urinary tract infection without hematuria, site unspecified [N39.0]   Acute kidney injury on chronic kidney disease stage IIIa.  Baseline creatinine appears to be 1.15 with GFR 49 on 05/04/2022.  Acute kidney injury appears to be secondary to hypovolemia and poor oral intake combined with continued diuretic use.No IV contrast exposure.  Renal ultrasound shows bilateral renal cyst without hydronephrosis.  Olmesartan and diuretics held.    Creatinine continues to improve but appetite remains poor.  We will continue IV fluids until appetite increases.  May consider appetite stimulant however patient hesitant due to IBS.  Patient will need to follow-up in our office at discharge for close monitoring.  Lab Results  Component Value Date   CREATININE 2.06 (H) 08/12/2022   CREATININE 3.26 (H) 08/11/2022   CREATININE 5.98 (H) 08/10/2022    Intake/Output Summary (Last 24 hours) at 08/12/2022 1323 Last data filed at 08/12/2022 0508 Gross per 24 hour  Intake 951.66 ml  Output 250 ml  Net 701.66 ml    2.  Acute metabolic  acidosis, corrected   3.Anemia of chronic kidney disease  Hemoglobin within desired target  4.  Hypertension with chronic kidney disease.  Home regimen includes olmesartan, hydrochlorothiazide, and torsemide.  All currently held due to kidney injury.  Blood pressure stable for this patient    LOS: 2 [place] 8/31/20231:23 PM

## 2022-08-12 NOTE — TOC Initial Note (Signed)
Transition of Care [facility]) - Initial/Assessment Note    Patient Details  Name: Donna Evans MRN: [id number] Date of Birth: [birth year]  Transition of Care [facility]) CM/SW Contact:    Alberteen Sam, LCSW Phone Number: 08/12/2022, 10:43 AM  Clinical Narrative:                  CSW spoke with patient regarding home health recommendations reports being active with [facility] and would like to resume services at discharge to include home health PT OT and RN. CSW has reached out to [place] with [facility] to inform. She reports she would be interested in exploring additional assistance at home and is agreeable for referral to be made for [facility], CSW has reached out to [place] with [facility] for them to follow up.   No further needs identified at this time.   Expected Discharge Plan: [place] Barriers to Discharge: Continued Medical Work up   Patient Goals and CMS Choice Patient states their goals for this hospitalization and ongoing recovery are:: to go home CMS Medicare.gov Compare Post Acute Care list provided to:: Patient Choice offered to / list presented to : Patient  Expected Discharge Plan and Services Expected Discharge Plan: [place]       Living arrangements for the past 2 months: Single Family Home                           HH Arranged: PT, OT, RN [facility] Agency: [place] Date [place]: 08/12/22 Time [place]: 29 Representative spoke with at [place]: [place]  Prior Living Arrangements/Services Living arrangements for the past 2 months: [place] with:: Self   Do you feel safe going back to the place where you live?: Yes               Activities of Daily Living Home Assistive Devices/Equipment: Eyeglasses, Cane (specify quad or straight), Walker (specify type) ADL Screening (condition at time of admission) Patient's cognitive ability adequate to safely complete daily  activities?: Yes Is the patient deaf or have difficulty hearing?: No Does the patient have difficulty seeing, even when wearing glasses/contacts?: No Does the patient have difficulty concentrating, remembering, or making decisions?: No Patient able to express need for assistance with ADLs?: Yes Does the patient have difficulty dressing or bathing?: No Independently performs ADLs?: No Communication: Independent Dressing (OT): Independent Grooming: Independent Feeding: Independent Bathing: Needs assistance Is this a change from baseline?: Change from baseline, expected to last <3 days Toileting: Needs assistance Is this a change from baseline?: Change from baseline, expected to last <3 days In/Out Bed: Needs assistance Is this a change from baseline?: Change from baseline, expected to last <3 days Walks in Home: Independent with device (comment) Does the patient have difficulty walking or climbing stairs?: Yes Weakness of Legs: Both Weakness of Arms/Hands: None  Permission Sought/Granted                  Emotional Assessment   Attitude/Demeanor/Rapport: Gracious Affect (typically observed): Calm Orientation: : Oriented to Self, Oriented to Place, Oriented to  Time, Oriented to Situation Alcohol / Substance Use: Not Applicable Psych Involvement: No (comment)  Admission diagnosis:  AKI (acute kidney injury) ([place]) [N17.9] Fall, initial encounter [W19.XXXA] Hypotension, unspecified hypotension type [I95.9] Urinary tract infection without hematuria, site unspecified [N39.0] Patient Active Problem List   Diagnosis Date Noted  HLD (hyperlipidemia) 08/10/2022   Elevated alkaline phosphatase level 08/10/2022   Elevated troponin 04/28/2022   Stroke ([place]) 04/28/2022   Fall 04/28/2022   Acute renal failure superimposed on stage 3a chronic kidney disease ([place]) 04/28/2022   Chronic diastolic CHF (congestive heart failure) ([place]) 16/60/6004   Acute metabolic encephalopathy  59/97/7414   Venous insufficiency 03/31/2022   SOB (shortness of breath) on exertion 03/08/2022   Pedal edema 03/08/2022   Closed fracture of distal end of left radius 12/22/2021   Low back pain 12/17/2021   Fracture of pelvis ([place]) 11/13/2021   Bilateral leg weakness 05/13/2021   Memory change 03/04/2021   Imbalance 03/04/2021   Falls frequently 03/04/2021   Dizziness 03/04/2021   Senile osteoporosis 10/18/2019   Long term current use of opiate analgesic 07/04/2017   Long term prescription opiate use 07/04/2017   Opiate use 07/04/2017   Bursitis of shoulder 07/04/2017   Closed fracture of upper end of humerus 07/04/2017   Knee pain 07/04/2017   Localized, primary osteoarthritis 07/04/2017   Osteoarthritis of knee 07/04/2017   Pain in limb 07/04/2017   Sprain of wrist 07/04/2017   Pain in joint involving ankle and foot 07/04/2017   Impingement syndrome of left shoulder region 06/29/2017   Trochanteric bursitis 06/29/2017   AKI (acute kidney injury) ([place]) 10/01/2016   Syncope 09/30/2016   Migraine headache 08/10/2016   Controlled substance agreement signed 06/12/2016   Insomnia 06/12/2016   Hypertension, benign 02/04/2016   Easy bruisability 11/13/2015   Osteoporosis 11/13/2015   Fibromyalgia syndrome 11/13/2015   Generalized anxiety disorder 11/13/2015   Chronic pain syndrome 11/13/2015   Overactive detrusor 09/11/2015   Adrenal adenoma 09/02/2015   Chronic cystitis 09/02/2015   Incomplete bladder emptying 23/95/3202   Renal colic 33/43/5686   Urinary tract infection 07/28/2015   Gross hematuria 07/28/2015   History of reconstruction of both breasts 06/18/2015   S/P cardiac catheterization 06/18/2015   Acquired absence of both breasts and nipples 11/20/2014   Transient cerebral ischemia 11/28/2013   Hemiplegia of dominant side ([place]) 11/20/2013   PCP:  Jodi Marble, MD Pharmacy:   [place], [place] [address] Phone: [phone] Fax: [phone]  [id number] [place] [place] [address] [place] [address] Phone: [phone] Fax: [phone]  [id number] [place]: [phone]: [phone]    Social Determinants of Health (SDOH) Interventions    Readmission Risk Interventions     No data to display

## 2022-08-12 NOTE — Progress Notes (Signed)
Occupational Therapy Treatment Patient Details Name: Donna Evans MRN: [id number] DOB: 1946/03/25 Today's Date: 08/12/2022   History of present illness Pt is a 76 yo female that was admitted for AKI and frequent falls. PMH of CVA, GERD, diastolic heart failure, HTN, R breast cancer, CKDIII.   OT comments  Ms. Seeman made good progress today, with pain reduced today compared to yesterday and with generally improved balance. She was able to perform bed mobility w/o physical assistance, could stand and transfer to [facility] w/ SUPV. During in-room ambulation w/ RW, however, she did have one significant LOB and required assist from therapist to remain standing. Pt can perform pericare with Mod I after urination, requires Max A for pericare following BM, 2/2 lower back pain. Continue to recommend [place] with intermittent SUPV.   Recommendations for follow up therapy are one component of a multi-disciplinary discharge planning process, led by the attending physician.  Recommendations may be updated based on patient status, additional functional criteria and insurance authorization.    Follow Up Recommendations  Home health OT    Assistance Recommended at Discharge Intermittent Supervision/Assistance  Patient can return home with the following  A little help with walking and/or transfers;A little help with bathing/dressing/bathroom;Assistance with cooking/housework;Assist for transportation   Equipment Recommendations  None recommended by OT    Recommendations for Other Services      Precautions / Restrictions Precautions Precautions: Fall Restrictions Weight Bearing Restrictions: No       Mobility Bed Mobility Overal bed mobility: Needs Assistance Bed Mobility: Supine to Sit, Sit to Supine     Supine to sit: Supervision, HOB elevated Sit to supine: Supervision   General bed mobility comments: extra time, effort    Transfers Overall transfer level: Needs assistance Equipment used:  Rolling walker (2 wheels) Transfers: Sit to/from Stand, Bed to chair/wheelchair/BSC Sit to Stand: Supervision, Min guard     Step pivot transfers: Supervision, Min guard           Balance Overall balance assessment: Needs assistance Sitting-balance support: Feet supported, Single extremity supported Sitting balance-Leahy Scale: Good     Standing balance support: Reliant on assistive device for balance, Bilateral upper extremity supported, During functional activity Standing balance-Leahy Scale: Fair Standing balance comment: one significant LOB during ambulation                           ADL either performed or assessed with clinical judgement   ADL Overall ADL's : Needs assistance/impaired                 Upper Body Dressing : Supervision/safety;Set up   Lower Body Dressing: Sitting/lateral leans;Minimal assistance   Toilet Transfer: Minimal assistance   Toileting- Clothing Manipulation and Hygiene: Maximal assistance;Sit to/from stand Toileting - Clothing Manipulation Details (indicate cue type and reason): Max A for pericare post BM       General ADL Comments: close SUPV for safety for OOB fxl mobility    Extremity/Trunk Assessment Upper Extremity Assessment Upper Extremity Assessment: Generalized weakness   Lower Extremity Assessment Lower Extremity Assessment: Generalized weakness   Cervical / Trunk Assessment Cervical / Trunk Assessment: Normal    Vision       Perception     Praxis      Cognition Arousal/Alertness: Awake/alert Behavior During Therapy: WFL for tasks assessed/performed Overall Cognitive Status: Within Functional Limits for tasks assessed  Exercises Other Exercises Other Exercises: Educ re: falls prevention, home modifications, DC recs    Shoulder Instructions       General Comments      Pertinent Vitals/ Pain       Pain Assessment Pain Assessment:  0-10 Pain Score: 5  Pain Location: lower back, more significant on R Pain Descriptors / Indicators: Discomfort, Grimacing, Guarding, Aching Pain Intervention(s): Repositioned, Limited activity within patient's tolerance  Home Living                                          Prior Functioning/Environment              Frequency  Min 2X/week        Progress Toward Goals  OT Goals(current goals can now be found in the care plan section)  Progress towards OT goals: Progressing toward goals  Acute Rehab OT Goals OT Goal Formulation: With patient Time For Goal Achievement: 08/25/22 Potential to Achieve Goals: Good  Plan Discharge plan remains appropriate;Frequency remains appropriate    Co-evaluation                 AM-PAC OT "6 Clicks" Daily Activity     Outcome Measure   Help from another person eating meals?: None Help from another person taking care of personal grooming?: None Help from another person toileting, which includes using toliet, bedpan, or urinal?: A Little Help from another person bathing (including washing, rinsing, drying)?: A Little Help from another person to put on and taking off regular upper body clothing?: A Little Help from another person to put on and taking off regular lower body clothing?: A Little 6 Click Score: 20    End of Session Equipment Utilized During Treatment: Rolling walker (2 wheels)  OT Visit Diagnosis: Repeated falls (R29.6);Muscle weakness (generalized) (M62.81)   Activity Tolerance Patient tolerated treatment well   Patient Left in bed;with call bell/phone within reach;with bed alarm set;with family/visitor present;with nursing/sitter in room   Nurse Communication Mobility status        Time: 6314-9702 OT Time Calculation (min): 37 min  Charges: OT General Charges $OT Visit: 1 Visit OT Treatments $Self Care/Home Management : 23-37 mins Josiah Lobo, PhD, MS, OTR/L 08/12/22, 5:00  PM

## 2022-08-12 NOTE — Progress Notes (Signed)
PROGRESS NOTE   HPI was taken from Dr. Marcello Moores: Donna Evans is a 76 y.o. female with medical history significant of   hypertension, stroke, GERD,depression with anxiety, migraine headache, right breast cancer (s/p mastectomy), chronic pain syndrome, dCHF, CKD stage IIIa, history of recurrent falls in setting of djd of the spine and peripheral neuropathy,COPD who presents with complaint of increasing falls as well as symptoms of uti, stating her urine has foul odor. Patient states she has fall that was different from her other falls. She states she did not had dizziness, or chest pain , or n/v/d prior to fall. She was in her normal state of health. She states she was putting her dog out and all of a sudden felt her body go weak and she was not able to hold her weight. She states she did not feel presyncope or have any prodrome prior to fall. Currently she notes she still feels weak.  She also has complaint of foul odor to her urine. She notes no dysuria, no flank pain no blood in her urine no fevers or chills.      8/30 PT rec  [facility] 8/31 no new complaints    TOI STELLY  WNI:[id number] DOB: [birth year] DOA: 08/09/2022 PCP: Jodi Marble, MD    Assessment & Plan:   Principal Problem:   AKI (acute kidney injury) ([place]) Active Problems:   Elevated troponin   Urinary tract infection   Generalized anxiety disorder   Hypertension, benign   Migraine headache   Chronic diastolic CHF (congestive heart failure) ([place])   Falls frequently   HLD (hyperlipidemia)   Elevated alkaline phosphatase level  Assessment and Plan: AKI on CKDIIIa: likely due to poor po intake and diuretics 8/30 renal ultrasound without any obstruction  8/31 creatinine improving Nephrology following The IV fluids Encourage p.o. intake Needs to follow-up with nephrology as outpatient       UTI:  Urine culture with Klebsiella pneumonia  8/31 continue IV Rocephin     Recurrent falls:  PT OT recommends  home health     Elevated troponins:  Minimally elevated  Psych due to demand ischemia  8/31 trended down, no chest pain    Elevated alkaline phosphatase: etiology unclear. AST, ALT & bilirubin all WNL.  Trending down      HTN: holding HTN meds    Chronic diastolic CHF: continue to hold home dose of torsemide. Monitor I/Os   Hx of CVA: continue on aspirin    COPD: w/o exacerbation. Continue on bronchodilators  HLD: continue on statin    Anxiety: severity unknown. Continue on xanax    Migraine headache: on butorphanol at home prn for headaches        DVT prophylaxis: heparin  Code Status: full Family Communication:  Disposition Plan: depends on PT/OT recs   Level of care: Progressive  Status is: Inpatient Remains inpatient appropriate because: severity of illness     Consultants:  Nephro   Procedures:   Antimicrobials: rocephin   Subjective: No shortness of breath or chest pain  Objective: Vitals:   08/12/22 0405 08/12/22 0501 08/12/22 0639 08/12/22 0746  BP: 123/69  123/69 126/75  Pulse: 70  70 66  Resp: '19  19 19  '$ Temp: 98.3 F (36.8 C)  98.3 F (36.8 C) 98.2 F (36.8 C)  TempSrc: Oral  Oral Oral  SpO2: 98%   99%  Weight:  64.1 kg 64.1 kg   Height:   '5\' 3"'$  (1.6 m)  Intake/Output Summary (Last 24 hours) at 08/12/2022 0922 Last data filed at 08/12/2022 3354 Gross per 24 hour  Intake 951.66 ml  Output 250 ml  Net 701.66 ml   Filed Weights   08/09/22 1732 08/12/22 0501 08/12/22 0639  Weight: 54.9 kg 64.1 kg 64.1 kg    Examination: Calm, NAD Cta no w/r Reg s1/s2 no gallop Soft benign +bs No edema Aaoxox3  Mood and affect appropriate in current setting     Data Reviewed: I have personally reviewed following labs and imaging studies  CBC: Recent Labs  Lab 08/09/22 1742 08/10/22 0126 08/11/22 0338  WBC 8.9 8.0 4.3  HGB 12.8 11.9* 10.8*  HCT 39.8 36.9 32.7*  MCV 97.1 96.6 95.1  PLT 203 197 562   Basic Metabolic  Panel: Recent Labs  Lab 08/09/22 1742 08/10/22 0126 08/11/22 0338 08/12/22 0439  NA 137 137 140 141  K 4.2 3.9 3.7 4.0  CL 97* 102 109 114*  CO2 21* 19* 22 22  GLUCOSE 109* 81 87 91  BUN 66* 66* 56* 45*  CREATININE 7.02* 5.98* 3.26* 2.06*  CALCIUM 9.4 8.7* 8.5* 8.0*   GFR: Estimated Creatinine Clearance: 20.9 mL/min (A) (by C-G formula based on SCr of 2.06 mg/dL (H)). Liver Function Tests: Recent Labs  Lab 08/10/22 0126 08/11/22 0338  AST 31 36  ALT 16 16  ALKPHOS 276* 261*  BILITOT 0.6 0.4  PROT 6.9 6.0*  ALBUMIN 3.7 3.1*   No results for input(s): "LIPASE", "AMYLASE" in the last 168 hours. No results for input(s): "AMMONIA" in the last 168 hours. Coagulation Profile: No results for input(s): "INR", "PROTIME" in the last 168 hours. Cardiac Enzymes: Recent Labs  Lab 08/09/22 1742  CKTOTAL 376*   BNP (last 3 results) No results for input(s): "PROBNP" in the last 8760 hours. HbA1C: No results for input(s): "HGBA1C" in the last 72 hours. CBG: No results for input(s): "GLUCAP" in the last 168 hours. Lipid Profile: No results for input(s): "CHOL", "HDL", "LDLCALC", "TRIG", "CHOLHDL", "LDLDIRECT" in the last 72 hours. Thyroid Function Tests: No results for input(s): "TSH", "T4TOTAL", "FREET4", "T3FREE", "THYROIDAB" in the last 72 hours. Anemia Panel: No results for input(s): "VITAMINB12", "FOLATE", "FERRITIN", "TIBC", "IRON", "RETICCTPCT" in the last 72 hours. Sepsis Labs: No results for input(s): "PROCALCITON", "LATICACIDVEN" in the last 168 hours.  Recent Results (from the past 240 hour(s))  Urine Culture     Status: Abnormal (Preliminary result)   Collection Time: 08/09/22  5:39 PM   Specimen: Urine, Clean Catch  Result Value Ref Range Status   Specimen Description URINE, CLEAN CATCH  Final   Special Requests NONE  Final   Culture (A)  Final    40,000 COLONIES/mL KLEBSIELLA PNEUMONIAE CULTURE REINCUBATED FOR BETTER GROWTH SUSCEPTIBILITIES TO FOLLOW     Report Status PENDING  Incomplete   Organism ID, Bacteria KLEBSIELLA PNEUMONIAE (A)  Final      Susceptibility   Klebsiella pneumoniae - MIC*    AMPICILLIN >=32 RESISTANT Resistant     CEFAZOLIN <=4 SENSITIVE Sensitive     CEFEPIME <=0.12 SENSITIVE Sensitive     CEFTRIAXONE <=0.25 SENSITIVE Sensitive     CIPROFLOXACIN <=0.25 SENSITIVE Sensitive     GENTAMICIN <=1 SENSITIVE Sensitive     IMIPENEM <=0.25 SENSITIVE Sensitive     NITROFURANTOIN 64 INTERMEDIATE Intermediate     TRIMETH/SULFA <=20 SENSITIVE Sensitive     AMPICILLIN/SULBACTAM 4 SENSITIVE Sensitive     PIP/TAZO Value in next row Sensitive      <=4  SENSITIVEPerformed at [facility], [place] [address] [place], [place] [address]    * 40,000 COLONIES/mL KLEBSIELLA PNEUMONIAE         Radiology Studies: No results found.      Scheduled Meds:  ALPRAZolam  2 mg Oral BID   aspirin EC  81 mg Oral Daily   heparin  5,000 Units Subcutaneous Q8H   lidocaine  1 patch Transdermal Q24H   rosuvastatin  10 mg Oral Daily   Continuous Infusions:  sodium chloride 75 mL/hr at 08/12/22 0840   cefTRIAXone (ROCEPHIN)  IV 200 mL/hr at 08/12/22 0508     LOS: 2 days    Time spent: 35 mins     Nolberto Hanlon, MD [facility] Pager [phone]  If 7PM-7AM, please contact night-coverage [email or web address] 08/12/2022, 9:22 AM

## 2022-08-12 NOTE — Progress Notes (Signed)
Chaplain responded to page for pt visit. Pt shared she was looking for some more information on AD paperwork. Chaplain will return with physical paperwork. Chaplain offered emotional and spiritual support to pt as she expressed her concerns for when she returns home. She feels bad having to ask for help but is trying to be more open. Chaplain offered to connect pt with social work team to see if some of her concerns about returning home could be addressed. Pt was appreciative of visit and would like follow up visits during her stay.

## 2022-08-13 DIAGNOSIS — N179 Acute kidney failure, unspecified: Secondary | ICD-10-CM | POA: Diagnosis not present

## 2022-08-13 LAB — COMPREHENSIVE METABOLIC PANEL
ALT: 40 U/L (ref 0–44)
AST: 106 U/L — ABNORMAL HIGH (ref 15–41)
Albumin: 2.9 g/dL — ABNORMAL LOW (ref 3.5–5.0)
Alkaline Phosphatase: 368 U/L — ABNORMAL HIGH (ref 38–126)
Anion gap: 3 — ABNORMAL LOW (ref 5–15)
BUN: 30 mg/dL — ABNORMAL HIGH (ref 8–23)
CO2: 22 mmol/L (ref 22–32)
Calcium: 8.1 mg/dL — ABNORMAL LOW (ref 8.9–10.3)
Chloride: 118 mmol/L — ABNORMAL HIGH (ref 98–111)
Creatinine, Ser: 1.42 mg/dL — ABNORMAL HIGH (ref 0.44–1.00)
GFR, Estimated: 38 mL/min — ABNORMAL LOW (ref >60)
Glucose, Bld: 109 mg/dL — ABNORMAL HIGH (ref 70–99)
Potassium: 3.5 mmol/L (ref 3.5–5.1)
Sodium: 143 mmol/L (ref 135–145)
Total Bilirubin: 0.4 mg/dL (ref 0.3–1.2)
Total Protein: 5.7 g/dL — ABNORMAL LOW (ref 6.5–8.1)

## 2022-08-13 LAB — URINE CULTURE: Culture: 40000 — AB

## 2022-08-13 NOTE — Discharge Summary (Signed)
Donna Evans:[id number] DOB: 1946/09/01 DOA: 08/09/2022  PCP: Jodi Marble, MD  Admit date: 08/09/2022 Discharge date: 08/13/2022  Admitted From: Home Disposition: Home  Recommendations for Outpatient Follow-up:  Follow up with PCP in 1 week Please obtain BMP/CBC in one week Please follow up GI in 1 week  Home Health: Yes   Discharge Condition:Stable CODE STATUS: Full Diet recommendation: Heart Healthy  Brief/Interim Summary: Per Donna Evans is a 76 y.o. female with medical history significant of   hypertension, stroke, GERD,depression with anxiety, migraine headache, right breast cancer (s/p mastectomy), chronic pain syndrome, dCHF, CKD stage IIIa, history of recurrent falls in setting of djd of the spine and peripheral neuropathy,COPD who presents with complaint of increasing falls as well as symptoms of uti, stating her urine has foul odor. Patient states she has fall that was different from her other falls. She states she did not had dizziness, or chest pain , or n/v/d prior to fall. She was in her normal state of health. She states she was putting her dog out and all of a sudden felt her body go weak and she was not able to hold her weight. She states she did not feel presyncope or have any prodrome prior to fall.  She had complained of being very weak.  She was found with acute kidney injury.  Also found with urinary tract infection.  She was started on IV antibiotics.  Started on IV fluids nephrology was consulted.  Her creatinine has improved.  PT OT evaluated patient and recommended home health.  She is stable for discharge.  AKI on CKDIIIa: likely due to poor po intake and diuretics renal ultrasound without any obstruction  Creatinine 5.98>>>>1.42 improved with ivf Nephrology was consulted F/u with nephrology as outpatient          UTI:  Urine culture with Klebsiella pneumonia  Treated with abx       Recurrent falls:  PT OT recommends home health       Elevated troponins:  Minimally elevated  due to demand ischemia   trended down, no chest pain       Elevated alkaline phosphatase: etiology unclear.  Bili was nml. Initially AST/ALT nml, now AST up. Spoke to Dr. Alice Reichert pt can f/u with him as outpatient for further evaluation       PZW:CHEN hctz, arb, diuretics.f/u with pcp for further management   Chronic diastolic CHF:  No acute exacerbation Stable. F/u with pcp about diuretic use.   Hx of CVA: continue on aspirin and statin   COPD: w/o exacerbation.    HLD: continue on statin    Anxiety: severity unknown. Continue home med   Migraine headache: continue home meds      Discharge Diagnoses:  Principal Problem:   AKI (acute kidney injury) ([place]) Active Problems:   Elevated troponin   Urinary tract infection   Generalized anxiety disorder   Hypertension, benign   Migraine headache   Chronic diastolic CHF (congestive heart failure) ([place])   Falls frequently   HLD (hyperlipidemia)   Elevated alkaline phosphatase level    Discharge Instructions  Discharge Instructions     Diet - low sodium heart healthy   Complete by: As directed    Discharge instructions   Complete by: As directed    F/u with pcp next week to check your blood pressure and lab work. hydrate   Increase activity slowly   Complete by: As directed       Allergies  as of 08/13/2022       Reactions   Codeine Anaphylaxis, Hives   Reaction:  Unknown    Demerol [meperidine] Anaphylaxis, Hives   Reaction:  Unknown    Latex Anaphylaxis, Rash   Morphine And Related Hives   Ciprofloxacin Nausea And Vomiting   Doxycycline Nausea And Vomiting   Flagyl [metronidazole] Nausea And Vomiting   Reaction:  Unknown    Fluconazole Hives   Influenza Vaccines Swelling   Localized swelling   Tape Other (See Comments)   Pt allergic to Adhesive tape and latex.(  Skin breaks out)   Valtrex [valacyclovir Hcl] Hives   Duloxetine Hcl Other (See Comments)    Reaction:  Unknown    Amoxicillin-pot Clavulanate Nausea And Vomiting   Sulfamethoxazole-trimethoprim Nausea And Vomiting   Valacyclovir Rash        Medication List     STOP taking these medications    hydrochlorothiazide 12.5 MG tablet Commonly known as: HYDRODIURIL   olmesartan 40 MG tablet Commonly known as: BENICAR   torsemide 20 MG tablet Commonly known as: DEMADEX       TAKE these medications    ALPRAZolam 1 MG tablet Commonly known as: XANAX Take 2 mg by mouth 2 (two) times daily.   alum & mag hydroxide-simeth 200-200-20 MG/5ML suspension Commonly known as: MAALOX/MYLANTA Take 15 mLs by mouth every 6 (six) hours as needed for indigestion or heartburn.   aspirin EC 81 MG tablet Take 1 tablet (81 mg total) by mouth daily. Swallow whole.   butorphanol 10 MG/ML nasal spray Commonly known as: STADOL Place 1 spray into the nose every 4 (four) hours as needed for headache.   calcium carbonate 500 MG chewable tablet Commonly known as: TUMS - dosed in mg elemental calcium Chew 1 tablet by mouth 3 (three) times daily as needed for indigestion or heartburn.   cyclobenzaprine 10 MG tablet Commonly known as: FLEXERIL Take 10 mg by mouth 3 (three) times daily.   rosuvastatin 10 MG tablet Commonly known as: CRESTOR Take 10 mg by mouth daily.   VITAMIN B-12 IJ Inject 1,000 mg as directed every 30 (thirty) days.   Vitamin D (Ergocalciferol) 50000 units Caps Take 1 capsule by mouth once a week.   zolpidem 10 MG tablet Commonly known as: AMBIEN Take 1 tablet (10 mg total) by mouth at bedtime for 1 day.        Follow-up Information     Jodi Marble, MD Follow up in 1 week(s).   Specialty: Internal Medicine Why: needs blood work, Civil engineer, contracting information: [place] [id number] [phone]         Anthonette Legato, MD Follow up in 2 week(s).   Specialty: Nephrology Contact information: [address] [place]  [address] [phone]         Efrain Sella, MD Follow up in 1 week(s).   Specialty: Gastroenterology Why: abnormal LFT/ALk phos Contact information: [place] [address] [phone]                Allergies  Allergen Reactions   Codeine Anaphylaxis and Hives    Reaction:  Unknown    Demerol [Meperidine] Anaphylaxis and Hives    Reaction:  Unknown     Latex Anaphylaxis and Rash   Morphine And Related Hives   Ciprofloxacin Nausea And Vomiting   Doxycycline Nausea And Vomiting   Flagyl [Metronidazole] Nausea And Vomiting    Reaction:  Unknown    Fluconazole Hives   Influenza Vaccines Swelling    Localized swelling   Tape Other (See Comments)    Pt allergic to Adhesive tape and latex.(  Skin breaks out)   Valtrex [Valacyclovir Hcl] Hives   Duloxetine Hcl Other (See Comments)    Reaction:  Unknown    Amoxicillin-Pot Clavulanate Nausea And Vomiting   Sulfamethoxazole-Trimethoprim Nausea And Vomiting   Valacyclovir Rash    Consultations: Nephrology   Procedures/Studies: US RENAL  Result Date: 08/10/2022 CLINICAL DATA:  Acute renal injury EXAM: RENAL / URINARY TRACT ULTRASOUND COMPLETE COMPARISON:  None Available. FINDINGS: Right Kidney: Renal measurements: 8.0 x 4.3 x 4.2 cm. = volume: 76 mL. 9 mm cyst is noted in the lower pole. No follow-up is recommended. No mass lesion or hydronephrosis is noted. Left Kidney: Renal measurements: 8.7 x 4.3 x 3.3 cm. = volume: 66 mL. A few small cysts are noted within the left kidney measuring up to 1.2 cm. These are consistent with simple cysts. No follow-up is recommended. Bladder: Appears normal for degree of bladder distention. Other: None. IMPRESSION: Bilateral simple renal cysts. No follow-up is recommended. No other focal abnormality is noted. Electronically Signed   By: Inez Catalina M.D.   On: 08/10/2022 03:20   DG Chest 2 View  Result Date: 08/09/2022 CLINICAL DATA:  Right rib pain after fall  EXAM: CHEST - 2 VIEW COMPARISON:  04/28/2022 FINDINGS: No acute airspace disease. No pleural effusion or pneumothorax. Stable cardiomediastinal silhouette. Multiple old appearing bilateral rib fractures. Old left proximal humerus fracture. IMPRESSION: No active cardiopulmonary disease. Electronically Signed   By: Donavan Foil M.D.   On: 08/09/2022 21:24   CT HEAD WO CONTRAST (5MM)  Result Date: 08/09/2022 CLINICAL DATA:  Multiple falls, head and neck trauma, unable to give clear history, leg weakness. Past history breast cancer, TIA, COPD, GERD, stroke, lupus, hypertension EXAM: CT HEAD WITHOUT CONTRAST CT CERVICAL SPINE WITHOUT CONTRAST TECHNIQUE: Multidetector CT imaging of the head and cervical spine was performed following the standard protocol without intravenous contrast. Multiplanar CT image reconstructions of the cervical spine were also generated. RADIATION DOSE REDUCTION: This exam was performed according to the departmental dose-optimization program which includes automated exposure control, adjustment of the mA and/or kV according to patient size and/or use of iterative reconstruction technique. COMPARISON:  CT head 04/28/2022, CT cervical spine 12/15/2021 FINDINGS: CT HEAD FINDINGS Brain: Generalized atrophy. Normal ventricular morphology. No midline shift or mass effect. Small vessel chronic ischemic changes of deep cerebral white matter. No intracranial hemorrhage, mass lesion, evidence of acute infarction, or extra-axial fluid collection. Vascular: Minimal atherosclerotic calcification of internal carotid arteries at skull base. No hyperdense vessels. Skull: Demineralized but intact Sinuses/Orbits: Small amount of mucus/fluid within RIGHT sphenoid sinus. Remaining paranasal sinuses, mastoid air cells, and middle ear cavities clear Other: N/A CT CERVICAL SPINE FINDINGS Alignment: Minimal retrolisthesis at C4-C5 and C5-C6 similar to prior exam. Remaining alignments normal. Skull base and  vertebrae: Osseous demineralization. Multilevel disc space narrowing and minimal endplate spur formation. Multilevel facet degenerative changes. Skull base intact. No fracture, additional subluxation, or bone destruction. Soft tissues and spinal canal: Prevertebral soft tissues normal thickness. Mild atherosclerotic calcifications at the RIGHT carotid bifurcation. Remaining soft tissues unremarkable. Disc levels:  No specific abnormalities Upper chest: Emphysematous changes and scarring at lung apices. Other: N/A IMPRESSION: Atrophy with small vessel chronic ischemic changes of deep cerebral white matter. No acute intracranial abnormalities. Multilevel degenerative disc and facet disease changes of the cervical  spine. No acute cervical spine abnormalities. Electronically Signed   By: Lavonia Dana M.D.   On: 08/09/2022 18:54   CT Cervical Spine Wo Contrast  Result Date: 08/09/2022 CLINICAL DATA:  Multiple falls, head and neck trauma, unable to give clear history, leg weakness. Past history breast cancer, TIA, COPD, GERD, stroke, lupus, hypertension EXAM: CT HEAD WITHOUT CONTRAST CT CERVICAL SPINE WITHOUT CONTRAST TECHNIQUE: Multidetector CT imaging of the head and cervical spine was performed following the standard protocol without intravenous contrast. Multiplanar CT image reconstructions of the cervical spine were also generated. RADIATION DOSE REDUCTION: This exam was performed according to the departmental dose-optimization program which includes automated exposure control, adjustment of the mA and/or kV according to patient size and/or use of iterative reconstruction technique. COMPARISON:  CT head 04/28/2022, CT cervical spine 12/15/2021 FINDINGS: CT HEAD FINDINGS Brain: Generalized atrophy. Normal ventricular morphology. No midline shift or mass effect. Small vessel chronic ischemic changes of deep cerebral white matter. No intracranial hemorrhage, mass lesion, evidence of acute infarction, or extra-axial  fluid collection. Vascular: Minimal atherosclerotic calcification of internal carotid arteries at skull base. No hyperdense vessels. Skull: Demineralized but intact Sinuses/Orbits: Small amount of mucus/fluid within RIGHT sphenoid sinus. Remaining paranasal sinuses, mastoid air cells, and middle ear cavities clear Other: N/A CT CERVICAL SPINE FINDINGS Alignment: Minimal retrolisthesis at C4-C5 and C5-C6 similar to prior exam. Remaining alignments normal. Skull base and vertebrae: Osseous demineralization. Multilevel disc space narrowing and minimal endplate spur formation. Multilevel facet degenerative changes. Skull base intact. No fracture, additional subluxation, or bone destruction. Soft tissues and spinal canal: Prevertebral soft tissues normal thickness. Mild atherosclerotic calcifications at the RIGHT carotid bifurcation. Remaining soft tissues unremarkable. Disc levels:  No specific abnormalities Upper chest: Emphysematous changes and scarring at lung apices. Other: N/A IMPRESSION: Atrophy with small vessel chronic ischemic changes of deep cerebral white matter. No acute intracranial abnormalities. Multilevel degenerative disc and facet disease changes of the cervical spine. No acute cervical spine abnormalities. Electronically Signed   By: Lavonia Dana M.D.   On: 08/09/2022 18:54   DG Lumbar Spine Complete  Result Date: 08/09/2022 CLINICAL DATA:  Trauma, fall EXAM: LUMBAR SPINE - COMPLETE 4+ VIEW COMPARISON:  None Available. FINDINGS: No recent fracture is seen. Alignment of posterior margins of vertebral bodies is unremarkable. Degenerative changes are noted in facet joints, more so at L5-S1 level. There are scattered arterial calcifications. Surgical clips are noted in right upper quadrant. IMPRESSION: No recent fracture is seen in the lumbar spine. Degenerative changes are noted, more so at L5-S1 level. Electronically Signed   By: Elmer Picker M.D.   On: 08/09/2022 18:45       Subjective: Feels well.  No shortness of breath or chest pain  Discharge Exam: Vitals:   08/12/22 2001 08/13/22 0356  BP: 128/65 133/73  Pulse: 74 73  Resp: 20 20  Temp: 97.8 F (36.6 C) 98.2 F (36.8 C)  SpO2: 100% 100%   Vitals:   08/12/22 1701 08/12/22 2001 08/13/22 0356 08/13/22 0359  BP: 122/75 128/65 133/73   Pulse: 71 74 73   Resp: _0 Temp: 97.7 F (36.5 C) 97.8 F (36.6 C) 98.2 F (36.8 C)   TempSrc:  Oral Oral   SpO2: 100% 100% 100%   Weight:    64 kg  Height:        General: Pt is alert, awake, not in acute distress Cardiovascular: RRR, S1/S2 +, no rubs, no gallops Respiratory: CTA bilaterally, no  wheezing, no rhonchi Abdominal: Soft, NT, ND, bowel sounds + Extremities: no edema, no cyanosis    The results of significant diagnostics from this hospitalization (including imaging, microbiology, ancillary and laboratory) are listed below for reference.     Microbiology: Recent Results (from the past 240 hour(s))  Urine Culture     Status: Abnormal   Collection Time: 08/09/22  5:39 PM   Specimen: Urine, Clean Catch  Result Value Ref Range Status   Specimen Description   Final    URINE, CLEAN CATCH Performed at [facility], [address] [place], [place] [address]    Special Requests   Final    NONE Performed at [facility], [place],  [address]    Culture (A)  Final    40,000 COLONIES/mL KLEBSIELLA PNEUMONIAE 20,000 COLONIES/mL ENTEROCOCCUS AVIUM    Report Status 08/13/2022 FINAL  Final   Organism ID, Bacteria KLEBSIELLA PNEUMONIAE (A)  Final   Organism ID, Bacteria ENTEROCOCCUS AVIUM (A)  Final      Susceptibility   Enterococcus avium - MIC*    AMPICILLIN <=2 SENSITIVE Sensitive     NITROFURANTOIN 32 SENSITIVE Sensitive     VANCOMYCIN <=0.5 SENSITIVE Sensitive     * 20,000 COLONIES/mL ENTEROCOCCUS AVIUM   Klebsiella pneumoniae - MIC*    AMPICILLIN >=32 RESISTANT Resistant      CEFAZOLIN <=4 SENSITIVE Sensitive     CEFEPIME <=0.12 SENSITIVE Sensitive     CEFTRIAXONE <=0.25 SENSITIVE Sensitive     CIPROFLOXACIN <=0.25 SENSITIVE Sensitive     GENTAMICIN <=1 SENSITIVE Sensitive     IMIPENEM <=0.25 SENSITIVE Sensitive     NITROFURANTOIN 64 INTERMEDIATE Intermediate     TRIMETH/SULFA <=20 SENSITIVE Sensitive     AMPICILLIN/SULBACTAM 4 SENSITIVE Sensitive     PIP/TAZO <=4 SENSITIVE Sensitive     * 40,000 COLONIES/mL KLEBSIELLA PNEUMONIAE     Labs: BNP (last 3 results) Recent Labs    04/28/22 1241  BNP 888.2*   Basic Metabolic Panel: Recent Labs  Lab 08/09/22 1742 08/10/22 0126 08/11/22 0338 08/12/22 0439 08/13/22 0912  NA 137 137 140 141 143  K 4.2 3.9 3.7 4.0 3.5  CL 97* 102 109 114* 118*  CO2 21* 19* _0 GLUCOSE 109* 81 87 91 109*  BUN 66* 66* 56* 45* 30*  CREATININE 7.02* 5.98* 3.26* 2.06* 1.42*  CALCIUM 9.4 8.7* 8.5* 8.0* 8.1*   Liver Function Tests: Recent Labs  Lab 08/10/22 0126 08/11/22 0338 08/13/22 0912  AST 31 36 106*  ALT 16 16 40  ALKPHOS 276* 261* 368*  BILITOT 0.6 0.4 0.4  PROT 6.9 6.0* 5.7*  ALBUMIN 3.7 3.1* 2.9*   No results for input(s): "LIPASE", "AMYLASE" in the last 168 hours. No results for input(s): "AMMONIA" in the last 168 hours. CBC: Recent Labs  Lab 08/09/22 1742 08/10/22 0126 08/11/22 0338  WBC 8.9 8.0 4.3  HGB 12.8 11.9* 10.8*  HCT 39.8 36.9 32.7*  MCV 97.1 96.6 95.1  PLT 203 197 174   Cardiac Enzymes: Recent Labs  Lab 08/09/22 1742  CKTOTAL 376*   BNP: Invalid input(s): "POCBNP" CBG: No results for input(s): "GLUCAP" in the last 168 hours. D-Dimer No results for input(s): "DDIMER" in the last 72 hours. Hgb A1c No results for input(s): "HGBA1C" in the last 72 hours. Lipid Profile No results for input(s): "CHOL", "HDL", "LDLCALC", "TRIG", "CHOLHDL", "LDLDIRECT" in the last 72 hours. Thyroid function studies No results for input(s): "TSH", "T4TOTAL", "T3FREE", "THYROIDAB"  in the last  72 hours.  Invalid input(s): "FREET3" Anemia work up No results for input(s): "VITAMINB12", "FOLATE", "FERRITIN", "TIBC", "IRON", "RETICCTPCT" in the last 72 hours. Urinalysis    Component Value Date/Time   COLORURINE YELLOW (A) 08/09/2022 1739   APPEARANCEUR HAZY (A) 08/09/2022 1739   LABSPEC 1.010 08/09/2022 1739   PHURINE 5.0 08/09/2022 1739   GLUCOSEU NEGATIVE 08/09/2022 1739   HGBUR MODERATE (A) 08/09/2022 1739   BILIRUBINUR NEGATIVE 08/09/2022 1739   BILIRUBINUR neg 11/10/2015 1352   KETONESUR NEGATIVE 08/09/2022 1739   PROTEINUR NEGATIVE 08/09/2022 1739   UROBILINOGEN negative 11/10/2015 1352   UROBILINOGEN 0.2 11/20/2013 0201   NITRITE NEGATIVE 08/09/2022 1739   LEUKOCYTESUR LARGE (A) 08/09/2022 1739   Sepsis Labs Recent Labs  Lab 08/09/22 1742 08/10/22 0126 08/11/22 0338  WBC 8.9 8.0 4.3   Microbiology Recent Results (from the past 240 hour(s))  Urine Culture     Status: Abnormal   Collection Time: 08/09/22  5:39 PM   Specimen: Urine, Clean Catch  Result Value Ref Range Status   Specimen Description   Final    URINE, CLEAN CATCH Performed at [facility], [address] [place], [place] [address]    Special Requests   Final    NONE Performed at [facility], [address] [place], [place] [address]    Culture (A)  Final    40,000 COLONIES/mL KLEBSIELLA PNEUMONIAE 20,000 COLONIES/mL ENTEROCOCCUS AVIUM    Report Status 08/13/2022 FINAL  Final   Organism ID, Bacteria KLEBSIELLA PNEUMONIAE (A)  Final   Organism ID, Bacteria ENTEROCOCCUS AVIUM (A)  Final      Susceptibility   Enterococcus avium - MIC*    AMPICILLIN <=2 SENSITIVE Sensitive     NITROFURANTOIN 32 SENSITIVE Sensitive     VANCOMYCIN <=0.5 SENSITIVE Sensitive     * 20,000 COLONIES/mL ENTEROCOCCUS AVIUM   Klebsiella pneumoniae - MIC*    AMPICILLIN >=32 RESISTANT Resistant     CEFAZOLIN <=4 SENSITIVE Sensitive     CEFEPIME <=0.12 SENSITIVE Sensitive     CEFTRIAXONE  <=0.25 SENSITIVE Sensitive     CIPROFLOXACIN <=0.25 SENSITIVE Sensitive     GENTAMICIN <=1 SENSITIVE Sensitive     IMIPENEM <=0.25 SENSITIVE Sensitive     NITROFURANTOIN 64 INTERMEDIATE Intermediate     TRIMETH/SULFA <=20 SENSITIVE Sensitive     AMPICILLIN/SULBACTAM 4 SENSITIVE Sensitive     PIP/TAZO <=4 SENSITIVE Sensitive     * 40,000 COLONIES/mL KLEBSIELLA PNEUMONIAE     Time coordinating discharge: Over 30 minutes  SIGNED:   Nolberto Hanlon, MD  [facility] 08/13/2022, 2:13 PM Pager   If 7PM-7AM, please contact night-coverage [email or web address] Password [email or web address]

## 2022-08-13 NOTE — Care Management Important Message (Signed)
Important Message  Patient Details  Name: Donna Evans MRN: [id number] Date of Birth: [birth year]   Medicare Important Message Given:  Yes     Dannette Barbara 08/13/2022, 3:13 PM

## 2022-08-13 NOTE — Progress Notes (Signed)
Reviewed AVS with PT and she verbalized understanding of all teaching and follow up instructions. IV removed without complications. PT has all her belongings and will be going via car home with family as support.

## 2022-08-13 NOTE — Progress Notes (Signed)
[facility]  ROUNDING NOTE   Subjective:   Patient seen sitting up in bed, alert and oriented No family at bedside Tolerating small meals without nausea, vomiting, or diarrhea  Creatinine 1.42  Objective:  Vital signs in last 24 hours:  Temp:  [97.7 F (36.5 C)-98.2 F (36.8 C)] 98.2 F (36.8 C) (09/01 0356) Pulse Rate:  [71-74] 73 (09/01 0356) Resp:  [16-20] 20 (09/01 0356) BP: (122-133)/(65-75) 133/73 (09/01 0356) SpO2:  [100 %] 100 % (09/01 0356) Weight:  [64 kg] 64 kg (09/01 0359)  Weight change: -0.1 kg Filed Weights   08/12/22 0501 08/12/22 0639 08/13/22 0359  Weight: 64.1 kg 64.1 kg 64 kg    Intake/Output: I/O last 3 completed shifts: In: 3555.7 [I.V.:1602.4; IV Piggyback:1953.3] Out: 250 [Urine:250]   Intake/Output this shift:  Total I/O In: 720 [P.O.:720] Out: -   Physical Exam: General: NAD  Head: Normocephalic, atraumatic. Moist oral mucosal membranes  Eyes: Anicteric  Lungs:  Clear to auscultation, normal effort, room air  Heart: Regular rate and rhythm  Abdomen:  Soft, nontender, nondistended  Extremities: No peripheral edema.  Neurologic: Nonfocal, moving all four extremities  Skin: No lesions  Access: None    Basic Metabolic Panel: Recent Labs  Lab 08/09/22 1742 08/10/22 0126 08/11/22 0338 08/12/22 0439 08/13/22 0912  NA 137 137 140 141 143  K 4.2 3.9 3.7 4.0 3.5  CL 97* 102 109 114* 118*  CO2 21* 19* '22 22 22  '$ GLUCOSE 109* 81 87 91 109*  BUN 66* 66* 56* 45* 30*  CREATININE 7.02* 5.98* 3.26* 2.06* 1.42*  CALCIUM 9.4 8.7* 8.5* 8.0* 8.1*     Liver Function Tests: Recent Labs  Lab 08/10/22 0126 08/11/22 0338 08/13/22 0912  AST 31 36 106*  ALT 16 16 40  ALKPHOS 276* 261* 368*  BILITOT 0.6 0.4 0.4  PROT 6.9 6.0* 5.7*  ALBUMIN 3.7 3.1* 2.9*    No results for input(s): "LIPASE", "AMYLASE" in the last 168 hours. No results for input(s): "AMMONIA" in the last 168 hours.  CBC: Recent Labs  Lab 08/09/22 1742  08/10/22 0126 08/11/22 0338  WBC 8.9 8.0 4.3  HGB 12.8 11.9* 10.8*  HCT 39.8 36.9 32.7*  MCV 97.1 96.6 95.1  PLT 203 197 174     Cardiac Enzymes: Recent Labs  Lab 08/09/22 1742  CKTOTAL 376*     BNP: Invalid input(s): "POCBNP"  CBG: No results for input(s): "GLUCAP" in the last 168 hours.  Microbiology: Results for orders placed or performed during the hospital encounter of 08/09/22  Urine Culture     Status: Abnormal   Collection Time: 08/09/22  5:39 PM   Specimen: Urine, Clean Catch  Result Value Ref Range Status   Specimen Description   Final    URINE, CLEAN CATCH Performed at [facility], [address], [place], [place] [address]    Special Requests   Final    NONE Performed at [facility], [place], [place] [address]    Culture (A)  Final    40,000 COLONIES/mL KLEBSIELLA PNEUMONIAE 20,000 COLONIES/mL ENTEROCOCCUS AVIUM    Report Status 08/13/2022 FINAL  Final   Organism ID, Bacteria KLEBSIELLA PNEUMONIAE (A)  Final   Organism ID, Bacteria ENTEROCOCCUS AVIUM (A)  Final      Susceptibility   Enterococcus avium - MIC*    AMPICILLIN <=2 SENSITIVE Sensitive     NITROFURANTOIN 32 SENSITIVE Sensitive     VANCOMYCIN <=0.5 SENSITIVE Sensitive     *  20,000 COLONIES/mL ENTEROCOCCUS AVIUM   Klebsiella pneumoniae - MIC*    AMPICILLIN >=32 RESISTANT Resistant     CEFAZOLIN <=4 SENSITIVE Sensitive     CEFEPIME <=0.12 SENSITIVE Sensitive     CEFTRIAXONE <=0.25 SENSITIVE Sensitive     CIPROFLOXACIN <=0.25 SENSITIVE Sensitive     GENTAMICIN <=1 SENSITIVE Sensitive     IMIPENEM <=0.25 SENSITIVE Sensitive     NITROFURANTOIN 64 INTERMEDIATE Intermediate     TRIMETH/SULFA <=20 SENSITIVE Sensitive     AMPICILLIN/SULBACTAM 4 SENSITIVE Sensitive     PIP/TAZO <=4 SENSITIVE Sensitive     * 40,000 COLONIES/mL KLEBSIELLA PNEUMONIAE    Coagulation Studies: No results for input(s): "LABPROT", "INR" in the last 72  hours.  Urinalysis: No results for input(s): "COLORURINE", "LABSPEC", "PHURINE", "GLUCOSEU", "HGBUR", "BILIRUBINUR", "KETONESUR", "PROTEINUR", "UROBILINOGEN", "NITRITE", "LEUKOCYTESUR" in the last 72 hours.  Invalid input(s): "APPERANCEUR"     Imaging: No results found.   Medications:      ALPRAZolam  2 mg Oral BID   aspirin EC  81 mg Oral Daily   heparin  5,000 Units Subcutaneous Q8H   lidocaine  1 patch Transdermal Q24H   rosuvastatin  10 mg Oral Daily   acetaminophen **OR** acetaminophen, albuterol, butorphanol, ondansetron **OR** ondansetron (ZOFRAN) IV  Assessment/ Plan:  Ms. KORY PANJWANI is a 76 y.o.  female  with past medical conditions including stroke, GERD, diastolic heart failure, hypertension, right breast cancer with mastectomy, depression, and chronic kidney disease stage IIIa, who was admitted to [facility] on 08/09/2022 for AKI (acute kidney injury) ([place]) [N17.9] Fall, initial encounter [W19.XXXA] Hypotension, unspecified hypotension type [I95.9] Urinary tract infection without hematuria, site unspecified [N39.0]   Acute kidney injury on chronic kidney disease stage IIIa.  Baseline creatinine appears to be 1.15 with GFR 49 on 05/04/2022.  Acute kidney injury appears to be secondary to hypovolemia and poor oral intake combined with continued diuretic use.No IV contrast exposure.  Renal ultrasound shows bilateral renal cyst without hydronephrosis.  Olmesartan and diuretics held.    Creatinine is almost at baseline.  Correction achieved with IV fluids and improved oral intake.  Based on substantial recovery, patient is cleared to discharge from renal stance.  We will schedule outpatient follow-up in our office at discharge in 1 to 2 weeks.  Lab Results  Component Value Date   CREATININE 1.42 (H) 08/13/2022   CREATININE 2.06 (H) 08/12/2022   CREATININE 3.26 (H) 08/11/2022    Intake/Output Summary (Last 24 hours) at 08/13/2022 1422 Last data filed at 08/13/2022 1356 Gross  per 24 hour  Intake 1570.74 ml  Output --  Net 1570.74 ml    2.  Acute metabolic acidosis, corrected   3.Anemia of chronic kidney disease  Hemoglobin within desired target  4.  Hypertension with chronic kidney disease.  Home regimen includes olmesartan, hydrochlorothiazide, and torsemide.  All currently held due to kidney injury.  Blood pressure stable    LOS: 3   9/1/20232:22 PM

## 2022-08-13 NOTE — TOC Transition Note (Signed)
Transition of Care [facility]) - CM/SW Discharge Note   Patient Details  Name: Donna Evans MRN: [id number] Date of Birth: Nov 10, 1946  [facility] CM/SW Contact:  Alberteen Sam, LCSW Phone Number: 08/13/2022, 2:48 PM   Clinical Narrative:     Patient to dc today with [facility] PT OT and RN, Brandi with [facility] informed and Danielle with [facility] to continue to follow patient at dc. No further discharge needs at this time.     Final next level of care: [place] Barriers to Discharge: No Barriers Identified   Patient Goals and CMS Choice Patient states their goals for this hospitalization and ongoing recovery are:: to go home CMS Medicare.gov Compare Post Acute Care list provided to:: Patient Choice offered to / list presented to : Patient  Discharge Placement                       Discharge Plan and Services                          HH Arranged: PT, OT, RN [facility] Agency: [place] Date [place]: 08/13/22 [place]: 1448 Representative spoke with at [place]: Brandi  Social Determinants of Health ([place]) Interventions     Readmission Risk Interventions     No data to display

## 2022-08-18 DIAGNOSIS — M5416 Radiculopathy, lumbar region: Secondary | ICD-10-CM | POA: Diagnosis not present

## 2022-08-23 DIAGNOSIS — N179 Acute kidney failure, unspecified: Secondary | ICD-10-CM | POA: Diagnosis not present

## 2022-08-23 DIAGNOSIS — D631 Anemia in chronic kidney disease: Secondary | ICD-10-CM | POA: Diagnosis not present

## 2022-08-23 DIAGNOSIS — I10 Essential (primary) hypertension: Secondary | ICD-10-CM | POA: Diagnosis not present

## 2022-08-23 DIAGNOSIS — N1831 Chronic kidney disease, stage 3a: Secondary | ICD-10-CM | POA: Diagnosis not present

## 2022-08-24 DIAGNOSIS — I10 Essential (primary) hypertension: Secondary | ICD-10-CM | POA: Diagnosis not present

## 2022-08-24 DIAGNOSIS — Z09 Encounter for follow-up examination after completed treatment for conditions other than malignant neoplasm: Secondary | ICD-10-CM | POA: Diagnosis not present

## 2022-08-24 DIAGNOSIS — R002 Palpitations: Secondary | ICD-10-CM | POA: Diagnosis not present

## 2022-08-24 DIAGNOSIS — R0602 Shortness of breath: Secondary | ICD-10-CM | POA: Diagnosis not present

## 2022-08-24 DIAGNOSIS — I872 Venous insufficiency (chronic) (peripheral): Secondary | ICD-10-CM | POA: Diagnosis not present

## 2022-08-24 DIAGNOSIS — I5031 Acute diastolic (congestive) heart failure: Secondary | ICD-10-CM | POA: Diagnosis not present

## 2022-08-24 DIAGNOSIS — N183 Chronic kidney disease, stage 3 unspecified: Secondary | ICD-10-CM | POA: Diagnosis not present

## 2022-08-24 DIAGNOSIS — R296 Repeated falls: Secondary | ICD-10-CM | POA: Diagnosis not present

## 2022-09-01 DIAGNOSIS — G8929 Other chronic pain: Secondary | ICD-10-CM | POA: Diagnosis not present

## 2022-09-01 DIAGNOSIS — N179 Acute kidney failure, unspecified: Secondary | ICD-10-CM | POA: Diagnosis not present

## 2022-09-01 DIAGNOSIS — K219 Gastro-esophageal reflux disease without esophagitis: Secondary | ICD-10-CM | POA: Diagnosis not present

## 2022-09-01 DIAGNOSIS — G4709 Other insomnia: Secondary | ICD-10-CM | POA: Diagnosis not present

## 2022-09-01 DIAGNOSIS — N1831 Chronic kidney disease, stage 3a: Secondary | ICD-10-CM | POA: Diagnosis not present

## 2022-09-01 DIAGNOSIS — I739 Peripheral vascular disease, unspecified: Secondary | ICD-10-CM | POA: Diagnosis not present

## 2022-09-01 DIAGNOSIS — M810 Age-related osteoporosis without current pathological fracture: Secondary | ICD-10-CM | POA: Diagnosis not present

## 2022-09-01 DIAGNOSIS — J301 Allergic rhinitis due to pollen: Secondary | ICD-10-CM | POA: Diagnosis not present

## 2022-09-01 DIAGNOSIS — I10 Essential (primary) hypertension: Secondary | ICD-10-CM | POA: Diagnosis not present

## 2022-09-01 DIAGNOSIS — N189 Chronic kidney disease, unspecified: Secondary | ICD-10-CM | POA: Diagnosis not present

## 2022-09-01 DIAGNOSIS — E785 Hyperlipidemia, unspecified: Secondary | ICD-10-CM | POA: Diagnosis not present

## 2022-09-01 DIAGNOSIS — F411 Generalized anxiety disorder: Secondary | ICD-10-CM | POA: Diagnosis not present

## 2022-09-01 DIAGNOSIS — M7061 Trochanteric bursitis, right hip: Secondary | ICD-10-CM | POA: Diagnosis not present

## 2022-09-01 DIAGNOSIS — N178 Other acute kidney failure: Secondary | ICD-10-CM | POA: Diagnosis not present

## 2022-09-03 DIAGNOSIS — M797 Fibromyalgia: Secondary | ICD-10-CM | POA: Diagnosis not present

## 2022-09-03 DIAGNOSIS — N390 Urinary tract infection, site not specified: Secondary | ICD-10-CM | POA: Diagnosis not present

## 2022-09-03 DIAGNOSIS — I509 Heart failure, unspecified: Secondary | ICD-10-CM | POA: Diagnosis not present

## 2022-09-03 DIAGNOSIS — N1831 Chronic kidney disease, stage 3a: Secondary | ICD-10-CM | POA: Diagnosis not present

## 2022-09-03 DIAGNOSIS — Z853 Personal history of malignant neoplasm of breast: Secondary | ICD-10-CM | POA: Diagnosis not present

## 2022-09-03 DIAGNOSIS — Z9181 History of falling: Secondary | ICD-10-CM | POA: Diagnosis not present

## 2022-09-03 DIAGNOSIS — J449 Chronic obstructive pulmonary disease, unspecified: Secondary | ICD-10-CM | POA: Diagnosis not present

## 2022-09-03 DIAGNOSIS — G894 Chronic pain syndrome: Secondary | ICD-10-CM | POA: Diagnosis not present

## 2022-09-03 DIAGNOSIS — G43909 Migraine, unspecified, not intractable, without status migrainosus: Secondary | ICD-10-CM | POA: Diagnosis not present

## 2022-09-03 DIAGNOSIS — Z7982 Long term (current) use of aspirin: Secondary | ICD-10-CM | POA: Diagnosis not present

## 2022-09-03 DIAGNOSIS — Z8673 Personal history of transient ischemic attack (TIA), and cerebral infarction without residual deficits: Secondary | ICD-10-CM | POA: Diagnosis not present

## 2022-09-03 DIAGNOSIS — F418 Other specified anxiety disorders: Secondary | ICD-10-CM | POA: Diagnosis not present

## 2022-09-03 DIAGNOSIS — G629 Polyneuropathy, unspecified: Secondary | ICD-10-CM | POA: Diagnosis not present

## 2022-09-03 DIAGNOSIS — M1990 Unspecified osteoarthritis, unspecified site: Secondary | ICD-10-CM | POA: Diagnosis not present

## 2022-09-03 DIAGNOSIS — Z9011 Acquired absence of right breast and nipple: Secondary | ICD-10-CM | POA: Diagnosis not present

## 2022-09-03 DIAGNOSIS — I130 Hypertensive heart and chronic kidney disease with heart failure and stage 1 through stage 4 chronic kidney disease, or unspecified chronic kidney disease: Secondary | ICD-10-CM | POA: Diagnosis not present

## 2022-09-03 DIAGNOSIS — K219 Gastro-esophageal reflux disease without esophagitis: Secondary | ICD-10-CM | POA: Diagnosis not present

## 2022-09-16 DIAGNOSIS — Z9181 History of falling: Secondary | ICD-10-CM | POA: Diagnosis not present

## 2022-09-16 DIAGNOSIS — Z9011 Acquired absence of right breast and nipple: Secondary | ICD-10-CM | POA: Diagnosis not present

## 2022-09-16 DIAGNOSIS — Z7982 Long term (current) use of aspirin: Secondary | ICD-10-CM | POA: Diagnosis not present

## 2022-09-16 DIAGNOSIS — G43909 Migraine, unspecified, not intractable, without status migrainosus: Secondary | ICD-10-CM | POA: Diagnosis not present

## 2022-09-16 DIAGNOSIS — N1831 Chronic kidney disease, stage 3a: Secondary | ICD-10-CM | POA: Diagnosis not present

## 2022-09-16 DIAGNOSIS — I130 Hypertensive heart and chronic kidney disease with heart failure and stage 1 through stage 4 chronic kidney disease, or unspecified chronic kidney disease: Secondary | ICD-10-CM | POA: Diagnosis not present

## 2022-09-16 DIAGNOSIS — N390 Urinary tract infection, site not specified: Secondary | ICD-10-CM | POA: Diagnosis not present

## 2022-09-16 DIAGNOSIS — M797 Fibromyalgia: Secondary | ICD-10-CM | POA: Diagnosis not present

## 2022-09-16 DIAGNOSIS — Z8673 Personal history of transient ischemic attack (TIA), and cerebral infarction without residual deficits: Secondary | ICD-10-CM | POA: Diagnosis not present

## 2022-09-16 DIAGNOSIS — I509 Heart failure, unspecified: Secondary | ICD-10-CM | POA: Diagnosis not present

## 2022-09-16 DIAGNOSIS — M1990 Unspecified osteoarthritis, unspecified site: Secondary | ICD-10-CM | POA: Diagnosis not present

## 2022-09-16 DIAGNOSIS — J449 Chronic obstructive pulmonary disease, unspecified: Secondary | ICD-10-CM | POA: Diagnosis not present

## 2022-09-16 DIAGNOSIS — Z853 Personal history of malignant neoplasm of breast: Secondary | ICD-10-CM | POA: Diagnosis not present

## 2022-09-16 DIAGNOSIS — G629 Polyneuropathy, unspecified: Secondary | ICD-10-CM | POA: Diagnosis not present

## 2022-09-16 DIAGNOSIS — F418 Other specified anxiety disorders: Secondary | ICD-10-CM | POA: Diagnosis not present

## 2022-09-16 DIAGNOSIS — K219 Gastro-esophageal reflux disease without esophagitis: Secondary | ICD-10-CM | POA: Diagnosis not present

## 2022-09-16 DIAGNOSIS — G894 Chronic pain syndrome: Secondary | ICD-10-CM | POA: Diagnosis not present

## 2022-09-24 DIAGNOSIS — E785 Hyperlipidemia, unspecified: Secondary | ICD-10-CM | POA: Diagnosis not present

## 2022-09-24 DIAGNOSIS — N189 Chronic kidney disease, unspecified: Secondary | ICD-10-CM | POA: Diagnosis not present

## 2022-09-27 DIAGNOSIS — M810 Age-related osteoporosis without current pathological fracture: Secondary | ICD-10-CM | POA: Diagnosis not present

## 2022-09-27 DIAGNOSIS — Z1331 Encounter for screening for depression: Secondary | ICD-10-CM | POA: Diagnosis not present

## 2022-09-27 DIAGNOSIS — Z23 Encounter for immunization: Secondary | ICD-10-CM | POA: Diagnosis not present

## 2022-09-27 DIAGNOSIS — J301 Allergic rhinitis due to pollen: Secondary | ICD-10-CM | POA: Diagnosis not present

## 2022-09-27 DIAGNOSIS — G8929 Other chronic pain: Secondary | ICD-10-CM | POA: Diagnosis not present

## 2022-09-27 DIAGNOSIS — N189 Chronic kidney disease, unspecified: Secondary | ICD-10-CM | POA: Diagnosis not present

## 2022-09-27 DIAGNOSIS — I10 Essential (primary) hypertension: Secondary | ICD-10-CM | POA: Diagnosis not present

## 2022-09-27 DIAGNOSIS — G4709 Other insomnia: Secondary | ICD-10-CM | POA: Diagnosis not present

## 2022-09-27 DIAGNOSIS — M7061 Trochanteric bursitis, right hip: Secondary | ICD-10-CM | POA: Diagnosis not present

## 2022-09-27 DIAGNOSIS — Z0001 Encounter for general adult medical examination with abnormal findings: Secondary | ICD-10-CM | POA: Diagnosis not present

## 2022-09-27 DIAGNOSIS — K219 Gastro-esophageal reflux disease without esophagitis: Secondary | ICD-10-CM | POA: Diagnosis not present

## 2022-09-27 DIAGNOSIS — I739 Peripheral vascular disease, unspecified: Secondary | ICD-10-CM | POA: Diagnosis not present

## 2022-09-27 DIAGNOSIS — F411 Generalized anxiety disorder: Secondary | ICD-10-CM | POA: Diagnosis not present

## 2022-10-06 ENCOUNTER — Emergency Department: Payer: PPO

## 2022-10-06 ENCOUNTER — Other Ambulatory Visit: Payer: Self-pay

## 2022-10-06 ENCOUNTER — Observation Stay: Payer: PPO

## 2022-10-06 ENCOUNTER — Inpatient Hospital Stay
Admission: EM | Admit: 2022-10-06 | Discharge: 2022-10-18 | DRG: 884 | Disposition: A | Discharge status: Home Health | Payer: PPO | Attending: Osteopathic Medicine | Admitting: Osteopathic Medicine

## 2022-10-06 DIAGNOSIS — R55 Syncope and collapse: Secondary | ICD-10-CM

## 2022-10-06 DIAGNOSIS — J449 Chronic obstructive pulmonary disease, unspecified: Secondary | ICD-10-CM | POA: Diagnosis present

## 2022-10-06 DIAGNOSIS — Z79899 Other long term (current) drug therapy: Secondary | ICD-10-CM | POA: Diagnosis not present

## 2022-10-06 DIAGNOSIS — I4891 Unspecified atrial fibrillation: Secondary | ICD-10-CM | POA: Diagnosis not present

## 2022-10-06 DIAGNOSIS — M797 Fibromyalgia: Secondary | ICD-10-CM | POA: Diagnosis present

## 2022-10-06 DIAGNOSIS — N189 Chronic kidney disease, unspecified: Secondary | ICD-10-CM | POA: Diagnosis not present

## 2022-10-06 DIAGNOSIS — R9431 Abnormal electrocardiogram [ECG] [EKG]: Secondary | ICD-10-CM | POA: Diagnosis present

## 2022-10-06 DIAGNOSIS — Z87891 Personal history of nicotine dependence: Secondary | ICD-10-CM

## 2022-10-06 DIAGNOSIS — I890 Lymphedema, not elsewhere classified: Secondary | ICD-10-CM | POA: Diagnosis present

## 2022-10-06 DIAGNOSIS — I480 Paroxysmal atrial fibrillation: Secondary | ICD-10-CM | POA: Diagnosis present

## 2022-10-06 DIAGNOSIS — W19XXXA Unspecified fall, initial encounter: Secondary | ICD-10-CM | POA: Diagnosis not present

## 2022-10-06 DIAGNOSIS — G894 Chronic pain syndrome: Secondary | ICD-10-CM | POA: Diagnosis present

## 2022-10-06 DIAGNOSIS — Z853 Personal history of malignant neoplasm of breast: Secondary | ICD-10-CM | POA: Diagnosis not present

## 2022-10-06 DIAGNOSIS — Z7982 Long term (current) use of aspirin: Secondary | ICD-10-CM

## 2022-10-06 DIAGNOSIS — I10 Essential (primary) hypertension: Secondary | ICD-10-CM | POA: Diagnosis not present

## 2022-10-06 DIAGNOSIS — R4182 Altered mental status, unspecified: Secondary | ICD-10-CM | POA: Diagnosis not present

## 2022-10-06 DIAGNOSIS — F05 Delirium due to known physiological condition: Secondary | ICD-10-CM | POA: Diagnosis present

## 2022-10-06 DIAGNOSIS — M1990 Unspecified osteoarthritis, unspecified site: Secondary | ICD-10-CM | POA: Diagnosis present

## 2022-10-06 DIAGNOSIS — N1831 Chronic kidney disease, stage 3a: Secondary | ICD-10-CM | POA: Diagnosis present

## 2022-10-06 DIAGNOSIS — R7401 Elevation of levels of liver transaminase levels: Secondary | ICD-10-CM | POA: Diagnosis not present

## 2022-10-06 DIAGNOSIS — R296 Repeated falls: Secondary | ICD-10-CM | POA: Diagnosis present

## 2022-10-06 DIAGNOSIS — Z043 Encounter for examination and observation following other accident: Secondary | ICD-10-CM | POA: Diagnosis not present

## 2022-10-06 DIAGNOSIS — R7989 Other specified abnormal findings of blood chemistry: Secondary | ICD-10-CM | POA: Diagnosis not present

## 2022-10-06 DIAGNOSIS — Z803 Family history of malignant neoplasm of breast: Secondary | ICD-10-CM

## 2022-10-06 DIAGNOSIS — G9340 Encephalopathy, unspecified: Secondary | ICD-10-CM | POA: Diagnosis present

## 2022-10-06 DIAGNOSIS — Z882 Allergy status to sulfonamides status: Secondary | ICD-10-CM

## 2022-10-06 DIAGNOSIS — I130 Hypertensive heart and chronic kidney disease with heart failure and stage 1 through stage 4 chronic kidney disease, or unspecified chronic kidney disease: Secondary | ICD-10-CM | POA: Diagnosis present

## 2022-10-06 DIAGNOSIS — S2241XA Multiple fractures of ribs, right side, initial encounter for closed fracture: Secondary | ICD-10-CM | POA: Diagnosis not present

## 2022-10-06 DIAGNOSIS — Z9882 Breast implant status: Secondary | ICD-10-CM | POA: Diagnosis not present

## 2022-10-06 DIAGNOSIS — K760 Fatty (change of) liver, not elsewhere classified: Secondary | ICD-10-CM | POA: Diagnosis present

## 2022-10-06 DIAGNOSIS — I5042 Chronic combined systolic (congestive) and diastolic (congestive) heart failure: Secondary | ICD-10-CM | POA: Diagnosis present

## 2022-10-06 DIAGNOSIS — I4510 Unspecified right bundle-branch block: Secondary | ICD-10-CM | POA: Diagnosis present

## 2022-10-06 DIAGNOSIS — Z91040 Latex allergy status: Secondary | ICD-10-CM

## 2022-10-06 DIAGNOSIS — R404 Transient alteration of awareness: Secondary | ICD-10-CM | POA: Diagnosis not present

## 2022-10-06 DIAGNOSIS — Z66 Do not resuscitate: Secondary | ICD-10-CM | POA: Diagnosis present

## 2022-10-06 DIAGNOSIS — F03B2 Unspecified dementia, moderate, with psychotic disturbance: Principal | ICD-10-CM | POA: Diagnosis present

## 2022-10-06 DIAGNOSIS — S0990XA Unspecified injury of head, initial encounter: Secondary | ICD-10-CM

## 2022-10-06 DIAGNOSIS — Z885 Allergy status to narcotic agent status: Secondary | ICD-10-CM

## 2022-10-06 DIAGNOSIS — Z833 Family history of diabetes mellitus: Secondary | ICD-10-CM

## 2022-10-06 DIAGNOSIS — R600 Localized edema: Secondary | ICD-10-CM

## 2022-10-06 DIAGNOSIS — Z8673 Personal history of transient ischemic attack (TIA), and cerebral infarction without residual deficits: Secondary | ICD-10-CM

## 2022-10-06 DIAGNOSIS — I129 Hypertensive chronic kidney disease with stage 1 through stage 4 chronic kidney disease, or unspecified chronic kidney disease: Secondary | ICD-10-CM | POA: Diagnosis not present

## 2022-10-06 DIAGNOSIS — R220 Localized swelling, mass and lump, head: Secondary | ICD-10-CM | POA: Diagnosis not present

## 2022-10-06 DIAGNOSIS — J189 Pneumonia, unspecified organism: Secondary | ICD-10-CM

## 2022-10-06 DIAGNOSIS — M25511 Pain in right shoulder: Secondary | ICD-10-CM | POA: Diagnosis not present

## 2022-10-06 DIAGNOSIS — W1830XA Fall on same level, unspecified, initial encounter: Secondary | ICD-10-CM | POA: Diagnosis present

## 2022-10-06 DIAGNOSIS — K219 Gastro-esophageal reflux disease without esophagitis: Secondary | ICD-10-CM | POA: Diagnosis present

## 2022-10-06 DIAGNOSIS — Z881 Allergy status to other antibiotic agents status: Secondary | ICD-10-CM

## 2022-10-06 DIAGNOSIS — G9349 Other encephalopathy: Secondary | ICD-10-CM | POA: Diagnosis not present

## 2022-10-06 DIAGNOSIS — G319 Degenerative disease of nervous system, unspecified: Secondary | ICD-10-CM | POA: Diagnosis not present

## 2022-10-06 DIAGNOSIS — E785 Hyperlipidemia, unspecified: Secondary | ICD-10-CM | POA: Diagnosis present

## 2022-10-06 DIAGNOSIS — Z1152 Encounter for screening for COVID-19: Secondary | ICD-10-CM

## 2022-10-06 DIAGNOSIS — Z01810 Encounter for preprocedural cardiovascular examination: Secondary | ICD-10-CM | POA: Diagnosis not present

## 2022-10-06 DIAGNOSIS — Z9181 History of falling: Secondary | ICD-10-CM

## 2022-10-06 DIAGNOSIS — Z880 Allergy status to penicillin: Secondary | ICD-10-CM

## 2022-10-06 DIAGNOSIS — Z888 Allergy status to other drugs, medicaments and biological substances status: Secondary | ICD-10-CM

## 2022-10-06 DIAGNOSIS — R748 Abnormal levels of other serum enzymes: Secondary | ICD-10-CM | POA: Diagnosis not present

## 2022-10-06 LAB — URINALYSIS, COMPLETE (UACMP) WITH MICROSCOPIC
Bacteria, UA: NONE SEEN
Bilirubin Urine: NEGATIVE
Glucose, UA: NEGATIVE mg/dL
Hgb urine dipstick: NEGATIVE
Ketones, ur: NEGATIVE mg/dL
Leukocytes,Ua: NEGATIVE
Nitrite: NEGATIVE
Protein, ur: 30 mg/dL — AB
Specific Gravity, Urine: 1.013 (ref 1.005–1.030)
pH: 7 (ref 5.0–8.0)

## 2022-10-06 LAB — CBC WITH DIFFERENTIAL/PLATELET
Abs Immature Granulocytes: 0.01 10*3/uL (ref 0.00–0.07)
Basophils Absolute: 0 10*3/uL (ref 0.0–0.1)
Basophils Relative: 1 %
Eosinophils Absolute: 0.2 10*3/uL (ref 0.0–0.5)
Eosinophils Relative: 3 %
HCT: 40.4 % (ref 36.0–46.0)
Hemoglobin: 12.7 g/dL (ref 12.0–15.0)
Immature Granulocytes: 0 %
Lymphocytes Relative: 14 %
Lymphs Abs: 1 10*3/uL (ref 0.7–4.0)
MCH: 29.7 pg (ref 26.0–34.0)
MCHC: 31.4 g/dL (ref 30.0–36.0)
MCV: 94.6 fL (ref 80.0–100.0)
Monocytes Absolute: 0.6 10*3/uL (ref 0.1–1.0)
Monocytes Relative: 8 %
Neutro Abs: 5.2 10*3/uL (ref 1.7–7.7)
Neutrophils Relative %: 74 %
Platelets: 219 10*3/uL (ref 150–400)
RBC: 4.27 MIL/uL (ref 3.87–5.11)
RDW: 14.4 % (ref 11.5–15.5)
WBC: 7 10*3/uL (ref 4.0–10.5)
nRBC: 0 % (ref 0.0–0.2)

## 2022-10-06 LAB — COMPREHENSIVE METABOLIC PANEL
ALT: 25 U/L (ref 0–44)
AST: 50 U/L — ABNORMAL HIGH (ref 15–41)
Albumin: 3.4 g/dL — ABNORMAL LOW (ref 3.5–5.0)
Alkaline Phosphatase: 638 U/L — ABNORMAL HIGH (ref 38–126)
Anion gap: 8 (ref 5–15)
BUN: 21 mg/dL (ref 8–23)
CO2: 28 mmol/L (ref 22–32)
Calcium: 8.7 mg/dL — ABNORMAL LOW (ref 8.9–10.3)
Chloride: 107 mmol/L (ref 98–111)
Creatinine, Ser: 1 mg/dL (ref 0.44–1.00)
GFR, Estimated: 58 mL/min — ABNORMAL LOW (ref >60)
Glucose, Bld: 122 mg/dL — ABNORMAL HIGH (ref 70–99)
Potassium: 3.1 mmol/L — ABNORMAL LOW (ref 3.5–5.1)
Sodium: 143 mmol/L (ref 135–145)
Total Bilirubin: 1.3 mg/dL — ABNORMAL HIGH (ref 0.3–1.2)
Total Protein: 7.1 g/dL (ref 6.5–8.1)

## 2022-10-06 LAB — CBG MONITORING, ED: Glucose-Capillary: 108 mg/dL — ABNORMAL HIGH (ref 70–99)

## 2022-10-06 LAB — BRAIN NATRIURETIC PEPTIDE: B Natriuretic Peptide: 294.1 pg/mL — ABNORMAL HIGH (ref 0.0–100.0)

## 2022-10-06 LAB — PROTIME-INR
INR: 1 (ref 0.8–1.2)
Prothrombin Time: 12.6 seconds (ref 11.4–15.2)

## 2022-10-06 LAB — RESP PANEL BY RT-PCR (FLU A&B, COVID) ARPGX2
Influenza A by PCR: NEGATIVE
Influenza B by PCR: NEGATIVE
SARS Coronavirus 2 by RT PCR: NEGATIVE

## 2022-10-06 LAB — TROPONIN I (HIGH SENSITIVITY): Troponin I (High Sensitivity): 13 ng/L (ref <18)

## 2022-10-06 LAB — LACTIC ACID, PLASMA: Lactic Acid, Venous: 1.1 mmol/L (ref 0.5–1.9)

## 2022-10-06 LAB — AMMONIA: Ammonia: 24 umol/L (ref 9–35)

## 2022-10-06 LAB — APTT: aPTT: 28 seconds (ref 24–36)

## 2022-10-06 LAB — CK: Total CK: 105 U/L (ref 38–234)

## 2022-10-06 MED ORDER — BUTORPHANOL TARTRATE 10 MG/ML NA SOLN: 1.0000 | Freq: Three times a day (TID) | NASAL | Status: DC | PRN

## 2022-10-06 MED ORDER — ASPIRIN 300 MG RE SUPP
300.0000 mg | Freq: Once | RECTAL | Status: AC
  Administered 2022-10-06: 300 mg via RECTAL
  Filled 2022-10-06: qty 1

## 2022-10-06 MED ORDER — THIAMINE HCL 100 MG/ML IJ SOLN
100.0000 mg | Freq: Every day | INTRAMUSCULAR | Status: DC
  Administered 2022-10-06: 100 mg via INTRAVENOUS
  Filled 2022-10-06 (×2): qty 2

## 2022-10-06 MED ORDER — ROSUVASTATIN CALCIUM 10 MG PO TABS
10.0000 mg | ORAL_TABLET | Freq: Every day | ORAL | Status: DC
  Administered 2022-10-08 – 2022-10-18 (×11): 10 mg via ORAL
  Filled 2022-10-06 (×11): qty 1

## 2022-10-06 MED ORDER — ACETAMINOPHEN 325 MG PO TABS
650.0000 mg | ORAL_TABLET | Freq: Four times a day (QID) | ORAL | Status: DC | PRN
  Administered 2022-10-08 – 2022-10-16 (×10): 650 mg via ORAL
  Filled 2022-10-06 (×10): qty 2

## 2022-10-06 MED ORDER — SODIUM CHLORIDE 0.9% FLUSH
3.0000 mL | Freq: Two times a day (BID) | INTRAVENOUS | Status: DC
  Administered 2022-10-06 – 2022-10-18 (×20): 3 mL via INTRAVENOUS

## 2022-10-06 MED ORDER — POTASSIUM CHLORIDE 10 MEQ/100ML IV SOLN
10.0000 meq | INTRAVENOUS | Status: AC
  Administered 2022-10-06 – 2022-10-07 (×4): 10 meq via INTRAVENOUS
  Filled 2022-10-06 (×4): qty 100

## 2022-10-06 MED ORDER — ALPRAZOLAM 1 MG PO TABS: 1.0000 mg | ORAL_TABLET | Freq: Two times a day (BID) | ORAL | Status: DC | PRN

## 2022-10-06 MED ORDER — SODIUM CHLORIDE 0.9 % IV SOLN
500.0000 mg | Freq: Once | INTRAVENOUS | Status: AC
  Administered 2022-10-06: 500 mg via INTRAVENOUS
  Filled 2022-10-06: qty 5

## 2022-10-06 MED ORDER — ACETAMINOPHEN 650 MG RE SUPP: 650.0000 mg | Freq: Four times a day (QID) | RECTAL | Status: DC | PRN

## 2022-10-06 MED ORDER — FOLIC ACID 5 MG/ML IJ SOLN
1.0000 mg | Freq: Every day | INTRAMUSCULAR | Status: DC
  Administered 2022-10-06 – 2022-10-09 (×3): 1 mg via INTRAVENOUS
  Filled 2022-10-06 (×5): qty 0.2

## 2022-10-06 MED ORDER — LACTATED RINGERS IV SOLN: INTRAVENOUS | Status: AC

## 2022-10-06 MED ORDER — ONDANSETRON HCL 4 MG/2ML IJ SOLN
4.0000 mg | Freq: Four times a day (QID) | INTRAMUSCULAR | Status: DC | PRN
  Administered 2022-10-10 – 2022-10-15 (×3): 4 mg via INTRAVENOUS
  Filled 2022-10-06 (×3): qty 2

## 2022-10-06 MED ORDER — ONDANSETRON HCL 4 MG PO TABS: 4.0000 mg | ORAL_TABLET | Freq: Four times a day (QID) | ORAL | Status: DC | PRN

## 2022-10-06 MED ORDER — SODIUM CHLORIDE 0.9 % IV BOLUS
1000.0000 mL | Freq: Once | INTRAVENOUS | Status: AC
  Administered 2022-10-06: 1000 mL via INTRAVENOUS

## 2022-10-06 MED ORDER — ADULT MULTIVITAMIN W/MINERALS CH
1.0000 | ORAL_TABLET | Freq: Every day | ORAL | Status: DC
  Administered 2022-10-08 – 2022-10-18 (×11): 1 via ORAL
  Filled 2022-10-06 (×11): qty 1

## 2022-10-06 MED ORDER — SODIUM CHLORIDE 0.9 % IV SOLN
1.0000 g | Freq: Once | INTRAVENOUS | Status: AC
  Administered 2022-10-06: 1 g via INTRAVENOUS
  Filled 2022-10-06: qty 10

## 2022-10-06 MED ORDER — ENOXAPARIN SODIUM 40 MG/0.4ML IJ SOSY
40.0000 mg | PREFILLED_SYRINGE | INTRAMUSCULAR | Status: DC
  Administered 2022-10-06 – 2022-10-13 (×8): 40 mg via SUBCUTANEOUS
  Filled 2022-10-06 (×8): qty 0.4

## 2022-10-06 NOTE — Assessment & Plan Note (Signed)
CT with evidence of subcutaneous edema but no hematoma noted.  Chest x-ray with age-indeterminate rib fractures.  Rib studies obtained and fractures appear chronic.  -PT/OT evaluation prior to discharge

## 2022-10-06 NOTE — Assessment & Plan Note (Signed)
Patient has a long-term history of bilateral lymphedema, however on examination, her left calf appears larger than her right.  Patient states she has a history of DVT.  - Holding home torsemide given hypovolemia on examination - Bilateral venous Dopplers of lower extremities - If Dopplers are negative, consider ordering [employer]

## 2022-10-06 NOTE — H&P (Signed)
History and Physical    Patient: Donna Evans KDT:[id number] DOB: 25-Nov-1946 DOA: 10/06/2022 DOS: the patient was seen and examined on 10/06/2022 PCP: Jodi Marble, MD  Patient coming from: Home  Chief Complaint:  Chief Complaint  Patient presents with   Fall   HPI: [name] is a 76 y.o. female with medical history significant of hypertension, CVA, depression with anxiety, migraine headache, right breast cancer s/p mastectomy, chronic pain syndrome, HFpEF, CKD stage IIIa, COPD, SLE who presents to the ED after a ground-level fall.  [name] states that she felt "okay but not completely normal" yesterday but cannot describe to me what was bothering her at the time.  She states that this morning, she was able to get up as she normally does not get breakfast.  However, she cannot recall what occurred prior to falling to the ground and cannot remember when her family came inside and found her.  She is unsure if she hit her head.  She denies any recent fever, chills, chest pain.  She endorses recent intermittent palpitations and shortness of breath but cannot recall if they occurred yesterday or today.  I contacted patient's niece, Mickel Baas who found patient on the floor today.  Mickel Baas states that she checked on her aunt around 7:30 AM and found it odd that [name] was not answering the doorbell.  A companion that has a door key arrived around the same time and when they entered the home, they found [name] on the ground.  Mickel Baas remarks that there was a knocked over a lamp and round table.  At the time, [name] eyes were open and but seemed "glazed over."  [name] was shivering.  Her speech seemed more altered than usual.  Mickel Baas states that [name] has been having trouble with balance and frequent falls lately.   ED course: On arrival to the ED, patient was hypertensive at 169/76 with heart rate of 70.  She was hypothermic at 95.3. Initial work-up remarkable for normal  CBC, lactic acid, troponin, and CK.  CMP notable for potassium of 3.1, AST of 15, alkaline phosphatase of 638, total bili of 1.3.  CT head was obtained that did not show any acute abnormality other than soft tissue swelling over the left parietal scalp.  TRH contacted for admission for acute encephalopathy.  Review of Systems: As mentioned in the history of present illness. All other systems reviewed and are negative.  Past Medical History:  Diagnosis Date   Allergic state    Anginal pain ([place])    Prinzmetal's angina   Anxiety    Arthritis    osteoarthritis   Breast cancer ([place]) 1990   right breast cancer   Chronic pain    Chronic pain    COPD (chronic obstructive pulmonary disease) (HCC)    Depression    Edema    Fibromyalgia    Foot fracture    Bilateral   GERD (gastroesophageal reflux disease)    History of kidney stones    Hypertension    Leg fracture, right    Low back pain    Lumbosacral neuritis    Medulloadrenal hyperfunction (HCC)    Migraine headache    Peripheral neuropathy    Shoulder fracture, right    Stroke [facility])    TIA   Systemic lupus erythematosus ([place])    Thyroid disease    TIA (transient ischemic attack) 11/28/2013   Transient global amnesia 2011   Past Surgical History:  Procedure Laterality Date   ABDOMINAL HYSTERECTOMY  1981   APPENDECTOMY  1957   AUGMENTATION MAMMAPLASTY Bilateral 1990   saline submuscular   BACK SURGERY  1989   BILATERAL TOTAL MASTECTOMY WITH AXILLARY LYMPH NODE DISSECTION  1990   BREAST IMPLANT EXCHANGE Bilateral 05/11/2016   Procedure: REMOVAL AND REPLACEMENT OF BREAST IMPLANTS ;  Surgeon: Irene Limbo, MD;  Location: [place];  Service: Plastics;  Laterality: Bilateral;   CAPSULECTOMY Bilateral 05/11/2016   Procedure: CAPSULECTOMY CAPSULORRAPHY ;  Surgeon: Irene Limbo, MD;  Location: [place];  Service: Plastics;  Laterality: Bilateral;   CATARACT EXTRACTION W/PHACO Right 12/26/2018    Procedure: CATARACT EXTRACTION PHACO AND INTRAOCULAR LENS PLACEMENT ([place]) RIGHT;  Surgeon: Birder Robson, MD;  Location: [facility];  Service: Ophthalmology;  Laterality: Right;  [place]  00:46 CDE 8.50 Fluid pack lot # [id number]   CATARACT EXTRACTION W/PHACO Left 01/23/2019   Procedure: CATARACT EXTRACTION PHACO AND INTRAOCULAR LENS PLACEMENT ([place]) LEFT;  Surgeon: Birder Robson, MD;  Location: [facility];  Service: Ophthalmology;  Laterality: Left;  [place]  00:45 CDE  8.41 fluid pack lot # [id number]   CHOLECYSTECTOMY  1979   COLONOSCOPY WITH PROPOFOL N/A 01/25/2018   Procedure: COLONOSCOPY WITH PROPOFOL;  Surgeon: Manya Silvas, MD;  [facility];  Service: Endoscopy;  Laterality: N/A;   COPD     ESOPHAGOGASTRODUODENOSCOPY (EGD) WITH PROPOFOL N/A 03/07/2018   Procedure: ESOPHAGOGASTRODUODENOSCOPY (EGD) WITH PROPOFOL;  Surgeon: Manya Silvas, MD;  Location[facility];  Service: Endoscopy;  Laterality: N/A;   FRACTURE SURGERY     HARDWARE REMOVAL Left 06/18/2020   Procedure: HARDWARE REMOVAL;  Surgeon: Lovell Sheehan, MD;  Location: [facility];  Service: Orthopedics;  Laterality: Left;   HUMERUS IM NAIL Left 12/31/2019   Procedure: INTRAMEDULLARY (IM) NAIL HUMERAL;  Surgeon: Lovell Sheehan, MD;  Location: [facility];  Service: Orthopedics;  Laterality: Left;   LIPOSUCTION Right 05/11/2016   Procedure: LIPOSUCTION;  Surgeon: Irene Limbo, MD;  Location: [place];  Service: Plastics;  Laterality: Right;   LIVER BIOPSY  2011   MASTECTOMY SUBCUTANEOUS Bilateral 1990   MASTOPEXY Bilateral 05/11/2016   Procedure: MASTOPEXY BILATERAL ;  Surgeon: Irene Limbo, MD;  Location: [place];  Service: Plastics;  Laterality: Bilateral;   Social History:  reports that she quit smoking about 17 years ago. Her smoking use included cigarettes. She has a 40.00 pack-year smoking history. She has never used smokeless tobacco. She reports that she does not  currently use alcohol after a past usage of about 2.0 standard drinks of alcohol per week. She reports that she does not use drugs.  Allergies  Allergen Reactions   Ciprofloxacin Nausea And Vomiting and Hives   Codeine Anaphylaxis and Hives    Reaction:  Unknown    Demerol [Meperidine] Anaphylaxis and Hives    Reaction:  Unknown     Fluconazole Hives   Latex Anaphylaxis and Rash   Morphine And Related Hives   Doxycycline Nausea And Vomiting   Flagyl [Metronidazole] Nausea And Vomiting    Reaction:  Unknown    Influenza Vaccines Swelling    Localized swelling   Tape Other (See Comments)    Pt allergic to Adhesive tape and latex.(  Skin breaks out)   Valtrex [Valacyclovir Hcl] Hives   Buprenorphine     Other reaction(s): Other (see comments) unknown   Duloxetine Hcl Other (See Comments)    Reaction:  Unknown  Tramadol     Other reaction(s): Not available   Amoxicillin-Pot Clavulanate Nausea And Vomiting   Sulfamethoxazole-Trimethoprim Nausea And Vomiting   Valacyclovir Rash    Family History  Problem Relation Age of Onset   Atrial fibrillation Mother    Atrial fibrillation Sister    Cancer Sister    Diabetes Sister    Breast cancer Sister 53   Diabetes Father    Cancer Sister    Diabetes Sister    Atrial fibrillation Sister    Kidney disease Maternal Aunt     Prior to Admission medications   Medication Sig Start Date End Date Taking? Authorizing Provider  ALPRAZolam Duanne Moron) 1 MG tablet Take 2 mg by mouth 2 (two) times daily. 08/04/22   [provider]  alum & mag hydroxide-simeth (MAALOX/MYLANTA) 200-200-20 MG/5ML suspension Take 15 mLs by mouth every 6 (six) hours as needed for indigestion or heartburn.    [provider]  aspirin EC 81 MG tablet Take 1 tablet (81 mg total) by mouth daily. Swallow whole. 05/05/22   Wyvonnia Dusky, MD  butorphanol (STADOL) 10 MG/ML nasal spray Place 1 spray into the nose every 4 (four) hours as needed for  headache. 10/22/16   Kathrine Haddock, NP  calcium carbonate (TUMS - DOSED IN MG ELEMENTAL CALCIUM) 500 MG chewable tablet Chew 1 tablet by mouth 3 (three) times daily as needed for indigestion or heartburn.    [provider]  Cyanocobalamin (VITAMIN B-12 IJ) Inject 1,000 mg as directed every 30 (thirty) days.    [provider]  cyclobenzaprine (FLEXERIL) 10 MG tablet Take 10 mg by mouth 3 (three) times daily. 07/23/22   [provider]  rosuvastatin (CRESTOR) 10 MG tablet Take 10 mg by mouth daily. 02/24/22   [provider]  Vitamin D, Ergocalciferol, 50000 units CAPS Take 1 capsule by mouth once a week. 07/23/22   [provider]  zolpidem (AMBIEN) 10 MG tablet Take 1 tablet (10 mg total) by mouth at bedtime for 1 day. 05/04/22 08/09/22  Wyvonnia Dusky, MD    Physical Exam: Vitals:   10/06/22 1644 10/06/22 1730 10/06/22 1742 10/06/22 1904  BP:  132/69  (!) 156/69  Pulse: 83 75 76 74  Resp: '19 17 18 16  '$ Temp: 97.9 F (36.6 C) 97.9 F (36.6 C) 97.9 F (36.6 C) 97.9 F (36.6 C)  TempSrc:      SpO2:    98%  Weight:      Height:       Physical Exam Vitals and nursing note reviewed.  Constitutional:      General: She is not in acute distress.    Appearance: She is normal weight.  HENT:     Head: Normocephalic.     Mouth/Throat:     Comments: Oropharynx very dry Eyes:     Extraocular Movements: Extraocular movements intact.     Conjunctiva/sclera: Conjunctivae normal.     Pupils: Pupils are equal, round, and reactive to light.  Cardiovascular:     Rate and Rhythm: Normal rate and regular rhythm.     Heart sounds: No murmur heard.    No gallop.  Pulmonary:     Effort: Pulmonary effort is normal. No respiratory distress.     Breath sounds: Normal breath sounds. No wheezing, rhonchi or rales.     Comments: Poor inspiratory effort Abdominal:     General: Bowel sounds are normal. There is no distension.     Palpations: Abdomen is  soft.     Tenderness: There is no abdominal tenderness. There is no guarding.  Musculoskeletal:     Cervical back: Neck supple.     Right lower leg: Edema (+1 pitting edema) present.     Left lower leg: Edema (+1 pitting edema) present.  Skin:    General: Skin is warm and dry.  Neurological:     Mental Status: She is easily aroused. She is lethargic.     Cranial Nerves: Dysarthria present.     Comments:  Patient alert to name, place and time  Able to move all extremities independently, however right shoulder weakness noted secondary to pain.  Difficulty following instructions consistently and occasionally will doze off but easily wake to voice.  Psychiatric:     Comments: Poor attention however does not appear to be responding to any external stimuli    Data Reviewed: CMP with no remarkable abnormalities.  CMP with potassium of 3.1, glucose of 122, calcium of 8.7, albumin of 3.4, AST of 50, alkaline phosphatase of 638, total bili of 1.3, GFR 58.  Lactic acid within normal limits.  PT/INR within normal limits.  PTT within normal limits.  Troponin negative. BNP elevated at 294. Urinalysis with mild proteinuria.   EKG personally reviewed. Sinus rhythm. RBBB. Qtc prolonged.   DG Ribs Unilateral Right  Result Date: 10/06/2022 CLINICAL DATA:  [place], right rib fractures EXAM: RIGHT RIBS - 2 VIEW COMPARISON:  Shoulder x-ray 10/06/2022, chest x-ray 10/06/2022 FINDINGS: Frontal and oblique views of the right thoracic cage are obtained. Right breast prosthesis obscures portions of the underlying ribs. Right chest is clear without airspace disease, effusion, or pneumothorax. There are chronic healed right posterior eighth and ninth rib fractures. 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 There are no acute displaced fractures. IMPRESSION: 1. Multiple chronic right rib fractures as above. No acute bony abnormality. Electronically Signed   By: Randa Ngo M.D.   On:  10/06/2022 19:47   MR BRAIN WO CONTRAST  Result Date: 10/06/2022 CLINICAL DATA:  Mental status change.  Fall today. EXAM: MRI HEAD WITHOUT CONTRAST TECHNIQUE: Multiplanar, multiecho pulse sequences of the brain and surrounding structures were obtained without intravenous contrast. COMPARISON:  CT head 10/06/2022 FINDINGS: Brain: Negative for acute infarct. Generalized mild atrophy. Chronic microvascular ischemic changes in the white matter. Pituitary overall normal in size. 5 mm T1 hyperintensity in the posterior pituitary best seen on sagittal T1 image 13. No suprasellar extension. Vascular: Normal arterial flow voids Skull and upper cervical spine: No focal skeletal lesion. Sinuses/Orbits: Mucosal edema paranasal sinuses. Air-fluid level right maxillary sinus. Bilateral cataract extraction Other: None IMPRESSION: 1. Negative for acute infarct. 2. Atrophy and chronic microvascular ischemic change in the white matter. 3. 5 mm T1 hyperintensity in the posterior pituitary. Possible Rathke's cleft cyst or pituitary adenoma. Slight enlargement since the prior MRI of 04/28/2022 Electronically Signed   By: Franchot Gallo M.D.   On: 10/06/2022 18:57   DG Shoulder Right  Result Date: 10/06/2022 CLINICAL DATA:  Right shoulder pain EXAM: RIGHT SHOULDER - 2+ VIEW COMPARISON:  10/01/2014 FINDINGS: Healed fracture proximal right humerus. No acute fracture. Normal alignment. Multiple right rib fractures of indeterminate age. Recommend rib series of symptomatic. IMPRESSION: 1. No acute fracture of the shoulder. 2. Multiple right rib fractures of indeterminate age. Electronically Signed   By: Franchot Gallo M.D.   On: 10/06/2022 17:44   CT Head Wo Contrast  Result Date: 10/06/2022 CLINICAL DATA:  Acute  stroke suspected EXAM: CT HEAD WITHOUT CONTRAST TECHNIQUE: Contiguous axial images were obtained from the base of the skull through the vertex without intravenous contrast. RADIATION DOSE REDUCTION: This exam was  performed according to the departmental dose-optimization program which includes automated exposure control, adjustment of the mA and/or kV according to patient size and/or use of iterative reconstruction technique. COMPARISON:  CT Head 08/09/22 FINDINGS: Brain: No evidence of acute infarction, hemorrhage, hydrocephalus, extra-axial collection or mass lesion/mass effect. Vascular: No hyperdense vessel or unexpected calcification. Skull: Normal. Negative for fracture or focal lesion. Soft tissue swelling over the left parietal scalp Sinuses/Orbits: Bilateral lens replacement.Frothy secretions right maxillary sinus. Other: None. IMPRESSION: 1. No acute intracranial abnormality. 2. Soft tissue swelling over the left parietal scalp. No underlying fracture. 3. Frothy secretions right maxillary sinus. Correlate for acute sinusitis. Electronically Signed   By: Marin Roberts M.D.   On: 10/06/2022 15:51   DG Chest Port 1 View  Result Date: 10/06/2022 CLINICAL DATA:  Question sepsis.  Fell today. EXAM: PORTABLE CHEST 1 VIEW COMPARISON:  08/09/2022 FINDINGS: Artifact overlies the chest. Density related to bilateral breast implants. The right chest appears clear allowing for that. Abnormal density in the left lower chest could be due to left lower lobe infiltrate/collapse/effusion. Old healed left humeral fracture. IMPRESSION: Abnormal density in the left lower chest that could be due to left lower lobe infiltrate/collapse/effusion. Electronically Signed   By: Nelson Chimes M.D.   On: 10/06/2022 14:03     Results are pending, will review when available.  Assessment and Plan: * Acute encephalopathy Patient presenting with 1 day history of encephalopathy with ground level fall.  Unclear if there was a syncopal episode.  Per chart review, patient has had similar previous admissions for both falls and syncope.  CT head and MRI obtained with no evidence of acute ischemia.  No electrolyte abnormalities to explain  presentation.  Although patient's LFTs are elevated, she has no known history of cirrhosis or alcohol use.  Previous EEG obtained in the setting of syncope was negative.  Previous echocardiogram obtained in the setting of syncope was negative as well.   Differential includes TIA versus polypharmacy (patient is on opioid, benzo and Ambien).  Given persistent confusion since arriving to the ED, differential also includes seizure, however patient has no prior history of seizures. Low suspicion for infection at this time, so we will discontinue antibiotics ordered in the ED.  - Neurology consulted; appreciate their recommendations - Continue supportive management with IV fluids - Frequent neurochecks - B12 ordered - Given persistent lethargy, will administer half dose of her home Xanax and decreased frequency of home butorphanol.  Can increase back to home doses when mentation improves. - Holding home Flexeril and Ambien  Ground-level fall CT with evidence of subcutaneous edema but no hematoma noted.  Chest x-ray with age-indeterminate rib fractures.  Rib studies obtained and fractures appear chronic.  -PT/OT evaluation prior to discharge  Lymphedema Patient has a long-term history of bilateral lymphedema, however on examination, her left calf appears larger than her right.  Patient states she has a history of DVT.  - Holding home torsemide given hypovolemia on examination - Bilateral venous Dopplers of lower extremities - If Dopplers are negative, consider ordering Unna Boots  Elevated LFTs Per chart review, patient has a history of elevated alkaline phosphatase dating back to 2014 with new elevation in AST dating back to August 13, 2022 that is improving.  No known alcohol use.   - GGT  ordered - Repeat CMP in the a.m. - Acute hepatitis panel ordered, although suspicion is low - Consider right upper quadrant ultrasound tomorrow morning   Advance Care Planning:   Code Status: DNR.  When  I brought up the topic of CODE STATUS with Ms. Clowers, she immediately states DNR.  She is alert and oriented x3 and although she has fluctuating attention and is not back to baseline, she is able to express that when "it is my time to go, it is my time to go."  She expresses understanding of what DNR/DNI means.  Per chart review, patient has expressed similar wishes in the past when mentation is at baseline as well.  Consults: Neurology  Family Communication: Patient's niece updated.   Severity of Illness: The appropriate patient status for this patient is OBSERVATION. Observation status is judged to be reasonable and necessary in order to provide the required intensity of service to ensure the patient's safety. The patient's presenting symptoms, physical exam findings, and initial radiographic and laboratory data in the context of their medical condition is felt to place them at decreased risk for further clinical deterioration. Furthermore, it is anticipated that the patient will be medically stable for discharge from the hospital within 2 midnights of admission.   Author: Jose Persia, MD 10/06/2022 9:44 PM  For on call review [email or web address].

## 2022-10-06 NOTE — ED Triage Notes (Signed)
Pt present from home via [facility] s/p an unwitnessed fall this am around 0730 in the bathroom. EMS reports when they found her she was in the living room. Pt denies hitting head, no LOC, no thinners reported. Family expressed concern for a difference in her speech. EMS reports NIH 0. Pt speech difficult to understand by staff. LSN was 1930 10/24. PERRLA. AO4/GCS14  Skin tear noted to L wrist, no other deformities noted.   Mumma to bedside at this time.

## 2022-10-06 NOTE — ED Provider Notes (Signed)
[facility] Provider Note    Event Date/Time   First MD Initiated Contact with Patient 10/06/22 1317     (approximate)   History   Fall   HPI  Donna Evans is a 76 y.o. female with past medical history significant for hypertension, prior CVA (unknown deficits), GERD, breast cancer status postmastectomy, presents to the emergency department after being found down.  History is provided by the patient's niece Mickel Baas over the telephone.  States that she went to check on her today like she does every week and she was unable to get a hold of her because she did not answer the phone or the front door.  Her and her other friend found her laying on the ground.  She had an unwitnessed fall.  States that she was confused and they had difficulty understanding her speech.  They state that normally she is able to walk around and take care of herself.  She does need some assistance with others.  Last known normal was yesterday around 9 PM when her friend Lavone Neri talk to her over the phone.  Patient denies any chest pain.  She does not recall the events of the fall.  Not on anticoagulation.      Physical Exam   Triage Vital Signs: ED Triage Vitals [10/06/22 1311]  Enc Vitals Group     BP (!) 168/91     Pulse Rate 74     Resp 19     Temp      Temp src      SpO2 98 %     Weight      Height      Head Circumference      Peak Flow      Pain Score      Pain Loc      Pain Edu?      Excl. in [place]?     Most recent vital signs: Vitals:   10/06/22 1600 10/06/22 1609  BP:    Pulse: 79 82  Resp: 18 18  Temp: (!) 96.7 F (35.9 C) (!) 96.8 F (36 C)  SpO2:      Physical Exam Constitutional:      Appearance: She is well-developed. She is ill-appearing.  HENT:     Head: Atraumatic.  Eyes:     Conjunctiva/sclera: Conjunctivae normal.  Cardiovascular:     Rate and Rhythm: Regular rhythm.  Pulmonary:     Effort: No respiratory distress.  Abdominal:     General:  There is no distension.  Musculoskeletal:        General: Normal range of motion.     Cervical back: Normal range of motion.  Skin:    General: Skin is warm.     Capillary Refill: Capillary refill takes 2 to 3 seconds.  Neurological:     Mental Status: She is alert. She is disoriented.     Motor: Weakness present.     Comments: Slurred speech.  Able to hold bilateral upper and lower extremities to gravity.  No pronator drift.          IMPRESSION / MDM / ASSESSMENT AND PLAN / ED COURSE  I reviewed the triage vital signs and the nursing notes.  76 year old female with multiple comorbidities who presents to the emergency department after being found down.  Last known normal 9 PM yesterday.  Not on anticoagulation.  On arrival patient was found to be hypothermic.  No tachycardia or tachypnea.  Normal  blood pressures.  Given her hypothermia and unknown etiology of her altered mental status sepsis order set was used and blood in your urine cultures were obtained.  Chest x-ray with findings concerning for possible developing left lower lobe pneumonia.  Possible aspiration pneumonia.  Was started on CAP coverage.  Differential diagnosis including infectious process, CVA, dysrhythmia, ACS, PE.  Patient is outside of the window of TNK.  Patient is not a thrombectomy candidate given NIH scale that is less than 6.  Does not have symptoms that are consistent with a large vessel cutoff.     EKG  My interpretation of the EKG -normal sinus rhythm.  Normal intervals.  No chamber enlargement.  No significant ST elevation or depression.  No signs of acute ischemia or dysrhythmia.  No tachycardic or bradycardic dysrhythmias while on cardiac telemetry.  RADIOLOGY I independently reviewed imaging, my interpretation of imaging: CT scan of the head shows no signs of intracranial hemorrhage or infarction.  Chest x-ray with questionable left lower lobe developing pneumonia.    ED Results /  Procedures / Treatments   Labs (all labs ordered are listed, but only abnormal results are displayed) Labs interpreted as -   Mildly elevated troponin.  UA without signs of urinary tract infection.  Creatinine appears to be at her baseline.  No significant electrolyte abnormalities.  CT head without obvious findings of any acute intracranial hemorrhage.  Outside of the window of TNK.  Concern for possible CVA, given rectal aspirin given no signs of intracranial hemorrhage.  Consulted the hospitalist who evaluated the patient in the emergency department.  Patient admitted for altered mental status.  Labs Reviewed  COMPREHENSIVE METABOLIC PANEL - Abnormal; Notable for the following components:      Result Value   Potassium 3.1 (*)    Glucose, Bld 122 (*)    Calcium 8.7 (*)    Albumin 3.4 (*)    AST 50 (*)    Alkaline Phosphatase 638 (*)    Total Bilirubin 1.3 (*)    GFR, Estimated 58 (*)    All other components within normal limits  URINALYSIS, COMPLETE (UACMP) WITH MICROSCOPIC - Abnormal; Notable for the following components:   Color, Urine YELLOW (*)    APPearance CLEAR (*)    Protein, ur 30 (*)    All other components within normal limits  BRAIN NATRIURETIC PEPTIDE - Abnormal; Notable for the following components:   B Natriuretic Peptide 294.1 (*)    All other components within normal limits  CBG MONITORING, ED - Abnormal; Notable for the following components:   Glucose-Capillary 108 (*)    All other components within normal limits  CULTURE, BLOOD (ROUTINE X 2)  CULTURE, BLOOD (ROUTINE X 2)  URINE CULTURE  RESP PANEL BY RT-PCR (FLU A&B, COVID) ARPGX2  LACTIC ACID, PLASMA  CBC WITH DIFFERENTIAL/PLATELET  PROTIME-INR  APTT  CK  TROPONIN I (HIGH SENSITIVITY)       PROCEDURES:  Critical Care performed: No  Procedures  Patient's presentation is most consistent with acute presentation with potential threat to life or bodily function.   MEDICATIONS ORDERED IN  ED: Medications  cefTRIAXone (ROCEPHIN) 1 g in sodium chloride 0.9 % 100 mL IVPB (1 g Intravenous New Bag/Given 10/06/22 1607)  azithromycin (ZITHROMAX) 500 mg in sodium chloride 0.9 % 250 mL IVPB (has no administration in time range)  aspirin suppository 300 mg (has no administration in time range)  sodium chloride 0.9 % bolus 1,000 mL (0 mLs Intravenous Stopped 10/06/22  1508)    FINAL CLINICAL IMPRESSION(S) / ED DIAGNOSES   Final diagnoses:  Fall, initial encounter  Injury of head, initial encounter  Community acquired pneumonia of left lower lobe of lung  Altered mental status, unspecified altered mental status type     Rx / DC Orders   ED Discharge Orders     None        Note:  This document was prepared using Dragon voice recognition software and may include unintentional dictation errors.   Nathaniel Man, MD 10/06/22 1620

## 2022-10-06 NOTE — Assessment & Plan Note (Addendum)
Patient presenting with 1 day history of encephalopathy with ground level fall.  Unclear if there was a syncopal episode.  Per chart review, patient has had similar previous admissions for both falls and syncope.  CT head and MRI obtained with no evidence of acute ischemia.  No electrolyte abnormalities to explain presentation.  Although patient's LFTs are elevated, she has no known history of cirrhosis or alcohol use.  Previous EEG obtained in the setting of syncope was negative.  Previous echocardiogram obtained in the setting of syncope was negative as well.   Differential includes TIA versus polypharmacy (patient is on opioid, benzo and Ambien).  Given persistent confusion since arriving to the ED, differential also includes seizure, however patient has no prior history of seizures. Low suspicion for infection at this time, so we will discontinue antibiotics ordered in the ED.  - Neurology consulted; appreciate their recommendations - Continue supportive management with IV fluids - Frequent neurochecks - B12 ordered - Given persistent lethargy, will administer half dose of her home Xanax and decreased frequency of home butorphanol.  Can increase back to home doses when mentation improves. - Holding home Flexeril and Ambien - N.p.o. pending swallow study

## 2022-10-06 NOTE — ED Notes (Signed)
Bair hugger removed at this time

## 2022-10-06 NOTE — Assessment & Plan Note (Signed)
Per chart review, patient has a history of elevated alkaline phosphatase dating back to 2014 with new elevation in AST dating back to August 13, 2022 that is improving.  No known alcohol use.   - GGT ordered - Repeat CMP in the a.m. - Acute hepatitis panel ordered, although suspicion is low - Consider right upper quadrant ultrasound tomorrow morning

## 2022-10-06 NOTE — Consult Note (Signed)
Neurology Consultation Reason for Consult: Altered mental status Requesting Physician: Doristine Mango  CC:   History is obtained from: Patient, chart review, long-time friend Hal at bedside and grand-niece Mickel Baas at bedside   HPI: Donna Evans is a 76 y.o. female with a past medical history significant for reported systemic lupus erythematous, remote right breast cancer (1998), chronic pain and fibromyalgia on Flexeril, anxiety on chronic Xanax (takes nightly plus additional as needed), transient global amnesia, migraine headaches.  Over the past year she has been having frequent falls and presented for an unwitnessed fall on 10/25 with confusion.  She was last seen well by family on the evening of 10/24 around 7:30 PM.  At 7:30 AM family had come to check on her and found her on the ground with knocked over lamp and table.  She was shivering and hypothermic with "thicker" speech than normal and a glazed appearance to her eyes.  Per primary team she was initially appropriate on their first conversation but later in the afternoon was tangential and hallucinating. Per family she was responding to family members who have passed away, such as her husband who died 39 year ago.   On review of records she had 2 prior presentations with right-sided weakness which appeared nonorganic per neurology notes (11/20/2013, 09/30/2016)  I cannot find any details of her diagnosis of lupus.  Family does not think she follows with a rheumatologist.  She is not on any immunosuppressive treatment.  Patient and family are vague in what the diagnosis exactly was stating it was "lupus-like."  They do think she had a positive ANA, but denies any malar rash, but notes that she was told to avoid the sun due to medication she was on and not due to her lupus.   At baseline she lives independently and still drives short distances and has not gotten lost.  Initially her friend at bedside reports she is manages her finances  herself but the great niece mentions that another family member helps manage most of her finances and has done so since the patient's husband passed away about 6 years ago Ronalee Belts who reportedly also has medical power of attorney)  They note that she has had a marked decline over the past year with increasing falls.  When asked about her notable temporal wasting, niece reports that her weight has been stable for the past year or so since she has been more heavily involved  Regarding sedating medication use, the patient uses Xanax which is written for 2 mg twice daily and she reports she has used this since her only child passed away many years ago; she uses it at least nightly with occasional extra doses.  She uses Flexeril for her chronic pain and typically takes this every night as well with additional extra doses.  She reports to me that she has been taking it 3 times a day but more recently.  She additionally takes Ambien 10 mg every night, and reports drinking "2 shots on average every night" but then is vague when I ask how many drinks she has at most on any given night.  Her most recent admission was for AKI felt to be secondary to dehydration.  Regarding prior work-up of her LFTs, she did have a liver biopsy done for which the pathology reports the specimen was essentially nondiagnostic  She did see Dr. Melrose Nakayama 03/04/2021 for frequent falls, felt to be sensory ataxia, with EMG nerve conduction study reporting severe sensory polyneuropathy.  At  that time her Mini-Mental status evaluation was 30/30  ROS: Limited by patient's mild confusion  Past Medical History:  Diagnosis Date   Allergic state    Anginal pain ([place])    Prinzmetal's angina   Anxiety    Arthritis    osteoarthritis   Breast cancer ([place]) 1990   right breast cancer   Chronic pain    Chronic pain    COPD (chronic obstructive pulmonary disease) (HCC)    Depression    Edema    Fibromyalgia    Foot fracture    Bilateral   GERD  (gastroesophageal reflux disease)    History of kidney stones    Hypertension    Leg fracture, right    Low back pain    Lumbosacral neuritis    Medulloadrenal hyperfunction (HCC)    Migraine headache    Peripheral neuropathy    Shoulder fracture, right    Stroke ([place])    TIA   Systemic lupus erythematosus ([place])    Thyroid disease    TIA (transient ischemic attack) 11/28/2013   Transient global amnesia 2011   Past Surgical History:  Procedure Laterality Date   ABDOMINAL HYSTERECTOMY  1981   APPENDECTOMY  1957   AUGMENTATION MAMMAPLASTY Bilateral 1990   saline submuscular   BACK SURGERY  1989   BILATERAL TOTAL MASTECTOMY WITH AXILLARY LYMPH NODE DISSECTION  1990   BREAST IMPLANT EXCHANGE Bilateral 05/11/2016   Procedure: REMOVAL AND REPLACEMENT OF BREAST IMPLANTS ;  Surgeon: Irene Limbo, MD;  Location: [place];  Service: Plastics;  Laterality: Bilateral;   CAPSULECTOMY Bilateral 05/11/2016   Procedure: CAPSULECTOMY CAPSULORRAPHY ;  Surgeon: Irene Limbo, MD;  Location: [place];  Service: Plastics;  Laterality: Bilateral;   CATARACT EXTRACTION W/PHACO Right 12/26/2018   Procedure: CATARACT EXTRACTION PHACO AND INTRAOCULAR LENS PLACEMENT ([place]) RIGHT;  Surgeon: Birder Robson, MD;  Location: [facility];  Service: Ophthalmology;  Laterality: Right;  [place]  00:46 CDE 8.50 Fluid pack lot # [id number]   CATARACT EXTRACTION W/PHACO Left 01/23/2019   Procedure: CATARACT EXTRACTION PHACO AND INTRAOCULAR LENS PLACEMENT ([place]) LEFT;  Surgeon: Birder Robson, MD;  Location: [facility];  Service: Ophthalmology;  Laterality: Left;  [place]  00:45 CDE  8.41 fluid pack lot # [id number]   CHOLECYSTECTOMY  1979   COLONOSCOPY WITH PROPOFOL N/A 01/25/2018   Procedure: COLONOSCOPY WITH PROPOFOL;  Surgeon: Manya Silvas, MD;  Location[facility]COPY;  Service: Endoscopy;  Laterality: N/A;   COPD     ESOPHAGOGASTRODUODENOSCOPY (EGD) WITH PROPOFOL N/A 03/07/2018    Procedure: ESOPHAGOGASTRODUODENOSCOPY (EGD) WITH PROPOFOL;  Surgeon: Manya Silvas, MD;  Location: [facility] ENDOSCOPY;  Service: Endoscopy;  Laterality: N/A;   FRACTURE SURGERY     HARDWARE REMOVAL Left 06/18/2020   Procedure: HARDWARE REMOVAL;  Surgeon: Lovell Sheehan, MD;  Location: [facility];  Service: Orthopedics;  Laterality: Left;   HUMERUS IM NAIL Left 12/31/2019   Procedure: INTRAMEDULLARY (IM) NAIL HUMERAL;  Surgeon: Lovell Sheehan, MD;  Location: [facility];  Service: Orthopedics;  Laterality: Left;   LIPOSUCTION Right 05/11/2016   Procedure: LIPOSUCTION;  Surgeon: Irene Limbo, MD;  Location: [place];  Service: Plastics;  Laterality: Right;   LIVER BIOPSY  2011   MASTECTOMY SUBCUTANEOUS Bilateral 1990   MASTOPEXY Bilateral 05/11/2016   Procedure: MASTOPEXY BILATERAL ;  Surgeon: Irene Limbo, MD;  Location: [place];  Service: Plastics;  Laterality: Bilateral;   Current Outpatient Medications  Medication Instructions  ALPRAZolam (XANAX) 2 mg, Oral, 2 times daily   alum & mag hydroxide-simeth (MAALOX/MYLANTA) 200-200-20 MG/5ML suspension 15 mLs, Oral, Every 6 hours PRN   aspirin EC 81 mg, Oral, Daily, Swallow whole.   butorphanol (STADOL) 10 MG/ML nasal spray 1 spray, Nasal, Every 4 hours PRN   calcium carbonate (TUMS - DOSED IN MG ELEMENTAL CALCIUM) 500 MG chewable tablet 1 tablet, Oral, 3 times daily PRN   Cyanocobalamin (VITAMIN B-12 IJ) 1,000 mg, Injection, Every 30 days   cyclobenzaprine (FLEXERIL) 10 mg, Oral, 3 times daily   hydrochlorothiazide (MICROZIDE) 12.5 mg, Daily   rosuvastatin (CRESTOR) 10 mg, Oral, Daily   torsemide (DEMADEX) 20 mg, Daily PRN   Vitamin D, Ergocalciferol, 50000 units CAPS 1 capsule, Oral, Weekly   zolpidem (AMBIEN) 10 mg, Oral, Daily at bedtime     Family History  Problem Relation Age of Onset   Atrial fibrillation Mother    Atrial fibrillation Sister    Cancer Sister    Diabetes Sister    Breast  cancer Sister 54   Diabetes Father    Cancer Sister    Diabetes Sister    Atrial fibrillation Sister    Kidney disease Maternal Aunt     Social History:  reports that she quit smoking about 17 years ago. Her smoking use included cigarettes. She has a 40.00 pack-year smoking history. She has never used smokeless tobacco. She reports that she does not currently use alcohol after a past usage of about 2.0 standard drinks of alcohol per week. She reports that she does not use drugs.   Exam: Current vital signs: BP (!) 156/69 (BP Location: Right Arm)   Pulse 74   Temp 97.9 F (36.6 C)   Resp 16   Ht $R'5\' 5"'yb$  (1.651 m)   Wt 64 kg   SpO2 98%   BMI 23.48 kg/m  Vital signs in last 24 hours: Temp:  [95 F (35 C)-97.9 F (36.6 C)] 97.9 F (36.6 C) (10/25 1904) Pulse Rate:  [67-84] 74 (10/25 1904) Resp:  [14-20] 16 (10/25 1904) BP: (105-185)/(56-93) 156/69 (10/25 1904) SpO2:  [98 %] 98 % (10/25 1904) Weight:  [64 kg] 64 kg (10/25 1312)   Physical Exam  Constitutional: Appears frail Psych: Affect pleasant, cooperative, tangential Eyes: No scleral injection HENT: No oropharyngeal obstruction.  MSK: no joint deformities.  Cardiovascular: Normal rate and regular rhythm.  Perfusing extremities well Respiratory: Effort normal, non-labored breathing, nasal cannula in place GI: Soft.  No distension. There is no tenderness.  Skin: Scattered bruising.  No clear pathognomic rash.  Significant edema of the bilateral lower extremities with purplish discoloration that the family reports is her Raynaud's  Neuro: Mental Status: Patient is awake, alert, oriented to person, place, month, year, and situation however she gives inconsistent answers to some questions with somewhat poor attention/concentration No signs of aphasia or neglect Cranial Nerves: II: Visual Fields are full. Pupils are equal, round, and reactive to light.   III,IV, VI: EOMI without ptosis or diploplia.  V: Facial sensation is  symmetric to light touch VII: Facial movement is symmetric.  VIII: hearing is somewhat hard of hearing X: Uvula elevates symmetrically XI: Shoulder shrug is symmetric. XII: tongue is midline without atrophy or fasciculations.  Motor: Tone is normal. Bulk is normal. 5/5 strength was present in all four extremities, other than mild 4+ weakness in the bilateral deltoids and 3/5 bilateral hip flexion, possible mild weakness in left foot dorsiflexion versus poor attention. Sensory: Sensation is symmetric  to light touch and in the arms and legs. Deep Tendon Reflexes: 2+ and symmetric in the brachioradialis and patellae.  Cerebellar: FNF and HKS are intact bilaterally Gait:  Deferred    I have reviewed labs in epic and the results pertinent to this consultation are:  Basic Metabolic Panel: Recent Labs  Lab 10/06/22 1322 10/07/22 0450  NA 143 145  K 3.1* 3.3*  CL 107 114*  CO2 28 24  GLUCOSE 122* 74  BUN 21 19  CREATININE 1.00 1.02*  CALCIUM 8.7* 7.9*   Lab Results  Component Value Date   ALT 20 10/07/2022   AST 42 (H) 10/07/2022   GGT 153 (H) 10/06/2022   ALKPHOS 468 (H) 10/07/2022   BILITOT 1.2 10/07/2022   Improving from admission alk phos of 638, T. bili of 1.3, AST 50 and ALT 25  GGT 153  CK 105  CBC: Recent Labs  Lab 10/06/22 1322 10/07/22 0450  WBC 7.0 6.4  NEUTROABS 5.2  --   HGB 12.7 9.8*  HCT 40.4 30.3*  MCV 94.6 93.5  PLT 219 187    Coagulation Studies: Recent Labs    10/06/22 1322 10/07/22 0450  LABPROT 12.6 14.8  INR 1.0 1.2    Lab Results  Component Value Date   TSH 1.879 10/01/2016   B12 04/21/2022 241 (low), this admission low normal Lab Results  Component Value Date   VITAMINB12 414 10/06/2022    Ammonia 24,  RPR, HIV neg  I have reviewed the images obtained:  MRI brain personally reviewed, agree with radiology:   1. Negative for acute infarct. 2. Atrophy and chronic microvascular ischemic change in the white matter. 3. 5 mm  T1 hyperintensity in the posterior pituitary. Possible Rathke's cleft cyst or pituitary adenoma. Slight enlargement since the prior MRI of 04/28/2022  EEG: normal  Impression: Waxing and waning mental status / hallucinations most commonly seen in delirium. Patient has multiple risk factors (hospitalization, polypharmacy, baseline c/f cognitive impairment, hx of recent b12 deficiency). Query baseline cognitive impairment / developing dementia including potentially alcohol/nutritional related dementia. However, given the history of possible lupus will evaluate for autoimmune etiology of her neuropsychiatric presentation. Unclear etiology of her LFT elevations; AST/ALT is in the typical alcohol related ratio but typically Alk Phos elevations are not seen with this  Recommendations: -Appreciate primary team work-up of elevated alk phos and T. bili with lower elevations of AST/ALT -pending MMA, thiamine, for reversible causes work-up -Increase empiric thiamine supplementation to 500 mg TID (ordered) -B12 supplementation at 1000 mcg daily (ordered) -Reduce polypharmacy, taper alcohol use to a stop  -Reduce xanax from 0.5 mg BID scheduled to QHS scheduled ([place]) -SPEP, ANA, dsDNA, lupus anticoagulant panel, antiphospholipid, anticardiolipin, beta-2-glycoprotein, c3, c4, ch50, ESR, CRP, urine protein/cr ratio, (ordered) -Neurology will continue to follow   Lesleigh Noe MD-PhD [facility] [phone] [facility] coverage for [facility] is from 8 AM to 4 AM in-house and 4 PM to 8 PM by telephone/video. 8 PM to 8 AM emergent questions or overnight urgent questions should be addressed to Teleneurology On-call or [facility] neurohospitalist; contact information can be found on AMION

## 2022-10-07 ENCOUNTER — Observation Stay: Payer: PPO

## 2022-10-07 DIAGNOSIS — R600 Localized edema: Secondary | ICD-10-CM

## 2022-10-07 DIAGNOSIS — G9340 Encephalopathy, unspecified: Secondary | ICD-10-CM | POA: Diagnosis not present

## 2022-10-07 DIAGNOSIS — R4182 Altered mental status, unspecified: Secondary | ICD-10-CM

## 2022-10-07 DIAGNOSIS — S0990XA Unspecified injury of head, initial encounter: Secondary | ICD-10-CM

## 2022-10-07 DIAGNOSIS — R55 Syncope and collapse: Secondary | ICD-10-CM | POA: Diagnosis not present

## 2022-10-07 DIAGNOSIS — J189 Pneumonia, unspecified organism: Secondary | ICD-10-CM | POA: Diagnosis not present

## 2022-10-07 DIAGNOSIS — W19XXXA Unspecified fall, initial encounter: Secondary | ICD-10-CM

## 2022-10-07 DIAGNOSIS — R748 Abnormal levels of other serum enzymes: Secondary | ICD-10-CM | POA: Diagnosis not present

## 2022-10-07 LAB — CBC
HCT: 30.3 % — ABNORMAL LOW (ref 36.0–46.0)
Hemoglobin: 9.8 g/dL — ABNORMAL LOW (ref 12.0–15.0)
MCH: 30.2 pg (ref 26.0–34.0)
MCHC: 32.3 g/dL (ref 30.0–36.0)
MCV: 93.5 fL (ref 80.0–100.0)
Platelets: 187 10*3/uL (ref 150–400)
RBC: 3.24 MIL/uL — ABNORMAL LOW (ref 3.87–5.11)
RDW: 14.6 % (ref 11.5–15.5)
WBC: 6.4 10*3/uL (ref 4.0–10.5)
nRBC: 0 % (ref 0.0–0.2)

## 2022-10-07 LAB — COMPREHENSIVE METABOLIC PANEL
ALT: 20 U/L (ref 0–44)
AST: 42 U/L — ABNORMAL HIGH (ref 15–41)
Albumin: 2.5 g/dL — ABNORMAL LOW (ref 3.5–5.0)
Alkaline Phosphatase: 468 U/L — ABNORMAL HIGH (ref 38–126)
Anion gap: 7 (ref 5–15)
BUN: 19 mg/dL (ref 8–23)
CO2: 24 mmol/L (ref 22–32)
Calcium: 7.9 mg/dL — ABNORMAL LOW (ref 8.9–10.3)
Chloride: 114 mmol/L — ABNORMAL HIGH (ref 98–111)
Creatinine, Ser: 1.02 mg/dL — ABNORMAL HIGH (ref 0.44–1.00)
GFR, Estimated: 57 mL/min — ABNORMAL LOW (ref >60)
Glucose, Bld: 74 mg/dL (ref 70–99)
Potassium: 3.3 mmol/L — ABNORMAL LOW (ref 3.5–5.1)
Sodium: 145 mmol/L (ref 135–145)
Total Bilirubin: 1.2 mg/dL (ref 0.3–1.2)
Total Protein: 5.4 g/dL — ABNORMAL LOW (ref 6.5–8.1)

## 2022-10-07 LAB — RPR: RPR Ser Ql: NONREACTIVE

## 2022-10-07 LAB — HEPATITIS PANEL, ACUTE
HCV Ab: NONREACTIVE
Hep A IgM: NONREACTIVE
Hep B C IgM: NONREACTIVE
Hepatitis B Surface Ag: NONREACTIVE

## 2022-10-07 LAB — URINE CULTURE: Culture: NO GROWTH

## 2022-10-07 LAB — SEDIMENTATION RATE: Sed Rate: 35 mm/hr — ABNORMAL HIGH (ref 0–30)

## 2022-10-07 LAB — GAMMA GT: GGT: 153 U/L — ABNORMAL HIGH (ref 7–50)

## 2022-10-07 LAB — PROTIME-INR
INR: 1.2 (ref 0.8–1.2)
Prothrombin Time: 14.8 seconds (ref 11.4–15.2)

## 2022-10-07 LAB — HIV ANTIBODY (ROUTINE TESTING W REFLEX): HIV Screen 4th Generation wRfx: NONREACTIVE

## 2022-10-07 LAB — VITAMIN B12: Vitamin B-12: 414 pg/mL (ref 180–914)

## 2022-10-07 MED ORDER — LACTATED RINGERS IV SOLN: INTRAVENOUS | Status: AC

## 2022-10-07 MED ORDER — ALPRAZOLAM 0.5 MG PO TABS
0.5000 mg | ORAL_TABLET | Freq: Every day | ORAL | Status: DC
  Administered 2022-10-07 – 2022-10-14 (×8): 0.5 mg via ORAL
  Filled 2022-10-07 (×8): qty 1

## 2022-10-07 MED ORDER — VITAMIN B-12 1000 MCG PO TABS
1000.0000 ug | ORAL_TABLET | Freq: Every day | ORAL | Status: DC
  Administered 2022-10-07 – 2022-10-18 (×12): 1000 ug via ORAL
  Filled 2022-10-07 (×12): qty 1

## 2022-10-07 MED ORDER — THIAMINE HCL 100 MG/ML IJ SOLN
500.0000 mg | Freq: Three times a day (TID) | INTRAVENOUS | Status: DC
  Administered 2022-10-07 – 2022-10-08 (×2): 500 mg via INTRAVENOUS
  Filled 2022-10-07 (×4): qty 5

## 2022-10-07 MED ORDER — POTASSIUM CHLORIDE CRYS ER 20 MEQ PO TBCR
40.0000 meq | EXTENDED_RELEASE_TABLET | Freq: Two times a day (BID) | ORAL | Status: AC
  Administered 2022-10-07: 40 meq via ORAL
  Filled 2022-10-07: qty 2

## 2022-10-07 MED ORDER — ALPRAZOLAM 0.5 MG PO TABS: 0.5000 mg | ORAL_TABLET | Freq: Two times a day (BID) | ORAL | Status: DC

## 2022-10-07 NOTE — Progress Notes (Signed)
PROGRESS NOTE  Donna Evans    DOB: [birth year], 76 y.o.  DJT:[id number]    Code Status: DNR   DOA: 10/06/2022   LOS: 0   Brief hospital course  Donna Evans is a 76 y.o. female with a PMH significant for hypertension, CVA, depression with anxiety, migraine headache, right breast cancer s/p mastectomy, chronic pain syndrome, HFpEF, CKD stage IIIa, COPD, SLE.  They presented from home to the ED on 10/06/2022 with period of unwitnessed unconsciousness less than a day which she does not recall. She was found on the floor by a friend and was speaking incoherently.   In the ED, it was found that they had 169/76 with heart rate of 70.  She was hypothermic at 95.3. Initial work-up remarkable for normal CBC, lactic acid, troponin, and CK.  CMP notable for potassium of 3.1, AST of 15, alkaline phosphatase of 638, total bili of 1.3.  CT head was obtained that did not show any acute abnormality other than soft tissue swelling over the left parietal scalp. Brain MRI negative for acute infarct.   They were initially treated with Abx and IV fluids as well as electrolyte replacement.   Patient was admitted to medicine service for further workup and management of AMS as outlined in detail below.  10/07/22 -patient had normal appearing congitive function and conversed with me in regard to her condition on rounds this morning around 0830 but upon returning to her room this afternoon around 1500, she is unable to hold a meaningful conversation. Her speech is slurred and she frequently speaks tangentially on unrelated topics to the group in the room. She is simultaneously oriented x4 and able to say she does not want to go to [facility] but is agreeable to SNF otherwise. Makes several conversations about someone from [place], playing slots in [place] and that she doesn't do well on her knees because she's fat. She has not recently received any sedating or altering medications. She may need her xanax to avoid  withdrawal as contributing. Her friend at bedside states that he talks to her on the phone multiple times per day and she will sometimes become confused but not this bad.  Assessment & Plan  Principal Problem:   Acute encephalopathy Active Problems:   Ground-level fall   Lymphedema   Elevated LFTs  Acute encephalopathy Patient presenting with 1 day history of encephalopathy with ground level fall.  Unclear if there was a syncopal episode.  Per chart review, patient has had similar previous admissions for both falls and syncope.  CT head and MRI obtained with no evidence of acute ischemia.  No electrolyte abnormalities to explain presentation.  Although patient's LFTs are elevated, she has no known history of cirrhosis or alcohol use.  Previous EEG obtained in the setting of syncope was negative. Repeat EEG negative. Troponins and CK negative. No PE or DVT.  Primary differential this morning was medication interaction/unintended effects since patient was pain free and coherent in our interactions. When I went back to revisit and update friends this afternoon, patient was having delusions and tangential speech which was noticed by SLP and confirmed as different from her baseline by her friend at baseline. She was simultaneously oriented x4. Ordered repeat stat head CT and notified neurology of her change in status. - Neurology consulted; appreciate their recommendations - Continue supportive management with IV fluids - Frequent neurochecks - B12 ordered - continue to hold/decrease sedating medications as able - Holding home Flexeril and  Ambien - PT/OT recommending SNF at dc, TOC engaged  H/o falls and balance issues H/o "memory change"   Elevated LFTs Per chart review, patient has a history of elevated alkaline phosphatase dating back to 2014 with new elevation in AST dating back to August 13, 2022 that is improving.  No known alcohol use. GGT elevated. RUQ [place] significant for diffuse liver  increased echogenicity could be sign of fatty liver vs viral infection. Hepatitis panel pending.  HTN  HLD  CHF  H/o pelvic and radial fractures from previous fall   Body mass index is 23.48 kg/m.  VTE ppx: enoxaparin (LOVENOX) injection 40 mg Start: 10/06/22 2200   Diet:     Diet   Diet NPO time specified   Consultants: Neurology  Subjective 10/07/22    Pt reports no complaints. She is oriented x4. Agreeable to SNF as long as it isn't [facility]   Objective   Vitals:   10/06/22 1904 10/06/22 2316 10/07/22 0512 10/07/22 0748  BP: (!) 156/69 (!) 151/66 (!) 154/67 (!) 155/77  Pulse: 74 80 86 86  Resp: '16 16 17 16  '$ Temp: 97.9 F (36.6 C) 98 F (36.7 C) 98.1 F (36.7 C) 98.1 F (36.7 C)  TempSrc:      SpO2: 98% 97% 94% 95%  Weight:      Height:        Intake/Output Summary (Last 24 hours) at 10/07/2022 1303 Last data filed at 10/07/2022 0500 Gross per 24 hour  Intake 541.47 ml  Output 350 ml  Net 191.47 ml   Filed Weights   10/06/22 1312  Weight: 64 kg     Physical Exam:  General: awake, alert, NAD HEENT: atraumatic, clear conjunctiva, anicteric sclera, MMM, hearing grossly normal Respiratory: normal respiratory effort. Cardiovascular: quick capillary refill, normal S1/S2, RRR, no JVD, murmurs Gastrointestinal: soft, NT, ND Nervous: A&O x4. no gross focal neurologic deficits, dysarthria present Extremities: moves all equally, no edema, normal tone Skin: dry, intact, normal temperature, normal color. No rashes, lesions or ulcers on exposed skin Psychiatry: normal mood, inappropriately laughs at triggers not present in room. Tangential speech and signs of delusions   Labs   I have personally reviewed the following labs and imaging studies CBC    Component Value Date/Time   WBC 6.4 10/07/2022 0450   RBC 3.24 (L) 10/07/2022 0450   HGB 9.8 (L) 10/07/2022 0450   HGB 11.7 (L) 02/29/2012 0057   HCT 30.3 (L) 10/07/2022 0450   HCT 34.8 (L) 02/29/2012  0057   PLT 187 10/07/2022 0450   PLT 193 02/29/2012 0057   MCV 93.5 10/07/2022 0450   MCV 90 02/29/2012 0057   MCH 30.2 10/07/2022 0450   MCHC 32.3 10/07/2022 0450   RDW 14.6 10/07/2022 0450   RDW 14.2 02/29/2012 0057   LYMPHSABS 1.0 10/06/2022 1322   LYMPHSABS 2.3 02/29/2012 0057   MONOABS 0.6 10/06/2022 1322   MONOABS 0.5 02/29/2012 0057   EOSABS 0.2 10/06/2022 1322   EOSABS 0.4 02/29/2012 0057   BASOSABS 0.0 10/06/2022 1322   BASOSABS 0.0 02/29/2012 0057      Latest Ref Rng & Units 10/07/2022    4:50 AM 10/06/2022    1:22 PM 08/13/2022    9:12 AM  BMP  Glucose 70 - 99 mg/dL 74  122  109   BUN 8 - 23 mg/dL '19  21  30   '$ Creatinine 0.44 - 1.00 mg/dL 1.02  1.00  1.42   Sodium 135 - 145  mmol/L 145  143  143   Potassium 3.5 - 5.1 mmol/L 3.3  3.1  3.5   Chloride 98 - 111 mmol/L 114  107  118   CO2 22 - 32 mmol/L '24  28  22   '$ Calcium 8.9 - 10.3 mg/dL 7.9  8.7  8.1     EEG adult  Result Date: 10/07/2022 Lora Havens, MD     10/07/2022 12:28 PM Patient Name: SALLEY BOXLEY MRN: [id number] Epilepsy Attending: Lora Havens Referring Physician/Provider: Jose Persia, MD Date: 10/07/2022 Duration: 31.51 mins Patient history: 76 year old female with altered mental status.  EEG to eval for seizure. Level of alertness: Awake AEDs during EEG study: None Technical aspects: This EEG study was done with scalp electrodes positioned according to the 10-20 International system of electrode placement. Electrical activity was reviewed with band pass filter of 1-'70Hz'$ , sensitivity of 7 uV/mm, display speed of 55m/sec with a '60Hz'$  notched filter applied as appropriate. EEG data were recorded continuously and digitally stored.  Video monitoring was available and reviewed as appropriate. Description: The posterior dominant rhythm consists of 8-9 Hz activity of moderate voltage (25-35 uV) seen predominantly in posterior head regions, symmetric and reactive to eye opening and eye closing. Physiologic  photic driving was seen during photic stimulation. Hyperventilation was not performed.   IMPRESSION: This study is within normal limits. No seizures or epileptiform discharges were seen throughout the recording. A normal interictal EEG does not exclude the diagnosis of epilepsy. Priyanka OBarbra Sarks  [place]dom[place] Limited RUQ (LIVER/GB)  Result Date: 10/07/2022 CLINICAL DATA:  Transaminitis EXAM: ULTRASOUND ABDOMEN LIMITED RIGHT UPPER QUADRANT COMPARISON:  CT abdomen pelvis 09/24/2009 FINDINGS: Gallbladder: Surgically absent Common bile duct: Diameter: 3 mm Liver: Parenchymal echogenicity: Mild diffuse increase echogenicity. Contours: Normal Lesions: None Portal vein: Patent.  Hepatopetal flow Other: None. IMPRESSION: Mild diffuse increased echogenicity of the hepatic parenchyma is a nonspecific indicator of hepatocellular dysfunction, most commonly steatosis. Electronically Signed   By: FMiachel RouxM.D.   On: 10/07/2022 09:36   [place] Img Lower Bilateral (DVT)  Result Date: 10/07/2022 CLINICAL DATA:  Syncope EXAM: Bilateral lower Extremity Venous Doppler Ultrasound TECHNIQUE: Gray-scale sonography with compression, as well as color and duplex ultrasound, were performed to evaluate the deep venous system(s) from the level of the common femoral vein through the popliteal and proximal calf veins. COMPARISON:  04/29/2022 FINDINGS: VENOUS Normal compressibility of the common femoral, superficial femoral, and popliteal veins, as well as the visualized calf veins. Visualized portions of profunda femoral vein and great saphenous vein unremarkable. No filling defects to suggest DVT on grayscale or color Doppler imaging. Doppler waveforms show normal direction of venous flow, normal respiratory plasticity and response to augmentation. OTHER None. Limitations: none IMPRESSION: No lower extremity DVT. Electronically Signed   By: FMiachel RouxM.D.   On: 10/07/2022 09:34   DG Ribs Unilateral Right  Result Date:  10/06/2022 CLINICAL DATA:  [place] right rib fractures EXAM: RIGHT RIBS - 2 VIEW COMPARISON:  Shoulder x-ray 10/06/2022, chest x-ray 10/06/2022 FINDINGS: Frontal and oblique views of the right thoracic cage are obtained. Right breast prosthesis obscures portions of the underlying ribs. Right chest is clear without airspace disease, effusion, or pneumothorax. There are chronic healed right posterior eighth and ninth rib fractures. 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 There are no acute displaced fractures. IMPRESSION: 1. Multiple chronic right rib fractures as above. No acute bony abnormality. Electronically Signed  By: Randa Ngo M.D.   On: 10/06/2022 19:47   MR BRAIN WO CONTRAST  Result Date: 10/06/2022 CLINICAL DATA:  Mental status change.  Fall today. EXAM: MRI HEAD WITHOUT CONTRAST TECHNIQUE: Multiplanar, multiecho pulse sequences of the brain and surrounding structures were obtained without intravenous contrast. COMPARISON:  CT head 10/06/2022 FINDINGS: Brain: Negative for acute infarct. Generalized mild atrophy. Chronic microvascular ischemic changes in the white matter. Pituitary overall normal in size. 5 mm T1 hyperintensity in the posterior pituitary best seen on sagittal T1 image 13. No suprasellar extension. Vascular: Normal arterial flow voids Skull and upper cervical spine: No focal skeletal lesion. Sinuses/Orbits: Mucosal edema paranasal sinuses. Air-fluid level right maxillary sinus. Bilateral cataract extraction Other: None IMPRESSION: 1. Negative for acute infarct. 2. Atrophy and chronic microvascular ischemic change in the white matter. 3. 5 mm T1 hyperintensity in the posterior pituitary. Possible Rathke's cleft cyst or pituitary adenoma. Slight enlargement since the prior MRI of 04/28/2022 Electronically Signed   By: Franchot Gallo M.D.   On: 10/06/2022 18:57   DG Shoulder Right  Result Date: 10/06/2022 CLINICAL DATA:  Right shoulder pain EXAM:  RIGHT SHOULDER - 2+ VIEW COMPARISON:  10/01/2014 FINDINGS: Healed fracture proximal right humerus. No acute fracture. Normal alignment. Multiple right rib fractures of indeterminate age. Recommend rib series of symptomatic. IMPRESSION: 1. No acute fracture of the shoulder. 2. Multiple right rib fractures of indeterminate age. Electronically Signed   By: Franchot Gallo M.D.   On: 10/06/2022 17:44   CT Head Wo Contrast  Result Date: 10/06/2022 CLINICAL DATA:  Acute stroke suspected EXAM: CT HEAD WITHOUT CONTRAST TECHNIQUE: Contiguous axial images were obtained from the base of the skull through the vertex without intravenous contrast. RADIATION DOSE REDUCTION: This exam was performed according to the departmental dose-optimization program which includes automated exposure control, adjustment of the mA and/or kV according to patient size and/or use of iterative reconstruction technique. COMPARISON:  CT Head 08/09/22 FINDINGS: Brain: No evidence of acute infarction, hemorrhage, hydrocephalus, extra-axial collection or mass lesion/mass effect. Vascular: No hyperdense vessel or unexpected calcification. Skull: Normal. Negative for fracture or focal lesion. Soft tissue swelling over the left parietal scalp Sinuses/Orbits: Bilateral lens replacement.Frothy secretions right maxillary sinus. Other: None. IMPRESSION: 1. No acute intracranial abnormality. 2. Soft tissue swelling over the left parietal scalp. No underlying fracture. 3. Frothy secretions right maxillary sinus. Correlate for acute sinusitis. Electronically Signed   By: Marin Roberts M.D.   On: 10/06/2022 15:51   DG Chest Port 1 View  Result Date: 10/06/2022 CLINICAL DATA:  Question sepsis.  Fell today. EXAM: PORTABLE CHEST 1 VIEW COMPARISON:  08/09/2022 FINDINGS: Artifact overlies the chest. Density related to bilateral breast implants. The right chest appears clear allowing for that. Abnormal density in the left lower chest could be due to left lower  lobe infiltrate/collapse/effusion. Old healed left humeral fracture. IMPRESSION: Abnormal density in the left lower chest that could be due to left lower lobe infiltrate/collapse/effusion. Electronically Signed   By: Nelson Chimes M.D.   On: 10/06/2022 14:03    Disposition Plan & Communication  Patient status: Observation  Admitted From: Home Planned disposition location: Skilled nursing facility Anticipated discharge date: TBD pending further workup and stabilization  Family Communication: friend at bedside    Author: Richarda Osmond, DO [facility] 10/07/2022, 1:03 PM   Available by Epic secure chat 7AM-7PM. If 7PM-7AM, please contact night-coverage.  [facility] contact information found on [email or web address].

## 2022-10-07 NOTE — Progress Notes (Signed)
OT Cancellation Note  Patient Details Name: Donna Evans MRN: [id number] DOB: Jun 15, 1946   Cancelled Treatment:    Reason Eval/Treat Not Completed: Other (comment) (pt leaving floor for EEG, OT will reattempt as able) Shanon Payor, OTD OTR/L  10/07/22, 10:18 AM

## 2022-10-07 NOTE — Evaluation (Addendum)
Clinical/Bedside Swallow Evaluation Patient Details  Name: Donna Evans MRN: [id number] Date of Birth: 16-Oct-1946  Today's Date: 10/07/2022 Time: SLP Start Time (ACUTE ONLY): 27 SLP Stop Time (ACUTE ONLY): 1505 SLP Time Calculation (min) (ACUTE ONLY): 45 min  Past Medical History:  Past Medical History:  Diagnosis Date   Allergic state    Anginal pain ([place])    Prinzmetal's angina   Anxiety    Arthritis    osteoarthritis   Breast cancer ([place]) 1990   right breast cancer   Chronic pain    Chronic pain    COPD (chronic obstructive pulmonary disease) (HCC)    Depression    Edema    Fibromyalgia    Foot fracture    Bilateral   GERD (gastroesophageal reflux disease)    History of kidney stones    Hypertension    Leg fracture, right    Low back pain    Lumbosacral neuritis    Medulloadrenal hyperfunction (HCC)    Migraine headache    Peripheral neuropathy    Shoulder fracture, right    Stroke ([place])    TIA   Systemic lupus erythematosus ([place])    Thyroid disease    TIA (transient ischemic attack) 11/28/2013   Transient global amnesia 2011   Past Surgical History:  Past Surgical History:  Procedure Laterality Date   ABDOMINAL HYSTERECTOMY  1981   APPENDECTOMY  1957   AUGMENTATION MAMMAPLASTY Bilateral 1990   saline submuscular   BACK SURGERY  1989   BILATERAL TOTAL MASTECTOMY WITH AXILLARY LYMPH NODE DISSECTION  1990   BREAST IMPLANT EXCHANGE Bilateral 05/11/2016   Procedure: REMOVAL AND REPLACEMENT OF BREAST IMPLANTS ;  Surgeon: Irene Limbo, MD;  Location: [facility];  Service: Plastics;  Laterality: Bilateral;   CAPSULECTOMY Bilateral 05/11/2016   Procedure: CAPSULECTOMY CAPSULORRAPHY ;  Surgeon: Irene Limbo, MD;  Location: [facility];  Service: Plastics;  Laterality: Bilateral;   CATARACT EXTRACTION W/PHACO Right 12/26/2018   Procedure: CATARACT EXTRACTION PHACO AND INTRAOCULAR LENS PLACEMENT ([place]) RIGHT;  Surgeon: Birder Robson, MD;  Location: [facility];  Service: Ophthalmology;  Laterality: Right;  [place]  00:46 CDE 8.50 Fluid pack lot # [id number]   CATARACT EXTRACTION W/PHACO Left 01/23/2019   Procedure: CATARACT EXTRACTION PHACO AND INTRAOCULAR LENS PLACEMENT ([place]) LEFT;  Surgeon: Birder Robson, MD;  Location: [facility];  Service: Ophthalmology;  Laterality: Left;  [place]  00:45 CDE  8.41 fluid pack lot # [id number]   CHOLECYSTECTOMY  1979   COLONOSCOPY WITH PROPOFOL N/A 01/25/2018   Procedure: COLONOSCOPY WITH PROPOFOL;  Surgeon: Manya Silvas, MD;  Location: [facility];  Service: Endoscopy;  Laterality: N/A;   COPD     ESOPHAGOGASTRODUODENOSCOPY (EGD) WITH PROPOFOL N/A 03/07/2018   Procedure: ESOPHAGOGASTRODUODENOSCOPY (EGD) WITH PROPOFOL;  Surgeon: Manya Silvas, MD;  Location: [facility];  Service: Endoscopy;  Laterality: N/A;   FRACTURE SURGERY     HARDWARE REMOVAL Left 06/18/2020   Procedure: HARDWARE REMOVAL;  Surgeon: Lovell Sheehan, MD;  Location: [facility];  Service: Orthopedics;  Laterality: Left;   HUMERUS IM NAIL Left 12/31/2019   Procedure: INTRAMEDULLARY (IM) NAIL HUMERAL;  Surgeon: Lovell Sheehan, MD;  Location: [facility];  Service: Orthopedics;  Laterality: Left;   LIPOSUCTION Right 05/11/2016   Procedure: LIPOSUCTION;  Surgeon: Irene Limbo, MD;  Location: [facility];  Service: Plastics;  Laterality: Right;   LIVER BIOPSY  2011   MASTECTOMY SUBCUTANEOUS Bilateral 1990   MASTOPEXY Bilateral 05/11/2016  Procedure: MASTOPEXY BILATERAL ;  Surgeon: Irene Limbo, MD;  Location: [facility];  Service: Plastics;  Laterality: Bilateral;   HPI:  Donna Evans is a 76 y.o. female s/p unwitnessed fall; confusion and difficulty understanding Donna Evans's speech noted.  Per chart Donna Evans has been having trouble with balance and frequent falls lately.  Donna Evans admitted with acute encephalopathy, ground level fall, B LE lymphedema, and elevated LFT's.  Multiple chronic R rib fx's noted on  imaging.   CXR: Abnormal density in the left lower chest that could be due to left  lower lobe infiltrate/collapse/effusion.  MRI:  Negative for acute infarct.  2. Atrophy and chronic microvascular ischemic change in the white  matter.   PMH includes htn, prior CVA (unknown deficits), GERD, breast CA s/p mastectomy, chronic pain syndrome, CKD stage IIIa, COPD, SLE, depression with anxiety, migraine headache, fibromyalgia, B foot fx, peripheral neuropathy, L humeral IM nail 12/31/2019.   OF NOTE: Donna Evans lives at home Alone.    Assessment / Plan / Recommendation  Clinical Impression   Donna Evans seen for BSE today. Friend present in room w/ Donna Evans for support. Donna Evans awake, verbal but often tangential and easily distracted during conversation -- Friend agreed. Per Donna Evans note/session, Donna Evans demonstrating impaired safety awareness. Unsure of Donna Evans's Baseline Cognitive functioning as Donna Evans lives Alone at home (she has friends and family that "call to check on her"). On RA; afebrile. WBC WNL.   Donna Evans appears to present w/ adequate oropharyngeal phase swallow w/ No oropharyngeal phase dysphagia noted, No neuromuscular deficits noted. Donna Evans consumed po trials w/ No immediate, overt, clinical s/s of aspiration during po trials.  Donna Evans appears at reduced risk for aspiration following general aspiration precautions.   During po trials, Donna Evans consumed all consistencies w/ no overt coughing, decline in vocal quality, or change in respiratory presentation during/post trials. Oral phase appeared grossly [facility] w/ timely bolus management, mastication, and control of bolus propulsion for A-P transfer for swallowing. Oral clearing achieved w/ all trial consistencies -- moistened, soft foods given.  OM Exam appeared [facility] w/ no unilateral weakness noted. Speech Clear. Donna Evans fed self w/ setup support.   Recommend a Regular consistency diet w/ well-Cut meats, moistened foods; Thin liquids. Recommend general aspiration and GERD precautions, Pills WHOLE in Puree for safer,  easier swallowing IF needed -- Donna Evans is aware of this option.  Education given on Pills in Puree; food consistencies and easy to eat options; general aspiration precautions to Donna Evans and Friend; menu supplied to order meals. NSG to reconsult if any new needs arise. NSG updated, agreed. MD updated. Recommend Dietician f/u for support. OF NOTE: Recommend f/u w/ Neurology for formal assessment of Cognition to assess strengths/weaknesses of abilities in setting of living at home Alone prior to Discharging home Alone again. Per Donna Evans/OT recommendations of SNF Discharge, recommend f/u at SNF for Cognitive screening to address safety and ADLs.  SLP Visit Diagnosis: Dysphagia, unspecified (R13.10)    Aspiration Risk   (reduced following general precs)    Diet Recommendation   Regular consistency diet w/ well-Cut meats, moistened foods; Thin liquids. Recommend general aspiration and GERD precautions. Setup support at meals if needed.   Medication Administration: Whole meds with liquid (vs Whole in puree if needed for ease)    Other  Recommendations Recommended Consults:  (formal Neurological assessment) Oral Care Recommendations: Oral care BID;Oral care before and after PO;Patient independent with oral care Other Recommendations:  (n/a)    Recommendations for follow up therapy are one component of  a multi-disciplinary discharge planning process, led by the attending physician.  Recommendations may be updated based on patient status, additional functional criteria and insurance authorization.  Follow up Recommendations No SLP follow up      Assistance Recommended at Discharge Set up Supervision/Assistance (for setup at meals)  Functional Status Assessment Patient has not had a recent decline in their functional status (re: swallowing)  Frequency and Duration  (n/a)   (n/a)       Prognosis Prognosis for Safe Diet Advancement: Good Barriers to Reach Goals: Time post onset;Severity of  deficits Barriers/Prognosis Comment: unsure of Donna Evans's baseline Cognitive status      Swallow Study   General Date of Onset: 10/06/22 HPI: Donna Evans is a 76 y.o. female s/p unwitnessed fall; confusion and difficulty understanding Donna Evans's speech noted.  Per chart Donna Evans has been having trouble with balance and frequent falls lately.  Donna Evans admitted with acute encephalopathy, ground level fall, B LE lymphedema, and elevated LFT's.  Multiple chronic R rib fx's noted on imaging.   CXR: Abnormal density in the left lower chest that could be due to left  lower lobe infiltrate/collapse/effusion.  MRI:  Negative for acute infarct.  2. Atrophy and chronic microvascular ischemic change in the white  matter.   PMH includes htn, prior CVA (unknown deficits), GERD, breast CA s/p mastectomy, chronic pain syndrome, CKD stage IIIa, COPD, SLE, depression with anxiety, migraine headache, fibromyalgia, B foot fx, peripheral neuropathy, L humeral IM nail 12/31/2019.   OF NOTE: Donna Evans lives at home Alone. Type of Study: Bedside Swallow Evaluation Previous Swallow Assessment: none Diet Prior to this Study: NPO (regular at home) Temperature Spikes Noted: No (wbc 6.4) Respiratory Status: Room air History of Recent Intubation: No Behavior/Cognition: Alert;Cooperative;Pleasant mood Oral Cavity Assessment: Within Functional Limits Oral Care Completed by SLP: Yes Oral Cavity - Dentition: Adequate natural dentition Vision: Functional for self-feeding Self-Feeding Abilities: Able to feed self;Needs set up Patient Positioning: Upright in chair Baseline Vocal Quality: Normal Volitional Cough: Strong Volitional Swallow: Able to elicit    Oral/Motor/Sensory Function Overall Oral Motor/Sensory Function: Within functional limits   Ice Chips Ice chips: Within functional limits Presentation: Spoon (fed; 1 trial)   Thin Liquid Thin Liquid: Within functional limits Presentation: Cup;Self Fed;Straw (~6 ozs total)    Nectar Thick Nectar Thick Liquid:  Not tested   Honey Thick Honey Thick Liquid: Not tested   Puree Puree: Not tested Other Comments: Donna Evans declined applesauce   Solid     Solid: Within functional limits Presentation: Self Fed (8 trials) Other Comments: moistened slightly        Orinda Kenner, MS, CCC-SLP Speech Language Pathologist Rehab Services; [place] [phone] (ascom)  Brandon Wiechman 10/07/2022,4:52 PM

## 2022-10-07 NOTE — Progress Notes (Signed)
Eeg done 

## 2022-10-07 NOTE — Procedures (Signed)
Patient Name: ARDITH TEST  MRN: [id number]  Epilepsy Attending: Lora Havens  Referring Physician/Provider: Jose Persia, MD  Date: 10/07/2022 Duration: 31.51 mins  Patient history: 76 year old female with altered mental status.  EEG to eval for seizure.  Level of alertness: Awake  AEDs during EEG study: None  Technical aspects: This EEG study was done with scalp electrodes positioned according to the 10-20 International system of electrode placement. Electrical activity was reviewed with band pass filter of 1-'70Hz'$ , sensitivity of 7 uV/mm, display speed of 68m/sec with a '60Hz'$  notched filter applied as appropriate. EEG data were recorded continuously and digitally stored.  Video monitoring was available and reviewed as appropriate.  Description: The posterior dominant rhythm consists of 8-9 Hz activity of moderate voltage (25-35 uV) seen predominantly in posterior head regions, symmetric and reactive to eye opening and eye closing. Physiologic photic driving was seen during photic stimulation. Hyperventilation was not performed.     IMPRESSION: This study is within normal limits. No seizures or epileptiform discharges were seen throughout the recording.  A normal interictal EEG does not exclude the diagnosis of epilepsy.  Deran Barro OBarbra Sarks

## 2022-10-07 NOTE — Evaluation (Addendum)
Occupational Therapy Evaluation Patient Details Name: Donna Evans MRN: [id number] DOB: May 17, 1946 Today's Date: 10/07/2022   History of Present Illness pt is a 76 year old female admitted after a ground level fall; PMH significant for hypertension, CVA, depression with anxiety, migraine headache, right breast cancer s/p mastectomy, chronic pain syndrome, HFpEF, CKD stage IIIa, COPD, SLE   Clinical Impression   Chart reviewed, pt greeted in bed with family/friends present. Pt is alert, oriented to self, place, situation; not oriented to date. Fair-poor safety awareness noted throughout, pt is pleasant and agreeable to participate in therapeutic tasks. PTA pt has a fall history, requires assist for IADLs, some ADLs. Pt amb in her house by furniture walking, has rollator/SPC for use. Pt presents with deficits in strength, endurance, activity tolerance, balance, cognition affecting safe and optimal ADL completion. Pt able to amb in hallway with RW approx 100' with +2 for lines/leads/chair follow however reports feeling dizzy and requiring immediate seated rest break. Pt requires MAX A for LB dressing. Pt/family goal is to discharge home, at this time, recommend discharge to STR to address functional deficits. If pt discharges home recommend frequent/constant supervision, mwc. OT will continue to follow acutely.   BP taken in supine: 169/98 (Map 114) HR 77 Sitting: 151/59 (MAP 88) HR 85 Standing 161/117 (MAP 131) HR 85     Recommendations for follow up therapy are one component of a multi-disciplinary discharge planning process, led by the attending physician.  Recommendations may be updated based on patient status, additional functional criteria and insurance authorization.   Follow Up Recommendations  Skilled nursing-short term rehab (<3 hours/day)    Assistance Recommended at Discharge Frequent or constant Supervision/Assistance  Patient can return home with the following A little help with  walking and/or transfers;A little help with bathing/dressing/bathroom;Assistance with cooking/housework;Direct supervision/assist for medications management;Direct supervision/assist for financial management    Functional Status Assessment  Patient has had a recent decline in their functional status and demonstrates the ability to make significant improvements in function in a reasonable and predictable amount of time.  Equipment Recommendations  Other (comment) (per next venue of care)    Recommendations for Other Services       Precautions / Restrictions Precautions Precautions: Fall Restrictions Weight Bearing Restrictions: No      Mobility Bed Mobility Overal bed mobility: Needs Assistance Bed Mobility: Supine to Sit     Supine to sit: Supervision, HOB elevated          Transfers Overall transfer level: Needs assistance Equipment used: Rolling walker (2 wheels) Transfers: Sit to/from Stand Sit to Stand: Min guard, Supervision                  Balance Overall balance assessment: Needs assistance Sitting-balance support: Feet supported Sitting balance-Leahy Scale: Good     Standing balance support: Bilateral upper extremity supported, During functional activity, Reliant on assistive device for balance Standing balance-Leahy Scale: Fair                             ADL either performed or assessed with clinical judgement   ADL Overall ADL's : Needs assistance/impaired Eating/Feeding: NPO   Grooming: Wash/dry hands;Wash/dry face;Sitting;Set up           Upper Body Dressing : Minimal assistance;Sitting   Lower Body Dressing: Maximal assistance;Sitting/lateral leans Lower Body Dressing Details (indicate cue type and reason): socks Toilet Transfer: Min guard;Minimal assistance Toilet Transfer Details (indicate cue  type and reason): simulated with RW, step by step vcs for safety Toileting- Clothing Manipulation and Hygiene: Maximal  assistance Toileting - Clothing Manipulation Details (indicate cue type and reason): anticipated     Functional mobility during ADLs: Min guard;Minimal assistance;Cueing for safety;Cueing for sequencing;Rolling walker (2 wheels) (+2 for lines/leads, chair follow; pt frequently running into items in the hallway)       Vision Baseline Vision/History: 1 Wears glasses Patient Visual Report: Blurring of vision;Other (comment) (pt reports ongoing blurring of vision- not new to this admission however is not baseline) Additional Comments: will continue to assess     Perception     Praxis      Pertinent Vitals/Pain Pain Assessment Pain Assessment: Faces Faces Pain Scale: Hurts little more Pain Location: BLE Pain Descriptors / Indicators: Discomfort Pain Intervention(s): Limited activity within patient's tolerance, Monitored during session, Repositioned     Hand Dominance     Extremity/Trunk Assessment Upper Extremity Assessment Upper Extremity Assessment: Generalized weakness   Lower Extremity Assessment Lower Extremity Assessment: Generalized weakness       Communication Communication Communication: Expressive difficulties   Cognition Arousal/Alertness: Awake/alert Behavior During Therapy: Impulsive Overall Cognitive Status: Impaired/Different from baseline Area of Impairment: Orientation, Attention, Following commands, Safety/judgement, Awareness, Problem solving                 Orientation Level: Disoriented to, Time Current Attention Level: Sustained   Following Commands: Follows one step commands with increased time Safety/Judgement: Decreased awareness of safety, Decreased awareness of deficits Awareness: Emergent Problem Solving: Slow processing, Decreased initiation, Requires verbal cues, Requires tactile cues       General Comments       Exercises Other Exercises Other Exercises: edu pt and family re: role of OT, role of rehab, falls prevention    Shoulder Instructions      Home Living Family/patient expects to be discharged to:: Private residence Living Arrangements: Alone Available Help at Discharge: Family;Available PRN/intermittently;Friend(s) (pt neice stays with her on Wednesdays to assist with shopping, family/friends check in via phone the other days) Type of Home: House Home Access: Stairs to enter [employer] of Steps: 1   Home Layout: One level     Bathroom Shower/Tub: [place]: Shower seat;Grab bars - toilet;Rollator (4 wheels);Cane - single [employer] (2 wheels)          Prior Functioning/Environment               Mobility Comments: furnature walks in the home, has rollator and SPC; frequent fals ADLs Comments: ADLs with MOD I (modified) per chart, showers with someone in the house; assist for IADLs        OT Problem List: Decreased strength;Decreased activity tolerance;Decreased knowledge of use of DME or AE;Decreased safety awareness;Decreased cognition      OT Treatment/Interventions: Self-care/ADL training;DME and/or AE instruction;Therapeutic activities;Balance training;Therapeutic exercise;Energy conservation;Patient/family education    OT Goals(Current goals can be found in the care plan section) Acute Rehab OT Goals Patient Stated Goal: go home OT Goal Formulation: With patient/family Time For Goal Achievement: 10/21/22 Potential to Achieve Goals: Good ADL Goals Pt Will Perform Grooming: with supervision;sitting Pt Will Perform Lower Body Dressing: with supervision;sitting/lateral leans Pt Will Transfer to Toilet: with supervision Pt Will Perform Toileting - Clothing Manipulation and hygiene: with supervision  OT Frequency: Min 2X/week    Co-evaluation              AM-PAC OT "  6 Clicks" Daily Activity     Outcome Measure Help from another person eating meals?: A Little Help from another person taking care of personal  grooming?: A Little Help from another person toileting, which includes using toliet, bedpan, or urinal?: A Lot Help from another person bathing (including washing, rinsing, drying)?: A Lot Help from another person to put on and taking off regular upper body clothing?: A Little Help from another person to put on and taking off regular lower body clothing?: A Lot 6 Click Score: 15   End of Session Equipment Utilized During Treatment: Gait belt;Rolling walker (2 wheels) Nurse Communication: Mobility status  Activity Tolerance: Patient tolerated treatment well Patient left: in chair;with call bell/phone within reach;with chair alarm set  OT Visit Diagnosis: Unsteadiness on feet (R26.81);Muscle weakness (generalized) (M62.81);Repeated falls (R29.6)                Time: 1117-3567 OT Time Calculation (min): 47 min Charges:  OT General Charges $OT Visit: 1 Visit OT Evaluation $OT Eval Moderate Complexity: 1 Mod  Shanon Payor, OTD OTR/L  10/07/22, 3:08 PM

## 2022-10-07 NOTE — Plan of Care (Signed)
EEG, abd ultrasound and head CT completed this shift. Patient tolerated well. Oriented x3-4 but incoherent at times. Thought glasses were broken when they were intact and patient was wearing them. Up to bathroom today. Tolerating regular diet. No pain issues. No falls this shift. VSS. Will continue to monitor.

## 2022-10-07 NOTE — Evaluation (Signed)
Physical Therapy Evaluation Patient Details Name: Donna Evans MRN: [id number] DOB: [birth year] Today's Date: 10/07/2022  History of Present Illness  Pt is a 76 y.o. female s/p unwitnessed fall; confusion and difficulty understanding pt's speech noted.  Per chart pt has been having trouble with balance and frequent falls lately.  Pt admitted with acute encephalopathy, ground level fall, B LE lymphedema, and elevated LFT's.  Multiple chronic R rib fx's noted on imaging.  PMH includes htn, prior CVA (unknown deficits), GERD, breast CA s/p mastectomy, chronic pain syndrome, CKD stage IIIa, COPD, SLE, depression with anxiety, migraine headache, fibromyalgia, B foot fx, peripheral neuropathy, L humeral IM nail 12/31/2019.  Clinical Impression  Prior to hospital admission, pt was ambulatory (furniture walking); lives alone; h/o falls.  Currently pt is CGA with transfers using RW and CGA to min assist ambulating with RW 120 feet (2nd assist for safety/lines/chair follow); limited distance ambulating d/t pt feeling dizzy and needing immediate sitting rest break.  Pt had difficulty maneuvering RW requiring assist to avoid obstacles.  Pt demonstrating impaired safety awareness.  Pt would benefit from skilled PT to address noted impairments and functional limitations (see below for any additional details).  Upon hospital discharge, pt would benefit from 24/7 assist and HHPT but pt's family reports no one is able to provide 24/7 assist; d/t this, discharge recommendations updated to SNF.    Recommendations for follow up therapy are one component of a multi-disciplinary discharge planning process, led by the attending physician.  Recommendations may be updated based on patient status, additional functional criteria and insurance authorization.  Follow Up Recommendations Skilled nursing-short term rehab (<3 hours/day) Can patient physically be transported by private vehicle: Yes    Assistance Recommended at  Discharge Frequent or constant Supervision/Assistance  Patient can return home with the following  A little help with walking and/or transfers;A little help with bathing/dressing/bathroom;Assistance with cooking/housework;Help with stairs or ramp for entrance    Equipment Recommendations Rolling walker (2 wheels);BSC/3in1  Recommendations for Other Services  OT consult    Functional Status Assessment Patient has had a recent decline in their functional status and demonstrates the ability to make significant improvements in function in a reasonable and predictable amount of time.     Precautions / Restrictions Precautions Precautions: Fall Restrictions Weight Bearing Restrictions: No      Mobility  Bed Mobility               General bed mobility comments: Deferred (pt sitting on edge of bed with OT present upon PT arrival)    Transfers Overall transfer level: Needs assistance Equipment used: Rolling walker (2 wheels) Transfers: Sit to/from Stand Sit to Stand: Min guard           General transfer comment: vc's to scoot forwards towards edge of bed prior to standing    Ambulation/Gait Ambulation/Gait assistance: Min guard, Min assist, +2 safety/equipment Gait Distance (Feet): 120 Feet Assistive device: Rolling walker (2 wheels)   Gait velocity: decreased     General Gait Details: pt demonstrating difficulty maneuvering walker requiring min assist to avoid obstacles; vc's to stay within walker  Stairs            Wheelchair Mobility    Modified Rankin (Stroke Patients Only)       Balance Overall balance assessment: Needs assistance Sitting-balance support: No upper extremity supported, Feet supported Sitting balance-Leahy Scale: Good Sitting balance - Comments: steady sitting reaching within BOS   Standing balance support: Bilateral upper extremity  supported, During functional activity, Reliant on assistive device for balance Standing  balance-Leahy Scale: Fair Standing balance comment: pt bumping into objects with RW requiring assist for safety                             Pertinent Vitals/Pain Pain Assessment Pain Assessment: Faces Faces Pain Scale: Hurts little more Pain Location: B LE's Pain Descriptors / Indicators: Discomfort Pain Intervention(s): Monitored during session, Repositioned    Home Living Family/patient expects to be discharged to:: Private residence Living Arrangements: Alone Available Help at Discharge: Family;Available PRN/intermittently;Friend(s) (pt's niece stays with her on Wednesdays to assist with shopping; family/friends check in on pt on other days via phone) Type of Home: House Home Access: Stairs to enter Entrance Stairs-Rails: Psychiatric nurse of Steps: 1   Home Layout: One level Home Equipment: Shower seat;Grab bars - toilet;Rollator (4 wheels);Cane - single Barista (2 wheels)      Prior Function Prior Level of Function : Needs assist;History of Falls (last six months)             Mobility Comments: Pt furniture walks in the home; has rollator and SPC; frequent falls ADLs Comments: Per OT eval "ADLs with MOD I (modified) per chart, showers with someone in the house; assist for IADLs"     Hand Dominance   Dominant Hand: Right    Extremity/Trunk Assessment   Upper Extremity Assessment Upper Extremity Assessment: Generalized weakness    Lower Extremity Assessment Lower Extremity Assessment: Generalized weakness       Communication   Communication: Expressive difficulties  Cognition Arousal/Alertness: Awake/alert Behavior During Therapy: Impulsive Overall Cognitive Status: Impaired/Different from baseline Area of Impairment: Orientation, Attention, Following commands, Safety/judgement, Awareness, Problem solving                 Orientation Level: Disoriented to, Time Current Attention Level: Sustained   Following  Commands: Follows one step commands with increased time Safety/Judgement: Decreased awareness of safety, Decreased awareness of deficits Awareness: Emergent Problem Solving: Slow processing, Decreased initiation, Requires verbal cues, Requires tactile cues          General Comments      Exercises     Assessment/Plan    PT Assessment Patient needs continued PT services  PT Problem List Decreased strength;Decreased activity tolerance;Decreased balance;Decreased mobility;Decreased knowledge of use of DME;Decreased safety awareness;Decreased knowledge of precautions;Pain       PT Treatment Interventions DME instruction;Gait training;Stair training;Functional mobility training;Therapeutic activities;Therapeutic exercise;Balance training;Patient/family education    PT Goals (Current goals can be found in the Care Plan section)  Acute Rehab PT Goals Patient Stated Goal: to improve mobility and balance PT Goal Formulation: With patient/family Time For Goal Achievement: 10/21/22 Potential to Achieve Goals: Good    Frequency Min 2X/week     Co-evaluation PT/OT/SLP Co-Evaluation/Treatment: Yes Reason for Co-Treatment: For patient/therapist safety;To address functional/ADL transfers;Necessary to address cognition/behavior during functional activity PT goals addressed during session: Mobility/safety with mobility;Proper use of DME OT goals addressed during session: ADL's and self-care       AM-PAC PT "6 Clicks" Mobility  Outcome Measure Help needed turning from your back to your side while in a flat bed without using bedrails?: None Help needed moving from lying on your back to sitting on the side of a flat bed without using bedrails?: A Little Help needed moving to and from a bed to a chair (including a wheelchair)?: A Little Help needed standing  up from a chair using your arms (e.g., wheelchair or bedside chair)?: A Little Help needed to walk in hospital room?: A Little Help  needed climbing 3-5 steps with a railing? : A Little 6 Click Score: 19    End of Session Equipment Utilized During Treatment: Gait belt Activity Tolerance: Patient tolerated treatment well Patient left: in chair;with call bell/phone within reach;with chair alarm set;with family/visitor present Nurse Communication: Precautions;Mobility status PT Visit Diagnosis: Other abnormalities of gait and mobility (R26.89);Muscle weakness (generalized) (M62.81);History of falling (Z91.81);Repeated falls (R29.6);Pain    Time: 1325-1344 PT Time Calculation (min) (ACUTE ONLY): 19 min   Charges:   PT Evaluation $PT Eval Low Complexity: 1 Low PT Treatments $Therapeutic Activity: 8-22 mins       Leitha Bleak, PT 10/07/22, 4:20 PM

## 2022-10-07 NOTE — Progress Notes (Signed)
SLP Cancellation Note  Patient Details Name: Donna Evans MRN: [id number] DOB: 1946-04-20   Cancelled treatment:       Reason Eval/Treat Not Completed: Medical issues which prohibited therapy. SLP attempted to complete bedside swallow assessment this AM x2, with pt off the floor (ultrasound and EEG) for both attempts. SLP will continue to pursue completion of assessment pending pt/therapist availability.    [place] J Clapp 10/07/2022, 12:46 PM

## 2022-10-08 ENCOUNTER — Inpatient Hospital Stay: Payer: PPO

## 2022-10-08 DIAGNOSIS — I4510 Unspecified right bundle-branch block: Secondary | ICD-10-CM | POA: Diagnosis present

## 2022-10-08 DIAGNOSIS — I890 Lymphedema, not elsewhere classified: Secondary | ICD-10-CM | POA: Diagnosis present

## 2022-10-08 DIAGNOSIS — F03B2 Unspecified dementia, moderate, with psychotic disturbance: Secondary | ICD-10-CM | POA: Diagnosis present

## 2022-10-08 DIAGNOSIS — M1990 Unspecified osteoarthritis, unspecified site: Secondary | ICD-10-CM | POA: Diagnosis present

## 2022-10-08 DIAGNOSIS — S0990XA Unspecified injury of head, initial encounter: Secondary | ICD-10-CM | POA: Diagnosis not present

## 2022-10-08 DIAGNOSIS — R600 Localized edema: Secondary | ICD-10-CM | POA: Diagnosis not present

## 2022-10-08 DIAGNOSIS — F05 Delirium due to known physiological condition: Secondary | ICD-10-CM | POA: Diagnosis present

## 2022-10-08 DIAGNOSIS — J449 Chronic obstructive pulmonary disease, unspecified: Secondary | ICD-10-CM | POA: Diagnosis present

## 2022-10-08 DIAGNOSIS — W1830XA Fall on same level, unspecified, initial encounter: Secondary | ICD-10-CM | POA: Diagnosis not present

## 2022-10-08 DIAGNOSIS — J189 Pneumonia, unspecified organism: Secondary | ICD-10-CM | POA: Diagnosis not present

## 2022-10-08 DIAGNOSIS — N1831 Chronic kidney disease, stage 3a: Secondary | ICD-10-CM | POA: Diagnosis present

## 2022-10-08 DIAGNOSIS — Z79899 Other long term (current) drug therapy: Secondary | ICD-10-CM | POA: Diagnosis not present

## 2022-10-08 DIAGNOSIS — Z885 Allergy status to narcotic agent status: Secondary | ICD-10-CM | POA: Diagnosis not present

## 2022-10-08 DIAGNOSIS — K219 Gastro-esophageal reflux disease without esophagitis: Secondary | ICD-10-CM | POA: Diagnosis present

## 2022-10-08 DIAGNOSIS — K760 Fatty (change of) liver, not elsewhere classified: Secondary | ICD-10-CM | POA: Diagnosis present

## 2022-10-08 DIAGNOSIS — I5042 Chronic combined systolic (congestive) and diastolic (congestive) heart failure: Secondary | ICD-10-CM | POA: Diagnosis present

## 2022-10-08 DIAGNOSIS — Z66 Do not resuscitate: Secondary | ICD-10-CM | POA: Diagnosis present

## 2022-10-08 DIAGNOSIS — R404 Transient alteration of awareness: Secondary | ICD-10-CM | POA: Diagnosis not present

## 2022-10-08 DIAGNOSIS — R55 Syncope and collapse: Secondary | ICD-10-CM | POA: Diagnosis present

## 2022-10-08 DIAGNOSIS — G894 Chronic pain syndrome: Secondary | ICD-10-CM | POA: Diagnosis present

## 2022-10-08 DIAGNOSIS — R4182 Altered mental status, unspecified: Secondary | ICD-10-CM | POA: Diagnosis not present

## 2022-10-08 DIAGNOSIS — R9431 Abnormal electrocardiogram [ECG] [EKG]: Secondary | ICD-10-CM | POA: Diagnosis present

## 2022-10-08 DIAGNOSIS — E785 Hyperlipidemia, unspecified: Secondary | ICD-10-CM | POA: Diagnosis present

## 2022-10-08 DIAGNOSIS — Z1152 Encounter for screening for COVID-19: Secondary | ICD-10-CM | POA: Diagnosis not present

## 2022-10-08 DIAGNOSIS — I480 Paroxysmal atrial fibrillation: Secondary | ICD-10-CM | POA: Diagnosis present

## 2022-10-08 DIAGNOSIS — I130 Hypertensive heart and chronic kidney disease with heart failure and stage 1 through stage 4 chronic kidney disease, or unspecified chronic kidney disease: Secondary | ICD-10-CM | POA: Diagnosis present

## 2022-10-08 DIAGNOSIS — G9340 Encephalopathy, unspecified: Secondary | ICD-10-CM | POA: Diagnosis present

## 2022-10-08 DIAGNOSIS — M797 Fibromyalgia: Secondary | ICD-10-CM | POA: Diagnosis present

## 2022-10-08 DIAGNOSIS — Z853 Personal history of malignant neoplasm of breast: Secondary | ICD-10-CM | POA: Diagnosis not present

## 2022-10-08 DIAGNOSIS — Z87891 Personal history of nicotine dependence: Secondary | ICD-10-CM | POA: Diagnosis not present

## 2022-10-08 DIAGNOSIS — R296 Repeated falls: Secondary | ICD-10-CM | POA: Diagnosis present

## 2022-10-08 LAB — MENINGITIS/ENCEPHALITIS PANEL (CSF)

## 2022-10-08 LAB — COMPREHENSIVE METABOLIC PANEL
ALT: 19 U/L (ref 0–44)
AST: 41 U/L (ref 15–41)
Albumin: 2.6 g/dL — ABNORMAL LOW (ref 3.5–5.0)
Alkaline Phosphatase: 492 U/L — ABNORMAL HIGH (ref 38–126)
Anion gap: 8 (ref 5–15)
BUN: 14 mg/dL (ref 8–23)
CO2: 24 mmol/L (ref 22–32)
Calcium: 8.1 mg/dL — ABNORMAL LOW (ref 8.9–10.3)
Chloride: 110 mmol/L (ref 98–111)
Creatinine, Ser: 1.09 mg/dL — ABNORMAL HIGH (ref 0.44–1.00)
GFR, Estimated: 53 mL/min — ABNORMAL LOW (ref >60)
Glucose, Bld: 83 mg/dL (ref 70–99)
Potassium: 3.6 mmol/L (ref 3.5–5.1)
Sodium: 142 mmol/L (ref 135–145)
Total Bilirubin: 1 mg/dL (ref 0.3–1.2)
Total Protein: 5.4 g/dL — ABNORMAL LOW (ref 6.5–8.1)

## 2022-10-08 LAB — C-REACTIVE PROTEIN: CRP: 1.9 mg/dL — ABNORMAL HIGH (ref <1.0)

## 2022-10-08 LAB — PROTEIN AND GLUCOSE, CSF
Glucose, CSF: 57 mg/dL (ref 40–70)
Total  Protein, CSF: 31 mg/dL (ref 15–45)

## 2022-10-08 LAB — CBC
HCT: 30.5 % — ABNORMAL LOW (ref 36.0–46.0)
Hemoglobin: 9.7 g/dL — ABNORMAL LOW (ref 12.0–15.0)
MCH: 30.3 pg (ref 26.0–34.0)
MCHC: 31.8 g/dL (ref 30.0–36.0)
MCV: 95.3 fL (ref 80.0–100.0)
Platelets: 179 10*3/uL (ref 150–400)
RBC: 3.2 MIL/uL — ABNORMAL LOW (ref 3.87–5.11)
RDW: 14.4 % (ref 11.5–15.5)
WBC: 5.6 10*3/uL (ref 4.0–10.5)
nRBC: 0 % (ref 0.0–0.2)

## 2022-10-08 MED ORDER — THIAMINE HCL 100 MG/ML IJ SOLN
500.0000 mg | Freq: Three times a day (TID) | INTRAVENOUS | Status: AC
  Administered 2022-10-08 – 2022-10-10 (×7): 500 mg via INTRAVENOUS
  Filled 2022-10-08 (×7): qty 5

## 2022-10-08 NOTE — Progress Notes (Signed)
PT Cancellation Note  Patient Details Name: Donna Evans MRN: [id number] DOB: Apr 15, 1946   Cancelled Treatment:    Reason Eval/Treat Not Completed: Patient at procedure or test/unavailable.  Pt currently not in her room (off floor for testing; lumbar puncture order noted in chart).  Will re-attempt PT session at a later date/time.  Leitha Bleak, PT 10/08/22, 3:51 PM

## 2022-10-08 NOTE — TOC Initial Note (Addendum)
Transition of Care [facility]) - Initial/Assessment Note    Patient Details  Name: Donna Evans MRN: [id number] Date of Birth: 04-19-1946  [facility] of [facility]) CM/SW Contact:    Magnus Ivan, LCSW Phone Number: 10/08/2022, 10:18 AM  Clinical Narrative:                 CSW spoke to patient regardng PT rec for SNF or 24 hour supervision at home.  Patient declines SNF. States she understands the rec for 24 hour supervision and is going to try to see if someone can stay with her, but regardless states she is "comfortable at home alone."  Patient states she is active with [facility] and would like to resume services with them. CSW confirmed with [place] with [facility] that they will continue to follow patient for RN, PT, and OT [facility] services.  Patient states her PCP is Dr. Elijio Miles. Pharmacy is [facility]. Patient has a cane, walker, rollator, bedside commode, and seat in her shower. Patient states she has support locally including 2 nieces and 1 nephew who can provide transportation and check on her.   3:07- Per MD, patient is not competent to refuse SNF.  CSW attempted call to Jeneen Rinks listed as primary contact, left VM requesting return call.   4:09- VM received from Jeneen Rinks and also notified by RN that patient's nephew Ronalee Belts is the main contact. Confirmed this has been updated in Epic.  CSW called Ronalee Belts who confirms he or his sister Abigail Butts will be the main contacts. Ronalee Belts stated he agrees with MD that patient is unable to make decision about SNF at this point. He states he and Abigail Butts are agreeable to SNF.  CSW asked about preferences. He stated not [facility] - but their top choices are [facility] (explained this is not an option due to them not taking people from the community) or [facility] [place]. CSW explained SNF process and is starting work up. Provided TOC contact info for weekdays per Mike's request.    Expected Discharge Plan: [place] Barriers to Discharge: Continued  Medical Work up   Patient Goals and CMS Choice Patient states their goals for this hospitalization and ongoing recovery are:: home with home health, patient refuses SNF CMS Medicare.gov Compare Post Acute Care list provided to:: Patient Choice offered to / list presented to : Patient  Expected Discharge Plan and Services Expected Discharge Plan: [place]       Living arrangements for the past 2 months: Single Family Home                           HH Arranged: RN, PT, OT [facility] Agency: [place] Date [place]: 10/08/22   Representative spoke with at [place]: [place]  Prior Living Arrangements/Services Living arrangements for the past 2 months: [place] Lives with:: Self Patient language and need for interpreter reviewed:: Yes Do you feel safe going back to the place where you live?: Yes      Need for Family Participation in Patient Care: Yes (Comment) Care giver support system in place?: Yes (comment) Current home services: DME Criminal Activity/Legal Involvement Pertinent to Current Situation/Hospitalization: No - Comment as needed  Activities of Daily Living Home Assistive Devices/Equipment: Gilford Rile (specify type) ADL Screening (condition at time of admission) Patient's cognitive ability adequate to safely complete daily activities?: Yes Is the patient deaf or have difficulty hearing?:  Yes Does the patient have difficulty seeing, even when wearing glasses/contacts?: No (use hearing aid) Does the patient have difficulty concentrating, remembering, or making decisions?: Yes Patient able to express need for assistance with ADLs?: Yes Does the patient have difficulty dressing or bathing?: No Independently performs ADLs?: Yes (appropriate for developmental age) Does the patient have difficulty walking or climbing stairs?: Yes Weakness of Legs: None Weakness of Arms/Hands: None  Permission Sought/Granted Permission sought  to share information with : Chartered certified accountant granted to share information with : Yes, Verbal Permission Granted     Permission granted to share info w AGENCY: [facility]        Emotional Assessment       Orientation: : Oriented to Self, Oriented to Place, Oriented to  Time, Oriented to Situation Alcohol / Substance Use: Not Applicable Psych Involvement: No (comment)  Admission diagnosis:  Injury of head, initial encounter [S09.90XA] Fall, initial encounter [W19.XXXA] Altered mental status, unspecified altered mental status type [R41.82] Community acquired pneumonia of left lower lobe of lung [J18.9] Ground-level fall [W18.30XA] Delirium due to another medical condition [F05] Patient Active Problem List   Diagnosis Date Noted   Delirium due to another medical condition 10/08/2022   Altered mental status    Bilateral leg edema    Community acquired pneumonia of left lower lobe of lung    Head injury    Ground-level fall 10/06/2022   Acute encephalopathy 10/06/2022   Lymphedema 10/06/2022   Elevated LFTs 10/06/2022   HLD (hyperlipidemia) 08/10/2022   Elevated alkaline phosphatase level 08/10/2022   Elevated troponin 04/28/2022   Stroke ([place]) 04/28/2022   Fall 04/28/2022   Acute renal failure superimposed on stage 3a chronic kidney disease ([place]) 04/28/2022   Chronic diastolic CHF (congestive heart failure) ([place]) 47/82/9562   Acute metabolic encephalopathy 13/07/6577   Venous insufficiency 03/31/2022   SOB (shortness of breath) on exertion 03/08/2022   Pedal edema 03/08/2022   Closed fracture of distal end of left radius 12/22/2021   Low back pain 12/17/2021   Fracture of pelvis ([place]) 11/13/2021   Bilateral leg weakness 05/13/2021   Memory change 03/04/2021   Imbalance 03/04/2021   Falls frequently 03/04/2021   Dizziness 03/04/2021   Senile osteoporosis 10/18/2019   Long term current use of opiate analgesic 07/04/2017   Long term prescription  opiate use 07/04/2017   Opiate use 07/04/2017   Bursitis of shoulder 07/04/2017   Closed fracture of upper end of humerus 07/04/2017   Knee pain 07/04/2017   Localized, primary osteoarthritis 07/04/2017   Osteoarthritis of knee 07/04/2017   Pain in limb 07/04/2017   Sprain of wrist 07/04/2017   Pain in joint involving ankle and foot 07/04/2017   Impingement syndrome of left shoulder region 06/29/2017   Trochanteric bursitis 06/29/2017   AKI (acute kidney injury) ([place]) 10/01/2016   Syncope 09/30/2016   Migraine headache 08/10/2016   Controlled substance agreement signed 06/12/2016   Insomnia 06/12/2016   Hypertension, benign 02/04/2016   Easy bruisability 11/13/2015   Osteoporosis 11/13/2015   Fibromyalgia syndrome 11/13/2015   Generalized anxiety disorder 11/13/2015   Chronic pain syndrome 11/13/2015   Overactive detrusor 09/11/2015   Adrenal adenoma 09/02/2015   Chronic cystitis 09/02/2015   Incomplete bladder emptying 46/96/2952   Renal colic 84/13/2440   Urinary tract infection 07/28/2015   Gross hematuria 07/28/2015   History of reconstruction of both breasts 06/18/2015   S/P cardiac catheterization 06/18/2015   Acquired absence of both breasts and nipples 11/20/2014  Transient cerebral ischemia 11/28/2013   Hemiplegia of dominant side ([place]) 11/20/2013   PCP:  Jodi Marble, MD Pharmacy:   [facility], [place] - [address] [address] Phone: [phone] Fax: [phone]  [facility] [place] [facility] [place] [address] [place] [address] Phone: [phone] Fax: [phone]  [id number] [id number]: [phone]: [phone]    Social Determinants of Health (SDOH) Interventions    Readmission Risk Interventions     No data to display

## 2022-10-08 NOTE — Progress Notes (Addendum)
Neurology Progress Note   Subjective: -Denies headache or any other new symptoms -Reports she was told she has many lupus-like symptoms but was never formally diagnosed with lupus.  However she notes she has 2 sisters with lupus  Exam: Vitals:   10/07/22 2347 10/08/22 0737  BP: (!) 161/86 (!) 156/85  Pulse: 85 79  Resp: 17   Temp: 98 F (36.7 C) 98 F (36.7 C)  SpO2: 97% 94%   Gen: In bed, comfortable  Resp: non-labored breathing, no grossly audible wheezing Cardiac: Perfusing extremities well  Abd: soft, nt  Neuro: MS: Awake, alert, oriented to month and year but reports it is the 25th and that it is Saturday (27th, Friday).  Able to follow simple commands, but does make some errors (for example touches her nose instead of her chin).  At times her answers remain odd for example [facility] but she knows that this is [place] CN: EOMI, pupils equal round reactive, face symmetric, tongue midline Motor: No pronator drift.  Using all 4 extremities equally. Coordination: Finger-nose intact bilaterally.  Heel-to-shin intact bilaterally Sensory: No sensory ataxia when asked to touch her nose with her arms outstretched and eyes closed.  Vibration sensation intact for 5-7 seconds in the bilateral toes.  Proprioception intact in the left great toe   Pertinent Labs:  CRP elevated at 1.9 ESR elevated at 35 B12 low normal at 414 Ammonia normal at 24  RPR negative, HIV negative, hepatitis panel negative   Latest Reference Range & Units 10/07/22 20:24  CRP <1.0 mg/dL 1.9 (H)  (H): Data is abnormally high   Latest Reference Range & Units 10/07/22 20:24  Sed Rate 0 - 30 mm/hr 35 (H)  (H): Data is abnormally high  Lower extremity duplex negative for DVT Right upper quadrant ultrasound concerning for steatosis  Impression: She does have some elevated inflammatory markers and given the reported strong family history of autoimmune disease, I  feel that a lumbar puncture for further work-up is reasonable.  As she has significant concerns about costs we will hold off on autoimmune encephalitis panels but may consider this in the future based on her trajectory.  Of note although prior records suggest sensory ataxia as an etiology of her frequent falls based on EMG/nerve conduction study, clinically she does not have any sensory ataxia on my evaluation  Recommendations: -pending MMA, thiamine, for reversible causes work-up -Pending SPEP, ANA, dsDNA, lupus anticoagulant panel, antiphospholipid, anticardiolipin, beta-2-glycoprotein, c3, c4, ch50, urine protein/cr ratio -Continue empiric thiamine supplementation to 500 mg TID for 3-day course -Continue B12 supplementation at 1000 mcg daily (ordered) -Reduce polypharmacy, taper alcohol use to a stop  -Reduce xanax from 0.5 mg BID scheduled to QHS scheduled (ordred) -Lumbar puncture today for cell counts in tubes 1 and 4, protein, glucose, meningitis/encephalitis panel, bacterial culture, oligoclonal bands, IgG index with additional fluid reserved if possible for potential autoimmune encephalitis panels or other tests pending preliminary results.  Due to scoliosis and prior lumbar surgery with difficult to ascertain landmarks, patient is not a candidate for bedside LP and I greatly appreciate IR assistance -Neurology will continue to follow  [place] [phone]

## 2022-10-08 NOTE — Plan of Care (Signed)
  Problem: Clinical Measurements: Goal: Diagnostic test results will improve Outcome: Progressing Goal: Respiratory complications will improve Outcome: Progressing Goal: Cardiovascular complication will be avoided Outcome: Progressing   Problem: Activity: Goal: Risk for activity intolerance will decrease Outcome: Progressing   Problem: Nutrition: Goal: Adequate nutrition will be maintained Outcome: Progressing   Problem: Elimination: Goal: Will not experience complications related to bowel motility Outcome: Progressing   Problem: Pain Managment: Goal: General experience of comfort will improve Outcome: Progressing

## 2022-10-08 NOTE — Progress Notes (Signed)
PROGRESS NOTE  Donna Evans    DOB: 1946/04/18, 76 y.o.  XBD:[id number]    Code Status: DNR   DOA: 10/06/2022   LOS: 0   Brief hospital course  Donna Evans is a 76 y.o. female with a PMH significant for hypertension, CVA, depression with anxiety, migraine headache, right breast cancer s/p mastectomy, chronic pain syndrome, HFpEF, CKD stage IIIa, COPD, SLE.  They presented from home to the ED on 10/06/2022 with period of unwitnessed unconsciousness less than a day which she does not recall. She was found on the floor by a friend and was speaking incoherently.   In the ED, it was found that they had 169/76 with heart rate of 70.  She was hypothermic at 95.3. Initial work-up remarkable for normal CBC, lactic acid, troponin, and CK.  CMP notable for potassium of 3.1, AST of 15, alkaline phosphatase of 638, total bili of 1.3.  CT head was obtained that did not show any acute abnormality other than soft tissue swelling over the left parietal scalp. Brain MRI negative for acute infarct.   They were initially treated with Abx and IV fluids as well as electrolyte replacement.   Patient was admitted to medicine service for further workup and management of AMS as outlined in detail below.  10/08/22 -patient seemed to be oriented but kept talking about people who I know have passed away. When I reminded her that her husband passed away she was able to recall this.   Assessment & Plan  Principal Problem:   Acute encephalopathy Active Problems:   Ground-level fall   Lymphedema   Elevated LFTs   Altered mental status   Bilateral leg edema   Community acquired pneumonia of left lower lobe of lung   Head injury  Acute encephalopathy Patient presenting with 1 day history of encephalopathy with ground level fall.  Unclear if there was a syncopal episode.  Per chart review, patient has had similar previous admissions for both falls and syncope.  CT head and MRI obtained with no evidence of acute  ischemia.  No electrolyte abnormalities to explain presentation.  Although patient's LFTs are elevated, she has no known history of cirrhosis or alcohol use.  Previous EEG obtained in the setting of syncope was negative. Repeat EEG negative. Troponins and CK negative. No PE or DVT.  Primary differential this morning was medication interaction/unintended effects since patient was pain free and coherent in our interactions. When I went back to revisit and update friends this afternoon, patient was having delusions and tangential speech which was noticed by SLP and confirmed as different from her baseline by her friend at baseline. She was simultaneously oriented x4. Ordered repeat stat head CT and notified neurology of her change in status. - Neurology consulted; appreciate their recommendations  - LP today - Continue supportive management with IV fluids - Frequent neurochecks, re-orientation - B12 ordered - continue to hold/decrease sedating medications as able - Holding home Flexeril and Ambien - PT/OT recommending SNF at dc, TOC engaged - delirium precautions  H/o falls and balance issues H/o "memory change"   Elevated LFTs Per chart review, patient has a history of elevated alkaline phosphatase dating back to 2014 with new elevation in AST dating back to August 13, 2022 that is improving.  No known alcohol use. GGT elevated. RUQ [place] significant for diffuse liver increased echogenicity could be sign of fatty liver vs viral infection. Hepatitis panel pending.  HTN  HLD  CHF  H/o pelvic  and radial fractures from previous fall   Body mass index is 23.48 kg/m.  VTE ppx: enoxaparin (LOVENOX) injection 40 mg Start: 10/06/22 2200   Diet:     Diet   Diet regular Room service appropriate? Yes with Assist; Fluid consistency: Thin   Consultants: Neurology   Subjective 10/08/22    Pt reports no complaints. She states that she is going home and her husband will take care of her. I  reminded her that she lives alone since her husband passed away 6 years ago and then she remembered. She states that she will try to get someone to stay with her. Does not want to go to SNF but I explained to her that she will not be able to go home on her own since she cannot accomplish any ADLs in her current state and she is continuously confused. She seemed to be open to going to a SNF if she can't get any help at home, possibly.    Objective   Vitals:   10/07/22 0748 10/07/22 1718 10/07/22 2347 10/08/22 0737  BP: (!) 155/77 122/85 (!) 161/86 (!) 156/85  Pulse: 86 82 85 79  Resp: '16 16 17   '$ Temp: 98.1 F (36.7 C) 97.7 F (36.5 C) 98 F (36.7 C) 98 F (36.7 C)  TempSrc:      SpO2: 95% 98% 97% 94%  Weight:      Height:        Intake/Output Summary (Last 24 hours) at 10/08/2022 0747 Last data filed at 10/08/2022 0500 Gross per 24 hour  Intake 50 ml  Output 850 ml  Net -800 ml    Filed Weights   10/06/22 1312  Weight: 64 kg     Physical Exam:  General: awake, alert, NAD HEENT: atraumatic, clear conjunctiva, anicteric sclera, MMM, hard of hearing  Respiratory: normal respiratory effort. Cardiovascular: quick capillary refill, normal S1/S2, RRR, no JVD, murmurs Gastrointestinal: soft, NT, ND Nervous: A&O x3. no gross focal neurologic deficits, dysarthria present Extremities: moves all equally, no edema, normal tone Skin: dry, intact, normal temperature, normal color. No rashes, lesions or ulcers on exposed skin Psychiatry: normal mood, tangential, confused on her belongings and individuals, still present delusions   Labs   I have personally reviewed the following labs and imaging studies CBC    Component Value Date/Time   WBC 5.6 10/08/2022 0615   RBC 3.20 (L) 10/08/2022 0615   HGB 9.7 (L) 10/08/2022 0615   HGB 11.7 (L) 02/29/2012 0057   HCT 30.5 (L) 10/08/2022 0615   HCT 34.8 (L) 02/29/2012 0057   PLT 179 10/08/2022 0615   PLT 193 02/29/2012 0057   MCV 95.3  10/08/2022 0615   MCV 90 02/29/2012 0057   MCH 30.3 10/08/2022 0615   MCHC 31.8 10/08/2022 0615   RDW 14.4 10/08/2022 0615   RDW 14.2 02/29/2012 0057   LYMPHSABS 1.0 10/06/2022 1322   LYMPHSABS 2.3 02/29/2012 0057   MONOABS 0.6 10/06/2022 1322   MONOABS 0.5 02/29/2012 0057   EOSABS 0.2 10/06/2022 1322   EOSABS 0.4 02/29/2012 0057   BASOSABS 0.0 10/06/2022 1322   BASOSABS 0.0 02/29/2012 0057      Latest Ref Rng & Units 10/08/2022    6:15 AM 10/07/2022    4:50 AM 10/06/2022    1:22 PM  BMP  Glucose 70 - 99 mg/dL 83  74  122   BUN 8 - 23 mg/dL '14  19  21   '$ Creatinine 0.44 - 1.00 mg/dL 1.09  1.02  1.00   Sodium 135 - 145 mmol/L 142  145  143   Potassium 3.5 - 5.1 mmol/L 3.6  3.3  3.1   Chloride 98 - 111 mmol/L 110  114  107   CO2 22 - 32 mmol/L '24  24  28   '$ Calcium 8.9 - 10.3 mg/dL 8.1  7.9  8.7     CT HEAD WO CONTRAST (5MM)  Result Date: 10/07/2022 CLINICAL DATA:  Altered mental status EXAM: CT HEAD WITHOUT CONTRAST TECHNIQUE: Contiguous axial images were obtained from the base of the skull through the vertex without intravenous contrast. RADIATION DOSE REDUCTION: This exam was performed according to the departmental dose-optimization program which includes automated exposure control, adjustment of the mA and/or kV according to patient size and/or use of iterative reconstruction technique. COMPARISON:  10/06/2022 FINDINGS: Brain: No acute intracranial findings are seen. There are no signs of bleeding within the cranium. Ventricles are not dilated. Cortical sulci are prominent. There is decreased density in periventricular white matter. Vascular: Unremarkable. Skull: Unremarkable. Sinuses/Orbits: There is mucosal thickening in right maxillary sinus. There is mucosal thickening and frothy density in right side of sphenoid sinus. Other: None. IMPRESSION: No acute intracranial findings are seen in noncontrast CT brain. Atrophy. Small-vessel disease. Electronically Signed   By: Elmer Picker M.D.   On: 10/07/2022 15:51   EEG adult  Result Date: 10/07/2022 Lora Havens, MD     10/07/2022 12:28 PM Patient Name: Donna Evans MRN: [id number] Epilepsy Attending: Lora Havens Referring Physician/Provider: Jose Persia, MD Date: 10/07/2022 Duration: 31.51 mins Patient history: 76 year old female with altered mental status.  EEG to eval for seizure. Level of alertness: Awake AEDs during EEG study: None Technical aspects: This EEG study was done with scalp electrodes positioned according to the 10-20 International system of electrode placement. Electrical activity was reviewed with band pass filter of 1-'70Hz'$ , sensitivity of 7 uV/mm, display speed of 29m/sec with a '60Hz'$  notched filter applied as appropriate. EEG data were recorded continuously and digitally stored.  Video monitoring was available and reviewed as appropriate. Description: The posterior dominant rhythm consists of 8-9 Hz activity of moderate voltage (25-35 uV) seen predominantly in posterior head regions, symmetric and reactive to eye opening and eye closing. Physiologic photic driving was seen during photic stimulation. Hyperventilation was not performed.   IMPRESSION: This study is within normal limits. No seizures or epileptiform discharges were seen throughout the recording. A normal interictal EEG does not exclude the diagnosis of epilepsy. Priyanka OBarbra Sarks  [place]domen Limited RUQ (LIVER/GB)  Result Date: 10/07/2022 CLINICAL DATA:  Transaminitis EXAM: ULTRASOUND ABDOMEN LIMITED RIGHT UPPER QUADRANT COMPARISON:  CT abdomen pelvis 09/24/2009 FINDINGS: Gallbladder: Surgically absent Common bile duct: Diameter: 3 mm Liver: Parenchymal echogenicity: Mild diffuse increase echogenicity. Contours: Normal Lesions: None Portal vein: Patent.  Hepatopetal flow Other: None. IMPRESSION: Mild diffuse increased echogenicity of the hepatic parenchyma is a nonspecific indicator of hepatocellular dysfunction, most commonly  steatosis. Electronically Signed   By: FMiachel RouxM.D.   On: 10/07/2022 09:36   [place]ous Img Lower Bilateral (DVT)  Result Date: 10/07/2022 CLINICAL DATA:  Syncope EXAM: Bilateral lower Extremity Venous Doppler Ultrasound TECHNIQUE: Gray-scale sonography with compression, as well as color and duplex ultrasound, were performed to evaluate the deep venous system(s) from the level of the common femoral vein through the popliteal and proximal calf veins. COMPARISON:  04/29/2022 FINDINGS: VENOUS Normal compressibility of the common femoral, superficial femoral, and popliteal veins, as  well as the visualized calf veins. Visualized portions of profunda femoral vein and great saphenous vein unremarkable. No filling defects to suggest DVT on grayscale or color Doppler imaging. Doppler waveforms show normal direction of venous flow, normal respiratory plasticity and response to augmentation. OTHER None. Limitations: none IMPRESSION: No lower extremity DVT. Electronically Signed   By: Miachel Roux M.D.   On: 10/07/2022 09:34   DG Ribs Unilateral Right  Result Date: 10/06/2022 CLINICAL DATA:  [place], right rib fractures EXAM: RIGHT RIBS - 2 VIEW COMPARISON:  Shoulder x-ray 10/06/2022, chest x-ray 10/06/2022 FINDINGS: Frontal and oblique views of the right thoracic cage are obtained. Right breast prosthesis obscures portions of the underlying ribs. Right chest is clear without airspace disease, effusion, or pneumothorax. There are chronic healed right posterior eighth and ninth rib fractures. 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 There are no acute displaced fractures. IMPRESSION: 1. Multiple chronic right rib fractures as above. No acute bony abnormality. Electronically Signed   By: Randa Ngo M.D.   On: 10/06/2022 19:47   MR BRAIN WO CONTRAST  Result Date: 10/06/2022 CLINICAL DATA:  Mental status change.  Fall today. EXAM: MRI HEAD WITHOUT CONTRAST TECHNIQUE: Multiplanar,  multiecho pulse sequences of the brain and surrounding structures were obtained without intravenous contrast. COMPARISON:  CT head 10/06/2022 FINDINGS: Brain: Negative for acute infarct. Generalized mild atrophy. Chronic microvascular ischemic changes in the white matter. Pituitary overall normal in size. 5 mm T1 hyperintensity in the posterior pituitary best seen on sagittal T1 image 13. No suprasellar extension. Vascular: Normal arterial flow voids Skull and upper cervical spine: No focal skeletal lesion. Sinuses/Orbits: Mucosal edema paranasal sinuses. Air-fluid level right maxillary sinus. Bilateral cataract extraction Other: None IMPRESSION: 1. Negative for acute infarct. 2. Atrophy and chronic microvascular ischemic change in the white matter. 3. 5 mm T1 hyperintensity in the posterior pituitary. Possible Rathke's cleft cyst or pituitary adenoma. Slight enlargement since the prior MRI of 04/28/2022 Electronically Signed   By: Franchot Gallo M.D.   On: 10/06/2022 18:57   DG Shoulder Right  Result Date: 10/06/2022 CLINICAL DATA:  Right shoulder pain EXAM: RIGHT SHOULDER - 2+ VIEW COMPARISON:  10/01/2014 FINDINGS: Healed fracture proximal right humerus. No acute fracture. Normal alignment. Multiple right rib fractures of indeterminate age. Recommend rib series of symptomatic. IMPRESSION: 1. No acute fracture of the shoulder. 2. Multiple right rib fractures of indeterminate age. Electronically Signed   By: Franchot Gallo M.D.   On: 10/06/2022 17:44   CT Head Wo Contrast  Result Date: 10/06/2022 CLINICAL DATA:  Acute stroke suspected EXAM: CT HEAD WITHOUT CONTRAST TECHNIQUE: Contiguous axial images were obtained from the base of the skull through the vertex without intravenous contrast. RADIATION DOSE REDUCTION: This exam was performed according to the departmental dose-optimization program which includes automated exposure control, adjustment of the mA and/or kV according to patient size and/or use of  iterative reconstruction technique. COMPARISON:  CT Head 08/09/22 FINDINGS: Brain: No evidence of acute infarction, hemorrhage, hydrocephalus, extra-axial collection or mass lesion/mass effect. Vascular: No hyperdense vessel or unexpected calcification. Skull: Normal. Negative for fracture or focal lesion. Soft tissue swelling over the left parietal scalp Sinuses/Orbits: Bilateral lens replacement.Frothy secretions right maxillary sinus. Other: None. IMPRESSION: 1. No acute intracranial abnormality. 2. Soft tissue swelling over the left parietal scalp. No underlying fracture. 3. Frothy secretions right maxillary sinus. Correlate for acute sinusitis. Electronically Signed   By: Marin Roberts M.D.   On: 10/06/2022  15:51   DG Chest Port 1 View  Result Date: 10/06/2022 CLINICAL DATA:  Question sepsis.  Fell today. EXAM: PORTABLE CHEST 1 VIEW COMPARISON:  08/09/2022 FINDINGS: Artifact overlies the chest. Density related to bilateral breast implants. The right chest appears clear allowing for that. Abnormal density in the left lower chest could be due to left lower lobe infiltrate/collapse/effusion. Old healed left humeral fracture. IMPRESSION: Abnormal density in the left lower chest that could be due to left lower lobe infiltrate/collapse/effusion. Electronically Signed   By: Nelson Chimes M.D.   On: 10/06/2022 14:03    Disposition Plan & Communication  Patient status: Observation  Admitted From: Home Planned disposition location: Skilled nursing facility Anticipated discharge date: TBD pending further workup and stabilization  Family Communication: none   Author: Richarda Osmond, DO [facility] 10/08/2022, 7:47 AM   Available by Epic secure chat 7AM-7PM. If 7PM-7AM, please contact night-coverage.  [facility] contact information found on [email or web address].

## 2022-10-08 NOTE — NC FL2 (Signed)
[place] LEVEL OF CARE SCREENING TOOL     IDENTIFICATION  Patient Name: Donna Evans Birthdate: 10-04-1946 Sex: female Admission Date (Current Location): 10/06/2022  [place] and [place] Number:  Engineering geologist and Address:  [facility], [address] [place], [place] [address]      Provider Number: [id number]  Attending Physician Name and Address:  Richarda Osmond, MD  Relative Name and Phone Number:  Tomma Rakers [facility])   [phone] (Mobile)    Current Level of Care: Hospital Recommended Level of Care: [place] Prior Approval Number:    Date Approved/Denied:   PASRR Number: [id number]  Discharge Plan:      Current Diagnoses: Patient Active Problem List   Diagnosis Date Noted   Delirium due to another medical condition 10/08/2022   Altered mental status    Bilateral leg edema    Community acquired pneumonia of left lower lobe of lung    Head injury    Ground-level fall 10/06/2022   Acute encephalopathy 10/06/2022   Lymphedema 10/06/2022   Elevated LFTs 10/06/2022   HLD (hyperlipidemia) 08/10/2022   Elevated alkaline phosphatase level 08/10/2022   Elevated troponin 04/28/2022   Stroke ([place]) 04/28/2022   Fall 04/28/2022   Acute renal failure superimposed on stage 3a chronic kidney disease ([place]) 04/28/2022   Chronic diastolic CHF (congestive heart failure) ([place]) 62/70/3500   Acute metabolic encephalopathy 93/81/8299   Venous insufficiency 03/31/2022   SOB (shortness of breath) on exertion 03/08/2022   Pedal edema 03/08/2022   Closed fracture of distal end of left radius 12/22/2021   Low back pain 12/17/2021   Fracture of pelvis ([place]) 11/13/2021   Bilateral leg weakness 05/13/2021   Memory change 03/04/2021   Imbalance 03/04/2021   Falls frequently 03/04/2021   Dizziness 03/04/2021   Senile osteoporosis 10/18/2019   Long term current use of opiate analgesic 07/04/2017   Long term  prescription opiate use 07/04/2017   Opiate use 07/04/2017   Bursitis of shoulder 07/04/2017   Closed fracture of upper end of humerus 07/04/2017   Knee pain 07/04/2017   Localized, primary osteoarthritis 07/04/2017   Osteoarthritis of knee 07/04/2017   Pain in limb 07/04/2017   Sprain of wrist 07/04/2017   Pain in joint involving ankle and foot 07/04/2017   Impingement syndrome of left shoulder region 06/29/2017   Trochanteric bursitis 06/29/2017   AKI (acute kidney injury) ([place]) 10/01/2016   Syncope 09/30/2016   Migraine headache 08/10/2016   Controlled substance agreement signed 06/12/2016   Insomnia 06/12/2016   Hypertension, benign 02/04/2016   Easy bruisability 11/13/2015   Osteoporosis 11/13/2015   Fibromyalgia syndrome 11/13/2015   Generalized anxiety disorder 11/13/2015   Chronic pain syndrome 11/13/2015   Overactive detrusor 09/11/2015   Adrenal adenoma 09/02/2015   Chronic cystitis 09/02/2015   Incomplete bladder emptying 37/16/9678   Renal colic 93/81/0175   Urinary tract infection 07/28/2015   Gross hematuria 07/28/2015   History of reconstruction of both breasts 06/18/2015   S/P cardiac catheterization 06/18/2015   Acquired absence of both breasts and nipples 11/20/2014   Transient cerebral ischemia 11/28/2013   Hemiplegia of dominant side ([place]) 11/20/2013    Orientation RESPIRATION BLADDER Height & Weight     Self, Time, Situation, Place  Normal Indwelling catheter Weight: 141 lb 1.5 oz (64 kg) Height:  '5\' 5"'$  (165.1 cm)  BEHAVIORAL SYMPTOMS/MOOD NEUROLOGICAL BOWEL NUTRITION STATUS      Incontinent Diet (regular)  AMBULATORY STATUS COMMUNICATION OF NEEDS  Skin   Limited Assist Verbally Bruising                       Personal Care Assistance Level of Assistance  Bathing, Feeding, Dressing Bathing Assistance: Limited assistance Feeding assistance: Limited assistance Dressing Assistance: Limited assistance     Functional Limitations Info              SPECIAL CARE FACTORS FREQUENCY  PT (By licensed PT), OT (By licensed OT)     PT Frequency: 5 times per week OT Frequency: 5 times per week            Contractures      Additional Factors Info  Code Status, Allergies Code Status Info: DNR Allergies Info: Ciprofloxacin, Codeine, Demerol (Meperidine), Fluconazole, Latex, Morphine And Related, Doxycycline, Flagyl (Metronidazole), Influenza Vaccines, Tape, Valtrex (Valacyclovir Hcl), Buprenorphine, Duloxetine Hcl, Tramadol, Amoxicillin-pot Clavulanate, Sulfamethoxazole-trimethoprim, Valacyclovir           Current Medications (10/08/2022):  This is the current hospital active medication list Current Facility-Administered Medications  Medication Dose Route Frequency Provider Last Rate Last Admin   acetaminophen (TYLENOL) tablet 650 mg  650 mg Oral Q6H PRN Jose Persia, MD   650 mg at 10/08/22 0932   Or   acetaminophen (TYLENOL) suppository 650 mg  650 mg Rectal Q6H PRN Jose Persia, MD       ALPRAZolam Duanne Moron) tablet 0.5 mg  0.5 mg Oral QHS Bhagat, Srishti L, MD   0.5 mg at 10/07/22 2218   cyanocobalamin (VITAMIN B12) tablet 1,000 mcg  1,000 mcg Oral Daily Bhagat, Srishti L, MD   1,000 mcg at 10/08/22 0936   enoxaparin (LOVENOX) injection 40 mg  40 mg Subcutaneous Q24H Jose Persia, MD   40 mg at 67/12/45 8099   folic acid injection 1 mg  1 mg Intravenous Daily Jose Persia, MD   1 mg at 10/08/22 8338   multivitamin with minerals tablet 1 tablet  1 tablet Oral Daily Jose Persia, MD   1 tablet at 10/08/22 0936   ondansetron (ZOFRAN) tablet 4 mg  4 mg Oral Q6H PRN Jose Persia, MD       Or   ondansetron (ZOFRAN) injection 4 mg  4 mg Intravenous Q6H PRN Jose Persia, MD       rosuvastatin (CRESTOR) tablet 10 mg  10 mg Oral Daily Jose Persia, MD   10 mg at 10/08/22 0936   sodium chloride flush (NS) 0.9 % injection 3 mL  3 mL Intravenous Q12H Jose Persia, MD   3 mL at 10/08/22 2505   thiamine (VITAMIN  B1) 500 mg in normal saline (50 mL) IVPB  500 mg Intravenous Q8H Bhagat, Srishti L, MD   Stopped at 10/08/22 1415     Discharge Medications: Please see discharge summary for a list of discharge medications.  Relevant Imaging Results:  Relevant Lab Results:   Additional Information SS #: [id number] [place], LCSW

## 2022-10-08 NOTE — Procedures (Signed)
On inspection of the patient's back, she has significant scoliosis as well as prior lumbar surgery which makes landmarks very challenging to be certain of.  She is therefore not a candidate for bedside LP, will request fluoroscopy guided LP.

## 2022-10-09 DIAGNOSIS — G9340 Encephalopathy, unspecified: Secondary | ICD-10-CM | POA: Diagnosis not present

## 2022-10-09 DIAGNOSIS — R4182 Altered mental status, unspecified: Secondary | ICD-10-CM | POA: Diagnosis not present

## 2022-10-09 DIAGNOSIS — R600 Localized edema: Secondary | ICD-10-CM | POA: Diagnosis not present

## 2022-10-09 DIAGNOSIS — J189 Pneumonia, unspecified organism: Secondary | ICD-10-CM | POA: Diagnosis not present

## 2022-10-09 LAB — GLUCOSE, CAPILLARY
Glucose-Capillary: 93 mg/dL (ref 70–99)
Glucose-Capillary: 119 mg/dL — ABNORMAL HIGH (ref 70–99)
Glucose-Capillary: 101 mg/dL — ABNORMAL HIGH (ref 70–99)

## 2022-10-09 LAB — CSF CELL COUNT WITH DIFFERENTIAL
Eosinophils, CSF: 0 %
Eosinophils, CSF: 0 %
Lymphs, CSF: 69 %
Lymphs, CSF: 83 %
Monocyte-Macrophage-Spinal Fluid: 17 %
Monocyte-Macrophage-Spinal Fluid: 26 %
RBC Count, CSF: 0 /mm3 (ref 0–3)
RBC Count, CSF: 155 /mm3 — ABNORMAL HIGH (ref 0–3)
Segmented Neutrophils-CSF: 0 %
Segmented Neutrophils-CSF: 5 %
Tube #: 1
Tube #: 4
WBC, CSF: 13 /mm3 (ref 0–5)
WBC, CSF: 4 /mm3 (ref 0–5)

## 2022-10-09 LAB — ANA W/REFLEX IF POSITIVE: Anti Nuclear Antibody (ANA): NEGATIVE

## 2022-10-09 LAB — ANTI-DNA ANTIBODY, DOUBLE-STRANDED: ds DNA Ab: <1 IU/mL (ref 0–9)

## 2022-10-09 MED ORDER — FOLIC ACID 1 MG PO TABS
1.0000 mg | ORAL_TABLET | Freq: Every day | ORAL | Status: DC
  Administered 2022-10-10 – 2022-10-18 (×9): 1 mg via ORAL
  Filled 2022-10-09 (×9): qty 1

## 2022-10-09 MED ORDER — HYDROCHLOROTHIAZIDE 12.5 MG PO TABS
12.5000 mg | ORAL_TABLET | Freq: Every day | ORAL | Status: DC
  Administered 2022-10-09 – 2022-10-18 (×10): 12.5 mg via ORAL
  Filled 2022-10-09 (×10): qty 1

## 2022-10-09 NOTE — Progress Notes (Signed)
PROGRESS NOTE  Donna Evans    DOB: 1946/05/09, 76 y.o.  PHX:[id number]    Code Status: DNR   DOA: 10/06/2022   LOS: 1   Brief hospital course  Donna Evans is a 76 y.o. female with a PMH significant for hypertension, CVA, depression with anxiety, migraine headache, right breast cancer s/p mastectomy, chronic pain syndrome, HFpEF, CKD stage IIIa, COPD, SLE.  They presented from home to the ED on 10/06/2022 with period of unwitnessed unconsciousness less than a day which she does not recall. She was found on the floor by a friend and was speaking incoherently.   In the ED, it was found that they had 169/76 with heart rate of 70.  She was hypothermic at 95.3. Initial work-up remarkable for normal CBC, lactic acid, troponin, and CK.  CMP notable for potassium of 3.1, AST of 15, alkaline phosphatase of 638, total bili of 1.3.  CT head was obtained that did not show any acute abnormality other than soft tissue swelling over the left parietal scalp. Brain MRI negative for acute infarct.   They were initially treated with Abx and IV fluids as well as electrolyte replacement.   Patient was admitted to medicine service for further workup and management of AMS as outlined in detail below.  10/09/22 -patient states that she has friends and family who can stay with her if she goes home. I asked her for their contact information and their names and she is unable to provide this information to me.  Assessment & Plan  Principal Problem:   Acute encephalopathy Active Problems:   Ground-level fall   Lymphedema   Elevated LFTs   Altered mental status   Bilateral leg edema   Community acquired pneumonia of left lower lobe of lung   Head injury   Delirium due to another medical condition  Acute encephalopathy Patient presenting with 1 day history of encephalopathy with ground level fall.  Unclear if there was a syncopal episode.  Per chart review, patient has had similar previous admissions for both  falls and syncope.  CT head and MRI obtained with no evidence of acute ischemia.  No electrolyte abnormalities to explain presentation.  Although patient's LFTs are elevated, she has no known history of cirrhosis or alcohol use.  Previous EEG obtained in the setting of syncope was negative. Repeat EEG negative. Troponins and CK negative. No PE or DVT.  Primary differential this morning was medication interaction/unintended effects since patient was pain free and coherent in our interactions. When I went back to revisit and update friends this afternoon, patient was having delusions and tangential speech which was noticed by SLP and confirmed as different from her baseline by her friend at baseline. She was simultaneously oriented x4. Ordered repeat stat head CT and notified neurology of her change in status. - Neurology consulted; appreciate their recommendations  - LP yesterday overall unremarkable - Frequent neurochecks, re-orientation - B12 ordered - continue to hold/decrease sedating medications as able - Holding home Flexeril and Ambien - PT/OT recommending SNF at dc, TOC engaged. Patient continues to claim she wants to go home at dc but does not demonstrate capacity or ability to care for herself at home at this time. Will continue to monitor and attempt to reach family/friends to confirm her home support - delirium precautions  H/o falls and balance issues H/o "memory change"   Elevated LFTs Per chart review, patient has a history of elevated alkaline phosphatase dating back to 2014 with  new elevation in AST dating back to August 13, 2022 that is improving.  No known alcohol use. GGT elevated. RUQ [place] significant for diffuse liver increased echogenicity could be sign of fatty liver vs viral infection. Hepatitis panel pending.  HTN  HLD  CHF  H/o pelvic and radial fractures from previous fall   Body mass index is 23.48 kg/m.  VTE ppx: enoxaparin (LOVENOX) injection 40 mg Start:  10/06/22 2200   Diet:     Diet   Diet regular Room service appropriate? Yes with Assist; Fluid consistency: Thin   Consultants: Neurology   Subjective 10/09/22    Pt reports no complaints. She tells me about several friends that can stay with her but changes her story several times and again is talking about people who are passed away and is unable to provide the names or contact information for her friends.   Objective   Vitals:   10/08/22 0737 10/08/22 1612 10/08/22 1856 10/08/22 2321  BP: (!) 156/85 (!) 173/90 (!) 178/66 (!) 174/92  Pulse: 79 82 82 85  Resp:    18  Temp: 98 F (36.7 C) 98 F (36.7 C)  98 F (36.7 C)  TempSrc:      SpO2: 94% 98%  98%  Weight:      Height:        Intake/Output Summary (Last 24 hours) at 10/09/2022 0738 Last data filed at 10/09/2022 0430 Gross per 24 hour  Intake 460 ml  Output 1100 ml  Net -640 ml    Filed Weights   10/06/22 1312  Weight: 64 kg     Physical Exam:  General: awake, alert, NAD HEENT: atraumatic, clear conjunctiva, anicteric sclera, MMM, hard of hearing  Respiratory: normal respiratory effort. Cardiovascular: quick capillary refill, normal S1/S2, RRR, no JVD, murmurs Gastrointestinal: soft, NT, ND Nervous: A&O x3. no gross focal neurologic deficits, dysarthria present Extremities: moves all equally, no edema, normal tone Skin: dry, intact, normal temperature, normal color. No rashes, lesions or ulcers on exposed skin Psychiatry: normal mood, tangential, confused on her belongings and individuals, still present delusions   Labs   I have personally reviewed the following labs and imaging studies CBC    Component Value Date/Time   WBC 5.6 10/08/2022 0615   RBC 3.20 (L) 10/08/2022 0615   HGB 9.7 (L) 10/08/2022 0615   HGB 11.7 (L) 02/29/2012 0057   HCT 30.5 (L) 10/08/2022 0615   HCT 34.8 (L) 02/29/2012 0057   PLT 179 10/08/2022 0615   PLT 193 02/29/2012 0057   MCV 95.3 10/08/2022 0615   MCV 90 02/29/2012  0057   MCH 30.3 10/08/2022 0615   MCHC 31.8 10/08/2022 0615   RDW 14.4 10/08/2022 0615   RDW 14.2 02/29/2012 0057   LYMPHSABS 1.0 10/06/2022 1322   LYMPHSABS 2.3 02/29/2012 0057   MONOABS 0.6 10/06/2022 1322   MONOABS 0.5 02/29/2012 0057   EOSABS 0.2 10/06/2022 1322   EOSABS 0.4 02/29/2012 0057   BASOSABS 0.0 10/06/2022 1322   BASOSABS 0.0 02/29/2012 0057      Latest Ref Rng & Units 10/08/2022    6:15 AM 10/07/2022    4:50 AM 10/06/2022    1:22 PM  BMP  Glucose 70 - 99 mg/dL 83  74  122   BUN 8 - 23 mg/dL '14  19  21   '$ Creatinine 0.44 - 1.00 mg/dL 1.09  1.02  1.00   Sodium 135 - 145 mmol/L 142  145  143   Potassium  3.5 - 5.1 mmol/L 3.6  3.3  3.1   Chloride 98 - 111 mmol/L 110  114  107   CO2 22 - 32 mmol/L '24  24  28   '$ Calcium 8.9 - 10.3 mg/dL 8.1  7.9  8.7     DG FL GUIDED LUMBAR PUNCTURE  Result Date: 10/08/2022 CLINICAL DATA:  Patient with altered mental status. Request received for diagnostic lumbar puncture. Ordering provider asked for high volume tap in case of need for further laboratory studies on fluid. EXAM: DIAGNOSTIC LUMBAR PUNCTURE UNDER FLUOROSCOPIC GUIDANCE COMPARISON:  None Available. FLUOROSCOPY: Radiation Exposure Index (as provided by the fluoroscopic device): 8.00 mGy Kerma PROCEDURE: Informed consent was obtained from the patient prior to the procedure, including potential complications of headache, allergy, and pain. With the patient prone, the lower back was prepped with Betadine. 1% Lidocaine was used for local anesthesia. Lumbar puncture was performed at the L4-L5 level using a 20 gauge needle with return of clear CSF with an opening pressure of 24 cm water, closing pressure of 14 cm water. 16 ml of CSF were obtained for laboratory studies. The patient tolerated the procedure well and there were no apparent complications. IMPRESSION: 1.  Technically successful lumbar puncture at L4-L5 level. 2. Opening pressure of 24 cm water, closing pressure of 14 cm water.  3.  16 mL of clear CSF collected for laboratory study. Read by: Reatha Armour, PA-C Electronically Signed   By: Albin Felling M.D.   On: 10/08/2022 16:28   CT HEAD WO CONTRAST (5MM)  Result Date: 10/07/2022 CLINICAL DATA:  Altered mental status EXAM: CT HEAD WITHOUT CONTRAST TECHNIQUE: Contiguous axial images were obtained from the base of the skull through the vertex without intravenous contrast. RADIATION DOSE REDUCTION: This exam was performed according to the departmental dose-optimization program which includes automated exposure control, adjustment of the mA and/or kV according to patient size and/or use of iterative reconstruction technique. COMPARISON:  10/06/2022 FINDINGS: Brain: No acute intracranial findings are seen. There are no signs of bleeding within the cranium. Ventricles are not dilated. Cortical sulci are prominent. There is decreased density in periventricular white matter. Vascular: Unremarkable. Skull: Unremarkable. Sinuses/Orbits: There is mucosal thickening in right maxillary sinus. There is mucosal thickening and frothy density in right side of sphenoid sinus. Other: None. IMPRESSION: No acute intracranial findings are seen in noncontrast CT brain. Atrophy. Small-vessel disease. Electronically Signed   By: Elmer Picker M.D.   On: 10/07/2022 15:51   EEG adult  Result Date: 10/07/2022 Lora Havens, MD     10/07/2022 12:28 PM Patient Name: DEA BITTING MRN: [id number] Epilepsy Attending: Lora Havens Referring Physician/Provider: Jose Persia, MD Date: 10/07/2022 Duration: 31.51 mins Patient history: 76 year old female with altered mental status.  EEG to eval for seizure. Level of alertness: Awake AEDs during EEG study: None Technical aspects: This EEG study was done with scalp electrodes positioned according to the 10-20 International system of electrode placement. Electrical activity was reviewed with band pass filter of 1-'70Hz'$ , sensitivity of 7 uV/mm, display  speed of 33m/sec with a '60Hz'$  notched filter applied as appropriate. EEG data were recorded continuously and digitally stored.  Video monitoring was available and reviewed as appropriate. Description: The posterior dominant rhythm consists of 8-9 Hz activity of moderate voltage (25-35 uV) seen predominantly in posterior head regions, symmetric and reactive to eye opening and eye closing. Physiologic photic driving was seen during photic stimulation. Hyperventilation was not performed.   IMPRESSION: This study is  within normal limits. No seizures or epileptiform discharges were seen throughout the recording. A normal interictal EEG does not exclude the diagnosis of epilepsy. Priyanka Barbra Sarks   US Abdomen Limited RUQ (LIVER/GB)  Result Date: 10/07/2022 CLINICAL DATA:  Transaminitis EXAM: ULTRASOUND ABDOMEN LIMITED RIGHT UPPER QUADRANT COMPARISON:  CT abdomen pelvis 09/24/2009 FINDINGS: Gallbladder: Surgically absent Common bile duct: Diameter: 3 mm Liver: Parenchymal echogenicity: Mild diffuse increase echogenicity. Contours: Normal Lesions: None Portal vein: Patent.  Hepatopetal flow Other: None. IMPRESSION: Mild diffuse increased echogenicity of the hepatic parenchyma is a nonspecific indicator of hepatocellular dysfunction, most commonly steatosis. Electronically Signed   By: Miachel Roux M.D.   On: 10/07/2022 09:36   US Venous Img Lower Bilateral (DVT)  Result Date: 10/07/2022 CLINICAL DATA:  Syncope EXAM: Bilateral lower Extremity Venous Doppler Ultrasound TECHNIQUE: Gray-scale sonography with compression, as well as color and duplex ultrasound, were performed to evaluate the deep venous system(s) from the level of the common femoral vein through the popliteal and proximal calf veins. COMPARISON:  04/29/2022 FINDINGS: VENOUS Normal compressibility of the common femoral, superficial femoral, and popliteal veins, as well as the visualized calf veins. Visualized portions of profunda femoral vein and great  saphenous vein unremarkable. No filling defects to suggest DVT on grayscale or color Doppler imaging. Doppler waveforms show normal direction of venous flow, normal respiratory plasticity and response to augmentation. OTHER None. Limitations: none IMPRESSION: No lower extremity DVT. Electronically Signed   By: Miachel Roux M.D.   On: 10/07/2022 09:34    Disposition Plan & Communication  Patient status: Inpatient  Admitted From: Home Planned disposition location: Skilled nursing facility Anticipated discharge date: TBD pending further workup and stabilization  Family Communication: none   Author: Richarda Osmond, DO [facility] 10/09/2022, 7:38 AM   Available by Epic secure chat 7AM-7PM. If 7PM-7AM, please contact night-coverage.  [facility] contact information found on [email or web address].

## 2022-10-09 NOTE — Progress Notes (Signed)
Neurology Progress Note   Subjective: -Denies headache or any other new symptoms, chronic backpain -Confabulates.  When asked about events of yesterday she does not mention lumbar puncture but reports that some other events a have no way to verify whether they occurred or not, related to family members visiting.  When she is asked specifically about lumbar puncture she reports though of course she remembers it and she remembers that it was done for chronic back pain and that she had at least 6 injections along her spine with steroids given  Exam: Vitals:   10/08/22 2321 10/09/22 0750  BP: (!) 174/92 (!) 177/95  Pulse: 85 79  Resp: 18 16  Temp: 98 F (36.7 C) 98.5 F (36.9 C)  SpO2: 98% 97%   Gen: In bed, comfortable  Resp: non-labored breathing, no grossly audible wheezing Cardiac: Perfusing extremities well  Abd: soft, nt  Neuro:  MS: Awake, alert, oriented to the 28th and 2023 but reports it is November.  Today she is oriented [place]  CN: EOMI with some saccadic intrusions, pupils equal round reactive, face symmetric, tongue midline Motor: No pronator drift.  Using all 4 extremities equally.   Pertinent Labs:  CRP elevated at 1.9 ESR elevated at 35 B12 low normal at 414 Ammonia normal at 24  RPR negative, HIV negative, hepatitis panel negative   Latest Reference Range & Units 10/07/22 20:24  CRP <1.0 mg/dL 1.9 (H)  (H): Data is abnormally high   Latest Reference Range & Units 10/07/22 20:24  Sed Rate 0 - 30 mm/hr 35 (H)  (H): Data is abnormally high  Lower extremity duplex negative for DVT Right upper quadrant ultrasound concerning for steatosis  ANA, dsDNA, negative  CSF: RBC 151/0, WBC 13/4, Glu 57 (Serum 93), Pro 31, M/E panel neg  EEG on 10/26 was normal  Impression: Exam today consistent with delirium with confabulation, given there are longstanding memory concerns I do wonder if there is dementia at play.  Given negative ANA I feel comfortable ruling  out potential neuropsychiatric lupus, and preliminary CSF results are reassuring against inflammation.  Based on results to date I would not pursue treatment with steroids or other immunomodulatory therapy at this time, but I think she will benefit from close outpatient follow-up with her neurologist, Dr. Melrose Nakayama  Of note although prior records suggest sensory ataxia as an etiology of her frequent falls based on EMG/nerve conduction study, clinically she does not have any significant sensory ataxia on my evaluation on 10/27  Recommendations: -pending MMA, thiamine, for reversible causes work-up  -Unfortunately she received thiamine 100 mg IV at 1733 on 10/25 and level was collected on 10/26 at 450, so thiamine level will not be accurate -Pending SPEP, lupus anticoagulant panel, antiphospholipid, anticardiolipin, beta-2-glycoprotein, c3, c4, ch50, urine protein/cr ratio -Continue empiric thiamine supplementation to 500 mg TID for 3-day course given potential for Wernicke/Korsakoff syndrome, will be completed tomorrow after which she should be discharged on 100 mg thiamine daily -Continue B12 supplementation at 1000 mcg daily (ordered) -Reduce polypharmacy, taper alcohol use to a stop  -Reduce xanax from 0.5 mg BID scheduled to QHS scheduled (ordred) -CSF results pending: oligoclonal bands, IgG index with additional fluid reserved if possible for potential autoimmune encephalitis panels or other tests pending preliminary results -Outpatient neurology follow-up for the remainder of these results and consideration of detailed neurocognitive testing -Neurology will be available as needed going forward, please reach out if any additional questions or concerns arise  Lesleigh Noe MD-PhD [facility] [phone]

## 2022-10-09 NOTE — TOC Progression Note (Addendum)
Transition of Care [facility]) - Progression Note    Patient Details  Name: Donna Evans MRN: [id number] Date of Birth: 02/09/1946  Transition of Care [facility]) CM/SW [place], LCSW Phone Number: 10/09/2022, 10:29 AM  Clinical Narrative:    Patient has a bed offer at [facility].  CSW called and spoke with nephew Ronalee Belts, he states they talked with patient yesterday and she is open to going to SNF for short term rehab. He confirmed they would like patient to go to [facility].  Ronalee Belts confirmed he or his sister Abigail Butts can transport patient to [facility] on Monday pending insurance auth. CSW updated Tammy at [facility] who confirmed they have a bed for patient on Monday pending insurance auth.  CSW called [facility] and spoke with Marlowe Kays, started SNF auth.    Expected Discharge Plan: [place] Barriers to Discharge: Continued Medical Work up  Expected Discharge Plan and Services Expected Discharge Plan: [place] arrangements for the past 2 months: Single Family Home                           HH Arranged: RN, PT, OT [facility] Agency: [place] Date [place]: 10/08/22   Representative spoke with at [place]: [place]   Social Determinants of Health ([place]) Interventions    Readmission Risk Interventions     No data to display

## 2022-10-09 NOTE — Progress Notes (Signed)
Physical Therapy Treatment Patient Details Name: Donna Evans MRN: [id number] DOB: August 05, 1946 Today's Date: 10/09/2022   History of Present Illness Pt is a 76 y.o. female s/p unwitnessed fall; confusion and difficulty understanding pt's speech noted.  Per chart pt has been having trouble with balance and frequent falls lately.  Pt admitted with acute encephalopathy, ground level fall, B LE lymphedema, and elevated LFT's.  Multiple chronic R rib fx's noted on imaging.  PMH includes htn, prior CVA (unknown deficits), GERD, breast CA s/p mastectomy, chronic pain syndrome, CKD stage IIIa, COPD, SLE, depression with anxiety, migraine headache, fibromyalgia, B foot fx, peripheral neuropathy, L humeral IM nail 12/31/2019.    PT Comments    Pt received in bed after finishing with OT. No pain at time of visit, agreed to PT. Pt required MinA to raise from edge of bed to RW and assist to gain upright static stance. Gait training with RW 148f with close CGA and occassonal assist to help maneuver RW around objects. Pt can be impulsive at times with decreased safety awareness and attention to task. Pt will benefit from SNF placement once medically cleared and possible Cognitive assessment once at Rehab. Continue PT per POC   Recommendations for follow up therapy are one component of a multi-disciplinary discharge planning process, led by the attending physician.  Recommendations may be updated based on patient status, additional functional criteria and insurance authorization.  Follow Up Recommendations  Skilled nursing-short term rehab (<3 hours/day) Can patient physically be transported by private vehicle: Yes   Assistance Recommended at Discharge Frequent or constant Supervision/Assistance  Patient can return home with the following A little help with walking and/or transfers;A little help with bathing/dressing/bathroom;Assistance with cooking/housework;Help with stairs or ramp for entrance   Equipment  Recommendations  Rolling walker (2 wheels);BSC/3in1    Recommendations for Other Services       Precautions / Restrictions Precautions Precautions: Fall Restrictions Weight Bearing Restrictions: No     Mobility  Bed Mobility Overal bed mobility: Needs Assistance Bed Mobility: Supine to Sit     Supine to sit: Supervision, HOB elevated          Transfers Overall transfer level: Needs assistance Equipment used: 1 person hand held assist Transfers: Sit to/from Stand Sit to Stand: Min assist           General transfer comment: MIN A for lift off from EOB during functional activity.    Ambulation/Gait Ambulation/Gait assistance: Min guard Gait Distance (Feet): 160 Feet Assistive device: Rolling walker (2 wheels) Gait Pattern/deviations: Step-through pattern, Decreased dorsiflexion - right, Drifts right/left (high steppage on R with decreased heel strike since high school) Gait velocity: decreased     General Gait Details: pt demonstrating difficulty maneuvering walker requiring min assist to avoid obstacles; vc's to stay within walker   Stairs             Wheelchair Mobility    Modified Rankin (Stroke Patients Only)       Balance Overall balance assessment: Needs assistance Sitting-balance support: No upper extremity supported, Feet supported Sitting balance-Leahy Scale: Good Sitting balance - Comments: steady sitting reaching within BOS   Standing balance support: Bilateral upper extremity supported, During functional activity, Reliant on assistive device for balance Standing balance-Leahy Scale: Fair Standing balance comment: Tends to reach for objects in room when performing standing ADL management.  Cognition Arousal/Alertness: Awake/alert Behavior During Therapy: Impulsive Overall Cognitive Status: No family/caregiver present to determine baseline cognitive functioning Area of Impairment: Orientation,  Attention, Following commands, Safety/judgement, Awareness, Problem solving                 Orientation Level: Disoriented to, Time, Situation Current Attention Level: Sustained   Following Commands: Follows one step commands inconsistently, Follows one step commands with increased time Safety/Judgement: Decreased awareness of safety, Decreased awareness of deficits Awareness: Emergent Problem Solving: Slow processing, Decreased initiation, Requires verbal cues, Requires tactile cues General Comments:  (Repeated vc's needed due to ? memory deficits)        Exercises General Exercises - Lower Extremity Ankle Circles/Pumps: AROM, Both, 10 reps Long Arc Quad: AROM, Both, 10 reps, Seated Hip Flexion/Marching: AROM, Both, 10 reps    General Comments General comments (skin integrity, edema, etc.): Pt states she suffers from chronic diarrhea and does not have much of an appetite      Pertinent Vitals/Pain Pain Assessment Pain Assessment: No/denies pain    Home Living                          Prior Function            PT Goals (current goals can now be found in the care plan section) Acute Rehab PT Goals Patient Stated Goal: to improve mobility and balance Progress towards PT goals: Progressing toward goals    Frequency    Min 2X/week      PT Plan Current plan remains appropriate    Co-evaluation              AM-PAC PT "6 Clicks" Mobility   Outcome Measure  Help needed turning from your back to your side while in a flat bed without using bedrails?: None Help needed moving from lying on your back to sitting on the side of a flat bed without using bedrails?: A Little Help needed moving to and from a bed to a chair (including a wheelchair)?: A Little Help needed standing up from a chair using your arms (e.g., wheelchair or bedside chair)?: A Little Help needed to walk in hospital room?: A Little Help needed climbing 3-5 steps with a railing? : A  Little 6 Click Score: 19    End of Session Equipment Utilized During Treatment: Gait belt Activity Tolerance: Patient tolerated treatment well Patient left: in chair;with call bell/phone within reach;with chair alarm set Nurse Communication: Mobility status PT Visit Diagnosis: Other abnormalities of gait and mobility (R26.89);Muscle weakness (generalized) (M62.81);History of falling (Z91.81);Repeated falls (R29.6);Pain     Time: 1240-1318 PT Time Calculation (min) (ACUTE ONLY): 38 min  Charges:  $Gait Training: 8-22 mins $Therapeutic Exercise: 8-22 mins $Therapeutic Activity: 8-22 mins                    Mikel Cella, PTA    [name] 10/09/2022, 1:37 PM

## 2022-10-09 NOTE — Plan of Care (Signed)
  Problem: Education: Goal: Knowledge of General Education information will improve Description: Including pain rating scale, medication(s)/side effects and non-pharmacologic comfort measures Outcome: Progressing   Problem: Health Behavior/Discharge Planning: Goal: Ability to manage health-related needs will improve Outcome: Progressing   Problem: Clinical Measurements: Goal: Respiratory complications will improve Outcome: Progressing Goal: Cardiovascular complication will be avoided Outcome: Progressing   Problem: Activity: Goal: Risk for activity intolerance will decrease Outcome: Progressing   Problem: Nutrition: Goal: Adequate nutrition will be maintained Outcome: Progressing   Problem: Coping: Goal: Level of anxiety will decrease Outcome: Progressing   Problem: Elimination: Goal: Will not experience complications related to bowel motility Outcome: Progressing Goal: Will not experience complications related to urinary retention Outcome: Progressing   Problem: Pain Managment: Goal: General experience of comfort will improve Outcome: Progressing

## 2022-10-09 NOTE — Progress Notes (Signed)
PHARMACIST - PHYSICIAN COMMUNICATION  CONCERNING: IV to Oral Route Change Policy  RECOMMENDATION: This patient is receiving folic acid by the intravenous route.  Based on criteria approved by the Pharmacy and Therapeutics Committee, the intravenous medication(s) is/are being converted to the equivalent oral dose form(s).   DESCRIPTION: These criteria include: The patient is eating (either orally or via tube) and/or has been taking other orally administered medications for a least 24 hours The patient has no evidence of active gastrointestinal bleeding or impaired GI absorption (gastrectomy, short bowel, patient on TNA or NPO).  If you have questions about this conversion, please contact the [place], [facility] 10/09/2022 12:12 PM

## 2022-10-09 NOTE — Progress Notes (Signed)
Occupational Therapy Treatment Patient Details Name: Donna Evans MRN: [id number] DOB: 1946/08/12 Today's Date: 10/09/2022   History of present illness Pt is a 76 y.o. female s/p unwitnessed fall; confusion and difficulty understanding pt's speech noted.  Per chart pt has been having trouble with balance and frequent falls lately.  Pt admitted with acute encephalopathy, ground level fall, B LE lymphedema, and elevated LFT's.  Multiple chronic R rib fx's noted on imaging.  PMH includes htn, prior CVA (unknown deficits), GERD, breast CA s/p mastectomy, chronic pain syndrome, CKD stage IIIa, COPD, SLE, depression with anxiety, migraine headache, fibromyalgia, B foot fx, peripheral neuropathy, L humeral IM nail 12/31/2019.   OT comments  Ms. Renovato was seen for OT treatment on this date. Upon arrival to room pt awake/alert, semi-supine in bed. Pt endorses desire to "get cleaned up a little" and later requests to don personal pajamas during session. OT facilitated seated UB/LB bathing and dressing tasks as described below. Pt requires MIN A for UB bathing/dressing and MOD A for LB bathing/dressing. Pt educated on falls prevention, safety, and routines modifications to support safety and functional independence during ADL management. She return demos learned strategies with moderate cueing for technique. Pt making good progress toward goals and continues to benefit from skilled OT services to maximize return to PLOF and minimize risk of future falls, injury, caregiver burden, and readmission. Will continue to follow POC. Discharge recommendation remains appropriate.     Recommendations for follow up therapy are one component of a multi-disciplinary discharge planning process, led by the attending physician.  Recommendations may be updated based on patient status, additional functional criteria and insurance authorization.    Follow Up Recommendations  Skilled nursing-short term rehab (<3 hours/day)     Assistance Recommended at Discharge Frequent or constant Supervision/Assistance  Patient can return home with the following  A little help with walking and/or transfers;A little help with bathing/dressing/bathroom;Assistance with cooking/housework;Direct supervision/assist for medications management;Direct supervision/assist for financial management   Equipment Recommendations       Recommendations for Other Services      Precautions / Restrictions Precautions Precautions: Fall Restrictions Weight Bearing Restrictions: No       Mobility Bed Mobility Overal bed mobility: Needs Assistance Bed Mobility: Supine to Sit     Supine to sit: Supervision, HOB elevated          Transfers Overall transfer level: Needs assistance Equipment used: 1 person hand held assist Transfers: Sit to/from Stand Sit to Stand: Min assist           General transfer comment: MIN A for lift off from EOB during functional activity.     Balance Overall balance assessment: Needs assistance Sitting-balance support: No upper extremity supported, Feet supported Sitting balance-Leahy Scale: Good Sitting balance - Comments: steady sitting reaching within BOS   Standing balance support: Bilateral upper extremity supported, During functional activity, Reliant on assistive device for balance Standing balance-Leahy Scale: Fair Standing balance comment: Tends to reach for objects in room when performing standing ADL management.                           ADL either performed or assessed with clinical judgement   ADL Overall ADL's : Needs assistance/impaired         Upper Body Bathing: Sitting;Minimal assistance   Lower Body Bathing: Minimal assistance   Upper Body Dressing : Minimal assistance;Sitting;Cueing for safety   Lower Body Dressing: Moderate assistance;Sit to/from  stand;Cueing for safety Lower Body Dressing Details (indicate cue type and reason): Mod A to thread feet into  PJ bottoms and pull pants over hips.             Functional mobility during ADLs: Min guard;Minimal assistance;Cueing for safety;Cueing for sequencing;Rolling walker (2 wheels) General ADL Comments: MIN A required for STS from EOB during functional activity.    Extremity/Trunk Assessment              Vision Baseline Vision/History: 1 Wears glasses     Perception     Praxis      Cognition Arousal/Alertness: Awake/alert Behavior During Therapy: Impulsive Overall Cognitive Status: No family/caregiver present to determine baseline cognitive functioning                   Orientation Level: Disoriented to, Time, Situation Current Attention Level: Sustained   Following Commands: Follows one step commands with increased time Safety/Judgement: Decreased awareness of safety, Decreased awareness of deficits Awareness: Emergent            Exercises Other Exercises Other Exercises: OT facilitated seated UB/LB wash up, seated UB grooming and UB dressing, and LB dressing from STS. Pt educated on falls prevention strategies, safety and routines modifications t/o session. Return demos during functional activity with moderate cueing from OT this date.    Shoulder Instructions       General Comments      Pertinent Vitals/ Pain       Pain Assessment Pain Assessment: No/denies pain  Home Living                                          Prior Functioning/Environment              Frequency  Min 2X/week        Progress Toward Goals  OT Goals(current goals can now be found in the care plan section)  Progress towards OT goals: Progressing toward goals  Acute Rehab OT Goals Patient Stated Goal: to go home OT Goal Formulation: With patient Time For Goal Achievement: 10/21/22 Potential to Achieve Goals: Good  Plan Discharge plan remains appropriate;Frequency remains appropriate    Co-evaluation                 AM-PAC OT "6  Clicks" Daily Activity     Outcome Measure   Help from another person eating meals?: A Little Help from another person taking care of personal grooming?: A Little Help from another person toileting, which includes using toliet, bedpan, or urinal?: A Lot Help from another person bathing (including washing, rinsing, drying)?: A Lot Help from another person to put on and taking off regular upper body clothing?: A Little Help from another person to put on and taking off regular lower body clothing?: A Lot 6 Click Score: 15    End of Session Equipment Utilized During Treatment: Gait belt;Rolling walker (2 wheels)  OT Visit Diagnosis: Unsteadiness on feet (R26.81);Muscle weakness (generalized) (M62.81);Repeated falls (R29.6)   Activity Tolerance Patient tolerated treatment well   Patient Left in bed;with bed alarm set;with call bell/phone within reach   Nurse Communication          Time: 8546-2703 OT Time Calculation (min): 33 min  Charges: OT General Charges $OT Visit: 1 Visit OT Treatments $Self Care/Home Management : 23-37 mins  Shara Blazing, M.S., OTR/L 10/09/22, 1:01 PM

## 2022-10-10 DIAGNOSIS — G9340 Encephalopathy, unspecified: Secondary | ICD-10-CM | POA: Diagnosis not present

## 2022-10-10 DIAGNOSIS — F05 Delirium due to known physiological condition: Secondary | ICD-10-CM | POA: Diagnosis not present

## 2022-10-10 DIAGNOSIS — R4182 Altered mental status, unspecified: Secondary | ICD-10-CM | POA: Diagnosis not present

## 2022-10-10 DIAGNOSIS — J189 Pneumonia, unspecified organism: Secondary | ICD-10-CM | POA: Diagnosis not present

## 2022-10-10 LAB — C4 COMPLEMENT: Complement C4, Body Fluid: 16 mg/dL (ref 12–38)

## 2022-10-10 LAB — ANTIPHOSPHOLIPID SYNDROME EVAL, BLD
Anticardiolipin IgA: <9 APL U/mL (ref 0–11)
Anticardiolipin IgG: <9 GPL U/mL (ref 0–14)
Anticardiolipin IgM: 14 MPL U/mL — ABNORMAL HIGH (ref 0–12)
DRVVT: 29.2 s (ref 0.0–47.0)
PTT Lupus Anticoagulant: 34.8 s (ref 0.0–43.5)
Phosphatydalserine, IgA: 3 APS Units (ref 0–19)
Phosphatydalserine, IgG: <9 Units (ref 0–30)
Phosphatydalserine, IgM: 17 Units (ref 0–30)

## 2022-10-10 LAB — GLUCOSE, CAPILLARY
Glucose-Capillary: 79 mg/dL (ref 70–99)
Glucose-Capillary: 89 mg/dL (ref 70–99)
Glucose-Capillary: 87 mg/dL (ref 70–99)

## 2022-10-10 LAB — BETA-2-GLYCOPROTEIN I ABS, IGG/M/A
Beta-2 Glyco I IgG: <9 GPI IgG units (ref 0–20)
Beta-2-Glycoprotein I IgA: <9 GPI IgA units (ref 0–25)
Beta-2-Glycoprotein I IgM: <9 GPI IgM units (ref 0–32)

## 2022-10-10 LAB — C3 COMPLEMENT: C3 Complement: 101 mg/dL (ref 82–167)

## 2022-10-10 MED ORDER — HYDROCODONE-ACETAMINOPHEN 5-325 MG PO TABS
1.0000 | ORAL_TABLET | Freq: Every evening | ORAL | Status: DC | PRN
  Administered 2022-10-13 – 2022-10-14 (×2): 1 via ORAL
  Filled 2022-10-10 (×2): qty 1

## 2022-10-10 NOTE — Plan of Care (Signed)
  Problem: Clinical Measurements: Goal: Ability to maintain clinical measurements within normal limits will improve Outcome: Progressing   Problem: Clinical Measurements: Goal: Will remain free from infection Outcome: Progressing   Problem: Activity: Goal: Risk for activity intolerance will decrease Outcome: Progressing   Problem: Nutrition: Goal: Adequate nutrition will be maintained Outcome: Progressing   Problem: Coping: Goal: Level of anxiety will decrease Outcome: Progressing   Problem: Elimination: Goal: Will not experience complications related to bowel motility Outcome: Progressing   Problem: Elimination: Goal: Will not experience complications related to urinary retention Outcome: Progressing   Problem: Safety: Goal: Ability to remain free from injury will improve Outcome: Progressing

## 2022-10-10 NOTE — Progress Notes (Signed)
Mobility Specialist - Progress Note   10/10/22 1534  Mobility  Activity Ambulated with assistance in hallway;Stood at bedside;Dangled on edge of bed  Level of Assistance Minimal assist, patient does 75% or more  Assistive Device Front wheel walker  Distance Ambulated (ft) 160 ft  Activity Response Tolerated well  $Mobility charge 1 Mobility   Pt supine in bed on RA upon arrival. Pt completes bed mobility SBA with extra time. Pt STS and ambulates 1 lap in hallway MinA. Pt needs VC for RW management and safety awareness. Pt is left in bed with needs in reach and bed alarm on.   Gretchen Short  Mobility Specialist  10/10/22 3:51 PM

## 2022-10-10 NOTE — Progress Notes (Signed)
PROGRESS NOTE  Donna Evans    DOB: March 03, 1946, 76 y.o.  GBT:[id number]    Code Status: DNR   DOA: 10/06/2022   LOS: 2   Brief hospital course  Donna Evans is a 76 y.o. female with a PMH significant for hypertension, CVA, depression with anxiety, migraine headache, right breast cancer s/p mastectomy, chronic pain syndrome, HFpEF, CKD stage IIIa, COPD, SLE.  They presented from home to the ED on 10/06/2022 with period of unwitnessed unconsciousness less than a day which she does not recall. She was found on the floor by a friend and was speaking incoherently.   In the ED, it was found that they had 169/76 with heart rate of 70.  She was hypothermic at 95.3. Initial work-up remarkable for normal CBC, lactic acid, troponin, and CK.  CMP notable for potassium of 3.1, AST of 15, alkaline phosphatase of 638, total bili of 1.3.  CT head was obtained that did not show any acute abnormality other than soft tissue swelling over the left parietal scalp. Brain MRI negative for acute infarct.   They were initially treated with Abx and IV fluids as well as electrolyte replacement.   Patient was admitted to medicine service for further workup and management of AMS as outlined in detail below.  10/10/22 -patient is agreeable to SNF. Insurance has denied. I called the peer-to-peer line but no answer. Left a voicemail with callback number.   Assessment & Plan  Principal Problem:   Acute encephalopathy Active Problems:   Ground-level fall   Lymphedema   Elevated LFTs   Altered mental status   Bilateral leg edema   Community acquired pneumonia of left lower lobe of lung   Head injury   Delirium due to another medical condition  Acute encephalopathy Patient presenting with 1 day history of encephalopathy with ground level fall.  Unclear if there was a syncopal episode.  Per chart review, patient has had similar previous admissions for both falls and syncope.  CT head and MRI obtained with no  evidence of acute ischemia.  No electrolyte abnormalities to explain presentation.  Although patient's LFTs are elevated, she has no known history of cirrhosis or alcohol use.  Previous EEG obtained in the setting of syncope was negative. Repeat EEG negative. Troponins and CK negative. No PE or DVT.  Primary differential this morning was medication interaction/unintended effects since patient was pain free and coherent in our interactions. When I went back to revisit and update friends this afternoon, patient was having delusions and tangential speech which was noticed by SLP and confirmed as different from her baseline by her friend at baseline. She was simultaneously oriented x4. Ordered repeat stat head CT and notified neurology of her change in status. - Neurology consulted; appreciate their recommendations  - LP yesterday overall unremarkable - Frequent neurochecks, re-orientation - B12 ordered - continue to hold/decrease sedating medications as able - Holding home Flexeril and Ambien - PT/OT recommending SNF at dc, TOC engaged. Patient continues to claim she wants to go home at dc but does not demonstrate capacity or ability to care for herself at home at this time. Will continue to monitor and attempt to reach family/friends to confirm her home support - delirium precautions  H/o falls and balance issues H/o "memory change"   Elevated LFTs Per chart review, patient has a history of elevated alkaline phosphatase dating back to 2014 with new elevation in AST dating back to August 13, 2022 that is improving.  No known alcohol use. GGT elevated. RUQ [place] significant for diffuse liver increased echogenicity could be sign of fatty liver vs viral infection. Hepatitis panel pending.  HTN  HLD  CHF  H/o pelvic and radial fractures from previous fall   Body mass index is 23.48 kg/m.  VTE ppx: enoxaparin (LOVENOX) injection 40 mg Start: 10/06/22 2200   Diet:     Diet   Diet regular Room  service appropriate? Yes with Assist; Fluid consistency: Thin   Consultants: Neurology   Subjective 10/10/22    Pt reports no complaints other than back pain. Has not been out of bed except with therapy. She is willing to go to SNF.    Objective   Vitals:   10/08/22 2321 10/09/22 0750 10/09/22 1829 10/10/22 0021  BP: (!) 174/92 (!) 177/95 (!) 198/80 (!) 185/97  Pulse: 85 79 81 92  Resp: '18 16 16 16  '$ Temp: 98 F (36.7 C) 98.5 F (36.9 C) 98 F (36.7 C) 98 F (36.7 C)  TempSrc:      SpO2: 98% 97% 97% 98%  Weight:      Height:        Intake/Output Summary (Last 24 hours) at 10/10/2022 0800 Last data filed at 10/10/2022 0600 Gross per 24 hour  Intake --  Output 1000 ml  Net -1000 ml    Filed Weights   10/06/22 1312  Weight: 64 kg     Physical Exam:  General: awake, alert, NAD HEENT: atraumatic, clear conjunctiva, anicteric sclera, MMM, hard of hearing  Respiratory: normal respiratory effort. Cardiovascular: quick capillary refill, normal S1/S2, RRR, no JVD, murmurs Gastrointestinal: soft, NT, ND Nervous: A&O x3. no gross focal neurologic deficits, dysarthria present Extremities: moves all equally, no edema, normal tone Skin: dry, intact, normal temperature, normal color. No rashes, lesions or ulcers on exposed skin Psychiatry: normal mood. Speech is more clear on congruent today.  Labs   I have personally reviewed the following labs and imaging studies CBC    Component Value Date/Time   WBC 5.6 10/08/2022 0615   RBC 3.20 (L) 10/08/2022 0615   HGB 9.7 (L) 10/08/2022 0615   HGB 11.7 (L) 02/29/2012 0057   HCT 30.5 (L) 10/08/2022 0615   HCT 34.8 (L) 02/29/2012 0057   PLT 179 10/08/2022 0615   PLT 193 02/29/2012 0057   MCV 95.3 10/08/2022 0615   MCV 90 02/29/2012 0057   MCH 30.3 10/08/2022 0615   MCHC 31.8 10/08/2022 0615   RDW 14.4 10/08/2022 0615   RDW 14.2 02/29/2012 0057   LYMPHSABS 1.0 10/06/2022 1322   LYMPHSABS 2.3 02/29/2012 0057   MONOABS 0.6  10/06/2022 1322   MONOABS 0.5 02/29/2012 0057   EOSABS 0.2 10/06/2022 1322   EOSABS 0.4 02/29/2012 0057   BASOSABS 0.0 10/06/2022 1322   BASOSABS 0.0 02/29/2012 0057      Latest Ref Rng & Units 10/08/2022    6:15 AM 10/07/2022    4:50 AM 10/06/2022    1:22 PM  BMP  Glucose 70 - 99 mg/dL 83  74  122   BUN 8 - 23 mg/dL '14  19  21   '$ Creatinine 0.44 - 1.00 mg/dL 1.09  1.02  1.00   Sodium 135 - 145 mmol/L 142  145  143   Potassium 3.5 - 5.1 mmol/L 3.6  3.3  3.1   Chloride 98 - 111 mmol/L 110  114  107   CO2 22 - 32 mmol/L 24  24  28  Calcium 8.9 - 10.3 mg/dL 8.1  7.9  8.7     DG FL GUIDED LUMBAR PUNCTURE  Result Date: 10/08/2022 CLINICAL DATA:  Patient with altered mental status. Request received for diagnostic lumbar puncture. Ordering provider asked for high volume tap in case of need for further laboratory studies on fluid. EXAM: DIAGNOSTIC LUMBAR PUNCTURE UNDER FLUOROSCOPIC GUIDANCE COMPARISON:  None Available. FLUOROSCOPY: Radiation Exposure Index (as provided by the fluoroscopic device): 8.00 mGy Kerma PROCEDURE: Informed consent was obtained from the patient prior to the procedure, including potential complications of headache, allergy, and pain. With the patient prone, the lower back was prepped with Betadine. 1% Lidocaine was used for local anesthesia. Lumbar puncture was performed at the L4-L5 level using a 20 gauge needle with return of clear CSF with an opening pressure of 24 cm water, closing pressure of 14 cm water. 16 ml of CSF were obtained for laboratory studies. The patient tolerated the procedure well and there were no apparent complications. IMPRESSION: 1.  Technically successful lumbar puncture at L4-L5 level. 2. Opening pressure of 24 cm water, closing pressure of 14 cm water. 3.  16 mL of clear CSF collected for laboratory study. Read by: Reatha Armour, PA-C Electronically Signed   By: Albin Felling M.D.   On: 10/08/2022 16:28    Disposition Plan & Communication   Patient status: Inpatient  Admitted From: Home Planned disposition location: Skilled nursing facility Anticipated discharge date: TBD pending further workup and stabilization  Family Communication: none   Author: Richarda Osmond, DO [facility] 10/10/2022, 8:00 AM   Available by Epic secure chat 7AM-7PM. If 7PM-7AM, please contact night-coverage.  [facility] contact information found on [email or web address].

## 2022-10-10 NOTE — TOC Progression Note (Signed)
Transition of Care [facility]) - Progression Note    Patient Details  Name: Donna Evans MRN: [id number] Date of Birth: Jan 02, 1946  Transition of Care [facility]) CM/SW [place], LCSW Phone Number: 10/10/2022, 9:32 AM  Clinical Narrative:    Call from [place] with [employer] who stated they have denied SNF. Peer to peer offered, due at noon on 10/30. MD to call Meyer Russel NP at [phone]. Notified MD.    Expected Discharge Plan: [place] Barriers to Discharge: Continued Medical Work up  Expected Discharge Plan and Services Expected Discharge Plan: [place] arrangements for the past 2 months: Single Family Home                           HH Arranged: RN, PT, OT [facility] Agency: [place] Date [place]: 10/08/22   Representative spoke with at [place]: [place]   Social Determinants of Health ([place]) Interventions    Readmission Risk Interventions     No data to display

## 2022-10-11 ENCOUNTER — Encounter (INDEPENDENT_AMBULATORY_CARE_PROVIDER_SITE_OTHER): Payer: Self-pay

## 2022-10-11 DIAGNOSIS — J189 Pneumonia, unspecified organism: Secondary | ICD-10-CM | POA: Diagnosis not present

## 2022-10-11 DIAGNOSIS — G9340 Encephalopathy, unspecified: Secondary | ICD-10-CM | POA: Diagnosis not present

## 2022-10-11 DIAGNOSIS — R4182 Altered mental status, unspecified: Secondary | ICD-10-CM | POA: Diagnosis not present

## 2022-10-11 DIAGNOSIS — R600 Localized edema: Secondary | ICD-10-CM | POA: Diagnosis not present

## 2022-10-11 LAB — CULTURE, BLOOD (ROUTINE X 2)
Culture: NO GROWTH
Culture: NO GROWTH
Special Requests: ADEQUATE

## 2022-10-11 LAB — PROTEIN / CREATININE RATIO, URINE
Creatinine, Urine: 89 mg/dL
Protein Creatinine Ratio: 0.18 mg/mg{Cre} — ABNORMAL HIGH (ref 0.00–0.15)
Total Protein, Urine: 16 mg/dL

## 2022-10-11 LAB — CSF CULTURE W GRAM STAIN
Culture: NO GROWTH
Gram Stain: NONE SEEN

## 2022-10-11 LAB — VITAMIN B1: Vitamin B1 (Thiamine): 229 nmol/L — ABNORMAL HIGH (ref 66.5–200.0)

## 2022-10-11 LAB — IGG CSF INDEX
Albumin CSF-mCnc: 21 mg/dL (ref 10–46)
Albumin: 2.7 g/dL — ABNORMAL LOW (ref 3.8–4.8)
CSF IgG Index: 0.4 (ref 0.0–0.7)
IgG (Immunoglobin G), Serum: 1186 mg/dL (ref 586–1602)
IgG, CSF: 3.6 mg/dL (ref 0.0–6.7)
IgG/Alb Ratio, CSF: 0.17 (ref 0.00–0.25)

## 2022-10-11 LAB — METHYLMALONIC ACID, SERUM: Methylmalonic Acid, Quantitative: 362 nmol/L (ref 0–378)

## 2022-10-11 NOTE — TOC Progression Note (Addendum)
Transition of Care [facility]) - Progression Note    Patient Details  Name: Donna Evans MRN: [id number] Date of Birth: 1946-05-23  Transition of [facility]) CM/SW Contact  Laurena Slimmer, RN Phone Number: 10/11/2022, 11:53 AM  Clinical Narrative:    Spoke with patient's nephew, Ronalee Belts. He was advised about process of denial. Per MD Peer to Peer was completed 10/29 and was upheld. Ronalee Belts was informed once the official denial was received he would have an option to an appeal including a fast appeal option. Ronalee Belts inquired about assigned TOC for 10/31. He was provided contact information for covering TOC, Deliliah Louvet.   Spoke with Marlowe Kays from [place]. Marlowe Kays reported SNF admission was denied. She was provided phone number to fax denial.   1:40pm Fax received from [employer]. Marlowe Kays from [employer] advised fax was received. Denial hand delivered to room. Attempt to contact patient's nephew, Ronalee Belts. No answer, Left a VM  by phone of denial, appeal options, and time sensitivity for of 72 hours for fast appeal.   2:15pm Spoke with patient's nephew, Ronalee Belts. He stated he received VM. VM message reiterated. Ronalee Belts was advised of patient's rights for fast appeal.    Expected Discharge Plan: [place] Barriers to Discharge: Continued Medical Work up  Expected Discharge Plan and Services Expected Discharge Plan: [place] arrangements for the past 2 months: Single Family Home                           HH Arranged: RN, PT, OT [facility] Agency: [place] Date [place]: 10/08/22   Representative spoke with at [place]: [place]   Social Determinants of Health ([place]) Interventions    Readmission Risk Interventions     No data to display

## 2022-10-11 NOTE — Progress Notes (Signed)
Patient up to chair for most of morning.  When attempting to get patient out of chair and back to bed, pt's knees buckled and she was unable to support her weight.  Patient was assisted back to bed.  Also, patient seems extremely anxious, almost to the point of hyperventilating.  Per SO and family friend at bedside, patient's mentation seems much worse today than previous days.  Patient is able to tell me month/year and current president however is unable to state location (says she is at a hair salon and then later at the doctor's office).  Earlier this a.m. patient was oriented to place/situation.  BP elevated, MD notified of this as well.  Complains of nausea; medicated per order.

## 2022-10-11 NOTE — Progress Notes (Signed)
Physical Therapy Treatment Patient Details Name: Donna Evans MRN: [id number] DOB: 1946-02-11 Today's Date: 10/11/2022   History of Present Illness Pt is a 76 y.o. female s/p unwitnessed fall; confusion and difficulty understanding pt's speech noted.  Per chart pt has been having trouble with balance and frequent falls lately.  Pt admitted with acute encephalopathy, ground level fall, B LE lymphedema, and elevated LFT's.  Multiple chronic R rib fx's noted on imaging.  PMH includes htn, prior CVA (unknown deficits), GERD, breast CA s/p mastectomy, chronic pain syndrome, CKD stage IIIa, COPD, SLE, depression with anxiety, migraine headache, fibromyalgia, B foot fx, peripheral neuropathy, L humeral IM nail 12/31/2019.    PT Comments    Pt was last seen by this therapist on 10/28 for distance gait training with RW and increased mobility. Pt received this date with significant change in mental status. Very anxious, rambling verbally at times, with visual hallucinations. Pt required [place] for safe sit<>stand transfers and +2 to walk in room with antalgic gait, poor coordination and tremorous movements throughout. Pt unsafe to attempt further distance due to significant decline in mentation and functional mobility from 2 days prior. Pt does not appear safe for d/c at this time and recs continue to be SNF once medically stable. MD and Nursing aware.    Recommendations for follow up therapy are one component of a multi-disciplinary discharge planning process, led by the attending physician.  Recommendations may be updated based on patient status, additional functional criteria and insurance authorization.  Follow Up Recommendations  Skilled nursing-short term rehab (<3 hours/day) Can patient physically be transported by private vehicle: Yes   Assistance Recommended at Discharge Frequent or constant Supervision/Assistance  Patient can return home with the following A little help with walking and/or  transfers;A little help with bathing/dressing/bathroom;Assistance with cooking/housework;Help with stairs or ramp for entrance   Equipment Recommendations  Rolling walker (2 wheels);BSC/3in1    Recommendations for Other Services       Precautions / Restrictions Precautions Precautions: Fall Restrictions Weight Bearing Restrictions: No     Mobility  Bed Mobility                    Transfers Overall transfer level: Needs assistance Equipment used: 1 person hand held assist Transfers: Sit to/from Stand, Bed to chair/wheelchair/BSC Sit to Stand: Mod assist Stand pivot transfers: Mod assist         General transfer comment: Poor coordination, ataxic, and tremorous    Ambulation/Gait Ambulation/Gait assistance: Mod assist Gait Distance (Feet):  (15) Assistive device: 2 person hand held assist (Pt leaning on furniture) Gait Pattern/deviations: Ataxic, Staggering left, Staggering right, Trunk flexed, Wide base of support       General Gait Details: High fall risk, poor balance, impulsive   Stairs             Wheelchair Mobility    Modified Rankin (Stroke Patients Only)       Balance Overall balance assessment: Needs assistance Sitting-balance support: No upper extremity supported, Feet supported Sitting balance-Leahy Scale: Fair     Standing balance support: Bilateral upper extremity supported, During functional activity Standing balance-Leahy Scale: Poor                              Cognition Arousal/Alertness: Awake/alert Behavior During Therapy: Impulsive Overall Cognitive Status: Impaired/Different from baseline Area of Impairment: Orientation, Attention, Memory, Following commands, Safety/judgement, Awareness, Problem solving  Orientation Level: Disoriented to, Place, Time, Situation Current Attention Level: Alternating Memory: Decreased recall of precautions, Decreased short-term memory Following  Commands: Follows one step commands inconsistently Safety/Judgement: Decreased awareness of safety, Decreased awareness of deficits Awareness: Emergent Problem Solving: Slow processing, Decreased initiation, Difficulty sequencing, Requires verbal cues, Requires tactile cues General Comments: Pt with significant change in mental status compared to 2 days prior        Exercises      General Comments General comments (skin integrity, edema, etc.): Pt rambling on about past events, unable to understand pt fully, also noted hallucinations and pt talking to absent individuals.      Pertinent Vitals/Pain Pain Assessment Pain Assessment: No/denies pain    Home Living                          Prior Function            PT Goals (current goals can now be found in the care plan section)      Frequency    Min 2X/week      PT Plan Current plan remains appropriate    Co-evaluation              AM-PAC PT "6 Clicks" Mobility   Outcome Measure  Help needed turning from your back to your side while in a flat bed without using bedrails?: None Help needed moving from lying on your back to sitting on the side of a flat bed without using bedrails?: A Little Help needed moving to and from a bed to a chair (including a wheelchair)?: A Lot Help needed standing up from a chair using your arms (e.g., wheelchair or bedside chair)?: A Lot Help needed to walk in hospital room?: A Lot Help needed climbing 3-5 steps with a railing? : A Lot 6 Click Score: 15    End of Session   Activity Tolerance: Other (comment) (Limited due to change in mental status) Patient left: in chair;with call bell/phone within reach;with chair alarm set;with family/visitor present;with nursing/sitter in room Nurse Communication: Mobility status (Concerns regarding increased confusion) PT Visit Diagnosis: Other abnormalities of gait and mobility (R26.89);Muscle weakness (generalized) (M62.81);History  of falling (Z91.81);Repeated falls (R29.6);Pain     Time: 1315-1340 PT Time Calculation (min) (ACUTE ONLY): 25 min  Charges:  $Gait Training: 8-22 mins $Therapeutic Activity: 8-22 mins                    Mikel Cella, PTA   [name] 10/11/2022, 3:52 PM

## 2022-10-11 NOTE — Care Management Important Message (Signed)
Important Message  Patient Details  Name: Donna Evans MRN: [id number] Date of Birth: [birth year]   Medicare Important Message Given:  Yes     Dannette Barbara 10/11/2022, 11:23 AM

## 2022-10-11 NOTE — Progress Notes (Signed)
Occupational Therapy Treatment Patient Details Name: Donna Evans MRN: [id number] DOB: [birth year] Today's Date: 10/11/2022   History of present illness Pt is a 76 y.o. female s/p unwitnessed fall; confusion and difficulty understanding pt's speech noted.  Per chart pt has been having trouble with balance and frequent falls lately.  Pt admitted with acute encephalopathy, ground level fall, B LE lymphedema, and elevated LFT's.  Multiple chronic R rib fx's noted on imaging.  PMH includes htn, prior CVA (unknown deficits), GERD, breast CA s/p mastectomy, chronic pain syndrome, CKD stage IIIa, COPD, SLE, depression with anxiety, migraine headache, fibromyalgia, B foot fx, peripheral neuropathy, L humeral IM nail 12/31/2019.   OT comments  Donna Evans was seen for OT treatment on this date. Upon arrival to room pt seated in chair, agreeable to tx. Pt requires MOD A don socks seated EOB - assist for sequencing and attention to task (pt identifies spots on ground as bugs). MIN A + step by step cues for tooth brushing and hand washing tasks. MIN A + HHA for ADL t/f. Pt making good progress toward goals, will continue to follow POC. Discharge recommendation remains appropriate.     Recommendations for follow up therapy are one component of a multi-disciplinary discharge planning process, led by the attending physician.  Recommendations may be updated based on patient status, additional functional criteria and insurance authorization.    Follow Up Recommendations  Skilled nursing-short term rehab (<3 hours/day)    Assistance Recommended at Discharge Frequent or constant Supervision/Assistance  Patient can return home with the following  A little help with walking and/or transfers;A little help with bathing/dressing/bathroom;Assistance with cooking/housework;Direct supervision/assist for medications management;Direct supervision/assist for financial management   Equipment Recommendations  Other (comment)  (defer)    Recommendations for Other Services      Precautions / Restrictions Precautions Precautions: Fall Restrictions Weight Bearing Restrictions: No       Mobility Bed Mobility               General bed mobility comments: received and left sitting    Transfers Overall transfer level: Needs assistance Equipment used: 1 person hand held assist, Rolling walker (2 wheels) Transfers: Sit to/from Stand Sit to Stand: Min assist                 Balance Overall balance assessment: Needs assistance Sitting-balance support: No upper extremity supported, Feet supported Sitting balance-Leahy Scale: Fair     Standing balance support: During functional activity, No upper extremity supported Standing balance-Leahy Scale: Fair                             ADL either performed or assessed with clinical judgement   ADL Overall ADL's : Needs assistance/impaired                                       General ADL Comments: MOD A don socks seated EOB - assist for sequencing and attention to task (pt identifies spots on ground as bugs). MIN A + step by step cues for tooth brushing and hand washing tasks. MIN A + HHA for ADL t/f      Cognition Arousal/Alertness: Awake/alert Behavior During Therapy: Impulsive Overall Cognitive Status: Impaired/Different from baseline Area of Impairment: Orientation, Attention, Memory, Following commands, Safety/judgement, Awareness, Problem solving  Orientation Level: Disoriented to, Place, Time, Situation Current Attention Level: Alternating Memory: Decreased recall of precautions, Decreased short-term memory Following Commands: Follows one step commands inconsistently Safety/Judgement: Decreased awareness of safety, Decreased awareness of deficits   Problem Solving: Slow processing, Decreased initiation, Difficulty sequencing, Requires verbal cues, Requires tactile cues General Comments:  Pt states year as 2023 however age as 25, [place], and does not identify individuals in room correctly. Requires constant redirection              General Comments Pt rambling on about past events, unable to understand pt fully, also noted hallucinations and pt talking to absent individuals.    Pertinent Vitals/ Pain       Pain Assessment Pain Assessment: No/denies pain   Frequency  Min 2X/week        Progress Toward Goals  OT Goals(current goals can now be found in the care plan section)  Progress towards OT goals: Progressing toward goals  Acute Rehab OT Goals Patient Stated Goal: unable to state OT Goal Formulation: With patient Time For Goal Achievement: 10/21/22 Potential to Achieve Goals: Good ADL Goals Pt Will Perform Grooming: with supervision;sitting Pt Will Perform Lower Body Dressing: with supervision;sitting/lateral leans Pt Will Transfer to Toilet: with supervision Pt Will Perform Toileting - Clothing Manipulation and hygiene: with supervision  Plan Discharge plan remains appropriate;Frequency remains appropriate    Co-evaluation                 AM-PAC OT "6 Clicks" Daily Activity     Outcome Measure   Help from another person eating meals?: A Little Help from another person taking care of personal grooming?: A Little Help from another person toileting, which includes using toliet, bedpan, or urinal?: A Lot Help from another person bathing (including washing, rinsing, drying)?: A Lot Help from another person to put on and taking off regular upper body clothing?: A Little Help from another person to put on and taking off regular lower body clothing?: A Lot 6 Click Score: 15    End of Session    OT Visit Diagnosis: Unsteadiness on feet (R26.81);Muscle weakness (generalized) (M62.81);Repeated falls (R29.6)   Activity Tolerance Patient tolerated treatment well   Patient Left in chair;with call bell/phone within reach;with chair  alarm set;with family/visitor present   Nurse Communication          Time: 7106-2694 OT Time Calculation (min): 17 min  Charges: OT General Charges $OT Visit: 1 Visit OT Treatments $Self Care/Home Management : 8-22 mins  Dessie Coma, M.S. OTR/L  10/11/22, 4:31 PM  ascom [phone]

## 2022-10-11 NOTE — Plan of Care (Signed)
  Problem: Clinical Measurements: Goal: Cardiovascular complication will be avoided Outcome: Progressing   Problem: Activity: Goal: Risk for activity intolerance will decrease Outcome: Progressing   Problem: Nutrition: Goal: Adequate nutrition will be maintained Outcome: Progressing   Problem: Elimination: Goal: Will not experience complications related to bowel motility Outcome: Progressing Goal: Will not experience complications related to urinary retention Outcome: Progressing   Problem: Pain Managment: Goal: General experience of comfort will improve Outcome: Progressing   Problem: Health Behavior/Discharge Planning: Goal: Ability to manage health-related needs will improve Outcome: Progressing

## 2022-10-11 NOTE — Plan of Care (Signed)

## 2022-10-11 NOTE — Progress Notes (Signed)
PROGRESS NOTE  CARENA STREAM    DOB: [birth year], 76 y.o.  WUJ:[id number]    Code Status: DNR   DOA: 10/06/2022   LOS: 3   Brief hospital course  Donna Evans is a 76 y.o. female with a PMH significant for hypertension, CVA, depression with anxiety, migraine headache, right breast cancer s/p mastectomy, chronic pain syndrome, HFpEF, CKD stage IIIa, COPD, SLE.  They presented from home to the ED on 10/06/2022 with period of unwitnessed unconsciousness less than a day which she does not recall. She was found on the floor by a friend and was speaking incoherently.   In the ED, it was found that they had 169/76 with heart rate of 70.  She was hypothermic at 95.3. Initial work-up remarkable for normal CBC, lactic acid, troponin, and CK.  CMP notable for potassium of 3.1, AST of 15, alkaline phosphatase of 638, total bili of 1.3.  CT head was obtained that did not show any acute abnormality other than soft tissue swelling over the left parietal scalp. Brain MRI negative for acute infarct.   They were initially treated with Abx and IV fluids as well as electrolyte replacement.   Patient was admitted to medicine service for further workup and management of AMS as outlined in detail below.  10/11/22 -patient is agreeable to SNF. Insurance has denied. Family plans to appeal and I agree with that since patient is not appropriate to go home at this time.   Assessment & Plan  Principal Problem:   Acute encephalopathy Active Problems:   Ground-level fall   Lymphedema   Elevated LFTs   Altered mental status   Bilateral leg edema   Community acquired pneumonia of left lower lobe of lung   Head injury   Delirium due to another medical condition  Acute encephalopathy Patient presenting with 1 day history of encephalopathy with ground level fall.  Unclear if there was a syncopal episode.  Per chart review, patient has had similar previous admissions for both falls and syncope.  CT head and MRI  obtained with no evidence of acute ischemia.  No electrolyte abnormalities to explain presentation.  Although patient's LFTs are elevated, she has no known history of cirrhosis or alcohol use.  Previous EEG obtained in the setting of syncope was negative. Repeat EEG negative. Troponins and CK negative. No PE or DVT.  Primary differential this morning was medication interaction/unintended effects since patient was pain free and coherent in our interactions. When I went back to revisit and update friends this afternoon, patient was having delusions and tangential speech which was noticed by SLP and confirmed as different from her baseline by her friend at baseline. She was simultaneously oriented x4. Ordered repeat stat head CT and notified neurology of her change in status. - Neurology consulted and has since signed off. No further recommendations.  - Frequent neurochecks, re-orientation - B12 ordered - continue to hold/decrease sedating medications as able - Holding home Flexeril and Ambien - PT/OT recommending SNF at dc, TOC engaged. - delirium precautions  H/o falls and balance issues H/o "memory change"   Elevated LFTs Per chart review, patient has a history of elevated alkaline phosphatase dating back to 2014 with new elevation in AST dating back to August 13, 2022 that is improving.  No known alcohol use. GGT elevated. RUQ [place] significant for diffuse liver increased echogenicity could be sign of fatty liver vs viral infection. Hepatitis panel pending.  HTN  HLD  CHF  H/o pelvic  and radial fractures from previous fall   Body mass index is 23.48 kg/m.  VTE ppx: enoxaparin (LOVENOX) injection 40 mg Start: 10/06/22 2200   Diet:     Diet   Diet regular Room service appropriate? Yes with Assist; Fluid consistency: Thin   Consultants: Neurology   Subjective 10/11/22    Pt reports she is agreeable to SNF. She feels unsteady on her feet. She is now worried about going home.     Objective   Vitals:   10/10/22 2335 10/11/22 0759 10/11/22 0815 10/11/22 1330  BP: (!) 165/85 (!) 190/87 (!) 186/91 (!) 174/85  Pulse: 60 89  88  Resp: 16     Temp: 98.1 F (36.7 C) 98.2 F (36.8 C)    TempSrc:      SpO2: 96% 96%  97%  Weight:      Height:        Intake/Output Summary (Last 24 hours) at 10/11/2022 1613 Last data filed at 10/11/2022 0641 Gross per 24 hour  Intake --  Output 0 ml  Net 0 ml    Filed Weights   10/06/22 1312  Weight: 64 kg     Physical Exam:  General: awake, alert, NAD HEENT: atraumatic, clear conjunctiva, anicteric sclera, MMM, hard of hearing  Respiratory: normal respiratory effort. Cardiovascular: quick capillary refill, normal S1/S2, RRR, no JVD, murmurs Gastrointestinal: soft, NT, ND Nervous: A&O x3. no gross focal neurologic deficits, dysarthria present Extremities: moves all equally, no edema, normal tone Skin: dry, intact, normal temperature, normal color. No rashes, lesions or ulcers on exposed skin Psychiatry: normal mood. Speech is more clear and congruent today.  Labs   I have personally reviewed the following labs and imaging studies CBC    Component Value Date/Time   WBC 5.6 10/08/2022 0615   RBC 3.20 (L) 10/08/2022 0615   HGB 9.7 (L) 10/08/2022 0615   HGB 11.7 (L) 02/29/2012 0057   HCT 30.5 (L) 10/08/2022 0615   HCT 34.8 (L) 02/29/2012 0057   PLT 179 10/08/2022 0615   PLT 193 02/29/2012 0057   MCV 95.3 10/08/2022 0615   MCV 90 02/29/2012 0057   MCH 30.3 10/08/2022 0615   MCHC 31.8 10/08/2022 0615   RDW 14.4 10/08/2022 0615   RDW 14.2 02/29/2012 0057   LYMPHSABS 1.0 10/06/2022 1322   LYMPHSABS 2.3 02/29/2012 0057   MONOABS 0.6 10/06/2022 1322   MONOABS 0.5 02/29/2012 0057   EOSABS 0.2 10/06/2022 1322   EOSABS 0.4 02/29/2012 0057   BASOSABS 0.0 10/06/2022 1322   BASOSABS 0.0 02/29/2012 0057      Latest Ref Rng & Units 10/08/2022    6:15 AM 10/07/2022    4:50 AM 10/06/2022    1:22 PM  BMP  Glucose 70  - 99 mg/dL 83  74  122   BUN 8 - 23 mg/dL '14  19  21   '$ Creatinine 0.44 - 1.00 mg/dL 1.09  1.02  1.00   Sodium 135 - 145 mmol/L 142  145  143   Potassium 3.5 - 5.1 mmol/L 3.6  3.3  3.1   Chloride 98 - 111 mmol/L 110  114  107   CO2 22 - 32 mmol/L '24  24  28   '$ Calcium 8.9 - 10.3 mg/dL 8.1  7.9  8.7     No results found.  Disposition Plan & Communication  Patient status: Inpatient  Admitted From: Home Planned disposition location: Skilled nursing facility Anticipated discharge date: TBD pending insurance appeal  Family  Communication: nephew on phone   Author: Richarda Osmond, DO [facility] 10/11/2022, 4:13 PM   Available by Epic secure chat 7AM-7PM. If 7PM-7AM, please contact night-coverage.  [facility] contact information found on [email or web address].

## 2022-10-11 NOTE — Plan of Care (Signed)
  Problem: Pain Managment: Goal: General experience of comfort will improve Outcome: Progressing   Problem: Safety: Goal: Ability to remain free from injury will improve Outcome: Progressing   Problem: Skin Integrity: Goal: Risk for impaired skin integrity will decrease Outcome: Progressing   

## 2022-10-11 NOTE — Progress Notes (Signed)
Patient with minimal oral intake today, even with encouragement.  Patient would not eat meals other than one bite of supper.  Patient has been up to [facility] multiple times to encourage voiding but unable to urinate; patient does not seem to understand what to do once she is on the commode despite coaching.  Patient did have one episode of incontinence.  Continues to have difficulty getting up and down to [facility]; unable to get patient to understand cues to turn to sit down when transferring.

## 2022-10-12 DIAGNOSIS — R4182 Altered mental status, unspecified: Secondary | ICD-10-CM | POA: Diagnosis not present

## 2022-10-12 DIAGNOSIS — I129 Hypertensive chronic kidney disease with stage 1 through stage 4 chronic kidney disease, or unspecified chronic kidney disease: Secondary | ICD-10-CM | POA: Diagnosis not present

## 2022-10-12 DIAGNOSIS — G9340 Encephalopathy, unspecified: Secondary | ICD-10-CM | POA: Diagnosis not present

## 2022-10-12 DIAGNOSIS — N189 Chronic kidney disease, unspecified: Secondary | ICD-10-CM | POA: Diagnosis not present

## 2022-10-12 DIAGNOSIS — F05 Delirium due to known physiological condition: Secondary | ICD-10-CM | POA: Diagnosis not present

## 2022-10-12 DIAGNOSIS — E785 Hyperlipidemia, unspecified: Secondary | ICD-10-CM | POA: Diagnosis not present

## 2022-10-12 DIAGNOSIS — J189 Pneumonia, unspecified organism: Secondary | ICD-10-CM | POA: Diagnosis not present

## 2022-10-12 LAB — BASIC METABOLIC PANEL
Anion gap: 11 (ref 5–15)
BUN: 20 mg/dL (ref 8–23)
CO2: 27 mmol/L (ref 22–32)
Calcium: 8.5 mg/dL — ABNORMAL LOW (ref 8.9–10.3)
Chloride: 97 mmol/L — ABNORMAL LOW (ref 98–111)
Creatinine, Ser: 1.32 mg/dL — ABNORMAL HIGH (ref 0.44–1.00)
GFR, Estimated: 42 mL/min — ABNORMAL LOW (ref >60)
Glucose, Bld: 87 mg/dL (ref 70–99)
Potassium: 3 mmol/L — ABNORMAL LOW (ref 3.5–5.1)
Sodium: 135 mmol/L (ref 135–145)

## 2022-10-12 LAB — CBC
HCT: 36.9 % (ref 36.0–46.0)
Hemoglobin: 12.2 g/dL (ref 12.0–15.0)
MCH: 30.3 pg (ref 26.0–34.0)
MCHC: 33.1 g/dL (ref 30.0–36.0)
MCV: 91.8 fL (ref 80.0–100.0)
Platelets: 207 10*3/uL (ref 150–400)
RBC: 4.02 MIL/uL (ref 3.87–5.11)
RDW: 14.3 % (ref 11.5–15.5)
WBC: 8 10*3/uL (ref 4.0–10.5)
nRBC: 0 % (ref 0.0–0.2)

## 2022-10-12 LAB — PROTEIN ELECTROPHORESIS, SERUM
A/G Ratio: 1 (ref 0.7–1.7)
Albumin ELP: 2.6 g/dL — ABNORMAL LOW (ref 2.9–4.4)
Alpha-1-Globulin: 0.3 g/dL (ref 0.0–0.4)
Alpha-2-Globulin: 0.6 g/dL (ref 0.4–1.0)
Beta Globulin: 0.6 g/dL — ABNORMAL LOW (ref 0.7–1.3)
Gamma Globulin: 1.2 g/dL (ref 0.4–1.8)
Globulin, Total: 2.7 g/dL (ref 2.2–3.9)
Total Protein ELP: 5.3 g/dL — ABNORMAL LOW (ref 6.0–8.5)

## 2022-10-12 LAB — MAGNESIUM: Magnesium: 2.3 mg/dL (ref 1.7–2.4)

## 2022-10-12 LAB — COMPLEMENT, TOTAL: Compl, Total (CH50): 59 U/mL (ref >41)

## 2022-10-12 MED ORDER — POTASSIUM CHLORIDE CRYS ER 20 MEQ PO TBCR
40.0000 meq | EXTENDED_RELEASE_TABLET | Freq: Two times a day (BID) | ORAL | Status: AC
  Administered 2022-10-12 (×2): 40 meq via ORAL
  Filled 2022-10-12 (×2): qty 2

## 2022-10-12 NOTE — Progress Notes (Signed)
PROGRESS NOTE  Donna Evans    DOB: [birth year], 76 y.o.  EZM:[id number]    Code Status: DNR   DOA: 10/06/2022   LOS: 4   Brief hospital course  Donna Evans is a 76 y.o. female with a PMH significant for hypertension, CVA, depression with anxiety, migraine headache, right breast cancer s/p mastectomy, chronic pain syndrome, HFpEF, CKD stage IIIa, COPD, SLE.  They presented from home to the ED on 10/06/2022 with period of unwitnessed unconsciousness less than a day which she does not recall. She was found on the floor by a friend and was speaking incoherently.   In the ED, it was found that they had 169/76 with heart rate of 70.  She was hypothermic at 95.3. Initial work-up remarkable for normal CBC, lactic acid, troponin, and CK.  CMP notable for potassium of 3.1, AST of 15, alkaline phosphatase of 638, total bili of 1.3.  CT head was obtained that did not show any acute abnormality other than soft tissue swelling over the left parietal scalp. Brain MRI negative for acute infarct.   They were initially treated with Abx and IV fluids as well as electrolyte replacement.   Patient was admitted to medicine service for further workup and management of AMS as outlined in detail below.  10/12/22 -patient is agreeable to SNF. Insurance has denied. Family plans to appeal and I agree with that since patient is not appropriate to go home at this time.   Assessment & Plan  Principal Problem:   Acute encephalopathy Active Problems:   Ground-level fall   Lymphedema   Elevated LFTs   Altered mental status   Bilateral leg edema   Community acquired pneumonia of left lower lobe of lung   Head injury   Delirium due to another medical condition  Acute encephalopathy- persistent and fluctuating delirium. She has periods of disorientation and lack of capacity.  Mild to Moderate dementia- worsening.  Patient presenting with 1 day history of encephalopathy with ground level fall.  Unclear if there was  a syncopal episode.  Per chart review, patient has had similar previous admissions for both falls and syncope.  CT head and MRI obtained with no evidence of acute ischemia.  No electrolyte abnormalities to explain presentation. Previous EEG obtained in the setting of syncope was negative. Repeat EEG negative. Troponins and CK negative. No PE or DVT. LP studies overall unremarkable. Neurology evaluated and concluded overall confusion is delirium on dementia. - Neurology consulted and has since signed off. No further recommendations.  - Frequent neurochecks, re-orientation - B12 ordered - continue to hold/decrease sedating medications as able - Holding home Flexeril and Ambien - PT/OT recommending SNF at dc, TOC engaged. - delirium precautions  H/o falls and balance issues H/o pelvic and radial fractures from previous fall   Elevated LFTs Per chart review, patient has a history of elevated alkaline phosphatase dating back to 2014 with new elevation in AST dating back to August 13, 2022 that is improving.  No known alcohol use. GGT elevated. RUQ [place] significant for diffuse liver increased echogenicity could be sign of fatty liver vs viral infection. Hepatitis panel negative.  HTN  HLD- well controlled on home medications  CHF- euvolemic on exam  Body mass index is 23.48 kg/m.  VTE ppx: enoxaparin (LOVENOX) injection 40 mg Start: 10/06/22 2200  Diet:     Diet   Diet regular Room service appropriate? Yes with Assist; Fluid consistency: Thin   Consultants: Neurology   Subjective  10/12/22    Pt reports no complaints or questions this morning.    Objective   Vitals:   10/11/22 1330 10/11/22 1750 10/11/22 2337 10/12/22 0754  BP: (!) 174/85 (!) 143/92 136/84 125/87  Pulse: 88 88 96 78  Resp:  '16 18 16  '$ Temp:  98.7 F (37.1 C) 98.4 F (36.9 C) 97.9 F (36.6 C)  TempSrc:  Oral    SpO2: 97% 97% 97% 95%  Weight:      Height:        Intake/Output Summary (Last 24 hours) at  10/12/2022 0756 Last data filed at 10/11/2022 1800 Gross per 24 hour  Intake 200 ml  Output --  Net 200 ml    Filed Weights   10/06/22 1312  Weight: 64 kg     Physical Exam:  General: awake, alert, NAD HEENT: atraumatic, clear conjunctiva, anicteric sclera, MMM, hard of hearing  Respiratory: normal respiratory effort. Cardiovascular: quick capillary refill, normal S1/S2, RRR, no JVD, murmurs Gastrointestinal: soft, NT, ND Nervous: A&O x3. no gross focal neurologic deficits, dysarthria present Extremities: moves all equally, no edema, normal tone Skin: dry, intact, normal temperature, normal color. No rashes, lesions or ulcers on exposed skin Psychiatry: normal mood. Speech is at baseline.  Labs   I have personally reviewed the following labs and imaging studies CBC    Component Value Date/Time   WBC 5.6 10/08/2022 0615   RBC 3.20 (L) 10/08/2022 0615   HGB 9.7 (L) 10/08/2022 0615   HGB 11.7 (L) 02/29/2012 0057   HCT 30.5 (L) 10/08/2022 0615   HCT 34.8 (L) 02/29/2012 0057   PLT 179 10/08/2022 0615   PLT 193 02/29/2012 0057   MCV 95.3 10/08/2022 0615   MCV 90 02/29/2012 0057   MCH 30.3 10/08/2022 0615   MCHC 31.8 10/08/2022 0615   RDW 14.4 10/08/2022 0615   RDW 14.2 02/29/2012 0057   LYMPHSABS 1.0 10/06/2022 1322   LYMPHSABS 2.3 02/29/2012 0057   MONOABS 0.6 10/06/2022 1322   MONOABS 0.5 02/29/2012 0057   EOSABS 0.2 10/06/2022 1322   EOSABS 0.4 02/29/2012 0057   BASOSABS 0.0 10/06/2022 1322   BASOSABS 0.0 02/29/2012 0057      Latest Ref Rng & Units 10/08/2022    6:15 AM 10/07/2022    4:50 AM 10/06/2022    1:22 PM  BMP  Glucose 70 - 99 mg/dL 83  74  122   BUN 8 - 23 mg/dL '14  19  21   '$ Creatinine 0.44 - 1.00 mg/dL 1.09  1.02  1.00   Sodium 135 - 145 mmol/L 142  145  143   Potassium 3.5 - 5.1 mmol/L 3.6  3.3  3.1   Chloride 98 - 111 mmol/L 110  114  107   CO2 22 - 32 mmol/L '24  24  28   '$ Calcium 8.9 - 10.3 mg/dL 8.1  7.9  8.7     No results  found.  Disposition Plan & Communication  Patient status: Inpatient  Admitted From: Home Planned disposition location: Skilled nursing facility Anticipated discharge date: TBD pending insurance appeal  Family Communication: none   Author: Richarda Osmond, DO [facility] 10/12/2022, 7:56 AM   Available by Epic secure chat 7AM-7PM. If 7PM-7AM, please contact night-coverage.  [facility] contact information found on [email or web address].

## 2022-10-12 NOTE — TOC Progression Note (Signed)
Transition of Care [facility]) - Progression Note    Patient Details  Name: Donna Evans MRN: [id number] Date of Birth: 1945-12-15  Transition of Care [facility]) CM/SW [place], RN Phone Number: 10/12/2022, 3:16 PM  Clinical Narrative:     [place] [employer] the nephew and main contact at [phone], left a general voice mail asking for a call back, need to inquire if they have appealed the denial  Expected Discharge Plan: [place] Barriers to Discharge: Continued Medical Work up  Expected Discharge Plan and Services Expected Discharge Plan: [place] arrangements for the past 2 months: Single Family Home                           HH Arranged: RN, PT, OT [facility] Agency: [place] Date [place]: 10/08/22   Representative spoke with at [place]: [place]   Social Determinants of Health ([place]) Interventions    Readmission Risk Interventions     No data to display

## 2022-10-12 NOTE — Progress Notes (Signed)
Occupational Therapy Treatment Patient Details Name: Donna Evans MRN: [id number] DOB: Aug 07, 1946 Today's Date: 10/12/2022   History of present illness Pt is a 76 y.o. female s/p unwitnessed fall; confusion and difficulty understanding pt's speech noted.  Per chart pt has been having trouble with balance and frequent falls lately.  Pt admitted with acute encephalopathy, ground level fall, B LE lymphedema, and elevated LFT's.  Multiple chronic R rib fx's noted on imaging.  PMH includes htn, prior CVA (unknown deficits), GERD, breast CA s/p mastectomy, chronic pain syndrome, CKD stage IIIa, COPD, SLE, depression with anxiety, migraine headache, fibromyalgia, B foot fx, peripheral neuropathy, L humeral IM nail 12/31/2019.   OT comments  Donna Evans was seen for OT treatment on this date. Upon arrival to room pt reclined in bed, family in room, agreeable to tx. Pt requires MIN A + RW for simulated toilet t/f, requires standing breathing break 2/2 anxiety. Pt unable to follow cues for hand placement - walker removed from view and instructed to stand with improved success. SETUP + SUPERVISION self-drinking seated EOB, pt drops cup when distracted by conversation. Pt making good progress toward goals, will continue to follow POC. Discharge recommendation remains appropriate.     Recommendations for follow up therapy are one component of a multi-disciplinary discharge planning process, led by the attending physician.  Recommendations may be updated based on patient status, additional functional criteria and insurance authorization.    Follow Up Recommendations  Skilled nursing-short term rehab (<3 hours/day)    Assistance Recommended at Discharge Frequent or constant Supervision/Assistance  Patient can return home with the following  A little help with walking and/or transfers;A little help with bathing/dressing/bathroom;Assistance with cooking/housework;Direct supervision/assist for medications  management;Direct supervision/assist for financial management   Equipment Recommendations  Other (comment) (defer)    Recommendations for Other Services      Precautions / Restrictions Precautions Precautions: Fall Restrictions Weight Bearing Restrictions: No       Mobility Bed Mobility Overal bed mobility: Needs Assistance Bed Mobility: Supine to Sit, Sit to Supine     Supine to sit: Supervision Sit to supine: Supervision        Transfers Overall transfer level: Needs assistance Equipment used: Rolling walker (2 wheels) Transfers: Sit to/from Stand Sit to Stand: Min assist           General transfer comment: pt stands pulling on RW and has total posterior LOB falling backward onto bed with poor safety awareness. Pt unable to follow cues for hand placement - walker removed from view and instructed to stand with improved success     Balance Overall balance assessment: Needs assistance Sitting-balance support: No upper extremity supported, Feet supported Sitting balance-Leahy Scale: Fair     Standing balance support: Bilateral upper extremity supported, During functional activity Standing balance-Leahy Scale: Fair                             ADL either performed or assessed with clinical judgement   ADL Overall ADL's : Needs assistance/impaired                                       General ADL Comments: MIN A + RW for simulated toilet t/f, requires standing breathing break 2/2 anxiety. SETUP + SUPERVISION self-drinking seated EOB, pt drops cup when distracted by conversation.  Cognition Arousal/Alertness: Awake/alert Behavior During Therapy: Impulsive, Anxious Overall Cognitive Status: Impaired/Different from baseline Area of Impairment: Orientation, Memory, Following commands, Safety/judgement, Problem solving                 Orientation Level: Disoriented to, Place, Situation   Memory: Decreased recall of  precautions, Decreased short-term memory Following Commands: Follows one step commands inconsistently Safety/Judgement: Decreased awareness of safety, Decreased awareness of deficits   Problem Solving: Slow processing, Decreased initiation, Difficulty sequencing, Requires verbal cues, Requires tactile cues General Comments: visual cues / demonstration. Pt babbles/rambles        Exercises Other Exercises Other Exercises: vison screening with notable R sided peripheral deficits            Pertinent Vitals/ Pain       Pain Assessment Pain Assessment: No/denies pain   Frequency  Min 2X/week        Progress Toward Goals  OT Goals(current goals can now be found in the care plan section)  Progress towards OT goals: Progressing toward goals  Acute Rehab OT Goals Patient Stated Goal: to go home OT Goal Formulation: With patient Time For Goal Achievement: 10/21/22 Potential to Achieve Goals: Good ADL Goals Pt Will Perform Grooming: with supervision;sitting Pt Will Perform Lower Body Dressing: with supervision;sitting/lateral leans Pt Will Transfer to Toilet: with supervision Pt Will Perform Toileting - Clothing Manipulation and hygiene: with supervision  Plan Discharge plan remains appropriate;Frequency remains appropriate    Co-evaluation                 AM-PAC OT "6 Clicks" Daily Activity     Outcome Measure   Help from another person eating meals?: A Little Help from another person taking care of personal grooming?: A Little Help from another person toileting, which includes using toliet, bedpan, or urinal?: A Lot Help from another person bathing (including washing, rinsing, drying)?: A Lot Help from another person to put on and taking off regular upper body clothing?: A Little Help from another person to put on and taking off regular lower body clothing?: A Lot 6 Click Score: 15    End of Session    OT Visit Diagnosis: Unsteadiness on feet  (R26.81);Muscle weakness (generalized) (M62.81);Repeated falls (R29.6)   Activity Tolerance Patient tolerated treatment well   Patient Left in bed;with call bell/phone within reach;with bed alarm set;with family/visitor present   Nurse Communication          Time: 3300-7622 OT Time Calculation (min): 23 min  Charges: OT General Charges $OT Visit: 1 Visit OT Treatments $Self Care/Home Management : 23-37 mins  Dessie Coma, M.S. OTR/L  10/12/22, 4:21 PM  ascom [phone]

## 2022-10-13 DIAGNOSIS — G9340 Encephalopathy, unspecified: Secondary | ICD-10-CM | POA: Diagnosis not present

## 2022-10-13 LAB — BASIC METABOLIC PANEL
Anion gap: 9 (ref 5–15)
BUN: 31 mg/dL — ABNORMAL HIGH (ref 8–23)
CO2: 30 mmol/L (ref 22–32)
Calcium: 8.6 mg/dL — ABNORMAL LOW (ref 8.9–10.3)
Chloride: 98 mmol/L (ref 98–111)
Creatinine, Ser: 1.57 mg/dL — ABNORMAL HIGH (ref 0.44–1.00)
GFR, Estimated: 34 mL/min — ABNORMAL LOW (ref >60)
Glucose, Bld: 91 mg/dL (ref 70–99)
Potassium: 3.3 mmol/L — ABNORMAL LOW (ref 3.5–5.1)
Sodium: 137 mmol/L (ref 135–145)

## 2022-10-13 LAB — OLIGOCLONAL BANDS, CSF + SERM

## 2022-10-13 NOTE — Progress Notes (Signed)
Physical Therapy Treatment Patient Details Name: Donna Evans MRN: [id number] DOB: 01/04/1946 Today's Date: 10/13/2022   History of Present Illness Pt is a 76 y.o. female s/p unwitnessed fall; confusion and difficulty understanding pt's speech noted.  Per chart pt has been having trouble with balance and frequent falls lately.  Pt admitted with acute encephalopathy, ground level fall, B LE lymphedema, and elevated LFT's.  Multiple chronic R rib fx's noted on imaging.  PMH includes htn, prior CVA (unknown deficits), GERD, breast CA s/p mastectomy, chronic pain syndrome, CKD stage IIIa, COPD, SLE, depression with anxiety, migraine headache, fibromyalgia, B foot fx, peripheral neuropathy, L humeral IM nail 12/31/2019.    PT Comments    Pt was pleasant and motivated to participate during the session and put forth good effort throughout. Pt required no physical assistance with bed mobility tasks this session but did need assist for stability with transfers and gait.  Pt reported no adverse symptoms during the session other than back pain with SpO2 and HR WNL on room air.  Session ended secondary to pt needing to have a BM with nursing notified that pt left on [facility] with call bell in reach.  Pt remains a very high risk for falls and would not be safe to return to her prior living situation at this time. Pt will benefit from PT services in a SNF setting upon discharge to safely address deficits listed in patient problem list for decreased caregiver assistance and eventual return to PLOF.     Recommendations for follow up therapy are one component of a multi-disciplinary discharge planning process, led by the attending physician.  Recommendations may be updated based on patient status, additional functional criteria and insurance authorization.  Follow Up Recommendations  Skilled nursing-short term rehab (<3 hours/day) Can patient physically be transported by private vehicle: Yes   Assistance Recommended at  Discharge Frequent or constant Supervision/Assistance  Patient can return home with the following A little help with walking and/or transfers;A little help with bathing/dressing/bathroom;Assistance with cooking/housework;Help with stairs or ramp for entrance;Assist for transportation   Equipment Recommendations  Rolling walker (2 wheels);BSC/3in1    Recommendations for Other Services       Precautions / Restrictions Precautions Precautions: Fall Restrictions Weight Bearing Restrictions: No     Mobility  Bed Mobility Overal bed mobility: Modified Independent             General bed mobility comments: Extra time and effort and use of bed rails    Transfers Overall transfer level: Needs assistance Equipment used: Rolling walker (2 wheels) Transfers: Sit to/from Stand Sit to Stand: Min assist           General transfer comment: Mod verbal cues for hand placement and min A for stability upon initial stand    Ambulation/Gait Ambulation/Gait assistance: Min assist Gait Distance (Feet): 3 Feet Assistive device: Rolling walker (2 wheels) Gait Pattern/deviations: Trunk flexed, Step-to pattern Gait velocity: decreased     General Gait Details: Min A for stability during several steps at the EOB   Stairs             Wheelchair Mobility    Modified Rankin (Stroke Patients Only)       Balance Overall balance assessment: Needs assistance Sitting-balance support: No upper extremity supported, Feet supported Sitting balance-Leahy Scale: Good     Standing balance support: Bilateral upper extremity supported, During functional activity Standing balance-Leahy Scale: Poor  Cognition Arousal/Alertness: Awake/alert Behavior During Therapy: WFL for tasks assessed/performed Overall Cognitive Status: No family/caregiver present to determine baseline cognitive functioning                                           Exercises Total Joint Exercises Ankle Circles/Pumps: AROM, Strengthening, Both, 5 reps, 10 reps Quad Sets: Strengthening, Both, 5 reps, 10 reps Long Arc Quad: Strengthening, Both, 10 reps Knee Flexion: Strengthening, Both, 10 reps    General Comments        Pertinent Vitals/Pain Pain Assessment Pain Assessment: 0-10 Pain Score: 4  Pain Location: back Pain Descriptors / Indicators: Sore Pain Intervention(s): Repositioned, Monitored during session, Premedicated before session    Home Living                          Prior Function            PT Goals (current goals can now be found in the care plan section) Progress towards PT goals: Progressing toward goals    Frequency    Min 2X/week      PT Plan Current plan remains appropriate    Co-evaluation              AM-PAC PT "6 Clicks" Mobility   Outcome Measure  Help needed turning from your back to your side while in a flat bed without using bedrails?: None Help needed moving from lying on your back to sitting on the side of a flat bed without using bedrails?: A Little Help needed moving to and from a bed to a chair (including a wheelchair)?: A Little Help needed standing up from a chair using your arms (e.g., wheelchair or bedside chair)?: A Little Help needed to walk in hospital room?: A Lot Help needed climbing 3-5 steps with a railing? : A Lot 6 Click Score: 17    End of Session Equipment Utilized During Treatment: Gait belt Activity Tolerance: Patient tolerated treatment well Patient left: Other (comment);with call bell/phone within reach (Pt left on [facility] for BM at end of session, nursing notified) Nurse Communication: Mobility status;Other (comment) (Pt left on [facility] for BM at end of session) PT Visit Diagnosis: Other abnormalities of gait and mobility (R26.89);Muscle weakness (generalized) (M62.81);History of falling (Z91.81);Repeated falls (R29.6);Pain Pain - part of body:  (back)      Time: 0102-7253 PT Time Calculation (min) (ACUTE ONLY): 16 min  Charges:  $Therapeutic Exercise: 8-22 mins                     D. Scott Yuna Pizzolato PT, DPT 10/13/22, 12:02 PM

## 2022-10-13 NOTE — Progress Notes (Signed)
PROGRESS NOTE    Donna Evans   SAY:[id number] DOB: 1946/09/16  DOA: 10/06/2022 Date of Service: 10/13/22 PCP: Jodi Marble, MD     Brief Narrative / Hospital Course:  Donna Evans is a 76 y.o. female with a PMH significant for hypertension, CVA, depression with anxiety, migraine headache, right breast cancer s/p mastectomy, chronic pain syndrome, HFpEF, CKD stage IIIa, COPD, SLE. Presented from home to the ED on 10/06/2022 with period of unwitnessed unconsciousness less than a day which she does not recall. She was found on the floor by a friend and was speaking incoherently.  In the ED, BP 169/76 with heart rate of 70.  She was hypothermic at 95.3. Initial work-up remarkable for normal CBC, lactic acid, troponin, and CK.  CMP notable for potassium of 3.1, AST of 15, alkaline phosphatase of 638, total bili of 1.3.  CT head was obtained that did not show any acute abnormality other than soft tissue swelling over the left parietal scalp. Brain MRI negative for acute infarct. Initially treated with Abx and IV fluids as well as electrolyte replacement. Patient was admitted to medicine service for further workup and management of AMS  Given history of previous similar episodes, normal imaging in ED, EEG was repeated and was also negative.  Allergy evaluated, concluded overall confusion but is delirium on dementia and have signed off.  Holding/decreasing sedating medications as able. 10/12/22 - patient is agreeable to SNF. Insurance has denied. Family plans to appeal and attending physician Dr Ouida Sills agreed with that - stating patient was not appropriate to go home at this time.  11/01 - remains significantly weak, still pending nephew calling insurance for appeal, see TOC note from today, the time sensitivity was stressed to him.   Consultants:  Neurology   Procedures: EEG      ASSESSMENT & PLAN:   Principal Problem:   Acute encephalopathy Active Problems:   Ground-level  fall   Lymphedema   Elevated LFTs   Altered mental status   Bilateral leg edema   Community acquired pneumonia of left lower lobe of lung   Head injury   Delirium due to another medical condition  Acute encephalopathy- persistent and fluctuating delirium. She has periods of disorientation and lack of capacity.  Mild to Moderate dementia- worsening.  H/o falls and balance issues H/o pelvic and radial fractures from previous fall Repeat EEG negative.  Neurology evaluated and concluded overall confusion is delirium on dementia. Neuro has signed off. No further recommendations.  Frequent neurochecks, re-orientation B12 ordered continue to hold/decrease sedating medications as able Holding home Flexeril and Ambien PT/OT recommending SNF at dc, TOC engaged. delirium precautions    Elevated LFTs - likely fatty liver  Per chart review, patient has a history of elevated alkaline phosphatase dating back to 2014 with new elevation in AST dating back to August 13, 2022 that is improving.  No known alcohol use.  GGT --> elevated.  RUQ [place] --> significant for diffuse liver increased echogenicity could be sign of fatty liver vs viral infection.  Hepatitis panel --> negative.   HTN  HLD well controlled on home medications   CHF euvolemic on exam   Body mass index is 23.48 kg/m.     DVT prophylaxis: lovenox Pertinent IV fluids/nutrition: no continuous IV fluids  Central lines / invasive devices: none  Code Status: DNR Family Communication: TOC spoke w/ son today   Disposition: remains inpatient, not safe to go home TOC needs: waiting on nephew  to appeal for SNF Barriers to discharge / significant pending items: waiting on nephew to appeal for SNF, if unable to arrange this will need to d/c home w/ HH              Subjective:  Patient reports doing well today, no new concerns.        Objective:  Vitals:   10/11/22 2337 10/12/22 0754 10/12/22 2046 10/13/22 0814   BP: 136/84 125/87 121/79 (!) 121/91  Pulse: 96 78 65 (!) 103  Resp: '18 16 19 14  '$ Temp: 98.4 F (36.9 C) 97.9 F (36.6 C) 98.1 F (36.7 C) 98.5 F (36.9 C)  TempSrc:   Oral   SpO2: 97% 95% 98% 100%  Weight:      Height:        Intake/Output Summary (Last 24 hours) at 10/13/2022 1447 Last data filed at 10/13/2022 1429 Gross per 24 hour  Intake 240 ml  Output --  Net 240 ml   Filed Weights   10/06/22 1312  Weight: 64 kg    Examination: Constitutional:  VS as above General Appearance: alert, well-developed, well-nourished, NAD Respiratory: Normal respiratory effort No wheeze No rhonchi No rales Cardiovascular: S1/S2 normal No murmur No rub/gallop auscultated No lower extremity edema Gastrointestinal: No tenderness Musculoskeletal:  No clubbing/cyanosis of digits Symmetrical movement in all extremities Neurological: No cranial nerve deficit on limited exam Alert Psychiatric: Normal judgment/insight Normal mood and affect       Scheduled Medications:   ALPRAZolam  0.5 mg Oral QHS   vitamin B-12  1,000 mcg Oral Daily   enoxaparin (LOVENOX) injection  40 mg Subcutaneous S01U   folic acid  1 mg Oral Daily   hydrochlorothiazide  12.5 mg Oral Daily   multivitamin with minerals  1 tablet Oral Daily   rosuvastatin  10 mg Oral Daily   sodium chloride flush  3 mL Intravenous Q12H    Continuous Infusions:   PRN Medications:  acetaminophen **OR** acetaminophen, HYDROcodone-acetaminophen, ondansetron **OR** ondansetron (ZOFRAN) IV  Antimicrobials:  Anti-infectives (From admission, onward)    Start     Dose/Rate Route Frequency Ordered Stop   10/06/22 1600  cefTRIAXone (ROCEPHIN) 1 g in sodium chloride 0.9 % 100 mL IVPB        1 g 200 mL/hr over 30 Minutes Intravenous  Once 10/06/22 1556 10/06/22 1637   10/06/22 1600  azithromycin (ZITHROMAX) 500 mg in sodium chloride 0.9 % 250 mL IVPB        500 mg 250 mL/hr over 60 Minutes Intravenous  Once 10/06/22  1556 10/06/22 1807       Data Reviewed: I have personally reviewed following labs and imaging studies  CBC: Recent Labs  Lab 10/07/22 0450 10/08/22 0615 10/12/22 1003  WBC 6.4 5.6 8.0  HGB 9.8* 9.7* 12.2  HCT 30.3* 30.5* 36.9  MCV 93.5 95.3 91.8  PLT 187 179 932   Basic Metabolic Panel: Recent Labs  Lab 10/07/22 0450 10/08/22 0615 10/12/22 1003 10/13/22 0613  NA 145 142 135 137  K 3.3* 3.6 3.0* 3.3*  CL 114* 110 97* 98  CO2 '24 24 27 30  '$ GLUCOSE 74 83 87 91  BUN '19 14 20 '$ 31*  CREATININE 1.02* 1.09* 1.32* 1.57*  CALCIUM 7.9* 8.1* 8.5* 8.6*  MG  --   --  2.3  --    GFR: Estimated Creatinine Clearance: 27.4 mL/min (A) (by C-G formula based on SCr of 1.57 mg/dL (H)). Liver Function Tests: Recent Labs  Lab  10/07/22 0450 10/08/22 0615 10/08/22 1530  AST 42* 41  --   ALT 20 19  --   ALKPHOS 468* 492*  --   BILITOT 1.2 1.0  --   PROT 5.4* 5.4*  --   ALBUMIN 2.5* 2.6* 2.7*   No results for input(s): "LIPASE", "AMYLASE" in the last 168 hours. Recent Labs  Lab 10/06/22 2153  AMMONIA 24   Coagulation Profile: Recent Labs  Lab 10/07/22 0450  INR 1.2   Cardiac Enzymes: No results for input(s): "CKTOTAL", "CKMB", "CKMBINDEX", "TROPONINI" in the last 168 hours. BNP (last 3 results) No results for input(s): "PROBNP" in the last 8760 hours. HbA1C: No results for input(s): "HGBA1C" in the last 72 hours. CBG: Recent Labs  Lab 10/09/22 1204 10/09/22 1635 10/10/22 0827 10/10/22 1250 10/10/22 1641  GLUCAP 119* 101* 79 89 87   Lipid Profile: No results for input(s): "CHOL", "HDL", "LDLCALC", "TRIG", "CHOLHDL", "LDLDIRECT" in the last 72 hours. Thyroid Function Tests: No results for input(s): "TSH", "T4TOTAL", "FREET4", "T3FREE", "THYROIDAB" in the last 72 hours. Anemia Panel: No results for input(s): "VITAMINB12", "FOLATE", "FERRITIN", "TIBC", "IRON", "RETICCTPCT" in the last 72 hours. Urine analysis:    Component Value Date/Time   COLORURINE YELLOW (A)  10/06/2022 1328   APPEARANCEUR CLEAR (A) 10/06/2022 1328   LABSPEC 1.013 10/06/2022 1328   PHURINE 7.0 10/06/2022 1328   GLUCOSEU NEGATIVE 10/06/2022 1328   HGBUR NEGATIVE 10/06/2022 [place] 10/06/2022 1328   BILIRUBINUR neg 11/10/2015 1352   KETONESUR NEGATIVE 10/06/2022 1328   PROTEINUR 30 (A) 10/06/2022 1328   UROBILINOGEN negative 11/10/2015 1352   UROBILINOGEN 0.2 11/20/2013 0201   NITRITE NEGATIVE 10/06/2022 1328   LEUKOCYTESUR NEGATIVE 10/06/2022 1328   Sepsis Labs: '@LABRCNTIP'$ (procalcitonin:4,lacticidven:4)  Recent Results (from the past 240 hour(s))  Blood Culture (routine x 2)     Status: None   Collection Time: 10/06/22  1:23 PM   Specimen: BLOOD  Result Value Ref Range Status   Specimen Description   Final    BLOOD Blood Culture results may not be optimal due to an inadequate volume of blood received in culture bottles   Special Requests   Final    BOTTLES DRAWN AEROBIC AND ANAEROBIC RIGHT ANTECUBITAL   Culture   Final    NO GROWTH 5 DAYS Performed at [facility], [address] [place], [place] [address]    Report Status 10/11/2022 FINAL  Final  Blood Culture (routine x 2)     Status: None   Collection Time: 10/06/22  1:24 PM   Specimen: BLOOD  Result Value Ref Range Status   Specimen Description BLOOD BLOOD LEFT ARM  Final   Special Requests   Final    BOTTLES DRAWN AEROBIC AND ANAEROBIC Blood Culture adequate volume   Culture   Final    NO GROWTH 5 DAYS Performed at [facility], [address] [place], [place] [address]    Report Status 10/11/2022 FINAL  Final  Urine Culture     Status: None   Collection Time: 10/06/22  1:28 PM   Specimen: Urine, Clean Catch  Result Value Ref Range Status   Specimen Description   Final    URINE, CLEAN CATCH Performed at [facility], [address], [place], [place] [address]    Special Requests   Final    NONE Performed at [facility], [address], [place], [place] [address]    Culture   Final    NO  GROWTH Performed at [facility], [place] [address], [place], [place] [address]    Report Status 10/07/2022 FINAL  Final  Resp Panel by RT-PCR (Flu A&B, Covid) Anterior Nasal Swab     Status: None   Collection Time: 10/06/22  4:14 PM   Specimen: Anterior Nasal Swab  Result Value Ref Range Status   SARS Coronavirus 2 by RT PCR NEGATIVE NEGATIVE Final    Comment: (NOTE) SARS-CoV-2 target nucleic acids are NOT DETECTED.  The SARS-CoV-2 RNA is generally detectable in upper respiratory specimens during the acute phase of infection. The lowest concentration of SARS-CoV-2 viral copies this assay can detect is 138 copies/mL. A negative result does not preclude SARS-Cov-2 infection and should not be used as the sole basis for treatment or other patient management decisions. A negative result may occur with  improper specimen collection/handling, submission of specimen other than nasopharyngeal swab, presence of viral mutation(s) within the areas targeted by this assay, and inadequate number of viral copies(<138 copies/mL). A negative result must be combined with clinical observations, patient history, and epidemiological information. The expected result is Negative.  Fact Sheet for Patients:  EntrepreneurPulse.com.au  Fact Sheet for Healthcare Providers:  IncredibleEmployment.be  This test is no t yet approved or cleared by the [place] FDA and  has been authorized for detection and/or diagnosis of SARS-CoV-2 by FDA under an Emergency Use Authorization (EUA). This EUA will remain  in effect (meaning this test can be used) for the duration of the COVID-19 declaration under Section 564(b)(1) of the Act, 21 U.S.C.section 360bbb-3(b)(1), unless the authorization is terminated  or revoked sooner.       Influenza A by PCR NEGATIVE NEGATIVE Final   Influenza B by PCR NEGATIVE NEGATIVE Final     Comment: (NOTE) The Xpert Xpress SARS-CoV-2/FLU/RSV plus assay is intended as an aid in the diagnosis of influenza from Nasopharyngeal swab specimens and should not be used as a sole basis for treatment. Nasal washings and aspirates are unacceptable for Xpert Xpress SARS-CoV-2/FLU/RSV testing.  Fact Sheet for Patients: EntrepreneurPulse.com.au  Fact Sheet for Healthcare Providers: IncredibleEmployment.be  This test is not yet approved or cleared by the [place] FDA and has been authorized for detection and/or diagnosis of SARS-CoV-2 by FDA under an Emergency Use Authorization (EUA). This EUA will remain in effect (meaning this test can be used) for the duration of the COVID-19 declaration under Section 564(b)(1) of the Act, 21 U.S.C. section 360bbb-3(b)(1), unless the authorization is terminated or revoked.  Performed at [facility], [address] [place], [place] [address]   CSF culture w Gram Stain     Status: None   Collection Time: 10/08/22  3:30 PM   Specimen: CSF; Cerebrospinal Fluid  Result Value Ref Range Status   Specimen Description   Final    CSF Performed at [facility], [address] [place], [place] [address]    Special Requests   Final    NONE Performed at [facility], [address] [place], [place] [address]    Gram Stain   Final    NO ORGANISMS SEEN WBC SEEN RED BLOOD CELLS SEEN Performed at [facility], [address], [place], [place] [address]    Culture   Final    NO GROWTH 3 DAYS Performed at [facility], [place] [address] [place], [place] [address]    Report Status 10/11/2022 FINAL  Final         Radiology  Studies: CT HEAD WO CONTRAST (5MM)  Result Date: 10/07/2022 CLINICAL DATA:  Altered mental status EXAM: CT HEAD WITHOUT CONTRAST TECHNIQUE: Contiguous axial images were obtained from the base of the skull through the vertex without  intravenous contrast. RADIATION DOSE REDUCTION: This exam was performed according to the departmental dose-optimization program which includes automated exposure control, adjustment of the mA and/or kV according to patient size and/or use of iterative reconstruction technique. COMPARISON:  10/06/2022 FINDINGS: Brain: No acute intracranial findings are seen. There are no signs of bleeding within the cranium. Ventricles are not dilated. Cortical sulci are prominent. There is decreased density in periventricular white matter. Vascular: Unremarkable. Skull: Unremarkable. Sinuses/Orbits: There is mucosal thickening in right maxillary sinus. There is mucosal thickening and frothy density in right side of sphenoid sinus. Other: None. IMPRESSION: No acute intracranial findings are seen in noncontrast CT brain. Atrophy. Small-vessel disease. Electronically Signed   By: Elmer Picker M.D.   On: 10/07/2022 15:51   EEG adult  Result Date: 10/07/2022 Lora Havens, MD     10/07/2022 12:28 PM Patient Name: RITU GAGLIARDO MRN: [id number] Epilepsy Attending: Lora Havens Referring Physician/Provider: Jose Persia, MD Date: 10/07/2022 Duration: 31.51 mins Patient history: 76 year old female with altered mental status.  EEG to eval for seizure. Level of alertness: Awake AEDs during EEG study: None Technical aspects: This EEG study was done with scalp electrodes positioned according to the 10-20 International system of electrode placement. Electrical activity was reviewed with band pass filter of 1-'70Hz'$ , sensitivity of 7 uV/mm, display speed of 66m/sec with a '60Hz'$  notched filter applied as appropriate. EEG data were recorded continuously and digitally stored.  Video monitoring was available and reviewed as appropriate. Description: The posterior dominant rhythm consists of 8-9 Hz activity of moderate voltage (25-35 uV) seen predominantly in posterior head regions, symmetric and reactive to eye opening and eye  closing. Physiologic photic driving was seen during photic stimulation. Hyperventilation was not performed.   IMPRESSION: This study is within normal limits. No seizures or epileptiform discharges were seen throughout the recording. A normal interictal EEG does not exclude the diagnosis of epilepsy. Priyanka OBarbra Sarks  [place]domen Limited RUQ (LIVER/GB)  Result Date: 10/07/2022 CLINICAL DATA:  Transaminitis EXAM: ULTRASOUND ABDOMEN LIMITED RIGHT UPPER QUADRANT COMPARISON:  CT abdomen pelvis 09/24/2009 FINDINGS: Gallbladder: Surgically absent Common bile duct: Diameter: 3 mm Liver: Parenchymal echogenicity: Mild diffuse increase echogenicity. Contours: Normal Lesions: None Portal vein: Patent.  Hepatopetal flow Other: None. IMPRESSION: Mild diffuse increased echogenicity of the hepatic parenchyma is a nonspecific indicator of hepatocellular dysfunction, most commonly steatosis. Electronically Signed   By: FMiachel RouxM.D.   On: 10/07/2022 09:36   [place]Venous Img Lower Bilateral (DVT)  Result Date: 10/07/2022 CLINICAL DATA:  Syncope EXAM: Bilateral lower Extremity Venous Doppler Ultrasound TECHNIQUE: Gray-scale sonography with compression, as well as color and duplex ultrasound, were performed to evaluate the deep venous system(s) from the level of the common femoral vein through the popliteal and proximal calf veins. COMPARISON:  04/29/2022 FINDINGS: VENOUS Normal compressibility of the common femoral, superficial femoral, and popliteal veins, as well as the visualized calf veins. Visualized portions of profunda femoral vein and great saphenous vein unremarkable. No filling defects to suggest DVT on grayscale or color Doppler imaging. Doppler waveforms show normal direction of venous flow, normal respiratory plasticity and response to augmentation. OTHER None. Limitations: none IMPRESSION: No lower extremity DVT. Electronically Signed   By: FMiachel RouxM.D.   On: 10/07/2022  09:34   DG Ribs Unilateral  Right  Result Date: 10/06/2022 CLINICAL DATA:  [place], right rib fractures EXAM: RIGHT RIBS - 2 VIEW COMPARISON:  Shoulder x-ray 10/06/2022, chest x-ray 10/06/2022 FINDINGS: Frontal and oblique views of the right thoracic cage are obtained. Right breast prosthesis obscures portions of the underlying ribs. Right chest is clear without airspace disease, effusion, or pneumothorax. There are chronic healed right posterior eighth and ninth rib fractures. 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 There are no acute displaced fractures. IMPRESSION: 1. Multiple chronic right rib fractures as above. No acute bony abnormality. Electronically Signed   By: Randa Ngo M.D.   On: 10/06/2022 19:47   MR BRAIN WO CONTRAST  Result Date: 10/06/2022 CLINICAL DATA:  Mental status change.  Fall today. EXAM: MRI HEAD WITHOUT CONTRAST TECHNIQUE: Multiplanar, multiecho pulse sequences of the brain and surrounding structures were obtained without intravenous contrast. COMPARISON:  CT head 10/06/2022 FINDINGS: Brain: Negative for acute infarct. Generalized mild atrophy. Chronic microvascular ischemic changes in the white matter. Pituitary overall normal in size. 5 mm T1 hyperintensity in the posterior pituitary best seen on sagittal T1 image 13. No suprasellar extension. Vascular: Normal arterial flow voids Skull and upper cervical spine: No focal skeletal lesion. Sinuses/Orbits: Mucosal edema paranasal sinuses. Air-fluid level right maxillary sinus. Bilateral cataract extraction Other: None IMPRESSION: 1. Negative for acute infarct. 2. Atrophy and chronic microvascular ischemic change in the white matter. 3. 5 mm T1 hyperintensity in the posterior pituitary. Possible Rathke's cleft cyst or pituitary adenoma. Slight enlargement since the prior MRI of 04/28/2022 Electronically Signed   By: Franchot Gallo M.D.   On: 10/06/2022 18:57   DG Shoulder Right  Result Date: 10/06/2022 CLINICAL DATA:  Right  shoulder pain EXAM: RIGHT SHOULDER - 2+ VIEW COMPARISON:  10/01/2014 FINDINGS: Healed fracture proximal right humerus. No acute fracture. Normal alignment. Multiple right rib fractures of indeterminate age. Recommend rib series of symptomatic. IMPRESSION: 1. No acute fracture of the shoulder. 2. Multiple right rib fractures of indeterminate age. Electronically Signed   By: Franchot Gallo M.D.   On: 10/06/2022 17:44   CT Head Wo Contrast  Result Date: 10/06/2022 CLINICAL DATA:  Acute stroke suspected EXAM: CT HEAD WITHOUT CONTRAST TECHNIQUE: Contiguous axial images were obtained from the base of the skull through the vertex without intravenous contrast. RADIATION DOSE REDUCTION: This exam was performed according to the departmental dose-optimization program which includes automated exposure control, adjustment of the mA and/or kV according to patient size and/or use of iterative reconstruction technique. COMPARISON:  CT Head 08/09/22 FINDINGS: Brain: No evidence of acute infarction, hemorrhage, hydrocephalus, extra-axial collection or mass lesion/mass effect. Vascular: No hyperdense vessel or unexpected calcification. Skull: Normal. Negative for fracture or focal lesion. Soft tissue swelling over the left parietal scalp Sinuses/Orbits: Bilateral lens replacement.Frothy secretions right maxillary sinus. Other: None. IMPRESSION: 1. No acute intracranial abnormality. 2. Soft tissue swelling over the left parietal scalp. No underlying fracture. 3. Frothy secretions right maxillary sinus. Correlate for acute sinusitis. Electronically Signed   By: Marin Roberts M.D.   On: 10/06/2022 15:51   DG Chest Port 1 View  Result Date: 10/06/2022 CLINICAL DATA:  Question sepsis.  Fell today. EXAM: PORTABLE CHEST 1 VIEW COMPARISON:  08/09/2022 FINDINGS: Artifact overlies the chest. Density related to bilateral breast implants. The right chest appears clear allowing for that. Abnormal density in the left lower chest could be  due to left lower lobe infiltrate/collapse/effusion. Old healed left  humeral fracture. IMPRESSION: Abnormal density in the left lower chest that could be due to left lower lobe infiltrate/collapse/effusion. Electronically Signed   By: Nelson Chimes M.D.   On: 10/06/2022 14:03            LOS: 5 days       Emeterio Reeve, DO [facility] 10/13/2022, 2:47 PM   Staff may message me via secure chat in [place]  but this may not receive immediate response,  please page for urgent matters!  If 7PM-7AM, please contact night-coverage [email or web address]  Dictation software was used to generate the above note. Typos may occur and escape review, as with typed/written notes. Please contact Dr Sheppard Coil directly for clarity if needed.

## 2022-10-13 NOTE — TOC Progression Note (Signed)
Transition of Care [facility]) - Progression Note    Patient Details  Name: Donna Evans MRN: [id number] Date of Birth: 1946/07/21  Transition of Care [facility]) CM/SW [place], RN Phone Number: 10/13/2022, 2:46 PM  Clinical Narrative:    Spoke with the patient's son, he had to fax the POA paper work to [employer] , they will contact him  Once they put in system dso that he can continue with the appeal of the denial  Expected Discharge Plan: [place] Barriers to Discharge: Continued Medical Work up  Expected Discharge Plan and Services Expected Discharge Plan: [place] arrangements for the past 2 months: Single Family Home                           HH Arranged: RN, PT, OT [facility] Agency: [place] Date [place]: 10/08/22   Representative spoke with at [place]: [place]   Social Determinants of Health ([place]) Interventions    Readmission Risk Interventions     No data to display

## 2022-10-13 NOTE — TOC Progression Note (Signed)
Transition of Care [facility]) - Progression Note    Patient Details  Name: Donna Evans MRN: [id number] Date of Birth: [birth year]  Transition of Care [facility]) CM/SW [place], RN Phone Number: 10/13/2022, 10:41 AM  Clinical Narrative:   Damaris Schooner with Ronalee Belts the patient's nephew and  Contact person, the patient lived alone prior to admission and wants to go to rehab to get stronger to return home He will be calling the insurance company today to do the appeal, I explained this is time sensitive and needs to be done today, he stated that he will definitely call today,    Expected Discharge Plan: [place] Barriers to Discharge: Continued Medical Work up  Expected Discharge Plan and Services Expected Discharge Plan: [place] arrangements for the past 2 months: Single Family Home                           HH Arranged: RN, PT, OT [facility] Agency: [place] Date [place]: 10/08/22   Representative spoke with at [place]: [place]   Social Determinants of Health ([place]) Interventions    Readmission Risk Interventions     No data to display

## 2022-10-13 NOTE — Plan of Care (Signed)

## 2022-10-13 NOTE — Hospital Course (Addendum)
Donna Evans is a 76 y.o. female with a PMH significant for hypertension, CVA, depression with anxiety, migraine headache, right breast cancer s/p mastectomy, chronic pain syndrome, HFpEF, CKD stage IIIa, COPD, SLE. Presented from home to the ED on 10/06/2022 with period of unwitnessed unconsciousness less than a day which she does not recall. She was found on the floor by a friend and was speaking incoherently.  In the ED, BP 169/76 with heart rate of 70.  She was hypothermic at 95.3. Initial work-up remarkable for normal CBC, lactic acid, troponin, and CK.  CMP notable for potassium of 3.1, AST of 15, alkaline phosphatase of 638, total bili of 1.3.  CT head was obtained that did not show any acute abnormality other than soft tissue swelling over the left parietal scalp. Brain MRI negative for acute infarct. Initially treated with Abx and IV fluids as well as electrolyte replacement. Patient was admitted to medicine service for further workup and management of AMS  Given history of previous similar episodes, normal imaging in ED, EEG was repeated and was also negative.  Allergy evaluated, concluded overall confusion but is delirium on dementia and have signed off.  Holding/decreasing sedating medications as able. 10/12/22 - patient is agreeable to SNF. Insurance has denied. Family plans to appeal and attending physician Dr Ouida Sills agreed with that - stating patient was not appropriate to go home at this time.  11/01 - remains significantly weak, still pending nephew calling insurance for appeal, see TOC note from today, the time sensitivity was stressed to him.  11/02: New Afib RVR - to progressive, trial diltiazem IV x1 --> did not convert, will need cardizem gtt and heparin gtt. Will repeat Echo. HR not responding, consulted cardiology Dr Clayborn Bigness advised metoprolol '5mg'$  IV Q15 minutes x 3 if heart rate > 120 and Bp >100. Metoprolol tartrate 25 mg po Q8 hr starting now. Consider amiodarone if still not  responding.   11/03: remains on cardizem gtt. HR remains elevated today.   Consultants:  Neurology   Procedures: EEG      ASSESSMENT & PLAN:   Principal Problem:   Acute encephalopathy Active Problems:   Ground-level fall   Lymphedema   Elevated LFTs   Altered mental status   Bilateral leg edema   Community acquired pneumonia of left lower lobe of lung   Head injury   Delirium due to another medical condition   Paroxysmal atrial fibrillation with RVR (HCC)  Paroxysmal atrial fibrillation with RVR ([place]) to progressive unit Diltiazem gtt heparin gtt.  Echo --> EF 55-60%, no RWMA, unable to eval diastolic fxn  Check troponin, TSH, BMP, Mg, CBC --> no concerns  Metoprolol tartrate 25 mg po Q8 hr  Cardiology following.    Acute encephalopathy- persistent and fluctuating delirium. She has periods of disorientation and lack of capacity.  Mild to Moderate dementia- worsening.  H/o falls and balance issues H/o pelvic and radial fractures from previous fall Repeat EEG negative.  Neurology evaluated and concluded overall confusion is delirium on dementia. Neuro has signed off. No further recommendations.  Frequent neurochecks, re-orientation B12 ordered continue to hold/decrease sedating medications as able Holding home Flexeril and Ambien PT/OT recommending SNF at dc, TOC engaged. delirium precautions    Elevated LFTs - likely fatty liver  Per chart review, patient has a history of elevated alkaline phosphatase dating back to 2014 with new elevation in AST dating back to August 13, 2022 that is improving.  No known alcohol use.  GGT -->  elevated.  RUQ [place] --> significant for diffuse liver increased echogenicity could be sign of fatty liver vs viral infection.  Hepatitis panel --> negative.   HTN  HLD well controlled on home medications   Hx HFrEF euvolemic on exam   Body mass index is 23.48 kg/m.     DVT prophylaxis: heparin gtt  Pertinent IV  fluids/nutrition: no continuous IV fluids  Central lines / invasive devices: none  Code Status: DNR Family Communication:   Disposition: remains inpatient, now Afib RVR  TOC needs: waiting on nephew to appeal for SNF Barriers to discharge / significant pending items: waiting on nephew to appeal for SNF, if unable to arrange this will need to d/c home w/ [facility] however new Afib will likely be here couple more days

## 2022-10-14 ENCOUNTER — Inpatient Hospital Stay
Admit: 2022-10-14 | Discharge: 2022-10-14 | Disposition: A | Discharge status: Home / Self Care | Payer: PPO | Attending: Osteopathic Medicine | Admitting: Osteopathic Medicine

## 2022-10-14 DIAGNOSIS — I480 Paroxysmal atrial fibrillation: Secondary | ICD-10-CM | POA: Insufficient documentation

## 2022-10-14 DIAGNOSIS — G9340 Encephalopathy, unspecified: Secondary | ICD-10-CM | POA: Diagnosis not present

## 2022-10-14 LAB — MAGNESIUM: Magnesium: 2.1 mg/dL (ref 1.7–2.4)

## 2022-10-14 LAB — CBC
HCT: 37.8 % (ref 36.0–46.0)
Hemoglobin: 12.5 g/dL (ref 12.0–15.0)
MCH: 30.6 pg (ref 26.0–34.0)
MCHC: 33.1 g/dL (ref 30.0–36.0)
MCV: 92.4 fL (ref 80.0–100.0)
Platelets: 239 10*3/uL (ref 150–400)
RBC: 4.09 MIL/uL (ref 3.87–5.11)
RDW: 14.2 % (ref 11.5–15.5)
WBC: 7.2 10*3/uL (ref 4.0–10.5)
nRBC: 0 % (ref 0.0–0.2)

## 2022-10-14 LAB — TROPONIN I (HIGH SENSITIVITY)
Troponin I (High Sensitivity): 35 ng/L — ABNORMAL HIGH (ref <18)
Troponin I (High Sensitivity): 41 ng/L — ABNORMAL HIGH (ref <18)

## 2022-10-14 LAB — BASIC METABOLIC PANEL
Anion gap: 13 (ref 5–15)
BUN: 31 mg/dL — ABNORMAL HIGH (ref 8–23)
CO2: 26 mmol/L (ref 22–32)
Calcium: 9 mg/dL (ref 8.9–10.3)
Chloride: 98 mmol/L (ref 98–111)
Creatinine, Ser: 1.34 mg/dL — ABNORMAL HIGH (ref 0.44–1.00)
GFR, Estimated: 41 mL/min — ABNORMAL LOW (ref >60)
Glucose, Bld: 99 mg/dL (ref 70–99)
Potassium: 3.9 mmol/L (ref 3.5–5.1)
Sodium: 137 mmol/L (ref 135–145)

## 2022-10-14 LAB — HEPARIN LEVEL (UNFRACTIONATED): Heparin Unfractionated: 0.76 IU/mL — ABNORMAL HIGH (ref 0.30–0.70)

## 2022-10-14 LAB — TSH: TSH: 2.732 u[IU]/mL (ref 0.350–4.500)

## 2022-10-14 MED ORDER — METOPROLOL TARTRATE 5 MG/5ML IV SOLN: 5.0000 mg | INTRAVENOUS | Status: DC | PRN

## 2022-10-14 MED ORDER — HEPARIN (PORCINE) 25000 UT/250ML-% IV SOLN
800.0000 [IU]/h | INTRAVENOUS | Status: DC
  Administered 2022-10-14: 950 [IU]/h via INTRAVENOUS
  Administered 2022-10-15: 800 [IU]/h via INTRAVENOUS
  Filled 2022-10-14 (×2): qty 250

## 2022-10-14 MED ORDER — DILTIAZEM HCL-DEXTROSE 125-5 MG/125ML-% IV SOLN (PREMIX)
5.0000 mg/h | INTRAVENOUS | Status: DC
  Administered 2022-10-14: 15 mg/h via INTRAVENOUS
  Administered 2022-10-14 – 2022-10-16 (×3): 5 mg/h via INTRAVENOUS
  Filled 2022-10-14 (×3): qty 125

## 2022-10-14 MED ORDER — METOPROLOL TARTRATE 25 MG PO TABS
25.0000 mg | ORAL_TABLET | Freq: Three times a day (TID) | ORAL | Status: DC
  Administered 2022-10-14 – 2022-10-17 (×9): 25 mg via ORAL
  Filled 2022-10-14 (×9): qty 1

## 2022-10-14 MED ORDER — HEPARIN BOLUS VIA INFUSION
4000.0000 [IU] | Freq: Once | INTRAVENOUS | Status: AC
  Administered 2022-10-14: 4000 [IU] via INTRAVENOUS
  Filled 2022-10-14: qty 4000

## 2022-10-14 MED ORDER — DILTIAZEM HCL 25 MG/5ML IV SOLN
10.0000 mg | Freq: Once | INTRAVENOUS | Status: AC
  Administered 2022-10-14: 10 mg via INTRAVENOUS
  Filled 2022-10-14: qty 5

## 2022-10-14 MED ORDER — METOPROLOL TARTRATE 5 MG/5ML IV SOLN
INTRAVENOUS | Status: AC
  Administered 2022-10-14: 5 mg
  Filled 2022-10-14: qty 5

## 2022-10-14 NOTE — Progress Notes (Signed)
*  PRELIMINARY RESULTS* Echocardiogram 2D Echocardiogram has been performed.  Donna Evans Needs 10/14/2022, 12:56 PM

## 2022-10-14 NOTE — Consult Note (Signed)
ANTICOAGULATION CONSULT NOTE  Pharmacy Consult for Heparin Indication: atrial fibrillation  Allergies  Allergen Reactions   Ciprofloxacin Nausea And Vomiting and Hives   Codeine Anaphylaxis and Hives    Reaction:  Unknown    Demerol [Meperidine] Anaphylaxis and Hives    Reaction:  Unknown     Fluconazole Hives   Latex Anaphylaxis and Rash   Morphine And Related Hives   Doxycycline Nausea And Vomiting   Flagyl [Metronidazole] Nausea And Vomiting    Reaction:  Unknown    Influenza Vaccines Swelling    Localized swelling   Tape Other (See Comments)    Pt allergic to Adhesive tape and latex.(  Skin breaks out)   Valtrex [Valacyclovir Hcl] Hives   Buprenorphine     Other reaction(s): Other (see comments) unknown   Duloxetine Hcl Other (See Comments)    Reaction:  Unknown    Tramadol     Other reaction(s): Not available   Amoxicillin-Pot Clavulanate Nausea And Vomiting   Sulfamethoxazole-Trimethoprim Nausea And Vomiting   Valacyclovir Rash    Patient Measurements: Height: '5\' 5"'$  (165.1 cm) Weight: 64 kg (141 lb 1.5 oz) IBW/kg (Calculated) : 57 Heparin Dosing Weight: 64 kg  Vital Signs: Temp: 98.8 F (37.1 C) (11/02 0714) BP: 122/97 (11/02 0714) Pulse Rate: 143 (11/02 1319)  Labs: Recent Labs    10/12/22 1003 10/13/22 0613  HGB 12.2  --   HCT 36.9  --   PLT 207  --   CREATININE 1.32* 1.57*    Estimated Creatinine Clearance: 27.4 mL/min (A) (by C-G formula based on SCr of 1.57 mg/dL (H)).   Medical History: Past Medical History:  Diagnosis Date   Allergic state    Anginal pain ([place])    Prinzmetal's angina   Anxiety    Arthritis    osteoarthritis   Breast cancer ([place]) 1990   right breast cancer   Chronic pain    Chronic pain    COPD (chronic obstructive pulmonary disease) (HCC)    Depression    Edema    Fibromyalgia    Foot fracture    Bilateral   GERD (gastroesophageal reflux disease)    History of kidney stones    Hypertension    Leg  fracture, right    Low back pain    Lumbosacral neuritis    Medulloadrenal hyperfunction (HCC)    Migraine headache    Peripheral neuropathy    Shoulder fracture, right    Stroke (HCC)    TIA   Systemic lupus erythematosus (HCC)    Thyroid disease    TIA (transient ischemic attack) 11/28/2013   Transient global amnesia 2011    Medications:  No history of chronic anticoagulation PTA  Assessment: Pharmacy has been consulted to initiate and titrate heparin in 76yo female who was initially admitted s/p unwitnessed fall. During hospital admission, patient developed atrial fibrillation with RVR(HR as high as 171). Patient was given Cardizem '10mg'$  IV x 1 with plans to transfer to the cardia progressive unit for closer monitoring. Baseline labs were previously drawn, deferring repeat levels.  Goal of Therapy:  Heparin level 0.3-0.7 units/ml Monitor platelets by anticoagulation protocol: Yes   Plan:  Give 4000 units bolus x 1 Start heparin infusion at 950 units/hr Check anti-Xa level in 8 hours and daily while on heparin Continue to monitor H&H and platelets  Genice Kimberlin A Marelly Wehrman 10/14/2022,1:46 PM

## 2022-10-14 NOTE — TOC Progression Note (Signed)
Transition of Care [facility]) - Progression Note    Patient Details  Name: Donna Evans MRN: [id number] Date of Birth: [birth year]  Transition of [facility]) CM/SW [place], RN Phone Number: 10/14/2022, 1:08 PM  Clinical Narrative:     Spoke with the patient's POA And Duane [place], he has initiated the Fast appeal with the Insurance company yesterday afternoon I explained that once the [place] reviews and if they approve they give a time frame to go to a facility, once medically ready she will discharge to the facility, if they deny then he can do a second level appeal, I explained that if that is also denied the choice is to go home or to pay out of pocket for rehab He asked what they do if they can not afford it, I explained that if the [place] deems she is not meeting criteria to pay for Rehab then once medically stable the only other option is to go home or to pay out of pocket, he stated understanding, I provided him with the nurses desk number to speak to them about her transferring to the 2nd floor    Expected Discharge Plan: [place] Barriers to Discharge: Continued Medical Work up  Expected Discharge Plan and Services Expected Discharge Plan: [place] arrangements for the past 2 months: Single Family Home                           HH Arranged: RN, PT, OT [facility] Agency: [place] Date [place]: 10/08/22   Representative spoke with at [place]: [place]   Social Determinants of Health ([place]) Interventions    Readmission Risk Interventions     No data to display

## 2022-10-14 NOTE — TOC Progression Note (Signed)
Transition of Care [facility]) - Progression Note    Patient Details  Name: Donna Evans MRN: [id number] Date of Birth: May 26, 1946  Transition [facility]) CM/SW [place], RN Phone Number: 10/14/2022, 12:21 PM  Clinical Narrative:    Called nephew Ronalee Belts to follow up on the appeal  Unable to reach, left a vm asking for a return call   Expected Discharge Plan: [place] Barriers to Discharge: Continued Medical Work up  Expected Discharge Plan and Services Expected Discharge Plan: [place] arrangements for the past 2 months: Single Family Home                           HH Arranged: RN, PT, OT [facility] Agency: [place] Date [place]: 10/08/22   Representative spoke with at [place]: [place]   Social Determinants of Health ([place]) Interventions    Readmission Risk Interventions     No data to display

## 2022-10-14 NOTE — Progress Notes (Addendum)
PROGRESS NOTE    Donna Evans   OJJ:[id number] DOB: 07/01/1946  DOA: 10/06/2022 Date of Service: 10/14/22 PCP: Donna Marble, MD     Brief Narrative / Hospital Course:  Donna Evans is a 76 y.o. female with a PMH significant for hypertension, CVA, depression with anxiety, migraine headache, right breast cancer s/p mastectomy, chronic pain syndrome, HFpEF, CKD stage IIIa, COPD, SLE. Presented from home to the ED on 10/06/2022 with period of unwitnessed unconsciousness less than a day which she does not recall. She was found on the floor by a Donna Evans and was speaking incoherently.  In the ED, BP 169/76 with heart rate of 70.  She was hypothermic at 95.3. Initial work-up remarkable for normal CBC, lactic acid, troponin, and CK.  CMP notable for potassium of 3.1, AST of 15, alkaline phosphatase of 638, total bili of 1.3.  CT head was obtained that did not show any acute abnormality other than soft tissue swelling over the left parietal scalp. Brain MRI negative for acute infarct. Initially treated with Abx and IV fluids as well as electrolyte replacement. Patient was admitted to medicine service for further workup and management of AMS  Given history of previous similar episodes, normal imaging in ED, EEG was repeated and was also negative.  Allergy evaluated, concluded overall confusion but is delirium on dementia and have signed off.  Holding/decreasing sedating medications as able. 10/12/22 - patient is agreeable to SNF. Insurance has denied. Family plans to appeal and attending physician Donna Evans agreed with that - stating patient was not appropriate to go home at this time.  11/01 - remains significantly weak, still pending Donna Evans calling insurance for appeal, see TOC note from today, the time sensitivity was stressed to him.  11/02: New Afib RVR - to progressive, trial diltiazem IV x1 --> did not convert, will need cardizem gtt and heparin gtt. Will repeat Echo. HR not responding,  consulted cardiology Donna Clayborn Bigness advised metoprolol '5mg'$  IV Q15 minutes x 3 if heart rate > 120 and Bp >100. Metoprolol tartrate 25 mg po Q8 hr starting now. Consider amiodarone if still not responding.    Consultants:  Neurology   Procedures: EEG      ASSESSMENT & PLAN:   Principal Problem:   Acute encephalopathy Active Problems:   Ground-level fall   Lymphedema   Elevated LFTs   Altered mental status   Bilateral leg edema   Community acquired pneumonia of left lower lobe of lung   Head injury   Delirium due to another medical condition   Paroxysmal atrial fibrillation with RVR (HCC)  Paroxysmal atrial fibrillation with RVR ([place]) to progressive unit trial diltiazem IV x1 --> did not convert, will need infusion  heparin gtt.  Echo Check troponin, TSH, BMP, Mg, CBC Trend troponin, likely demand ischemia will consult cardiology if significant upward trend  HR not responding to dilt drip, consulted cardiology Donna Clayborn Bigness advised metoprolol '5mg'$  IV Q15 minutes x 3 if heart rate > 120 and Bp >100. Metoprolol tartrate 25 mg po Q8 hr starting now. Consider amiodarone if still not responding.    Acute encephalopathy- persistent and fluctuating delirium. She has periods of disorientation and lack of capacity.  Mild to Moderate dementia- worsening.  H/o falls and balance issues H/o pelvic and radial fractures from previous fall Repeat EEG negative.  Neurology evaluated and concluded overall confusion is delirium on dementia. Neuro has signed off. No further recommendations.  Frequent neurochecks, re-orientation B12 ordered continue to hold/decrease  sedating medications as able Holding home Flexeril and Ambien PT/OT recommending SNF at dc, TOC engaged. delirium precautions    Elevated LFTs - likely fatty liver  Per chart review, patient has a history of elevated alkaline phosphatase dating back to 2014 with new elevation in AST dating back to August 13, 2022 that is improving.   No known alcohol use.  GGT --> elevated.  RUQ [place] --> significant for diffuse liver increased echogenicity could be sign of fatty liver vs viral infection.  Hepatitis panel --> negative.   HTN  HLD well controlled on home medications   Hx HFrEF euvolemic on exam   Body mass index is 23.48 kg/m.     DVT prophylaxis: lovenox --> heparin gtt  Pertinent IV fluids/nutrition: no continuous IV fluids  Central lines / invasive devices: none  Code Status: DNR Family Communication: TOC spoke w/ Donna Evans today   Disposition: remains inpatient, now Afib RVR  TOC needs: waiting on Donna Evans to appeal for SNF Barriers to discharge / significant pending items: waiting on Donna Evans to appeal for SNF, if unable to arrange this will need to d/c home w/ [facility] however new Afib will likely be here couple more days              Subjective:  Patient reports palpitations, see above. No CP/SOB, feels anxious (is in AFib RVR) she reports previous episodes of this few years ago on and off but no dx AFib.        Objective:  Vitals:   10/14/22 1513 10/14/22 1620 10/14/22 1630 10/14/22 1640  BP: (!) 133/93 (!) 150/115  (!) 143/102  Pulse: 82 (!) 38 64 (!) 101  Resp: 16 (!) 24 (!) 25 (!) 26  Temp: 98 F (36.7 C) 98.5 F (36.9 C)    TempSrc:      SpO2: 98% 98% 97% 95%  Weight:      Height:       No intake or output data in the 24 hours ending 10/14/22 1708  Filed Weights   10/06/22 1312  Weight: 64 kg    Examination: Constitutional:  VS as above General Appearance: alert, well-developed, well-nourished, NAD Respiratory: Normal respiratory effort No wheeze No rhonchi No rales Cardiovascular: Irreg irreg and tachycardic No lower extremity edema Gastrointestinal: No tenderness Musculoskeletal:  No clubbing/cyanosis of digits Symmetrical movement in all extremities Neurological: No cranial nerve deficit on limited exam Alert Psychiatric: Normal judgment/insight Normal mood  and affect       Scheduled Medications:   ALPRAZolam  0.5 mg Oral QHS   vitamin B-12  1,000 mcg Oral Daily   folic acid  1 mg Oral Daily   hydrochlorothiazide  12.5 mg Oral Daily   metoprolol tartrate  25 mg Oral Q8H   multivitamin with minerals  1 tablet Oral Daily   rosuvastatin  10 mg Oral Daily   sodium chloride flush  3 mL Intravenous Q12H    Continuous Infusions:  diltiazem (CARDIZEM) infusion 15 mg/hr (10/14/22 1632)   heparin 950 Units/hr (10/14/22 1434)    PRN Medications:  acetaminophen **OR** acetaminophen, HYDROcodone-acetaminophen, metoprolol tartrate, ondansetron **OR** ondansetron (ZOFRAN) IV  Antimicrobials:  Anti-infectives (From admission, onward)    Start     Dose/Rate Route Frequency Ordered Stop   10/06/22 1600  cefTRIAXone (ROCEPHIN) 1 g in sodium chloride 0.9 % 100 mL IVPB        1 g 200 mL/hr over 30 Minutes Intravenous  Once 10/06/22 1556 10/06/22 1637   10/06/22 1600  azithromycin (ZITHROMAX) 500 mg in sodium chloride 0.9 % 250 mL IVPB        500 mg 250 mL/hr over 60 Minutes Intravenous  Once 10/06/22 1556 10/06/22 1807       Data Reviewed: I have personally reviewed following labs and imaging studies  CBC: Recent Labs  Lab 10/08/22 0615 10/12/22 1003 10/14/22 1421  WBC 5.6 8.0 7.2  HGB 9.7* 12.2 12.5  HCT 30.5* 36.9 37.8  MCV 95.3 91.8 92.4  PLT 179 207 213   Basic Metabolic Panel: Recent Labs  Lab 10/08/22 0615 10/12/22 1003 10/13/22 0613 10/14/22 1421  NA 142 135 137  --   K 3.6 3.0* 3.3*  --   CL 110 97* 98  --   CO2 '24 27 30  '$ --   GLUCOSE 83 87 91  --   BUN 14 20 31*  --   CREATININE 1.09* 1.32* 1.57*  --   CALCIUM 8.1* 8.5* 8.6*  --   MG  --  2.3  --  2.1   GFR: Estimated Creatinine Clearance: 27.4 mL/min (A) (by C-G formula based on SCr of 1.57 mg/dL (H)). Liver Function Tests: Recent Labs  Lab 10/08/22 0615 10/08/22 1530  AST 41  --   ALT 19  --   ALKPHOS 492*  --   BILITOT 1.0  --   PROT 5.4*  --    ALBUMIN 2.6* 2.7*   No results for input(s): "LIPASE", "AMYLASE" in the last 168 hours. No results for input(s): "AMMONIA" in the last 168 hours.  Coagulation Profile: No results for input(s): "INR", "PROTIME" in the last 168 hours.  Cardiac Enzymes: No results for input(s): "CKTOTAL", "CKMB", "CKMBINDEX", "TROPONINI" in the last 168 hours. BNP (last 3 results) No results for input(s): "PROBNP" in the last 8760 hours. HbA1C: No results for input(s): "HGBA1C" in the last 72 hours. CBG: Recent Labs  Lab 10/09/22 1204 10/09/22 1635 10/10/22 0827 10/10/22 1250 10/10/22 1641  GLUCAP 119* 101* 79 89 87   Lipid Profile: No results for input(s): "CHOL", "HDL", "LDLCALC", "TRIG", "CHOLHDL", "LDLDIRECT" in the last 72 hours. Thyroid Function Tests: Recent Labs    10/14/22 1421  TSH 2.732   Anemia Panel: No results for input(s): "VITAMINB12", "FOLATE", "FERRITIN", "TIBC", "IRON", "RETICCTPCT" in the last 72 hours. Urine analysis:    Component Value Date/Time   COLORURINE YELLOW (A) 10/06/2022 1328   APPEARANCEUR CLEAR (A) 10/06/2022 1328   LABSPEC 1.013 10/06/2022 1328   PHURINE 7.0 10/06/2022 1328   GLUCOSEU NEGATIVE 10/06/2022 1328   HGBUR NEGATIVE 10/06/2022 [place] 10/06/2022 1328   BILIRUBINUR neg 11/10/2015 1352   KETONESUR NEGATIVE 10/06/2022 1328   PROTEINUR 30 (A) 10/06/2022 1328   UROBILINOGEN negative 11/10/2015 1352   UROBILINOGEN 0.2 11/20/2013 0201   NITRITE NEGATIVE 10/06/2022 1328   LEUKOCYTESUR NEGATIVE 10/06/2022 1328   Sepsis Labs: '@LABRCNTIP'$ (procalcitonin:4,lacticidven:4)  Recent Results (from the past 240 hour(s))  Blood Culture (routine x 2)     Status: None   Collection Time: 10/06/22  1:23 PM   Specimen: BLOOD  Result Value Ref Range Status   Specimen Description   Final    BLOOD Blood Culture results may not be optimal due to an inadequate volume of blood received in culture bottles   Special Requests   Final     BOTTLES DRAWN AEROBIC AND ANAEROBIC RIGHT ANTECUBITAL   Culture   Final    NO GROWTH 5 DAYS Performed at [facility], [address] [place], [place] [address]    Report Status 10/11/2022 FINAL  Final  Blood Culture (routine x 2)     Status: None   Collection Time: 10/06/22  1:24 PM   Specimen: BLOOD  Result Value Ref Range Status   Specimen Description BLOOD BLOOD LEFT ARM  Final   Special Requests   Final    BOTTLES DRAWN AEROBIC AND ANAEROBIC Blood Culture adequate volume   Culture   Final    NO GROWTH 5 DAYS Performed at [facility], [address], [place], [place] [address]    Report Status 10/11/2022 FINAL  Final  Urine Culture     Status: None   Collection Time: 10/06/22  1:28 PM   Specimen: Urine, Clean Catch  Result Value Ref Range Status   Specimen Description   Final    URINE, CLEAN CATCH Performed at [facility], [address] [place], [place] [address]    Special Requests   Final    NONE Performed at [facility], [address], [place], [place] [address]    Culture   Final    NO GROWTH Performed at [facility], [place] [address] [place],  [address]    Report Status 10/07/2022 FINAL  Final  Resp Panel by RT-PCR (Flu A&B, Covid) Anterior Nasal Swab     Status: None   Collection Time: 10/06/22  4:14 PM   Specimen: Anterior Nasal Swab  Result Value Ref Range Status   SARS Coronavirus 2 by RT PCR NEGATIVE NEGATIVE Final    Comment: (NOTE) SARS-CoV-2 target nucleic acids are NOT DETECTED.  The SARS-CoV-2 RNA is generally detectable in upper respiratory specimens during the acute phase of infection. The lowest concentration of SARS-CoV-2 viral copies this assay can detect is 138 copies/mL. A negative result does not preclude SARS-Cov-2 infection and should not be used as the sole basis for treatment or other patient management decisions. A negative result may occur with  improper specimen  collection/handling, submission of specimen other than nasopharyngeal swab, presence of viral mutation(s) within the areas targeted by this assay, and inadequate number of viral copies(<138 copies/mL). A negative result must be combined with clinical observations, patient history, and epidemiological information. The expected result is Negative.  Fact Sheet for Patients:  EntrepreneurPulse.com.au  Fact Sheet for Healthcare Providers:  IncredibleEmployment.be  This test is no t yet approved or cleared by the [place] FDA and  has been authorized for detection and/or diagnosis of SARS-CoV-2 by FDA under an Emergency Use Authorization (EUA). This EUA will remain  in effect (meaning this test can be used) for the duration of the COVID-19 declaration under Section 564(b)(1) of the Act, 21 U.S.C.section 360bbb-3(b)(1), unless the authorization is terminated  or revoked sooner.       Influenza A by PCR NEGATIVE NEGATIVE Final   Influenza B by PCR NEGATIVE NEGATIVE Final    Comment: (NOTE) The Xpert Xpress SARS-CoV-2/FLU/RSV plus assay is intended as an aid in the diagnosis of influenza from Nasopharyngeal swab specimens and should not be used as a sole basis for treatment. Nasal washings and aspirates are unacceptable for Xpert Xpress SARS-CoV-2/FLU/RSV testing.  Fact Sheet for Patients: EntrepreneurPulse.com.au  Fact Sheet for Healthcare Providers: IncredibleEmployment.be  This test is not yet approved or cleared by the [place] FDA and has been authorized for detection and/or diagnosis of SARS-CoV-2 by FDA under an Emergency Use Authorization (EUA). This EUA will remain in effect (meaning this test can be  used) for the duration of the COVID-19 declaration under Section 564(b)(1) of the Act, 21 U.S.C. section 360bbb-3(b)(1), unless the authorization is terminated or revoked.  Performed at [facility], [address] [place], [place] [address]   CSF culture w Gram Stain     Status: None   Collection Time: 10/08/22  3:30 PM   Specimen: CSF; Cerebrospinal Fluid  Result Value Ref Range Status   Specimen Description   Final    CSF Performed at [facility], [address], [place], [place] [address]    Special Requests   Final    NONE Performed at [facility], [address] [place], [place] [address]    Gram Stain   Final    NO ORGANISMS SEEN WBC SEEN RED BLOOD CELLS SEEN Performed at [facility], [address] [place], [place] [address]    Culture   Final    NO GROWTH 3 DAYS Performed at [facility], [place] [address] [place],  [address]    Report Status 10/11/2022 FINAL  Final         Radiology Studies: CT HEAD WO CONTRAST (5MM)  Result Date: 10/07/2022 CLINICAL DATA:  Altered mental status EXAM: CT HEAD WITHOUT CONTRAST TECHNIQUE: Contiguous axial images were obtained from the base of the skull through the vertex without intravenous contrast. RADIATION DOSE REDUCTION: This exam was performed according to the departmental dose-optimization program which includes automated exposure control, adjustment of the mA and/or kV according to patient size and/or use of iterative reconstruction technique. COMPARISON:  10/06/2022 FINDINGS: Brain: No acute intracranial findings are seen. There are no signs of bleeding within the cranium. Ventricles are not dilated. Cortical sulci are prominent. There is decreased density in periventricular white matter. Vascular: Unremarkable. Skull: Unremarkable. Sinuses/Orbits: There is mucosal thickening in right maxillary sinus. There is mucosal thickening and frothy density in right side of sphenoid sinus. Other: None. IMPRESSION: No acute intracranial findings are seen in noncontrast CT brain. Atrophy. Small-vessel disease. Electronically Signed   By: Elmer Picker M.D.   On:  10/07/2022 15:51   EEG adult  Result Date: 10/07/2022 Lora Havens, MD     10/07/2022 12:28 PM Patient Name: Donna Evans MRN: [id number] Epilepsy Attending: Lora Havens Referring Physician/Provider: Jose Persia, MD Date: 10/07/2022 Duration: 31.51 mins Patient history: 76 year old female with altered mental status.  EEG to eval for seizure. Level of alertness: Awake AEDs during EEG study: None Technical aspects: This EEG study was done with scalp electrodes positioned according to the 10-20 International system of electrode placement. Electrical activity was reviewed with band pass filter of 1-'70Hz'$ , sensitivity of 7 uV/mm, display speed of 73m/sec with a '60Hz'$  notched filter applied as appropriate. EEG data were recorded continuously and digitally stored.  Video monitoring was available and reviewed as appropriate. Description: The posterior dominant rhythm consists of 8-9 Hz activity of moderate voltage (25-35 uV) seen predominantly in posterior head regions, symmetric and reactive to eye opening and eye closing. Physiologic photic driving was seen during photic stimulation. Hyperventilation was not performed.   IMPRESSION: This study is within normal limits. No seizures or epileptiform discharges were seen throughout the recording. A normal interictal EEG does not exclude the diagnosis of epilepsy. Priyanka OBarbra Sarks  [place]domen Limited RUQ (LIVER/GB)  Result Date: 10/07/2022 CLINICAL DATA:  Transaminitis EXAM: ULTRASOUND ABDOMEN LIMITED RIGHT UPPER QUADRANT COMPARISON:  CT abdomen pelvis 09/24/2009 FINDINGS: Gallbladder: Surgically absent Common bile duct: Diameter:  3 mm Liver: Parenchymal echogenicity: Mild diffuse increase echogenicity. Contours: Normal Lesions: None Portal vein: Patent.  Hepatopetal flow Other: None. IMPRESSION: Mild diffuse increased echogenicity of the hepatic parenchyma is a nonspecific indicator of hepatocellular dysfunction, most commonly steatosis. Electronically  Signed   By: Miachel Roux M.D.   On: 10/07/2022 09:36   US Venous Img Lower Bilateral (DVT)  Result Date: 10/07/2022 CLINICAL DATA:  Syncope EXAM: Bilateral lower Extremity Venous Doppler Ultrasound TECHNIQUE: Gray-scale sonography with compression, as well as color and duplex ultrasound, were performed to evaluate the deep venous system(s) from the level of the common femoral vein through the popliteal and proximal calf veins. COMPARISON:  04/29/2022 FINDINGS: VENOUS Normal compressibility of the common femoral, superficial femoral, and popliteal veins, as well as the visualized calf veins. Visualized portions of profunda femoral vein and great saphenous vein unremarkable. No filling defects to suggest DVT on grayscale or color Doppler imaging. Doppler waveforms show normal direction of venous flow, normal respiratory plasticity and response to augmentation. OTHER None. Limitations: none IMPRESSION: No lower extremity DVT. Electronically Signed   By: Miachel Roux M.D.   On: 10/07/2022 09:34   DG Ribs Unilateral Right  Result Date: 10/06/2022 CLINICAL DATA:  [place], right rib fractures EXAM: RIGHT RIBS - 2 VIEW COMPARISON:  Shoulder x-ray 10/06/2022, chest x-ray 10/06/2022 FINDINGS: Frontal and oblique views of the right thoracic cage are obtained. Right breast prosthesis obscures portions of the underlying ribs. Right chest is clear without airspace disease, effusion, or pneumothorax. There are chronic healed right posterior eighth and ninth rib fractures. 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 There are no acute displaced fractures. IMPRESSION: 1. Multiple chronic right rib fractures as above. No acute bony abnormality. Electronically Signed   By: Randa Ngo M.D.   On: 10/06/2022 19:47   MR BRAIN WO CONTRAST  Result Date: 10/06/2022 CLINICAL DATA:  Mental status change.  Fall today. EXAM: MRI HEAD WITHOUT CONTRAST TECHNIQUE: Multiplanar, multiecho pulse sequences  of the brain and surrounding structures were obtained without intravenous contrast. COMPARISON:  CT head 10/06/2022 FINDINGS: Brain: Negative for acute infarct. Generalized mild atrophy. Chronic microvascular ischemic changes in the white matter. Pituitary overall normal in size. 5 mm T1 hyperintensity in the posterior pituitary best seen on sagittal T1 image 13. No suprasellar extension. Vascular: Normal arterial flow voids Skull and upper cervical spine: No focal skeletal lesion. Sinuses/Orbits: Mucosal edema paranasal sinuses. Air-fluid level right maxillary sinus. Bilateral cataract extraction Other: None IMPRESSION: 1. Negative for acute infarct. 2. Atrophy and chronic microvascular ischemic change in the white matter. 3. 5 mm T1 hyperintensity in the posterior pituitary. Possible Rathke's cleft cyst or pituitary adenoma. Slight enlargement since the prior MRI of 04/28/2022 Electronically Signed   By: Franchot Gallo M.D.   On: 10/06/2022 18:57   DG Shoulder Right  Result Date: 10/06/2022 CLINICAL DATA:  Right shoulder pain EXAM: RIGHT SHOULDER - 2+ VIEW COMPARISON:  10/01/2014 FINDINGS: Healed fracture proximal right humerus. No acute fracture. Normal alignment. Multiple right rib fractures of indeterminate age. Recommend rib series of symptomatic. IMPRESSION: 1. No acute fracture of the shoulder. 2. Multiple right rib fractures of indeterminate age. Electronically Signed   By: Franchot Gallo M.D.   On: 10/06/2022 17:44   CT Head Wo Contrast  Result Date: 10/06/2022 CLINICAL DATA:  Acute stroke suspected EXAM: CT HEAD WITHOUT CONTRAST TECHNIQUE: Contiguous axial images were obtained from the base of the skull through the vertex without intravenous  contrast. RADIATION DOSE REDUCTION: This exam was performed according to the departmental dose-optimization program which includes automated exposure control, adjustment of the mA and/or kV according to patient size and/or use of iterative reconstruction  technique. COMPARISON:  CT Head 08/09/22 FINDINGS: Brain: No evidence of acute infarction, hemorrhage, hydrocephalus, extra-axial collection or mass lesion/mass effect. Vascular: No hyperdense vessel or unexpected calcification. Skull: Normal. Negative for fracture or focal lesion. Soft tissue swelling over the left parietal scalp Sinuses/Orbits: Bilateral lens replacement.Frothy secretions right maxillary sinus. Other: None. IMPRESSION: 1. No acute intracranial abnormality. 2. Soft tissue swelling over the left parietal scalp. No underlying fracture. 3. Frothy secretions right maxillary sinus. Correlate for acute sinusitis. Electronically Signed   By: Marin Roberts M.D.   On: 10/06/2022 15:51   DG Chest Port 1 View  Result Date: 10/06/2022 CLINICAL DATA:  Question sepsis.  Fell today. EXAM: PORTABLE CHEST 1 VIEW COMPARISON:  08/09/2022 FINDINGS: Artifact overlies the chest. Density related to bilateral breast implants. The right chest appears clear allowing for that. Abnormal density in the left lower chest could be due to left lower lobe infiltrate/collapse/effusion. Old healed left humeral fracture. IMPRESSION: Abnormal density in the left lower chest that could be due to left lower lobe infiltrate/collapse/effusion. Electronically Signed   By: Nelson Chimes M.D.   On: 10/06/2022 14:03            LOS: 6 days       Emeterio Reeve, DO [facility] 10/14/2022, 5:08 PM   Staff may message me via secure chat in [place]  but this may not receive immediate response,  please page for urgent matters!  If 7PM-7AM, please contact night-coverage [email or web address]  Dictation software was used to generate the above note. Typos may occur and escape review, as with typed/written notes. Please contact Donna Sheppard Coil directly for clarity if needed.

## 2022-10-14 NOTE — Care Management Important Message (Signed)
Important Message  Patient Details  Name: Donna Evans MRN: [id number] Date of Birth: [birth year]   Medicare Important Message Given:  Yes     [name] 10/14/2022, 3:20 PM

## 2022-10-14 NOTE — Plan of Care (Signed)

## 2022-10-14 NOTE — Progress Notes (Addendum)
1311 Pt placed on telebox 40-04 HR noted at 171. In afib with rvr. Per orders '10mg'$  of IV Cardizem given. Pt reports that heart still feels like its racing. Awaiting available bed for 2A.  1320 HR 132-139 at this time.   Ross awaiting bed. Heparin drip started at this time. Verified with another nurse.

## 2022-10-15 ENCOUNTER — Telehealth (HOSPITAL_COMMUNITY): Payer: Self-pay | Admitting: Pharmacy Technician

## 2022-10-15 ENCOUNTER — Other Ambulatory Visit (HOSPITAL_COMMUNITY): Payer: Self-pay

## 2022-10-15 DIAGNOSIS — G9340 Encephalopathy, unspecified: Secondary | ICD-10-CM | POA: Diagnosis not present

## 2022-10-15 LAB — ECHOCARDIOGRAM COMPLETE
AR max vel: 2.53 cm2
AV Area VTI: 2.63 cm2
AV Area mean vel: 2.52 cm2
AV Mean grad: 2 mmHg
AV Peak grad: 3.8 mmHg
Ao pk vel: 0.98 m/s
Area-P 1/2: 9.85 cm2
Height: 65 in
S' Lateral: 2.3 cm
Weight: 2257.51 oz

## 2022-10-15 LAB — BASIC METABOLIC PANEL
Anion gap: 12 (ref 5–15)
BUN: 33 mg/dL — ABNORMAL HIGH (ref 8–23)
CO2: 25 mmol/L (ref 22–32)
Calcium: 8.6 mg/dL — ABNORMAL LOW (ref 8.9–10.3)
Chloride: 98 mmol/L (ref 98–111)
Creatinine, Ser: 1.2 mg/dL — ABNORMAL HIGH (ref 0.44–1.00)
GFR, Estimated: 47 mL/min — ABNORMAL LOW (ref >60)
Glucose, Bld: 90 mg/dL (ref 70–99)
Potassium: 3.6 mmol/L (ref 3.5–5.1)
Sodium: 135 mmol/L (ref 135–145)

## 2022-10-15 LAB — HEPARIN LEVEL (UNFRACTIONATED)
Heparin Unfractionated: 0.71 IU/mL — ABNORMAL HIGH (ref 0.30–0.70)
Heparin Unfractionated: 0.47 IU/mL (ref 0.30–0.70)

## 2022-10-15 MED ORDER — HYDROCODONE-ACETAMINOPHEN 5-325 MG PO TABS
1.0000 | ORAL_TABLET | Freq: Three times a day (TID) | ORAL | Status: DC | PRN
  Administered 2022-10-15 – 2022-10-17 (×4): 1 via ORAL
  Filled 2022-10-15 (×4): qty 1

## 2022-10-15 MED ORDER — ALPRAZOLAM 0.5 MG PO TABS
0.5000 mg | ORAL_TABLET | Freq: Two times a day (BID) | ORAL | Status: DC | PRN
  Administered 2022-10-15 – 2022-10-18 (×6): 0.5 mg via ORAL
  Filled 2022-10-15 (×6): qty 1

## 2022-10-15 NOTE — Consult Note (Signed)
ANTICOAGULATION CONSULT NOTE  Pharmacy Consult for Heparin Infusion Indication: atrial fibrillation  Allergies  Allergen Reactions   Ciprofloxacin Nausea And Vomiting and Hives   Codeine Anaphylaxis and Hives    Reaction:  Unknown    Demerol [Meperidine] Anaphylaxis and Hives    Reaction:  Unknown     Fluconazole Hives   Latex Anaphylaxis and Rash   Morphine And Related Hives   Doxycycline Nausea And Vomiting   Flagyl [Metronidazole] Nausea And Vomiting    Reaction:  Unknown    Influenza Vaccines Swelling    Localized swelling   Tape Other (See Comments)    Pt allergic to Adhesive tape and latex.(  Skin breaks out)   Valtrex [Valacyclovir Hcl] Hives   Buprenorphine     Other reaction(s): Other (see comments) unknown   Duloxetine Hcl Other (See Comments)    Reaction:  Unknown    Tramadol     Other reaction(s): Not available   Amoxicillin-Pot Clavulanate Nausea And Vomiting   Sulfamethoxazole-Trimethoprim Nausea And Vomiting   Valacyclovir Rash    Patient Measurements: Height: '5\' 5"'$  (165.1 cm) Weight: 64 kg (141 lb 1.5 oz) IBW/kg (Calculated) : 57 Heparin Dosing Weight: 64 kg  Vital Signs: Temp: 98.3 F (36.8 C) (11/03 0700) Temp Source: Oral (11/03 0700) BP: 147/66 (11/03 0700) Pulse Rate: 90 (11/03 0700)  Labs: Recent Labs    10/12/22 1003 10/13/22 0613 10/14/22 1421 10/14/22 1629 10/14/22 2236 10/15/22 0426  HGB 12.2  --  12.5  --   --   --   HCT 36.9  --  37.8  --   --   --   PLT 207  --  239  --   --   --   HEPARINUNFRC  --   --   --   --  0.76*  --   CREATININE 1.32* 1.57*  --  1.34*  --  1.20*  TROPONINIHS  --   --  35* 41*  --   --      Estimated Creatinine Clearance: 35.9 mL/min (A) (by C-G formula based on SCr of 1.2 mg/dL (H)).   Medical History: Past Medical History:  Diagnosis Date   Allergic state    Anginal pain ([place])    Prinzmetal's angina   Anxiety    Arthritis    osteoarthritis   Breast cancer ([place]) 1990   right breast  cancer   Chronic pain    Chronic pain    COPD (chronic obstructive pulmonary disease) (HCC)    Depression    Edema    Fibromyalgia    Foot fracture    Bilateral   GERD (gastroesophageal reflux disease)    History of kidney stones    Hypertension    Leg fracture, right    Low back pain    Lumbosacral neuritis    Medulloadrenal hyperfunction (HCC)    Migraine headache    Peripheral neuropathy    Shoulder fracture, right    Stroke ([place])    TIA   Systemic lupus erythematosus (HCC)    Thyroid disease    TIA (transient ischemic attack) 11/28/2013   Transient global amnesia 2011    Medications:  Scheduled:   vitamin B-12  1,000 mcg Oral Daily   folic acid  1 mg Oral Daily   hydrochlorothiazide  12.5 mg Oral Daily   metoprolol tartrate  25 mg Oral Q8H   multivitamin with minerals  1 tablet Oral Daily   rosuvastatin  10 mg Oral Daily  sodium chloride flush  3 mL Intravenous Q12H   Infusions:   diltiazem (CARDIZEM) infusion Stopped (10/15/22 0826)   heparin 850 Units/hr (10/15/22 0025)   PRN: acetaminophen **OR** acetaminophen, ALPRAZolam, HYDROcodone-acetaminophen, metoprolol tartrate, ondansetron **OR** ondansetron (ZOFRAN) IV  Assessment: Donna Evans is a 76 y.o. female presenting with new onset Afib with RVR (HR up to 171). PMH significant for HTN, CVA, depression/anxiety, HFpEF, CKD3a, COPD, SLE. Patient was not on [facility] PTA per chart review. Patient was given Cardizem '10mg'$  IV x 1 with plans to transfer to the cardiac progressive unit for closer monitoring. Pharmacy has been consulted to initiate and manage heparin infusion.   Baseline Labs 10/25: aPTT 28, PT 12.6, INR 1.0, Hgb 12.7, Hct 32.7, Plt 219   Goal of Therapy:  Heparin level 0.3-0.7 units/ml Monitor platelets by anticoagulation protocol: Yes  Date Time HL Rate/Comment  11/2  2236  0.76 SUPRAtherapeutic 11/3 0847 0.71 SUPRAtherapeutic   Plan:  Decrease heparin infusion to 800 units/hr Check HL 8 hr after  rate change Continue to monitor H&H and platelets daily while on heparin   Gretel Acre, PharmD PGY1 Pharmacy Resident 10/15/2022 9:39 AM

## 2022-10-15 NOTE — Progress Notes (Signed)
PT Cancellation Note  Patient Details Name: Donna Evans MRN: [id number] DOB: 1946/07/01   Cancelled Treatment:    Reason Eval/Treat Not Completed: Medical issues which prohibited therapy: Pt transferred to progressive care unit due to a decline in status.  Per attending MD, PT orders to be completed at this time with new orders to be entered once patient is appropriate to participate with PT services.     Linus Salmons PT, DPT 10/15/22, 2:02 PM

## 2022-10-15 NOTE — Consult Note (Signed)
ANTICOAGULATION CONSULT NOTE  Pharmacy Consult for Heparin Infusion Indication: atrial fibrillation  Allergies  Allergen Reactions   Ciprofloxacin Nausea And Vomiting and Hives   Codeine Anaphylaxis and Hives    Reaction:  Unknown    Demerol [Meperidine] Anaphylaxis and Hives    Reaction:  Unknown     Fluconazole Hives   Latex Anaphylaxis and Rash   Morphine And Related Hives   Doxycycline Nausea And Vomiting   Flagyl [Metronidazole] Nausea And Vomiting    Reaction:  Unknown    Influenza Vaccines Swelling    Localized swelling   Tape Other (See Comments)    Pt allergic to Adhesive tape and latex.(  Skin breaks out)   Valtrex [Valacyclovir Hcl] Hives   Buprenorphine     Other reaction(s): Other (see comments) unknown   Duloxetine Hcl Other (See Comments)    Reaction:  Unknown    Tramadol     Other reaction(s): Not available   Amoxicillin-Pot Clavulanate Nausea And Vomiting   Sulfamethoxazole-Trimethoprim Nausea And Vomiting   Valacyclovir Rash    Patient Measurements: Height: '5\' 5"'$  (165.1 cm) Weight: 64 kg (141 lb 1.5 oz) IBW/kg (Calculated) : 57 Heparin Dosing Weight: 64 kg  Vital Signs: Temp: 98.7 F (37.1 C) (11/03 1930) Temp Source: Oral (11/03 1100) BP: 111/96 (11/03 1930) Pulse Rate: 95 (11/03 1930)  Labs: Recent Labs    10/13/22 0613 10/14/22 1421 10/14/22 1629 10/14/22 2236 10/15/22 0426 10/15/22 0847 10/15/22 1824  HGB  --  12.5  --   --   --   --   --   HCT  --  37.8  --   --   --   --   --   PLT  --  239  --   --   --   --   --   HEPARINUNFRC  --   --   --  0.76*  --  0.71* 0.47  CREATININE 1.57*  --  1.34*  --  1.20*  --   --   TROPONINIHS  --  35* 41*  --   --   --   --      Estimated Creatinine Clearance: 35.9 mL/min (A) (by C-G formula based on SCr of 1.2 mg/dL (H)).   Medical History: Past Medical History:  Diagnosis Date   Allergic state    Anginal pain ([place])    Prinzmetal's angina   Anxiety    Arthritis    osteoarthritis    Breast cancer ([place]) 1990   right breast cancer   Chronic pain    Chronic pain    COPD (chronic obstructive pulmonary disease) (HCC)    Depression    Edema    Fibromyalgia    Foot fracture    Bilateral   GERD (gastroesophageal reflux disease)    History of kidney stones    Hypertension    Leg fracture, right    Low back pain    Lumbosacral neuritis    Medulloadrenal hyperfunction (HCC)    Migraine headache    Peripheral neuropathy    Shoulder fracture, right    Stroke ([place])    TIA   Systemic lupus erythematosus ([place])    Thyroid disease    TIA (transient ischemic attack) 11/28/2013   Transient global amnesia 2011    Medications:  Scheduled:   vitamin B-12  1,000 mcg Oral Daily   folic acid  1 mg Oral Daily   hydrochlorothiazide  12.5 mg Oral Daily   metoprolol tartrate  25 mg Oral Q8H   multivitamin with minerals  1 tablet Oral Daily   rosuvastatin  10 mg Oral Daily   sodium chloride flush  3 mL Intravenous Q12H   Infusions:   diltiazem (CARDIZEM) infusion 5 mg/hr (10/15/22 1933)   heparin 800 Units/hr (10/15/22 1538)   PRN: acetaminophen **OR** acetaminophen, ALPRAZolam, HYDROcodone-acetaminophen, metoprolol tartrate, ondansetron **OR** ondansetron (ZOFRAN) IV  Assessment: Donna Evans is a 76 y.o. female presenting with new onset Afib with RVR (HR up to 171). PMH significant for HTN, CVA, depression/anxiety, HFpEF, CKD3a, COPD, SLE. Patient was not on [facility] PTA per chart review. Patient was given Cardizem '10mg'$  IV x 1 with plans to transfer to the cardiac progressive unit for closer monitoring. Pharmacy has been consulted to initiate and manage heparin infusion.   Baseline Labs 10/25: aPTT 28, PT 12.6, INR 1.0, Hgb 12.7, Hct 32.7, Plt 219   Goal of Therapy:  Heparin level 0.3-0.7 units/ml Monitor platelets by anticoagulation protocol: Yes  Date Time HL Rate/Comment  11/2  2236  0.76 SUPRAtherapeutic 11/3 0847 0.71 SUPRAtherapeutic 11/4 1824 0.47 Therapeutic  x1; 800 un/hr   Plan:  HL therapeutic x1. continue heparin infusion at 800 units/hr Repeat check of HL 8 hr and then daily once consecutively therapeutic. Continue to monitor H&H and platelets daily while on heparin   Lorna Dibble, PharmD, [place] Pharmacist 10/15/2022 8:28 PM

## 2022-10-15 NOTE — Plan of Care (Signed)

## 2022-10-15 NOTE — Consult Note (Signed)
ANTICOAGULATION CONSULT NOTE  Pharmacy Consult for Heparin Indication: atrial fibrillation  Allergies  Allergen Reactions   Ciprofloxacin Nausea And Vomiting and Hives   Codeine Anaphylaxis and Hives    Reaction:  Unknown    Demerol [Meperidine] Anaphylaxis and Hives    Reaction:  Unknown     Fluconazole Hives   Latex Anaphylaxis and Rash   Morphine And Related Hives   Doxycycline Nausea And Vomiting   Flagyl [Metronidazole] Nausea And Vomiting    Reaction:  Unknown    Influenza Vaccines Swelling    Localized swelling   Tape Other (See Comments)    Pt allergic to Adhesive tape and latex.(  Skin breaks out)   Valtrex [Valacyclovir Hcl] Hives   Buprenorphine     Other reaction(s): Other (see comments) unknown   Duloxetine Hcl Other (See Comments)    Reaction:  Unknown    Tramadol     Other reaction(s): Not available   Amoxicillin-Pot Clavulanate Nausea And Vomiting   Sulfamethoxazole-Trimethoprim Nausea And Vomiting   Valacyclovir Rash    Patient Measurements: Height: '5\' 5"'$  (165.1 cm) Weight: 64 kg (141 lb 1.5 oz) IBW/kg (Calculated) : 57 Heparin Dosing Weight: 64 kg  Vital Signs: Temp: 98.2 F (36.8 C) (11/02 2057) BP: 115/87 (11/02 1745) Pulse Rate: 92 (11/02 1745)  Labs: Recent Labs    10/12/22 1003 10/13/22 0613 10/14/22 1421 10/14/22 1629 10/14/22 2236  HGB 12.2  --  12.5  --   --   HCT 36.9  --  37.8  --   --   PLT 207  --  239  --   --   HEPARINUNFRC  --   --   --   --  0.76*  CREATININE 1.32* 1.57*  --  1.34*  --   TROPONINIHS  --   --  35* 41*  --      Estimated Creatinine Clearance: 32.1 mL/min (A) (by C-G formula based on SCr of 1.34 mg/dL (H)).   Medical History: Past Medical History:  Diagnosis Date   Allergic state    Anginal pain ([place])    Prinzmetal's angina   Anxiety    Arthritis    osteoarthritis   Breast cancer ([place]) 1990   right breast cancer   Chronic pain    Chronic pain    COPD (chronic obstructive pulmonary disease)  (HCC)    Depression    Edema    Fibromyalgia    Foot fracture    Bilateral   GERD (gastroesophageal reflux disease)    History of kidney stones    Hypertension    Leg fracture, right    Low back pain    Lumbosacral neuritis    Medulloadrenal hyperfunction (HCC)    Migraine headache    Peripheral neuropathy    Shoulder fracture, right    Stroke (HCC)    TIA   Systemic lupus erythematosus (HCC)    Thyroid disease    TIA (transient ischemic attack) 11/28/2013   Transient global amnesia 2011    Medications:  No history of chronic anticoagulation PTA  Assessment: Pharmacy has been consulted to initiate and titrate heparin in 76yo female who was initially admitted s/p unwitnessed fall. During hospital admission, patient developed atrial fibrillation with RVR(HR as high as 171). Patient was given Cardizem '10mg'$  IV x 1 with plans to transfer to the cardia progressive unit for closer monitoring. Baseline labs were previously drawn, deferring repeat levels.  Goal of Therapy:  Heparin level 0.3-0.7 units/ml Monitor platelets  by anticoagulation protocol: Yes  11/02 2236 HL 0.76. supratherapeutic   Plan:  Decrease heparin infusion to 850 units/hr Recheck HL in 8 hr after rate change Continue to monitor H&H and platelets  Renda Rolls, PharmD, [facility] 10/15/2022 12:10 AM

## 2022-10-15 NOTE — Consult Note (Signed)
CARDIOLOGY CONSULT NOTE               Patient ID: Donna Evans MRN: [id number] DOB/AGE: [birth year] 75 y.o.  Admit date: 10/06/2022 Referring Physician Dr. Emeterio Reeve hospitalist Primary Physician Dr Elijio Miles Primary Cardiologist Dr. Saralyn Pilar Reason for Consultation atrial fibrillation rapid ventricular response  HPI: Patient reportedly was admitted after a fall history of hypertension CVA depression anxiety migraine headaches breast cancer mastectomy chronic pain chronic diastolic congestive heart failure chronic renal insufficiency COPD SLE had a fall at ground level with closed head injury had some delirium confusion possible early dementia was found to have atrial fibrillation while in-house with palpitation tachycardia so cardiology consultation was then recommended.  Patient denies any chest pain.  Patient's had limited activity because of confusion and delirium previous TIA.  Heart rates been difficult to control but now seems to be doing better rate and rate management consideration for anticoagulation echocardiogram showed preserved left ventricular function.  Patient has a history of lymphedema history of DVT has been on torsemide for diuresis but with episode of presumed syncope is unclear if she was dehydrated orthostatic so its been held  Review of systems complete and found to be negative unless listed above     Past Medical History:  Diagnosis Date   Allergic state    Anginal pain ([place])    Prinzmetal's angina   Anxiety    Arthritis    osteoarthritis   Breast cancer ([place]) 1990   right breast cancer   Chronic pain    Chronic pain    COPD (chronic obstructive pulmonary disease) ([place])    Depression    Edema    Fibromyalgia    Foot fracture    Bilateral   GERD (gastroesophageal reflux disease)    History of kidney stones    Hypertension    Leg fracture, right    Low back pain    Lumbosacral neuritis    Medulloadrenal hyperfunction (HCC)    Migraine  headache    Peripheral neuropathy    Shoulder fracture, right    Stroke ([place])    TIA   Systemic lupus erythematosus ([place])    Thyroid disease    TIA (transient ischemic attack) 11/28/2013   Transient global amnesia 2011    Past Surgical History:  Procedure Laterality Date   ABDOMINAL HYSTERECTOMY  1981   APPENDECTOMY  1957   AUGMENTATION MAMMAPLASTY Bilateral 1990   saline submuscular   BACK SURGERY  1989   BILATERAL TOTAL MASTECTOMY WITH AXILLARY LYMPH NODE DISSECTION  1990   BREAST IMPLANT EXCHANGE Bilateral 05/11/2016   Procedure: REMOVAL AND REPLACEMENT OF BREAST IMPLANTS ;  Surgeon: Irene Limbo, MD;  Location: [place];  Service: Plastics;  Laterality: Bilateral;   CAPSULECTOMY Bilateral 05/11/2016   Procedure: CAPSULECTOMY CAPSULORRAPHY ;  Surgeon: Irene Limbo, MD;  Location: [place];  Service: Plastics;  Laterality: Bilateral;   CATARACT EXTRACTION W/PHACO Right 12/26/2018   Procedure: CATARACT EXTRACTION PHACO AND INTRAOCULAR LENS PLACEMENT ([place]) RIGHT;  Surgeon: Birder Robson, MD;  Location: [facility];  Service: Ophthalmology;  Laterality: Right;  [place]  00:46 CDE 8.50 Fluid pack lot # [id number]   CATARACT EXTRACTION W/PHACO Left 01/23/2019   Procedure: CATARACT EXTRACTION PHACO AND INTRAOCULAR LENS PLACEMENT ([place]) LEFT;  Surgeon: Birder Robson, MD;  Location: [facility];  Service: Ophthalmology;  Laterality: Left;  [place]  00:45 CDE  8.41 fluid pack lot # [id number]   CHOLECYSTECTOMY  1979   COLONOSCOPY WITH PROPOFOL  N/A 01/25/2018   Procedure: COLONOSCOPY WITH PROPOFOL;  Surgeon: Manya Silvas, MD;  Location: [facility]COPY;  Service: Endoscopy;  Laterality: N/A;   COPD     ESOPHAGOGASTRODUODENOSCOPY (EGD) WITH PROPOFOL N/A 03/07/2018   Procedure: ESOPHAGOGASTRODUODENOSCOPY (EGD) WITH PROPOFOL;  Surgeon: Manya Silvas, MD;  Location[facility];  Service: Endoscopy;  Laterality: N/A;   FRACTURE SURGERY     HARDWARE REMOVAL  Left 06/18/2020   Procedure: HARDWARE REMOVAL;  Surgeon: Lovell Sheehan, MD;  Location: [facility];  Service: Orthopedics;  Laterality: Left;   HUMERUS IM NAIL Left 12/31/2019   Procedure: INTRAMEDULLARY (IM) NAIL HUMERAL;  Surgeon: Lovell Sheehan, MD;  Location: [facility];  Service: Orthopedics;  Laterality: Left;   LIPOSUCTION Right 05/11/2016   Procedure: LIPOSUCTION;  Surgeon: Irene Limbo, MD;  Location: [place];  Service: Plastics;  Laterality: Right;   LIVER BIOPSY  2011   MASTECTOMY SUBCUTANEOUS Bilateral 1990   MASTOPEXY Bilateral 05/11/2016   Procedure: MASTOPEXY BILATERAL ;  Surgeon: Irene Limbo, MD;  Location: [place];  Service: Plastics;  Laterality: Bilateral;    Medications Prior to Admission  Medication Sig Dispense Refill Last Dose   ALPRAZolam (XANAX) 1 MG tablet Take 2 mg by mouth 2 (two) times daily.   10/05/2022   cyclobenzaprine (FLEXERIL) 10 MG tablet Take 10 mg by mouth 3 (three) times daily.   10/06/2022   rosuvastatin (CRESTOR) 10 MG tablet Take 10 mg by mouth daily.   10/05/2022   Vitamin D, Ergocalciferol, 50000 units CAPS Take 1 capsule by mouth once a week.   Past Week   zolpidem (AMBIEN) 10 MG tablet Take 1 tablet (10 mg total) by mouth at bedtime for 1 day. 1 tablet 0 10/05/2022   alum & mag hydroxide-simeth (MAALOX/MYLANTA) 200-200-20 MG/5ML suspension Take 15 mLs by mouth every 6 (six) hours as needed for indigestion or heartburn.   prn   aspirin EC 81 MG tablet Take 1 tablet (81 mg total) by mouth daily. Swallow whole. (Patient not taking: Reported on 10/06/2022) 30 tablet 0 Not Taking   butorphanol (STADOL) 10 MG/ML nasal spray Place 1 spray into the nose every 4 (four) hours as needed for headache. 15 mL 0 prn   calcium carbonate (TUMS - DOSED IN MG ELEMENTAL CALCIUM) 500 MG chewable tablet Chew 1 tablet by mouth 3 (three) times daily as needed for indigestion or heartburn.   prn   Cyanocobalamin (VITAMIN B-12 IJ)  Inject 1,000 mg as directed every 30 (thirty) days.      hydrochlorothiazide (MICROZIDE) 12.5 MG capsule Take 12.5 mg by mouth daily. (Patient not taking: Reported on 10/06/2022)   Not Taking   torsemide (DEMADEX) 20 MG tablet Take 20 mg by mouth daily as needed. (Patient not taking: Reported on 10/06/2022)   Not Taking   Social History   Socioeconomic History   Marital status: Married    Spouse name: Not on file   Number of children: 1   Years of education: college3   Highest education level: Not on file  Occupational History   Occupation: Retired  Tobacco Use   Smoking status: Former    Packs/day: 1.00    Years: 40.00    Total pack years: 40.00    Types: Cigarettes    Quit date: 08/28/2005    Years since quitting: 17.1   Smokeless tobacco: Never  Vaping Use   Vaping Use: Never used  Substance and Sexual Activity   Alcohol use: Not  Currently    Alcohol/week: 2.0 standard drinks of alcohol    Types: 2 Shots of liquor per week   Drug use: No   Sexual activity: Never  Other Topics Concern   Not on file  Social History Narrative   Not on file   Social Determinants of Health   Financial Resource Strain: Not on file  Food Insecurity: No Food Insecurity (10/06/2022)   Hunger Vital Sign    Worried About Running Out of Food in the Last Year: Never true    Ran Out of Food in the Last Year: Never true  Transportation Needs: No Transportation Needs (10/06/2022)   PRAPARE - Hydrologist (Medical): No    Lack of Transportation (Non-Medical): No  Physical Activity: Not on file  Stress: Not on file  Social Connections: Not on file  Intimate Partner Violence: Not At Risk (10/06/2022)   Humiliation, Afraid, Rape, and Kick questionnaire    Fear of Current or Ex-Partner: No    Emotionally Abused: No    Physically Abused: No    Sexually Abused: No    Family History  Problem Relation Age of Onset   Atrial fibrillation Mother    Atrial fibrillation  Sister    Cancer Sister    Diabetes Sister    Breast cancer Sister 68   Diabetes Father    Cancer Sister    Diabetes Sister    Atrial fibrillation Sister    Kidney disease Maternal Aunt       Review of systems complete and found to be negative unless listed above      PHYSICAL EXAM  General: Well developed, well nourished, in no acute distress HEENT:  Normocephalic and atramatic Neck:  No JVD.  Lungs: Clear bilaterally to auscultation and percussion. Heart: HRRR . Normal S1 and S2 without gallops or murmurs.  Abdomen: Bowel sounds are positive, abdomen soft and non-tender  Msk:  Back normal, normal gait. Normal strength and tone for age. Extremities: No clubbing, cyanosis or edema.   Neuro: Alert and oriented X 3. Psych:  Good affect, responds appropriately  Labs:   Lab Results  Component Value Date   WBC 7.2 10/14/2022   HGB 12.5 10/14/2022   HCT 37.8 10/14/2022   MCV 92.4 10/14/2022   PLT 239 10/14/2022    Recent Labs  Lab 10/15/22 0426  NA 135  K 3.6  CL 98  CO2 25  BUN 33*  CREATININE 1.20*  CALCIUM 8.6*  GLUCOSE 90   Lab Results  Component Value Date   CKTOTAL 105 10/06/2022   CKMB 2.2 02/29/2012   TROPONINI <0.03 07/21/2018    Lab Results  Component Value Date   CHOL 111 04/29/2022   CHOL 203 (A) 04/28/2015   Lab Results  Component Value Date   HDL 42 04/29/2022   HDL 59 04/28/2015   Lab Results  Component Value Date   LDLCALC 46 04/29/2022   LDLCALC 116 04/28/2015   Lab Results  Component Value Date   TRIG 114 04/29/2022   TRIG 141 04/28/2015   Lab Results  Component Value Date   CHOLHDL 2.6 04/29/2022   No results found for: "LDLDIRECT"    Radiology: ECHOCARDIOGRAM COMPLETE  Result Date: 10/15/2022    ECHOCARDIOGRAM REPORT   Patient Name:   Donna Evans Date of Exam: 10/14/2022 Medical Rec #:  [id number]      Height:       65.0 in Accession #:  [id number]     Weight:       141.1 lb Date of Birth:  March 17, 1946       BSA:           1.706 m Patient Age:    72 years       BP:           122/97 mmHg Patient Gender: F              HR:           145 bpm. Exam Location:  [facility] Procedure: 2D Echo, Color Doppler and Cardiac Doppler Indications:     I48.91 Atrial fibrillation  History:         Patient has prior history of Echocardiogram examinations, most                  recent 04/29/2022. COPD and Stroke; Risk Factors:Hypertension.  Sonographer:     Charmayne Sheer Referring Phys:  [id number] Emeterio Reeve Diagnosing Phys: Yolonda Kida MD  Sonographer Comments: Suboptimal parasternal window and suboptimal apical window. Image acquisition challenging due to breast implants and Image acquisition challenging due to COPD. IMPRESSIONS  1. TDS.  2. Left ventricular ejection fraction, by estimation, is 55 to 60%. The left ventricle has normal function. The left ventricle has no regional wall motion abnormalities. There is mild left ventricular hypertrophy. Left ventricular diastolic function could not be evaluated.  3. Right ventricular systolic function is normal. The right ventricular size is normal.  4. The mitral valve is normal in structure. No evidence of mitral valve regurgitation.  5. The aortic valve is normal in structure. Aortic valve regurgitation is not visualized. Conclusion(s)/Recommendation(s): Poor windows for evaluation of left ventricular function by transthoracic echocardiography. Would recommend an alternative means of evaluation. FINDINGS  Left Ventricle: Left ventricular ejection fraction, by estimation, is 55 to 60%. The left ventricle has normal function. The left ventricle has no regional wall motion abnormalities. The left ventricular internal cavity size was normal in size. There is  mild left ventricular hypertrophy. Left ventricular diastolic function could not be evaluated. Right Ventricle: The right ventricular size is normal. No increase in right ventricular wall thickness. Right ventricular systolic function is normal.  Left Atrium: Left atrial size was normal in size. Right Atrium: Right atrial size was normal in size. Pericardium: There is no evidence of pericardial effusion. Mitral Valve: The mitral valve is normal in structure. No evidence of mitral valve regurgitation. Tricuspid Valve: The tricuspid valve is normal in structure. Tricuspid valve regurgitation is trivial. Aortic Valve: The aortic valve is normal in structure. Aortic valve regurgitation is not visualized. Aortic valve mean gradient measures 2.0 mmHg. Aortic valve peak gradient measures 3.8 mmHg. Aortic valve area, by VTI measures 2.63 cm. Pulmonic Valve: The pulmonic valve was grossly normal. Pulmonic valve regurgitation is not visualized. Aorta: The ascending aorta was not well visualized. IAS/Shunts: No atrial level shunt detected by color flow Doppler. Additional Comments: TDS.  LEFT VENTRICLE PLAX 2D LVIDd:         3.40 cm   Diastology LVIDs:         2.30 cm   LV e' medial:    6.42 cm/s LV PW:         1.40 cm   LV E/e' medial:  12.6 LV IVS:        1.00 cm   LV e' lateral:   9.90 cm/s LVOT diam:     1.60 cm   LV  E/e' lateral: 8.2 LV SV:         35 LV SV Index:   21 LVOT Area:     2.01 cm  RIGHT VENTRICLE RV Basal diam:  2.20 cm LEFT ATRIUM           Index        RIGHT ATRIUM          Index LA diam:      3.00 cm 1.76 cm/m   RA Area:     9.16 cm LA Vol (A2C): 14.7 ml 8.62 ml/m   RA Volume:   19.40 ml 11.37 ml/m LA Vol (A4C): 26.5 ml 15.54 ml/m  AORTIC VALVE                    PULMONIC VALVE AV Area (Vmax):    2.53 cm     PV Vmax:       1.15 m/s AV Area (Vmean):   2.52 cm     PV Vmean:      81.200 cm/s AV Area (VTI):     2.63 cm     PV VTI:        0.141 m AV Vmax:           97.80 cm/s   PV Peak grad:  5.3 mmHg AV Vmean:          69.600 cm/s  PV Mean grad:  3.0 mmHg AV VTI:            0.134 m AV Peak Grad:      3.8 mmHg AV Mean Grad:      2.0 mmHg LVOT Vmax:         123.00 cm/s LVOT Vmean:        87.100 cm/s LVOT VTI:          0.175 m LVOT/AV VTI ratio:  1.31  AORTA Ao Root diam: 3.10 cm MITRAL VALVE MV Area (PHT): 9.85 cm    SHUNTS MV Decel Time: 77 msec     Systemic VTI:  0.18 m MV E velocity: 80.70 cm/s  Systemic Diam: 1.60 cm Gift Rueckert Prince [place] MD Electronically signed by Yolonda Kida MD Signature Date/Time: 10/15/2022/1:18:38 PM    Final    DG FL GUIDED LUMBAR PUNCTURE  Result Date: 10/08/2022 CLINICAL DATA:  Patient with altered mental status. Request received for diagnostic lumbar puncture. Ordering provider asked for high volume tap in case of need for further laboratory studies on fluid. EXAM: DIAGNOSTIC LUMBAR PUNCTURE UNDER FLUOROSCOPIC GUIDANCE COMPARISON:  None Available. FLUOROSCOPY: Radiation Exposure Index (as provided by the fluoroscopic device): 8.00 mGy Kerma PROCEDURE: Informed consent was obtained from the patient prior to the procedure, including potential complications of headache, allergy, and pain. With the patient prone, the lower back was prepped with Betadine. 1% Lidocaine was used for local anesthesia. Lumbar puncture was performed at the L4-L5 level using a 20 gauge needle with return of clear CSF with an opening pressure of 24 cm water, closing pressure of 14 cm water. 16 ml of CSF were obtained for laboratory studies. The patient tolerated the procedure well and there were no apparent complications. IMPRESSION: 1.  Technically successful lumbar puncture at L4-L5 level. 2. Opening pressure of 24 cm water, closing pressure of 14 cm water. 3.  16 mL of clear CSF collected for laboratory study. Read by: Reatha Armour, PA-C Electronically Signed   By: Albin Felling M.D.   On: 10/08/2022 16:28   CT HEAD WO CONTRAST (  5MM)  Result Date: 10/07/2022 CLINICAL DATA:  Altered mental status EXAM: CT HEAD WITHOUT CONTRAST TECHNIQUE: Contiguous axial images were obtained from the base of the skull through the vertex without intravenous contrast. RADIATION DOSE REDUCTION: This exam was performed according to the departmental  dose-optimization program which includes automated exposure control, adjustment of the mA and/or kV according to patient size and/or use of iterative reconstruction technique. COMPARISON:  10/06/2022 FINDINGS: Brain: No acute intracranial findings are seen. There are no signs of bleeding within the cranium. Ventricles are not dilated. Cortical sulci are prominent. There is decreased density in periventricular white matter. Vascular: Unremarkable. Skull: Unremarkable. Sinuses/Orbits: There is mucosal thickening in right maxillary sinus. There is mucosal thickening and frothy density in right side of sphenoid sinus. Other: None. IMPRESSION: No acute intracranial findings are seen in noncontrast CT brain. Atrophy. Small-vessel disease. Electronically Signed   By: Elmer Picker M.D.   On: 10/07/2022 15:51   EEG adult  Result Date: 10/07/2022 Lora Havens, MD     10/07/2022 12:28 PM Patient Name: Donna Evans MRN: [id number] Epilepsy Attending: Lora Havens Referring Physician/Provider: Jose Persia, MD Date: 10/07/2022 Duration: 31.51 mins Patient history: 76 year old female with altered mental status.  EEG to eval for seizure. Level of alertness: Awake AEDs during EEG study: None Technical aspects: This EEG study was done with scalp electrodes positioned according to the 10-20 International system of electrode placement. Electrical activity was reviewed with band pass filter of 1-'70Hz'$ , sensitivity of 7 uV/mm, display speed of 39m/sec with a '60Hz'$  notched filter applied as appropriate. EEG data were recorded continuously and digitally stored.  Video monitoring was available and reviewed as appropriate. Description: The posterior dominant rhythm consists of 8-9 Hz activity of moderate voltage (25-35 uV) seen predominantly in posterior head regions, symmetric and reactive to eye opening and eye closing. Physiologic photic driving was seen during photic stimulation. Hyperventilation was not  performed.   IMPRESSION: This study is within normal limits. No seizures or epileptiform discharges were seen throughout the recording. A normal interictal EEG does not exclude the diagnosis of epilepsy. Priyanka OBarbra Sarks  [place]domen Limited RUQ (LIVER/GB)  Result Date: 10/07/2022 CLINICAL DATA:  Transaminitis EXAM: ULTRASOUND ABDOMEN LIMITED RIGHT UPPER QUADRANT COMPARISON:  CT abdomen pelvis 09/24/2009 FINDINGS: Gallbladder: Surgically absent Common bile duct: Diameter: 3 mm Liver: Parenchymal echogenicity: Mild diffuse increase echogenicity. Contours: Normal Lesions: None Portal vein: Patent.  Hepatopetal flow Other: None. IMPRESSION: Mild diffuse increased echogenicity of the hepatic parenchyma is a nonspecific indicator of hepatocellular dysfunction, most commonly steatosis. Electronically Signed   By: FMiachel RouxM.D.   On: 10/07/2022 09:36   [place]Venous Img Lower Bilateral (DVT)  Result Date: 10/07/2022 CLINICAL DATA:  Syncope EXAM: Bilateral lower Extremity Venous Doppler Ultrasound TECHNIQUE: Gray-scale sonography with compression, as well as color and duplex ultrasound, were performed to evaluate the deep venous system(s) from the level of the common femoral vein through the popliteal and proximal calf veins. COMPARISON:  04/29/2022 FINDINGS: VENOUS Normal compressibility of the common femoral, superficial femoral, and popliteal veins, as well as the visualized calf veins. Visualized portions of profunda femoral vein and great saphenous vein unremarkable. No filling defects to suggest DVT on grayscale or color Doppler imaging. Doppler waveforms show normal direction of venous flow, normal respiratory plasticity and response to augmentation. OTHER None. Limitations: none IMPRESSION: No lower extremity DVT. Electronically Signed   By: FMiachel RouxM.D.   On: 10/07/2022 09:34   DG Ribs  Unilateral Right  Result Date: 10/06/2022 CLINICAL DATA:  [place], right rib fractures EXAM: RIGHT RIBS - 2 VIEW  COMPARISON:  Shoulder x-ray 10/06/2022, chest x-ray 10/06/2022 FINDINGS: Frontal and oblique views of the right thoracic cage are obtained. Right breast prosthesis obscures portions of the underlying ribs. Right chest is clear without airspace disease, effusion, or pneumothorax. There are chronic healed right posterior eighth and ninth rib fractures. 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 There are no acute displaced fractures. IMPRESSION: 1. Multiple chronic right rib fractures as above. No acute bony abnormality. Electronically Signed   By: Randa Ngo M.D.   On: 10/06/2022 19:47   MR BRAIN WO CONTRAST  Result Date: 10/06/2022 CLINICAL DATA:  Mental status change.  Fall today. EXAM: MRI HEAD WITHOUT CONTRAST TECHNIQUE: Multiplanar, multiecho pulse sequences of the brain and surrounding structures were obtained without intravenous contrast. COMPARISON:  CT head 10/06/2022 FINDINGS: Brain: Negative for acute infarct. Generalized mild atrophy. Chronic microvascular ischemic changes in the white matter. Pituitary overall normal in size. 5 mm T1 hyperintensity in the posterior pituitary best seen on sagittal T1 image 13. No suprasellar extension. Vascular: Normal arterial flow voids Skull and upper cervical spine: No focal skeletal lesion. Sinuses/Orbits: Mucosal edema paranasal sinuses. Air-fluid level right maxillary sinus. Bilateral cataract extraction Other: None IMPRESSION: 1. Negative for acute infarct. 2. Atrophy and chronic microvascular ischemic change in the white matter. 3. 5 mm T1 hyperintensity in the posterior pituitary. Possible Rathke's cleft cyst or pituitary adenoma. Slight enlargement since the prior MRI of 04/28/2022 Electronically Signed   By: Franchot Gallo M.D.   On: 10/06/2022 18:57   DG Shoulder Right  Result Date: 10/06/2022 CLINICAL DATA:  Right shoulder pain EXAM: RIGHT SHOULDER - 2+ VIEW COMPARISON:  10/01/2014 FINDINGS: Healed fracture  proximal right humerus. No acute fracture. Normal alignment. Multiple right rib fractures of indeterminate age. Recommend rib series of symptomatic. IMPRESSION: 1. No acute fracture of the shoulder. 2. Multiple right rib fractures of indeterminate age. Electronically Signed   By: Franchot Gallo M.D.   On: 10/06/2022 17:44   CT Head Wo Contrast  Result Date: 10/06/2022 CLINICAL DATA:  Acute stroke suspected EXAM: CT HEAD WITHOUT CONTRAST TECHNIQUE: Contiguous axial images were obtained from the base of the skull through the vertex without intravenous contrast. RADIATION DOSE REDUCTION: This exam was performed according to the departmental dose-optimization program which includes automated exposure control, adjustment of the mA and/or kV according to patient size and/or use of iterative reconstruction technique. COMPARISON:  CT Head 08/09/22 FINDINGS: Brain: No evidence of acute infarction, hemorrhage, hydrocephalus, extra-axial collection or mass lesion/mass effect. Vascular: No hyperdense vessel or unexpected calcification. Skull: Normal. Negative for fracture or focal lesion. Soft tissue swelling over the left parietal scalp Sinuses/Orbits: Bilateral lens replacement.Frothy secretions right maxillary sinus. Other: None. IMPRESSION: 1. No acute intracranial abnormality. 2. Soft tissue swelling over the left parietal scalp. No underlying fracture. 3. Frothy secretions right maxillary sinus. Correlate for acute sinusitis. Electronically Signed   By: Marin Roberts M.D.   On: 10/06/2022 15:51   DG Chest Port 1 View  Result Date: 10/06/2022 CLINICAL DATA:  Question sepsis.  Fell today. EXAM: PORTABLE CHEST 1 VIEW COMPARISON:  08/09/2022 FINDINGS: Artifact overlies the chest. Density related to bilateral breast implants. The right chest appears clear allowing for that. Abnormal density in the left lower chest could be due to left lower lobe infiltrate/collapse/effusion. Old healed left humeral fracture.  IMPRESSION: Abnormal density  in the left lower chest that could be due to left lower lobe infiltrate/collapse/effusion. Electronically Signed   By: Nelson Chimes M.D.   On: 10/06/2022 14:03    EKG: Atrial fibrillation rate of around 100 nonspecific ST-T wave changes  ASSESSMENT AND PLAN:  Acute onset colopathy Lymphedema Altered mental status Pneumonia community-acquired Closed head injury Delirium Atrial fibrillation paroxysmal RVR Accidental fall . Plan Agree with telemetry follow-up telemetry EKGs Continue rate control for atrial fibrillation Episode of presumed syncope unclear etiology continue current work-up telemetry consider neurology evaluation Palpitations possibly related to atrial fibrillation consider anticoagulation as well as rate control Agree with antibiotic therapy for what appears to be probable pneumonia Agree with hypertension management and control Consider modest diuresis for lower extremity edema support stockings elevation Recommend conservative cardiac involvement at this stage Signed: Yolonda Kida MD 10/15/2022, 2:59 PM

## 2022-10-15 NOTE — Telephone Encounter (Signed)
Pharmacy Patient Advocate Encounter  Insurance verification completed.    The patient is insured through [employer] Medicare Part D   The patient is currently admitted and ran test claims for the following: Eliquis.  Copays and coinsurance results were relayed to Inpatient clinical team.      

## 2022-10-15 NOTE — Progress Notes (Signed)
PROGRESS NOTE    Donna Evans   JKK:[id number] DOB: 1946/12/08  DOA: 10/06/2022 Date of Service: 10/15/22 PCP: Jodi Marble, MD     Brief Narrative / Hospital Course:  Donna Evans is a 76 y.o. female with a PMH significant for hypertension, CVA, depression with anxiety, migraine headache, right breast cancer s/p mastectomy, chronic pain syndrome, HFpEF, CKD stage IIIa, COPD, SLE. Presented from home to the ED on 10/06/2022 with period of unwitnessed unconsciousness less than a day which she does not recall. She was found on the floor by a friend and was speaking incoherently.  In the ED, BP 169/76 with heart rate of 70.  She was hypothermic at 95.3. Initial work-up remarkable for normal CBC, lactic acid, troponin, and CK.  CMP notable for potassium of 3.1, AST of 15, alkaline phosphatase of 638, total bili of 1.3.  CT head was obtained that did not show any acute abnormality other than soft tissue swelling over the left parietal scalp. Brain MRI negative for acute infarct. Initially treated with Abx and IV fluids as well as electrolyte replacement. Patient was admitted to medicine service for further workup and management of AMS  Given history of previous similar episodes, normal imaging in ED, EEG was repeated and was also negative.  Allergy evaluated, concluded overall confusion but is delirium on dementia and have signed off.  Holding/decreasing sedating medications as able. 10/12/22 - patient is agreeable to SNF. Insurance has denied. Family plans to appeal and attending physician Dr Ouida Sills agreed with that - stating patient was not appropriate to go home at this time.  11/01 - remains significantly weak, still pending nephew calling insurance for appeal, see TOC note from today, the time sensitivity was stressed to him.  11/02: New Afib RVR - to progressive, trial diltiazem IV x1 --> did not convert, will need cardizem gtt and heparin gtt. Will repeat Echo. HR not responding,  consulted cardiology Dr Clayborn Bigness advised metoprolol '5mg'$  IV Q15 minutes x 3 if heart rate > 120 and Bp >100. Metoprolol tartrate 25 mg po Q8 hr starting now. Consider amiodarone if still not responding.   11/03: remains on cardizem gtt. HR remains elevated today.   Consultants:  Neurology   Procedures: EEG      ASSESSMENT & PLAN:   Principal Problem:   Acute encephalopathy Active Problems:   Ground-level fall   Lymphedema   Elevated LFTs   Altered mental status   Bilateral leg edema   Community acquired pneumonia of left lower lobe of lung   Head injury   Delirium due to another medical condition   Paroxysmal atrial fibrillation with RVR (HCC)  Paroxysmal atrial fibrillation with RVR ([place]) to progressive unit Diltiazem gtt heparin gtt.  Echo --> EF 55-60%, no RWMA, unable to eval diastolic fxn  Check troponin, TSH, BMP, Mg, CBC --> no concerns  Metoprolol tartrate 25 mg po Q8 hr  Cardiology following.    Acute encephalopathy- persistent and fluctuating delirium. She has periods of disorientation and lack of capacity.  Mild to Moderate dementia- worsening.  H/o falls and balance issues H/o pelvic and radial fractures from previous fall Repeat EEG negative.  Neurology evaluated and concluded overall confusion is delirium on dementia. Neuro has signed off. No further recommendations.  Frequent neurochecks, re-orientation B12 ordered continue to hold/decrease sedating medications as able Holding home Flexeril and Ambien PT/OT recommending SNF at dc, TOC engaged. delirium precautions    Elevated LFTs - likely fatty liver  Per  chart review, patient has a history of elevated alkaline phosphatase dating back to 2014 with new elevation in AST dating back to August 13, 2022 that is improving.  No known alcohol use.  GGT --> elevated.  RUQ [place] --> significant for diffuse liver increased echogenicity could be sign of fatty liver vs viral infection.  Hepatitis panel -->  negative.   HTN  HLD well controlled on home medications   Hx HFrEF euvolemic on exam   Body mass index is 23.48 kg/m.     DVT prophylaxis: heparin gtt  Pertinent IV fluids/nutrition: no continuous IV fluids  Central lines / invasive devices: none  Code Status: DNR Family Communication:   Disposition: remains inpatient, now Afib RVR  TOC needs: waiting on nephew to appeal for SNF Barriers to discharge / significant pending items: waiting on nephew to appeal for SNF, if unable to arrange this will need to d/c home w/ [facility] however new Afib will likely be here couple more days              Subjective:  Patient reports palpitations are better, no chest pain. Feeling a bit anxious this morning but otherwise no complaints.        Objective:  Vitals:   10/15/22 0800 10/15/22 1100 10/15/22 1200 10/15/22 1300  BP: 128/72 133/87 132/75 (!) 116/36  Pulse: 85  95 (!) 116  Resp: 16  (!) 23   Temp:  (!) 97.5 F (36.4 C)    TempSrc:  Oral    SpO2: 93% 98% 92%   Weight:      Height:       No intake or output data in the 24 hours ending 10/15/22 1414  Filed Weights   10/06/22 1312  Weight: 64 kg    Examination: Constitutional:  VS as above General Appearance: alert, well-developed, well-nourished, NAD Respiratory: Normal respiratory effort No wheeze No rhonchi No rales Cardiovascular: Irreg irreg and tachycardic HR 110s-120s No lower extremity edema Gastrointestinal: No tenderness Musculoskeletal:  No clubbing/cyanosis of digits Symmetrical movement in all extremities Neurological: No cranial nerve deficit on limited exam Alert Psychiatric: Normal judgment/insight Normal mood and affect       Scheduled Medications:   vitamin B-12  1,000 mcg Oral Daily   folic acid  1 mg Oral Daily   hydrochlorothiazide  12.5 mg Oral Daily   metoprolol tartrate  25 mg Oral Q8H   multivitamin with minerals  1 tablet Oral Daily   rosuvastatin  10 mg Oral  Daily   sodium chloride flush  3 mL Intravenous Q12H    Continuous Infusions:  diltiazem (CARDIZEM) infusion 5 mg/hr (10/15/22 1152)   heparin 800 Units/hr (10/15/22 1001)    PRN Medications:  acetaminophen **OR** acetaminophen, ALPRAZolam, HYDROcodone-acetaminophen, metoprolol tartrate, ondansetron **OR** ondansetron (ZOFRAN) IV  Antimicrobials:  Anti-infectives (From admission, onward)    Start     Dose/Rate Route Frequency Ordered Stop   10/06/22 1600  cefTRIAXone (ROCEPHIN) 1 g in sodium chloride 0.9 % 100 mL IVPB        1 g 200 mL/hr over 30 Minutes Intravenous  Once 10/06/22 1556 10/06/22 1637   10/06/22 1600  azithromycin (ZITHROMAX) 500 mg in sodium chloride 0.9 % 250 mL IVPB        500 mg 250 mL/hr over 60 Minutes Intravenous  Once 10/06/22 1556 10/06/22 1807       Data Reviewed: I have personally reviewed following labs and imaging studies  CBC: Recent Labs  Lab 10/12/22 1003  10/14/22 1421  WBC 8.0 7.2  HGB 12.2 12.5  HCT 36.9 37.8  MCV 91.8 92.4  PLT 207 681   Basic Metabolic Panel: Recent Labs  Lab 10/12/22 1003 10/13/22 0613 10/14/22 1421 10/14/22 1629 10/15/22 0426  NA 135 137  --  137 135  K 3.0* 3.3*  --  3.9 3.6  CL 97* 98  --  98 98  CO2 27 30  --  26 25  GLUCOSE 87 91  --  99 90  BUN 20 31*  --  31* 33*  CREATININE 1.32* 1.57*  --  1.34* 1.20*  CALCIUM 8.5* 8.6*  --  9.0 8.6*  MG 2.3  --  2.1  --   --    GFR: Estimated Creatinine Clearance: 35.9 mL/min (A) (by C-G formula based on SCr of 1.2 mg/dL (H)). Liver Function Tests: Recent Labs  Lab 10/08/22 1530  ALBUMIN 2.7*   No results for input(s): "LIPASE", "AMYLASE" in the last 168 hours. No results for input(s): "AMMONIA" in the last 168 hours.  Coagulation Profile: No results for input(s): "INR", "PROTIME" in the last 168 hours.  Cardiac Enzymes: No results for input(s): "CKTOTAL", "CKMB", "CKMBINDEX", "TROPONINI" in the last 168 hours. BNP (last 3 results) No results for  input(s): "PROBNP" in the last 8760 hours. HbA1C: No results for input(s): "HGBA1C" in the last 72 hours. CBG: Recent Labs  Lab 10/09/22 1204 10/09/22 1635 10/10/22 0827 10/10/22 1250 10/10/22 1641  GLUCAP 119* 101* 79 89 87   Lipid Profile: No results for input(s): "CHOL", "HDL", "LDLCALC", "TRIG", "CHOLHDL", "LDLDIRECT" in the last 72 hours. Thyroid Function Tests: Recent Labs    10/14/22 1421  TSH 2.732   Anemia Panel: No results for input(s): "VITAMINB12", "FOLATE", "FERRITIN", "TIBC", "IRON", "RETICCTPCT" in the last 72 hours. Urine analysis:    Component Value Date/Time   COLORURINE YELLOW (A) 10/06/2022 1328   APPEARANCEUR CLEAR (A) 10/06/2022 1328   LABSPEC 1.013 10/06/2022 1328   PHURINE 7.0 10/06/2022 1328   GLUCOSEU NEGATIVE 10/06/2022 1328   HGBUR NEGATIVE 10/06/2022 [place] 10/06/2022 1328   BILIRUBINUR neg 11/10/2015 1352   KETONESUR NEGATIVE 10/06/2022 1328   PROTEINUR 30 (A) 10/06/2022 1328   UROBILINOGEN negative 11/10/2015 1352   UROBILINOGEN 0.2 11/20/2013 0201   NITRITE NEGATIVE 10/06/2022 1328   LEUKOCYTESUR NEGATIVE 10/06/2022 1328   Sepsis Labs: '@LABRCNTIP'$ (procalcitonin:4,lacticidven:4)  Recent Results (from the past 240 hour(s))  Blood Culture (routine x 2)     Status: None   Collection Time: 10/06/22  1:23 PM   Specimen: BLOOD  Result Value Ref Range Status   Specimen Description   Final    BLOOD Blood Culture results may not be optimal due to an inadequate volume of blood received in culture bottles   Special Requests   Final    BOTTLES DRAWN AEROBIC AND ANAEROBIC RIGHT ANTECUBITAL   Culture   Final    NO GROWTH 5 DAYS Performed at [facility], [address] [place],  [address]    Report Status 10/11/2022 FINAL  Final  Blood Culture (routine x 2)     Status: None   Collection Time: 10/06/22  1:24 PM   Specimen: BLOOD  Result Value Ref Range Status   Specimen Description BLOOD BLOOD LEFT ARM   Final   Special Requests   Final    BOTTLES DRAWN AEROBIC AND ANAEROBIC Blood Culture adequate volume   Culture   Final    NO GROWTH 5 DAYS  Performed at [facility], [address], [place], [place] [address]    Report Status 10/11/2022 FINAL  Final  Urine Culture     Status: None   Collection Time: 10/06/22  1:28 PM   Specimen: Urine, Clean Catch  Result Value Ref Range Status   Specimen Description   Final    URINE, CLEAN CATCH Performed at [facility], [address] [place], [place] [address]    Special Requests   Final    NONE Performed at [facility], [address], [place], [place] [address]    Culture   Final    NO GROWTH Performed at [facility], [place] [address], [place], [place] [address]    Report Status 10/07/2022 FINAL  Final  Resp Panel by RT-PCR (Flu A&B, Covid) Anterior Nasal Swab     Status: None   Collection Time: 10/06/22  4:14 PM   Specimen: Anterior Nasal Swab  Result Value Ref Range Status   SARS Coronavirus 2 by RT PCR NEGATIVE NEGATIVE Final    Comment: (NOTE) SARS-CoV-2 target nucleic acids are NOT DETECTED.  The SARS-CoV-2 RNA is generally detectable in upper respiratory specimens during the acute phase of infection. The lowest concentration of SARS-CoV-2 viral copies this assay can detect is 138 copies/mL. A negative result does not preclude SARS-Cov-2 infection and should not be used as the sole basis for treatment or other patient management decisions. A negative result may occur with  improper specimen collection/handling, submission of specimen other than nasopharyngeal swab, presence of viral mutation(s) within the areas targeted by this assay, and inadequate number of viral copies(<138 copies/mL). A negative result must be combined with clinical observations, patient history, and epidemiological information. The expected result is Negative.  Fact Sheet for Patients:   EntrepreneurPulse.com.au  Fact Sheet for Healthcare Providers:  IncredibleEmployment.be  This test is no t yet approved or cleared by the [place] FDA and  has been authorized for detection and/or diagnosis of SARS-CoV-2 by FDA under an Emergency Use Authorization (EUA). This EUA will remain  in effect (meaning this test can be used) for the duration of the COVID-19 declaration under Section 564(b)(1) of the Act, 21 U.S.C.section 360bbb-3(b)(1), unless the authorization is terminated  or revoked sooner.       Influenza A by PCR NEGATIVE NEGATIVE Final   Influenza B by PCR NEGATIVE NEGATIVE Final    Comment: (NOTE) The Xpert Xpress SARS-CoV-2/FLU/RSV plus assay is intended as an aid in the diagnosis of influenza from Nasopharyngeal swab specimens and should not be used as a sole basis for treatment. Nasal washings and aspirates are unacceptable for Xpert Xpress SARS-CoV-2/FLU/RSV testing.  Fact Sheet for Patients: EntrepreneurPulse.com.au  Fact Sheet for Healthcare Providers: IncredibleEmployment.be  This test is not yet approved or cleared by the [place] FDA and has been authorized for detection and/or diagnosis of SARS-CoV-2 by FDA under an Emergency Use Authorization (EUA). This EUA will remain in effect (meaning this test can be used) for the duration of the COVID-19 declaration under Section 564(b)(1) of the Act, 21 U.S.C. section 360bbb-3(b)(1), unless the authorization is terminated or revoked.  Performed at [facility], [address] [place], [place] [address]   CSF culture w Gram Stain     Status: None   Collection Time: 10/08/22  3:30 PM   Specimen: CSF; Cerebrospinal Fluid  Result Value Ref Range Status   Specimen Description   Final    CSF Performed at [facility], [address], [place], [place] [address]    Special Requests   Final     NONE Performed at [facility], [address] [place], [place] [address]    Gram Stain   Final    NO ORGANISMS SEEN WBC SEEN RED BLOOD CELLS SEEN Performed at [facility], [address] [place], [place] [address]    Culture   Final    NO GROWTH 3 DAYS Performed at [facility], [place] [address], [place], [place] [address]    Report Status 10/11/2022 FINAL  Final         Radiology Studies: CT HEAD WO CONTRAST (5MM)  Result Date: 10/07/2022 CLINICAL DATA:  Altered mental status EXAM: CT HEAD WITHOUT CONTRAST TECHNIQUE: Contiguous axial images were obtained from the base of the skull through the vertex without intravenous contrast. RADIATION DOSE REDUCTION: This exam was performed according to the departmental dose-optimization program which includes automated exposure control, adjustment of the mA and/or kV according to patient size and/or use of iterative reconstruction technique. COMPARISON:  10/06/2022 FINDINGS: Brain: No acute intracranial findings are seen. There are no signs of bleeding within the cranium. Ventricles are not dilated. Cortical sulci are prominent. There is decreased density in periventricular white matter. Vascular: Unremarkable. Skull: Unremarkable. Sinuses/Orbits: There is mucosal thickening in right maxillary sinus. There is mucosal thickening and frothy density in right side of sphenoid sinus. Other: None. IMPRESSION: No acute intracranial findings are seen in noncontrast CT brain. Atrophy. Small-vessel disease. Electronically Signed   By: Elmer Picker M.D.   On: 10/07/2022 15:51   EEG adult  Result Date: 10/07/2022 Lora Havens, MD     10/07/2022 12:28 PM Patient Name: Donna Evans MRN: [id number] Epilepsy Attending: Lora Havens Referring Physician/Provider: Jose Persia, MD Date: 10/07/2022 Duration: 31.51 mins Patient history: 76 year old female with altered mental status.  EEG to eval for seizure. Level of  alertness: Awake AEDs during EEG study: None Technical aspects: This EEG study was done with scalp electrodes positioned according to the 10-20 International system of electrode placement. Electrical activity was reviewed with band pass filter of 1-'70Hz'$ , sensitivity of 7 uV/mm, display speed of 75m/sec with a '60Hz'$  notched filter applied as appropriate. EEG data were recorded continuously and digitally stored.  Video monitoring was available and reviewed as appropriate. Description: The posterior dominant rhythm consists of 8-9 Hz activity of moderate voltage (25-35 uV) seen predominantly in posterior head regions, symmetric and reactive to eye opening and eye closing. Physiologic photic driving was seen during photic stimulation. Hyperventilation was not performed.   IMPRESSION: This study is within normal limits. No seizures or epileptiform discharges were seen throughout the recording. A normal interictal EEG does not exclude the diagnosis of epilepsy. Priyanka OBarbra Sarks  [place]domen Limited RUQ (LIVER/GB)  Result Date: 10/07/2022 CLINICAL DATA:  Transaminitis EXAM: ULTRASOUND ABDOMEN LIMITED RIGHT UPPER QUADRANT COMPARISON:  CT abdomen pelvis 09/24/2009 FINDINGS: Gallbladder: Surgically absent Common bile duct: Diameter: 3 mm Liver: Parenchymal echogenicity: Mild diffuse increase echogenicity. Contours: Normal Lesions: None Portal vein: Patent.  Hepatopetal flow Other: None. IMPRESSION: Mild diffuse increased echogenicity of the hepatic parenchyma is a nonspecific indicator of hepatocellular dysfunction, most commonly steatosis. Electronically Signed   By: FMiachel RouxM.D.   On: 10/07/2022 09:36   [place]Venous Img Lower Bilateral (DVT)  Result Date: 10/07/2022 CLINICAL DATA:  Syncope EXAM: Bilateral lower Extremity Venous Doppler Ultrasound TECHNIQUE: Gray-scale sonography with compression, as well as  color and duplex ultrasound, were performed to evaluate the deep venous system(s) from the level of the  common femoral vein through the popliteal and proximal calf veins. COMPARISON:  04/29/2022 FINDINGS: VENOUS Normal compressibility of the common femoral, superficial femoral, and popliteal veins, as well as the visualized calf veins. Visualized portions of profunda femoral vein and great saphenous vein unremarkable. No filling defects to suggest DVT on grayscale or color Doppler imaging. Doppler waveforms show normal direction of venous flow, normal respiratory plasticity and response to augmentation. OTHER None. Limitations: none IMPRESSION: No lower extremity DVT. Electronically Signed   By: Miachel Roux M.D.   On: 10/07/2022 09:34   DG Ribs Unilateral Right  Result Date: 10/06/2022 CLINICAL DATA:  [place], right rib fractures EXAM: RIGHT RIBS - 2 VIEW COMPARISON:  Shoulder x-ray 10/06/2022, chest x-ray 10/06/2022 FINDINGS: Frontal and oblique views of the right thoracic cage are obtained. Right breast prosthesis obscures portions of the underlying ribs. Right chest is clear without airspace disease, effusion, or pneumothorax. There are chronic healed right posterior eighth and ninth rib fractures. 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 There are no acute displaced fractures. IMPRESSION: 1. Multiple chronic right rib fractures as above. No acute bony abnormality. Electronically Signed   By: Randa Ngo M.D.   On: 10/06/2022 19:47   MR BRAIN WO CONTRAST  Result Date: 10/06/2022 CLINICAL DATA:  Mental status change.  Fall today. EXAM: MRI HEAD WITHOUT CONTRAST TECHNIQUE: Multiplanar, multiecho pulse sequences of the brain and surrounding structures were obtained without intravenous contrast. COMPARISON:  CT head 10/06/2022 FINDINGS: Brain: Negative for acute infarct. Generalized mild atrophy. Chronic microvascular ischemic changes in the white matter. Pituitary overall normal in size. 5 mm T1 hyperintensity in the posterior pituitary best seen on sagittal T1 image 13. No  suprasellar extension. Vascular: Normal arterial flow voids Skull and upper cervical spine: No focal skeletal lesion. Sinuses/Orbits: Mucosal edema paranasal sinuses. Air-fluid level right maxillary sinus. Bilateral cataract extraction Other: None IMPRESSION: 1. Negative for acute infarct. 2. Atrophy and chronic microvascular ischemic change in the white matter. 3. 5 mm T1 hyperintensity in the posterior pituitary. Possible Rathke's cleft cyst or pituitary adenoma. Slight enlargement since the prior MRI of 04/28/2022 Electronically Signed   By: Franchot Gallo M.D.   On: 10/06/2022 18:57   DG Shoulder Right  Result Date: 10/06/2022 CLINICAL DATA:  Right shoulder pain EXAM: RIGHT SHOULDER - 2+ VIEW COMPARISON:  10/01/2014 FINDINGS: Healed fracture proximal right humerus. No acute fracture. Normal alignment. Multiple right rib fractures of indeterminate age. Recommend rib series of symptomatic. IMPRESSION: 1. No acute fracture of the shoulder. 2. Multiple right rib fractures of indeterminate age. Electronically Signed   By: Franchot Gallo M.D.   On: 10/06/2022 17:44   CT Head Wo Contrast  Result Date: 10/06/2022 CLINICAL DATA:  Acute stroke suspected EXAM: CT HEAD WITHOUT CONTRAST TECHNIQUE: Contiguous axial images were obtained from the base of the skull through the vertex without intravenous contrast. RADIATION DOSE REDUCTION: This exam was performed according to the departmental dose-optimization program which includes automated exposure control, adjustment of the mA and/or kV according to patient size and/or use of iterative reconstruction technique. COMPARISON:  CT Head 08/09/22 FINDINGS: Brain: No evidence of acute infarction, hemorrhage, hydrocephalus, extra-axial collection or mass lesion/mass effect. Vascular: No hyperdense vessel or unexpected calcification. Skull: Normal. Negative for fracture or focal lesion. Soft tissue swelling over the left parietal scalp Sinuses/Orbits: Bilateral lens  replacement.Frothy secretions right  maxillary sinus. Other: None. IMPRESSION: 1. No acute intracranial abnormality. 2. Soft tissue swelling over the left parietal scalp. No underlying fracture. 3. Frothy secretions right maxillary sinus. Correlate for acute sinusitis. Electronically Signed   By: Marin Roberts M.D.   On: 10/06/2022 15:51   DG Chest Port 1 View  Result Date: 10/06/2022 CLINICAL DATA:  Question sepsis.  Fell today. EXAM: PORTABLE CHEST 1 VIEW COMPARISON:  08/09/2022 FINDINGS: Artifact overlies the chest. Density related to bilateral breast implants. The right chest appears clear allowing for that. Abnormal density in the left lower chest could be due to left lower lobe infiltrate/collapse/effusion. Old healed left humeral fracture. IMPRESSION: Abnormal density in the left lower chest that could be due to left lower lobe infiltrate/collapse/effusion. Electronically Signed   By: Nelson Chimes M.D.   On: 10/06/2022 14:03            LOS: 7 days       Emeterio Reeve, DO [facility] 10/15/2022, 2:14 PM   Staff may message me via secure chat in [place]  but this may not receive immediate response,  please page for urgent matters!  If 7PM-7AM, please contact night-coverage [email or web address]  Dictation software was used to generate the above note. Typos may occur and escape review, as with typed/written notes. Please contact Dr Sheppard Coil directly for clarity if needed.

## 2022-10-15 NOTE — Progress Notes (Signed)
Patient nephew Ronalee Belts who is POA would like a call with update of patient condition from MD [phone].

## 2022-10-15 NOTE — Progress Notes (Signed)
OT Cancellation Note  Patient Details Name: Donna Evans MRN: [id number] DOB: [birth year]   Cancelled Treatment:    Reason Eval/Treat Not Completed: Medical issues which prohibited therapy. Chart reviewed. Pt noted to have transferred to higher level of care, per MD plan to hold therapy pending cardiology update. Please place new orders when pt ready for therapy services.   Dessie Coma, M.S. OTR/L  10/15/22, 1:58 PM  ascom [phone]

## 2022-10-15 NOTE — TOC Benefit Eligibility Note (Signed)
Patient Teacher, English as a foreign language completed.    The patient is currently admitted and upon discharge could be taking Eliquis 5 mg.  The current 30 day co-pay is $45.00.   The patient is insured through [place], [place] Patient Advocate Specialist [place] Patient Advocate Team Direct Number: [phone]  Fax: [phone]

## 2022-10-16 DIAGNOSIS — I480 Paroxysmal atrial fibrillation: Secondary | ICD-10-CM

## 2022-10-16 DIAGNOSIS — W1830XA Fall on same level, unspecified, initial encounter: Secondary | ICD-10-CM | POA: Diagnosis not present

## 2022-10-16 DIAGNOSIS — R404 Transient alteration of awareness: Secondary | ICD-10-CM

## 2022-10-16 DIAGNOSIS — I890 Lymphedema, not elsewhere classified: Secondary | ICD-10-CM | POA: Diagnosis not present

## 2022-10-16 LAB — BASIC METABOLIC PANEL
Anion gap: 8 (ref 5–15)
BUN: 36 mg/dL — ABNORMAL HIGH (ref 8–23)
CO2: 28 mmol/L (ref 22–32)
Calcium: 8.5 mg/dL — ABNORMAL LOW (ref 8.9–10.3)
Chloride: 99 mmol/L (ref 98–111)
Creatinine, Ser: 1.3 mg/dL — ABNORMAL HIGH (ref 0.44–1.00)
GFR, Estimated: 43 mL/min — ABNORMAL LOW (ref >60)
Glucose, Bld: 116 mg/dL — ABNORMAL HIGH (ref 70–99)
Potassium: 3.6 mmol/L (ref 3.5–5.1)
Sodium: 135 mmol/L (ref 135–145)

## 2022-10-16 LAB — CBC
HCT: 36.6 % (ref 36.0–46.0)
Hemoglobin: 12.2 g/dL (ref 12.0–15.0)
MCH: 31 pg (ref 26.0–34.0)
MCHC: 33.3 g/dL (ref 30.0–36.0)
MCV: 92.9 fL (ref 80.0–100.0)
Platelets: 244 10*3/uL (ref 150–400)
RBC: 3.94 MIL/uL (ref 3.87–5.11)
RDW: 14 % (ref 11.5–15.5)
WBC: 7.2 10*3/uL (ref 4.0–10.5)
nRBC: 0 % (ref 0.0–0.2)

## 2022-10-16 LAB — HEPARIN LEVEL (UNFRACTIONATED): Heparin Unfractionated: 0.57 IU/mL (ref 0.30–0.70)

## 2022-10-16 MED ORDER — APIXABAN 5 MG PO TABS
5.0000 mg | ORAL_TABLET | Freq: Two times a day (BID) | ORAL | Status: DC
  Administered 2022-10-16 – 2022-10-18 (×5): 5 mg via ORAL
  Filled 2022-10-16 (×5): qty 1

## 2022-10-16 NOTE — TOC Progression Note (Signed)
Transition of Care [facility]) - Progression Note    Patient Details  Name: Donna Evans MRN: [id number] Date of Birth: 06/28/1946  Transition of Care [facility]) CM/SW Contact  Izola Price, RN Phone Number: 10/16/2022, 1:57 PM  Clinical Narrative: 11/4: Per provider, patient may pivot to [facility] on discharge now. Appeal of denial still pending--no documentation or work from [place] today. Patient remains on cardiac gtt and per provider will DC not today anyway. TOC will follow for discharge planning needs. Simmie Davies RN CM     Expected Discharge Plan: [place] Barriers to Discharge: Continued Medical Work up  Expected Discharge Plan and Services Expected Discharge Plan: [place] arrangements for the past 2 months: Single Family Home                           HH Arranged: RN, PT, OT [facility] Agency: [place] Date [place]: 10/08/22   Representative spoke with at [employer]: [place]   Social Determinants of Health ([place]) Interventions    Readmission Risk Interventions     No data to display

## 2022-10-16 NOTE — Consult Note (Signed)
ANTICOAGULATION CONSULT NOTE  Pharmacy Consult for Heparin Infusion Indication: atrial fibrillation  Allergies  Allergen Reactions   Ciprofloxacin Nausea And Vomiting and Hives   Codeine Anaphylaxis and Hives    Reaction:  Unknown    Demerol [Meperidine] Anaphylaxis and Hives    Reaction:  Unknown     Fluconazole Hives   Latex Anaphylaxis and Rash   Morphine And Related Hives   Doxycycline Nausea And Vomiting   Flagyl [Metronidazole] Nausea And Vomiting    Reaction:  Unknown    Influenza Vaccines Swelling    Localized swelling   Tape Other (See Comments)    Pt allergic to Adhesive tape and latex.(  Skin breaks out)   Valtrex [Valacyclovir Hcl] Hives   Buprenorphine     Other reaction(s): Other (see comments) unknown   Duloxetine Hcl Other (See Comments)    Reaction:  Unknown    Tramadol     Other reaction(s): Not available   Amoxicillin-Pot Clavulanate Nausea And Vomiting   Sulfamethoxazole-Trimethoprim Nausea And Vomiting   Valacyclovir Rash    Patient Measurements: Height: '5\' 5"'$  (165.1 cm) Weight: 64 kg (141 lb 1.5 oz) IBW/kg (Calculated) : 57 Heparin Dosing Weight: 64 kg  Vital Signs: Temp: 98 F (36.7 C) (11/03 2300) Temp Source: Oral (11/03 2300) BP: 104/59 (11/03 2300) Pulse Rate: 86 (11/03 2300)  Labs: Recent Labs    10/14/22 1421 10/14/22 1629 10/14/22 2236 10/15/22 0426 10/15/22 0847 10/15/22 1824 10/16/22 0224  HGB 12.5  --   --   --   --   --  12.2  HCT 37.8  --   --   --   --   --  36.6  PLT 239  --   --   --   --   --  244  HEPARINUNFRC  --   --    < >  --  0.71* 0.47 0.57  CREATININE  --  1.34*  --  1.20*  --   --  1.30*  TROPONINIHS 35* 41*  --   --   --   --   --    < > = values in this interval not displayed.     Estimated Creatinine Clearance: 33.1 mL/min (A) (by C-G formula based on SCr of 1.3 mg/dL (H)).   Medical History: Past Medical History:  Diagnosis Date   Allergic state    Anginal pain ([place])    Prinzmetal's angina    Anxiety    Arthritis    osteoarthritis   Breast cancer ([place]) 1990   right breast cancer   Chronic pain    Chronic pain    COPD (chronic obstructive pulmonary disease) (HCC)    Depression    Edema    Fibromyalgia    Foot fracture    Bilateral   GERD (gastroesophageal reflux disease)    History of kidney stones    Hypertension    Leg fracture, right    Low back pain    Lumbosacral neuritis    Medulloadrenal hyperfunction (HCC)    Migraine headache    Peripheral neuropathy    Shoulder fracture, right    Stroke ([place])    TIA   Systemic lupus erythematosus ([place])    Thyroid disease    TIA (transient ischemic attack) 11/28/2013   Transient global amnesia 2011    Medications:  Scheduled:   vitamin B-12  1,000 mcg Oral Daily   folic acid  1 mg Oral Daily   hydrochlorothiazide  12.5 mg  Oral Daily   metoprolol tartrate  25 mg Oral Q8H   multivitamin with minerals  1 tablet Oral Daily   rosuvastatin  10 mg Oral Daily   sodium chloride flush  3 mL Intravenous Q12H   Infusions:   diltiazem (CARDIZEM) infusion 5 mg/hr (10/15/22 1933)   heparin 800 Units/hr (10/15/22 1538)   PRN: acetaminophen **OR** acetaminophen, ALPRAZolam, HYDROcodone-acetaminophen, metoprolol tartrate, ondansetron **OR** ondansetron (ZOFRAN) IV  Assessment: Donna Evans is a 76 y.o. female presenting with new onset Afib with RVR (HR up to 171). PMH significant for HTN, CVA, depression/anxiety, HFpEF, CKD3a, COPD, SLE. Patient was not on [facility] PTA per chart review. Patient was given Cardizem '10mg'$  IV x 1 with plans to transfer to the cardiac progressive unit for closer monitoring. Pharmacy has been consulted to initiate and manage heparin infusion.   Baseline Labs 10/25: aPTT 28, PT 12.6, INR 1.0, Hgb 12.7, Hct 32.7, Plt 219   Goal of Therapy:  Heparin level 0.3-0.7 units/ml Monitor platelets by anticoagulation protocol: Yes  Date Time HL Rate/Comment  11/2  2236   0.76 SUPRAtherapeutic 11/3 0847 0.71 SUPRAtherapeutic 11/4 1824 0.47 Therapeutic x1; 800 un/hr 11/5 0224 0.57 Therapeutic x 2   Plan:  Continue heparin infusion at 800 units/hr Recheck HL daily w/ AM labs while therapeutic Continue to monitor H&H and platelets daily while on heparin   Renda Rolls, PharmD, [facility] 10/16/2022 3:40 AM

## 2022-10-16 NOTE — Progress Notes (Signed)
[facility] Cardiology    SUBJECTIVE: Patient feeling much improved no lightheaded dizziness vertigo no blackout spells or syncope no chest pain or shortness of breath   Vitals:   10/15/22 2300 10/16/22 0600 10/16/22 0745 10/16/22 1121  BP: (!) 104/59 121/66 104/62 (!) 101/56  Pulse: 86 (!) 55 69 70  Resp: '19 13 12 15  '$ Temp: 98 F (36.7 C)  98.1 F (36.7 C) 97.9 F (36.6 C)  TempSrc: Oral  Oral Oral  SpO2: 92% 90% 92% 91%  Weight:      Height:         Intake/Output Summary (Last 24 hours) at 10/16/2022 1242 Last data filed at 10/16/2022 0300 Gross per 24 hour  Intake 743.13 ml  Output --  Net 743.13 ml      PHYSICAL EXAM  General: Well developed, well nourished, in no acute distress HEENT:  Normocephalic and atramatic Neck:  No JVD.  Lungs: Clear bilaterally to auscultation and percussion. Heart: HRRR . Normal S1 and S2 without gallops or murmurs.  Abdomen: Bowel sounds are positive, abdomen soft and non-tender  Msk:  Back normal, normal gait. Normal strength and tone for age. Extremities: No clubbing, cyanosis or edema.   Neuro: Alert and oriented X 3. Psych:  Good affect, responds appropriately   LABS: Basic Metabolic Panel: Recent Labs    10/14/22 1421 10/14/22 1629 10/15/22 0426 10/16/22 0224  NA  --    < > 135 135  K  --    < > 3.6 3.6  CL  --    < > 98 99  CO2  --    < > 25 28  GLUCOSE  --    < > 90 116*  BUN  --    < > 33* 36*  CREATININE  --    < > 1.20* 1.30*  CALCIUM  --    < > 8.6* 8.5*  MG 2.1  --   --   --    < > = values in this interval not displayed.   Liver Function Tests: No results for input(s): "AST", "ALT", "ALKPHOS", "BILITOT", "PROT", "ALBUMIN" in the last 72 hours. No results for input(s): "LIPASE", "AMYLASE" in the last 72 hours. CBC: Recent Labs    10/14/22 1421 10/16/22 0224  WBC 7.2 7.2  HGB 12.5 12.2  HCT 37.8 36.6  MCV 92.4 92.9  PLT 239 244   Cardiac Enzymes: No results for input(s): "CKTOTAL", "CKMB", "CKMBINDEX",  "TROPONINI" in the last 72 hours. BNP: Invalid input(s): "POCBNP" D-Dimer: No results for input(s): "DDIMER" in the last 72 hours. Hemoglobin A1C: No results for input(s): "HGBA1C" in the last 72 hours. Fasting Lipid Panel: No results for input(s): "CHOL", "HDL", "LDLCALC", "TRIG", "CHOLHDL", "LDLDIRECT" in the last 72 hours. Thyroid Function Tests: Recent Labs    10/14/22 1421  TSH 2.732   Anemia Panel: No results for input(s): "VITAMINB12", "FOLATE", "FERRITIN", "TIBC", "IRON", "RETICCTPCT" in the last 72 hours.  ECHOCARDIOGRAM COMPLETE  Result Date: 10/15/2022    ECHOCARDIOGRAM REPORT   Patient Name:   Donna Evans Date of Exam: 10/14/2022 Medical Rec #:  [id number]      Height:       65.0 in Accession #:    [id number]     Weight:       141.1 lb Date of Birth:  [birth year]       BSA:          1.706 m Patient Age:    76 years  BP:           122/97 mmHg Patient Gender: F              HR:           145 bpm. Exam Location:  [facility] Procedure: 2D Echo, Color Doppler and Cardiac Doppler Indications:     I48.91 Atrial fibrillation  History:         Patient has prior history of Echocardiogram examinations, most                  recent 04/29/2022. COPD and Stroke; Risk Factors:Hypertension.  Sonographer:     Charmayne Sheer Referring Phys:  [id number] Emeterio Reeve Diagnosing Phys: Yolonda Kida MD  Sonographer Comments: Suboptimal parasternal window and suboptimal apical window. Image acquisition challenging due to breast implants and Image acquisition challenging due to COPD. IMPRESSIONS  1. TDS.  2. Left ventricular ejection fraction, by estimation, is 55 to 60%. The left ventricle has normal function. The left ventricle has no regional wall motion abnormalities. There is mild left ventricular hypertrophy. Left ventricular diastolic function could not be evaluated.  3. Right ventricular systolic function is normal. The right ventricular size is normal.  4. The mitral valve is normal in structure. No  evidence of mitral valve regurgitation.  5. The aortic valve is normal in structure. Aortic valve regurgitation is not visualized. Conclusion(s)/Recommendation(s): Poor windows for evaluation of left ventricular function by transthoracic echocardiography. Would recommend an alternative means of evaluation. FINDINGS  Left Ventricle: Left ventricular ejection fraction, by estimation, is 55 to 60%. The left ventricle has normal function. The left ventricle has no regional wall motion abnormalities. The left ventricular internal cavity size was normal in size. There is  mild left ventricular hypertrophy. Left ventricular diastolic function could not be evaluated. Right Ventricle: The right ventricular size is normal. No increase in right ventricular wall thickness. Right ventricular systolic function is normal. Left Atrium: Left atrial size was normal in size. Right Atrium: Right atrial size was normal in size. Pericardium: There is no evidence of pericardial effusion. Mitral Valve: The mitral valve is normal in structure. No evidence of mitral valve regurgitation. Tricuspid Valve: The tricuspid valve is normal in structure. Tricuspid valve regurgitation is trivial. Aortic Valve: The aortic valve is normal in structure. Aortic valve regurgitation is not visualized. Aortic valve mean gradient measures 2.0 mmHg. Aortic valve peak gradient measures 3.8 mmHg. Aortic valve area, by VTI measures 2.63 cm. Pulmonic Valve: The pulmonic valve was grossly normal. Pulmonic valve regurgitation is not visualized. Aorta: The ascending aorta was not well visualized. IAS/Shunts: No atrial level shunt detected by color flow Doppler. Additional Comments: TDS.  LEFT VENTRICLE PLAX 2D LVIDd:         3.40 cm   Diastology LVIDs:         2.30 cm   LV e' medial:    6.42 cm/s LV PW:         1.40 cm   LV E/e' medial:  12.6 LV IVS:        1.00 cm   LV e' lateral:   9.90 cm/s LVOT diam:     1.60 cm   LV E/e' lateral: 8.2 LV SV:         35 LV SV  Index:   21 LVOT Area:     2.01 cm  RIGHT VENTRICLE RV Basal diam:  2.20 cm LEFT ATRIUM           Index  RIGHT ATRIUM          Index LA diam:      3.00 cm 1.76 cm/m   RA Area:     9.16 cm LA Vol (A2C): 14.7 ml 8.62 ml/m   RA Volume:   19.40 ml 11.37 ml/m LA Vol (A4C): 26.5 ml 15.54 ml/m  AORTIC VALVE                    PULMONIC VALVE AV Area (Vmax):    2.53 cm     PV Vmax:       1.15 m/s AV Area (Vmean):   2.52 cm     PV Vmean:      81.200 cm/s AV Area (VTI):     2.63 cm     PV VTI:        0.141 m AV Vmax:           97.80 cm/s   PV Peak grad:  5.3 mmHg AV Vmean:          69.600 cm/s  PV Mean grad:  3.0 mmHg AV VTI:            0.134 m AV Peak Grad:      3.8 mmHg AV Mean Grad:      2.0 mmHg LVOT Vmax:         123.00 cm/s LVOT Vmean:        87.100 cm/s LVOT VTI:          0.175 m LVOT/AV VTI ratio: 1.31  AORTA Ao Root diam: 3.10 cm MITRAL VALVE MV Area (PHT): 9.85 cm    SHUNTS MV Decel Time: 77 msec     Systemic VTI:  0.18 m MV E velocity: 80.70 cm/s  Systemic Diam: 1.60 cm Ilyse Tremain D Melroy Bougher MD Electronically signed by Yolonda Kida MD Signature Date/Time: 10/15/2022/1:18:38 PM    Final      Echo preserved left ventricular function EF around 55%  TELEMETRY: Rate 85:  ASSESSMENT AND PLAN:  Principal Problem:   Acute encephalopathy Active Problems:   Ground-level fall   Lymphedema   Elevated LFTs   Altered mental status   Bilateral leg edema   Community acquired pneumonia of left lower lobe of lung   Head injury   Delirium due to another medical condition   Paroxysmal atrial fibrillation with RVR (HCC)    Plan Altered mental status possible syncope resolved patient alert oriented feels much improved Atrial fibrillation rapid ventricular response on diltiazem heparin preserved left ventricular function recommend metoprolol follow-up laboratories recommend rate control anticoagulation upon discharge Acute encephalopathy resolved with evidence of mild history of multiple falls  unclear etiology of recent encephalopathy but improved and stable no direct medical therapy Elevated in fatty liver resulting in elevation of LFTs continue conservative management Continue hypertension management and control Increase activity and hold in anticipation of hopefully discharge home soon    Yolonda Kida, MD, 10/16/2022 12:42 PM

## 2022-10-16 NOTE — Progress Notes (Signed)
PROGRESS NOTE    Donna Evans   EVO:[id number] DOB: 03-Aug-1946  DOA: 10/06/2022 Date of Service: 10/16/22 PCP: Jodi Marble, MD     Brief Narrative / Hospital Course:  Donna Evans is a 76 y.o. female with a PMH significant for hypertension, CVA, depression with anxiety, migraine headache, right breast cancer s/p mastectomy, chronic pain syndrome, HFpEF, CKD stage IIIa, COPD, SLE. Presented from home to the ED on 10/06/2022 with period of unwitnessed unconsciousness less than a day which she does not recall. She was found on the floor by a friend and was speaking incoherently.  In the ED, BP 169/76 with heart rate of 70.  She was hypothermic at 95.3. Initial work-up remarkable for normal CBC, lactic acid, troponin, and CK.  CMP notable for potassium of 3.1, AST of 15, alkaline phosphatase of 638, total bili of 1.3.  CT head was obtained that did not show any acute abnormality other than soft tissue swelling over the left parietal scalp. Brain MRI negative for acute infarct. Initially treated with Abx and IV fluids as well as electrolyte replacement. Patient was admitted to medicine service for further workup and management of AMS  Given history of previous similar episodes, normal imaging in ED, EEG was repeated and was also negative.  Allergy evaluated, concluded overall confusion but is delirium on dementia and have signed off.  Holding/decreasing sedating medications as able. 10/12/22 - patient is agreeable to SNF. Insurance has denied. Family plans to appeal and attending physician Dr Ouida Sills agreed with that - stating patient was not appropriate to go home at this time.  11/01 - remains significantly weak, still pending nephew calling insurance for appeal, see TOC note from today, the time sensitivity was stressed to him.  11/02: New Afib RVR - to progressive, trial diltiazem IV x1 --> did not convert, will need cardizem gtt and heparin gtt. Will repeat Echo. HR not responding,  consulted cardiology Dr Clayborn Bigness advised metoprolol '5mg'$  IV Q15 minutes x 3 if heart rate > 120 and Bp >100. Metoprolol tartrate 25 mg po Q8 hr starting now. Consider amiodarone if still not responding.   11/03: remains on cardizem gtt and heparin gtt and HR remains elevated this morning, but improved and off cardizem gtt in the afternoon. Cardiology saw patient in AM - contineu rate control, consider anticoag and rate control for Afib on discharge, modest diuresis would be appropriate, conservative cardiac management at this time.  11/04: this morning still on cardizem and heparin. Per RN gets significantly tachy any time she ambulates - await cardiology recs re: d/c drip. Has been on heparin approx 48 h can likely transition to eliquis today   Consultants:  Neurology   Procedures: EEG      ASSESSMENT & PLAN:   Principal Problem:   Acute encephalopathy Active Problems:   Ground-level fall   Lymphedema   Elevated LFTs   Altered mental status   Bilateral leg edema   Community acquired pneumonia of left lower lobe of lung   Head injury   Delirium due to another medical condition   Paroxysmal atrial fibrillation with RVR (HCC)  Paroxysmal atrial fibrillation with RVR (HCC) Diltiazem gtt --> remains on this pending cardiology recs heparin gtt --> start eliquis today  Echo --> EF 55-60%, no RWMA, unable to eval diastolic fxn  Check troponin, TSH, BMP, Mg, CBC --> no concerns  Metoprolol tartrate 25 mg po Q8 hr  Cardiology following.   Plan anticoag and rate control on  discharge   Acute encephalopathy resolved - earlier in admission w/ fluctuating delirium. She has periods of disorientation and lack of capacity.  Mild to Moderate dementia H/o falls and balance issues H/o pelvic and radial fractures from previous fall Repeat EEG negative.  Neurology evaluated and concluded is delirium on dementia. Neuro has signed off. No further recommendations.  Frequent neurochecks,  re-orientation B12  continue to hold/decrease sedating medications as able Holding home Flexeril and Ambien PT/OT recommending SNF at dc, TOC engaged. delirium precautions    Elevated LFTs - likely fatty liver  Per chart review, patient has a history of elevated alkaline phosphatase dating back to 2014 with new elevation in AST dating back to August 13, 2022 that is improving.  No known alcohol use.  GGT --> elevated.  RUQ [place] --> significant for diffuse liver increased echogenicity could be sign of fatty liver vs viral infection.  Hepatitis panel --> negative.   HTN  HLD well controlled on home medications   Hx HFrEF euvolemic on exam   Body mass index is 23.48 kg/m.     DVT prophylaxis: heparin gtt --> plan DOAC Pertinent IV fluids/nutrition: no continuous IV fluids  Central lines / invasive devices: none  Code Status: DNR Family Communication: spoke w/ nephew yesterday 11/03  Disposition: remains inpatient TOC needs: waiting on nephew to appeal for SNF Barriers to discharge / significant pending items: waiting on nephew to appeal for SNF, if unable to arrange this will need to d/c home w/ [facility] however new Afib will need to be consistently rate controlled off drip before can discharge              Subjective:  Patient reports palpitations are resolved but she has some anxiety, no chest pain or difficulty breathing.        Objective:  Vitals:   10/15/22 2300 10/16/22 0600 10/16/22 0745 10/16/22 1121  BP: (!) 104/59 121/66 104/62 (!) 101/56  Pulse: 86 (!) 55 69 70  Resp: '19 13 12 15  '$ Temp: 98 F (36.7 C)  98.1 F (36.7 C) 97.9 F (36.6 C)  TempSrc: Oral  Oral Oral  SpO2: 92% 90% 92% 91%  Weight:      Height:        Intake/Output Summary (Last 24 hours) at 10/16/2022 1348 Last data filed at 10/16/2022 0300 Gross per 24 hour  Intake 743.13 ml  Output --  Net 743.13 ml    Filed Weights   10/06/22 1312  Weight: 64 kg     Examination: Constitutional:  VS as above General Appearance: alert, well-developed, well-nourished, NAD Respiratory: Normal respiratory effort No wheeze No rhonchi No rales Cardiovascular: Irreg irreg and rate better in 80s-90s while resting  No lower extremity edema Gastrointestinal: No tenderness Musculoskeletal:  No clubbing/cyanosis of digits Symmetrical movement in all extremities Neurological: No cranial nerve deficit on limited exam Alert Psychiatric: Normal judgment/insight Normal mood and affect       Scheduled Medications:   vitamin B-12  1,000 mcg Oral Daily   folic acid  1 mg Oral Daily   hydrochlorothiazide  12.5 mg Oral Daily   metoprolol tartrate  25 mg Oral Q8H   multivitamin with minerals  1 tablet Oral Daily   rosuvastatin  10 mg Oral Daily   sodium chloride flush  3 mL Intravenous Q12H    Continuous Infusions:  diltiazem (CARDIZEM) infusion 5 mg/hr (10/15/22 1933)   heparin 800 Units/hr (10/15/22 1538)    PRN Medications:  acetaminophen **OR**  acetaminophen, ALPRAZolam, HYDROcodone-acetaminophen, metoprolol tartrate, ondansetron **OR** ondansetron (ZOFRAN) IV  Antimicrobials:  Anti-infectives (From admission, onward)    Start     Dose/Rate Route Frequency Ordered Stop   10/06/22 1600  cefTRIAXone (ROCEPHIN) 1 g in sodium chloride 0.9 % 100 mL IVPB        1 g 200 mL/hr over 30 Minutes Intravenous  Once 10/06/22 1556 10/06/22 1637   10/06/22 1600  azithromycin (ZITHROMAX) 500 mg in sodium chloride 0.9 % 250 mL IVPB        500 mg 250 mL/hr over 60 Minutes Intravenous  Once 10/06/22 1556 10/06/22 1807       Data Reviewed: I have personally reviewed following labs and imaging studies  CBC: Recent Labs  Lab 10/12/22 1003 10/14/22 1421 10/16/22 0224  WBC 8.0 7.2 7.2  HGB 12.2 12.5 12.2  HCT 36.9 37.8 36.6  MCV 91.8 92.4 92.9  PLT 207 239 829   Basic Metabolic Panel: Recent Labs  Lab 10/12/22 1003 10/13/22 0613  10/14/22 1421 10/14/22 1629 10/15/22 0426 10/16/22 0224  NA 135 137  --  137 135 135  K 3.0* 3.3*  --  3.9 3.6 3.6  CL 97* 98  --  98 98 99  CO2 27 30  --  '26 25 28  '$ GLUCOSE 87 91  --  99 90 116*  BUN 20 31*  --  31* 33* 36*  CREATININE 1.32* 1.57*  --  1.34* 1.20* 1.30*  CALCIUM 8.5* 8.6*  --  9.0 8.6* 8.5*  MG 2.3  --  2.1  --   --   --    GFR: Estimated Creatinine Clearance: 33.1 mL/min (A) (by C-G formula based on SCr of 1.3 mg/dL (H)). Liver Function Tests: No results for input(s): "AST", "ALT", "ALKPHOS", "BILITOT", "PROT", "ALBUMIN" in the last 168 hours.  No results for input(s): "LIPASE", "AMYLASE" in the last 168 hours. No results for input(s): "AMMONIA" in the last 168 hours.  Coagulation Profile: No results for input(s): "INR", "PROTIME" in the last 168 hours.  Cardiac Enzymes: No results for input(s): "CKTOTAL", "CKMB", "CKMBINDEX", "TROPONINI" in the last 168 hours. BNP (last 3 results) No results for input(s): "PROBNP" in the last 8760 hours. HbA1C: No results for input(s): "HGBA1C" in the last 72 hours. CBG: Recent Labs  Lab 10/09/22 1635 10/10/22 0827 10/10/22 1250 10/10/22 1641  GLUCAP 101* 79 89 87   Lipid Profile: No results for input(s): "CHOL", "HDL", "LDLCALC", "TRIG", "CHOLHDL", "LDLDIRECT" in the last 72 hours. Thyroid Function Tests: Recent Labs    10/14/22 1421  TSH 2.732   Anemia Panel: No results for input(s): "VITAMINB12", "FOLATE", "FERRITIN", "TIBC", "IRON", "RETICCTPCT" in the last 72 hours. Urine analysis:    Component Value Date/Time   COLORURINE YELLOW (A) 10/06/2022 1328   APPEARANCEUR CLEAR (A) 10/06/2022 1328   LABSPEC 1.013 10/06/2022 1328   PHURINE 7.0 10/06/2022 1328   GLUCOSEU NEGATIVE 10/06/2022 1328   HGBUR NEGATIVE 10/06/2022 [place] 10/06/2022 1328   BILIRUBINUR neg 11/10/2015 1352   KETONESUR NEGATIVE 10/06/2022 1328   PROTEINUR 30 (A) 10/06/2022 1328   UROBILINOGEN negative 11/10/2015  1352   UROBILINOGEN 0.2 11/20/2013 0201   NITRITE NEGATIVE 10/06/2022 1328   LEUKOCYTESUR NEGATIVE 10/06/2022 1328   Sepsis Labs: '@LABRCNTIP'$ (procalcitonin:4,lacticidven:4)  Recent Results (from the past 240 hour(s))  Resp Panel by RT-PCR (Flu A&B, Covid) Anterior Nasal Swab     Status: None   Collection Time: 10/06/22  4:14 PM   Specimen: Anterior  Nasal Swab  Result Value Ref Range Status   SARS Coronavirus 2 by RT PCR NEGATIVE NEGATIVE Final    Comment: (NOTE) SARS-CoV-2 target nucleic acids are NOT DETECTED.  The SARS-CoV-2 RNA is generally detectable in upper respiratory specimens during the acute phase of infection. The lowest concentration of SARS-CoV-2 viral copies this assay can detect is 138 copies/mL. A negative result does not preclude SARS-Cov-2 infection and should not be used as the sole basis for treatment or other patient management decisions. A negative result may occur with  improper specimen collection/handling, submission of specimen other than nasopharyngeal swab, presence of viral mutation(s) within the areas targeted by this assay, and inadequate number of viral copies(<138 copies/mL). A negative result must be combined with clinical observations, patient history, and epidemiological information. The expected result is Negative.  Fact Sheet for Patients:  EntrepreneurPulse.com.au  Fact Sheet for Healthcare Providers:  IncredibleEmployment.be  This test is no t yet approved or cleared by the [place] FDA and  has been authorized for detection and/or diagnosis of SARS-CoV-2 by FDA under an Emergency Use Authorization (EUA). This EUA will remain  in effect (meaning this test can be used) for the duration of the COVID-19 declaration under Section 564(b)(1) of the Act, 21 U.S.C.section 360bbb-3(b)(1), unless the authorization is terminated  or revoked sooner.       Influenza A by PCR NEGATIVE NEGATIVE Final    Influenza B by PCR NEGATIVE NEGATIVE Final    Comment: (NOTE) The Xpert Xpress SARS-CoV-2/FLU/RSV plus assay is intended as an aid in the diagnosis of influenza from Nasopharyngeal swab specimens and should not be used as a sole basis for treatment. Nasal washings and aspirates are unacceptable for Xpert Xpress SARS-CoV-2/FLU/RSV testing.  Fact Sheet for Patients: EntrepreneurPulse.com.au  Fact Sheet for Healthcare Providers: IncredibleEmployment.be  This test is not yet approved or cleared by the [place] FDA and has been authorized for detection and/or diagnosis of SARS-CoV-2 by FDA under an Emergency Use Authorization (EUA). This EUA will remain in effect (meaning this test can be used) for the duration of the COVID-19 declaration under Section 564(b)(1) of the Act, 21 U.S.C. section 360bbb-3(b)(1), unless the authorization is terminated or revoked.  Performed at [facility], [address] [place], [place] [address]   CSF culture w Gram Stain     Status: None   Collection Time: 10/08/22  3:30 PM   Specimen: CSF; Cerebrospinal Fluid  Result Value Ref Range Status   Specimen Description   Final    CSF Performed at [facility], [address] [place], [place] [address]    Special Requests   Final    NONE Performed at [facility], [address] [place], [place] [address]    Gram Stain   Final    NO ORGANISMS SEEN WBC SEEN RED BLOOD CELLS SEEN Performed at [facility], [address], [place], [place] [address]    Culture   Final    NO GROWTH 3 DAYS Performed at [facility], [place] [address], [place], [place] [address]    Report Status 10/11/2022 FINAL  Final         Radiology Studies: CT HEAD WO CONTRAST (5MM)  Result Date: 10/07/2022 CLINICAL DATA:  Altered mental status EXAM: CT HEAD WITHOUT CONTRAST TECHNIQUE: Contiguous axial images were obtained from the base of  the skull through the vertex without intravenous contrast. RADIATION DOSE REDUCTION: This exam was performed according to the departmental dose-optimization  program which includes automated exposure control, adjustment of the mA and/or kV according to patient size and/or use of iterative reconstruction technique. COMPARISON:  10/06/2022 FINDINGS: Brain: No acute intracranial findings are seen. There are no signs of bleeding within the cranium. Ventricles are not dilated. Cortical sulci are prominent. There is decreased density in periventricular white matter. Vascular: Unremarkable. Skull: Unremarkable. Sinuses/Orbits: There is mucosal thickening in right maxillary sinus. There is mucosal thickening and frothy density in right side of sphenoid sinus. Other: None. IMPRESSION: No acute intracranial findings are seen in noncontrast CT brain. Atrophy. Small-vessel disease. Electronically Signed   By: Elmer Picker M.D.   On: 10/07/2022 15:51   EEG adult  Result Date: 10/07/2022 Lora Havens, MD     10/07/2022 12:28 PM Patient Name: Donna Evans MRN: [id number] Epilepsy Attending: Lora Havens Referring Physician/Provider: Jose Persia, MD Date: 10/07/2022 Duration: 31.51 mins Patient history: 76 year old female with altered mental status.  EEG to eval for seizure. Level of alertness: Awake AEDs during EEG study: None Technical aspects: This EEG study was done with scalp electrodes positioned according to the 10-20 International system of electrode placement. Electrical activity was reviewed with band pass filter of 1-'70Hz'$ , sensitivity of 7 uV/mm, display speed of 40m/sec with a '60Hz'$  notched filter applied as appropriate. EEG data were recorded continuously and digitally stored.  Video monitoring was available and reviewed as appropriate. Description: The posterior dominant rhythm consists of 8-9 Hz activity of moderate voltage (25-35 uV) seen predominantly in posterior head regions, symmetric  and reactive to eye opening and eye closing. Physiologic photic driving was seen during photic stimulation. Hyperventilation was not performed.   IMPRESSION: This study is within normal limits. No seizures or epileptiform discharges were seen throughout the recording. A normal interictal EEG does not exclude the diagnosis of epilepsy. Priyanka OBarbra Sarks  [place]dom[place] Limited RUQ (LIVER/GB)  Result Date: 10/07/2022 CLINICAL DATA:  Transaminitis EXAM: ULTRASOUND ABDOMEN LIMITED RIGHT UPPER QUADRANT COMPARISON:  CT abdomen pelvis 09/24/2009 FINDINGS: Gallbladder: Surgically absent Common bile duct: Diameter: 3 mm Liver: Parenchymal echogenicity: Mild diffuse increase echogenicity. Contours: Normal Lesions: None Portal vein: Patent.  Hepatopetal flow Other: None. IMPRESSION: Mild diffuse increased echogenicity of the hepatic parenchyma is a nonspecific indicator of hepatocellular dysfunction, most commonly steatosis. Electronically Signed   By: FMiachel RouxM.D.   On: 10/07/2022 09:36   [place]ous Img Lower Bilateral (DVT)  Result Date: 10/07/2022 CLINICAL DATA:  Syncope EXAM: Bilateral lower Extremity Venous Doppler Ultrasound TECHNIQUE: Gray-scale sonography with compression, as well as color and duplex ultrasound, were performed to evaluate the deep venous system(s) from the level of the common femoral vein through the popliteal and proximal calf veins. COMPARISON:  04/29/2022 FINDINGS: VENOUS Normal compressibility of the common femoral, superficial femoral, and popliteal veins, as well as the visualized calf veins. Visualized portions of profunda femoral vein and great saphenous vein unremarkable. No filling defects to suggest DVT on grayscale or color Doppler imaging. Doppler waveforms show normal direction of venous flow, normal respiratory plasticity and response to augmentation. OTHER None. Limitations: none IMPRESSION: No lower extremity DVT. Electronically Signed   By: FMiachel RouxM.D.   On: 10/07/2022  09:34   DG Ribs Unilateral Right  Result Date: 10/06/2022 CLINICAL DATA:  [place] right rib fractures EXAM: RIGHT RIBS - 2 VIEW COMPARISON:  Shoulder x-ray 10/06/2022, chest x-ray 10/06/2022 FINDINGS: Frontal and oblique views of the right thoracic cage are obtained. Right breast prosthesis obscures portions of the  underlying ribs. Right chest is clear without airspace disease, effusion, or pneumothorax. There are chronic healed right posterior eighth and ninth rib fractures. 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 There are no acute displaced fractures. IMPRESSION: 1. Multiple chronic right rib fractures as above. No acute bony abnormality. Electronically Signed   By: Randa Ngo M.D.   On: 10/06/2022 19:47   MR BRAIN WO CONTRAST  Result Date: 10/06/2022 CLINICAL DATA:  Mental status change.  Fall today. EXAM: MRI HEAD WITHOUT CONTRAST TECHNIQUE: Multiplanar, multiecho pulse sequences of the brain and surrounding structures were obtained without intravenous contrast. COMPARISON:  CT head 10/06/2022 FINDINGS: Brain: Negative for acute infarct. Generalized mild atrophy. Chronic microvascular ischemic changes in the white matter. Pituitary overall normal in size. 5 mm T1 hyperintensity in the posterior pituitary best seen on sagittal T1 image 13. No suprasellar extension. Vascular: Normal arterial flow voids Skull and upper cervical spine: No focal skeletal lesion. Sinuses/Orbits: Mucosal edema paranasal sinuses. Air-fluid level right maxillary sinus. Bilateral cataract extraction Other: None IMPRESSION: 1. Negative for acute infarct. 2. Atrophy and chronic microvascular ischemic change in the white matter. 3. 5 mm T1 hyperintensity in the posterior pituitary. Possible Rathke's cleft cyst or pituitary adenoma. Slight enlargement since the prior MRI of 04/28/2022 Electronically Signed   By: Franchot Gallo M.D.   On: 10/06/2022 18:57   DG Shoulder Right  Result Date:  10/06/2022 CLINICAL DATA:  Right shoulder pain EXAM: RIGHT SHOULDER - 2+ VIEW COMPARISON:  10/01/2014 FINDINGS: Healed fracture proximal right humerus. No acute fracture. Normal alignment. Multiple right rib fractures of indeterminate age. Recommend rib series of symptomatic. IMPRESSION: 1. No acute fracture of the shoulder. 2. Multiple right rib fractures of indeterminate age. Electronically Signed   By: Franchot Gallo M.D.   On: 10/06/2022 17:44   CT Head Wo Contrast  Result Date: 10/06/2022 CLINICAL DATA:  Acute stroke suspected EXAM: CT HEAD WITHOUT CONTRAST TECHNIQUE: Contiguous axial images were obtained from the base of the skull through the vertex without intravenous contrast. RADIATION DOSE REDUCTION: This exam was performed according to the departmental dose-optimization program which includes automated exposure control, adjustment of the mA and/or kV according to patient size and/or use of iterative reconstruction technique. COMPARISON:  CT Head 08/09/22 FINDINGS: Brain: No evidence of acute infarction, hemorrhage, hydrocephalus, extra-axial collection or mass lesion/mass effect. Vascular: No hyperdense vessel or unexpected calcification. Skull: Normal. Negative for fracture or focal lesion. Soft tissue swelling over the left parietal scalp Sinuses/Orbits: Bilateral lens replacement.Frothy secretions right maxillary sinus. Other: None. IMPRESSION: 1. No acute intracranial abnormality. 2. Soft tissue swelling over the left parietal scalp. No underlying fracture. 3. Frothy secretions right maxillary sinus. Correlate for acute sinusitis. Electronically Signed   By: Marin Roberts M.D.   On: 10/06/2022 15:51   DG Chest Port 1 View  Result Date: 10/06/2022 CLINICAL DATA:  Question sepsis.  Fell today. EXAM: PORTABLE CHEST 1 VIEW COMPARISON:  08/09/2022 FINDINGS: Artifact overlies the chest. Density related to bilateral breast implants. The right chest appears clear allowing for that. Abnormal density  in the left lower chest could be due to left lower lobe infiltrate/collapse/effusion. Old healed left humeral fracture. IMPRESSION: Abnormal density in the left lower chest that could be due to left lower lobe infiltrate/collapse/effusion. Electronically Signed   By: Nelson Chimes M.D.   On: 10/06/2022 14:03            LOS: 8 days  Emeterio Reeve, DO [facility] 10/16/2022, 1:48 PM   Staff may message me via secure chat in [place]  but this may not receive immediate response,  please page for urgent matters!  If 7PM-7AM, please contact night-coverage [email or web address]  Dictation software was used to generate the above note. Typos may occur and escape review, as with typed/written notes. Please contact Dr Sheppard Coil directly for clarity if needed.

## 2022-10-17 DIAGNOSIS — I480 Paroxysmal atrial fibrillation: Secondary | ICD-10-CM | POA: Diagnosis not present

## 2022-10-17 DIAGNOSIS — I890 Lymphedema, not elsewhere classified: Secondary | ICD-10-CM | POA: Diagnosis not present

## 2022-10-17 DIAGNOSIS — W1830XA Fall on same level, unspecified, initial encounter: Secondary | ICD-10-CM | POA: Diagnosis not present

## 2022-10-17 DIAGNOSIS — R404 Transient alteration of awareness: Secondary | ICD-10-CM | POA: Diagnosis not present

## 2022-10-17 LAB — BASIC METABOLIC PANEL
Anion gap: 9 (ref 5–15)
BUN: 40 mg/dL — ABNORMAL HIGH (ref 8–23)
CO2: 26 mmol/L (ref 22–32)
Calcium: 8.6 mg/dL — ABNORMAL LOW (ref 8.9–10.3)
Chloride: 101 mmol/L (ref 98–111)
Creatinine, Ser: 1.35 mg/dL — ABNORMAL HIGH (ref 0.44–1.00)
GFR, Estimated: 41 mL/min — ABNORMAL LOW (ref >60)
Glucose, Bld: 107 mg/dL — ABNORMAL HIGH (ref 70–99)
Potassium: 3.6 mmol/L (ref 3.5–5.1)
Sodium: 136 mmol/L (ref 135–145)

## 2022-10-17 LAB — CBC
HCT: 37.8 % (ref 36.0–46.0)
Hemoglobin: 12.3 g/dL (ref 12.0–15.0)
MCH: 30.4 pg (ref 26.0–34.0)
MCHC: 32.5 g/dL (ref 30.0–36.0)
MCV: 93.3 fL (ref 80.0–100.0)
Platelets: 256 10*3/uL (ref 150–400)
RBC: 4.05 MIL/uL (ref 3.87–5.11)
RDW: 13.9 % (ref 11.5–15.5)
WBC: 7.5 10*3/uL (ref 4.0–10.5)
nRBC: 0 % (ref 0.0–0.2)

## 2022-10-17 MED ORDER — GUAIFENESIN ER 600 MG PO TB12
600.0000 mg | ORAL_TABLET | Freq: Two times a day (BID) | ORAL | Status: DC | PRN
  Filled 2022-10-17: qty 1

## 2022-10-17 MED ORDER — METOPROLOL TARTRATE 50 MG PO TABS
50.0000 mg | ORAL_TABLET | Freq: Two times a day (BID) | ORAL | Status: DC
  Administered 2022-10-17 – 2022-10-18 (×2): 50 mg via ORAL
  Filled 2022-10-17 (×2): qty 1

## 2022-10-17 NOTE — Progress Notes (Signed)
PROGRESS NOTE    RIPLEY LOVECCHIO   UVO:[id number] DOB: Dec 18, 1945  DOA: 10/06/2022 Date of Service: 10/17/22 PCP: Jodi Marble, MD     Brief Narrative / Hospital Course:  LAKRISHA ISEMAN is a 76 y.o. female with a PMH significant for hypertension, CVA, depression with anxiety, migraine headache, right breast cancer s/p mastectomy, chronic pain syndrome, HFpEF, CKD stage IIIa, COPD, SLE. Presented from home to the ED on 10/06/2022 with period of unwitnessed unconsciousness less than a day which she does not recall. She was found on the floor by a friend and was speaking incoherently.  In the ED, BP 169/76 with heart rate of 70.  She was hypothermic at 95.3. Initial work-up remarkable for normal CBC, lactic acid, troponin, and CK.  CMP notable for potassium of 3.1, AST of 15, alkaline phosphatase of 638, total bili of 1.3.  CT head was obtained that did not show any acute abnormality other than soft tissue swelling over the left parietal scalp. Brain MRI negative for acute infarct. Initially treated with Abx and IV fluids as well as electrolyte replacement. Patient was admitted to medicine service for further workup and management of AMS  Given history of previous similar episodes, normal imaging in ED, EEG was repeated and was also negative.  Allergy evaluated, concluded overall confusion but is delirium on dementia and have signed off.  Holding/decreasing sedating medications as able. 10/12/22 - patient is agreeable to SNF. Insurance has denied. Family plans to appeal and attending physician Dr Ouida Sills agreed with that - stating patient was not appropriate to go home at this time.  11/01 - remains significantly weak, still pending nephew calling insurance for appeal, see TOC note from today, the time sensitivity was stressed to him.  11/02: New Afib RVR - to progressive, trial diltiazem IV x1 --> did not convert, will need cardizem gtt and heparin gtt. Will repeat Echo. HR not responding,  consulted cardiology Dr Clayborn Bigness advised metoprolol '5mg'$  IV Q15 minutes x 3 if heart rate > 120 and Bp >100. Metoprolol tartrate 25 mg po Q8 hr starting now. Consider amiodarone if still not responding.   11/03: remains on cardizem gtt and heparin gtt and HR remains elevated this morning, but improved and off cardizem gtt in the afternoon. Cardiology saw patient in AM - contineu rate control, consider anticoag and rate control for Afib on discharge, modest diuresis would be appropriate, conservative cardiac management at this time.  11/04: this morning still on cardizem and heparin. Per RN gets significantly tachy any time she ambulates - await cardiology recs re: d/c drip. Has been on heparin approx 48 h can likely transition to eliquis today  11/05: off cardizem and in sinus rhythm, trial metoprolol 50 mg bid instead of 25 q8h  Consultants:  Neurology   Procedures: EEG      ASSESSMENT & PLAN:   Principal Problem:   Acute encephalopathy Active Problems:   Ground-level fall   Lymphedema   Elevated LFTs   Altered mental status   Bilateral leg edema   Community acquired pneumonia of left lower lobe of lung   Head injury   Delirium due to another medical condition   Paroxysmal atrial fibrillation with RVR (HCC)  Paroxysmal atrial fibrillation with RVR (HCC) Diltiazem gtt --> off 10/17/22 and now in sinus  heparin gtt --> eliquis  Echo --> EF 55-60%, no RWMA, unable to eval diastolic fxn  Check troponin, TSH, BMP, Mg, CBC --> no concerns  Metoprolol tartrate 25  mg po Q8 hr --> trial on 50 mg bid   Acute encephalopathy resolved - earlier in admission w/ fluctuating delirium. She has periods of disorientation and lack of capacity.  Mild to Moderate dementia H/o falls and balance issues H/o pelvic and radial fractures from previous fall Repeat EEG negative.  Neurology evaluated and concluded is delirium on dementia. Neuro has signed off. No further recommendations.  Frequent  neurochecks, re-orientation B12  continue to hold/decrease sedating medications as able Holding home Flexeril and Ambien PT/OT recommending SNF at dc, TOC engaged. delirium precautions    Elevated LFTs - likely fatty liver  Per chart review, patient has a history of elevated alkaline phosphatase dating back to 2014 with new elevation in AST dating back to August 13, 2022 that is improving.  No known alcohol use.  GGT --> elevated.  RUQ [place] --> significant for diffuse liver increased echogenicity could be sign of fatty liver vs viral infection.  Hepatitis panel --> negative.   HTN  HLD well controlled on home medications   Hx HFrEF euvolemic on exam   Body mass index is 23.48 kg/m.     DVT prophylaxis: Eliquis Pertinent IV fluids/nutrition: no continuous IV fluids  Central lines / invasive devices: none  Code Status: DNR Family Communication:   Disposition: remains inpatient TOC needs: waiting on nephew to appeal for SNF Barriers to discharge / significant pending items: waiting on nephew to appeal for SNF, if unable to arrange this will need to d/c [facility] w/ [facility] however new Afib will need to be consistently rate controlled off drip before can discharge              Subjective:  Patient reports palpitations are resolved but she has some anxiety - no change from yesterday, no chest pain or difficulty breathing.        Objective:  Vitals:   10/17/22 0504 10/17/22 0600 10/17/22 0814 10/17/22 1100  BP: 125/70 (!) 105/48 105/69 109/71  Pulse: 87 84 87 73  Resp: (!) '23 13 16 15  '$ Temp: 98.1 F (36.7 C)  98.2 F (36.8 C) 98.3 F (36.8 C)  TempSrc: Oral  Oral Oral  SpO2: 96% 97% 92% 93%  Weight: 55.6 kg     Height:        Intake/Output Summary (Last 24 hours) at 10/17/2022 1429 Last data filed at 10/17/2022 1103 Gross per 24 hour  Intake 360 ml  Output 600 ml  Net -240 ml    Filed Weights   10/06/22 1312 10/17/22 0504  Weight: 64 kg 55.6 kg     Examination: Constitutional:  VS as above General Appearance: alert, well-developed, well-nourished, NAD Respiratory: Normal respiratory effort No wheeze No rhonchi No rales Cardiovascular: RRR No lower extremity edema Gastrointestinal: No tenderness Musculoskeletal:  No clubbing/cyanosis of digits Symmetrical movement in all extremities Neurological: No cranial nerve deficit on limited exam Alert Psychiatric: Normal judgment/insight Normal mood and affect       Scheduled Medications:   apixaban  5 mg Oral BID   vitamin B-12  1,000 mcg Oral Daily   folic acid  1 mg Oral Daily   hydrochlorothiazide  12.5 mg Oral Daily   metoprolol tartrate  50 mg Oral BID   multivitamin with minerals  1 tablet Oral Daily   rosuvastatin  10 mg Oral Daily   sodium chloride flush  3 mL Intravenous Q12H    Continuous Infusions:  diltiazem (CARDIZEM) infusion 5 mg/hr (10/16/22 1530)    PRN Medications:  acetaminophen **OR** acetaminophen, ALPRAZolam, HYDROcodone-acetaminophen, metoprolol tartrate, ondansetron **OR** ondansetron (ZOFRAN) IV  Antimicrobials:  Anti-infectives (From admission, onward)    Start     Dose/Rate Route Frequency Ordered Stop   10/06/22 1600  cefTRIAXone (ROCEPHIN) 1 g in sodium chloride 0.9 % 100 mL IVPB        1 g 200 mL/hr over 30 Minutes Intravenous  Once 10/06/22 1556 10/06/22 1637   10/06/22 1600  azithromycin (ZITHROMAX) 500 mg in sodium chloride 0.9 % 250 mL IVPB        500 mg 250 mL/hr over 60 Minutes Intravenous  Once 10/06/22 1556 10/06/22 1807       Data Reviewed: I have personally reviewed following labs and imaging studies  CBC: Recent Labs  Lab 10/12/22 1003 10/14/22 1421 10/16/22 0224 10/17/22 0359  WBC 8.0 7.2 7.2 7.5  HGB 12.2 12.5 12.2 12.3  HCT 36.9 37.8 36.6 37.8  MCV 91.8 92.4 92.9 93.3  PLT 207 239 244 812   Basic Metabolic Panel: Recent Labs  Lab 10/12/22 1003 10/13/22 0613 10/14/22 1421 10/14/22 1629  10/15/22 0426 10/16/22 0224 10/17/22 0359  NA 135 137  --  137 135 135 136  K 3.0* 3.3*  --  3.9 3.6 3.6 3.6  CL 97* 98  --  98 98 99 101  CO2 27 30  --  '26 25 28 26  '$ GLUCOSE 87 91  --  99 90 116* 107*  BUN 20 31*  --  31* 33* 36* 40*  CREATININE 1.32* 1.57*  --  1.34* 1.20* 1.30* 1.35*  CALCIUM 8.5* 8.6*  --  9.0 8.6* 8.5* 8.6*  MG 2.3  --  2.1  --   --   --   --    GFR: Estimated Creatinine Clearance: 31.1 mL/min (A) (by C-G formula based on SCr of 1.35 mg/dL (H)). Liver Function Tests: No results for input(s): "AST", "ALT", "ALKPHOS", "BILITOT", "PROT", "ALBUMIN" in the last 168 hours.  No results for input(s): "LIPASE", "AMYLASE" in the last 168 hours. No results for input(s): "AMMONIA" in the last 168 hours.  Coagulation Profile: No results for input(s): "INR", "PROTIME" in the last 168 hours.  Cardiac Enzymes: No results for input(s): "CKTOTAL", "CKMB", "CKMBINDEX", "TROPONINI" in the last 168 hours. BNP (last 3 results) No results for input(s): "PROBNP" in the last 8760 hours. HbA1C: No results for input(s): "HGBA1C" in the last 72 hours. CBG: Recent Labs  Lab 10/10/22 1641  GLUCAP 87   Lipid Profile: No results for input(s): "CHOL", "HDL", "LDLCALC", "TRIG", "CHOLHDL", "LDLDIRECT" in the last 72 hours. Thyroid Function Tests: No results for input(s): "TSH", "T4TOTAL", "FREET4", "T3FREE", "THYROIDAB" in the last 72 hours.  Anemia Panel: No results for input(s): "VITAMINB12", "FOLATE", "FERRITIN", "TIBC", "IRON", "RETICCTPCT" in the last 72 hours. Urine analysis:    Component Value Date/Time   COLORURINE YELLOW (A) 10/06/2022 1328   APPEARANCEUR CLEAR (A) 10/06/2022 1328   LABSPEC 1.013 10/06/2022 1328   PHURINE 7.0 10/06/2022 1328   GLUCOSEU NEGATIVE 10/06/2022 1328   HGBUR NEGATIVE 10/06/2022 [place] 10/06/2022 1328   BILIRUBINUR neg 11/10/2015 1352   KETONESUR NEGATIVE 10/06/2022 1328   PROTEINUR 30 (A) 10/06/2022 1328    UROBILINOGEN negative 11/10/2015 1352   UROBILINOGEN 0.2 11/20/2013 0201   NITRITE NEGATIVE 10/06/2022 1328   LEUKOCYTESUR NEGATIVE 10/06/2022 1328   Sepsis Labs: '@LABRCNTIP'$ (procalcitonin:4,lacticidven:4)  Recent Results (from the past 240 hour(s))  CSF culture w Gram Stain     Status: None  Collection Time: 10/08/22  3:30 PM   Specimen: CSF; Cerebrospinal Fluid  Result Value Ref Range Status   Specimen Description   Final    CSF Performed at [facility], [address], [place], [place] [address]    Special Requests   Final    NONE Performed at [facility], [address] [place], [place] [address]    Gram Stain   Final    NO ORGANISMS SEEN WBC SEEN RED BLOOD CELLS SEEN Performed at [facility], [address], [place], [place] [address]    Culture   Final    NO GROWTH 3 DAYS Performed at [facility], [place] [address], [place], [place] [address]    Report Status 10/11/2022 FINAL  Final         Radiology Studies: CT HEAD WO CONTRAST (5MM)  Result Date: 10/07/2022 CLINICAL DATA:  Altered mental status EXAM: CT HEAD WITHOUT CONTRAST TECHNIQUE: Contiguous axial images were obtained from the base of the skull through the vertex without intravenous contrast. RADIATION DOSE REDUCTION: This exam was performed according to the departmental dose-optimization program which includes automated exposure control, adjustment of the mA and/or kV according to patient size and/or use of iterative reconstruction technique. COMPARISON:  10/06/2022 FINDINGS: Brain: No acute intracranial findings are seen. There are no signs of bleeding within the cranium. Ventricles are not dilated. Cortical sulci are prominent. There is decreased density in periventricular white matter. Vascular: Unremarkable. Skull: Unremarkable. Sinuses/Orbits: There is mucosal thickening in right maxillary sinus. There is mucosal thickening and frothy density in right side of sphenoid  sinus. Other: None. IMPRESSION: No acute intracranial findings are seen in noncontrast CT brain. Atrophy. Small-vessel disease. Electronically Signed   By: Elmer Picker M.D.   On: 10/07/2022 15:51   EEG adult  Result Date: 10/07/2022 Lora Havens, MD     10/07/2022 12:28 PM Patient Name: WALKER PADDACK MRN: [id number] Epilepsy Attending: Lora Havens Referring Physician/Provider: Jose Persia, MD Date: 10/07/2022 Duration: 31.51 mins Patient history: 76 year old female with altered mental status.  EEG to eval for seizure. Level of alertness: Awake AEDs during EEG study: None Technical aspects: This EEG study was done with scalp electrodes positioned according to the 10-20 International system of electrode placement. Electrical activity was reviewed with band pass filter of 1-'70Hz'$ , sensitivity of 7 uV/mm, display speed of 59m/sec with a '60Hz'$  notched filter applied as appropriate. EEG data were recorded continuously and digitally stored.  Video monitoring was available and reviewed as appropriate. Description: The posterior dominant rhythm consists of 8-9 Hz activity of moderate voltage (25-35 uV) seen predominantly in posterior head regions, symmetric and reactive to eye opening and eye closing. Physiologic photic driving was seen during photic stimulation. Hyperventilation was not performed.   IMPRESSION: This study is within normal limits. No seizures or epileptiform discharges were seen throughout the recording. A normal interictal EEG does not exclude the diagnosis of epilepsy. Priyanka OBarbra Sarks  [place]domen Limited RUQ (LIVER/GB)  Result Date: 10/07/2022 CLINICAL DATA:  Transaminitis EXAM: ULTRASOUND ABDOMEN LIMITED RIGHT UPPER QUADRANT COMPARISON:  CT abdomen pelvis 09/24/2009 FINDINGS: Gallbladder: Surgically absent Common bile duct: Diameter: 3 mm Liver: Parenchymal echogenicity: Mild diffuse increase echogenicity. Contours: Normal Lesions: None Portal vein: Patent.  Hepatopetal  flow Other: None. IMPRESSION: Mild diffuse increased echogenicity of the hepatic parenchyma is a nonspecific indicator of hepatocellular dysfunction, most commonly steatosis. Electronically Signed   By: FMiachel RouxM.D.   On: 10/07/2022  09:36   US Venous Img Lower Bilateral (DVT)  Result Date: 10/07/2022 CLINICAL DATA:  Syncope EXAM: Bilateral lower Extremity Venous Doppler Ultrasound TECHNIQUE: Gray-scale sonography with compression, as well as color and duplex ultrasound, were performed to evaluate the deep venous system(s) from the level of the common femoral vein through the popliteal and proximal calf veins. COMPARISON:  04/29/2022 FINDINGS: VENOUS Normal compressibility of the common femoral, superficial femoral, and popliteal veins, as well as the visualized calf veins. Visualized portions of profunda femoral vein and great saphenous vein unremarkable. No filling defects to suggest DVT on grayscale or color Doppler imaging. Doppler waveforms show normal direction of venous flow, normal respiratory plasticity and response to augmentation. OTHER None. Limitations: none IMPRESSION: No lower extremity DVT. Electronically Signed   By: Miachel Roux M.D.   On: 10/07/2022 09:34   DG Ribs Unilateral Right  Result Date: 10/06/2022 CLINICAL DATA:  [place], right rib fractures EXAM: RIGHT RIBS - 2 VIEW COMPARISON:  Shoulder x-ray 10/06/2022, chest x-ray 10/06/2022 FINDINGS: Frontal and oblique views of the right thoracic cage are obtained. Right breast prosthesis obscures portions of the underlying ribs. Right chest is clear without airspace disease, effusion, or pneumothorax. There are chronic healed right posterior eighth and ninth rib fractures. 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 There are no acute displaced fractures. IMPRESSION: 1. Multiple chronic right rib fractures as above. No acute bony abnormality. Electronically Signed   By: Randa Ngo M.D.   On: 10/06/2022  19:47   MR BRAIN WO CONTRAST  Result Date: 10/06/2022 CLINICAL DATA:  Mental status change.  Fall today. EXAM: MRI HEAD WITHOUT CONTRAST TECHNIQUE: Multiplanar, multiecho pulse sequences of the brain and surrounding structures were obtained without intravenous contrast. COMPARISON:  CT head 10/06/2022 FINDINGS: Brain: Negative for acute infarct. Generalized mild atrophy. Chronic microvascular ischemic changes in the white matter. Pituitary overall normal in size. 5 mm T1 hyperintensity in the posterior pituitary best seen on sagittal T1 image 13. No suprasellar extension. Vascular: Normal arterial flow voids Skull and upper cervical spine: No focal skeletal lesion. Sinuses/Orbits: Mucosal edema paranasal sinuses. Air-fluid level right maxillary sinus. Bilateral cataract extraction Other: None IMPRESSION: 1. Negative for acute infarct. 2. Atrophy and chronic microvascular ischemic change in the white matter. 3. 5 mm T1 hyperintensity in the posterior pituitary. Possible Rathke's cleft cyst or pituitary adenoma. Slight enlargement since the prior MRI of 04/28/2022 Electronically Signed   By: Franchot Gallo M.D.   On: 10/06/2022 18:57   DG Shoulder Right  Result Date: 10/06/2022 CLINICAL DATA:  Right shoulder pain EXAM: RIGHT SHOULDER - 2+ VIEW COMPARISON:  10/01/2014 FINDINGS: Healed fracture proximal right humerus. No acute fracture. Normal alignment. Multiple right rib fractures of indeterminate age. Recommend rib series of symptomatic. IMPRESSION: 1. No acute fracture of the shoulder. 2. Multiple right rib fractures of indeterminate age. Electronically Signed   By: Franchot Gallo M.D.   On: 10/06/2022 17:44   CT Head Wo Contrast  Result Date: 10/06/2022 CLINICAL DATA:  Acute stroke suspected EXAM: CT HEAD WITHOUT CONTRAST TECHNIQUE: Contiguous axial images were obtained from the base of the skull through the vertex without intravenous contrast. RADIATION DOSE REDUCTION: This exam was performed  according to the departmental dose-optimization program which includes automated exposure control, adjustment of the mA and/or kV according to patient size and/or use of iterative reconstruction technique. COMPARISON:  CT Head 08/09/22 FINDINGS: Brain: No evidence of acute infarction, hemorrhage, hydrocephalus, extra-axial collection or mass  lesion/mass effect. Vascular: No hyperdense vessel or unexpected calcification. Skull: Normal. Negative for fracture or focal lesion. Soft tissue swelling over the left parietal scalp Sinuses/Orbits: Bilateral lens replacement.Frothy secretions right maxillary sinus. Other: None. IMPRESSION: 1. No acute intracranial abnormality. 2. Soft tissue swelling over the left parietal scalp. No underlying fracture. 3. Frothy secretions right maxillary sinus. Correlate for acute sinusitis. Electronically Signed   By: Marin Roberts M.D.   On: 10/06/2022 15:51   DG Chest Port 1 View  Result Date: 10/06/2022 CLINICAL DATA:  Question sepsis.  Fell today. EXAM: PORTABLE CHEST 1 VIEW COMPARISON:  08/09/2022 FINDINGS: Artifact overlies the chest. Density related to bilateral breast implants. The right chest appears clear allowing for that. Abnormal density in the left lower chest could be due to left lower lobe infiltrate/collapse/effusion. Old healed left humeral fracture. IMPRESSION: Abnormal density in the left lower chest that could be due to left lower lobe infiltrate/collapse/effusion. Electronically Signed   By: Nelson Chimes M.D.   On: 10/06/2022 14:03            LOS: 9 days       Emeterio Reeve, DO [facility] 10/17/2022, 2:29 PM   Staff may message me via secure chat in [place]  but this may not receive immediate response,  please page for urgent matters!  If 7PM-7AM, please contact night-coverage [email or web address]  Dictation software was used to generate the above note. Typos may occur and escape review, as with typed/written notes. Please contact Dr  Sheppard Coil directly for clarity if needed.

## 2022-10-17 NOTE — Progress Notes (Signed)
[facility] Cardiology    SUBJECTIVE: Resting comfortably denies any shortness of breath no pain.  Denies any fever chills or sweats rate rhythm controlled on Eliquis now here for routine follow-up feels much improved feels well enough to be discharged   Vitals:   10/17/22 0504 10/17/22 0600 10/17/22 0814 10/17/22 1100  BP: 125/70 (!) 105/48 105/69 109/71  Pulse: 87 84 87 73  Resp: (!) '23 13 16 15  '$ Temp: 98.1 F (36.7 C)  98.2 F (36.8 C) 98.3 F (36.8 C)  TempSrc: Oral  Oral Oral  SpO2: 96% 97% 92% 93%  Weight: 55.6 kg     Height:         Intake/Output Summary (Last 24 hours) at 10/17/2022 1325 Last data filed at 10/17/2022 1103 Gross per 24 hour  Intake 360 ml  Output 600 ml  Net -240 ml      PHYSICAL EXAM  General: Well developed, well nourished, in no acute distress HEENT:  Normocephalic and atramatic Neck:  No JVD.  Lungs: Clear bilaterally to auscultation and percussion. Heart: HRRR . Normal S1 and S2 without gallops or murmurs.  Abdomen: Bowel sounds are positive, abdomen soft and non-tender  Msk:  Back normal, normal gait. Normal strength and tone for age. Extremities: No clubbing, cyanosis or edema.   Neuro: Alert and oriented X 3. Psych:  Good affect, responds appropriately   LABS: Basic Metabolic Panel: Recent Labs    10/16/22 0224 10/17/22 0359  NA 135 136  K 3.6 3.6  CL 99 101  CO2 28 26  GLUCOSE 116* 107*  BUN 36* 40*  CREATININE 1.30* 1.35*  CALCIUM 8.5* 8.6*   Liver Function Tests: No results for input(s): "AST", "ALT", "ALKPHOS", "BILITOT", "PROT", "ALBUMIN" in the last 72 hours. No results for input(s): "LIPASE", "AMYLASE" in the last 72 hours. CBC: Recent Labs    10/16/22 0224 10/17/22 0359  WBC 7.2 7.5  HGB 12.2 12.3  HCT 36.6 37.8  MCV 92.9 93.3  PLT 244 256   Cardiac Enzymes: No results for input(s): "CKTOTAL", "CKMB", "CKMBINDEX", "TROPONINI" in the last 72 hours. BNP: Invalid input(s): "POCBNP" D-Dimer: No results for  input(s): "DDIMER" in the last 72 hours. Hemoglobin A1C: No results for input(s): "HGBA1C" in the last 72 hours. Fasting Lipid Panel: No results for input(s): "CHOL", "HDL", "LDLCALC", "TRIG", "CHOLHDL", "LDLDIRECT" in the last 72 hours. Thyroid Function Tests: No results for input(s): "TSH", "T4TOTAL", "T3FREE", "THYROIDAB" in the last 72 hours.  Invalid input(s): "FREET3" Anemia Panel: No results for input(s): "VITAMINB12", "FOLATE", "FERRITIN", "TIBC", "IRON", "RETICCTPCT" in the last 72 hours.  No results found.   Echo preserved left ventricular function EF of 55%    ASSESSMENT AND PLAN:  Principal Problem:   Acute encephalopathy Active Problems:   Ground-level fall   Lymphedema   Elevated LFTs   Altered mental status   Bilateral leg edema   Community acquired pneumonia of left lower lobe of lung   Head injury   Delirium due to another medical condition   Paroxysmal atrial fibrillation with RVR (HCC)    Plan Paroxysmal atrial fibrillation diltiazem Eliquis continue conservative medical therapy Acute encephalopathy resolved with fluctuating delirium and disorientation continue current therapy Elevated LFTs likely from fatty liver continue conservative management Hypertension reasonably controlled continue current blood pressure medications Chronic systolic congestive heart failure euvolemic continue current therapy Community-acquired lower lobe    Yolonda Kida, MD 10/17/2022 1:25 PM

## 2022-10-18 ENCOUNTER — Other Ambulatory Visit: Payer: Self-pay

## 2022-10-18 DIAGNOSIS — W1830XA Fall on same level, unspecified, initial encounter: Secondary | ICD-10-CM | POA: Diagnosis not present

## 2022-10-18 DIAGNOSIS — R404 Transient alteration of awareness: Secondary | ICD-10-CM | POA: Diagnosis not present

## 2022-10-18 DIAGNOSIS — S0990XA Unspecified injury of head, initial encounter: Secondary | ICD-10-CM | POA: Diagnosis not present

## 2022-10-18 DIAGNOSIS — G9340 Encephalopathy, unspecified: Secondary | ICD-10-CM | POA: Diagnosis not present

## 2022-10-18 MED ORDER — FOLIC ACID 1 MG PO TABS: 1.0000 mg | ORAL_TABLET | Freq: Every day | ORAL | 0 refills | Status: DC

## 2022-10-18 MED ORDER — APIXABAN 5 MG PO TABS: 5.0000 mg | ORAL_TABLET | Freq: Two times a day (BID) | ORAL | 0 refills | Status: DC

## 2022-10-18 MED ORDER — APIXABAN 5 MG PO TABS
5.0000 mg | ORAL_TABLET | Freq: Two times a day (BID) | ORAL | 0 refills | Status: DC
  Filled 2022-10-18: qty 60, 30d supply, fill #0

## 2022-10-18 MED ORDER — METOPROLOL TARTRATE 50 MG PO TABS: 50.0000 mg | ORAL_TABLET | Freq: Two times a day (BID) | ORAL | 0 refills | Status: DC

## 2022-10-18 MED ORDER — ALPRAZOLAM 1 MG PO TABS: 2.0000 mg | ORAL_TABLET | Freq: Two times a day (BID) | ORAL | Status: DC | PRN

## 2022-10-18 MED ORDER — ZOLPIDEM TARTRATE 10 MG PO TABS: 5.0000 mg | ORAL_TABLET | Freq: Every day | ORAL | 0 refills | Status: DC

## 2022-10-18 MED ORDER — FOLIC ACID 1 MG PO TABS: 1.0000 mg | ORAL_TABLET | Freq: Every day | ORAL | Status: DC

## 2022-10-18 NOTE — TOC Transition Note (Addendum)
Transition of Care [facility]) - CM/SW Discharge Note   Patient Details  Name: Donna Evans MRN: [id number] Date of Birth: 22-Sep-1946  Transition of Care [facility]) CM/SW Contact:  Candie Chroman, LCSW Phone Number: 10/18/2022, 11:23 AM   Clinical Narrative:  Patient has orders to discharge home today. [place] liaison is aware. Ordered RW and 3-in-1 through [employer]. Nephew will call family to arrange transport home. No further concerns. CSW signing off.   1:44 pm: Nephew will be here in about 45 minutes. He asked about personal care services and is agreeable to [facility] referral. Gave information in liaison, Andee Poles.  Final next level of care: [place] Barriers to Discharge: Barriers Resolved   Patient Goals and CMS Choice Patient states their goals for this hospitalization and ongoing recovery are:: home with home health, patient refuses SNF CMS Medicare.gov Compare Post Acute Care list provided to:: Patient Choice offered to / list presented to : [place] / [place]  Discharge Placement                  Name of family member notified: Tomma Rakers Patient and family notified of of transfer: 10/18/22  Discharge Plan and Services                DME Arranged: Gilford Rile rolling DME Agency: [employer] Date DME Agency Contacted: 10/18/22   Representative spoke with at DME Agency: Suanne Marker HH Arranged: RN, PT, OT [facility] Agency: [place] Date [place]: 10/18/22   Representative spoke with at [place]: Theadora Rama  Social Determinants of Health ([place]) Interventions     Readmission Risk Interventions     No data to display

## 2022-10-18 NOTE — Plan of Care (Signed)

## 2022-10-18 NOTE — Care Management Important Message (Signed)
Important Message  Patient Details  Name: Donna Evans MRN: [id number] Date of Birth: 1946/01/07   Medicare Important Message Given:  Yes     Dannette Barbara 10/18/2022, 12:59 PM

## 2022-10-18 NOTE — Discharge Summary (Signed)
Physician Discharge Summary   Patient: Donna Evans MRN: [id number]  DOB: 1946/02/07   Admit:     Date of Admission: 10/06/2022 Admitted from: home   Discharge: Date of discharge: 10/18/22 Disposition: Home health Condition at discharge: good  CODE STATUS: DNR     Discharge Physician: Emeterio Reeve, DO [facility]     PCP: Jodi Marble, MD  Recommendations for Outpatient Follow-up:  Follow up with PCP Jodi Marble, MD in 1-2 weeks Please obtain labs/tests: CBC, CMP, Mg in 1-2 weeks Please follow up on the following pending results: none Please ensure outpatient follow up with cardiology  PCP AND OTHER OUTPATIENT PROVIDERS: SEE BELOW FOR SPECIFIC DISCHARGE INSTRUCTIONS PRINTED FOR PATIENT IN ADDITION TO GENERIC AVS PATIENT INFO     Discharge Instructions     Diet - low sodium heart healthy   Complete by: As directed    Face-to-face encounter (required for Medicare/Medicaid patients)   Complete by: As directed    I Emeterio Reeve certify that this patient is under my care and that I, or a nurse practitioner or physician's assistant working with me, had a face-to-face encounter that meets the physician face-to-face encounter requirements with this patient on 10/18/2022. The encounter with the patient was in whole, or in part for the following medical condition(s) which is the primary reason for home health care (List medical condition): a fib and debility   The encounter with the patient was in whole, or in part, for the following medical condition, which is the primary reason for home health care: a fib and debility   I certify that, based on my findings, the following services are medically necessary home health services:  Nursing Physical therapy     Reason for Medically Necessary Home Health Services:  Skilled Nursing- Skilled Assessment/Observation Therapy- Home [facility] to Facilitate Safety Therapy- Therapeutic Exercises to Increase  Strength and Endurance     My clinical findings support the need for the above services: Unable to leave home safely without assistance and/or assistive device   Further, I certify that my clinical findings support that this patient is homebound due to: Unable to leave home safely without assistance   Home Health   Complete by: As directed    To provide the following care/treatments:  PT OT RN     Increase activity slowly   Complete by: As directed          Discharge Diagnoses: Principal Problem:   Acute encephalopathy Active Problems:   Ground-level fall   Lymphedema   Elevated LFTs   Altered mental status   Bilateral leg edema   Community acquired pneumonia of left lower lobe of lung   Head injury   Delirium due to another medical condition   Paroxysmal atrial fibrillation with RVR [facility])       Hospital Course: Donna Evans is a 76 y.o. female with a PMH significant for hypertension, CVA, depression with anxiety, migraine headache, right breast cancer s/p mastectomy, chronic pain syndrome, HFpEF, CKD stage IIIa, COPD, SLE. Presented from home to the ED on 10/06/2022 with period of unwitnessed unconsciousness less than a day which she does not recall. She was found on the floor by a friend and was speaking incoherently.  In the ED, BP 169/76 with heart rate of 70.  She was hypothermic at 95.3. Initial work-up remarkable for normal CBC, lactic acid, troponin, and CK.  CMP notable for potassium of 3.1, AST of 15, alkaline phosphatase  of 638, total bili of 1.3.  CT head was obtained that did not show any acute abnormality other than soft tissue swelling over the left parietal scalp. Brain MRI negative for acute infarct. Initially treated with Abx and IV fluids as well as electrolyte replacement. Patient was admitted to medicine service for further workup and management of AMS  Given history of previous similar episodes, normal imaging in ED, EEG was repeated and was also  negative.  Allergy evaluated, concluded overall confusion but is delirium on dementia and have signed off.  Holding/decreasing sedating medications as able. 10/12/22 - patient is agreeable to SNF. Insurance has denied. Family plans to appeal and attending physician Dr Ouida Sills agreed with that - stating patient was not appropriate to go home at this time.  11/01 - remains significantly weak, still pending nephew calling insurance for appeal, see TOC note from today, the time sensitivity was stressed to him.  11/02: New Afib RVR - to progressive, trial diltiazem IV x1 --> did not convert, will need cardizem gtt and heparin gtt. Will repeat Echo. HR not responding, consulted cardiology Dr Clayborn Bigness advised metoprolol '5mg'$  IV Q15 minutes x 3 if heart rate > 120 and Bp >100. Metoprolol tartrate 25 mg po Q8 hr starting now. Consider amiodarone if still not responding.   11/03: remains on cardizem gtt and heparin gtt and HR remains elevated this morning, but improved and off cardizem gtt in the afternoon. Cardiology saw patient in AM - contineu rate control, consider anticoag and rate control for Afib on discharge, modest diuresis would be appropriate, conservative cardiac management at this time.  11/04: this morning still on cardizem and heparin. Per RN gets significantly tachy any time she ambulates - await cardiology recs re: d/c drip. Has been on heparin approx 48 h can likely transition to eliquis today  11/05: off cardizem and in sinus rhythm, trial metoprolol 50 mg bid instead of 25 q8h 11/06: stable off drip, did well today w/ PT, okay for discharge home w/ home health.   Consultants:  Neurology  Cardiology   Procedures: EEG Echo      ASSESSMENT & PLAN:   Principal Problem:   Acute encephalopathy Active Problems:   Ground-level fall   Lymphedema   Elevated LFTs   Altered mental status   Bilateral leg edema   Community acquired pneumonia of left lower lobe of lung   Head injury    Delirium due to another medical condition   Paroxysmal atrial fibrillation with RVR (HCC)  Paroxysmal atrial fibrillation with RVR (HCC) Diltiazem gtt --> off 10/17/22 and now in sinus  heparin gtt --> eliquis  Echo --> EF 55-60%, no RWMA, unable to eval diastolic fxn  Check troponin, TSH, BMP, Mg, CBC --> no concerns  Metoprolol tartrate 25 mg po Q8 hr --> continue on 50 mg bid   Acute encephalopathy resolved - earlier in admission w/ fluctuating delirium. She has periods of disorientation and lack of capacity.  Mild to Moderate dementia H/o falls and balance issues H/o pelvic and radial fractures from previous fall Repeat EEG negative.  Neurology evaluated and concluded is delirium on dementia. Neuro has signed off. No further recommendations.  continue to hold/decrease sedating medications as able Holding home Flexeril and Ambien -> reduce dose on discharge, avoid use other than PRN     Elevated LFTs - likely fatty liver  Per chart review, patient has a history of elevated alkaline phosphatase dating back to 2014 with new elevation in AST dating  back to August 13, 2022 that is improving.  No known alcohol use.  GGT --> elevated.  RUQ [place] --> significant for diffuse liver increased echogenicity could be sign of fatty liver vs viral infection.  Hepatitis panel --> negative.   HTN  HLD well controlled on home medications   Hx HFrEF euvolemic on exam   Body mass index is 23.48 kg/m.         Discharge Instructions  Allergies as of 10/18/2022       Reactions   Ciprofloxacin Nausea And Vomiting, Hives   Codeine Anaphylaxis, Hives   Reaction:  Unknown    Demerol [meperidine] Anaphylaxis, Hives   Reaction:  Unknown    Fluconazole Hives   Latex Anaphylaxis, Rash   Morphine And Related Hives   Doxycycline Nausea And Vomiting   Flagyl [metronidazole] Nausea And Vomiting   Reaction:  Unknown    Influenza Vaccines Swelling   Localized swelling   Tape Other (See  Comments)   Pt allergic to Adhesive tape and latex.(  Skin breaks out)   Valtrex [valacyclovir Hcl] Hives   Buprenorphine    Other reaction(s): Other (see comments) unknown   Duloxetine Hcl Other (See Comments)   Reaction:  Unknown    Tramadol    Other reaction(s): Not available   Amoxicillin-pot Clavulanate Nausea And Vomiting   Sulfamethoxazole-trimethoprim Nausea And Vomiting   Valacyclovir Rash        Medication List     STOP taking these medications    aspirin EC 81 MG tablet   hydrochlorothiazide 12.5 MG capsule Commonly known as: MICROZIDE       TAKE these medications    ALPRAZolam 1 MG tablet Commonly known as: XANAX Take 2 tablets (2 mg total) by mouth 2 (two) times daily as needed for anxiety. What changed:  when to take this reasons to take this   alum & mag hydroxide-simeth 200-200-20 MG/5ML suspension Commonly known as: MAALOX/MYLANTA Take 15 mLs by mouth every 6 (six) hours as needed for indigestion or heartburn.   apixaban 5 MG Tabs tablet Commonly known as: ELIQUIS Take 1 tablet (5 mg total) by mouth 2 (two) times daily.   butorphanol 10 MG/ML nasal spray Commonly known as: STADOL Place 1 spray into the nose every 4 (four) hours as needed for headache.   calcium carbonate 500 MG chewable tablet Commonly known as: TUMS - dosed in mg elemental calcium Chew 1 tablet by mouth 3 (three) times daily as needed for indigestion or heartburn.   cyclobenzaprine 10 MG tablet Commonly known as: FLEXERIL Take 10 mg by mouth 3 (three) times daily.   folic acid 1 MG tablet Commonly known as: FOLVITE Take 1 tablet (1 mg total) by mouth daily.   metoprolol tartrate 50 MG tablet Commonly known as: LOPRESSOR Take 1 tablet (50 mg total) by mouth 2 (two) times daily.   rosuvastatin 10 MG tablet Commonly known as: CRESTOR Take 10 mg by mouth daily.   torsemide 20 MG tablet Commonly known as: DEMADEX Take 20 mg by mouth daily as needed.   VITAMIN  B-12 IJ Inject 1,000 mg as directed every 30 (thirty) days.   Vitamin D (Ergocalciferol) 50000 units Caps Take 1 capsule by mouth once a week.   zolpidem 10 MG tablet Commonly known as: AMBIEN Take 0.5 tablets (5 mg total) by mouth at bedtime. What changed: how much to take         Follow-up Information     Paraschos, Sheppard Coil, MD.  Go in 1 week(s).   Specialty: Cardiology Why: Appointment on Wednesday, 10/27/2022 at 2:45pm. Contact information: [facility] [place] [address] [phone]         Jodi Marble, MD. Go to.   Specialty: Internal Medicine Why: Appointment on Monday, 10/25/2022 at 1:45pm. Contact information: [place] [address] [phone]         [facility], [place] Follow up.   Specialty: Home Health Services Why: They will resume home health services at discharge. Contact information: [address] [place] [address] [phone]                 Allergies  Allergen Reactions   Ciprofloxacin Nausea And Vomiting and Hives   Codeine Anaphylaxis and Hives    Reaction:  Unknown    Demerol [Meperidine] Anaphylaxis and Hives    Reaction:  Unknown     Fluconazole Hives   Latex Anaphylaxis and Rash   Morphine And Related Hives   Doxycycline Nausea And Vomiting   Flagyl [Metronidazole] Nausea And Vomiting    Reaction:  Unknown    Influenza Vaccines Swelling    Localized swelling   Tape Other (See Comments)    Pt allergic to Adhesive tape and latex.(  Skin breaks out)   Valtrex [Valacyclovir Hcl] Hives   Buprenorphine     Other reaction(s): Other (see comments) unknown   Duloxetine Hcl Other (See Comments)    Reaction:  Unknown    Tramadol     Other reaction(s): Not available   Amoxicillin-Pot Clavulanate Nausea And Vomiting   Sulfamethoxazole-Trimethoprim Nausea And Vomiting   Valacyclovir Rash     Subjective: pt feeling well today, a bit  anxious but feels this is about her baseline, no CP/palpitations, no SOB, did well this morning w/ PT    Discharge Exam: BP (!) 114/46 (BP Location: Right Arm)   Pulse (!) 59   Temp (!) 97.5 F (36.4 C) (Oral)   Resp 10   Ht '5\' 5"'$  (1.651 m)   Wt 55.6 kg   SpO2 94%   BMI 20.40 kg/m  General: Pt is alert, awake, not in acute distress Cardiovascular: RRR, occasional early beats, S1/S2 +, no rubs, no gallops Respiratory: CTA bilaterally, no wheezing, no rhonchi Abdominal: Soft, NT, ND, bowel sounds + Extremities: no edema, no cyanosis     The results of significant diagnostics from this hospitalization (including imaging, microbiology, ancillary and laboratory) are listed below for reference.     Microbiology: Recent Results (from the past 240 hour(s))  CSF culture w Gram Stain     Status: None   Collection Time: 10/08/22  3:30 PM   Specimen: CSF; Cerebrospinal Fluid  Result Value Ref Range Status   Specimen Description   Final    CSF Performed at [facility], [address], [place], [place] [address]    Special Requests   Final    NONE Performed at [facility], [address] [place], [place] [address]    Gram Stain   Final    NO ORGANISMS SEEN WBC SEEN RED BLOOD CELLS SEEN Performed at [facility], [address], [place], [place] [address]    Culture   Final    NO GROWTH 3 DAYS Performed at [facility], [place] [address], [place], [place] [address]    Report Status 10/11/2022 FINAL  Final     Labs: BNP (last 3 results) Recent Labs  04/28/22 1241 10/06/22 1329  BNP 538.8* 403.4*   Basic Metabolic Panel: Recent Labs  Lab 10/12/22 1003 10/13/22 0613 10/14/22 1421 10/14/22 1629 10/15/22 0426 10/16/22 0224 10/17/22 0359  NA 135 137  --  137 135 135 136  K 3.0* 3.3*  --  3.9 3.6 3.6 3.6  CL 97* 98  --  98 98 99 101  CO2 27 30  --  '26 25 28 26  '$ GLUCOSE 87 91  --  99 90 116* 107*  BUN 20 31*  --  31* 33* 36*  40*  CREATININE 1.32* 1.57*  --  1.34* 1.20* 1.30* 1.35*  CALCIUM 8.5* 8.6*  --  9.0 8.6* 8.5* 8.6*  MG 2.3  --  2.1  --   --   --   --    Liver Function Tests: No results for input(s): "AST", "ALT", "ALKPHOS", "BILITOT", "PROT", "ALBUMIN" in the last 168 hours. No results for input(s): "LIPASE", "AMYLASE" in the last 168 hours. No results for input(s): "AMMONIA" in the last 168 hours. CBC: Recent Labs  Lab 10/12/22 1003 10/14/22 1421 10/16/22 0224 10/17/22 0359  WBC 8.0 7.2 7.2 7.5  HGB 12.2 12.5 12.2 12.3  HCT 36.9 37.8 36.6 37.8  MCV 91.8 92.4 92.9 93.3  PLT 207 239 244 256   Cardiac Enzymes: No results for input(s): "CKTOTAL", "CKMB", "CKMBINDEX", "TROPONINI" in the last 168 hours. BNP: Invalid input(s): "POCBNP" CBG: No results for input(s): "GLUCAP" in the last 168 hours. D-Dimer No results for input(s): "DDIMER" in the last 72 hours. Hgb A1c No results for input(s): "HGBA1C" in the last 72 hours. Lipid Profile No results for input(s): "CHOL", "HDL", "LDLCALC", "TRIG", "CHOLHDL", "LDLDIRECT" in the last 72 hours. Thyroid function studies No results for input(s): "TSH", "T4TOTAL", "T3FREE", "THYROIDAB" in the last 72 hours.  Invalid input(s): "FREET3" Anemia work up No results for input(s): "VITAMINB12", "FOLATE", "FERRITIN", "TIBC", "IRON", "RETICCTPCT" in the last 72 hours. Urinalysis    Component Value Date/Time   COLORURINE YELLOW (A) 10/06/2022 1328   APPEARANCEUR CLEAR (A) 10/06/2022 1328   LABSPEC 1.013 10/06/2022 1328   PHURINE 7.0 10/06/2022 1328   GLUCOSEU NEGATIVE 10/06/2022 1328   HGBUR NEGATIVE 10/06/2022 [place] 10/06/2022 1328   BILIRUBINUR neg 11/10/2015 1352   KETONESUR NEGATIVE 10/06/2022 1328   PROTEINUR 30 (A) 10/06/2022 1328   UROBILINOGEN negative 11/10/2015 1352   UROBILINOGEN 0.2 11/20/2013 0201   NITRITE NEGATIVE 10/06/2022 1328   LEUKOCYTESUR NEGATIVE 10/06/2022 1328   Sepsis Labs Recent Labs  Lab  10/12/22 1003 10/14/22 1421 10/16/22 0224 10/17/22 0359  WBC 8.0 7.2 7.2 7.5   Microbiology Recent Results (from the past 240 hour(s))  CSF culture w Gram Stain     Status: None   Collection Time: 10/08/22  3:30 PM   Specimen: CSF; Cerebrospinal Fluid  Result Value Ref Range Status   Specimen Description   Final    CSF Performed at [facility], [address], [place], [place] [address]    Special Requests   Final    NONE Performed at [facility], [address] [place], [place] [address]    Gram Stain   Final    NO ORGANISMS SEEN WBC SEEN RED BLOOD CELLS SEEN Performed at [facility], [address], [place], [place] [address]    Culture   Final    NO GROWTH 3 DAYS Performed at [facility], [place] [address], [place], [place] [address]  Report Status 10/11/2022 FINAL  Final   Imaging ECHOCARDIOGRAM COMPLETE  Result Date: 10/15/2022    ECHOCARDIOGRAM REPORT   Patient Name:   Donna Evans Date of Exam: 10/14/2022 Medical Rec #:  [id number]      Height:       65.0 in Accession #:    [id number]     Weight:       141.1 lb Date of Birth:  Aug 12, 1946       BSA:          1.706 m Patient Age:    75 years       BP:           122/97 mmHg Patient Gender: F              HR:           145 bpm. Exam Location:  [facility] Procedure: 2D Echo, Color Doppler and Cardiac Doppler Indications:     I48.91 Atrial fibrillation  History:         Patient has prior history of Echocardiogram examinations, most                  recent 04/29/2022. COPD and Stroke; Risk Factors:Hypertension.  Sonographer:     Charmayne Sheer Referring Phys:  [id number] Emeterio Reeve Diagnosing Phys: Yolonda Kida MD  Sonographer Comments: Suboptimal parasternal window and suboptimal apical window. Image acquisition challenging due to breast implants and Image acquisition challenging due to COPD. IMPRESSIONS  1. TDS.  2. Left ventricular ejection fraction, by estimation, is 55 to 60%. The left  ventricle has normal function. The left ventricle has no regional wall motion abnormalities. There is mild left ventricular hypertrophy. Left ventricular diastolic function could not be evaluated.  3. Right ventricular systolic function is normal. The right ventricular size is normal.  4. The mitral valve is normal in structure. No evidence of mitral valve regurgitation.  5. The aortic valve is normal in structure. Aortic valve regurgitation is not visualized. Conclusion(s)/Recommendation(s): Poor windows for evaluation of left ventricular function by transthoracic echocardiography. Would recommend an alternative means of evaluation. FINDINGS  Left Ventricle: Left ventricular ejection fraction, by estimation, is 55 to 60%. The left ventricle has normal function. The left ventricle has no regional wall motion abnormalities. The left ventricular internal cavity size was normal in size. There is  mild left ventricular hypertrophy. Left ventricular diastolic function could not be evaluated. Right Ventricle: The right ventricular size is normal. No increase in right ventricular wall thickness. Right ventricular systolic function is normal. Left Atrium: Left atrial size was normal in size. Right Atrium: Right atrial size was normal in size. Pericardium: There is no evidence of pericardial effusion. Mitral Valve: The mitral valve is normal in structure. No evidence of mitral valve regurgitation. Tricuspid Valve: The tricuspid valve is normal in structure. Tricuspid valve regurgitation is trivial. Aortic Valve: The aortic valve is normal in structure. Aortic valve regurgitation is not visualized. Aortic valve mean gradient measures 2.0 mmHg. Aortic valve peak gradient measures 3.8 mmHg. Aortic valve area, by VTI measures 2.63 cm. Pulmonic Valve: The pulmonic valve was grossly normal. Pulmonic valve regurgitation is not visualized. Aorta: The ascending aorta was not well visualized. IAS/Shunts: No atrial level shunt  detected by color flow Doppler. Additional Comments: TDS.  LEFT VENTRICLE PLAX 2D LVIDd:         3.40 cm   Diastology LVIDs:         2.30 cm   LV  e' medial:    6.42 cm/s LV PW:         1.40 cm   LV E/e' medial:  12.6 LV IVS:        1.00 cm   LV e' lateral:   9.90 cm/s LVOT diam:     1.60 cm   LV E/e' lateral: 8.2 LV SV:         35 LV SV Index:   21 LVOT Area:     2.01 cm  RIGHT VENTRICLE RV Basal diam:  2.20 cm LEFT ATRIUM           Index        RIGHT ATRIUM          Index LA diam:      3.00 cm 1.76 cm/m   RA Area:     9.16 cm LA Vol (A2C): 14.7 ml 8.62 ml/m   RA Volume:   19.40 ml 11.37 ml/m LA Vol (A4C): 26.5 ml 15.54 ml/m  AORTIC VALVE                    PULMONIC VALVE AV Area (Vmax):    2.53 cm     PV Vmax:       1.15 m/s AV Area (Vmean):   2.52 cm     PV Vmean:      81.200 cm/s AV Area (VTI):     2.63 cm     PV VTI:        0.141 m AV Vmax:           97.80 cm/s   PV Peak grad:  5.3 mmHg AV Vmean:          69.600 cm/s  PV Mean grad:  3.0 mmHg AV VTI:            0.134 m AV Peak Grad:      3.8 mmHg AV Mean Grad:      2.0 mmHg LVOT Vmax:         123.00 cm/s LVOT Vmean:        87.100 cm/s LVOT VTI:          0.175 m LVOT/AV VTI ratio: 1.31  AORTA Ao Root diam: 3.10 cm MITRAL VALVE MV Area (PHT): 9.85 cm    SHUNTS MV Decel Time: 77 msec     Systemic VTI:  0.18 m MV E velocity: 80.70 cm/s  Systemic Diam: 1.60 cm Dwayne Prince [place] MD Electronically signed by Yolonda Kida MD Signature Date/Time: 10/15/2022/1:18:38 PM    Final       Time coordinating discharge: over 30 minutes  SIGNED:  Emeterio Reeve DO [facility]

## 2022-10-18 NOTE — Progress Notes (Addendum)
Physical Therapy Re-Evaluation Patient Details Name: Donna Evans MRN: [id number] DOB: [birth year] Today's Date: 10/18/2022   History of Present Illness Pt is a 76 y.o. female s/p unwitnessed fall; confusion and difficulty understanding pt's speech noted.  Per chart pt has been having trouble with balance and frequent falls lately.  Pt admitted with acute encephalopathy, ground level fall, B LE lymphedema, and elevated LFT's.  Multiple chronic R rib fx's noted on imaging. transferred to higher level of care due to new afib with RVR.  PMH includes htn, prior CVA (unknown deficits), GERD, breast CA s/p mastectomy, chronic pain syndrome, CKD stage IIIa, COPD, SLE, depression with anxiety, migraine headache, fibromyalgia, B foot fx, peripheral neuropathy, L humeral IM nail 12/31/2019.    PT Comments    Patient alert, oriented, did not report any pain during session. The patient was seen as a re-evaluation, goals updated as needed. The patient was able to perform bed mobility modI, good sitting balance noted. Sit <> stand with RW and CGA, HR and spO2 monitored throughout mobility, and WFLs. She ambulated ~161f with RW and CGA, no true LOB noted. Pt/PT discussed fall prevention at home, assistance, DME. Plan updated at this time is HHPT with 24/7 supervision to maximize safety and function.      Recommendations for follow up therapy are one component of a multi-disciplinary discharge planning process, led by the attending physician.  Recommendations may be updated based on patient status, additional functional criteria and insurance authorization.  Follow Up Recommendations  Home health PT Can patient physically be transported by private vehicle: Yes   Assistance Recommended at Discharge Frequent or constant Supervision/Assistance  Patient can return home with the following A little help with walking and/or transfers;A little help with bathing/dressing/bathroom;Assistance with cooking/housework;Help  with stairs or ramp for entrance;Assist for transportation   Equipment Recommendations  Rolling walker (2 wheels);BSC/3in1    Recommendations for Other Services OT consult     Precautions / Restrictions Precautions Precautions: Fall Restrictions Weight Bearing Restrictions: No     Mobility  Bed Mobility Overal bed mobility: Modified Independent                  Transfers Overall transfer level: Needs assistance Equipment used: Rolling walker (2 wheels) Transfers: Sit to/from Stand Sit to Stand: Supervision                Ambulation/Gait Ambulation/Gait assistance: Min guard Gait Distance (Feet): 100 Feet Assistive device: Rolling walker (2 wheels)         General Gait Details: no true LOB noted with ambulation this morning   Stairs             Wheelchair Mobility    Modified Rankin (Stroke Patients Only)       Balance Overall balance assessment: Needs assistance Sitting-balance support: No upper extremity supported, Feet supported Sitting balance-Leahy Scale: Good     Standing balance support: Bilateral upper extremity supported, During functional activity Standing balance-Leahy Scale: Fair                              Cognition Arousal/Alertness: Awake/alert Behavior During Therapy: WFL for tasks assessed/performed Overall Cognitive Status: No family/caregiver present to determine baseline cognitive functioning                                 General Comments: answered all orientation questions  Exercises      General Comments        Pertinent Vitals/Pain Pain Assessment Pain Assessment: No/denies pain    Home Living                          Prior Function            PT Goals (current goals can now be found in the care plan section) Progress towards PT goals: Progressing toward goals    Frequency    Min 2X/week      PT Plan Current plan remains appropriate     Co-evaluation              AM-PAC PT "6 Clicks" Mobility   Outcome Measure  Help needed turning from your back to your side while in a flat bed without using bedrails?: None Help needed moving from lying on your back to sitting on the side of a flat bed without using bedrails?: None Help needed moving to and from a bed to a chair (including a wheelchair)?: None Help needed standing up from a chair using your arms (e.g., wheelchair or bedside chair)?: A Little Help needed to walk in hospital room?: A Little Help needed climbing 3-5 steps with a railing? : A Little 6 Click Score: 21    End of Session Equipment Utilized During Treatment: Gait belt Activity Tolerance: Patient tolerated treatment well Patient left: in bed;with call bell/phone within reach;with bed alarm set Nurse Communication: Mobility status PT Visit Diagnosis: Other abnormalities of gait and mobility (R26.89);Muscle weakness (generalized) (M62.81);History of falling (Z91.81);Repeated falls (R29.6);Pain Pain - part of body:  (back)     Time: 7017-7939 PT Time Calculation (min) (ACUTE ONLY): 22 min  Charges:  $Therapeutic Activity: 8-22 mins                     Lieutenant Diego PT, DPT 10:25 AM,10/18/22

## 2022-10-18 NOTE — TOC CM/SW Note (Signed)
Patient is not able to walk the distance required to go the bathroom, or he/she is unable to safely negotiate stairs required to access the bathroom.  A 3in1 BSC will alleviate this problem  

## 2022-10-24 DIAGNOSIS — I480 Paroxysmal atrial fibrillation: Secondary | ICD-10-CM | POA: Diagnosis not present

## 2022-10-25 DIAGNOSIS — J301 Allergic rhinitis due to pollen: Secondary | ICD-10-CM | POA: Diagnosis not present

## 2022-10-25 DIAGNOSIS — I10 Essential (primary) hypertension: Secondary | ICD-10-CM | POA: Diagnosis not present

## 2022-10-25 DIAGNOSIS — G4709 Other insomnia: Secondary | ICD-10-CM | POA: Diagnosis not present

## 2022-10-25 DIAGNOSIS — N189 Chronic kidney disease, unspecified: Secondary | ICD-10-CM | POA: Diagnosis not present

## 2022-10-25 DIAGNOSIS — M7061 Trochanteric bursitis, right hip: Secondary | ICD-10-CM | POA: Diagnosis not present

## 2022-10-25 DIAGNOSIS — K219 Gastro-esophageal reflux disease without esophagitis: Secondary | ICD-10-CM | POA: Diagnosis not present

## 2022-10-25 DIAGNOSIS — I739 Peripheral vascular disease, unspecified: Secondary | ICD-10-CM | POA: Diagnosis not present

## 2022-10-25 DIAGNOSIS — F411 Generalized anxiety disorder: Secondary | ICD-10-CM | POA: Diagnosis not present

## 2022-10-25 DIAGNOSIS — I480 Paroxysmal atrial fibrillation: Secondary | ICD-10-CM | POA: Diagnosis not present

## 2022-10-25 DIAGNOSIS — M810 Age-related osteoporosis without current pathological fracture: Secondary | ICD-10-CM | POA: Diagnosis not present

## 2022-10-25 DIAGNOSIS — G8929 Other chronic pain: Secondary | ICD-10-CM | POA: Diagnosis not present

## 2022-10-25 DIAGNOSIS — E785 Hyperlipidemia, unspecified: Secondary | ICD-10-CM | POA: Diagnosis not present

## 2022-10-27 DIAGNOSIS — I10 Essential (primary) hypertension: Secondary | ICD-10-CM | POA: Diagnosis not present

## 2022-10-27 DIAGNOSIS — R600 Localized edema: Secondary | ICD-10-CM | POA: Diagnosis not present

## 2022-10-27 DIAGNOSIS — R0602 Shortness of breath: Secondary | ICD-10-CM | POA: Diagnosis not present

## 2022-10-27 DIAGNOSIS — I480 Paroxysmal atrial fibrillation: Secondary | ICD-10-CM | POA: Diagnosis not present

## 2022-10-27 DIAGNOSIS — I872 Venous insufficiency (chronic) (peripheral): Secondary | ICD-10-CM | POA: Diagnosis not present

## 2022-10-27 DIAGNOSIS — Z9889 Other specified postprocedural states: Secondary | ICD-10-CM | POA: Diagnosis not present

## 2022-10-28 DIAGNOSIS — I480 Paroxysmal atrial fibrillation: Secondary | ICD-10-CM | POA: Diagnosis not present

## 2022-10-29 ENCOUNTER — Other Ambulatory Visit: Payer: Self-pay

## 2022-10-29 ENCOUNTER — Emergency Department
Admission: EM | Admit: 2022-10-29 | Discharge: 2022-10-29 | Disposition: A | Discharge status: Home / Self Care | Payer: PPO | Attending: Emergency Medicine | Admitting: Emergency Medicine

## 2022-10-29 ENCOUNTER — Emergency Department: Payer: PPO

## 2022-10-29 DIAGNOSIS — I509 Heart failure, unspecified: Secondary | ICD-10-CM | POA: Insufficient documentation

## 2022-10-29 DIAGNOSIS — R0689 Other abnormalities of breathing: Secondary | ICD-10-CM | POA: Diagnosis not present

## 2022-10-29 DIAGNOSIS — R42 Dizziness and giddiness: Secondary | ICD-10-CM | POA: Diagnosis not present

## 2022-10-29 DIAGNOSIS — R519 Headache, unspecified: Secondary | ICD-10-CM | POA: Insufficient documentation

## 2022-10-29 DIAGNOSIS — R531 Weakness: Secondary | ICD-10-CM | POA: Insufficient documentation

## 2022-10-29 DIAGNOSIS — I4891 Unspecified atrial fibrillation: Secondary | ICD-10-CM | POA: Insufficient documentation

## 2022-10-29 DIAGNOSIS — I959 Hypotension, unspecified: Secondary | ICD-10-CM | POA: Diagnosis not present

## 2022-10-29 DIAGNOSIS — R55 Syncope and collapse: Secondary | ICD-10-CM | POA: Insufficient documentation

## 2022-10-29 LAB — CBC WITH DIFFERENTIAL/PLATELET
Abs Immature Granulocytes: 0.02 10*3/uL (ref 0.00–0.07)
Basophils Absolute: 0.1 10*3/uL (ref 0.0–0.1)
Basophils Relative: 1 %
Eosinophils Absolute: 0.8 10*3/uL — ABNORMAL HIGH (ref 0.0–0.5)
Eosinophils Relative: 10 %
HCT: 35 % — ABNORMAL LOW (ref 36.0–46.0)
Hemoglobin: 11.2 g/dL — ABNORMAL LOW (ref 12.0–15.0)
Immature Granulocytes: 0 %
Lymphocytes Relative: 21 %
Lymphs Abs: 1.6 10*3/uL (ref 0.7–4.0)
MCH: 30.3 pg (ref 26.0–34.0)
MCHC: 32 g/dL (ref 30.0–36.0)
MCV: 94.6 fL (ref 80.0–100.0)
Monocytes Absolute: 0.5 10*3/uL (ref 0.1–1.0)
Monocytes Relative: 7 %
Neutro Abs: 4.8 10*3/uL (ref 1.7–7.7)
Neutrophils Relative %: 61 %
Platelets: 171 10*3/uL (ref 150–400)
RBC: 3.7 MIL/uL — ABNORMAL LOW (ref 3.87–5.11)
RDW: 14.5 % (ref 11.5–15.5)
WBC: 7.9 10*3/uL (ref 4.0–10.5)
nRBC: 0 % (ref 0.0–0.2)

## 2022-10-29 LAB — BASIC METABOLIC PANEL
Anion gap: 7 (ref 5–15)
BUN: 54 mg/dL — ABNORMAL HIGH (ref 8–23)
CO2: 22 mmol/L (ref 22–32)
Calcium: 8.9 mg/dL (ref 8.9–10.3)
Chloride: 114 mmol/L — ABNORMAL HIGH (ref 98–111)
Creatinine, Ser: 1.42 mg/dL — ABNORMAL HIGH (ref 0.44–1.00)
GFR, Estimated: 38 mL/min — ABNORMAL LOW (ref >60)
Glucose, Bld: 118 mg/dL — ABNORMAL HIGH (ref 70–99)
Potassium: 4.4 mmol/L (ref 3.5–5.1)
Sodium: 143 mmol/L (ref 135–145)

## 2022-10-29 LAB — TROPONIN I (HIGH SENSITIVITY)
Troponin I (High Sensitivity): 12 ng/L (ref <18)
Troponin I (High Sensitivity): 11 ng/L (ref <18)

## 2022-10-29 LAB — CBG MONITORING, ED: Glucose-Capillary: 82 mg/dL (ref 70–99)

## 2022-10-29 LAB — BRAIN NATRIURETIC PEPTIDE: B Natriuretic Peptide: 518.2 pg/mL — ABNORMAL HIGH (ref 0.0–100.0)

## 2022-10-29 MED ORDER — SODIUM CHLORIDE 0.9 % IV BOLUS: 1000.0000 mL | Freq: Once | INTRAVENOUS | Status: DC

## 2022-10-29 MED ORDER — SODIUM CHLORIDE 0.9 % IV BOLUS
500.0000 mL | Freq: Once | INTRAVENOUS | Status: AC
  Administered 2022-10-29: 500 mL via INTRAVENOUS

## 2022-10-29 NOTE — Discharge Instructions (Signed)
Please seek medical attention for any high fevers, chest pain, shortness of breath, change in behavior, persistent vomiting, bloody stool or any other new or concerning symptoms.  

## 2022-10-29 NOTE — ED Notes (Signed)
MD has been to the bedside.  No code stroke called at this time

## 2022-10-29 NOTE — ED Provider Notes (Signed)
[facility] Provider Note    Event Date/Time   First MD Initiated Contact with Patient 10/29/22 1517     (approximate)   History   Weakness   HPI  Donna Evans is a 76 y.o. female  who presents to the emergency department today after a syncopal episode. Episode was witnessed by aide who is present at bedside and is able to give some history.  The patient states that she had had a normal morning.  Her and her aide had just come back from taking them off.  She was sitting at her tingle.  The aide stated that she noticed the patient leaning her head back and having her eyes rolled back.  She was unable to wake her but she went out again.  Patient denies any chest pain.  Did have recent admission and was diagnosed with A-fib.  She states that since discharge roughly 10 days ago she is felt generalized weak.  She denies any focal weakness or numbness to myself. States she has had a headache for the past 2 days, and gets migraines, but this felt different in that it was located towards the front of her head.     Physical Exam   Triage Vital Signs: ED Triage Vitals  Enc Vitals Group     BP 10/29/22 1455 113/70     Pulse Rate 10/29/22 1455 62     Resp 10/29/22 1455 19     Temp 10/29/22 1455 98 F (36.7 C)     Temp Source 10/29/22 1455 Oral     SpO2 10/29/22 1455 100 %     Weight 10/29/22 1453 122 lb 9.2 oz (55.6 kg)     Height 10/29/22 1453 '5\' 5"'$  (1.651 m)     Head Circumference --      Peak Flow --      Pain Score 10/29/22 1453 0     Pain Loc --      Pain Edu? --      Excl. in [place]? --     Most recent vital signs: Vitals:   10/29/22 1455  BP: 113/70  Pulse: 62  Resp: 19  Temp: 98 F (36.7 C)  SpO2: 100%   General: Awake, alert, oriented. CV:  Good peripheral perfusion. Regular rate and rhythm.  Resp:  Normal effort. Lungs clear. Abd:  No distention.    ED Results / Procedures / Treatments   Labs (all labs ordered are listed, but only  abnormal results are displayed) Labs Reviewed  BRAIN NATRIURETIC PEPTIDE - Abnormal; Notable for the following components:      Result Value   B Natriuretic Peptide 518.2 (*)    All other components within normal limits  CBC WITH DIFFERENTIAL/PLATELET - Abnormal; Notable for the following components:   RBC 3.70 (*)    Hemoglobin 11.2 (*)    HCT 35.0 (*)    Eosinophils Absolute 0.8 (*)    All other components within normal limits  BASIC METABOLIC PANEL - Abnormal; Notable for the following components:   Chloride 114 (*)    Glucose, Bld 118 (*)    BUN 54 (*)    Creatinine, Ser 1.42 (*)    GFR, Estimated 38 (*)    All other components within normal limits  CBG MONITORING, ED  TROPONIN I (HIGH SENSITIVITY)  TROPONIN I (HIGH SENSITIVITY)     EKG  I, Nance Pear, attending physician, personally viewed and interpreted this EKG  EKG Time: 1506 Rate:  60 Rhythm: sinus rhythm Axis: normal Intervals: qtc 459 QRS: IVCD ST changes: no st elevation Impression: abnormal ekg  RADIOLOGY I independently interpreted and visualized the CT head. My interpretation: No acute intracranial bleed Radiology interpretation:  IMPRESSION:  1. No acute intracranial abnormality or significant interval change.  2. Stable atrophy and white matter disease. This likely reflects the  sequela of chronic microvascular ischemia.   I independently interpreted and visualized the CXR. My interpretation: No pneumonia Radiology interpretation: IMPRESSION:  No active disease.    PROCEDURES:  Critical Care performed: No  Procedures   MEDICATIONS ORDERED IN ED: Medications - No data to display   IMPRESSION / MDM / [place] / ED COURSE  I reviewed the triage vital signs and the nursing notes.                              Differential diagnosis includes, but is not limited to, dehydration, ACS, arrythmia.  Patient's presentation is most consistent with acute presentation with  potential threat to life or bodily function.  The patient is on the cardiac monitor to evaluate for evidence of arrhythmia and/or significant heart rate changes.  Patient presented to the emergency department today after syncopal episode.  The time my exam patient is awake alert and oriented. Broad work up was initiated. Head CT without concerning findings. Blood work without significant anemia, electrolyte abnormality or elevated troponin. BNP was slightly elevated however CXR without any edema. Additionally patient did have echo performed earlier this month with good ejection fraction.  The patient was given IV fluids. Then ambulated with nursing staff. Did not have any lightheadedness. At this time somewhat unclear etiology of the syncope. However given that she feels improved I do think is reasonable for patient to be discharged. Discussed return precautions.      FINAL CLINICAL IMPRESSION(S) / ED DIAGNOSES   Final diagnoses:  Syncope, unspecified syncope type      Note:  This document was prepared using Dragon voice recognition software and may include unintentional dictation errors.    Nance Pear, MD 10/29/22 [phone]

## 2022-10-29 NOTE — ED Notes (Signed)
This RN sent down blue, green and lavender tubes.

## 2022-10-29 NOTE — ED Triage Notes (Signed)
BIB [facility] from home. Pt called for witnessed syncope. Caregiver saw syncope. Lasted 10 seconds. Pt positive for orthostatic changes for EMS. 98/64 80/49 IV established. 18 LAC. Pt is CAO at baseline. Pt feels more tired than normal. Seen 4 days ago for CHF and new AFIB.  Pt complaint of weakness and lightheadedness. Pt now too weak to walk.  New pressure 98/ 500 CC bolus Denies pain.

## 2022-10-29 NOTE — ED Notes (Signed)
Patient is noted to be normal sinus rhythm since arrival to the ED

## 2022-10-29 NOTE — ED Notes (Signed)
Patient and family verbalized understanding of discharge instructions and reasons to return to the ED.  Patient taken to the car in a wheelchair.  No neuro deficits noted.  Speech is baseline per the family.  She will need ongoing assistance with ambulation and ADLs due to her unsteady gait

## 2022-10-29 NOTE — ED Notes (Signed)
ERMD notified of patient sx and reported sensory changes.

## 2022-10-29 NOTE — ED Notes (Signed)
Daughter reports patient had a syncopal episode approx 1 month ago.  She was found in floor by friends.  She was admitted and found to have new dx of afib.  She was started on Eloquis at discharge.  Family reports she is taking her meds as directed.  She denies having any pain medications recently.  Patient was seen by her MD with no changes to her medications.  Patient also has diagnosis of heart failure.  She has noted swelling to right lower leg.  Patient is voiding per usual per her caregiver.  Patient does not recall her syncopal event today.  She recalls waking with her caregiver in her face.

## 2022-10-29 NOTE — ED Notes (Signed)
Patient remains alert and oriented.  She tolerated po snack.  Patient up to ambulate and reports she feels like she normally does.  She has unsteady gait.  She uses a rolling walker at home.  Will inform MD.  She denies any shortness of breath or dizziness

## 2022-10-29 NOTE — ED Notes (Signed)
Patient remains alert and oriented.  Family at bedside.  No new neuro deficit noted

## 2022-11-01 ENCOUNTER — Observation Stay
Admission: EM | Admit: 2022-11-01 | Discharge: 2022-11-02 | Disposition: A | Discharge status: Home Health | Payer: PPO | Attending: Internal Medicine | Admitting: Internal Medicine

## 2022-11-01 ENCOUNTER — Other Ambulatory Visit: Payer: Self-pay

## 2022-11-01 ENCOUNTER — Emergency Department: Payer: PPO

## 2022-11-01 ENCOUNTER — Observation Stay: Payer: PPO

## 2022-11-01 ENCOUNTER — Encounter: Payer: Self-pay | Admitting: Emergency Medicine

## 2022-11-01 DIAGNOSIS — Z87891 Personal history of nicotine dependence: Secondary | ICD-10-CM | POA: Diagnosis not present

## 2022-11-01 DIAGNOSIS — N1831 Chronic kidney disease, stage 3a: Secondary | ICD-10-CM | POA: Insufficient documentation

## 2022-11-01 DIAGNOSIS — I5030 Unspecified diastolic (congestive) heart failure: Secondary | ICD-10-CM | POA: Insufficient documentation

## 2022-11-01 DIAGNOSIS — Z7901 Long term (current) use of anticoagulants: Secondary | ICD-10-CM | POA: Insufficient documentation

## 2022-11-01 DIAGNOSIS — Z853 Personal history of malignant neoplasm of breast: Secondary | ICD-10-CM | POA: Diagnosis not present

## 2022-11-01 DIAGNOSIS — J449 Chronic obstructive pulmonary disease, unspecified: Secondary | ICD-10-CM | POA: Diagnosis not present

## 2022-11-01 DIAGNOSIS — Z91040 Latex allergy status: Secondary | ICD-10-CM | POA: Insufficient documentation

## 2022-11-01 DIAGNOSIS — I130 Hypertensive heart and chronic kidney disease with heart failure and stage 1 through stage 4 chronic kidney disease, or unspecified chronic kidney disease: Secondary | ICD-10-CM | POA: Insufficient documentation

## 2022-11-01 DIAGNOSIS — E872 Acidosis, unspecified: Secondary | ICD-10-CM | POA: Insufficient documentation

## 2022-11-01 DIAGNOSIS — Y93E3 Activity, vacuuming: Secondary | ICD-10-CM | POA: Diagnosis not present

## 2022-11-01 DIAGNOSIS — Z8673 Personal history of transient ischemic attack (TIA), and cerebral infarction without residual deficits: Secondary | ICD-10-CM | POA: Insufficient documentation

## 2022-11-01 DIAGNOSIS — J439 Emphysema, unspecified: Secondary | ICD-10-CM | POA: Diagnosis not present

## 2022-11-01 DIAGNOSIS — I10 Essential (primary) hypertension: Secondary | ICD-10-CM | POA: Diagnosis present

## 2022-11-01 DIAGNOSIS — Z79899 Other long term (current) drug therapy: Secondary | ICD-10-CM | POA: Diagnosis not present

## 2022-11-01 DIAGNOSIS — K7581 Nonalcoholic steatohepatitis (NASH): Secondary | ICD-10-CM | POA: Insufficient documentation

## 2022-11-01 DIAGNOSIS — N183 Chronic kidney disease, stage 3 unspecified: Secondary | ICD-10-CM | POA: Insufficient documentation

## 2022-11-01 DIAGNOSIS — I480 Paroxysmal atrial fibrillation: Secondary | ICD-10-CM | POA: Insufficient documentation

## 2022-11-01 DIAGNOSIS — R4182 Altered mental status, unspecified: Secondary | ICD-10-CM

## 2022-11-01 DIAGNOSIS — R55 Syncope and collapse: Principal | ICD-10-CM | POA: Diagnosis present

## 2022-11-01 DIAGNOSIS — W010XXA Fall on same level from slipping, tripping and stumbling without subsequent striking against object, initial encounter: Secondary | ICD-10-CM | POA: Insufficient documentation

## 2022-11-01 DIAGNOSIS — S0003XA Contusion of scalp, initial encounter: Secondary | ICD-10-CM | POA: Diagnosis not present

## 2022-11-01 DIAGNOSIS — S199XXA Unspecified injury of neck, initial encounter: Secondary | ICD-10-CM | POA: Diagnosis not present

## 2022-11-01 DIAGNOSIS — R001 Bradycardia, unspecified: Secondary | ICD-10-CM | POA: Diagnosis not present

## 2022-11-01 LAB — CBC WITH DIFFERENTIAL/PLATELET
Abs Immature Granulocytes: 0.02 10*3/uL (ref 0.00–0.07)
Basophils Absolute: 0.1 10*3/uL (ref 0.0–0.1)
Basophils Relative: 1 %
Eosinophils Absolute: 0.5 10*3/uL (ref 0.0–0.5)
Eosinophils Relative: 6 %
HCT: 38.8 % (ref 36.0–46.0)
Hemoglobin: 12.5 g/dL (ref 12.0–15.0)
Immature Granulocytes: 0 %
Lymphocytes Relative: 14 %
Lymphs Abs: 1.3 10*3/uL (ref 0.7–4.0)
MCH: 30.8 pg (ref 26.0–34.0)
MCHC: 32.2 g/dL (ref 30.0–36.0)
MCV: 95.6 fL (ref 80.0–100.0)
Monocytes Absolute: 0.5 10*3/uL (ref 0.1–1.0)
Monocytes Relative: 5 %
Neutro Abs: 6.7 10*3/uL (ref 1.7–7.7)
Neutrophils Relative %: 74 %
Platelets: 180 10*3/uL (ref 150–400)
RBC: 4.06 MIL/uL (ref 3.87–5.11)
RDW: 14.3 % (ref 11.5–15.5)
WBC: 9.1 10*3/uL (ref 4.0–10.5)
nRBC: 0 % (ref 0.0–0.2)

## 2022-11-01 LAB — URINALYSIS, ROUTINE W REFLEX MICROSCOPIC
Bilirubin Urine: NEGATIVE
Glucose, UA: NEGATIVE mg/dL
Hgb urine dipstick: NEGATIVE
Ketones, ur: NEGATIVE mg/dL
Leukocytes,Ua: NEGATIVE
Nitrite: NEGATIVE
Protein, ur: NEGATIVE mg/dL
Specific Gravity, Urine: 1.011 (ref 1.005–1.030)
pH: 5 (ref 5.0–8.0)

## 2022-11-01 LAB — COMPREHENSIVE METABOLIC PANEL
ALT: 111 U/L — ABNORMAL HIGH (ref 0–44)
AST: 104 U/L — ABNORMAL HIGH (ref 15–41)
Albumin: 3.8 g/dL (ref 3.5–5.0)
Alkaline Phosphatase: 825 U/L — ABNORMAL HIGH (ref 38–126)
Anion gap: 8 (ref 5–15)
BUN: 44 mg/dL — ABNORMAL HIGH (ref 8–23)
CO2: 20 mmol/L — ABNORMAL LOW (ref 22–32)
Calcium: 9.8 mg/dL (ref 8.9–10.3)
Chloride: 116 mmol/L — ABNORMAL HIGH (ref 98–111)
Creatinine, Ser: 1.22 mg/dL — ABNORMAL HIGH (ref 0.44–1.00)
GFR, Estimated: 46 mL/min — ABNORMAL LOW (ref >60)
Glucose, Bld: 121 mg/dL — ABNORMAL HIGH (ref 70–99)
Potassium: 4 mmol/L (ref 3.5–5.1)
Sodium: 144 mmol/L (ref 135–145)
Total Bilirubin: 0.9 mg/dL (ref 0.3–1.2)
Total Protein: 7.3 g/dL (ref 6.5–8.1)

## 2022-11-01 LAB — URINE DRUG SCREEN, QUALITATIVE (ARMC ONLY)
Amphetamines, Ur Screen: NOT DETECTED
Barbiturates, Ur Screen: NOT DETECTED
Benzodiazepine, Ur Scrn: POSITIVE — AB
Cannabinoid 50 Ng, Ur ~~LOC~~: NOT DETECTED
Cocaine Metabolite,Ur ~~LOC~~: NOT DETECTED
MDMA (Ecstasy)Ur Screen: NOT DETECTED
Methadone Scn, Ur: NOT DETECTED
Opiate, Ur Screen: NOT DETECTED
Phencyclidine (PCP) Ur S: NOT DETECTED
Tricyclic, Ur Screen: POSITIVE — AB

## 2022-11-01 LAB — APTT: aPTT: 38 seconds — ABNORMAL HIGH (ref 24–36)

## 2022-11-01 LAB — AMMONIA: Ammonia: 24 umol/L (ref 9–35)

## 2022-11-01 LAB — PROTIME-INR
INR: 1.8 — ABNORMAL HIGH (ref 0.8–1.2)
Prothrombin Time: 21 seconds — ABNORMAL HIGH (ref 11.4–15.2)

## 2022-11-01 LAB — TROPONIN I (HIGH SENSITIVITY)
Troponin I (High Sensitivity): 11 ng/L (ref <18)
Troponin I (High Sensitivity): 11 ng/L (ref <18)

## 2022-11-01 LAB — ETHANOL: Alcohol, Ethyl (B): <10 mg/dL (ref <10)

## 2022-11-01 NOTE — Progress Notes (Signed)
Reviewed consult for IV. Noted documentation of [place] IV placed by ED.

## 2022-11-01 NOTE — ED Provider Notes (Signed)
[facility] Provider Note    Event Date/Time   First MD Initiated Contact with Patient 11/01/22 1545     (approximate)   History   Fall   HPI  Donna Evans is a 76 y.o. female with past medical history of hypertension and stroke depression anxiety migraines breast cancer chronic pain HFpEF CKD COPD SLE who presents after a fall.  Patient tells me she was vacuuming and then fell backwards she does not really know why has had some lightheadedness off and on is not sure if she lost consciousness.  She has had multiple syncopal episodes and falls recently.  Was seen in the ED 3 days ago after syncopal episode.  She otherwise denies nausea vomiting diarrhea shortness of breath or chest pain.  Patient's daughter tells me that since the episode today she has been confused.  She has been talking to people that are not there and saying that she does not remember anything that happened yesterday.  Her daughter notes that she is typically not confused when she has these episodes of syncope she often has associated confusion.  Admitted recently recently for acute encephalopathy had negative brain MRI and an EEG that was also negative.  Evaluated by neurology who concluded that it was delirium on dementia.  Sedating medications were held.  Had elevated LFTs during that admission which were thought to be due to fatty liver.    Past Medical History:  Diagnosis Date   Allergic state    Anginal pain ([place])    Prinzmetal's angina   Anxiety    Arthritis    osteoarthritis   Breast cancer ([place]) 1990   right breast cancer   Chronic pain    Chronic pain    COPD (chronic obstructive pulmonary disease) (HCC)    Depression    Edema    Fibromyalgia    Foot fracture    Bilateral   GERD (gastroesophageal reflux disease)    History of kidney stones    Hypertension    Leg fracture, right    Low back pain    Lumbosacral neuritis    Medulloadrenal hyperfunction (HCC)     Migraine headache    Peripheral neuropathy    Shoulder fracture, right    Stroke ([place])    TIA   Systemic lupus erythematosus ([place])    Thyroid disease    TIA (transient ischemic attack) 11/28/2013   Transient global amnesia 2011    Patient Active Problem List   Diagnosis Date Noted   Paroxysmal atrial fibrillation with RVR ([place]) 10/14/2022   Delirium due to another medical condition 10/08/2022   Altered mental status    Bilateral leg edema    Community acquired pneumonia of left lower lobe of lung    Head injury    Ground-level fall 10/06/2022   Acute encephalopathy 10/06/2022   Lymphedema 10/06/2022   Elevated LFTs 10/06/2022   HLD (hyperlipidemia) 08/10/2022   Elevated alkaline phosphatase level 08/10/2022   Elevated troponin 04/28/2022   Stroke ([place]) 04/28/2022   Fall 04/28/2022   Acute renal failure superimposed on stage 3a chronic kidney disease ([place]) 04/28/2022   Chronic diastolic CHF (congestive heart failure) ([place]) 88/41/6606   Acute metabolic encephalopathy 30/16/0109   Venous insufficiency 03/31/2022   SOB (shortness of breath) on exertion 03/08/2022   Pedal edema 03/08/2022   Closed fracture of distal end of left radius 12/22/2021   Low back pain 12/17/2021   Fracture of pelvis ([place]) 11/13/2021   Bilateral  leg weakness 05/13/2021   Memory change 03/04/2021   Imbalance 03/04/2021   Falls frequently 03/04/2021   Dizziness 03/04/2021   Senile osteoporosis 10/18/2019   Long term current use of opiate analgesic 07/04/2017   Long term prescription opiate use 07/04/2017   Opiate use 07/04/2017   Bursitis of shoulder 07/04/2017   Closed fracture of upper end of humerus 07/04/2017   Knee pain 07/04/2017   Localized, primary osteoarthritis 07/04/2017   Osteoarthritis of knee 07/04/2017   Pain in limb 07/04/2017   Sprain of wrist 07/04/2017   Pain in joint involving ankle and foot 07/04/2017   Impingement syndrome of left shoulder region 06/29/2017   Trochanteric  bursitis 06/29/2017   AKI (acute kidney injury) ([place]) 10/01/2016   Syncope 09/30/2016   Migraine headache 08/10/2016   Controlled substance agreement signed 06/12/2016   Insomnia 06/12/2016   Hypertension, benign 02/04/2016   Easy bruisability 11/13/2015   Osteoporosis 11/13/2015   Fibromyalgia syndrome 11/13/2015   Generalized anxiety disorder 11/13/2015   Chronic pain syndrome 11/13/2015   Overactive detrusor 09/11/2015   Adrenal adenoma 09/02/2015   Chronic cystitis 09/02/2015   Incomplete bladder emptying 67/89/3810   Renal colic 17/51/0258   Urinary tract infection 07/28/2015   Gross hematuria 07/28/2015   History of reconstruction of both breasts 06/18/2015   S/P cardiac catheterization 06/18/2015   Acquired absence of both breasts and nipples 11/20/2014   Transient cerebral ischemia 11/28/2013   Hemiplegia of dominant side ([place]) 11/20/2013     Physical Exam  Triage Vital Signs: ED Triage Vitals  Enc Vitals Group     BP 11/01/22 1350 (!) 129/97     Pulse Rate 11/01/22 1350 (!) 45     Resp 11/01/22 1350 10     Temp 11/01/22 1350 (!) 97.5 F (36.4 C)     Temp Source 11/01/22 1350 Oral     SpO2 11/01/22 1350 94 %     Weight 11/01/22 1358 115 lb (52.2 kg)     Height --      Head Circumference --      Peak Flow --      Pain Score 11/01/22 1358 0     Pain Loc --      Pain Edu? --      Excl. in [place]? --     Most recent vital signs: Vitals:   11/01/22 1916 11/01/22 2106  BP: 109/62 110/73  Pulse: (!) 55 (!) 115  Resp: 16 16  Temp: 97.7 F (36.5 C)   SpO2: 99% 99%     General: Awake, no distress.  CV:  Good peripheral perfusion.  Resp:  Normal effort.  Abd:  No distention.  Neuro:             Awake, Alert, Oriented x 3, patient intermittently talking and not making much sense, moves all extremities symmetrically Other:  There is a hematoma on the left posterior occiput no overlying laceration, no midline C-spine tenderness, no midline T or L-spine  tenderness She has 5 out of 5 strength with plantarflexion dorsiflexion, hip extension   ED Results / Procedures / Treatments  Labs (all labs ordered are listed, but only abnormal results are displayed) Labs Reviewed  COMPREHENSIVE METABOLIC PANEL - Abnormal; Notable for the following components:      Result Value   Chloride 116 (*)    CO2 20 (*)    Glucose, Bld 121 (*)    BUN 44 (*)    Creatinine, Ser 1.22 (*)  AST 104 (*)    ALT 111 (*)    Alkaline Phosphatase 825 (*)    GFR, Estimated 46 (*)    All other components within normal limits  URINALYSIS, ROUTINE W REFLEX MICROSCOPIC - Abnormal; Notable for the following components:   Color, Urine YELLOW (*)    APPearance CLEAR (*)    All other components within normal limits  URINE DRUG SCREEN, QUALITATIVE ([facility] ONLY) - Abnormal; Notable for the following components:   Tricyclic, Ur Screen POSITIVE (*)    Benzodiazepine, Ur Scrn POSITIVE (*)    All other components within normal limits  PROTIME-INR - Abnormal; Notable for the following components:   Prothrombin Time 21.0 (*)    INR 1.8 (*)    All other components within normal limits  APTT - Abnormal; Notable for the following components:   aPTT 38 (*)    All other components within normal limits  CBC WITH DIFFERENTIAL/PLATELET  AMMONIA  ETHANOL  COMPREHENSIVE METABOLIC PANEL  CBC  MAGNESIUM  TROPONIN I (HIGH SENSITIVITY)  TROPONIN I (HIGH SENSITIVITY)     EKG  EKG showing sinus bradycardia with PAC, incomplete right bundle branch block no acute ischemic changes   RADIOLOGY I reviewed and interpreted the CT scan of the brain which does not show any acute intracranial process    PROCEDURES:  Critical Care performed: No  Procedures  The patient is on the cardiac monitor to evaluate for evidence of arrhythmia and/or significant heart rate changes.   MEDICATIONS ORDERED IN ED: Medications - No data to display   IMPRESSION / MDM / [place] /  ED COURSE  I reviewed the triage vital signs and the nursing notes.                              Patient's presentation is most consistent with acute presentation with potential threat to life or bodily function.  Differential diagnosis includes, but is not limited to, UTI, metabolic encephalopathy, polypharmacy, arrhythmia hypovolemia anemia  Patient is a 76 year old female presenting with syncope/fall and a change in mental status.  Patient tells me she was vacuuming when she fell and lost consciousness.  She had no prodrome.  Has had similar episodes in the past and has seen cardiology apparently.  Her family member who is at bedside notes that she has been confused since this is not atypical, she has these episodes.  Last was hospitalized about 2 weeks ago for cephalopathy and underwent an extensive work-up including MRI EEG.  Was seen by neurology felt that it was delirium on top of dementia attentionally meds contributing.  Patient is on Ambien and takes Flexeril for pain.  Her vitals are notable for sinus bradycardia but vitals are otherwise reassuring overall she is nontoxic-appearing.  Does have a hematoma on the back of her head but no midline C-spine tenderness and her neurologic exam is nonfocal.  She is intermittently confused and says things that do not make sense this is not normal for her.  Patient's family member is concerned because she lives alone does not feel that she will be safe to be discharged.  Plan to obtain altered mental status work-up labs and CT head and C-spine.  Patient CT imaging does not reveal any traumatic injury.  Labs overall are at her baseline renal function is stable.  Her LFTs are rising however AST and ALT around 100 alk phos around 800 which is more elevated than  before.  It is felt that this was likely due to her fatty liver.  Patient does not have abdominal pain.  Of note in the ED patient was unable to urinate had about 900 cc in her bladder.  She was in  and out cath.  UA does not suggest UTI.  UDS positive for benzos.  Did not place Foley but patient will need to be monitored for recurrent retention.  Has not had issues with retention in the past.  Given patient is not at her baseline and family is not comfortable with her going home and she lives alone I have discussed with the hospitalist who will admit the patient.      FINAL CLINICAL IMPRESSION(S) / ED DIAGNOSES   Final diagnoses:  Altered mental status, unspecified altered mental status type     Rx / DC Orders   ED Discharge Orders     None        Note:  This document was prepared using Dragon voice recognition software and may include unintentional dictation errors.   Rada Hay, MD 11/01/22 [phone]

## 2022-11-01 NOTE — ED Notes (Signed)
Bladder scanned 3x 984m, 5691m & 80138m

## 2022-11-01 NOTE — ED Notes (Signed)
Pt placed on pur wick

## 2022-11-01 NOTE — ED Triage Notes (Signed)
Pt sts that she tripped over her own feet and fell and hit her head on the coffee table. Pt denies any LOC. Pt does say that she is on blood thinners and was told by her PCP to come and get a CT to make sure she does not have a bleed.

## 2022-11-01 NOTE — H&P (Signed)
History and Physical    Donna Evans:[id number] DOB: 17-Oct-1946 DOA: 11/01/2022  I have briefly reviewed the patient's prior medical records in [place]  PCP: Jodi Marble, MD  Patient coming from: home  Chief Complaint: Syncope, fall, slurred speech  HPI: Donna Evans is a 76 y.o. female with medical history significant of paroxysmal A-fib, HTN, hyperlipidemia, hypertension, comes to the hospital with complaints of syncopal episode and a fall, followed by confusion and slurred speech.  Patient was recently hospitalized for altered mental status 10/06/2022-10/18/2022, underwent extensive work-up and neurology was consulted at that time, without a clear-cut cause for encephalopathy.  Felt to have a degree of dementia.  SNF was recommended however insurance declined coverage, and ended up going home.  Family has been working to help her at home, but still lives by herself.  Since discharge she had an episode on 11/17 in which she completely passed out, but fortunately she was sitting on the couch and did not have any falls.  She came to the ED, was evaluated and discharged home.  Today, it is unclear what happened this patient was by herself, however she fell and hit her head against a table.  She is not sure whether she lost consciousness.  Knees came, and found the patient to be very confused, and had slurred speech.  Patient denies any recent fever, chills, chest pain, palpitations and otherwise she was in her normal state of health prior to this happening.  Niece is at bedside and provides most of the story, as patient alternates between clarity and confusing days / past events  ED Course: She is afebrile in the ED, normotensive, heart rate around 100-110.  She is satting well on room air.  Blood work reveals creatinine of 1.2, mild LFT elevation with AST and ALT around 100, normal CBC.  Urinalysis is unremarkable.  UDS positive for benzo and tricyclic's.  CT of the head was  without acute intracranial abnormalities.  She does have a high left posterior scalp contusion.  She was found to have urinary retention in the ED requiring an in and out cath.  Review of Systems: All systems reviewed, and apart from HPI, all negative  Past Medical History:  Diagnosis Date   Allergic state    Anginal pain ([place])    Prinzmetal's angina   Anxiety    Arthritis    osteoarthritis   Breast cancer ([place]) 1990   right breast cancer   Chronic pain    Chronic pain    COPD (chronic obstructive pulmonary disease) (HCC)    Depression    Edema    Fibromyalgia    Foot fracture    Bilateral   GERD (gastroesophageal reflux disease)    History of kidney stones    Hypertension    Leg fracture, right    Low back pain    Lumbosacral neuritis    Medulloadrenal hyperfunction (HCC)    Migraine headache    Peripheral neuropathy    Shoulder fracture, right    Stroke [facility])    TIA   Systemic lupus erythematosus ([place])    Thyroid disease    TIA (transient ischemic attack) 11/28/2013   Transient global amnesia 2011    Past Surgical History:  Procedure Laterality Date   ABDOMINAL HYSTERECTOMY  1981   APPENDECTOMY  1957   AUGMENTATION MAMMAPLASTY Bilateral 1990   saline submuscular   BACK SURGERY  1989   BILATERAL TOTAL MASTECTOMY WITH AXILLARY LYMPH NODE DISSECTION  1990   BREAST IMPLANT EXCHANGE Bilateral 05/11/2016   Procedure: REMOVAL AND REPLACEMENT OF BREAST IMPLANTS ;  Surgeon: Irene Limbo, MD;  Location: [place];  Service: Plastics;  Laterality: Bilateral;   CAPSULECTOMY Bilateral 05/11/2016   Procedure: CAPSULECTOMY CAPSULORRAPHY ;  Surgeon: Irene Limbo, MD;  Location: [place];  Service: Plastics;  Laterality: Bilateral;   CATARACT EXTRACTION W/PHACO Right 12/26/2018   Procedure: CATARACT EXTRACTION PHACO AND INTRAOCULAR LENS PLACEMENT ([place]) RIGHT;  Surgeon: Birder Robson, MD;  Location: [facility];  Service: Ophthalmology;   Laterality: Right;  [place]  00:46 CDE 8.50 Fluid pack lot # [id number]   CATARACT EXTRACTION W/PHACO Left 01/23/2019   Procedure: CATARACT EXTRACTION PHACO AND INTRAOCULAR LENS PLACEMENT ([place]) LEFT;  Surgeon: Birder Robson, MD;  Location: [facility];  Service: Ophthalmology;  Laterality: Left;  [place]  00:45 CDE  8.41 fluid pack lot # [id number]   CHOLECYSTECTOMY  1979   COLONOSCOPY WITH PROPOFOL N/A 01/25/2018   Procedure: COLONOSCOPY WITH PROPOFOL;  Surgeon: Manya Silvas, MD;  Location[facility];  Service: Endoscopy;  Laterality: N/A;   COPD     ESOPHAGOGASTRODUODENOSCOPY (EGD) WITH PROPOFOL N/A 03/07/2018   Procedure: ESOPHAGOGASTRODUODENOSCOPY (EGD) WITH PROPOFOL;  Surgeon: Manya Silvas, MD;  Location: [facility] ENDOSCOPY;  Service: Endoscopy;  Laterality: N/A;   FRACTURE SURGERY     HARDWARE REMOVAL Left 06/18/2020   Procedure: HARDWARE REMOVAL;  Surgeon: Lovell Sheehan, MD;  Location: [facility];  Service: Orthopedics;  Laterality: Left;   HUMERUS IM NAIL Left 12/31/2019   Procedure: INTRAMEDULLARY (IM) NAIL HUMERAL;  Surgeon: Lovell Sheehan, MD;  Location: [facility];  Service: Orthopedics;  Laterality: Left;   LIPOSUCTION Right 05/11/2016   Procedure: LIPOSUCTION;  Surgeon: Irene Limbo, MD;  Location: [place];  Service: Plastics;  Laterality: Right;   LIVER BIOPSY  2011   MASTECTOMY SUBCUTANEOUS Bilateral 1990   MASTOPEXY Bilateral 05/11/2016   Procedure: MASTOPEXY BILATERAL ;  Surgeon: Irene Limbo, MD;  Location: [place];  Service: Plastics;  Laterality: Bilateral;     reports that she quit smoking about 17 years ago. Her smoking use included cigarettes. She has a 40.00 pack-year smoking history. She has never used smokeless tobacco. She reports that she does not currently use alcohol after a past usage of about 2.0 standard drinks of alcohol per week. She reports that she does not use drugs.  Allergies  Allergen Reactions   Ciprofloxacin  Nausea And Vomiting and Hives   Codeine Anaphylaxis and Hives    Reaction:  Unknown    Demerol [Meperidine] Anaphylaxis and Hives    Reaction:  Unknown     Fluconazole Hives   Latex Anaphylaxis and Rash   Morphine And Related Hives   Doxycycline Nausea And Vomiting   Flagyl [Metronidazole] Nausea And Vomiting    Reaction:  Unknown    Influenza Vaccines Swelling    Localized swelling   Tape Other (See Comments)    Pt allergic to Adhesive tape and latex.(  Skin breaks out)   Valtrex [Valacyclovir Hcl] Hives   Buprenorphine     Other reaction(s): Other (see comments) unknown   Duloxetine Hcl Other (See Comments)    Reaction:  Unknown    Tramadol     Other reaction(s): Not available   Amoxicillin-Pot Clavulanate Nausea And Vomiting   Sulfamethoxazole-Trimethoprim Nausea And Vomiting   Valacyclovir Rash    Family History  Problem Relation Age of Onset   Atrial fibrillation Mother  Atrial fibrillation Sister    Cancer Sister    Diabetes Sister    Breast cancer Sister 63   Diabetes Father    Cancer Sister    Diabetes Sister    Atrial fibrillation Sister    Kidney disease Maternal Aunt     Prior to Admission medications   Medication Sig Start Date End Date Taking? Authorizing Provider  apixaban (ELIQUIS) 5 MG TABS tablet Take 1 tablet (5 mg total) by mouth 2 (two) times daily. 10/18/22  Yes Emeterio Reeve, DO  Cyanocobalamin (VITAMIN B-12 IJ) Inject 1,000 mg as directed every 30 (thirty) days.   Yes [provider]  cyclobenzaprine (FLEXERIL) 10 MG tablet Take 10 mg by mouth 3 (three) times daily. 07/23/22  Yes [provider]  folic acid (FOLVITE) 1 MG tablet Take 1 tablet (1 mg total) by mouth daily. 10/18/22  Yes Emeterio Reeve, DO  metoprolol tartrate (LOPRESSOR) 50 MG tablet Take 1 tablet (50 mg total) by mouth 2 (two) times daily. 10/18/22  Yes Emeterio Reeve, DO  rosuvastatin (CRESTOR) 10 MG tablet Take 10 mg by mouth daily. 02/24/22  Yes  [provider]  Vitamin D, Ergocalciferol, 50000 units CAPS Take 1 capsule by mouth once a week. 07/23/22  Yes [provider]  zolpidem (AMBIEN) 10 MG tablet Take 0.5 tablets (5 mg total) by mouth at bedtime. 10/18/22  Yes Emeterio Reeve, DO  ALPRAZolam Duanne Moron) 1 MG tablet Take 2 tablets (2 mg total) by mouth 2 (two) times daily as needed for anxiety. 10/18/22   Emeterio Reeve, DO  alum & mag hydroxide-simeth (MAALOX/MYLANTA) 200-200-20 MG/5ML suspension Take 15 mLs by mouth every 6 (six) hours as needed for indigestion or heartburn.    [provider]  butorphanol (STADOL) 10 MG/ML nasal spray Place 1 spray into the nose every 4 (four) hours as needed for headache. Patient not taking: Reported on 11/01/2022 10/22/16   Kathrine Haddock, NP  calcium carbonate (TUMS - DOSED IN MG ELEMENTAL CALCIUM) 500 MG chewable tablet Chew 1 tablet by mouth 3 (three) times daily as needed for indigestion or heartburn.    [provider]  torsemide (DEMADEX) 20 MG tablet Take 20 mg by mouth daily as needed. Patient not taking: Reported on 10/06/2022    [provider]    Physical Exam: Vitals:   11/01/22 1358 11/01/22 1827 11/01/22 1916 11/01/22 2106  BP:   109/62 110/73  Pulse:   (!) 55 (!) 115  Resp:   16 16  Temp:   97.7 F (36.5 C)   TempSrc:   Oral   SpO2:   99% 99%  Weight: 52.2 kg 52 kg    Height:  '5\' 5"'$  (1.651 m)      Constitutional: NAD, calm, comfortable Eyes: PERRL, lids and conjunctivae normal ENMT: Mucous membranes are moist. Posterior pharynx clear of any exudate or lesions.Normal dentition.  Neck: normal, supple Respiratory: clear to auscultation bilaterally, no wheezing, no crackles. Normal respiratory effort. No accessory muscle use.  Cardiovascular: Regular rate and rhythm, no murmurs / rubs / gallops.  Abdomen: no tenderness, no masses palpated. Bowel sounds positive.  Musculoskeletal: no clubbing / cyanosis. Normal muscle tone.   Skin: no rashes, lesions, ulcers. No induration Neurologic: CN 2-12 grossly intact. Strength 5/5 in all 4.   Labs on Admission: I have personally reviewed following labs and imaging studies  CBC: Recent Labs  Lab 10/29/22 1455 11/01/22 1402  WBC 7.9 9.1  NEUTROABS 4.8 6.7  HGB 11.2* 12.5  HCT 35.0* 38.8  MCV 94.6 95.6  PLT 171 220   Basic Metabolic Panel: Recent Labs  Lab 10/29/22 1455 11/01/22 1402  NA 143 144  K 4.4 4.0  CL 114* 116*  CO2 22 20*  GLUCOSE 118* 121*  BUN 54* 44*  CREATININE 1.42* 1.22*  CALCIUM 8.9 9.8   Liver Function Tests: Recent Labs  Lab 11/01/22 1402  AST 104*  ALT 111*  ALKPHOS 825*  BILITOT 0.9  PROT 7.3  ALBUMIN 3.8   Coagulation Profile: Recent Labs  Lab 11/01/22 1633  INR 1.8*   BNP (last 3 results) No results for input(s): "PROBNP" in the last 8760 hours. CBG: Recent Labs  Lab 10/29/22 1517  GLUCAP 82   Thyroid Function Tests: No results for input(s): "TSH", "T4TOTAL", "FREET4", "T3FREE", "THYROIDAB" in the last 72 hours. Urine analysis:    Component Value Date/Time   COLORURINE YELLOW (A) 11/01/2022 2106   APPEARANCEUR CLEAR (A) 11/01/2022 2106   LABSPEC 1.011 11/01/2022 2106   PHURINE 5.0 11/01/2022 2106   GLUCOSEU NEGATIVE 11/01/2022 2106   HGBUR NEGATIVE 11/01/2022 2106   BILIRUBINUR NEGATIVE 11/01/2022 2106   BILIRUBINUR neg 11/10/2015 1352   KETONESUR NEGATIVE 11/01/2022 2106   PROTEINUR NEGATIVE 11/01/2022 2106   UROBILINOGEN negative 11/10/2015 1352   UROBILINOGEN 0.2 11/20/2013 0201   NITRITE NEGATIVE 11/01/2022 2106   LEUKOCYTESUR NEGATIVE 11/01/2022 2106     Radiological Exams on Admission: CT HEAD WO CONTRAST (5MM)  Result Date: 11/01/2022 CLINICAL DATA:  Head trauma, minor (Age >= 65y) Pt on Eliquis; Neck trauma (Age >= 65y) EXAM: CT HEAD WITHOUT CONTRAST CT CERVICAL SPINE WITHOUT CONTRAST TECHNIQUE: Multidetector CT imaging of the head and cervical spine was performed following the standard  protocol without intravenous contrast. Multiplanar CT image reconstructions of the cervical spine were also generated. RADIATION DOSE REDUCTION: This exam was performed according to the departmental dose-optimization program which includes automated exposure control, adjustment of the mA and/or kV according to patient size and/or use of iterative reconstruction technique. COMPARISON:  None Available. FINDINGS: CT HEAD FINDINGS Brain: No evidence of acute infarction, hemorrhage, hydrocephalus, extra-axial collection or mass lesion/mass effect. Patchy white matter hypodensities, compatible with chronic microvascular ischemic disease. Vascular: No hyperdense vessel. Skull: No acute fracture.  High left posterior scalp contusion. Sinuses/Orbits: Frothy secretions in the right sphenoid and maxillary sinuses. Other: No mastoid effusions. CT CERVICAL SPINE FINDINGS Alignment: Normal. Skull base and vertebrae: No evidence of acute fracture vertebral body heights are maintained. Soft tissues and spinal canal: No prevertebral fluid or swelling. No visible canal hematoma. Disc levels: Moderate multilevel degenerative disc disease. Multilevel facet and uncovertebral hypertrophy with varying degrees of neural foraminal stenosis. Upper chest: Biapical pleuroparenchymal scarring.  Emphysema. IMPRESSION: 1. No evidence of acute intracranial abnormality. High left posterior scalp contusion. 2. No evidence of acute fracture or traumatic malalignment in the cervical spine. Multilevel degenerative change. 3.  Emphysema (ICD10-J43.9). Electronically Signed   By: Margaretha Sheffield M.D.   On: 11/01/2022 15:40   CT Cervical Spine Wo Contrast  Result Date: 11/01/2022 CLINICAL DATA:  Head trauma, minor (Age >= 65y) Pt on Eliquis; Neck trauma (Age >= 65y) EXAM: CT HEAD WITHOUT CONTRAST CT CERVICAL SPINE WITHOUT CONTRAST TECHNIQUE: Multidetector CT imaging of the head and cervical spine was performed following the standard protocol  without intravenous contrast. Multiplanar CT image reconstructions of the cervical spine were also generated. RADIATION DOSE REDUCTION: This exam was performed according to the departmental dose-optimization program which includes automated exposure control,  adjustment of the mA and/or kV according to patient size and/or use of iterative reconstruction technique. COMPARISON:  None Available. FINDINGS: CT HEAD FINDINGS Brain: No evidence of acute infarction, hemorrhage, hydrocephalus, extra-axial collection or mass lesion/mass effect. Patchy white matter hypodensities, compatible with chronic microvascular ischemic disease. Vascular: No hyperdense vessel. Skull: No acute fracture.  High left posterior scalp contusion. Sinuses/Orbits: Frothy secretions in the right sphenoid and maxillary sinuses. Other: No mastoid effusions. CT CERVICAL SPINE FINDINGS Alignment: Normal. Skull base and vertebrae: No evidence of acute fracture vertebral body heights are maintained. Soft tissues and spinal canal: No prevertebral fluid or swelling. No visible canal hematoma. Disc levels: Moderate multilevel degenerative disc disease. Multilevel facet and uncovertebral hypertrophy with varying degrees of neural foraminal stenosis. Upper chest: Biapical pleuroparenchymal scarring.  Emphysema. IMPRESSION: 1. No evidence of acute intracranial abnormality. High left posterior scalp contusion. 2. No evidence of acute fracture or traumatic malalignment in the cervical spine. Multilevel degenerative change. 3.  Emphysema (ICD10-J43.9). Electronically Signed   By: Margaretha Sheffield M.D.   On: 11/01/2022 15:40    EKG: Independently reviewed.  A-fib with rates around 110  Assessment/Plan Principal problem Syncope, fall-unclear what happened, patient reports that she tripped and fell but she is not sure whether she lost consciousness or not.  Recently admitted for the same, underwent a 2D echo ordered 10/14/1932 and EF of 55-60%, no WMA.  RV was  normal.  She has A-fib and is not clear whether this played a role.  Monitor on telemetry.  No need to repeat echo -Due to slurred speech following the episode, obtain MRI of the brain  Active problems Confusion, memory problems-somewhat acute on chronic, she alternates between fully oriented and confabulating when she does not remember certain things/actions.  Even now in the ED initially she told me she fell against a table and then later on she said she fell outside.  Neurology evaluated patient during her prior hospital stay, and felt to have a degree of dementia.  Work-up including B1, MRI were fairly unremarkable -Unfortunately she was declined for SNF, but it is clear that home environment is not safe given recent ED trip and now being hospitalized again.  PT consult  PAF -resume home medications, Eliquis, metoprolol  CKD 3A -baseline creatinine 1.2-1.3, currently at baseline  Acute urinary retention -no clear cause, urinalysis unremarkable.  Possibly due to fall/immobility.  Received in and out cath x1.  Continue to monitor  Elevated LFTs-possibly due to fatty liver disease, right upper quadrant ultrasound 10/26 showed hepatic steatosis.  Monitor   DVT prophylaxis: Eliquis  Code Status: Full code  Family Communication: niece at bedside Disposition Plan: needs SNF Bed Type: medical telemetry  Consults called: none  Obs/Inp: obs  Marzetta Board, MD, PhD [facility]  Contact via [email or web address]  11/01/2022, 10:47 PM

## 2022-11-01 NOTE — ED Provider Triage Note (Signed)
Emergency Medicine Provider Triage Evaluation Note  Donna Evans , a 76 y.o. female  was evaluated in triage.  Pt complains of fall, unwitnessed, head neck injury, weakness.  Review of Systems  Positive:  Negative:   Physical Exam  BP (!) 129/97 (BP Location: Right Arm)   Pulse (!) 45   Temp (!) 97.5 F (36.4 C) (Oral)   Resp 10   SpO2 94%  Gen:   Awake, no distress   Resp:  Normal effort  MSK:   Moves extremities without difficulty  Other:    Medical Decision Making  Medically screening exam initiated at 1:55 PM.  Appropriate orders placed.  Donna Evans was informed that the remainder of the evaluation will be completed by another provider, this initial triage assessment does not replace that evaluation, and the importance of remaining in the ED until their evaluation is complete.  Patient is tender at posterior to lateral C-spine around the 2-3, have patient placed in a c-collar, will do CT of the head and cervical spine to evaluate for neck fracture   Donna Starks, PA-C 11/01/22 1357

## 2022-11-02 DIAGNOSIS — N1831 Chronic kidney disease, stage 3a: Secondary | ICD-10-CM

## 2022-11-02 DIAGNOSIS — I480 Paroxysmal atrial fibrillation: Secondary | ICD-10-CM | POA: Diagnosis not present

## 2022-11-02 DIAGNOSIS — R55 Syncope and collapse: Secondary | ICD-10-CM | POA: Diagnosis not present

## 2022-11-02 DIAGNOSIS — N183 Chronic kidney disease, stage 3 unspecified: Secondary | ICD-10-CM | POA: Insufficient documentation

## 2022-11-02 DIAGNOSIS — E872 Acidosis, unspecified: Secondary | ICD-10-CM | POA: Insufficient documentation

## 2022-11-02 DIAGNOSIS — K7581 Nonalcoholic steatohepatitis (NASH): Secondary | ICD-10-CM | POA: Insufficient documentation

## 2022-11-02 LAB — COMPREHENSIVE METABOLIC PANEL
ALT: 78 U/L — ABNORMAL HIGH (ref 0–44)
AST: 80 U/L — ABNORMAL HIGH (ref 15–41)
Albumin: 3.1 g/dL — ABNORMAL LOW (ref 3.5–5.0)
Alkaline Phosphatase: 765 U/L — ABNORMAL HIGH (ref 38–126)
Anion gap: 5 (ref 5–15)
BUN: 41 mg/dL — ABNORMAL HIGH (ref 8–23)
CO2: 21 mmol/L — ABNORMAL LOW (ref 22–32)
Calcium: 9.3 mg/dL (ref 8.9–10.3)
Chloride: 118 mmol/L — ABNORMAL HIGH (ref 98–111)
Creatinine, Ser: 1.23 mg/dL — ABNORMAL HIGH (ref 0.44–1.00)
GFR, Estimated: 46 mL/min — ABNORMAL LOW (ref >60)
Glucose, Bld: 94 mg/dL (ref 70–99)
Potassium: 4.1 mmol/L (ref 3.5–5.1)
Sodium: 144 mmol/L (ref 135–145)
Total Bilirubin: 1.2 mg/dL (ref 0.3–1.2)
Total Protein: 6.1 g/dL — ABNORMAL LOW (ref 6.5–8.1)

## 2022-11-02 LAB — CBC
HCT: 33.3 % — ABNORMAL LOW (ref 36.0–46.0)
Hemoglobin: 10.7 g/dL — ABNORMAL LOW (ref 12.0–15.0)
MCH: 30.1 pg (ref 26.0–34.0)
MCHC: 32.1 g/dL (ref 30.0–36.0)
MCV: 93.8 fL (ref 80.0–100.0)
Platelets: 160 10*3/uL (ref 150–400)
RBC: 3.55 MIL/uL — ABNORMAL LOW (ref 3.87–5.11)
RDW: 14.2 % (ref 11.5–15.5)
WBC: 6.3 10*3/uL (ref 4.0–10.5)
nRBC: 0 % (ref 0.0–0.2)

## 2022-11-02 LAB — MAGNESIUM: Magnesium: 2.1 mg/dL (ref 1.7–2.4)

## 2022-11-02 MED ORDER — METOPROLOL TARTRATE 25 MG PO TABS
50.0000 mg | ORAL_TABLET | Freq: Two times a day (BID) | ORAL | Status: DC
  Administered 2022-11-02 (×2): 50 mg via ORAL
  Filled 2022-11-02 (×2): qty 2

## 2022-11-02 MED ORDER — ONDANSETRON HCL 4 MG PO TABS: 4.0000 mg | ORAL_TABLET | Freq: Four times a day (QID) | ORAL | Status: DC | PRN

## 2022-11-02 MED ORDER — SODIUM BICARBONATE 650 MG PO TABS: 650.0000 mg | ORAL_TABLET | Freq: Three times a day (TID) | ORAL | 0 refills | Status: AC

## 2022-11-02 MED ORDER — ALPRAZOLAM 0.25 MG PO TABS: 0.2500 mg | ORAL_TABLET | Freq: Two times a day (BID) | ORAL | Status: DC | PRN

## 2022-11-02 MED ORDER — APIXABAN 5 MG PO TABS
5.0000 mg | ORAL_TABLET | Freq: Two times a day (BID) | ORAL | Status: DC
  Administered 2022-11-02 (×2): 5 mg via ORAL
  Filled 2022-11-02 (×2): qty 1

## 2022-11-02 MED ORDER — ALUM & MAG HYDROXIDE-SIMETH 200-200-20 MG/5ML PO SUSP: 15.0000 mL | Freq: Four times a day (QID) | ORAL | Status: DC | PRN

## 2022-11-02 MED ORDER — CALCIUM CARBONATE ANTACID 500 MG PO CHEW: 1.0000 | CHEWABLE_TABLET | Freq: Three times a day (TID) | ORAL | Status: DC | PRN

## 2022-11-02 MED ORDER — ACETAMINOPHEN 650 MG RE SUPP: 650.0000 mg | Freq: Four times a day (QID) | RECTAL | Status: DC | PRN

## 2022-11-02 MED ORDER — VITAMIN D (ERGOCALCIFEROL) 1.25 MG (50000 UNIT) PO CAPS: 50000.0000 [IU] | ORAL_CAPSULE | ORAL | Status: DC

## 2022-11-02 MED ORDER — ONDANSETRON HCL 4 MG/2ML IJ SOLN: 4.0000 mg | Freq: Four times a day (QID) | INTRAMUSCULAR | Status: DC | PRN

## 2022-11-02 MED ORDER — DIGOXIN 125 MCG PO TABS: 125.0000 ug | ORAL_TABLET | Freq: Every day | ORAL | 0 refills | Status: DC

## 2022-11-02 MED ORDER — ACETAMINOPHEN 325 MG PO TABS
650.0000 mg | ORAL_TABLET | Freq: Four times a day (QID) | ORAL | Status: DC | PRN
  Administered 2022-11-02 (×2): 650 mg via ORAL
  Filled 2022-11-02 (×2): qty 2

## 2022-11-02 MED ORDER — SODIUM BICARBONATE 650 MG PO TABS
650.0000 mg | ORAL_TABLET | Freq: Three times a day (TID) | ORAL | Status: DC
  Administered 2022-11-02: 650 mg via ORAL
  Filled 2022-11-02 (×2): qty 1

## 2022-11-02 MED ORDER — CYCLOBENZAPRINE HCL 10 MG PO TABS
10.0000 mg | ORAL_TABLET | Freq: Three times a day (TID) | ORAL | Status: DC
  Administered 2022-11-02: 10 mg via ORAL
  Filled 2022-11-02: qty 1

## 2022-11-02 MED ORDER — ALPRAZOLAM 0.5 MG PO TABS
1.0000 mg | ORAL_TABLET | Freq: Two times a day (BID) | ORAL | Status: DC | PRN
  Administered 2022-11-02: 1 mg via ORAL
  Filled 2022-11-02: qty 2

## 2022-11-02 MED ORDER — ROSUVASTATIN CALCIUM 20 MG PO TABS
10.0000 mg | ORAL_TABLET | Freq: Every day | ORAL | Status: DC
  Administered 2022-11-02: 10 mg via ORAL
  Filled 2022-11-02: qty 1

## 2022-11-02 MED ORDER — DIGOXIN 0.25 MG/ML IJ SOLN
0.2500 mg | Freq: Once | INTRAMUSCULAR | Status: AC
  Administered 2022-11-02: 0.25 mg via INTRAVENOUS
  Filled 2022-11-02: qty 2

## 2022-11-02 NOTE — Evaluation (Signed)
Physical Therapy Evaluation Patient Details Name: Donna Evans MRN: [id number] DOB: July 12, 1946 Today's Date: 11/02/2022  History of Present Illness  Pt is a 76 y.o. female s/p unwitnessed fall; confusion and difficulty understanding pt's speech noted.  Per chart pt has been having trouble with balance and frequent falls lately.  Pt admitted with acute encephalopathy, ground level fall, B LE lymphedema, and elevated LFT's.  Multiple chronic R rib fx's noted on imaging. transferred to higher level of care due to new afib with RVR.  PMH includes htn, prior CVA (unknown deficits), GERD, breast CA s/p mastectomy, chronic pain syndrome, CKD stage IIIa, COPD, SLE, depression with anxiety, migraine headache, fibromyalgia, B foot fx, peripheral neuropathy, L humeral IM nail 12/31/2019.  Clinical Impression  Pt is a pleasant 76 year old female who was admitted for syncope and has increased falls at home. Pt reports she only falls front ways or backwards. Pt performs bed mobility/transfers with mod I and ambulation with cga and RW. HR elevates to 143bpm with exertion. Pt demonstrates deficits with strength/mobility/balance. Negative orthostatics at this time. Would benefit from skilled PT to address above deficits and promote optimal return to PLOF. Recommend transition to [place] upon discharge from acute hospitalization. Orthostatic VS for the past 24 hrs:  BP- Lying Pulse- Lying BP- Sitting Pulse- Sitting BP- Standing at 0 minutes Pulse- Standing at 0 minutes  11/02/22 0900 111/60 118 (!) 117/92 115 129/78 144          Recommendations for follow up therapy are one component of a multi-disciplinary discharge planning process, led by the attending physician.  Recommendations may be updated based on patient status, additional functional criteria and insurance authorization.  Follow Up Recommendations Home health PT Can patient physically be transported by private vehicle: Yes    Assistance Recommended at  Discharge Intermittent Supervision/Assistance  Patient can return home with the following  A little help with walking and/or transfers;A little help with bathing/dressing/bathroom;Assistance with cooking/housework;Help with stairs or ramp for entrance;Assist for transportation    Equipment Recommendations Rollator (4 wheels) (current rollator broken)  Recommendations for Other Services       Functional Status Assessment Patient has had a recent decline in their functional status and demonstrates the ability to make significant improvements in function in a reasonable and predictable amount of time.     Precautions / Restrictions Precautions Precautions: Fall Restrictions Weight Bearing Restrictions: No      Mobility  Bed Mobility Overal bed mobility: Modified Independent Bed Mobility: Supine to Sit, Sit to Supine     Supine to sit: Modified independent (Device/Increase time)     General bed mobility comments: safe technique with upright posture. Once seated, no dizziness noted. Orthostatics obtained    Transfers Overall transfer level: Modified independent Equipment used: Rolling walker (2 wheels) Transfers: Sit to/from Stand Sit to Stand: Modified independent (Device/Increase time)           General transfer comment: safe technique with 1 cue for pushing from seated surface. RW used    Ambulation/Gait Ambulation/Gait assistance: Counsellor (Feet): 200 Feet Assistive device: Rolling walker (2 wheels) Gait Pattern/deviations: Step-through pattern       General Gait Details: ambulated with reciprocal gait pattern in hallway with RW. Often veers to L with cues for correction. High risk for falls during turns with crossover gait. Cues to stay close to RW. HR increases to 143bpm with exertion. O2 sats WNL on RA  Stairs  Wheelchair Mobility    Modified Rankin (Stroke Patients Only)       Balance Overall balance assessment: Needs  assistance Sitting-balance support: No upper extremity supported, Feet supported Sitting balance-Leahy Scale: Good     Standing balance support: Bilateral upper extremity supported, During functional activity Standing balance-Leahy Scale: Fair                               Pertinent Vitals/Pain Pain Assessment Pain Assessment: No/denies pain    Home Living Family/patient expects to be discharged to:: Private residence Living Arrangements: Alone Available Help at Discharge: Family;Available PRN/intermittently;Friend(s) Type of Home: House Home Access: Stairs to enter Entrance Stairs-Rails: None Entrance Stairs-Number of Steps: 1 (threshold level)   Home Layout: One level Home Equipment: Shower seat;Grab bars - toilet;Rollator (4 wheels);Cane - single point;Rolling Walker (2 wheels) Additional Comments: reports she has private paid caregiver on wednesdays and her niece can stay during the day. Just recently got life alert    Prior Function Prior Level of Function : Needs assist;History of Falls (last six months)             Mobility Comments: uses rollator all the time now ADLs Comments: mod I     Hand Dominance        Extremity/Trunk Assessment   Upper Extremity Assessment Upper Extremity Assessment: Overall WFL for tasks assessed    Lower Extremity Assessment Lower Extremity Assessment: Overall WFL for tasks assessed       Communication   Communication: No difficulties  Cognition Arousal/Alertness: Awake/alert Behavior During Therapy: WFL for tasks assessed/performed Overall Cognitive Status: No family/caregiver present to determine baseline cognitive functioning                                          General Comments      Exercises Other Exercises Other Exercises: ambulated to [facility] with supervision and safe technique. Able to perform self hygiene with mod I.   Assessment/Plan    PT Assessment Patient needs continued  PT services  PT Problem List Decreased strength;Decreased activity tolerance;Decreased balance;Decreased mobility;Decreased knowledge of use of DME;Decreased safety awareness;Decreased knowledge of precautions;Pain       PT Treatment Interventions DME instruction;Gait training;Stair training;Functional mobility training;Therapeutic activities;Therapeutic exercise;Balance training;Patient/family education    PT Goals (Current goals can be found in the Care Plan section)  Acute Rehab PT Goals Patient Stated Goal: to improve mobility and balance PT Goal Formulation: With patient Time For Goal Achievement: 11/16/22 Potential to Achieve Goals: Good    Frequency Min 2X/week     Co-evaluation               AM-PAC PT "6 Clicks" Mobility  Outcome Measure Help needed turning from your back to your side while in a flat bed without using bedrails?: None Help needed moving from lying on your back to sitting on the side of a flat bed without using bedrails?: None Help needed moving to and from a bed to a chair (including a wheelchair)?: None Help needed standing up from a chair using your arms (e.g., wheelchair or bedside chair)?: None Help needed to walk in hospital room?: A Little Help needed climbing 3-5 steps with a railing? : A Little 6 Click Score: 22    End of Session Equipment Utilized During Treatment: Gait belt Activity Tolerance: Patient  tolerated treatment well Patient left: in bed (seated at EOB with RN present) Nurse Communication: Mobility status PT Visit Diagnosis: Other abnormalities of gait and mobility (R26.89);Muscle weakness (generalized) (M62.81);History of falling (Z91.81);Repeated falls (R29.6);Pain Pain - Right/Left:  (midline) Pain - part of body:  (head)    Time: 7949-9718 PT Time Calculation (min) (ACUTE ONLY): 26 min   Charges:   PT Evaluation $PT Eval Low Complexity: 1 Low PT Treatments $Gait Training: 8-22 mins        Greggory Stallion, PT, DPT,  GCS [phone]   Mariangela Heldt 11/02/2022, 10:48 AM

## 2022-11-02 NOTE — ED Notes (Signed)
Patient in atrial fibrillation with sustained rate 115-120's. Rate up to 130-140's, unsustained. Morton Amy messaged via secure chat.

## 2022-11-02 NOTE — TOC Initial Note (Signed)
Transition of Care [facility]) - Initial/Assessment Note    Patient Details  Name: Donna Evans MRN: [id number] Date of Birth: 1946-04-22  Transition of [facility]) CM/SW Contact:    Shelbie Hutching, RN Phone Number: 11/02/2022, 10:51 AM  Clinical Narrative:                 Patient placed under observation for syncopal episode, fall at home, now medically cleared for discharge back home with home health services.  Patient is open with [facility] for RN, PT and OT, will add SW.  RNCM spoke with patient's nephew, Ronalee Belts, he does have concerns for her going back home as he says she will just fall again and be right back.  Unfortunately she does not qualify for SNF, family has set up [facility] to be with patient on Wed, Thurs, and Friday but they have no one for today.  Ronalee Belts will look into getting someone to get the patient settled today, he is not sure what time someone will be able to come and pick the patient up, he will get back up with me when he knows more.   Expected Discharge Plan: [place] Barriers to Discharge: Barriers Resolved   Patient Goals and CMS Choice Patient states their goals for this hospitalization and ongoing recovery are:: nephew concerned with patient going home CMS Medicare.gov Compare Post Acute Care list provided to:: Patient Choice offered to / list presented to : Patient, [facility] POA / Guardian  Expected Discharge Plan and Services Expected Discharge Plan: [place]   Discharge Planning Services: CM Consult Post Acute Care Choice: [place] arrangements for the past 2 months: Single Family Home Expected Discharge Date: 11/02/22               DME Arranged: N/A DME Agency: NA       HH Arranged: RN, PT, OT, Social Work [employer] Agency: [place] Date HH Agency Contacted: 11/02/22 Time [place]: [id number] Representative spoke with at [place]: [place]  Prior Living Arrangements/Services Living  arrangements for the past 2 months: [place] with:: Self Patient language and need for interpreter reviewed:: Yes Do you feel safe going back to the place where you live?: Yes      Need for Family Participation in Patient Care: Yes (Comment) Care giver support system in place?: Yes (comment) Current home services: DME, Homehealth aide (walker, cane, 3 in 1) Criminal Activity/Legal Involvement Pertinent to Current Situation/Hospitalization: No - Comment as needed  Activities of Daily Living Home Assistive Devices/Equipment: Walker (specify type), Eyeglasses ADL Screening (condition at time of admission) Patient's cognitive ability adequate to safely complete daily activities?: Yes Is the patient deaf or have difficulty hearing?: No Does the patient have difficulty seeing, even when wearing glasses/contacts?: No Does the patient have difficulty concentrating, remembering, or making decisions?: Yes Patient able to express need for assistance with ADLs?: Yes Does the patient have difficulty dressing or bathing?: No Independently performs ADLs?: Yes (appropriate for developmental age) Does the patient have difficulty walking or climbing stairs?: Yes Weakness of Legs: None Weakness of Arms/Hands: None  Permission Sought/Granted      Share Information with NAME: Tomma Rakers  Permission granted to share info w AGENCY: [facility]  Permission granted to share info w Relationship: nephew  Permission granted to share info w Contact Information: [phone]  Emotional Assessment         Alcohol /  Substance Use: Not Applicable Psych Involvement: No (comment)  Admission diagnosis:  Syncope [R55] Patient Active Problem List   Diagnosis Date Noted   Chronic kidney disease, stage 3a ([place]) 40/97/3532   Metabolic acidosis 99/24/2683   Steatohepatitis, non-alcoholic 41/96/2229   Paroxysmal atrial fibrillation with RVR ([place]) 10/14/2022   Delirium due to another medical  condition 10/08/2022   Altered mental status    Bilateral leg edema    Community acquired pneumonia of left lower lobe of lung    Head injury    Ground-level fall 10/06/2022   Acute encephalopathy 10/06/2022   Lymphedema 10/06/2022   Elevated LFTs 10/06/2022   HLD (hyperlipidemia) 08/10/2022   Elevated alkaline phosphatase level 08/10/2022   Elevated troponin 04/28/2022   Stroke ([place]) 04/28/2022   Fall 04/28/2022   Acute renal failure superimposed on stage 3a chronic kidney disease ([place]) 04/28/2022   Chronic diastolic CHF (congestive heart failure) ([place]) 79/89/2119   Acute metabolic encephalopathy 41/74/0814   Venous insufficiency 03/31/2022   SOB (shortness of breath) on exertion 03/08/2022   Pedal edema 03/08/2022   Closed fracture of distal end of left radius 12/22/2021   Low back pain 12/17/2021   Fracture of pelvis ([place]) 11/13/2021   Bilateral leg weakness 05/13/2021   Memory change 03/04/2021   Imbalance 03/04/2021   Falls frequently 03/04/2021   Dizziness 03/04/2021   Senile osteoporosis 10/18/2019   Long term current use of opiate analgesic 07/04/2017   Long term prescription opiate use 07/04/2017   Opiate use 07/04/2017   Bursitis of shoulder 07/04/2017   Closed fracture of upper end of humerus 07/04/2017   Knee pain 07/04/2017   Localized, primary osteoarthritis 07/04/2017   Osteoarthritis of knee 07/04/2017   Pain in limb 07/04/2017   Sprain of wrist 07/04/2017   Pain in joint involving ankle and foot 07/04/2017   Impingement syndrome of left shoulder region 06/29/2017   Trochanteric bursitis 06/29/2017   AKI (acute kidney injury) ([place]) 10/01/2016   Syncope 09/30/2016   Migraine headache 08/10/2016   Controlled substance agreement signed 06/12/2016   Insomnia 06/12/2016   Hypertension, benign 02/04/2016   Easy bruisability 11/13/2015   Osteoporosis 11/13/2015   Fibromyalgia syndrome 11/13/2015   Generalized anxiety disorder 11/13/2015   Chronic pain  syndrome 11/13/2015   Overactive detrusor 09/11/2015   Adrenal adenoma 09/02/2015   Chronic cystitis 09/02/2015   Incomplete bladder emptying 48/18/5631   Renal colic 49/70/2637   Urinary tract infection 07/28/2015   Gross hematuria 07/28/2015   History of reconstruction of both breasts 06/18/2015   S/P cardiac catheterization 06/18/2015   Acquired absence of both breasts and nipples 11/20/2014   Transient cerebral ischemia 11/28/2013   Hemiplegia of dominant side ([place]) 11/20/2013   PCP:  Jodi Marble, MD Pharmacy:   [place], [place] [address] [place] [address] Phone: [phone] Fax: [phone]  [facility] [place] [facility], [place] [address] [place] [address] Phone: [phone] Fax: [phone]  [id number] [address]: [phone]: [address]: [phone]: [phone]    Social Determinants of Health (SDOH) Interventions    Readmission Risk Interventions     No data to display

## 2022-11-02 NOTE — Discharge Summary (Addendum)
Physician Discharge Summary   Patient: Donna Evans MRN: [id number] DOB: 02/07/1946  Admit date:     11/01/2022  Discharge date: 11/02/22  Discharge Physician: Sharen Hones   PCP: Jodi Marble, MD   Recommendations at discharge:   Follow-up with PCP in 1 week. Follow-up with home care. Do not take Xanax but can take Ambien for sleep at night.  Discharge Diagnoses: Principal Problem:   Syncope Active Problems:   Hypertension, benign   Paroxysmal atrial fibrillation with RVR (HCC)   Chronic kidney disease, stage 3a (HCC)   Metabolic acidosis   Steatohepatitis, non-alcoholic  Resolved Problems:   * No resolved hospital problems. [facility] Course: Donna Evans is a 76 y.o. female with medical history significant of paroxysmal A-fib, HTN, hyperlipidemia, hypertension, comes to the hospital with complaints of syncopal episode and a fall, followed by confusion and slurred speech. Patient was recently hospitalized for altered mental status 10/06/2022-10/18/2022, underwent extensive work-up and neurology was consulted at that time, without a clear-cut cause for encephalopathy.  Patient apparently had a frequent falls at home, sometimes she blacked out. Recent echocardiogram performed 10/14/2022 showed normal ejection fraction without valvular disease.  Telemetry did not show any arrhythmia. Had a long discussion with the patient, and reviewed her medication list.  High-dose benzodiazepine prescribed may contributed to frequent falls. At this point, patient has no longer has any confusion, she refused nursing home placement.  She wished to go home.  Discussed with patient niece, will discharge patient with set up of PT/OT/RN/aids.  Family will consider possibility of assisted living facility.  So far patient has refused it.   Assessment and Plan: Recurrent syncope with weakness. Confusion after the fall. Patient appears to have recurrent falls,with a loss of consciousness.   Condition is consistent with syncope.  Echocardiogram could not review any cause of syncope.  Patient is not having hypotension.  I also reviewed lab results over the past year, no recorded hypoglycemia. However, patient was taking Xanax 2 mg twice a day as needed in addition to Ambien at nighttime.  This could be contributing to her syncope episode.  I will discontinue Xanax, she can continue Ambien as needed at bedtime.  May also consider weaning off Ambien gradually and replaced with other medicine for sleep. I will also set up PT/OT/RN/aids.  Paroxysmal atrial fibrillation with tachycardia. Patient is still on Eliquis and metoprolol, will continue.  Patient received 0.25 mg IV digoxin, heart rate is better.  I will prescribe 7 days of digoxin at 0.125 mg daily.  May continue after seen by PCP if needed.  Chronic kidney stage IIIa. Mild metabolic acidosis. Urinary retention. Renal function stable, patient was able to urinate this morning, I asked RN to scan the bladder again to make sure she can empty her bladder.  UA does not support UTI.        Consultants: None Procedures performed: None  Disposition: Home health Diet recommendation:  Discharge Diet Orders (From admission, onward)     Start     Ordered   11/02/22 0000  Diet - low sodium heart healthy        11/02/22 1021           Cardiac diet DISCHARGE MEDICATION: Allergies as of 11/02/2022       Reactions   Ciprofloxacin Nausea And Vomiting, Hives   Codeine Anaphylaxis, Hives   Reaction:  Unknown    Demerol [meperidine] Anaphylaxis, Hives   Reaction:  Unknown  Fluconazole Hives   Latex Anaphylaxis, Rash   Morphine And Related Hives   Doxycycline Nausea And Vomiting   Flagyl [metronidazole] Nausea And Vomiting   Reaction:  Unknown    Influenza Vaccines Swelling   Localized swelling   Tape Other (See Comments)   Pt allergic to Adhesive tape and latex.(  Skin breaks out)   Valtrex [valacyclovir Hcl] Hives    Buprenorphine    Other reaction(s): Other (see comments) unknown   Duloxetine Hcl Other (See Comments)   Reaction:  Unknown    Tramadol    Other reaction(s): Not available   Amoxicillin-pot Clavulanate Nausea And Vomiting   Sulfamethoxazole-trimethoprim Nausea And Vomiting   Valacyclovir Rash        Medication List     STOP taking these medications    ALPRAZolam 1 MG tablet Commonly known as: XANAX   butorphanol 10 MG/ML nasal spray Commonly known as: STADOL   torsemide 20 MG tablet Commonly known as: DEMADEX       TAKE these medications    alum & mag hydroxide-simeth 200-200-20 MG/5ML suspension Commonly known as: MAALOX/MYLANTA Take 15 mLs by mouth every 6 (six) hours as needed for indigestion or heartburn.   apixaban 5 MG Tabs tablet Commonly known as: ELIQUIS Take 1 tablet (5 mg total) by mouth 2 (two) times daily.   calcium carbonate 500 MG chewable tablet Commonly known as: TUMS - dosed in mg elemental calcium Chew 1 tablet by mouth 3 (three) times daily as needed for indigestion or heartburn.   cyclobenzaprine 10 MG tablet Commonly known as: FLEXERIL Take 10 mg by mouth 3 (three) times daily.   folic acid 1 MG tablet Commonly known as: FOLVITE Take 1 tablet (1 mg total) by mouth daily.   metoprolol tartrate 50 MG tablet Commonly known as: LOPRESSOR Take 1 tablet (50 mg total) by mouth 2 (two) times daily.   rosuvastatin 10 MG tablet Commonly known as: CRESTOR Take 10 mg by mouth daily.   sodium bicarbonate 650 MG tablet Take 1 tablet (650 mg total) by mouth 3 (three) times daily for 3 days.   VITAMIN B-12 IJ Inject 1,000 mg as directed every 30 (thirty) days.   Vitamin D (Ergocalciferol) 50000 units Caps Take 1 capsule by mouth once a week.   zolpidem 10 MG tablet Commonly known as: AMBIEN Take 0.5 tablets (5 mg total) by mouth at bedtime.        Follow-up Information     Jodi Marble, MD Follow up in 1 week(s).   Specialty:  Internal Medicine Contact information: [place] [address] [phone]                Discharge Exam: Danley Danker Weights   11/01/22 1358 11/01/22 1827  Weight: 52.2 kg 52 kg   General exam: Appears calm and comfortable  Respiratory system: Clear to auscultation. Respiratory effort normal. Cardiovascular system: irregular. No JVD, murmurs, rubs, gallops or clicks. No pedal edema. Gastrointestinal system: Abdomen is nondistended, soft and nontender. No organomegaly or masses felt. Normal bowel sounds heard. Central nervous system: Alert and oriented x3. No focal neurological deficits. Extremities: Symmetric 5 x 5 power. Skin: No rashes, lesions or ulcers Psychiatry: Judgement and insight appear normal. Mood & affect appropriate.    Condition at discharge: fair  The results of significant diagnostics from this hospitalization (including imaging, microbiology, ancillary and laboratory) are listed below for reference.   Imaging Studies: MR BRAIN WO CONTRAST  Result Date: 11/02/2022 CLINICAL  DATA:  Fall, altered mental status EXAM: MRI HEAD WITHOUT CONTRAST TECHNIQUE: Multiplanar, multiecho pulse sequences of the brain and surrounding structures were obtained without intravenous contrast. COMPARISON:  10/06/2022 MRI head, correlation is also made with CT head 11/01/2022 FINDINGS: Brain: No restricted diffusion to suggest acute or subacute infarct. No acute hemorrhage, mass, mass effect, or midline shift. No hydrocephalus or extra-axial collection. Scattered and confluent T2 hyperintense signal in the periventricular white matter, likely the sequela of mild-to-moderate chronic small vessel ischemic disease. Vascular: Normal arterial flow voids. Skull and upper cervical spine: Normal marrow signal. Left parietal scalp hematoma. Sinuses/Orbits: Air-fluid levels in the right maxillary and sphenoid sinuses. Status post bilateral lens replacements. Other: Trace fluid in  bilateral mastoid tips. IMPRESSION: 1. No acute intracranial process. 2. Air-fluid levels in the right maxillary and sphenoid sinuses, which are nonspecific but can be seen in the setting of acute sinusitis. Electronically Signed   By: Merilyn Baba M.D.   On: 11/02/2022 00:23   CT HEAD WO CONTRAST (5MM)  Result Date: 11/01/2022 CLINICAL DATA:  Head trauma, minor (Age >= 65y) Pt on Eliquis; Neck trauma (Age >= 65y) EXAM: CT HEAD WITHOUT CONTRAST CT CERVICAL SPINE WITHOUT CONTRAST TECHNIQUE: Multidetector CT imaging of the head and cervical spine was performed following the standard protocol without intravenous contrast. Multiplanar CT image reconstructions of the cervical spine were also generated. RADIATION DOSE REDUCTION: This exam was performed according to the departmental dose-optimization program which includes automated exposure control, adjustment of the mA and/or kV according to patient size and/or use of iterative reconstruction technique. COMPARISON:  None Available. FINDINGS: CT HEAD FINDINGS Brain: No evidence of acute infarction, hemorrhage, hydrocephalus, extra-axial collection or mass lesion/mass effect. Patchy white matter hypodensities, compatible with chronic microvascular ischemic disease. Vascular: No hyperdense vessel. Skull: No acute fracture.  High left posterior scalp contusion. Sinuses/Orbits: Frothy secretions in the right sphenoid and maxillary sinuses. Other: No mastoid effusions. CT CERVICAL SPINE FINDINGS Alignment: Normal. Skull base and vertebrae: No evidence of acute fracture vertebral body heights are maintained. Soft tissues and spinal canal: No prevertebral fluid or swelling. No visible canal hematoma. Disc levels: Moderate multilevel degenerative disc disease. Multilevel facet and uncovertebral hypertrophy with varying degrees of neural foraminal stenosis. Upper chest: Biapical pleuroparenchymal scarring.  Emphysema. IMPRESSION: 1. No evidence of acute intracranial  abnormality. High left posterior scalp contusion. 2. No evidence of acute fracture or traumatic malalignment in the cervical spine. Multilevel degenerative change. 3.  Emphysema (ICD10-J43.9). Electronically Signed   By: Margaretha Sheffield M.D.   On: 11/01/2022 15:40   CT Cervical Spine Wo Contrast  Result Date: 11/01/2022 CLINICAL DATA:  Head trauma, minor (Age >= 65y) Pt on Eliquis; Neck trauma (Age >= 65y) EXAM: CT HEAD WITHOUT CONTRAST CT CERVICAL SPINE WITHOUT CONTRAST TECHNIQUE: Multidetector CT imaging of the head and cervical spine was performed following the standard protocol without intravenous contrast. Multiplanar CT image reconstructions of the cervical spine were also generated. RADIATION DOSE REDUCTION: This exam was performed according to the departmental dose-optimization program which includes automated exposure control, adjustment of the mA and/or kV according to patient size and/or use of iterative reconstruction technique. COMPARISON:  None Available. FINDINGS: CT HEAD FINDINGS Brain: No evidence of acute infarction, hemorrhage, hydrocephalus, extra-axial collection or mass lesion/mass effect. Patchy white matter hypodensities, compatible with chronic microvascular ischemic disease. Vascular: No hyperdense vessel. Skull: No acute fracture.  High left posterior scalp contusion. Sinuses/Orbits: Frothy secretions in the right sphenoid and maxillary sinuses. Other: No mastoid  effusions. CT CERVICAL SPINE FINDINGS Alignment: Normal. Skull base and vertebrae: No evidence of acute fracture vertebral body heights are maintained. Soft tissues and spinal canal: No prevertebral fluid or swelling. No visible canal hematoma. Disc levels: Moderate multilevel degenerative disc disease. Multilevel facet and uncovertebral hypertrophy with varying degrees of neural foraminal stenosis. Upper chest: Biapical pleuroparenchymal scarring.  Emphysema. IMPRESSION: 1. No evidence of acute intracranial abnormality.  High left posterior scalp contusion. 2. No evidence of acute fracture or traumatic malalignment in the cervical spine. Multilevel degenerative change. 3.  Emphysema (ICD10-J43.9). Electronically Signed   By: Margaretha Sheffield M.D.   On: 11/01/2022 15:40   DG Chest Portable 1 View  Result Date: 10/29/2022 CLINICAL DATA:  Syncope. EXAM: PORTABLE CHEST 1 VIEW COMPARISON:  CT of the chest 03/22/2014. chest x-ray 10/06/2022. FINDINGS: The heart size and mediastinal contours are within normal limits. Both lungs are clear. There are healed left eighth and ninth rib fractures which are new from prior study. Healed left humeral fracture again noted. IMPRESSION: No active disease. Electronically Signed   By: Ronney Asters M.D.   On: 10/29/2022 18:29   CT Head Wo Contrast  Result Date: 10/29/2022 CLINICAL DATA:  Syncopal episode lasting 10 seconds. EXAM: CT HEAD WITHOUT CONTRAST TECHNIQUE: Contiguous axial images were obtained from the base of the skull through the vertex without intravenous contrast. RADIATION DOSE REDUCTION: This exam was performed according to the departmental dose-optimization program which includes automated exposure control, adjustment of the mA and/or kV according to patient size and/or use of iterative reconstruction technique. COMPARISON:  CT head without contrast 10/07/2022. MR head without contrast 10/06/2022. FINDINGS: Brain: Moderate atrophy and white matter changes are stable. No acute infarct, hemorrhage, or mass lesion is present. The ventricles are proportionate to the degree of atrophy. No significant extraaxial fluid collection is present. Basal ganglia are intact. The brainstem and cerebellum are within normal limits. Vascular: No hyperdense vessel or unexpected calcification. Skull: Calvarium is intact. No focal lytic or blastic lesions are present. No significant extracranial soft tissue lesion is present. Sinuses/Orbits: Fluid is present in the right sphenoid sinus. Fluid is  also present right maxillary sinus. The paranasal sinuses and mastoid air cells are otherwise clear. Bilateral lens replacements are noted. Globes and orbits are otherwise unremarkable. IMPRESSION: 1. No acute intracranial abnormality or significant interval change. 2. Stable atrophy and white matter disease. This likely reflects the sequela of chronic microvascular ischemia. Electronically Signed   By: San Morelle M.D.   On: 10/29/2022 17:01   ECHOCARDIOGRAM COMPLETE  Result Date: 10/15/2022    ECHOCARDIOGRAM REPORT   Patient Name:   Donna Evans Date of Exam: 10/14/2022 Medical Rec #:  [id number]      Height:       65.0 in Accession #:    [id number]     Weight:       141.1 lb Date of Birth:  1946-12-03       BSA:          1.706 m Patient Age:    61 years       BP:           122/97 mmHg Patient Gender: F              HR:           145 bpm. Exam Location:  [facility] Procedure: 2D Echo, Color Doppler and Cardiac Doppler Indications:     I48.91 Atrial fibrillation  History:  Patient has prior history of Echocardiogram examinations, most                  recent 04/29/2022. COPD and Stroke; Risk Factors:Hypertension.  Sonographer:     Charmayne Sheer Referring Phys:  [id number] Emeterio Reeve Diagnosing Phys: Yolonda Kida MD  Sonographer Comments: Suboptimal parasternal window and suboptimal apical window. Image acquisition challenging due to breast implants and Image acquisition challenging due to COPD. IMPRESSIONS  1. TDS.  2. Left ventricular ejection fraction, by estimation, is 55 to 60%. The left ventricle has normal function. The left ventricle has no regional wall motion abnormalities. There is mild left ventricular hypertrophy. Left ventricular diastolic function could not be evaluated.  3. Right ventricular systolic function is normal. The right ventricular size is normal.  4. The mitral valve is normal in structure. No evidence of mitral valve regurgitation.  5. The aortic valve is normal in  structure. Aortic valve regurgitation is not visualized. Conclusion(s)/Recommendation(s): Poor windows for evaluation of left ventricular function by transthoracic echocardiography. Would recommend an alternative means of evaluation. FINDINGS  Left Ventricle: Left ventricular ejection fraction, by estimation, is 55 to 60%. The left ventricle has normal function. The left ventricle has no regional wall motion abnormalities. The left ventricular internal cavity size was normal in size. There is  mild left ventricular hypertrophy. Left ventricular diastolic function could not be evaluated. Right Ventricle: The right ventricular size is normal. No increase in right ventricular wall thickness. Right ventricular systolic function is normal. Left Atrium: Left atrial size was normal in size. Right Atrium: Right atrial size was normal in size. Pericardium: There is no evidence of pericardial effusion. Mitral Valve: The mitral valve is normal in structure. No evidence of mitral valve regurgitation. Tricuspid Valve: The tricuspid valve is normal in structure. Tricuspid valve regurgitation is trivial. Aortic Valve: The aortic valve is normal in structure. Aortic valve regurgitation is not visualized. Aortic valve mean gradient measures 2.0 mmHg. Aortic valve peak gradient measures 3.8 mmHg. Aortic valve area, by VTI measures 2.63 cm. Pulmonic Valve: The pulmonic valve was grossly normal. Pulmonic valve regurgitation is not visualized. Aorta: The ascending aorta was not well visualized. IAS/Shunts: No atrial level shunt detected by color flow Doppler. Additional Comments: TDS.  LEFT VENTRICLE PLAX 2D LVIDd:         3.40 cm   Diastology LVIDs:         2.30 cm   LV e' medial:    6.42 cm/s LV PW:         1.40 cm   LV E/e' medial:  12.6 LV IVS:        1.00 cm   LV e' lateral:   9.90 cm/s LVOT diam:     1.60 cm   LV E/e' lateral: 8.2 LV SV:         35 LV SV Index:   21 LVOT Area:     2.01 cm  RIGHT VENTRICLE RV Basal diam:  2.20 cm  LEFT ATRIUM           Index        RIGHT ATRIUM          Index LA diam:      3.00 cm 1.76 cm/m   RA Area:     9.16 cm LA Vol (A2C): 14.7 ml 8.62 ml/m   RA Volume:   19.40 ml 11.37 ml/m LA Vol (A4C): 26.5 ml 15.54 ml/m  AORTIC VALVE  PULMONIC VALVE AV Area (Vmax):    2.53 cm     PV Vmax:       1.15 m/s AV Area (Vmean):   2.52 cm     PV Vmean:      81.200 cm/s AV Area (VTI):     2.63 cm     PV VTI:        0.141 m AV Vmax:           97.80 cm/s   PV Peak grad:  5.3 mmHg AV Vmean:          69.600 cm/s  PV Mean grad:  3.0 mmHg AV VTI:            0.134 m AV Peak Grad:      3.8 mmHg AV Mean Grad:      2.0 mmHg LVOT Vmax:         123.00 cm/s LVOT Vmean:        87.100 cm/s LVOT VTI:          0.175 m LVOT/AV VTI ratio: 1.31  AORTA Ao Root diam: 3.10 cm MITRAL VALVE MV Area (PHT): 9.85 cm    SHUNTS MV Decel Time: 77 msec     Systemic VTI:  0.18 m MV E velocity: 80.70 cm/s  Systemic Diam: 1.60 cm Donna Prince Rome MD Electronically signed by Yolonda Kida MD Signature Date/Time: 10/15/2022/1:18:38 PM    Final    DG FL GUIDED LUMBAR PUNCTURE  Result Date: 10/08/2022 CLINICAL DATA:  Patient with altered mental status. Request received for diagnostic lumbar puncture. Ordering provider asked for high volume tap in case of need for further laboratory studies on fluid. EXAM: DIAGNOSTIC LUMBAR PUNCTURE UNDER FLUOROSCOPIC GUIDANCE COMPARISON:  None Available. FLUOROSCOPY: Radiation Exposure Index (as provided by the fluoroscopic device): 8.00 mGy Kerma PROCEDURE: Informed consent was obtained from the patient prior to the procedure, including potential complications of headache, allergy, and pain. With the patient prone, the lower back was prepped with Betadine. 1% Lidocaine was used for local anesthesia. Lumbar puncture was performed at the L4-L5 level using a 20 gauge needle with return of clear CSF with an opening pressure of 24 cm water, closing pressure of 14 cm water. 16 ml of CSF were obtained  for laboratory studies. The patient tolerated the procedure well and there were no apparent complications. IMPRESSION: 1.  Technically successful lumbar puncture at L4-L5 level. 2. Opening pressure of 24 cm water, closing pressure of 14 cm water. 3.  16 mL of clear CSF collected for laboratory study. Read by: Reatha Armour, PA-C Electronically Signed   By: Albin Felling M.D.   On: 10/08/2022 16:28   CT HEAD WO CONTRAST (5MM)  Result Date: 10/07/2022 CLINICAL DATA:  Altered mental status EXAM: CT HEAD WITHOUT CONTRAST TECHNIQUE: Contiguous axial images were obtained from the base of the skull through the vertex without intravenous contrast. RADIATION DOSE REDUCTION: This exam was performed according to the departmental dose-optimization program which includes automated exposure control, adjustment of the mA and/or kV according to patient size and/or use of iterative reconstruction technique. COMPARISON:  10/06/2022 FINDINGS: Brain: No acute intracranial findings are seen. There are no signs of bleeding within the cranium. Ventricles are not dilated. Cortical sulci are prominent. There is decreased density in periventricular white matter. Vascular: Unremarkable. Skull: Unremarkable. Sinuses/Orbits: There is mucosal thickening in right maxillary sinus. There is mucosal thickening and frothy density in right side of sphenoid sinus. Other: None. IMPRESSION: No acute intracranial  findings are seen in noncontrast CT brain. Atrophy. Small-vessel disease. Electronically Signed   By: Elmer Picker M.D.   On: 10/07/2022 15:51   EEG adult  Result Date: 10/07/2022 Lora Havens, MD     10/07/2022 12:28 PM Patient Name: Donna Evans MRN: [id number] Epilepsy Attending: Lora Havens Referring Physician/Provider: Jose Persia, MD Date: 10/07/2022 Duration: 31.51 mins Patient history: 76 year old female with altered mental status.  EEG to eval for seizure. Level of alertness: Awake AEDs during EEG study:  None Technical aspects: This EEG study was done with scalp electrodes positioned according to the 10-20 International system of electrode placement. Electrical activity was reviewed with band pass filter of 1-'70Hz'$ , sensitivity of 7 uV/mm, display speed of 70m/sec with a '60Hz'$  notched filter applied as appropriate. EEG data were recorded continuously and digitally stored.  Video monitoring was available and reviewed as appropriate. Description: The posterior dominant rhythm consists of 8-9 Hz activity of moderate voltage (25-35 uV) seen predominantly in posterior head regions, symmetric and reactive to eye opening and eye closing. Physiologic photic driving was seen during photic stimulation. Hyperventilation was not performed.   IMPRESSION: This study is within normal limits. No seizures or epileptiform discharges were seen throughout the recording. A normal interictal EEG does not exclude the diagnosis of epilepsy. Priyanka OBarbra Sarks  [place]domen Limited RUQ (LIVER/GB)  Result Date: 10/07/2022 CLINICAL DATA:  Transaminitis EXAM: ULTRASOUND ABDOMEN LIMITED RIGHT UPPER QUADRANT COMPARISON:  CT abdomen pelvis 09/24/2009 FINDINGS: Gallbladder: Surgically absent Common bile duct: Diameter: 3 mm Liver: Parenchymal echogenicity: Mild diffuse increase echogenicity. Contours: Normal Lesions: None Portal vein: Patent.  Hepatopetal flow Other: None. IMPRESSION: Mild diffuse increased echogenicity of the hepatic parenchyma is a nonspecific indicator of hepatocellular dysfunction, most commonly steatosis. Electronically Signed   By: FMiachel RouxM.D.   On: 10/07/2022 09:36   [place]ous Img Lower Bilateral (DVT)  Result Date: 10/07/2022 CLINICAL DATA:  Syncope EXAM: Bilateral lower Extremity Venous Doppler Ultrasound TECHNIQUE: Gray-scale sonography with compression, as well as color and duplex ultrasound, were performed to evaluate the deep venous system(s) from the level of the common femoral vein through the popliteal  and proximal calf veins. COMPARISON:  04/29/2022 FINDINGS: VENOUS Normal compressibility of the common femoral, superficial femoral, and popliteal veins, as well as the visualized calf veins. Visualized portions of profunda femoral vein and great saphenous vein unremarkable. No filling defects to suggest DVT on grayscale or color Doppler imaging. Doppler waveforms show normal direction of venous flow, normal respiratory plasticity and response to augmentation. OTHER None. Limitations: none IMPRESSION: No lower extremity DVT. Electronically Signed   By: FMiachel RouxM.D.   On: 10/07/2022 09:34   DG Ribs Unilateral Right  Result Date: 10/06/2022 CLINICAL DATA:  F[place] right rib fractures EXAM: RIGHT RIBS - 2 VIEW COMPARISON:  Shoulder x-ray 10/06/2022, chest x-ray 10/06/2022 FINDINGS: Frontal and oblique views of the right thoracic cage are obtained. Right breast prosthesis obscures portions of the underlying ribs. Right chest is clear without airspace disease, effusion, or pneumothorax. There are chronic healed right posterior eighth and ninth rib fractures. 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 There are no acute displaced fractures. IMPRESSION: 1. Multiple chronic right rib fractures as above. No acute bony abnormality. Electronically Signed   By: MRanda NgoM.D.   On: 10/06/2022 19:47   MR BRAIN WO CONTRAST  Result Date: 10/06/2022 CLINICAL DATA:  Mental status change.  Fall today. EXAM: MRI HEAD  WITHOUT CONTRAST TECHNIQUE: Multiplanar, multiecho pulse sequences of the brain and surrounding structures were obtained without intravenous contrast. COMPARISON:  CT head 10/06/2022 FINDINGS: Brain: Negative for acute infarct. Generalized mild atrophy. Chronic microvascular ischemic changes in the white matter. Pituitary overall normal in size. 5 mm T1 hyperintensity in the posterior pituitary best seen on sagittal T1 image 13. No suprasellar extension. Vascular: Normal  arterial flow voids Skull and upper cervical spine: No focal skeletal lesion. Sinuses/Orbits: Mucosal edema paranasal sinuses. Air-fluid level right maxillary sinus. Bilateral cataract extraction Other: None IMPRESSION: 1. Negative for acute infarct. 2. Atrophy and chronic microvascular ischemic change in the white matter. 3. 5 mm T1 hyperintensity in the posterior pituitary. Possible Rathke's cleft cyst or pituitary adenoma. Slight enlargement since the prior MRI of 04/28/2022 Electronically Signed   By: Franchot Gallo M.D.   On: 10/06/2022 18:57   DG Shoulder Right  Result Date: 10/06/2022 CLINICAL DATA:  Right shoulder pain EXAM: RIGHT SHOULDER - 2+ VIEW COMPARISON:  10/01/2014 FINDINGS: Healed fracture proximal right humerus. No acute fracture. Normal alignment. Multiple right rib fractures of indeterminate age. Recommend rib series of symptomatic. IMPRESSION: 1. No acute fracture of the shoulder. 2. Multiple right rib fractures of indeterminate age. Electronically Signed   By: Franchot Gallo M.D.   On: 10/06/2022 17:44   CT Head Wo Contrast  Result Date: 10/06/2022 CLINICAL DATA:  Acute stroke suspected EXAM: CT HEAD WITHOUT CONTRAST TECHNIQUE: Contiguous axial images were obtained from the base of the skull through the vertex without intravenous contrast. RADIATION DOSE REDUCTION: This exam was performed according to the departmental dose-optimization program which includes automated exposure control, adjustment of the mA and/or kV according to patient size and/or use of iterative reconstruction technique. COMPARISON:  CT Head 08/09/22 FINDINGS: Brain: No evidence of acute infarction, hemorrhage, hydrocephalus, extra-axial collection or mass lesion/mass effect. Vascular: No hyperdense vessel or unexpected calcification. Skull: Normal. Negative for fracture or focal lesion. Soft tissue swelling over the left parietal scalp Sinuses/Orbits: Bilateral lens replacement.Frothy secretions right maxillary  sinus. Other: None. IMPRESSION: 1. No acute intracranial abnormality. 2. Soft tissue swelling over the left parietal scalp. No underlying fracture. 3. Frothy secretions right maxillary sinus. Correlate for acute sinusitis. Electronically Signed   By: Marin Roberts M.D.   On: 10/06/2022 15:51   DG Chest Port 1 View  Result Date: 10/06/2022 CLINICAL DATA:  Question sepsis.  Fell today. EXAM: PORTABLE CHEST 1 VIEW COMPARISON:  08/09/2022 FINDINGS: Artifact overlies the chest. Density related to bilateral breast implants. The right chest appears clear allowing for that. Abnormal density in the left lower chest could be due to left lower lobe infiltrate/collapse/effusion. Old healed left humeral fracture. IMPRESSION: Abnormal density in the left lower chest that could be due to left lower lobe infiltrate/collapse/effusion. Electronically Signed   By: Nelson Chimes M.D.   On: 10/06/2022 14:03    Microbiology: Results for orders placed or performed during the hospital encounter of 10/06/22  Blood Culture (routine x 2)     Status: None   Collection Time: 10/06/22  1:23 PM   Specimen: BLOOD  Result Value Ref Range Status   Specimen Description   Final    BLOOD Blood Culture results may not be optimal due to an inadequate volume of blood received in culture bottles   Special Requests   Final    BOTTLES DRAWN AEROBIC AND ANAEROBIC RIGHT ANTECUBITAL   Culture   Final    NO GROWTH 5 DAYS Performed at [facility], [place], [place] [address]    Report Status 10/11/2022 FINAL  Final  Blood Culture (routine x 2)     Status: None   Collection Time: 10/06/22  1:24 PM   Specimen: BLOOD  Result Value Ref Range Status   Specimen Description BLOOD BLOOD LEFT ARM  Final   Special Requests   Final    BOTTLES DRAWN AEROBIC AND ANAEROBIC Blood Culture adequate volume   Culture   Final    NO GROWTH 5 DAYS Performed at [facility], [address], [place], [place] [address]     Report Status 10/11/2022 FINAL  Final  Urine Culture     Status: None   Collection Time: 10/06/22  1:28 PM   Specimen: Urine, Clean Catch  Result Value Ref Range Status   Specimen Description   Final    URINE, CLEAN CATCH Performed at [facility], [address], [place], [place] [address]    Special Requests   Final    NONE Performed at [facility], [address] [place], [place] [address]    Culture   Final    NO GROWTH Performed at [facility], [place] [address] [place], [place] [address]    Report Status 10/07/2022 FINAL  Final  Resp Panel by RT-PCR (Flu A&B, Covid) Anterior Nasal Swab     Status: None   Collection Time: 10/06/22  4:14 PM   Specimen: Anterior Nasal Swab  Result Value Ref Range Status   SARS Coronavirus 2 by RT PCR NEGATIVE NEGATIVE Final    Comment: (NOTE) SARS-CoV-2 target nucleic acids are NOT DETECTED.  The SARS-CoV-2 RNA is generally detectable in upper respiratory specimens during the acute phase of infection. The lowest concentration of SARS-CoV-2 viral copies this assay can detect is 138 copies/mL. A negative result does not preclude SARS-Cov-2 infection and should not be used as the sole basis for treatment or other patient management decisions. A negative result may occur with  improper specimen collection/handling, submission of specimen other than nasopharyngeal swab, presence of viral mutation(s) within the areas targeted by this assay, and inadequate number of viral copies(<138 copies/mL). A negative result must be combined with clinical observations, patient history, and epidemiological information. The expected result is Negative.  Fact Sheet for Patients:  EntrepreneurPulse.com.au  Fact Sheet for Healthcare Providers:  IncredibleEmployment.be  This test is no t yet approved or cleared by the [place] FDA and  has been authorized for detection and/or diagnosis of  SARS-CoV-2 by FDA under an Emergency Use Authorization (EUA). This EUA will remain  in effect (meaning this test can be used) for the duration of the COVID-19 declaration under Section 564(b)(1) of the Act, 21 U.S.C.section 360bbb-3(b)(1), unless the authorization is terminated  or revoked sooner.       Influenza A by PCR NEGATIVE NEGATIVE Final   Influenza B by PCR NEGATIVE NEGATIVE Final    Comment: (NOTE) The Xpert Xpress SARS-CoV-2/FLU/RSV plus assay is intended as an aid in the diagnosis of influenza from Nasopharyngeal swab specimens and should not be used as a sole basis for treatment. Nasal washings and aspirates are unacceptable for Xpert Xpress SARS-CoV-2/FLU/RSV testing.  Fact Sheet for Patients: EntrepreneurPulse.com.au  Fact Sheet for Healthcare Providers: IncredibleEmployment.be  This test is not yet approved or cleared by the [place] FDA and has been authorized for detection and/or diagnosis of SARS-CoV-2 by FDA under an Emergency Use Authorization (EUA). This EUA will remain in effect (  meaning this test can be used) for the duration of the COVID-19 declaration under Section 564(b)(1) of the Act, 21 U.S.C. section 360bbb-3(b)(1), unless the authorization is terminated or revoked.  Performed at [facility], [address] [place], [place] [address]   CSF culture w Gram Stain     Status: None   Collection Time: 10/08/22  3:30 PM   Specimen: CSF; Cerebrospinal Fluid  Result Value Ref Range Status   Specimen Description   Final    CSF Performed at [facility], [address] [place], [place] [address]    Special Requests   Final    NONE Performed at [facility] [address] [place], [place] [address]    Gram Stain   Final    NO ORGANISMS SEEN WBC SEEN RED BLOOD CELLS SEEN Performed at [facility], [address] [place], [place] [address]    Culture   Final     NO GROWTH 3 DAYS Performed at [facility], [place] [address], [place], [place] [address]    Report Status 10/11/2022 FINAL  Final    Labs: CBC: Recent Labs  Lab 10/29/22 1455 11/01/22 1402 11/02/22 0629  WBC 7.9 9.1 6.3  NEUTROABS 4.8 6.7  --   HGB 11.2* 12.5 10.7*  HCT 35.0* 38.8 33.3*  MCV 94.6 95.6 93.8  PLT 171 180 045   Basic Metabolic Panel: Recent Labs  Lab 10/29/22 1455 11/01/22 1402 11/02/22 0629  NA 143 144 144  K 4.4 4.0 4.1  CL 114* 116* 118*  CO2 22 20* 21*  GLUCOSE 118* 121* 94  BUN 54* 44* 41*  CREATININE 1.42* 1.22* 1.23*  CALCIUM 8.9 9.8 9.3  MG  --   --  2.1   Liver Function Tests: Recent Labs  Lab 11/01/22 1402 11/02/22 0629  AST 104* 80*  ALT 111* 78*  ALKPHOS 825* 765*  BILITOT 0.9 1.2  PROT 7.3 6.1*  ALBUMIN 3.8 3.1*   CBG: Recent Labs  Lab 10/29/22 1517  GLUCAP 82    Discharge time spent: greater than 30 minutes.  Signed: Sharen Hones, MD [facility] 11/02/2022

## 2022-11-09 ENCOUNTER — Emergency Department: Payer: PPO

## 2022-11-09 ENCOUNTER — Other Ambulatory Visit: Payer: Self-pay

## 2022-11-09 ENCOUNTER — Inpatient Hospital Stay
Admission: EM | Admit: 2022-11-09 | Discharge: 2022-11-18 | DRG: 871 | Disposition: A | Discharge status: Skilled Nursing Facility | Payer: PPO | Attending: Family Medicine | Admitting: Family Medicine

## 2022-11-09 ENCOUNTER — Encounter: Payer: Self-pay | Admitting: Emergency Medicine

## 2022-11-09 DIAGNOSIS — Z809 Family history of malignant neoplasm, unspecified: Secondary | ICD-10-CM

## 2022-11-09 DIAGNOSIS — Z681 Body mass index (BMI) 19 or less, adult: Secondary | ICD-10-CM

## 2022-11-09 DIAGNOSIS — Z853 Personal history of malignant neoplasm of breast: Secondary | ICD-10-CM | POA: Diagnosis not present

## 2022-11-09 DIAGNOSIS — N189 Chronic kidney disease, unspecified: Secondary | ICD-10-CM | POA: Diagnosis not present

## 2022-11-09 DIAGNOSIS — Z8673 Personal history of transient ischemic attack (TIA), and cerebral infarction without residual deficits: Secondary | ICD-10-CM

## 2022-11-09 DIAGNOSIS — Z79899 Other long term (current) drug therapy: Secondary | ICD-10-CM

## 2022-11-09 DIAGNOSIS — Z90710 Acquired absence of both cervix and uterus: Secondary | ICD-10-CM

## 2022-11-09 DIAGNOSIS — F0394 Unspecified dementia, unspecified severity, with anxiety: Secondary | ICD-10-CM | POA: Diagnosis not present

## 2022-11-09 DIAGNOSIS — I10 Essential (primary) hypertension: Secondary | ICD-10-CM | POA: Diagnosis present

## 2022-11-09 DIAGNOSIS — Z91048 Other nonmedicinal substance allergy status: Secondary | ICD-10-CM

## 2022-11-09 DIAGNOSIS — M5417 Radiculopathy, lumbosacral region: Secondary | ICD-10-CM | POA: Diagnosis present

## 2022-11-09 DIAGNOSIS — M329 Systemic lupus erythematosus, unspecified: Secondary | ICD-10-CM | POA: Diagnosis not present

## 2022-11-09 DIAGNOSIS — I130 Hypertensive heart and chronic kidney disease with heart failure and stage 1 through stage 4 chronic kidney disease, or unspecified chronic kidney disease: Secondary | ICD-10-CM | POA: Diagnosis present

## 2022-11-09 DIAGNOSIS — M810 Age-related osteoporosis without current pathological fracture: Secondary | ICD-10-CM | POA: Diagnosis present

## 2022-11-09 DIAGNOSIS — K219 Gastro-esophageal reflux disease without esophagitis: Secondary | ICD-10-CM | POA: Diagnosis present

## 2022-11-09 DIAGNOSIS — G894 Chronic pain syndrome: Secondary | ICD-10-CM | POA: Diagnosis not present

## 2022-11-09 DIAGNOSIS — E559 Vitamin D deficiency, unspecified: Secondary | ICD-10-CM | POA: Diagnosis not present

## 2022-11-09 DIAGNOSIS — Z803 Family history of malignant neoplasm of breast: Secondary | ICD-10-CM

## 2022-11-09 DIAGNOSIS — G629 Polyneuropathy, unspecified: Secondary | ICD-10-CM | POA: Diagnosis not present

## 2022-11-09 DIAGNOSIS — Z20822 Contact with and (suspected) exposure to covid-19: Secondary | ICD-10-CM | POA: Diagnosis present

## 2022-11-09 DIAGNOSIS — Z882 Allergy status to sulfonamides status: Secondary | ICD-10-CM

## 2022-11-09 DIAGNOSIS — A419 Sepsis, unspecified organism: Secondary | ICD-10-CM | POA: Diagnosis not present

## 2022-11-09 DIAGNOSIS — N390 Urinary tract infection, site not specified: Secondary | ICD-10-CM | POA: Diagnosis not present

## 2022-11-09 DIAGNOSIS — N1832 Chronic kidney disease, stage 3b: Secondary | ICD-10-CM | POA: Diagnosis not present

## 2022-11-09 DIAGNOSIS — I129 Hypertensive chronic kidney disease with stage 1 through stage 4 chronic kidney disease, or unspecified chronic kidney disease: Secondary | ICD-10-CM | POA: Diagnosis not present

## 2022-11-09 DIAGNOSIS — S2243XA Multiple fractures of ribs, bilateral, initial encounter for closed fracture: Secondary | ICD-10-CM | POA: Diagnosis not present

## 2022-11-09 DIAGNOSIS — R55 Syncope and collapse: Secondary | ICD-10-CM | POA: Diagnosis present

## 2022-11-09 DIAGNOSIS — R4182 Altered mental status, unspecified: Secondary | ICD-10-CM | POA: Diagnosis present

## 2022-11-09 DIAGNOSIS — E7800 Pure hypercholesterolemia, unspecified: Secondary | ICD-10-CM | POA: Diagnosis not present

## 2022-11-09 DIAGNOSIS — E275 Adrenomedullary hyperfunction: Secondary | ICD-10-CM | POA: Diagnosis not present

## 2022-11-09 DIAGNOSIS — M62838 Other muscle spasm: Secondary | ICD-10-CM | POA: Diagnosis not present

## 2022-11-09 DIAGNOSIS — Z9013 Acquired absence of bilateral breasts and nipples: Secondary | ICD-10-CM

## 2022-11-09 DIAGNOSIS — R6510 Systemic inflammatory response syndrome (SIRS) of non-infectious origin without acute organ dysfunction: Secondary | ICD-10-CM | POA: Diagnosis not present

## 2022-11-09 DIAGNOSIS — I4891 Unspecified atrial fibrillation: Principal | ICD-10-CM

## 2022-11-09 DIAGNOSIS — F03918 Unspecified dementia, unspecified severity, with other behavioral disturbance: Secondary | ICD-10-CM | POA: Diagnosis present

## 2022-11-09 DIAGNOSIS — I480 Paroxysmal atrial fibrillation: Secondary | ICD-10-CM | POA: Diagnosis present

## 2022-11-09 DIAGNOSIS — W19XXXA Unspecified fall, initial encounter: Secondary | ICD-10-CM | POA: Diagnosis not present

## 2022-11-09 DIAGNOSIS — E538 Deficiency of other specified B group vitamins: Secondary | ICD-10-CM | POA: Diagnosis not present

## 2022-11-09 DIAGNOSIS — S199XXA Unspecified injury of neck, initial encounter: Secondary | ICD-10-CM | POA: Diagnosis not present

## 2022-11-09 DIAGNOSIS — G9341 Metabolic encephalopathy: Secondary | ICD-10-CM | POA: Diagnosis present

## 2022-11-09 DIAGNOSIS — M1990 Unspecified osteoarthritis, unspecified site: Secondary | ICD-10-CM | POA: Diagnosis present

## 2022-11-09 DIAGNOSIS — M4856XA Collapsed vertebra, not elsewhere classified, lumbar region, initial encounter for fracture: Secondary | ICD-10-CM | POA: Diagnosis present

## 2022-11-09 DIAGNOSIS — I5032 Chronic diastolic (congestive) heart failure: Secondary | ICD-10-CM | POA: Diagnosis present

## 2022-11-09 DIAGNOSIS — G9340 Encephalopathy, unspecified: Secondary | ICD-10-CM | POA: Diagnosis not present

## 2022-11-09 DIAGNOSIS — Z9882 Breast implant status: Secondary | ICD-10-CM

## 2022-11-09 DIAGNOSIS — E079 Disorder of thyroid, unspecified: Secondary | ICD-10-CM | POA: Diagnosis present

## 2022-11-09 DIAGNOSIS — Z8249 Family history of ischemic heart disease and other diseases of the circulatory system: Secondary | ICD-10-CM

## 2022-11-09 DIAGNOSIS — S62609A Fracture of unspecified phalanx of unspecified finger, initial encounter for closed fracture: Secondary | ICD-10-CM | POA: Insufficient documentation

## 2022-11-09 DIAGNOSIS — S62616A Displaced fracture of proximal phalanx of right little finger, initial encounter for closed fracture: Secondary | ICD-10-CM | POA: Diagnosis present

## 2022-11-09 DIAGNOSIS — I4819 Other persistent atrial fibrillation: Secondary | ICD-10-CM | POA: Diagnosis present

## 2022-11-09 DIAGNOSIS — E785 Hyperlipidemia, unspecified: Secondary | ICD-10-CM | POA: Diagnosis present

## 2022-11-09 DIAGNOSIS — R296 Repeated falls: Secondary | ICD-10-CM | POA: Diagnosis present

## 2022-11-09 DIAGNOSIS — J449 Chronic obstructive pulmonary disease, unspecified: Secondary | ICD-10-CM | POA: Diagnosis not present

## 2022-11-09 DIAGNOSIS — Z7189 Other specified counseling: Secondary | ICD-10-CM

## 2022-11-09 DIAGNOSIS — N2889 Other specified disorders of kidney and ureter: Secondary | ICD-10-CM | POA: Diagnosis not present

## 2022-11-09 DIAGNOSIS — Z515 Encounter for palliative care: Secondary | ICD-10-CM | POA: Diagnosis not present

## 2022-11-09 DIAGNOSIS — Z833 Family history of diabetes mellitus: Secondary | ICD-10-CM

## 2022-11-09 DIAGNOSIS — M47812 Spondylosis without myelopathy or radiculopathy, cervical region: Secondary | ICD-10-CM | POA: Diagnosis not present

## 2022-11-09 DIAGNOSIS — F1320 Sedative, hypnotic or anxiolytic dependence, uncomplicated: Secondary | ICD-10-CM | POA: Diagnosis not present

## 2022-11-09 DIAGNOSIS — M797 Fibromyalgia: Secondary | ICD-10-CM | POA: Diagnosis present

## 2022-11-09 DIAGNOSIS — S62606D Fracture of unspecified phalanx of right little finger, subsequent encounter for fracture with routine healing: Secondary | ICD-10-CM | POA: Diagnosis not present

## 2022-11-09 DIAGNOSIS — E876 Hypokalemia: Secondary | ICD-10-CM | POA: Diagnosis not present

## 2022-11-09 DIAGNOSIS — K7581 Nonalcoholic steatohepatitis (NASH): Secondary | ICD-10-CM | POA: Diagnosis not present

## 2022-11-09 DIAGNOSIS — Z841 Family history of disorders of kidney and ureter: Secondary | ICD-10-CM

## 2022-11-09 DIAGNOSIS — F411 Generalized anxiety disorder: Secondary | ICD-10-CM | POA: Diagnosis present

## 2022-11-09 DIAGNOSIS — I951 Orthostatic hypotension: Secondary | ICD-10-CM | POA: Diagnosis not present

## 2022-11-09 DIAGNOSIS — Z91040 Latex allergy status: Secondary | ICD-10-CM

## 2022-11-09 DIAGNOSIS — Z887 Allergy status to serum and vaccine status: Secondary | ICD-10-CM

## 2022-11-09 DIAGNOSIS — R636 Underweight: Secondary | ICD-10-CM | POA: Diagnosis present

## 2022-11-09 DIAGNOSIS — G4700 Insomnia, unspecified: Secondary | ICD-10-CM | POA: Diagnosis present

## 2022-11-09 DIAGNOSIS — Z7901 Long term (current) use of anticoagulants: Secondary | ICD-10-CM | POA: Diagnosis not present

## 2022-11-09 DIAGNOSIS — R413 Other amnesia: Secondary | ICD-10-CM | POA: Diagnosis present

## 2022-11-09 DIAGNOSIS — Z888 Allergy status to other drugs, medicaments and biological substances status: Secondary | ICD-10-CM

## 2022-11-09 DIAGNOSIS — S32000A Wedge compression fracture of unspecified lumbar vertebra, initial encounter for closed fracture: Secondary | ICD-10-CM

## 2022-11-09 DIAGNOSIS — F1910 Other psychoactive substance abuse, uncomplicated: Secondary | ICD-10-CM | POA: Diagnosis present

## 2022-11-09 DIAGNOSIS — Z8701 Personal history of pneumonia (recurrent): Secondary | ICD-10-CM

## 2022-11-09 DIAGNOSIS — R410 Disorientation, unspecified: Secondary | ICD-10-CM | POA: Diagnosis not present

## 2022-11-09 DIAGNOSIS — Z66 Do not resuscitate: Secondary | ICD-10-CM | POA: Diagnosis present

## 2022-11-09 DIAGNOSIS — Z885 Allergy status to narcotic agent status: Secondary | ICD-10-CM

## 2022-11-09 DIAGNOSIS — Z87891 Personal history of nicotine dependence: Secondary | ICD-10-CM | POA: Diagnosis not present

## 2022-11-09 DIAGNOSIS — Z602 Problems related to living alone: Secondary | ICD-10-CM | POA: Diagnosis present

## 2022-11-09 DIAGNOSIS — Z9841 Cataract extraction status, right eye: Secondary | ICD-10-CM

## 2022-11-09 DIAGNOSIS — Z9181 History of falling: Secondary | ICD-10-CM

## 2022-11-09 DIAGNOSIS — Z87448 Personal history of other diseases of urinary system: Secondary | ICD-10-CM

## 2022-11-09 DIAGNOSIS — M179 Osteoarthritis of knee, unspecified: Secondary | ICD-10-CM | POA: Diagnosis present

## 2022-11-09 DIAGNOSIS — R0902 Hypoxemia: Secondary | ICD-10-CM | POA: Diagnosis not present

## 2022-11-09 DIAGNOSIS — Y92009 Unspecified place in unspecified non-institutional (private) residence as the place of occurrence of the external cause: Secondary | ICD-10-CM | POA: Diagnosis not present

## 2022-11-09 DIAGNOSIS — Z961 Presence of intraocular lens: Secondary | ICD-10-CM | POA: Diagnosis present

## 2022-11-09 DIAGNOSIS — Z9049 Acquired absence of other specified parts of digestive tract: Secondary | ICD-10-CM

## 2022-11-09 DIAGNOSIS — I639 Cerebral infarction, unspecified: Secondary | ICD-10-CM | POA: Diagnosis not present

## 2022-11-09 DIAGNOSIS — Z87440 Personal history of urinary (tract) infections: Secondary | ICD-10-CM

## 2022-11-09 DIAGNOSIS — K838 Other specified diseases of biliary tract: Secondary | ICD-10-CM | POA: Diagnosis not present

## 2022-11-09 DIAGNOSIS — R079 Chest pain, unspecified: Secondary | ICD-10-CM | POA: Diagnosis not present

## 2022-11-09 DIAGNOSIS — Z881 Allergy status to other antibiotic agents status: Secondary | ICD-10-CM

## 2022-11-09 DIAGNOSIS — M545 Low back pain, unspecified: Secondary | ICD-10-CM | POA: Diagnosis present

## 2022-11-09 DIAGNOSIS — Z87442 Personal history of urinary calculi: Secondary | ICD-10-CM

## 2022-11-09 DIAGNOSIS — Z9842 Cataract extraction status, left eye: Secondary | ICD-10-CM

## 2022-11-09 LAB — CBC
HCT: 41.3 % (ref 36.0–46.0)
Hemoglobin: 13.7 g/dL (ref 12.0–15.0)
MCH: 30.5 pg (ref 26.0–34.0)
MCHC: 33.2 g/dL (ref 30.0–36.0)
MCV: 92 fL (ref 80.0–100.0)
Platelets: 210 10*3/uL (ref 150–400)
RBC: 4.49 MIL/uL (ref 3.87–5.11)
RDW: 14.4 % (ref 11.5–15.5)
WBC: 11.8 10*3/uL — ABNORMAL HIGH (ref 4.0–10.5)
nRBC: 0 % (ref 0.0–0.2)

## 2022-11-09 LAB — CBG MONITORING, ED
Glucose-Capillary: 66 mg/dL — ABNORMAL LOW (ref 70–99)
Glucose-Capillary: 85 mg/dL (ref 70–99)
Glucose-Capillary: 201 mg/dL — ABNORMAL HIGH (ref 70–99)

## 2022-11-09 LAB — TROPONIN I (HIGH SENSITIVITY)
Troponin I (High Sensitivity): 20 ng/L — ABNORMAL HIGH (ref <18)
Troponin I (High Sensitivity): 20 ng/L — ABNORMAL HIGH (ref <18)

## 2022-11-09 LAB — URINALYSIS, ROUTINE W REFLEX MICROSCOPIC
Bilirubin Urine: NEGATIVE
Glucose, UA: NEGATIVE mg/dL
Hgb urine dipstick: NEGATIVE
Ketones, ur: 5 mg/dL — AB
Nitrite: NEGATIVE
Protein, ur: NEGATIVE mg/dL
Specific Gravity, Urine: 1.025 (ref 1.005–1.030)
pH: 5 (ref 5.0–8.0)

## 2022-11-09 LAB — COMPREHENSIVE METABOLIC PANEL
ALT: 50 U/L — ABNORMAL HIGH (ref 0–44)
AST: 47 U/L — ABNORMAL HIGH (ref 15–41)
Albumin: 4.1 g/dL (ref 3.5–5.0)
Alkaline Phosphatase: 913 U/L — ABNORMAL HIGH (ref 38–126)
Anion gap: 13 (ref 5–15)
BUN: 43 mg/dL — ABNORMAL HIGH (ref 8–23)
CO2: 18 mmol/L — ABNORMAL LOW (ref 22–32)
Calcium: 9.4 mg/dL (ref 8.9–10.3)
Chloride: 110 mmol/L (ref 98–111)
Creatinine, Ser: 1.35 mg/dL — ABNORMAL HIGH (ref 0.44–1.00)
GFR, Estimated: 41 mL/min — ABNORMAL LOW (ref >60)
Glucose, Bld: 98 mg/dL (ref 70–99)
Potassium: 3.6 mmol/L (ref 3.5–5.1)
Sodium: 141 mmol/L (ref 135–145)
Total Bilirubin: 1.6 mg/dL — ABNORMAL HIGH (ref 0.3–1.2)
Total Protein: 8.5 g/dL — ABNORMAL HIGH (ref 6.5–8.1)

## 2022-11-09 LAB — URINE DRUG SCREEN, QUALITATIVE (ARMC ONLY)
Amphetamines, Ur Screen: NOT DETECTED
Barbiturates, Ur Screen: NOT DETECTED
Benzodiazepine, Ur Scrn: POSITIVE — AB
Cannabinoid 50 Ng, Ur ~~LOC~~: NOT DETECTED
Cocaine Metabolite,Ur ~~LOC~~: NOT DETECTED
MDMA (Ecstasy)Ur Screen: NOT DETECTED
Methadone Scn, Ur: NOT DETECTED
Opiate, Ur Screen: NOT DETECTED
Phencyclidine (PCP) Ur S: NOT DETECTED
Tricyclic, Ur Screen: POSITIVE — AB

## 2022-11-09 LAB — RESP PANEL BY RT-PCR (FLU A&B, COVID) ARPGX2
Influenza A by PCR: NEGATIVE
Influenza B by PCR: NEGATIVE
SARS Coronavirus 2 by RT PCR: NEGATIVE

## 2022-11-09 LAB — PROCALCITONIN: Procalcitonin: <0.1 ng/mL

## 2022-11-09 LAB — LACTIC ACID, PLASMA: Lactic Acid, Venous: 1.7 mmol/L (ref 0.5–1.9)

## 2022-11-09 LAB — MAGNESIUM: Magnesium: 2 mg/dL (ref 1.7–2.4)

## 2022-11-09 LAB — PHOSPHORUS: Phosphorus: 2.9 mg/dL (ref 2.5–4.6)

## 2022-11-09 LAB — CK: Total CK: 111 U/L (ref 38–234)

## 2022-11-09 MED ORDER — IOHEXOL 300 MG/ML  SOLN
80.0000 mL | Freq: Once | INTRAMUSCULAR | Status: AC | PRN
  Administered 2022-11-09: 80 mL via INTRAVENOUS

## 2022-11-09 MED ORDER — SODIUM CHLORIDE 0.9 % IV SOLN
2.0000 g | INTRAVENOUS | Status: DC
  Administered 2022-11-09 – 2022-11-11 (×3): 2 g via INTRAVENOUS
  Filled 2022-11-09: qty 2
  Filled 2022-11-09 (×2): qty 20

## 2022-11-09 MED ORDER — ONDANSETRON HCL 4 MG/2ML IJ SOLN
4.0000 mg | Freq: Four times a day (QID) | INTRAMUSCULAR | Status: AC | PRN
  Administered 2022-11-12: 4 mg via INTRAVENOUS
  Filled 2022-11-09: qty 2

## 2022-11-09 MED ORDER — ALUM & MAG HYDROXIDE-SIMETH 200-200-20 MG/5ML PO SUSP: 15.0000 mL | Freq: Four times a day (QID) | ORAL | Status: DC | PRN

## 2022-11-09 MED ORDER — ZOLPIDEM TARTRATE 5 MG PO TABS
5.0000 mg | ORAL_TABLET | Freq: Every evening | ORAL | Status: DC | PRN
  Administered 2022-11-10 – 2022-11-17 (×8): 5 mg via ORAL
  Filled 2022-11-09 (×8): qty 1

## 2022-11-09 MED ORDER — NICOTINE 14 MG/24HR TD PT24: 14.0000 mg | MEDICATED_PATCH | Freq: Every day | TRANSDERMAL | Status: DC | PRN

## 2022-11-09 MED ORDER — DIGOXIN 125 MCG PO TABS
125.0000 ug | ORAL_TABLET | Freq: Every day | ORAL | Status: DC
  Administered 2022-11-09 – 2022-11-15 (×7): 125 ug via ORAL
  Filled 2022-11-09 (×7): qty 1

## 2022-11-09 MED ORDER — METOPROLOL TARTRATE 50 MG PO TABS
50.0000 mg | ORAL_TABLET | Freq: Two times a day (BID) | ORAL | Status: DC
  Administered 2022-11-09 – 2022-11-11 (×5): 50 mg via ORAL
  Filled 2022-11-09: qty 1
  Filled 2022-11-09: qty 2
  Filled 2022-11-09 (×2): qty 1
  Filled 2022-11-09: qty 2

## 2022-11-09 MED ORDER — FOLIC ACID 1 MG PO TABS
1.0000 mg | ORAL_TABLET | Freq: Every day | ORAL | Status: DC
  Administered 2022-11-09 – 2022-11-18 (×10): 1 mg via ORAL
  Filled 2022-11-09 (×10): qty 1

## 2022-11-09 MED ORDER — SODIUM CHLORIDE 0.9 % IV BOLUS
1000.0000 mL | Freq: Once | INTRAVENOUS | Status: AC
  Administered 2022-11-09: 1000 mL via INTRAVENOUS

## 2022-11-09 MED ORDER — METOPROLOL TARTRATE 5 MG/5ML IV SOLN
5.0000 mg | Freq: Once | INTRAVENOUS | Status: DC
  Filled 2022-11-09: qty 5

## 2022-11-09 MED ORDER — APIXABAN 5 MG PO TABS
5.0000 mg | ORAL_TABLET | Freq: Two times a day (BID) | ORAL | Status: DC
  Administered 2022-11-09 – 2022-11-18 (×18): 5 mg via ORAL
  Filled 2022-11-09 (×18): qty 1

## 2022-11-09 MED ORDER — VITAMIN D (ERGOCALCIFEROL) 1.25 MG (50000 UNIT) PO CAPS
50000.0000 [IU] | ORAL_CAPSULE | ORAL | Status: DC
  Administered 2022-11-14: 50000 [IU] via ORAL
  Filled 2022-11-09: qty 1

## 2022-11-09 MED ORDER — ONDANSETRON HCL 4 MG PO TABS: 4.0000 mg | ORAL_TABLET | Freq: Four times a day (QID) | ORAL | Status: AC | PRN

## 2022-11-09 MED ORDER — ACETAMINOPHEN 650 MG RE SUPP: 650.0000 mg | Freq: Four times a day (QID) | RECTAL | Status: AC | PRN

## 2022-11-09 MED ORDER — DEXTROSE IN LACTATED RINGERS 5 % IV SOLN: INTRAVENOUS | Status: DC

## 2022-11-09 MED ORDER — ROSUVASTATIN CALCIUM 10 MG PO TABS
10.0000 mg | ORAL_TABLET | Freq: Every day | ORAL | Status: DC
  Administered 2022-11-10 – 2022-11-18 (×9): 10 mg via ORAL
  Filled 2022-11-09 (×9): qty 1

## 2022-11-09 MED ORDER — ACETAMINOPHEN 325 MG PO TABS
650.0000 mg | ORAL_TABLET | Freq: Four times a day (QID) | ORAL | Status: AC | PRN
  Administered 2022-11-10: 650 mg via ORAL
  Filled 2022-11-09: qty 2

## 2022-11-09 MED ORDER — ENOXAPARIN SODIUM 30 MG/0.3ML IJ SOSY: 30.0000 mg | PREFILLED_SYRINGE | INTRAMUSCULAR | Status: DC

## 2022-11-09 MED ORDER — LIDOCAINE 5 % EX PTCH
1.0000 | MEDICATED_PATCH | CUTANEOUS | Status: DC
  Administered 2022-11-09 – 2022-11-11 (×3): 1 via TRANSDERMAL
  Filled 2022-11-09 (×3): qty 1

## 2022-11-09 NOTE — ED Triage Notes (Signed)
Pt to er, pt states that she fell, pt states that she doesn't know how long she was on the floor.  Pt awake and oriented times three, pt knows the day of the week, she is in the hospital, and self, pt reports pain from her back down.

## 2022-11-09 NOTE — ED Triage Notes (Signed)
Presents via EMS  from home  Was found on floor  Unknown down time  Per EMS she is altered today

## 2022-11-09 NOTE — Assessment & Plan Note (Addendum)
Patient found down. Concern patient may have had episode secondary to polypharmacy vs acute infection. Recent Transthoracic Echocardiogram from 10/14/22 with normal LVEF of 55-60% and no regional wall motion abnormality, although poor evaluation noted. Patient with baseline atrial fibrillation with intermittent RVR mostly with ambulation. Patient also with known orthostatic hypotension which may have been contributory as well.

## 2022-11-09 NOTE — Hospital Course (Addendum)
Donna Evans is a 76 y.o. female with a history of atrial fibrillation on Eliquis, hypertension, hyperlipidemia, insomnia. Patient presented secondary to altered mental status of unclear etiology but possibly secondary to UTI. Concern for possible underlying cognitive impairment. Sepsis criteria met on admission. Patient treated empirically for a UTI with improvement in symptoms. Hospitalization complicated by atrial fibrillation with RVR and orthostatic hypotension. PT/OT recommending SNF.

## 2022-11-09 NOTE — Progress Notes (Signed)
PHARMACIST - PHYSICIAN COMMUNICATION  CONCERNING:  Enoxaparin (Lovenox) for DVT Prophylaxis    RECOMMENDATION: Patient was prescribed enoxaprin '40mg'$  q24 hours for VTE prophylaxis.   Filed Weights   11/09/22 1427  Weight: 50.8 kg (112 lb)    Body mass index is 17.54 kg/m.  Estimated Creatinine Clearance: 28.4 mL/min (A) (by C-G formula based on SCr of 1.35 mg/dL (H)).   Patient is candidate for enoxaparin '30mg'$  every 24 hours based on CrCl <40m/min or Weight <45kg  DESCRIPTION: [facility]  Patient is now receiving enoxaparin 30 mg every 24 hours    HLorin Picket PharmD Clinical Pharmacist  11/09/2022 6:13 PM

## 2022-11-09 NOTE — Assessment & Plan Note (Addendum)
Patient with orthostatic hypotension as well. Complicated by atrial fibrillation and use of metoprolol.

## 2022-11-09 NOTE — Assessment & Plan Note (Addendum)
Continue Crestor 

## 2022-11-09 NOTE — Assessment & Plan Note (Addendum)
Stable

## 2022-11-09 NOTE — ED Notes (Signed)
Blood and blood culture number one drawn via iv start in R ac

## 2022-11-09 NOTE — Assessment & Plan Note (Addendum)
Presumed secondary to UTI but possibly multifactorial with possible polypharmacy. Encephalopathy resolved.

## 2022-11-09 NOTE — ED Provider Triage Note (Signed)
Emergency Medicine Provider Triage Evaluation Note  Donna Evans , a 76 y.o. female  was evaluated in triage.  Pt to the ER via EMS after being found on the floor. Altered mental status per EMS. Unknown downtime.  Physical Exam  There were no vitals taken for this visit. Gen:   Awake, no distress   Resp:  Normal effort  MSK:   Moves extremities without difficulty  Other:    Medical Decision Making  Medically screening exam initiated at 2:22 PM.  Appropriate orders placed.  Donna Evans was informed that the remainder of the evaluation will be completed by another provider, this initial triage assessment does not replace that evaluation, and the importance of remaining in the ED until their evaluation is complete.  Glucose 66. Altered mental status protocol, CK, CT head and cervical spine.   Donna Dike, FNP 11/09/22 1454

## 2022-11-09 NOTE — Assessment & Plan Note (Addendum)
Rate controlled at rest. Cardiology consulted with recommendations to increase Lopressor frequency to TID dosing, in addition to addition of amiodarone. -Continue Lopressor, digoxin, amiodarone, Eliquis.

## 2022-11-09 NOTE — ED Provider Notes (Signed)
[facility] Provider Note    Event Date/Time   First MD Initiated Contact with Patient 11/09/22 1503     (approximate)   History   Fall   HPI  Donna Evans is a 76 y.o. female extensive past medical history including TIA, diastolic heart failure not on anticoagulation as well is a history of polysubstance abuse and benzodiazepine dependence lives at home alone with recent admissions to the hospital presents to the ER after being found down at home by home health nurse who comes a few times a week to check on her.  Few days of her pills were missing she was found between a table and couch.  She is complaining of back pain.  Denies any chest pain but she does seem very confused with slurred speech.  Last seen normal was greater than 24 hours ago.     Physical Exam   Triage Vital Signs: ED Triage Vitals  Enc Vitals Group     BP 11/09/22 1425 131/69     Pulse Rate 11/09/22 1425 83     Resp 11/09/22 1425 18     Temp 11/09/22 1425 97.8 F (36.6 C)     Temp Source 11/09/22 1425 Oral     SpO2 11/09/22 1425 100 %     Weight 11/09/22 1427 112 lb (50.8 kg)     Height 11/09/22 1427 '5\' 7"'$  (1.702 m)     Head Circumference --      Peak Flow --      Pain Score 11/09/22 1426 8     Pain Loc --      Pain Edu? --      Excl. in [place]? --     Most recent vital signs: Vitals:   11/09/22 1555 11/09/22 1707  BP: (!) 137/94 (!) 119/92  Pulse: (!) 110 (!) 113  Resp: 20 18  Temp: (!) 97.5 F (36.4 C) 97.6 F (36.4 C)  SpO2: 100% 100%     Constitutional: Ill-appearing Eyes: Conjunctivae are normal.  Head: Atraumatic. Nose: No congestion/rhinnorhea. Mouth/Throat: Mucous membranes are dry Neck: Painless ROM.  Cardiovascular:   Good peripheral circulation. Respiratory: Normal respiratory effort.  No retractions.  Gastrointestinal: Soft and nontender.  Musculoskeletal:  no deformity.  Low lumbar tenderness palpation no step-offs or deformities. Neurologic:  MAE  spontaneously. No gross focal neurologic deficits are appreciated.  Skin:  Skin is warm, dry and intact. No rash noted. Psychiatric: Mood and affect are normal. Speech and behavior are normal.    ED Results / Procedures / Treatments   Labs (all labs ordered are listed, but only abnormal results are displayed) Labs Reviewed  COMPREHENSIVE METABOLIC PANEL - Abnormal; Notable for the following components:      Result Value   CO2 18 (*)    BUN 43 (*)    Creatinine, Ser 1.35 (*)    Total Protein 8.5 (*)    AST 47 (*)    ALT 50 (*)    Alkaline Phosphatase 913 (*)    Total Bilirubin 1.6 (*)    GFR, Estimated 41 (*)    All other components within normal limits  CBC - Abnormal; Notable for the following components:   WBC 11.8 (*)    All other components within normal limits  URINALYSIS, ROUTINE W REFLEX MICROSCOPIC - Abnormal; Notable for the following components:   Color, Urine YELLOW (*)    APPearance HAZY (*)    Ketones, ur 5 (*)    Leukocytes,Ua  SMALL (*)    Bacteria, UA RARE (*)    All other components within normal limits  CBG MONITORING, ED - Abnormal; Notable for the following components:   Glucose-Capillary 66 (*)    All other components within normal limits  TROPONIN I (HIGH SENSITIVITY) - Abnormal; Notable for the following components:   Troponin I (High Sensitivity) 20 (*)    All other components within normal limits  CULTURE, BLOOD (ROUTINE X 2)  CULTURE, BLOOD (ROUTINE X 2)  RESP PANEL BY RT-PCR (FLU A&B, COVID) ARPGX2  CK  URINE DRUG SCREEN, QUALITATIVE ([facility] ONLY)  PHOSPHORUS  MAGNESIUM  BASIC METABOLIC PANEL  CBC  PROCALCITONIN  CBG MONITORING, ED  TROPONIN I (HIGH SENSITIVITY)     EKG  ED ECG REPORT I, Merlyn Lot, the attending physician, personally viewed and interpreted this ECG.   Date: 11/09/2022  EKG Time: 15:06  Rate: 120  Rhythm: afib with rvr  Axis: normal  Intervals:normal  ST&T Change: no stemi, nonspecific st  abn    RADIOLOGY Please see ED Course for my review and interpretation.  I personally reviewed all radiographic images ordered to evaluate for the above acute complaints and reviewed radiology reports and findings.  These findings were personally discussed with the patient.  Please see medical record for radiology report.    PROCEDURES:  Critical Care performed: Yes, see critical care procedure note(s)  Procedures   MEDICATIONS ORDERED IN ED: Medications  dextrose 5 % in lactated ringers infusion ( Intravenous New Bag/Given 11/09/22 1515)  metoprolol tartrate (LOPRESSOR) injection 5 mg (has no administration in time range)  rosuvastatin (CRESTOR) tablet 10 mg (has no administration in time range)  acetaminophen (TYLENOL) tablet 650 mg (has no administration in time range)    Or  acetaminophen (TYLENOL) suppository 650 mg (has no administration in time range)  ondansetron (ZOFRAN) tablet 4 mg (has no administration in time range)    Or  ondansetron (ZOFRAN) injection 4 mg (has no administration in time range)  enoxaparin (LOVENOX) injection 40 mg (has no administration in time range)  sodium chloride 0.9 % bolus 1,000 mL (1,000 mLs Intravenous New Bag/Given 11/09/22 1515)  iohexol (OMNIPAQUE) 300 MG/ML solution 80 mL (80 mLs Intravenous Contrast Given 11/09/22 1548)     IMPRESSION / MDM / ASSESSMENT AND PLAN / ED COURSE  I reviewed the triage vital signs and the nursing notes.                              Differential diagnosis includes, but is not limited to, Dehydration, sepsis, pna, uti, hypoglycemia, cva, drug effect, withdrawal, encephalitis  Patient presenting to the ER for evaluation of symptoms as described above.  Based on symptoms, risk factors and considered above differential, this presenting complaint could reflect a potentially life-threatening illness therefore the patient will be placed on continuous pulse oximetry and telemetry for monitoring.  Laboratory  evaluation will be sent to evaluate for the above complaints.  Patient with evidence of A-fib with.  She does appear clinically dehydrated.  Blood work sent for above differential.  Will order CT imaging given her confusion found down concern for traumatic injury.    Clinical Course as of 11/09/22 1812  Tue Nov 09, 2022  1528 Chest x-ray my review and interpretation with no evidence of consolidation. [PR]  4098 CT head on my review and interpretation does not show any evidence of hemorrhage or mass. [PR]  Clinical Course User Index [PR] Merlyn Lot, MD   Given her presentation with new onset A-fib with RVR receiving IVF and Lopressor, altered mental status concern for TIA or CVA also presentation concerning given extensive substance use history dependence patient living at home alone now with new documented lumbar compression fracture.  Will discuss case with hospitalist for admission.   FINAL CLINICAL IMPRESSION(S) / ED DIAGNOSES   Final diagnoses:  Atrial fibrillation with RVR (HCC)  Acute encephalopathy  Lumbar compression fracture, closed, initial encounter ([place])     Rx / DC Orders   ED Discharge Orders     None        Note:  This document was prepared using Dragon voice recognition software and may include unintentional dictation errors.    Merlyn Lot, MD 11/09/22 [phone]

## 2022-11-09 NOTE — Assessment & Plan Note (Addendum)
Associated symptoms of dysuria. Urine culture with multiple species. Patient completed Ceftriaxone IV x3 days.

## 2022-11-09 NOTE — ED Notes (Signed)
Brought to room 49.  Patient is alert and awake.  She is difficult to understand due to slurring, but answers appropriately.  No facial droop.  She is warm and dry.  Monitor a fib 120s.  She is able to straighten her legs.

## 2022-11-09 NOTE — H&P (Addendum)
History and Physical   Donna Evans UJW:[id number] DOB: 1946-03-29 DOA: 11/09/2022  PCP: Jodi Marble, MD  Patient coming from: Home via EMS  I have personally briefly reviewed patient's old medical records in [place].  Chief Concern: Altered mental status  HPI: Donna Evans is a 76 year old female with history of atrial fibrillation Eliquis, hypertension, hyperlipidemia, insomnia, who presents to the emergency department for chief concerns of altered mental status.  Initial vitals in the emergency department showed temperature 97.8, respiration rate of 18, heart rate of 83, blood pressure 131/69, SpO2 100% on room air.  Serum sodium is 141, potassium 3.6, chloride 110, bicarb 18, BUN of 43, serum creatinine 1.35, nonfasting glucose 98, EGFR 41, WBC 11.8, hemoglobin 13.7, platelets of 210.  CK was 111.  High sensitive troponin was 20.  UA was positive for small leukocytes.  COVID/influenza A/influenza B PCR were negative.  ED treatment: Sodium chloride 1 L bolus, metoprolol 5 mg IV one-time dose. -------------------- At bedside, she is able to tell me her name, age, current location of hospital.  Patient exhibiting word salad and is redirectable.  She is able to follow commands.  She reports abdominal pain with dysuria.  She denies nausea, vomiting, chest pain, head trauma. She does not know how she ended up on floor.   Social history: She lives alone.  She is an infrequent tobacco user.  She denies EtOH, recreational drug use.  ROS: Constitutional: no weight change, no fever ENT/Mouth: no sore throat, no rhinorrhea Eyes: no eye pain, no vision changes Cardiovascular: no chest pain, no dyspnea,  no edema, no palpitations Respiratory: no cough, no sputum, no wheezing Gastrointestinal: no nausea, no vomiting, no diarrhea, no constipation Genitourinary: no urinary incontinence, no dysuria, no hematuria Musculoskeletal: no arthralgias, no myalgias Skin: no skin  lesions, no pruritus, Neuro: + weakness, no loss of consciousness, no syncope Psych: no anxiety, no depression, no decrease appetite Heme/Lymph: no bruising, no bleeding  ED Course: Discussed with emergency medicine provider, patient requiring hospitalization for chief concerns of altered mental status.  Assessment/Plan  Principal Problem:   Acute metabolic encephalopathy Active Problems:   SIRS (systemic inflammatory response syndrome) (HCC)   Syncope   Fibromyalgia syndrome   Generalized anxiety disorder   Chronic pain syndrome   Hypertension, benign   Memory change   HLD (hyperlipidemia)   Paroxysmal atrial fibrillation with RVR (HCC)   Stage 3b chronic kidney disease (CKD) (HCC)   UTI (urinary tract infection)   Assessment and Plan:  * Acute metabolic encephalopathy - Etiology workup in progress, initial presumptive etiology is secondary to UTI - Check procalcitonin, blood cultures x 2-are in process - Urine culture added - Ceftriaxone 2 g daily, 5 doses ordered - Fall precautions  SIRS (systemic inflammatory response syndrome) (HCC) - Elevated heart rate, leukocytosis, suspected source is urine - Etiology workup in progress - Check lactic acid x 2, added urine culture to UA - Blood cultures x 2 are in process - Procalcitonin ordered - Ceftriaxone 2 g IV, 5 doses ordered  Syncope - For precautions, presumed secondary to UTI  UTI (urinary tract infection) POA - Urine culture - Ceftriaxone 2 g IV, 5 days ordered  Stage 3b chronic kidney disease (CKD) (HCC) - At baseline  Paroxysmal atrial fibrillation with RVR (HCC) - Apixaban 5 mg p.o. twice daily, metoprolol tartrate 50 mg p.o. twice daily, digoxin 125 mcg daily resumed  HLD (hyperlipidemia) - Rosuvastatin 10 mg daily resumed  Hypertension, benign -  Metoprolol tartrate 50 mg twice daily resumed  Generalized anxiety disorder - Patient is not on antianxiety medication  Chart reviewed.   DVT  prophylaxis: Eliquis Code Status: DNR Diet: Heart healthy Family Communication: updated nephew, Tomma Rakers who wishes to be update Disposition Plan: Pending clinical course Consults called: TOC Admission status: Telemetry cardiac, observation  Past Medical History:  Diagnosis Date   Allergic state    Anginal pain ([place])    Prinzmetal's angina   Anxiety    Arthritis    osteoarthritis   Breast cancer ([place]) 1990   right breast cancer   Chronic pain    Chronic pain    COPD (chronic obstructive pulmonary disease) ([place])    Depression    Edema    Fibromyalgia    Foot fracture    Bilateral   GERD (gastroesophageal reflux disease)    History of kidney stones    Hypertension    Leg fracture, right    Low back pain    Lumbosacral neuritis    Medulloadrenal hyperfunction (HCC)    Migraine headache    Peripheral neuropathy    Shoulder fracture, right    Stroke ([place])    TIA   Systemic lupus erythematosus ([place])    Thyroid disease    TIA (transient ischemic attack) 11/28/2013   Transient global amnesia 2011   Past Surgical History:  Procedure Laterality Date   ABDOMINAL HYSTERECTOMY  1981   APPENDECTOMY  1957   AUGMENTATION MAMMAPLASTY Bilateral 1990   saline submuscular   BACK SURGERY  1989   BILATERAL TOTAL MASTECTOMY WITH AXILLARY LYMPH NODE DISSECTION  1990   BREAST IMPLANT EXCHANGE Bilateral 05/11/2016   Procedure: REMOVAL AND REPLACEMENT OF BREAST IMPLANTS ;  Surgeon: Irene Limbo, MD;  Location: [place];  Service: Plastics;  Laterality: Bilateral;   CAPSULECTOMY Bilateral 05/11/2016   Procedure: CAPSULECTOMY CAPSULORRAPHY ;  Surgeon: Irene Limbo, MD;  Location: [place];  Service: Plastics;  Laterality: Bilateral;   CATARACT EXTRACTION W/PHACO Right 12/26/2018   Procedure: CATARACT EXTRACTION PHACO AND INTRAOCULAR LENS PLACEMENT ([place]) RIGHT;  Surgeon: Birder Robson, MD;  Location: [facility];  Service: Ophthalmology;   Laterality: Right;  [place]  00:46 CDE 8.50 Fluid pack lot # [id number]   CATARACT EXTRACTION W/PHACO Left 01/23/2019   Procedure: CATARACT EXTRACTION PHACO AND INTRAOCULAR LENS PLACEMENT ([place]) LEFT;  Surgeon: Birder Robson, MD;  Location: [facility];  Service: Ophthalmology;  Laterality: Left;  [place]  00:45 CDE  8.41 fluid pack lot # [id number]   CHOLECYSTECTOMY  1979   COLONOSCOPY WITH PROPOFOL N/A 01/25/2018   Procedure: COLONOSCOPY WITH PROPOFOL;  Surgeon: Manya Silvas, MD;  Location: [facility];  Service: Endoscopy;  Laterality: N/A;   COPD     ESOPHAGOGASTRODUODENOSCOPY (EGD) WITH PROPOFOL N/A 03/07/2018   Procedure: ESOPHAGOGASTRODUODENOSCOPY (EGD) WITH PROPOFOL;  Surgeon: Manya Silvas, MD;  Location: [facility];  Service: Endoscopy;  Laterality: N/A;   FRACTURE SURGERY     HARDWARE REMOVAL Left 06/18/2020   Procedure: HARDWARE REMOVAL;  Surgeon: Lovell Sheehan, MD;  Location: [facility];  Service: Orthopedics;  Laterality: Left;   HUMERUS IM NAIL Left 12/31/2019   Procedure: INTRAMEDULLARY (IM) NAIL HUMERAL;  Surgeon: Lovell Sheehan, MD;  Location: [facility];  Service: Orthopedics;  Laterality: Left;   LIPOSUCTION Right 05/11/2016   Procedure: LIPOSUCTION;  Surgeon: Irene Limbo, MD;  Location: [place];  Service: Plastics;  Laterality: Right;   LIVER BIOPSY  2011   MASTECTOMY  SUBCUTANEOUS Bilateral 1990   MASTOPEXY Bilateral 05/11/2016   Procedure: MASTOPEXY BILATERAL ;  Surgeon: Irene Limbo, MD;  Location: [place];  Service: Plastics;  Laterality: Bilateral;   Social History:  reports that she quit smoking about 17 years ago. Her smoking use included cigarettes. She has a 40.00 pack-year smoking history. She has never used smokeless tobacco. She reports that she does not currently use alcohol. She reports that she does not use drugs.  Allergies  Allergen Reactions   Ciprofloxacin Nausea And Vomiting and Hives   Codeine Anaphylaxis and  Hives    Reaction:  Unknown    Demerol [Meperidine] Anaphylaxis and Hives    Reaction:  Unknown     Fluconazole Hives   Latex Anaphylaxis and Rash   Morphine And Related Hives   Doxycycline Nausea And Vomiting   Flagyl [Metronidazole] Nausea And Vomiting    Reaction:  Unknown    Influenza Vaccines Swelling    Localized swelling   Tape Other (See Comments)    Pt allergic to Adhesive tape and latex.(  Skin breaks out)   Valtrex [Valacyclovir Hcl] Hives   Buprenorphine     Other reaction(s): Other (see comments) unknown   Duloxetine Hcl Other (See Comments)    Reaction:  Unknown    Tramadol     Other reaction(s): Not available   Amoxicillin-Pot Clavulanate Nausea And Vomiting   Sulfamethoxazole-Trimethoprim Nausea And Vomiting   Valacyclovir Rash   Family History  Problem Relation Age of Onset   Atrial fibrillation Mother    Atrial fibrillation Sister    Cancer Sister    Diabetes Sister    Breast cancer Sister 58   Diabetes Father    Cancer Sister    Diabetes Sister    Atrial fibrillation Sister    Kidney disease Maternal Aunt    Family history: Family history reviewed and not pertinent  Prior to Admission medications   Medication Sig Start Date End Date Taking? Authorizing Provider  alum & mag hydroxide-simeth (MAALOX/MYLANTA) 200-200-20 MG/5ML suspension Take 15 mLs by mouth every 6 (six) hours as needed for indigestion or heartburn.    [provider]  apixaban (ELIQUIS) 5 MG TABS tablet Take 1 tablet (5 mg total) by mouth 2 (two) times daily. 10/18/22   Emeterio Reeve, DO  calcium carbonate (TUMS - DOSED IN MG ELEMENTAL CALCIUM) 500 MG chewable tablet Chew 1 tablet by mouth 3 (three) times daily as needed for indigestion or heartburn.    [provider]  Cyanocobalamin (VITAMIN B-12 IJ) Inject 1,000 mg as directed every 30 (thirty) days.    [provider]  cyclobenzaprine (FLEXERIL) 10 MG tablet Take 10 mg by mouth 3 (three) times daily.  07/23/22   [provider]  digoxin (LANOXIN) 0.125 MG tablet Take 1 tablet (125 mcg total) by mouth daily for 7 days. 11/02/22 11/09/22  Sharen Hones, MD  folic acid (FOLVITE) 1 MG tablet Take 1 tablet (1 mg total) by mouth daily. 10/18/22   Emeterio Reeve, DO  metoprolol tartrate (LOPRESSOR) 50 MG tablet Take 1 tablet (50 mg total) by mouth 2 (two) times daily. 10/18/22   Emeterio Reeve, DO  rosuvastatin (CRESTOR) 10 MG tablet Take 10 mg by mouth daily. 02/24/22   [provider]  Vitamin D, Ergocalciferol, 50000 units CAPS Take 1 capsule by mouth once a week. 07/23/22   [provider]  zolpidem (AMBIEN) 10 MG tablet Take 0.5 tablets (5 mg total) by mouth at bedtime. 10/18/22  Emeterio Reeve, DO   Physical Exam: Vitals:   11/09/22 1555 11/09/22 1707 11/09/22 1800 11/09/22 2117  BP: (!) 137/94 (!) 119/92 135/78 103/81  Pulse: (!) 110 (!) 113 81   Resp: _0 Temp: (!) 97.5 F (36.4 C) 97.6 F (36.4 C)    TempSrc: Oral Oral    SpO2: 100% 100% 100% 100%  Weight:      Height:       Constitutional: appears frail, NAD, calm, comfortable Eyes: PERRL, lids and conjunctivae normal ENMT: Mucous membranes are moist. Posterior pharynx clear of any exudate or lesions. Age-appropriate dentition. Hearing appropriate Neck: normal, supple, no masses, no thyromegaly Respiratory: clear to auscultation bilaterally, no wheezing, no crackles. Normal respiratory effort. No accessory muscle use.  Cardiovascular: Regular rate and rhythm, no murmurs / rubs / gallops. No extremity edema. 2+ pedal pulses. No carotid bruits.  Abdomen: no tenderness, no masses palpated, no hepatosplenomegaly. Bowel sounds positive.  Musculoskeletal: no clubbing / cyanosis. No joint deformity upper and lower extremities. Good ROM, no contractures, no atrophy. Normal muscle tone.  Skin: no rashes, lesions, ulcers. No induration Neurologic: Sensation intact. Strength 5/5 in all 4.   Psychiatric: Normal judgment and insight. Alert and oriented x 3. Normal mood.   EKG: independently reviewed, showing atrial fibrillation with RVR, rate of 119, QTc 441  Chest x-ray on Admission: I personally reviewed and I agree with radiologist reading as below.  MR BRAIN WO CONTRAST  Result Date: 11/09/2022 CLINICAL DATA:  Stroke follow-up EXAM: MRI HEAD WITHOUT CONTRAST TECHNIQUE: Multiplanar, multiecho pulse sequences of the brain and surrounding structures were obtained without intravenous contrast. COMPARISON:  None Available. FINDINGS: Brain: No acute infarct, mass effect or extra-axial collection. No acute or chronic hemorrhage. There is multifocal hyperintense T2-weighted signal within the white matter. Generalized volume loss. The midline structures are normal. Vascular: Major flow voids are preserved. Skull and upper cervical spine: Normal calvarium and skull base. Visualized upper cervical spine and soft tissues are normal. Sinuses/Orbits:Fluid in the right maxillary and sphenoid sinuses. No mastoid or middle ear effusion. Normal orbits. IMPRESSION: 1. No acute intracranial abnormality. 2. Findings of chronic small vessel ischemia and volume loss. Electronically Signed   By: Ulyses Jarred M.D.   On: 11/09/2022 19:02   CT CHEST ABDOMEN PELVIS W CONTRAST  Result Date: 11/09/2022 CLINICAL DATA:  Blunt trauma. Patient found down for unknown period of time. Uncooperative patient. EXAM: CT CHEST, ABDOMEN, AND PELVIS WITH CONTRAST TECHNIQUE: Multidetector CT imaging of the chest, abdomen and pelvis was performed following the standard protocol during bolus administration of intravenous contrast. RADIATION DOSE REDUCTION: This exam was performed according to the departmental dose-optimization program which includes automated exposure control, adjustment of the mA and/or kV according to patient size and/or use of iterative reconstruction technique. CONTRAST:  37m OMNIPAQUE IOHEXOL 300 MG/ML   SOLN COMPARISON:  None Available. FINDINGS: CT CHEST FINDINGS Cardiovascular: No significant vascular findings. Normal heart size. No pericardial effusion. Mediastinum/Nodes: No axillary or supraclavicular adenopathy. No mediastinal or hilar adenopathy. No pericardial fluid. Esophagus normal. Lungs/Pleura: No pneumothorax. Pulmonary contusion. No pleural fluid. No pneumonia. Biapical pleuroparenchymal thickening favored benign. Centrilobular emphysema the upper lobes. Musculoskeletal: Healed posterior rib fractures. No acute rib fracture. CT ABDOMEN AND PELVIS FINDINGS Hepatobiliary: No hepatic laceration. Postcholecystectomy. Mild intra and extra hepatic biliary duct dilatation is favored chronic. Pancreas: Pancreas is normal. No ductal dilatation. No pancreatic inflammation. Spleen: No splenic injury. Adrenals/urinary tract: Thickening LEFT adrenal gland to 2 cm (image  51/4. Washout of contrast (greater than 40%) on delayed imaging indicates benign adrenal adenoma. Bilateral renal cortical thinning. Ureters normal. Bladder intact Stomach/Bowel: The stomach, duodenum, and small bowel normal. The colon and rectosigmoid colon are normal. Vascular/Lymphatic: Abdominal aorta is normal caliber with atherosclerotic calcification. There is no retroperitoneal or periportal lymphadenopathy. No pelvic lymphadenopathy. Reproductive: Post hysterectomy.  Adnexa unremarkable Other: No free fluid. Musculoskeletal: Superior endplate compression fracture at L2. There is sclerosis along the depressed fracture plane. No paraspinal hematoma evident. Approximately 26% loss vertebral body height centrally (image 84/8 and image 52/7). No significant retropulsion. IMPRESSION: Chest Impression: 1. No evidence of thoracic trauma. 2. Bilateral most rib fractures. 3. Biapical chronic pleuroparenchymal scarring. Abdomen / Pelvis Impression: 1. Age-indeterminate compression fracture at L2. Proximally 20% loss vertebral body height. No  significant retropulsion. 2. Bilateral renal cortical thinning. 3. Mild biliary duct dilatation following cholecystectomy. Consider correlation with bilirubin levels. 4. Benign LEFT adrenal adenoma. Electronically Signed   By: Suzy Bouchard M.D.   On: 11/09/2022 16:07   CT Cervical Spine Wo Contrast  Result Date: 11/09/2022 CLINICAL DATA:  Found down EXAM: CT CERVICAL SPINE WITHOUT CONTRAST TECHNIQUE: Multidetector CT imaging of the cervical spine was performed without intravenous contrast. Multiplanar CT image reconstructions were also generated. RADIATION DOSE REDUCTION: This exam was performed according to the departmental dose-optimization program which includes automated exposure control, adjustment of the mA and/or kV according to patient size and/or use of iterative reconstruction technique. COMPARISON:  11/01/2022 FINDINGS: Alignment: Normal. Skull base and vertebrae: No acute fracture. No primary bone lesion or focal pathologic process. Soft tissues and spinal canal: No prevertebral fluid or swelling. No visible canal hematoma. Disc levels: Multilevel degenerative disc disease noted with loss of disc space and marginal osteophytes from the C3 through the C7 levels. There is osteoarthritis identified at C1-C2. Facet joint degenerative changes noted most severely at C3-4 on the right and bilaterally at C4-5. Upper chest: Negative. Other: None. IMPRESSION: Multilevel degenerative changes.  No acute traumatic abnormalities. Electronically Signed   By: Sammie Bench M.D.   On: 11/09/2022 15:55   CT Head Wo Contrast  Result Date: 11/09/2022 CLINICAL DATA:  Mental status change, unknown cause EXAM: CT HEAD WITHOUT CONTRAST TECHNIQUE: Contiguous axial images were obtained from the base of the skull through the vertex without intravenous contrast. RADIATION DOSE REDUCTION: This exam was performed according to the departmental dose-optimization program which includes automated exposure control,  adjustment of the mA and/or kV according to patient size and/or use of iterative reconstruction technique. COMPARISON:  11/01/2022 FINDINGS: Brain: There is periventricular white matter decreased attenuation consistent with small vessel ischemic changes. Ventricles, sulci and cisterns are prominent consistent with age related involutional changes. No acute intracranial hemorrhage, mass effect or shift. No hydrocephalus. Vascular: No hyperdense vessel or unexpected calcification. Skull: Normal. Negative for fracture or focal lesion. Sinuses/Orbits: No acute finding. IMPRESSION: Atrophy and chronic small vessel ischemic changes. No acute intracranial process identified. Electronically Signed   By: Sammie Bench M.D.   On: 11/09/2022 15:53   DG Chest 1 View  Result Date: 11/09/2022 CLINICAL DATA:  Altered mental status EXAM: CHEST  1 VIEW COMPARISON:  10/29/2022 FINDINGS: Remote proximal left humerus fracture. Remote posterior upper left rib fractures. Patient rotated to the right. Midline trachea. Normal heart size. Mild right hemidiaphragm elevation. No pleural effusion or pneumothorax. Bilateral breast implants, projecting over the lungs. No lobar consolidation. IMPRESSION: No acute cardiopulmonary disease. Electronically Signed   By: Adria Devon.D.  On: 11/09/2022 15:20    Labs on Admission: I have personally reviewed following labs CBC: Recent Labs  Lab 11/09/22 1447  WBC 11.8*  HGB 13.7  HCT 41.3  MCV 92.0  PLT 604   Basic Metabolic Panel: Recent Labs  Lab 11/09/22 1447 11/09/22 1947  NA 141  --   K 3.6  --   CL 110  --   CO2 18*  --   GLUCOSE 98  --   BUN 43*  --   CREATININE 1.35*  --   CALCIUM 9.4  --   MG  --  2.0  PHOS  --  2.9   GFR: Estimated Creatinine Clearance: 28.4 mL/min (A) (by C-G formula based on SCr of 1.35 mg/dL (H)).  Liver Function Tests: Recent Labs  Lab 11/09/22 1447  AST 47*  ALT 50*  ALKPHOS 913*  BILITOT 1.6*  PROT 8.5*  ALBUMIN 4.1    Cardiac Enzymes: Recent Labs  Lab 11/09/22 1447  CKTOTAL 111   CBG: Recent Labs  Lab 11/09/22 1433 11/09/22 1737 11/09/22 1951  GLUCAP 66* 85 201*   Urine analysis:    Component Value Date/Time   COLORURINE YELLOW (A) 11/09/2022 1744   APPEARANCEUR HAZY (A) 11/09/2022 1744   LABSPEC 1.025 11/09/2022 1744   PHURINE 5.0 11/09/2022 1744   GLUCOSEU NEGATIVE 11/09/2022 1744   HGBUR NEGATIVE 11/09/2022 1744   BILIRUBINUR NEGATIVE 11/09/2022 1744   BILIRUBINUR neg 11/10/2015 1352   KETONESUR 5 (A) 11/09/2022 1744   PROTEINUR NEGATIVE 11/09/2022 1744   UROBILINOGEN negative 11/10/2015 1352   UROBILINOGEN 0.2 11/20/2013 0201   NITRITE NEGATIVE 11/09/2022 1744   LEUKOCYTESUR SMALL (A) 11/09/2022 1744   Dr. Tobie Poet [facility]  If 7PM-7AM, please contact overnight-coverage provider If 7AM-7PM, please contact day coverage provider [email or web address]  11/09/2022, 11:04 PM

## 2022-11-09 NOTE — Assessment & Plan Note (Addendum)
Patient presented with tachycardia, leukocytosis and concern for UTI based on symptoms of dysuria. Blood cultures without growth. Patient completed 3 days of Ceftriaxone for UTI treatment.

## 2022-11-09 NOTE — Assessment & Plan Note (Addendum)
Noted  

## 2022-11-10 DIAGNOSIS — F1910 Other psychoactive substance abuse, uncomplicated: Secondary | ICD-10-CM | POA: Diagnosis present

## 2022-11-10 DIAGNOSIS — F411 Generalized anxiety disorder: Secondary | ICD-10-CM | POA: Diagnosis not present

## 2022-11-10 DIAGNOSIS — Z681 Body mass index (BMI) 19 or less, adult: Secondary | ICD-10-CM | POA: Diagnosis not present

## 2022-11-10 DIAGNOSIS — F1320 Sedative, hypnotic or anxiolytic dependence, uncomplicated: Secondary | ICD-10-CM | POA: Diagnosis present

## 2022-11-10 DIAGNOSIS — E876 Hypokalemia: Secondary | ICD-10-CM | POA: Diagnosis not present

## 2022-11-10 DIAGNOSIS — N390 Urinary tract infection, site not specified: Secondary | ICD-10-CM | POA: Diagnosis present

## 2022-11-10 DIAGNOSIS — W19XXXA Unspecified fall, initial encounter: Secondary | ICD-10-CM | POA: Diagnosis present

## 2022-11-10 DIAGNOSIS — G9341 Metabolic encephalopathy: Secondary | ICD-10-CM | POA: Diagnosis present

## 2022-11-10 DIAGNOSIS — Y92009 Unspecified place in unspecified non-institutional (private) residence as the place of occurrence of the external cause: Secondary | ICD-10-CM | POA: Diagnosis not present

## 2022-11-10 DIAGNOSIS — G629 Polyneuropathy, unspecified: Secondary | ICD-10-CM | POA: Diagnosis present

## 2022-11-10 DIAGNOSIS — S62616A Displaced fracture of proximal phalanx of right little finger, initial encounter for closed fracture: Secondary | ICD-10-CM | POA: Diagnosis present

## 2022-11-10 DIAGNOSIS — I130 Hypertensive heart and chronic kidney disease with heart failure and stage 1 through stage 4 chronic kidney disease, or unspecified chronic kidney disease: Secondary | ICD-10-CM | POA: Diagnosis present

## 2022-11-10 DIAGNOSIS — F03918 Unspecified dementia, unspecified severity, with other behavioral disturbance: Secondary | ICD-10-CM | POA: Diagnosis present

## 2022-11-10 DIAGNOSIS — Z20822 Contact with and (suspected) exposure to covid-19: Secondary | ICD-10-CM | POA: Diagnosis present

## 2022-11-10 DIAGNOSIS — I5032 Chronic diastolic (congestive) heart failure: Secondary | ICD-10-CM | POA: Diagnosis present

## 2022-11-10 DIAGNOSIS — I4819 Other persistent atrial fibrillation: Secondary | ICD-10-CM | POA: Diagnosis present

## 2022-11-10 DIAGNOSIS — E785 Hyperlipidemia, unspecified: Secondary | ICD-10-CM | POA: Diagnosis present

## 2022-11-10 DIAGNOSIS — J449 Chronic obstructive pulmonary disease, unspecified: Secondary | ICD-10-CM | POA: Diagnosis present

## 2022-11-10 DIAGNOSIS — Z7189 Other specified counseling: Secondary | ICD-10-CM | POA: Diagnosis not present

## 2022-11-10 DIAGNOSIS — M329 Systemic lupus erythematosus, unspecified: Secondary | ICD-10-CM | POA: Diagnosis present

## 2022-11-10 DIAGNOSIS — R4182 Altered mental status, unspecified: Secondary | ICD-10-CM | POA: Diagnosis present

## 2022-11-10 DIAGNOSIS — M4856XA Collapsed vertebra, not elsewhere classified, lumbar region, initial encounter for fracture: Secondary | ICD-10-CM | POA: Diagnosis present

## 2022-11-10 DIAGNOSIS — K7581 Nonalcoholic steatohepatitis (NASH): Secondary | ICD-10-CM | POA: Diagnosis present

## 2022-11-10 DIAGNOSIS — N1832 Chronic kidney disease, stage 3b: Secondary | ICD-10-CM | POA: Diagnosis present

## 2022-11-10 DIAGNOSIS — Z66 Do not resuscitate: Secondary | ICD-10-CM | POA: Diagnosis present

## 2022-11-10 DIAGNOSIS — Z515 Encounter for palliative care: Secondary | ICD-10-CM | POA: Diagnosis not present

## 2022-11-10 DIAGNOSIS — I10 Essential (primary) hypertension: Secondary | ICD-10-CM | POA: Diagnosis not present

## 2022-11-10 DIAGNOSIS — A419 Sepsis, unspecified organism: Secondary | ICD-10-CM | POA: Diagnosis present

## 2022-11-10 DIAGNOSIS — E275 Adrenomedullary hyperfunction: Secondary | ICD-10-CM | POA: Diagnosis present

## 2022-11-10 DIAGNOSIS — G9340 Encephalopathy, unspecified: Secondary | ICD-10-CM | POA: Insufficient documentation

## 2022-11-10 DIAGNOSIS — F0394 Unspecified dementia, unspecified severity, with anxiety: Secondary | ICD-10-CM | POA: Diagnosis present

## 2022-11-10 LAB — CBC
HCT: 33 % — ABNORMAL LOW (ref 36.0–46.0)
Hemoglobin: 10.6 g/dL — ABNORMAL LOW (ref 12.0–15.0)
MCH: 30.4 pg (ref 26.0–34.0)
MCHC: 32.1 g/dL (ref 30.0–36.0)
MCV: 94.6 fL (ref 80.0–100.0)
Platelets: 176 10*3/uL (ref 150–400)
RBC: 3.49 MIL/uL — ABNORMAL LOW (ref 3.87–5.11)
RDW: 14.6 % (ref 11.5–15.5)
WBC: 7.2 10*3/uL (ref 4.0–10.5)
nRBC: 0 % (ref 0.0–0.2)

## 2022-11-10 LAB — LACTIC ACID, PLASMA
Lactic Acid, Venous: 2.1 mmol/L (ref 0.5–1.9)
Lactic Acid, Venous: 1.1 mmol/L (ref 0.5–1.9)

## 2022-11-10 LAB — BASIC METABOLIC PANEL
Anion gap: 9 (ref 5–15)
BUN: 31 mg/dL — ABNORMAL HIGH (ref 8–23)
CO2: 21 mmol/L — ABNORMAL LOW (ref 22–32)
Calcium: 8.6 mg/dL — ABNORMAL LOW (ref 8.9–10.3)
Chloride: 114 mmol/L — ABNORMAL HIGH (ref 98–111)
Creatinine, Ser: 1.09 mg/dL — ABNORMAL HIGH (ref 0.44–1.00)
GFR, Estimated: 53 mL/min — ABNORMAL LOW (ref >60)
Glucose, Bld: 106 mg/dL — ABNORMAL HIGH (ref 70–99)
Potassium: 3.1 mmol/L — ABNORMAL LOW (ref 3.5–5.1)
Sodium: 144 mmol/L (ref 135–145)

## 2022-11-10 MED ORDER — LACTATED RINGERS IV BOLUS
500.0000 mL | Freq: Once | INTRAVENOUS | Status: AC
  Administered 2022-11-10: 500 mL via INTRAVENOUS

## 2022-11-10 MED ORDER — HYDROCODONE-ACETAMINOPHEN 5-325 MG PO TABS
1.0000 | ORAL_TABLET | ORAL | Status: DC | PRN
  Administered 2022-11-10 – 2022-11-17 (×22): 1 via ORAL
  Filled 2022-11-10 (×23): qty 1

## 2022-11-10 MED ORDER — POTASSIUM CHLORIDE CRYS ER 20 MEQ PO TBCR
40.0000 meq | EXTENDED_RELEASE_TABLET | Freq: Two times a day (BID) | ORAL | Status: AC
  Administered 2022-11-10 – 2022-11-11 (×4): 40 meq via ORAL
  Filled 2022-11-10 (×4): qty 2

## 2022-11-10 MED ORDER — ALPRAZOLAM 0.25 MG PO TABS
0.2500 mg | ORAL_TABLET | Freq: Once | ORAL | Status: AC
  Administered 2022-11-10: 0.25 mg via ORAL
  Filled 2022-11-10: qty 1

## 2022-11-10 NOTE — ED Notes (Signed)
ORTHO TECH TO ROOM AT THIS TIME TO PLACE T[facility]O BRACE

## 2022-11-10 NOTE — Progress Notes (Signed)
PROGRESS NOTE    Donna Evans  SJG:[id number] DOB: October 07, 1946 DOA: 11/09/2022 PCP: Jodi Marble, MD    Brief Narrative:  76 year old female with history of A-fib on Eliquis, hypertension, hyperlipidemia, insomnia brought to the emergency department with confusion and fall at home, complaining of back pain.  In the emergency room hemodynamically stable.  On room air.  UA was abnormal otherwise nonfocal exam.  Patient did complain of abdominal pain with dysuria. Recent multiple hospitalization, recently discharged on 11/21 where she was admitted with confusion and fall with negative work-up.  Lives alone with some caretaker support.   Assessment & Plan:  Acute metabolic encephalopathy likely secondary to UTI, likely underlying dementia with exacerbation of symptoms. Urine culture and blood cultures pending.  Procalcitonin less than 0.1.  Continue Rocephin today.  No evidence of urinary obstruction. Nonfocal exam.  MRI brain essentially normal.  Recent MRI brain 1 week ago was normal. Recent extensive work-up including B12, TSH normal. Patient will probably need more supervised living, placement.  Recurrent fall, L2 compression fracture which is probably chronic: No neurological deficit. Patient was given T SLO brace that is making her very uncomfortable.  Her fracture is stable.  Discontinue brace.  Provided with pain medications.  Mobilize with PT OT.  Refer to SNF.  CKD stage IIIb, at about baseline.  Paroxysmal A-fib: Currently in sinus rhythm.  Patient on digoxin metoprolol and therapeutic on Eliquis.  Insomnia: Uses Ambien at night.  Trying to avoid benzodiazepines to avoid polypharmacy.  -I called and discussed case with patient's nephew.  Apparently patient lives alone with intermittent caretaker support.  She has been falling and currently unable to live by herself under the circumstances.  We discussed that patient will need a SNF and in the long-term she may need assisted  living level of care. Consult PT OT.  Refer to SNF for rehab.   DVT prophylaxis: Place TED hose Start: 11/09/22 1801 apixaban (ELIQUIS) tablet 5 mg   Code Status: Full code Family Communication: Patient's nephew Mr. Ronalee Belts on the phone Disposition Plan: Status is: Observation The patient will require care spanning > 2 midnights and should be moved to inpatient because: Significant back pain, immobility, UTI on IV antibiotics, altered mental status. Patient is not safe to discharge home under current circumstances.     Consultants:  Palliative care  Procedures:  None  Antimicrobials:  Rocephin 11/28----   Subjective: Patient seen and examined.  She was laying in bed with mittens on and braces that were so uncomfortable.  Patient was easily distractible and continues to talk about her heart when asking any questions.  There were no family at the bedside, however I was able to talk to her nephew and get more history.  Patient remains afebrile.  Objective: Vitals:   11/10/22 0839 11/10/22 1130 11/10/22 1149 11/10/22 1150  BP:    (!) 148/72  Pulse: (!) 111 (!) 116 (!) 112 (!) 112  Resp: 17 16    Temp:      TempSrc:      SpO2:  100%    Weight:      Height:        Intake/Output Summary (Last 24 hours) at 11/10/2022 1318 Last data filed at 11/09/2022 2031 Gross per 24 hour  Intake 2100 ml  Output --  Net 2100 ml   Filed Weights   11/09/22 1427  Weight: 50.8 kg    Examination:  General exam: Appears anxious.  In mild to moderate distress.  Flat affect. Alert and oriented to self and place.  Not oriented to time. Respiratory system: Clear to auscultation. Respiratory effort normal. Cardiovascular system: S1 & S2 heard, RRR. No JVD, murmurs, rubs, gallops or clicks. No pedal edema. Gastrointestinal system: Abdomen is nondistended, soft and nontender. No organomegaly or masses felt. Normal bowel sounds heard. Central nervous system: No focal neurological  deficits. Extremities: Symmetric 5 x 5 power.  Moves all extremities.    Data Reviewed: I have personally reviewed following labs and imaging studies  CBC: Recent Labs  Lab 11/09/22 1447 11/10/22 0531  WBC 11.8* 7.2  HGB 13.7 10.6*  HCT 41.3 33.0*  MCV 92.0 94.6  PLT 210 967   Basic Metabolic Panel: Recent Labs  Lab 11/09/22 1447 11/09/22 1947 11/10/22 0531  NA 141  --  144  K 3.6  --  3.1*  CL 110  --  114*  CO2 18*  --  21*  GLUCOSE 98  --  106*  BUN 43*  --  31*  CREATININE 1.35*  --  1.09*  CALCIUM 9.4  --  8.6*  MG  --  2.0  --   PHOS  --  2.9  --    GFR: Estimated Creatinine Clearance: 35.2 mL/min (A) (by C-G formula based on SCr of 1.09 mg/dL (H)). Liver Function Tests: Recent Labs  Lab 11/09/22 1447  AST 47*  ALT 50*  ALKPHOS 913*  BILITOT 1.6*  PROT 8.5*  ALBUMIN 4.1   No results for input(s): "LIPASE", "AMYLASE" in the last 168 hours. No results for input(s): "AMMONIA" in the last 168 hours. Coagulation Profile: No results for input(s): "INR", "PROTIME" in the last 168 hours. Cardiac Enzymes: Recent Labs  Lab 11/09/22 1447  CKTOTAL 111   BNP (last 3 results) No results for input(s): "PROBNP" in the last 8760 hours. HbA1C: No results for input(s): "HGBA1C" in the last 72 hours. CBG: Recent Labs  Lab 11/09/22 1433 11/09/22 1737 11/09/22 1951  GLUCAP 66* 85 201*   Lipid Profile: No results for input(s): "CHOL", "HDL", "LDLCALC", "TRIG", "CHOLHDL", "LDLDIRECT" in the last 72 hours. Thyroid Function Tests: No results for input(s): "TSH", "T4TOTAL", "FREET4", "T3FREE", "THYROIDAB" in the last 72 hours. Anemia Panel: No results for input(s): "VITAMINB12", "FOLATE", "FERRITIN", "TIBC", "IRON", "RETICCTPCT" in the last 72 hours. Sepsis Labs: Recent Labs  Lab 11/09/22 1947 11/09/22 1956 11/10/22 0107 11/10/22 0531  PROCALCITON <0.10  --   --   --   LATICACIDVEN  --  1.7 2.1* 1.1    Recent Results (from the past 240 hour(s))   Blood culture (routine x 2)     Status: None (Preliminary result)   Collection Time: 11/09/22  2:48 PM   Specimen: BLOOD  Result Value Ref Range Status   Specimen Description BLOOD RIGHT ANTECUBITAL  Final   Special Requests   Final    BOTTLES DRAWN AEROBIC AND ANAEROBIC Blood Culture results may not be optimal due to an inadequate volume of blood received in culture bottles   Culture   Final    NO GROWTH < 24 HOURS Performed at [facility], [address] [place], [place] [address]    Report Status PENDING  Incomplete  Resp Panel by RT-PCR (Flu A&B, Covid) Anterior Nasal Swab     Status: None   Collection Time: 11/09/22  5:44 PM   Specimen: Anterior Nasal Swab  Result Value Ref Range Status   SARS Coronavirus 2 by RT PCR NEGATIVE NEGATIVE Final  Comment: (NOTE) SARS-CoV-2 target nucleic acids are NOT DETECTED.  The SARS-CoV-2 RNA is generally detectable in upper respiratory specimens during the acute phase of infection. The lowest concentration of SARS-CoV-2 viral copies this assay can detect is 138 copies/mL. A negative result does not preclude SARS-Cov-2 infection and should not be used as the sole basis for treatment or other patient management decisions. A negative result may occur with  improper specimen collection/handling, submission of specimen other than nasopharyngeal swab, presence of viral mutation(s) within the areas targeted by this assay, and inadequate number of viral copies(<138 copies/mL). A negative result must be combined with clinical observations, patient history, and epidemiological information. The expected result is Negative.  Fact Sheet for Patients:  EntrepreneurPulse.com.au  Fact Sheet for Healthcare Providers:  IncredibleEmployment.be  This test is no t yet approved or cleared by the [place] FDA and  has been authorized for detection and/or diagnosis of SARS-CoV-2 by FDA under an Emergency  Use Authorization (EUA). This EUA will remain  in effect (meaning this test can be used) for the duration of the COVID-19 declaration under Section 564(b)(1) of the Act, 21 U.S.C.section 360bbb-3(b)(1), unless the authorization is terminated  or revoked sooner.       Influenza A by PCR NEGATIVE NEGATIVE Final   Influenza B by PCR NEGATIVE NEGATIVE Final    Comment: (NOTE) The Xpert Xpress SARS-CoV-2/FLU/RSV plus assay is intended as an aid in the diagnosis of influenza from Nasopharyngeal swab specimens and should not be used as a sole basis for treatment. Nasal washings and aspirates are unacceptable for Xpert Xpress SARS-CoV-2/FLU/RSV testing.  Fact Sheet for Patients: EntrepreneurPulse.com.au  Fact Sheet for Healthcare Providers: IncredibleEmployment.be  This test is not yet approved or cleared by the [place] FDA and has been authorized for detection and/or diagnosis of SARS-CoV-2 by FDA under an Emergency Use Authorization (EUA). This EUA will remain in effect (meaning this test can be used) for the duration of the COVID-19 declaration under Section 564(b)(1) of the Act, 21 U.S.C. section 360bbb-3(b)(1), unless the authorization is terminated or revoked.  Performed at [facility], [address] [place], [place] [address]   Blood culture (routine x 2)     Status: None (Preliminary result)   Collection Time: 11/10/22  1:07 AM   Specimen: BLOOD  Result Value Ref Range Status   Specimen Description BLOOD RIGHT HAND  Final   Special Requests   Final    BOTTLES DRAWN AEROBIC AND ANAEROBIC Blood Culture adequate volume   Culture   Final    NO GROWTH < 12 HOURS Performed at [facility], [address] [place], [place] [address]    Report Status PENDING  Incomplete         Radiology Studies: MR BRAIN WO CONTRAST  Result Date: 11/09/2022 CLINICAL DATA:  Stroke follow-up EXAM: MRI HEAD WITHOUT CONTRAST  TECHNIQUE: Multiplanar, multiecho pulse sequences of the brain and surrounding structures were obtained without intravenous contrast. COMPARISON:  None Available. FINDINGS: Brain: No acute infarct, mass effect or extra-axial collection. No acute or chronic hemorrhage. There is multifocal hyperintense T2-weighted signal within the white matter. Generalized volume loss. The midline structures are normal. Vascular: Major flow voids are preserved. Skull and upper cervical spine: Normal calvarium and skull base. Visualized upper cervical spine and soft tissues are normal. Sinuses/Orbits:Fluid in the right maxillary and sphenoid sinuses. No mastoid or middle ear effusion. Normal orbits. IMPRESSION: 1. No acute intracranial abnormality. 2. Findings of chronic small vessel ischemia  and volume loss. Electronically Signed   By: Ulyses Jarred M.D.   On: 11/09/2022 19:02   CT CHEST ABDOMEN PELVIS W CONTRAST  Result Date: 11/09/2022 CLINICAL DATA:  Blunt trauma. Patient found down for unknown period of time. Uncooperative patient. EXAM: CT CHEST, ABDOMEN, AND PELVIS WITH CONTRAST TECHNIQUE: Multidetector CT imaging of the chest, abdomen and pelvis was performed following the standard protocol during bolus administration of intravenous contrast. RADIATION DOSE REDUCTION: This exam was performed according to the departmental dose-optimization program which includes automated exposure control, adjustment of the mA and/or kV according to patient size and/or use of iterative reconstruction technique. CONTRAST:  7m OMNIPAQUE IOHEXOL 300 MG/ML  SOLN COMPARISON:  None Available. FINDINGS: CT CHEST FINDINGS Cardiovascular: No significant vascular findings. Normal heart size. No pericardial effusion. Mediastinum/Nodes: No axillary or supraclavicular adenopathy. No mediastinal or hilar adenopathy. No pericardial fluid. Esophagus normal. Lungs/Pleura: No pneumothorax. Pulmonary contusion. No pleural fluid. No pneumonia. Biapical  pleuroparenchymal thickening favored benign. Centrilobular emphysema the upper lobes. Musculoskeletal: Healed posterior rib fractures. No acute rib fracture. CT ABDOMEN AND PELVIS FINDINGS Hepatobiliary: No hepatic laceration. Postcholecystectomy. Mild intra and extra hepatic biliary duct dilatation is favored chronic. Pancreas: Pancreas is normal. No ductal dilatation. No pancreatic inflammation. Spleen: No splenic injury. Adrenals/urinary tract: Thickening LEFT adrenal gland to 2 cm (image 51/4. Washout of contrast (greater than 40%) on delayed imaging indicates benign adrenal adenoma. Bilateral renal cortical thinning. Ureters normal. Bladder intact Stomach/Bowel: The stomach, duodenum, and small bowel normal. The colon and rectosigmoid colon are normal. Vascular/Lymphatic: Abdominal aorta is normal caliber with atherosclerotic calcification. There is no retroperitoneal or periportal lymphadenopathy. No pelvic lymphadenopathy. Reproductive: Post hysterectomy.  Adnexa unremarkable Other: No free fluid. Musculoskeletal: Superior endplate compression fracture at L2. There is sclerosis along the depressed fracture plane. No paraspinal hematoma evident. Approximately 26% loss vertebral body height centrally (image 84/8 and image 52/7). No significant retropulsion. IMPRESSION: Chest Impression: 1. No evidence of thoracic trauma. 2. Bilateral most rib fractures. 3. Biapical chronic pleuroparenchymal scarring. Abdomen / Pelvis Impression: 1. Age-indeterminate compression fracture at L2. Proximally 20% loss vertebral body height. No significant retropulsion. 2. Bilateral renal cortical thinning. 3. Mild biliary duct dilatation following cholecystectomy. Consider correlation with bilirubin levels. 4. Benign LEFT adrenal adenoma. Electronically Signed   By: SSuzy BouchardM.D.   On: 11/09/2022 16:07   CT Cervical Spine Wo Contrast  Result Date: 11/09/2022 CLINICAL DATA:  Found down EXAM: CT CERVICAL SPINE WITHOUT  CONTRAST TECHNIQUE: Multidetector CT imaging of the cervical spine was performed without intravenous contrast. Multiplanar CT image reconstructions were also generated. RADIATION DOSE REDUCTION: This exam was performed according to the departmental dose-optimization program which includes automated exposure control, adjustment of the mA and/or kV according to patient size and/or use of iterative reconstruction technique. COMPARISON:  11/01/2022 FINDINGS: Alignment: Normal. Skull base and vertebrae: No acute fracture. No primary bone lesion or focal pathologic process. Soft tissues and spinal canal: No prevertebral fluid or swelling. No visible canal hematoma. Disc levels: Multilevel degenerative disc disease noted with loss of disc space and marginal osteophytes from the C3 through the C7 levels. There is osteoarthritis identified at C1-C2. Facet joint degenerative changes noted most severely at C3-4 on the right and bilaterally at C4-5. Upper chest: Negative. Other: None. IMPRESSION: Multilevel degenerative changes.  No acute traumatic abnormalities. Electronically Signed   By: JSammie BenchM.D.   On: 11/09/2022 15:55   CT Head Wo Contrast  Result Date: 11/09/2022 CLINICAL DATA:  Mental status  change, unknown cause EXAM: CT HEAD WITHOUT CONTRAST TECHNIQUE: Contiguous axial images were obtained from the base of the skull through the vertex without intravenous contrast. RADIATION DOSE REDUCTION: This exam was performed according to the departmental dose-optimization program which includes automated exposure control, adjustment of the mA and/or kV according to patient size and/or use of iterative reconstruction technique. COMPARISON:  11/01/2022 FINDINGS: Brain: There is periventricular white matter decreased attenuation consistent with small vessel ischemic changes. Ventricles, sulci and cisterns are prominent consistent with age related involutional changes. No acute intracranial hemorrhage, mass effect or  shift. No hydrocephalus. Vascular: No hyperdense vessel or unexpected calcification. Skull: Normal. Negative for fracture or focal lesion. Sinuses/Orbits: No acute finding. IMPRESSION: Atrophy and chronic small vessel ischemic changes. No acute intracranial process identified. Electronically Signed   By: Sammie Bench M.D.   On: 11/09/2022 15:53   DG Chest 1 View  Result Date: 11/09/2022 CLINICAL DATA:  Altered mental status EXAM: CHEST  1 VIEW COMPARISON:  10/29/2022 FINDINGS: Remote proximal left humerus fracture. Remote posterior upper left rib fractures. Patient rotated to the right. Midline trachea. Normal heart size. Mild right hemidiaphragm elevation. No pleural effusion or pneumothorax. Bilateral breast implants, projecting over the lungs. No lobar consolidation. IMPRESSION: No acute cardiopulmonary disease. Electronically Signed   By: Abigail Miyamoto M.D.   On: 11/09/2022 15:20        Scheduled Meds:  apixaban  5 mg Oral BID   digoxin  125 mcg Oral Daily   folic acid  1 mg Oral Daily   lidocaine  1 patch Transdermal Q24H   metoprolol tartrate  5 mg Intravenous Once   metoprolol tartrate  50 mg Oral BID   potassium chloride  40 mEq Oral BID   rosuvastatin  10 mg Oral Daily   [START ON 11/14/2022] Vitamin D (Ergocalciferol)  50,000 Units Oral Q Sun   Continuous Infusions:  cefTRIAXone (ROCEPHIN)  IV Stopped (11/09/22 2031)   dextrose 5% lactated ringers Stopped (11/09/22 1946)     LOS: 0 days    Time spent: 35 minutes    Barb Merino, MD [facility] Pager [phone]

## 2022-11-10 NOTE — Plan of Care (Signed)
  Problem: Health Behavior/Discharge Planning: Goal: Ability to manage health-related needs will improve Outcome: Progressing   Problem: Clinical Measurements: Goal: Diagnostic test results will improve Outcome: Progressing   

## 2022-11-10 NOTE — ED Notes (Signed)
[facility] called to ensure TLSO brace order was placed prior and confirmed that order is in.

## 2022-11-10 NOTE — ED Notes (Signed)
Informed RN bed assigned 

## 2022-11-10 NOTE — ED Notes (Signed)
The pt's physician came down to access the pt and verbally ordered me to remove the pt's back brace and mittens. Back brace and mittens were removed per order.

## 2022-11-10 NOTE — ED Notes (Signed)
Attending notified via secure chat that pt c/o 10/10 back pain. Awaiting orders.

## 2022-11-10 NOTE — Consult Note (Addendum)
Consultation Note Date: 11/10/2022   Patient Name: Donna Evans  DOB: Mar 13, 1946  MRN: [id number]  Age / Sex: 76 y.o., female  PCP: Jodi Marble, MD Referring Physician: Barb Merino, MD  Reason for Consultation: Establishing goals of care  HPI/Patient Profile: 76 year old female with history of A-fib on Eliquis, hypertension, hyperlipidemia, insomnia brought to the emergency department with confusion and fall at home, complaining of back pain.  In the emergency room hemodynamically stable.  On room air.  UA was abnormal otherwise nonfocal exam.  Patient did complain of abdominal pain with dysuria. Recent multiple hospitalization, recently discharged on 11/21 where she was admitted with confusion and fall with negative work-up.  Lives alone with some caretaker support.    Clinical Assessment and Goals of Care: Notes, diagnostics and labs reviewed.  In to see patient.  Her niece is at bedside along with patient's significant other.  Patient states that she lives at home.  She states that she is widowed and had a child who died.  She states that her niece and nephew would be her surrogate decision makers.  She discusses that she has both home health and a private duty staff service that comes and for her care needs, and the care of her home, as she is unable to cook and clean any longer.  She states that it causes her back pain at baseline to do some of the movements that are needed to clean.   Family discusses that she has had numerous falls and episodes of "passing out" with standing and with sitting on the couch.  Patient is very clear regarding her DNR status.  She states she does not want to leave the home but would be amenable to rehab if needed in order to regain strength and safety so that she would be able to go home and live long-term.  PMT will continue to follow.  SUMMARY OF RECOMMENDATIONS    Patient confirms DNR status Patient is amenable to skilled nursing placement temporarily, with a long-term goal of getting back home and having functional independence.  Patient's right pinky is bruised from a fall, she states she has pain in that finger.  Family would like x-ray of right hand.  PMT will follow-up tomorrow.       Primary Diagnoses: Present on Admission:  (Resolved) Altered mental status  Syncope  Paroxysmal atrial fibrillation with RVR (HCC)  Memory change  HLD (hyperlipidemia)  Hypertension, benign  Fibromyalgia syndrome  Generalized anxiety disorder  Chronic pain syndrome  Acute metabolic encephalopathy  Encephalopathy   I have reviewed the medical record, interviewed the patient and family, and examined the patient. The following aspects are pertinent.  Past Medical History:  Diagnosis Date   Allergic state    Anginal pain ([place])    Prinzmetal's angina   Anxiety    Arthritis    osteoarthritis   Breast cancer ([place]) 1990   right breast cancer   Chronic pain    Chronic pain    COPD (chronic obstructive pulmonary  disease) ([place])    Depression    Edema    Fibromyalgia    Foot fracture    Bilateral   GERD (gastroesophageal reflux disease)    History of kidney stones    Hypertension    Leg fracture, right    Low back pain    Lumbosacral neuritis    Medulloadrenal hyperfunction (HCC)    Migraine headache    Peripheral neuropathy    Shoulder fracture, right    Stroke [facility])    TIA   Systemic lupus erythematosus (HCC)    Thyroid disease    TIA (transient ischemic attack) 11/28/2013   Transient global amnesia 2011   Social History   Socioeconomic History   Marital status: Married    Spouse name: Not on file   Number of children: 1   Years of education: college3   Highest education level: Not on file  Occupational History   Occupation: Retired  Tobacco Use   Smoking status: Former    Packs/day: 1.00    Years: 40.00    Total pack  years: 40.00    Types: Cigarettes    Quit date: 08/28/2005    Years since quitting: 17.2   Smokeless tobacco: Never  Vaping Use   Vaping Use: Never used  Substance and Sexual Activity   Alcohol use: Not Currently   Drug use: No   Sexual activity: Never  Other Topics Concern   Not on file  Social History Narrative   Not on file   Social Determinants of Health   Financial Resource Strain: Not on file  Food Insecurity: No Food Insecurity (11/10/2022)   Hunger Vital Sign    Worried About Running Out of Food in the Last Year: Never true    Ran Out of Food in the Last Year: Never true  Transportation Needs: No Transportation Needs (11/10/2022)   PRAPARE - Hydrologist (Medical): No    Lack of Transportation (Non-Medical): No  Physical Activity: Not on file  Stress: Not on file  Social Connections: Not on file   Family History  Problem Relation Age of Onset   Atrial fibrillation Mother    Atrial fibrillation Sister    Cancer Sister    Diabetes Sister    Breast cancer Sister 68   Diabetes Father    Cancer Sister    Diabetes Sister    Atrial fibrillation Sister    Kidney disease Maternal Aunt    Scheduled Meds:  apixaban  5 mg Oral BID   digoxin  125 mcg Oral Daily   folic acid  1 mg Oral Daily   lidocaine  1 patch Transdermal Q24H   metoprolol tartrate  5 mg Intravenous Once   metoprolol tartrate  50 mg Oral BID   potassium chloride  40 mEq Oral BID   rosuvastatin  10 mg Oral Daily   [START ON 11/14/2022] Vitamin D (Ergocalciferol)  50,000 Units Oral Q Sun   Continuous Infusions:  cefTRIAXone (ROCEPHIN)  IV Stopped (11/09/22 2031)   dextrose 5% lactated ringers Stopped (11/09/22 1946)   PRN Meds:.acetaminophen **OR** acetaminophen, alum & mag hydroxide-simeth, HYDROcodone-acetaminophen, nicotine, ondansetron **OR** ondansetron (ZOFRAN) IV, zolpidem Medications Prior to Admission:  Prior to Admission medications   Medication Sig Start  Date End Date Taking? Authorizing Provider  alum & mag hydroxide-simeth (MAALOX/MYLANTA) 200-200-20 MG/5ML suspension Take 15 mLs by mouth every 6 (six) hours as needed for indigestion or heartburn.   Yes [provider]  apixaban (  ELIQUIS) 5 MG TABS tablet Take 1 tablet (5 mg total) by mouth 2 (two) times daily. 10/18/22  Yes Emeterio Reeve, DO  Cyanocobalamin (VITAMIN B-12 IJ) Inject 1,000 mg as directed every 30 (thirty) days.   Yes [provider]  cyclobenzaprine (FLEXERIL) 10 MG tablet Take 10 mg by mouth 3 (three) times daily. 07/23/22  Yes [provider]  digoxin (LANOXIN) 0.125 MG tablet Take 1 tablet (125 mcg total) by mouth daily for 7 days. 11/02/22 11/09/22 Yes Sharen Hones, MD  folic acid (FOLVITE) 1 MG tablet Take 1 tablet (1 mg total) by mouth daily. 10/18/22  Yes Emeterio Reeve, DO  metoprolol tartrate (LOPRESSOR) 50 MG tablet Take 1 tablet (50 mg total) by mouth 2 (two) times daily. 10/18/22  Yes Emeterio Reeve, DO  rosuvastatin (CRESTOR) 10 MG tablet Take 10 mg by mouth daily. 02/24/22  Yes [provider]  Vitamin D, Ergocalciferol, 50000 units CAPS Take 1 capsule by mouth once a week. 07/23/22  Yes [provider]  zolpidem (AMBIEN) 10 MG tablet Take 0.5 tablets (5 mg total) by mouth at bedtime. 10/18/22  Yes Emeterio Reeve, DO  calcium carbonate (TUMS - DOSED IN MG ELEMENTAL CALCIUM) 500 MG chewable tablet Chew 1 tablet by mouth 3 (three) times daily as needed for indigestion or heartburn.    [provider]   Allergies  Allergen Reactions   Ciprofloxacin Nausea And Vomiting and Hives   Codeine Anaphylaxis and Hives    Reaction:  Unknown    Demerol [Meperidine] Anaphylaxis and Hives    Reaction:  Unknown     Fluconazole Hives   Latex Anaphylaxis and Rash   Morphine And Related Hives   Doxycycline Nausea And Vomiting   Flagyl [Metronidazole] Nausea And Vomiting    Reaction:  Unknown    Influenza Vaccines  Swelling    Localized swelling   Tape Other (See Comments)    Pt allergic to Adhesive tape and latex.(  Skin breaks out)   Valtrex [Valacyclovir Hcl] Hives   Buprenorphine     Other reaction(s): Other (see comments) unknown   Duloxetine Hcl Other (See Comments)    Reaction:  Unknown    Tramadol     Other reaction(s): Not available   Amoxicillin-Pot Clavulanate Nausea And Vomiting   Sulfamethoxazole-Trimethoprim Nausea And Vomiting   Valacyclovir Rash   Review of Systems  Musculoskeletal:        Back pain.  Right pinky pain.    Physical Exam Pulmonary:     Effort: Pulmonary effort is normal.  Neurological:     Mental Status: She is alert.     Vital Signs: BP (!) 148/72   Pulse (!) 101   Temp 97.8 F (36.6 C) (Oral)   Resp 15   Ht '5\' 7"'$  (1.702 m)   Wt 50.8 kg   SpO2 100%   BMI 17.54 kg/m  Pain Scale: 0-10   Pain Score: 10-Worst pain ever   SpO2: SpO2: 100 % O2 Device:SpO2: 100 % O2 Flow Rate: .   IO: Intake/output summary:  Intake/Output Summary (Last 24 hours) at 11/10/2022 1643 Last data filed at 11/09/2022 2031 Gross per 24 hour  Intake 2100 ml  Output --  Net 2100 ml    LBM:   Baseline Weight: Weight: 50.8 kg Most recent weight: Weight: 50.8 kg      Signed by: Asencion Gowda, NP   Please contact Palliative Medicine Team phone at [phone] for questions and concerns.  For individual provider: See  Amion

## 2022-11-10 NOTE — ED Notes (Signed)
Pt calmly laying on bed, watching tv, in NAD.

## 2022-11-10 NOTE — ED Notes (Signed)
Patient is more alert but confused. Patient is attempting to elope from bed and thinks she has been discharged to go home. Patient haws been reoriented multiple times. Adjusted environment and administered Ambien.

## 2022-11-10 NOTE — Progress Notes (Signed)
       CROSS COVER NOTE  NAME: Donna Evans MRN: [id number] DOB : 14-Jan-1946 ATTENDING PHYSICIAN: Evans, Donna N, DO    Date of Service   11/10/2022   HPI/Events of Note   Notified of lactic increase from 1.7-->2.1.   [name] is a 76 year old female with history of atrial fibrillation Eliquis, hypertension, hyperlipidemia, insomnia, who presents to the emergency department for chief concerns of altered mental status. She endorsed abdominal pain and dysuria.   Interventions   Assessment/Plan:  500 mL LR bolus Repeat Lactic at 0500  Continue Ceftriaxone Follow culture data    This document was prepared using Dragon voice recognition software and may include unintentional dictation errors.  Donna Glass DNP, MBA, FNP-BC Nurse Practitioner [facility] Pager [phone]

## 2022-11-10 NOTE — Evaluation (Signed)
Occupational Therapy Evaluation Patient Details Name: Donna Evans MRN: [id number] DOB: 09-Oct-1946 Today's Date: 11/10/2022   History of Present Illness 76 year old female with history of A-fib on Eliquis, hypertension, hyperlipidemia, insomnia brought to the emergency department with confusion and fall at home, complaining of back pain. Found to have chronic L2 compression fx.   Clinical Impression   Donna Evans was seen for OT evaluation this date. Prior to hospital admission, pt was MOD I for mobility and ADLs. Pt lives alone. Pt presents to acute OT demonstrating impaired ADL performance and functional mobility 2/2 decreased activity tolerance and functional strength/balance deficits. Pt currently requires MOD I don/doff B socks seated EOB. MIN A + RW for toilet t/f, assist for multiple LOBs, difficulty navigating RW around objects. Pt abandons RW in restroom and requires MOD A to prevent LOB. MIN cues hand washing, cues to sequence. MIN A don gown in standing. Pt would benefit from skilled OT to address noted impairments and functional limitations (see below for any additional details). Upon hospital discharge, recommend STR as pt is a high fall risk and lives alone.    Recommendations for follow up therapy are one component of a multi-disciplinary discharge planning process, led by the attending physician.  Recommendations may be updated based on patient status, additional functional criteria and insurance authorization.   Follow Up Recommendations  Skilled nursing-short term rehab (<3 hours/day)     Assistance Recommended at Discharge Frequent or constant Supervision/Assistance  Patient can return home with the following A little help with walking and/or transfers;A little help with bathing/dressing/bathroom;Help with stairs or ramp for entrance;Direct supervision/assist for medications management    Functional Status Assessment  Patient has had a recent decline in their functional  status and demonstrates the ability to make significant improvements in function in a reasonable and predictable amount of time.  Equipment Recommendations  None recommended by OT    Recommendations for Other Services       Precautions / Restrictions Precautions Precautions: Fall Required Braces or Orthoses: Spinal Brace Spinal Brace: Thoracolumbosacral orthotic (per chart for comfort) Restrictions Weight Bearing Restrictions: No      Mobility Bed Mobility Overal bed mobility: Needs Assistance Bed Mobility: Sit to Supine       Sit to supine: Supervision        Transfers Overall transfer level: Needs assistance Equipment used: Rolling walker (2 wheels) Transfers: Sit to/from Stand Sit to Stand: Min guard                  Balance Overall balance assessment: Needs assistance Sitting-balance support: No upper extremity supported, Feet supported Sitting balance-Leahy Scale: Good     Standing balance support: Bilateral upper extremity supported, During functional activity Standing balance-Leahy Scale: Fair                             ADL either performed or assessed with clinical judgement   ADL Overall ADL's : Needs assistance/impaired                                       General ADL Comments: MOD I don/doff B socks seated EOB. MIN A + RW for toilet t/f, assist for multiple LOBs, difficulty navigating RW around objects. Pt abandons RW in restroom and requires MOD A to prevent LOB. MIN cues hand washing, cues to sequence. MIN A  don gown in standing.      Pertinent Vitals/Pain Pain Assessment Pain Assessment: No/denies pain     Hand Dominance     Extremity/Trunk Assessment Upper Extremity Assessment Upper Extremity Assessment: Overall WFL for tasks assessed   Lower Extremity Assessment Lower Extremity Assessment: Generalized weakness       Communication Communication Communication: No difficulties   Cognition  Arousal/Alertness: Awake/alert Behavior During Therapy: WFL for tasks assessed/performed Overall Cognitive Status: Impaired/Different from baseline Area of Impairment: Following commands, Safety/judgement, Problem solving                       Following Commands: Follows one step commands with increased time Safety/Judgement: Decreased awareness of safety, Decreased awareness of deficits   Problem Solving: Slow processing, Decreased initiation, Difficulty sequencing, Requires verbal cues, Requires tactile cues                  Home Living Family/patient expects to be discharged to:: Private residence Living Arrangements: Alone Available Help at Discharge: Family;Available PRN/intermittently;Friend(s) Type of Home: House Home Access: Stairs to enter [employer] of Steps: 1 (threshold level) Entrance Stairs-Rails: None Home Layout: One level     Bathroom Shower/Tub: Occupational psychologist: Handicapped height     Home Equipment: Shower seat;Grab bars - toilet;Rollator (4 wheels);Cane - single Barista (2 wheels)   Additional Comments: reports HHA 4 days/week ~6 hours. Friend takes care of her dog      Prior Functioning/Environment Prior Level of Function : Needs assist;History of Falls (last six months)             Mobility Comments: 4WW, hx of ~4 falls in 3 months ADLs Comments: assist for IADLs        OT Problem List: Decreased strength;Decreased activity tolerance;Impaired balance (sitting and/or standing);Decreased safety awareness      OT Treatment/Interventions: Self-care/ADL training;Therapeutic exercise;Energy conservation;DME and/or AE instruction;Therapeutic activities;Patient/family education;Balance training;Cognitive remediation/compensation    OT Goals(Current goals can be found in the care plan section) Acute Rehab OT Goals Patient Stated Goal: to go home OT Goal Formulation: With patient/family Time For  Goal Achievement: 11/24/22 Potential to Achieve Goals: Fair ADL Goals Pt Will Perform Grooming: with modified independence;standing Pt Will Perform Lower Body Dressing: with modified independence;sit to/from stand Pt Will Transfer to Toilet: with modified independence;ambulating;regular height toilet  OT Frequency: Min 2X/week    Co-evaluation              AM-PAC OT "6 Clicks" Daily Activity     Outcome Measure Help from another person eating meals?: A Little Help from another person taking care of personal grooming?: A Little Help from another person toileting, which includes using toliet, bedpan, or urinal?: A Lot Help from another person bathing (including washing, rinsing, drying)?: A Little Help from another person to put on and taking off regular upper body clothing?: A Little Help from another person to put on and taking off regular lower body clothing?: A Little 6 Click Score: 17   End of Session Equipment Utilized During Treatment: Rolling walker (2 wheels) Nurse Communication: Mobility status  Activity Tolerance: Patient tolerated treatment well Patient left: in bed;with call bell/phone within reach;with bed alarm set;with family/visitor present  OT Visit Diagnosis: Unsteadiness on feet (R26.81)                Time: 1522-1600 OT Time Calculation (min): 38 min Charges:  OT General Charges $OT Visit: 1 Visit OT Evaluation $OT  Eval Moderate Complexity: 1 Mod OT Treatments $Self Care/Home Management : 23-37 mins  Dessie Coma, M.S. OTR/L  11/10/22, 4:24 PM  ascom [phone]

## 2022-11-10 NOTE — Progress Notes (Signed)
Orthopedic Tech Progress Note Patient Details:  Donna Evans [birth year] [id number]  Ortho Devices Type of Ortho Device: Thoracolumbar corset (TLSO) Ortho Device/Splint Location: Back Ortho Device/Splint Interventions: Ordered      Edwina Barth 11/10/2022, 4:56 AM

## 2022-11-10 NOTE — Plan of Care (Signed)
  Problem: Health Behavior/Discharge Planning: Goal: Ability to manage health-related needs will improve Outcome: Progressing   

## 2022-11-11 ENCOUNTER — Inpatient Hospital Stay: Payer: PPO

## 2022-11-11 DIAGNOSIS — E785 Hyperlipidemia, unspecified: Secondary | ICD-10-CM | POA: Diagnosis not present

## 2022-11-11 DIAGNOSIS — N189 Chronic kidney disease, unspecified: Secondary | ICD-10-CM | POA: Diagnosis not present

## 2022-11-11 DIAGNOSIS — I129 Hypertensive chronic kidney disease with stage 1 through stage 4 chronic kidney disease, or unspecified chronic kidney disease: Secondary | ICD-10-CM | POA: Diagnosis not present

## 2022-11-11 DIAGNOSIS — G9341 Metabolic encephalopathy: Secondary | ICD-10-CM | POA: Diagnosis not present

## 2022-11-11 LAB — URINE CULTURE

## 2022-11-11 MED ORDER — ENSURE ENLIVE PO LIQD
237.0000 mL | Freq: Two times a day (BID) | ORAL | Status: DC
  Administered 2022-11-12 – 2022-11-16 (×2): 237 mL via ORAL

## 2022-11-11 MED ORDER — ADULT MULTIVITAMIN W/MINERALS CH
1.0000 | ORAL_TABLET | Freq: Every day | ORAL | Status: DC
  Administered 2022-11-12 – 2022-11-18 (×7): 1 via ORAL
  Filled 2022-11-11 (×7): qty 1

## 2022-11-11 MED ORDER — CYCLOBENZAPRINE HCL 10 MG PO TABS
5.0000 mg | ORAL_TABLET | Freq: Three times a day (TID) | ORAL | Status: DC | PRN
  Administered 2022-11-11 – 2022-11-13 (×5): 5 mg via ORAL
  Filled 2022-11-11 (×6): qty 1

## 2022-11-11 NOTE — Evaluation (Addendum)
Physical Therapy Evaluation Patient Details Name: Donna Evans MRN: [id number] DOB: 08-27-1946 Today's Date: 11/11/2022  History of Present Illness  76 year old female with history of A-fib on Eliquis, hypertension, hyperlipidemia, insomnia brought to the emergency department with confusion and fall at home, complaining of back pain. Found to have chronic L2 compression fx.  Clinical Impression  Pt is a pleasant 76 year old female who was admitted for acute metabolic encephalopathy. Pt performs bed mobility with cga, transfers with supervision, and ambulation with cga and RW. Pt demonstrates deficits with pain/mobility. Would benefit from skilled PT to address above deficits and promote optimal return to PLOF. TLSO donned for all mobility and provided pain relief. Recommend transition to [facility] upon discharge from acute hospitalization. Due to multiple admissions and multiple falls, may consider recommendation for ALF for increased supervision/safety.      Recommendations for follow up therapy are one component of a multi-disciplinary discharge planning process, led by the attending physician.  Recommendations may be updated based on patient status, additional functional criteria and insurance authorization.  Follow Up Recommendations Home health PT Can patient physically be transported by private vehicle: Yes    Assistance Recommended at Discharge Intermittent Supervision/Assistance  Patient can return home with the following  A little help with walking and/or transfers;A little help with bathing/dressing/bathroom;Assistance with cooking/housework;Help with stairs or ramp for entrance;Assist for transportation    Equipment Recommendations Rollator (4 wheels) (currently rollator broken)  Recommendations for Other Services       Functional Status Assessment Patient has had a recent decline in their functional status and demonstrates the ability to make significant improvements in function in  a reasonable and predictable amount of time.     Precautions / Restrictions Precautions Precautions: Fall Required Braces or Orthoses: Spinal Brace Spinal Brace: Thoracolumbosacral orthotic Restrictions Weight Bearing Restrictions: No      Mobility  Bed Mobility Overal bed mobility: Needs Assistance Bed Mobility: Supine to Sit     Supine to sit: Min guard     General bed mobility comments: cues for log roll technique. Once seated at EOB, upright posture. Donned TLSO at EOB and provided education regarding use    Transfers Overall transfer level: Needs assistance Equipment used: Rolling walker (2 wheels) Transfers: Sit to/from Stand Sit to Stand: Supervision           General transfer comment: safe technique. No cues required    Ambulation/Gait Ambulation/Gait assistance: Min guard Gait Distance (Feet): 200 Feet Assistive device: Rolling walker (2 wheels) Gait Pattern/deviations: Step-through pattern       General Gait Details: ambulated with improved gait pattern and good speed. No issues with running into obstacles this session and pt able to carry conversation with exertion.  Stairs            Wheelchair Mobility    Modified Rankin (Stroke Patients Only)       Balance Overall balance assessment: Needs assistance Sitting-balance support: No upper extremity supported, Feet supported Sitting balance-Leahy Scale: Good     Standing balance support: Bilateral upper extremity supported, During functional activity Standing balance-Leahy Scale: Fair                               Pertinent Vitals/Pain Pain Assessment Pain Assessment: Faces Faces Pain Scale: Hurts a little bit Pain Location: R side/low back Pain Descriptors / Indicators: Aching Pain Intervention(s): Limited activity within patient's tolerance, Monitored during session, Repositioned  Home Living Family/patient expects to be discharged to:: Private residence Living  Arrangements: Alone Available Help at Discharge: Family;Available PRN/intermittently;Friend(s) Type of Home: House Home Access: Stairs to enter Entrance Stairs-Rails: None Entrance Stairs-Number of Steps: 1   Home Layout: One level Home Equipment: Shower seat;Grab bars - toilet;Rollator (4 wheels);Cane - single Barista (2 wheels) Additional Comments: reports HHA 4 days/week ~6 hours. Friend takes care of her dog    Prior Function Prior Level of Function : Needs assist;History of Falls (last six months)             Mobility Comments: 4WW, hx of ~4 falls in 3 months. Reports furniture cruising as rollator doesn't have functional breaks. Doesn't like RW ADLs Comments: assist for IADLs     Hand Dominance        Extremity/Trunk Assessment   Upper Extremity Assessment Upper Extremity Assessment: Overall WFL for tasks assessed    Lower Extremity Assessment Lower Extremity Assessment: Overall WFL for tasks assessed       Communication   Communication: No difficulties  Cognition Arousal/Alertness: Awake/alert Behavior During Therapy: WFL for tasks assessed/performed Overall Cognitive Status: Within Functional Limits for tasks assessed                                 General Comments: alert x 4        General Comments      Exercises Other Exercises Other Exercises: ambulated to bathroom and able to transfer from low toilet with supervision, perform self hygiene and don underwear. Pt then able to wash hands with safe technique.   Assessment/Plan    PT Assessment Patient needs continued PT services  PT Problem List Decreased strength;Decreased activity tolerance;Decreased balance;Decreased mobility;Decreased knowledge of use of DME;Decreased safety awareness;Decreased knowledge of precautions;Pain       PT Treatment Interventions DME instruction;Gait training;Stair training;Functional mobility training;Therapeutic activities;Therapeutic  exercise;Balance training;Patient/family education    PT Goals (Current goals can be found in the Care Plan section)  Acute Rehab PT Goals Patient Stated Goal: to improve mobility and balance PT Goal Formulation: With patient Time For Goal Achievement: 11/25/22 Potential to Achieve Goals: Good    Frequency Min 2X/week     Co-evaluation               AM-PAC PT "6 Clicks" Mobility  Outcome Measure Help needed turning from your back to your side while in a flat bed without using bedrails?: None Help needed moving from lying on your back to sitting on the side of a flat bed without using bedrails?: None Help needed moving to and from a bed to a chair (including a wheelchair)?: A Little Help needed standing up from a chair using your arms (e.g., wheelchair or bedside chair)?: None Help needed to walk in hospital room?: A Little Help needed climbing 3-5 steps with a railing? : A Little 6 Click Score: 21    End of Session Equipment Utilized During Treatment: Gait belt Activity Tolerance: Patient tolerated treatment well Patient left: in chair Nurse Communication: Mobility status PT Visit Diagnosis: Other abnormalities of gait and mobility (R26.89);Muscle weakness (generalized) (M62.81);History of falling (Z91.81);Repeated falls (R29.6);Pain Pain - Right/Left: Right Pain - part of body:  (back)    Time: 4097-3532 PT Time Calculation (min) (ACUTE ONLY): 44 min   Charges:   PT Evaluation $PT Eval Low Complexity: 1 Low PT Treatments $Gait Training: 8-22 mins $Therapeutic Activity: 8-22 mins  Greggory Stallion, PT, DPT, GCS [phone]   Heinz Eckert 11/11/2022, 11:30 AM

## 2022-11-11 NOTE — Progress Notes (Signed)
PROGRESS NOTE    Donna Evans  FKC:[id number] DOB: 02/28/1946 DOA: 11/09/2022 PCP: Jodi Marble, MD    Brief Narrative:  76 year old female with history of A-fib on Eliquis, hypertension, hyperlipidemia, insomnia brought to the emergency department with confusion and fall at home, complaining of back pain.  In the emergency room hemodynamically stable.  On room air.  UA was abnormal otherwise nonfocal exam.  Patient did complain of abdominal pain with dysuria. Recent multiple hospitalization, recently discharged on 11/21 where she was admitted with confusion and fall with negative work-up.  Lives alone with some caretaker support.   Assessment & Plan:  Acute metabolic encephalopathy likely secondary to UTI, likely underlying dementia with exacerbation of symptoms. Mental status improving and normalized. Blood cultures negative. Urine culture with multiple species.  Rocephin for 3 days.  No evidence of urinary obstruction. Nonfocal exam.  MRI brain essentially normal.  Recent MRI brain 1 week ago was normal. Recent extensive work-up including B12, TSH normal. Patient will probably need more supervised living, placement.  Recurrent fall, L2 compression fracture which is probably chronic: No neurological deficit. Patient was given T SLO brace that is making her very uncomfortable.  Her fracture is stable.  Discontinue brace.  Provided with pain medications.  Mobilize with PT OT.  Refer to SNF. Trying to avoid benzodiazepines and opiates, however patient has persistent pain.  She will use Norco 5 mg as needed for pain.  Avoid benzodiazepines to avoid polypharmacy.  CKD stage IIIb, at about baseline.  Paroxysmal A-fib: Currently in sinus rhythm.  Patient on digoxin metoprolol and therapeutic on Eliquis.  Insomnia: Uses Ambien at night.  Trying to avoid benzodiazepines to avoid polypharmacy.   DVT prophylaxis: Place TED hose Start: 11/09/22 1801 apixaban (ELIQUIS) tablet 5 mg    Code Status: Full code Family Communication: Patient's nephew Mr. Ronalee Belts on the phone Disposition Plan: Status is: Inpatient.  Remains on IV antibiotics.  Mobility.   Consultants:  Palliative care  Procedures:  None  Antimicrobials:  Rocephin 11/28----   Subjective: Patient seen and examined.  Today she is more quiet and composed and interactive.  She is alert oriented x4.  When discussed about her home situation, she tells me that she decides for her own health. After much discussion she agrees to consider SNF. C/O persistent back pain. Brace is very uncomfortable and poking on her chin. Patient tells me that she was on Norco, Xanax and Flexeril until last admission. Her mental status is dramatically improved. Nephew was on the phone.  Objective: Vitals:   11/10/22 1846 11/10/22 2032 11/11/22 0313 11/11/22 0802  BP: 129/64 (!) 126/56 (!) 109/92 (!) 137/58  Pulse: 73 74 64 70  Resp: '18 16 15 16  '$ Temp: 97.7 F (36.5 C) 98.1 F (36.7 C) 97.7 F (36.5 C) 98 F (36.7 C)  TempSrc:  Oral Oral Oral  SpO2: 100% 100% 100% 100%  Weight:      Height:        Intake/Output Summary (Last 24 hours) at 11/11/2022 1238 Last data filed at 11/11/2022 0600 Gross per 24 hour  Intake 200 ml  Output --  Net 200 ml    Filed Weights   11/09/22 1427  Weight: 50.8 kg    Examination:  General exam: Appears in mild distress and anxious.  Patient is alert oriented x4.  Does not have any focal neurodeficits.  Attempting to walk with physical therapy. Respiratory system: Clear to auscultation. Respiratory effort normal. Cardiovascular system: S1 &  S2 heard, RRR. No JVD, murmurs, rubs, gallops or clicks. No pedal edema. Gastrointestinal system: Abdomen is nondistended, soft and nontender. No organomegaly or masses felt. Normal bowel sounds heard. Central nervous system: No focal neurological deficits. Extremities: Symmetric 5 x 5 power.  Moves all extremities.    Data Reviewed: I  have personally reviewed following labs and imaging studies  CBC: Recent Labs  Lab 11/09/22 1447 11/10/22 0531  WBC 11.8* 7.2  HGB 13.7 10.6*  HCT 41.3 33.0*  MCV 92.0 94.6  PLT 210 259    Basic Metabolic Panel: Recent Labs  Lab 11/09/22 1447 11/09/22 1947 11/10/22 0531  NA 141  --  144  K 3.6  --  3.1*  CL 110  --  114*  CO2 18*  --  21*  GLUCOSE 98  --  106*  BUN 43*  --  31*  CREATININE 1.35*  --  1.09*  CALCIUM 9.4  --  8.6*  MG  --  2.0  --   PHOS  --  2.9  --     GFR: Estimated Creatinine Clearance: 35.2 mL/min (A) (by C-G formula based on SCr of 1.09 mg/dL (H)). Liver Function Tests: Recent Labs  Lab 11/09/22 1447  AST 47*  ALT 50*  ALKPHOS 913*  BILITOT 1.6*  PROT 8.5*  ALBUMIN 4.1    No results for input(s): "LIPASE", "AMYLASE" in the last 168 hours. No results for input(s): "AMMONIA" in the last 168 hours. Coagulation Profile: No results for input(s): "INR", "PROTIME" in the last 168 hours. Cardiac Enzymes: Recent Labs  Lab 11/09/22 1447  CKTOTAL 111    BNP (last 3 results) No results for input(s): "PROBNP" in the last 8760 hours. HbA1C: No results for input(s): "HGBA1C" in the last 72 hours. CBG: Recent Labs  Lab 11/09/22 1433 11/09/22 1737 11/09/22 1951  GLUCAP 66* 85 201*    Lipid Profile: No results for input(s): "CHOL", "HDL", "LDLCALC", "TRIG", "CHOLHDL", "LDLDIRECT" in the last 72 hours. Thyroid Function Tests: No results for input(s): "TSH", "T4TOTAL", "FREET4", "T3FREE", "THYROIDAB" in the last 72 hours. Anemia Panel: No results for input(s): "VITAMINB12", "FOLATE", "FERRITIN", "TIBC", "IRON", "RETICCTPCT" in the last 72 hours. Sepsis Labs: Recent Labs  Lab 11/09/22 1947 11/09/22 1956 11/10/22 0107 11/10/22 0531  PROCALCITON <0.10  --   --   --   LATICACIDVEN  --  1.7 2.1* 1.1     Recent Results (from the past 240 hour(s))  Blood culture (routine x 2)     Status: None (Preliminary result)   Collection Time:  11/09/22  2:48 PM   Specimen: BLOOD  Result Value Ref Range Status   Specimen Description BLOOD RIGHT ANTECUBITAL  Final   Special Requests   Final    BOTTLES DRAWN AEROBIC AND ANAEROBIC Blood Culture results may not be optimal due to an inadequate volume of blood received in culture bottles   Culture   Final    NO GROWTH 2 DAYS Performed at [facility], [address], [place], [place] [address]    Report Status PENDING  Incomplete  Resp Panel by RT-PCR (Flu A&B, Covid) Anterior Nasal Swab     Status: None   Collection Time: 11/09/22  5:44 PM   Specimen: Anterior Nasal Swab  Result Value Ref Range Status   SARS Coronavirus 2 by RT PCR NEGATIVE NEGATIVE Final    Comment: (NOTE) SARS-CoV-2 target nucleic acids are NOT DETECTED.  The SARS-CoV-2 RNA is generally detectable in upper respiratory specimens during  the acute phase of infection. The lowest concentration of SARS-CoV-2 viral copies this assay can detect is 138 copies/mL. A negative result does not preclude SARS-Cov-2 infection and should not be used as the sole basis for treatment or other patient management decisions. A negative result may occur with  improper specimen collection/handling, submission of specimen other than nasopharyngeal swab, presence of viral mutation(s) within the areas targeted by this assay, and inadequate number of viral copies(<138 copies/mL). A negative result must be combined with clinical observations, patient history, and epidemiological information. The expected result is Negative.  Fact Sheet for Patients:  EntrepreneurPulse.com.au  Fact Sheet for Healthcare Providers:  IncredibleEmployment.be  This test is no t yet approved or cleared by the [place] FDA and  has been authorized for detection and/or diagnosis of SARS-CoV-2 by FDA under an Emergency Use Authorization (EUA). This EUA will remain  in effect (meaning this test can be used)  for the duration of the COVID-19 declaration under Section 564(b)(1) of the Act, 21 U.S.C.section 360bbb-3(b)(1), unless the authorization is terminated  or revoked sooner.       Influenza A by PCR NEGATIVE NEGATIVE Final   Influenza B by PCR NEGATIVE NEGATIVE Final    Comment: (NOTE) The Xpert Xpress SARS-CoV-2/FLU/RSV plus assay is intended as an aid in the diagnosis of influenza from Nasopharyngeal swab specimens and should not be used as a sole basis for treatment. Nasal washings and aspirates are unacceptable for Xpert Xpress SARS-CoV-2/FLU/RSV testing.  Fact Sheet for Patients: EntrepreneurPulse.com.au  Fact Sheet for Healthcare Providers: IncredibleEmployment.be  This test is not yet approved or cleared by the [place] FDA and has been authorized for detection and/or diagnosis of SARS-CoV-2 by FDA under an Emergency Use Authorization (EUA). This EUA will remain in effect (meaning this test can be used) for the duration of the COVID-19 declaration under Section 564(b)(1) of the Act, 21 U.S.C. section 360bbb-3(b)(1), unless the authorization is terminated or revoked.  Performed at [facility], [address], [place], [place] [address]   Urine Culture     Status: Abnormal   Collection Time: 11/09/22  5:50 PM   Specimen: Urine, Random  Result Value Ref Range Status   Specimen Description   Final    URINE, RANDOM Performed at [facility], [address], [place], [place] [address]    Special Requests   Final    NONE Performed at [facility], [address] [place], [place] [address]    Culture MULTIPLE SPECIES PRESENT, SUGGEST RECOLLECTION (A)  Final   Report Status 11/11/2022 FINAL  Final  Blood culture (routine x 2)     Status: None (Preliminary result)   Collection Time: 11/10/22  1:07 AM   Specimen: BLOOD  Result Value Ref Range Status   Specimen Description BLOOD RIGHT HAND  Final    Special Requests   Final    BOTTLES DRAWN AEROBIC AND ANAEROBIC Blood Culture adequate volume   Culture   Final    NO GROWTH 1 DAY Performed at [facility], [address] [place], [place] [address]    Report Status PENDING  Incomplete         Radiology Studies: MR BRAIN WO CONTRAST  Result Date: 11/09/2022 CLINICAL DATA:  Stroke follow-up EXAM: MRI HEAD WITHOUT CONTRAST TECHNIQUE: Multiplanar, multiecho pulse sequences of the brain and surrounding structures were obtained without intravenous contrast. COMPARISON:  None Available. FINDINGS: Brain: No acute infarct, mass effect or extra-axial collection. No acute or  chronic hemorrhage. There is multifocal hyperintense T2-weighted signal within the white matter. Generalized volume loss. The midline structures are normal. Vascular: Major flow voids are preserved. Skull and upper cervical spine: Normal calvarium and skull base. Visualized upper cervical spine and soft tissues are normal. Sinuses/Orbits:Fluid in the right maxillary and sphenoid sinuses. No mastoid or middle ear effusion. Normal orbits. IMPRESSION: 1. No acute intracranial abnormality. 2. Findings of chronic small vessel ischemia and volume loss. Electronically Signed   By: Ulyses Jarred M.D.   On: 11/09/2022 19:02   CT CHEST ABDOMEN PELVIS W CONTRAST  Result Date: 11/09/2022 CLINICAL DATA:  Blunt trauma. Patient found down for unknown period of time. Uncooperative patient. EXAM: CT CHEST, ABDOMEN, AND PELVIS WITH CONTRAST TECHNIQUE: Multidetector CT imaging of the chest, abdomen and pelvis was performed following the standard protocol during bolus administration of intravenous contrast. RADIATION DOSE REDUCTION: This exam was performed according to the departmental dose-optimization program which includes automated exposure control, adjustment of the mA and/or kV according to patient size and/or use of iterative reconstruction technique. CONTRAST:  9m OMNIPAQUE IOHEXOL  300 MG/ML  SOLN COMPARISON:  None Available. FINDINGS: CT CHEST FINDINGS Cardiovascular: No significant vascular findings. Normal heart size. No pericardial effusion. Mediastinum/Nodes: No axillary or supraclavicular adenopathy. No mediastinal or hilar adenopathy. No pericardial fluid. Esophagus normal. Lungs/Pleura: No pneumothorax. Pulmonary contusion. No pleural fluid. No pneumonia. Biapical pleuroparenchymal thickening favored benign. Centrilobular emphysema the upper lobes. Musculoskeletal: Healed posterior rib fractures. No acute rib fracture. CT ABDOMEN AND PELVIS FINDINGS Hepatobiliary: No hepatic laceration. Postcholecystectomy. Mild intra and extra hepatic biliary duct dilatation is favored chronic. Pancreas: Pancreas is normal. No ductal dilatation. No pancreatic inflammation. Spleen: No splenic injury. Adrenals/urinary tract: Thickening LEFT adrenal gland to 2 cm (image 51/4. Washout of contrast (greater than 40%) on delayed imaging indicates benign adrenal adenoma. Bilateral renal cortical thinning. Ureters normal. Bladder intact Stomach/Bowel: The stomach, duodenum, and small bowel normal. The colon and rectosigmoid colon are normal. Vascular/Lymphatic: Abdominal aorta is normal caliber with atherosclerotic calcification. There is no retroperitoneal or periportal lymphadenopathy. No pelvic lymphadenopathy. Reproductive: Post hysterectomy.  Adnexa unremarkable Other: No free fluid. Musculoskeletal: Superior endplate compression fracture at L2. There is sclerosis along the depressed fracture plane. No paraspinal hematoma evident. Approximately 26% loss vertebral body height centrally (image 84/8 and image 52/7). No significant retropulsion. IMPRESSION: Chest Impression: 1. No evidence of thoracic trauma. 2. Bilateral most rib fractures. 3. Biapical chronic pleuroparenchymal scarring. Abdomen / Pelvis Impression: 1. Age-indeterminate compression fracture at L2. Proximally 20% loss vertebral body height.  No significant retropulsion. 2. Bilateral renal cortical thinning. 3. Mild biliary duct dilatation following cholecystectomy. Consider correlation with bilirubin levels. 4. Benign LEFT adrenal adenoma. Electronically Signed   By: SSuzy BouchardM.D.   On: 11/09/2022 16:07   CT Cervical Spine Wo Contrast  Result Date: 11/09/2022 CLINICAL DATA:  Found down EXAM: CT CERVICAL SPINE WITHOUT CONTRAST TECHNIQUE: Multidetector CT imaging of the cervical spine was performed without intravenous contrast. Multiplanar CT image reconstructions were also generated. RADIATION DOSE REDUCTION: This exam was performed according to the departmental dose-optimization program which includes automated exposure control, adjustment of the mA and/or kV according to patient size and/or use of iterative reconstruction technique. COMPARISON:  11/01/2022 FINDINGS: Alignment: Normal. Skull base and vertebrae: No acute fracture. No primary bone lesion or focal pathologic process. Soft tissues and spinal canal: No prevertebral fluid or swelling. No visible canal hematoma. Disc levels: Multilevel degenerative disc disease noted with loss of disc space and  marginal osteophytes from the C3 through the C7 levels. There is osteoarthritis identified at C1-C2. Facet joint degenerative changes noted most severely at C3-4 on the right and bilaterally at C4-5. Upper chest: Negative. Other: None. IMPRESSION: Multilevel degenerative changes.  No acute traumatic abnormalities. Electronically Signed   By: Sammie Bench M.D.   On: 11/09/2022 15:55   CT Head Wo Contrast  Result Date: 11/09/2022 CLINICAL DATA:  Mental status change, unknown cause EXAM: CT HEAD WITHOUT CONTRAST TECHNIQUE: Contiguous axial images were obtained from the base of the skull through the vertex without intravenous contrast. RADIATION DOSE REDUCTION: This exam was performed according to the departmental dose-optimization program which includes automated exposure control,  adjustment of the mA and/or kV according to patient size and/or use of iterative reconstruction technique. COMPARISON:  11/01/2022 FINDINGS: Brain: There is periventricular white matter decreased attenuation consistent with small vessel ischemic changes. Ventricles, sulci and cisterns are prominent consistent with age related involutional changes. No acute intracranial hemorrhage, mass effect or shift. No hydrocephalus. Vascular: No hyperdense vessel or unexpected calcification. Skull: Normal. Negative for fracture or focal lesion. Sinuses/Orbits: No acute finding. IMPRESSION: Atrophy and chronic small vessel ischemic changes. No acute intracranial process identified. Electronically Signed   By: Sammie Bench M.D.   On: 11/09/2022 15:53   DG Chest 1 View  Result Date: 11/09/2022 CLINICAL DATA:  Altered mental status EXAM: CHEST  1 VIEW COMPARISON:  10/29/2022 FINDINGS: Remote proximal left humerus fracture. Remote posterior upper left rib fractures. Patient rotated to the right. Midline trachea. Normal heart size. Mild right hemidiaphragm elevation. No pleural effusion or pneumothorax. Bilateral breast implants, projecting over the lungs. No lobar consolidation. IMPRESSION: No acute cardiopulmonary disease. Electronically Signed   By: Abigail Miyamoto M.D.   On: 11/09/2022 15:20        Scheduled Meds:  apixaban  5 mg Oral BID   digoxin  125 mcg Oral Daily   folic acid  1 mg Oral Daily   lidocaine  1 patch Transdermal Q24H   metoprolol tartrate  50 mg Oral BID   potassium chloride  40 mEq Oral BID   rosuvastatin  10 mg Oral Daily   [START ON 11/14/2022] Vitamin D (Ergocalciferol)  50,000 Units Oral Q Sun   Continuous Infusions:  cefTRIAXone (ROCEPHIN)  IV 2 g (11/10/22 2124)     LOS: 1 day    Time spent: 35 minutes    Barb Merino, MD [facility] Pager [phone]

## 2022-11-11 NOTE — TOC Initial Note (Signed)
Transition of Care [facility]) - Initial/Assessment Note    Patient Details  Name: Donna Evans MRN: [id number] Date of Birth: 1946-10-17  Transition of [facility]) CM/SW Contact:    Candie Chroman, LCSW Phone Number: 11/11/2022, 2:37 PM  Clinical Narrative:   Met with patient. Nephew/HCPOA at bedside. CSW introduced role and explained that therapy recommendations would be discussed. Nephew does not agree with PT recommendations for home health. Discussed that she walked 200 feet today. Discussed adding additional personal care service hours. Patient already has life alert necklace. Discussed adding security cameras in the home so they can have additional supervision when they are unable to be with her. Nephew voiced concern about patient missing medication doses due to sleeping late or confusion. Discussed setting alarms to take medications throughout the day.                Expected Discharge Plan: [place] Barriers to Discharge: Continued Medical Work up   Patient Goals and CMS Choice        Expected Discharge Plan and Services Expected Discharge Plan: [place] Choice: [place] arrangements for the past 2 months: Single Family Home                           HH Arranged: RN, PT, OT, Nurse's Aide, Social Work [employer] Agency: [place] Date [place]: 11/11/22   Representative spoke with at [place]: [place]  Prior Living Arrangements/Services Living arrangements for the past 2 months: [place] with:: Self Patient language and need for interpreter reviewed:: Yes Do you feel safe going back to the place where you live?: Yes      Need for Family Participation in Patient Care: Yes (Comment) Care giver support system in place?: Yes (comment) Current home services: DME, Homehealth aide, Home OT, Home PT, Home RN (Home Health social worker) Criminal Activity/Legal  Involvement Pertinent to Current Situation/Hospitalization: No - Comment as needed  Activities of Daily Living Home Assistive Devices/Equipment: Environmental consultant (specify type), Eyeglasses ADL Screening (condition at time of admission) Patient's cognitive ability adequate to safely complete daily activities?: Yes Is the patient deaf or have difficulty hearing?: No Does the patient have difficulty seeing, even when wearing glasses/contacts?: No Does the patient have difficulty concentrating, remembering, or making decisions?: Yes Patient able to express need for assistance with ADLs?: Yes Does the patient have difficulty dressing or bathing?: No Independently performs ADLs?: Yes (appropriate for developmental age) Does the patient have difficulty walking or climbing stairs?: Yes Weakness of Legs: Both Weakness of Arms/Hands: Both  Permission Sought/Granted Permission sought to share information with : Facility Sport and exercise psychologist, Family Supports Permission granted to share information with : Yes, Verbal Permission Granted  Share Information with NAME: Tomma Rakers  Permission granted to share info w AGENCY: [place] granted to share info w Relationship: Nephew/HCPOA  Permission granted to share info w Contact Information: [phone]  Emotional Assessment Appearance:: Appears stated age Attitude/Demeanor/Rapport: Engaged, Gracious Affect (typically observed): Accepting, Appropriate, Calm, Pleasant Orientation: : Oriented to Self, Oriented to Place, Oriented to  Time, Oriented to Situation Alcohol / Substance Use: Not Applicable Psych Involvement: No (comment)  Admission diagnosis:  Altered mental status [R41.82] Encephalopathy [G93.40] Acute encephalopathy [G93.40] Atrial fibrillation with RVR (HCC) [I48.91] Lumbar compression fracture, closed, initial encounter ([place]) [W54.627O] Acute metabolic encephalopathy [J50.09]  Patient Active Problem List   Diagnosis  Date Noted   Encephalopathy 11/10/2022   SIRS (systemic inflammatory response syndrome) ([place]) 11/09/2022   Stage 3b chronic kidney disease (CKD) ([place]) 11/09/2022   UTI (urinary tract infection) 11/09/2022   Chronic kidney disease, stage 3a ([place]) 25/95/6387   Metabolic acidosis 56/43/3295   Steatohepatitis, non-alcoholic 18/84/1660   Paroxysmal atrial fibrillation with RVR ([place]) 10/14/2022   Delirium due to another medical condition 10/08/2022   Bilateral leg edema    Community acquired pneumonia of left lower lobe of lung    Head injury    Ground-level fall 10/06/2022   Acute encephalopathy 10/06/2022   Lymphedema 10/06/2022   Elevated LFTs 10/06/2022   HLD (hyperlipidemia) 08/10/2022   Elevated alkaline phosphatase level 08/10/2022   Elevated troponin 04/28/2022   Stroke ([place]) 04/28/2022   Fall 04/28/2022   Acute renal failure superimposed on stage 3a chronic kidney disease ([place]) 04/28/2022   Chronic diastolic CHF (congestive heart failure) ([place]) 63/12/6008   Acute metabolic encephalopathy 93/23/5573   Venous insufficiency 03/31/2022   SOB (shortness of breath) on exertion 03/08/2022   Pedal edema 03/08/2022   Closed fracture of distal end of left radius 12/22/2021   Low back pain 12/17/2021   Fracture of pelvis ([place]) 11/13/2021   Bilateral leg weakness 05/13/2021   Memory change 03/04/2021   Imbalance 03/04/2021   Falls frequently 03/04/2021   Dizziness 03/04/2021   Senile osteoporosis 10/18/2019   Long term current use of opiate analgesic 07/04/2017   Long term prescription opiate use 07/04/2017   Opiate use 07/04/2017   Bursitis of shoulder 07/04/2017   Closed fracture of upper end of humerus 07/04/2017   Knee pain 07/04/2017   Localized, primary osteoarthritis 07/04/2017   Osteoarthritis of knee 07/04/2017   Pain in limb 07/04/2017   Sprain of wrist 07/04/2017   Pain in joint involving ankle and foot 07/04/2017   Impingement syndrome of left shoulder region  06/29/2017   Trochanteric bursitis 06/29/2017   AKI (acute kidney injury) ([place]) 10/01/2016   Syncope 09/30/2016   Migraine headache 08/10/2016   Controlled substance agreement signed 06/12/2016   Insomnia 06/12/2016   Hypertension, benign 02/04/2016   Easy bruisability 11/13/2015   Osteoporosis 11/13/2015   Fibromyalgia syndrome 11/13/2015   Generalized anxiety disorder 11/13/2015   Chronic pain syndrome 11/13/2015   Overactive detrusor 09/11/2015   Adrenal adenoma 09/02/2015   Chronic cystitis 09/02/2015   Incomplete bladder emptying 22/01/5426   Renal colic 06/05/7627   Urinary tract infection 07/28/2015   Gross hematuria 07/28/2015   History of reconstruction of both breasts 06/18/2015   S/P cardiac catheterization 06/18/2015   Acquired absence of both breasts and nipples 11/20/2014   Transient cerebral ischemia 11/28/2013   Hemiplegia of dominant side ([place]) 11/20/2013   PCP:  Jodi Marble, MD Pharmacy:   [place], [place] [address] Phone: [phone] Fax: [phone]  [facility] [place] [place] [address] [place] [address] Phone: [phone] Fax: [phone]  [id number] [place]: [phone]: [address]Phone: [phone]: [phone]    Social Determinants of Health (SDOH) Interventions    Readmission Risk Interventions     No data to display

## 2022-11-11 NOTE — TOC CM/SW Note (Signed)
Left voicemail for nephew to discuss PT recommendations. Patient is active with [facility] for PT, OT, RN, SW. They can add aide services.  Dayton Scrape, [place]

## 2022-11-11 NOTE — Progress Notes (Signed)
Daily Progress Note   Patient Name: Donna Evans       Date: 11/11/2022 DOB: 18-May-1946  Age: 76 y.o. MRN#: [id number] Attending Physician: Barb Merino, MD Primary Care Physician: Jodi Marble, MD Admit Date: 11/09/2022  Reason for Consultation/Follow-up: Establishing goals of care  Subjective: Notes, labs, and diagnostics reviewed.  Right hand xray completed with acute to subacute minimally displaced oblique fracture of proximal phalanx of 5th finger.   Patient is working with therapies to determine dispo. She states she walked with PT today, but now has back pain as she feels she over-did it. She states now that she is settled back in bed she has pain with movement.   Goals set for DNR/DNI, continue to treat the treatable.   Length of Stay: 1  Current Medications: Scheduled Meds:   apixaban  5 mg Oral BID   digoxin  125 mcg Oral Daily   folic acid  1 mg Oral Daily   lidocaine  1 patch Transdermal Q24H   metoprolol tartrate  50 mg Oral BID   potassium chloride  40 mEq Oral BID   rosuvastatin  10 mg Oral Daily   [START ON 11/14/2022] Vitamin D (Ergocalciferol)  50,000 Units Oral Q Sun    Continuous Infusions:  cefTRIAXone (ROCEPHIN)  IV 2 g (11/10/22 2124)    PRN Meds: acetaminophen **OR** acetaminophen, alum & mag hydroxide-simeth, cyclobenzaprine, HYDROcodone-acetaminophen, nicotine, ondansetron **OR** ondansetron (ZOFRAN) IV, zolpidem  Physical Exam Pulmonary:     Effort: Pulmonary effort is normal.  Neurological:     Mental Status: She is alert.             Vital Signs: BP (!) 137/58 (BP Location: Right Arm)   Pulse 70   Temp 98 F (36.7 C) (Oral)   Resp 16   Ht '5\' 7"'$  (1.702 m)   Wt 50.8 kg   SpO2 100%   BMI 17.54 kg/m  SpO2: SpO2: 100 % O2  Device: O2 Device: Room Air O2 Flow Rate:    Intake/output summary:  Intake/Output Summary (Last 24 hours) at 11/11/2022 1602 Last data filed at 11/11/2022 0600 Gross per 24 hour  Intake 200 ml  Output --  Net 200 ml   LBM: Last BM Date : 11/10/22 Baseline Weight: Weight: 50.8 kg Most recent weight: Weight: 50.8 kg  Patient Active Problem List   Diagnosis Date Noted   Encephalopathy 11/10/2022   SIRS (systemic inflammatory response syndrome) ([place]) 11/09/2022   Stage 3b chronic kidney disease (CKD) ([place]) 11/09/2022   UTI (urinary tract infection) 11/09/2022   Chronic kidney disease, stage 3a ([place]) 82/95/6213   Metabolic acidosis 08/65/7846   Steatohepatitis, non-alcoholic 96/29/5284   Paroxysmal atrial fibrillation with RVR ([place]) 10/14/2022   Delirium due to another medical condition 10/08/2022   Bilateral leg edema    Community acquired pneumonia of left lower lobe of lung    Head injury    Ground-level fall 10/06/2022   Acute encephalopathy 10/06/2022   Lymphedema 10/06/2022   Elevated LFTs 10/06/2022   HLD (hyperlipidemia) 08/10/2022   Elevated alkaline phosphatase level 08/10/2022   Elevated troponin 04/28/2022   Stroke ([place]) 04/28/2022   Fall 04/28/2022   Acute renal failure superimposed on stage 3a chronic kidney disease ([place]) 04/28/2022   Chronic diastolic CHF (congestive heart failure) ([place]) 13/24/4010   Acute metabolic encephalopathy 27/25/3664   Venous insufficiency 03/31/2022   SOB (shortness of breath) on exertion 03/08/2022   Pedal edema 03/08/2022   Closed fracture of distal end of left radius 12/22/2021   Low back pain 12/17/2021   Fracture of pelvis ([place]) 11/13/2021   Bilateral leg weakness 05/13/2021   Memory change 03/04/2021   Imbalance 03/04/2021   Falls frequently 03/04/2021   Dizziness 03/04/2021   Senile osteoporosis 10/18/2019   Long term current use of opiate analgesic 07/04/2017   Long term prescription opiate use 07/04/2017   Opiate  use 07/04/2017   Bursitis of shoulder 07/04/2017   Closed fracture of upper end of humerus 07/04/2017   Knee pain 07/04/2017   Localized, primary osteoarthritis 07/04/2017   Osteoarthritis of knee 07/04/2017   Pain in limb 07/04/2017   Sprain of wrist 07/04/2017   Pain in joint involving ankle and foot 07/04/2017   Impingement syndrome of left shoulder region 06/29/2017   Trochanteric bursitis 06/29/2017   AKI (acute kidney injury) ([place]) 10/01/2016   Syncope 09/30/2016   Migraine headache 08/10/2016   Controlled substance agreement signed 06/12/2016   Insomnia 06/12/2016   Hypertension, benign 02/04/2016   Easy bruisability 11/13/2015   Osteoporosis 11/13/2015   Fibromyalgia syndrome 11/13/2015   Generalized anxiety disorder 11/13/2015   Chronic pain syndrome 11/13/2015   Overactive detrusor 09/11/2015   Adrenal adenoma 09/02/2015   Chronic cystitis 09/02/2015   Incomplete bladder emptying 40/34/7425   Renal colic 95/63/8756   Urinary tract infection 07/28/2015   Gross hematuria 07/28/2015   History of reconstruction of both breasts 06/18/2015   S/P cardiac catheterization 06/18/2015   Acquired absence of both breasts and nipples 11/20/2014   Transient cerebral ischemia 11/28/2013   Hemiplegia of dominant side ([place]) 11/20/2013    Palliative Care Assessment & Plan   Recommendations/Plan: Goals of care set for DNR/DNI, continue to treat the treatable. Amenable to SNF placement.  PMT will shadow for needs.   Code Status:    Code Status Orders  (From admission, onward)           Start     Ordered   11/09/22 1803  Do not attempt resuscitation (DNR)  Continuous       Question Answer Comment  In the event of cardiac or respiratory ARREST Do not call a "code blue"   In the event of cardiac or respiratory ARREST Do not perform Intubation, CPR, defibrillation or ACLS   In the event of cardiac or respiratory ARREST  Use medication by any route, position, wound care, and  other measures to relive pain and suffering. May use oxygen, suction and manual treatment of airway obstruction as needed for comfort.      11/09/22 1802           Code Status History     Date Active Date Inactive Code Status Order ID Comments User Context   11/09/2022 1802 11/09/2022 1802 Full Code [id number]  CoxBriant Cedar, DO ED   11/02/2022 0205 11/02/2022 1747 DNR [id number]  Caren Griffins, MD ED   10/06/2022 1713 10/18/2022 2045 DNR [id number]  Jose Persia, MD ED   08/10/2022 0003 08/13/2022 2027 Full Code [id number]  Clance Boll, MD ED   04/28/2022 1950 05/04/2022 1846 DNR [id number]  Ivor Costa, MD ED   04/28/2022 1511 04/28/2022 1950 Full Code [id number]  Ivor Costa, MD ED   12/31/2019 1744 12/31/2019 2327 Full Code [id number]  Lovell Sheehan, MD Inpatient   10/01/2016 0208 10/01/2016 2103 DNR [id number]  Karmen Bongo, MD Inpatient   08/29/2015 1745 08/31/2015 1326 Full Code [id number]  Vaughan Basta, MD Inpatient       Thank you for allowing the Palliative Medicine Team to assist in the care of this patient.     Asencion Gowda, NP  Please contact Palliative Medicine Team phone at [phone] for questions and concerns.

## 2022-11-11 NOTE — Progress Notes (Signed)
Mobility Specialist - Progress Note   11/11/22 1117  Mobility  Activity Transferred from chair to bed  Level of Assistance Standby assist, set-up cues, supervision of patient - no hands on  Assistive Device Front wheel walker  Distance Ambulated (ft) 4 ft  Activity Response Tolerated well  Mobility Referral Yes  $Mobility charge 1 Mobility   Pt sitting in recliner upon entry, utilizing RA. Pt STS to RW MinA, ambulation to bed SBA. Pt transferred from recliner to bed, tolerated well. Pt completed bed mob ModI, left supine with alarm set and needs within reach.   Candie Mile Mobility Specialist 11/11/22 11:21 AM

## 2022-11-12 ENCOUNTER — Inpatient Hospital Stay: Payer: PPO

## 2022-11-12 DIAGNOSIS — G9341 Metabolic encephalopathy: Secondary | ICD-10-CM | POA: Diagnosis not present

## 2022-11-12 LAB — CBC
HCT: 31.6 % — ABNORMAL LOW (ref 36.0–46.0)
Hemoglobin: 10.8 g/dL — ABNORMAL LOW (ref 12.0–15.0)
MCH: 31 pg (ref 26.0–34.0)
MCHC: 34.2 g/dL (ref 30.0–36.0)
MCV: 90.8 fL (ref 80.0–100.0)
Platelets: 173 10*3/uL (ref 150–400)
RBC: 3.48 MIL/uL — ABNORMAL LOW (ref 3.87–5.11)
RDW: 14.6 % (ref 11.5–15.5)
WBC: 10.9 10*3/uL — ABNORMAL HIGH (ref 4.0–10.5)
nRBC: 0 % (ref 0.0–0.2)

## 2022-11-12 LAB — BASIC METABOLIC PANEL
Anion gap: 8 (ref 5–15)
BUN: 25 mg/dL — ABNORMAL HIGH (ref 8–23)
CO2: 23 mmol/L (ref 22–32)
Calcium: 8.4 mg/dL — ABNORMAL LOW (ref 8.9–10.3)
Chloride: 108 mmol/L (ref 98–111)
Creatinine, Ser: 1.13 mg/dL — ABNORMAL HIGH (ref 0.44–1.00)
GFR, Estimated: 50 mL/min — ABNORMAL LOW (ref >60)
Glucose, Bld: 121 mg/dL — ABNORMAL HIGH (ref 70–99)
Potassium: 4.3 mmol/L (ref 3.5–5.1)
Sodium: 139 mmol/L (ref 135–145)

## 2022-11-12 LAB — TROPONIN I (HIGH SENSITIVITY)
Troponin I (High Sensitivity): 18 ng/L — ABNORMAL HIGH (ref <18)
Troponin I (High Sensitivity): 18 ng/L — ABNORMAL HIGH (ref <18)
Troponin I (High Sensitivity): 16 ng/L (ref <18)

## 2022-11-12 MED ORDER — POTASSIUM CHLORIDE CRYS ER 20 MEQ PO TBCR
40.0000 meq | EXTENDED_RELEASE_TABLET | Freq: Once | ORAL | Status: AC
  Administered 2022-11-12: 40 meq via ORAL
  Filled 2022-11-12: qty 2

## 2022-11-12 MED ORDER — METOPROLOL TARTRATE 25 MG PO TABS
12.5000 mg | ORAL_TABLET | Freq: Two times a day (BID) | ORAL | Status: DC
  Administered 2022-11-12 – 2022-11-13 (×2): 12.5 mg via ORAL
  Filled 2022-11-12 (×3): qty 1

## 2022-11-12 MED ORDER — PANTOPRAZOLE SODIUM 40 MG PO TBEC
40.0000 mg | DELAYED_RELEASE_TABLET | Freq: Every day | ORAL | Status: DC
  Administered 2022-11-12 – 2022-11-18 (×7): 40 mg via ORAL
  Filled 2022-11-12 (×7): qty 1

## 2022-11-12 MED ORDER — LIDOCAINE 5 % EX PTCH
1.0000 | MEDICATED_PATCH | CUTANEOUS | Status: DC
  Administered 2022-11-12 – 2022-11-18 (×7): 1 via TRANSDERMAL
  Filled 2022-11-12 (×9): qty 1

## 2022-11-12 MED ORDER — FUROSEMIDE 10 MG/ML IJ SOLN
40.0000 mg | Freq: Once | INTRAMUSCULAR | Status: AC
  Administered 2022-11-12: 40 mg via INTRAVENOUS
  Filled 2022-11-12: qty 4

## 2022-11-12 MED ORDER — IBUPROFEN 400 MG PO TABS
400.0000 mg | ORAL_TABLET | Freq: Four times a day (QID) | ORAL | Status: DC | PRN
  Administered 2022-11-14 – 2022-11-15 (×3): 400 mg via ORAL
  Filled 2022-11-12 (×3): qty 1

## 2022-11-12 NOTE — NC FL2 (Signed)
[place] LEVEL OF CARE FORM     IDENTIFICATION  Patient Name: Donna Evans Birthdate: 1946/03/09 Sex: female Admission Date (Current Location): 11/09/2022  [facility] and [place] Number:  Engineering geologist and Address:         Provider Number: [id number]  Attending Physician Name and Address:  Shelly Coss, MD  Relative Name and Phone Number:  Tomma Rakers [facility]) [phone] (Mobile)    Current Level of Care: Hospital Recommended Level of Care: [place] Prior Approval Number:    Date Approved/Denied:   PASRR Number: [id number]  Discharge Plan:      Current Diagnoses: Patient Active Problem List   Diagnosis Date Noted   Encephalopathy 11/10/2022   SIRS (systemic inflammatory response syndrome) ([place]) 11/09/2022   Stage 3b chronic kidney disease (CKD) ([place]) 11/09/2022   UTI (urinary tract infection) 11/09/2022   Chronic kidney disease, stage 3a ([place]) 20/94/7096   Metabolic acidosis 28/36/6294   Steatohepatitis, non-alcoholic 76/54/6503   Paroxysmal atrial fibrillation with RVR ([place]) 10/14/2022   Delirium due to another medical condition 10/08/2022   Bilateral leg edema    Community acquired pneumonia of left lower lobe of lung    Head injury    Ground-level fall 10/06/2022   Acute encephalopathy 10/06/2022   Lymphedema 10/06/2022   Elevated LFTs 10/06/2022   HLD (hyperlipidemia) 08/10/2022   Elevated alkaline phosphatase level 08/10/2022   Elevated troponin 04/28/2022   Stroke ([place]) 04/28/2022   Fall 04/28/2022   Acute renal failure superimposed on stage 3a chronic kidney disease ([place]) 04/28/2022   Chronic diastolic CHF (congestive heart failure) ([place]) 54/65/6812   Acute metabolic encephalopathy 75/17/0017   Venous insufficiency 03/31/2022   SOB (shortness of breath) on exertion 03/08/2022   Pedal edema 03/08/2022   Closed fracture of distal end of left radius 12/22/2021   Low back pain 12/17/2021   Fracture of pelvis  ([place]) 11/13/2021   Bilateral leg weakness 05/13/2021   Memory change 03/04/2021   Imbalance 03/04/2021   Falls frequently 03/04/2021   Dizziness 03/04/2021   Senile osteoporosis 10/18/2019   Long term current use of opiate analgesic 07/04/2017   Long term prescription opiate use 07/04/2017   Opiate use 07/04/2017   Bursitis of shoulder 07/04/2017   Closed fracture of upper end of humerus 07/04/2017   Knee pain 07/04/2017   Localized, primary osteoarthritis 07/04/2017   Osteoarthritis of knee 07/04/2017   Pain in limb 07/04/2017   Sprain of wrist 07/04/2017   Pain in joint involving ankle and foot 07/04/2017   Impingement syndrome of left shoulder region 06/29/2017   Trochanteric bursitis 06/29/2017   AKI (acute kidney injury) ([place]) 10/01/2016   Syncope 09/30/2016   Migraine headache 08/10/2016   Controlled substance agreement signed 06/12/2016   Insomnia 06/12/2016   Hypertension, benign 02/04/2016   Easy bruisability 11/13/2015   Osteoporosis 11/13/2015   Fibromyalgia syndrome 11/13/2015   Generalized anxiety disorder 11/13/2015   Chronic pain syndrome 11/13/2015   Overactive detrusor 09/11/2015   Adrenal adenoma 09/02/2015   Chronic cystitis 09/02/2015   Incomplete bladder emptying 49/44/9675   Renal colic 91/63/8466   Urinary tract infection 07/28/2015   Gross hematuria 07/28/2015   History of reconstruction of both breasts 06/18/2015   S/P cardiac catheterization 06/18/2015   Acquired absence of both breasts and nipples 11/20/2014   Transient cerebral ischemia 11/28/2013   Hemiplegia of dominant side ([place]) 11/20/2013    Orientation RESPIRATION BLADDER Height & Weight     Self, Time, Situation,  Place  Normal Indwelling catheter, Incontinent Weight: 112 lb (50.8 kg) Height:  '5\' 7"'$  (170.2 cm)  BEHAVIORAL SYMPTOMS/MOOD NEUROLOGICAL BOWEL NUTRITION STATUS      Continent Diet (Regular)  AMBULATORY STATUS COMMUNICATION OF NEEDS Skin   Limited Assist Verbally  Bruising                       Personal Care Assistance Level of Assistance  Bathing, Feeding, Dressing Bathing Assistance: Limited assistance Feeding assistance: Limited assistance Dressing Assistance: Limited assistance     Functional Limitations Info             SPECIAL CARE FACTORS FREQUENCY  PT (By licensed PT), OT (By licensed OT)     PT Frequency: 5 times per week OT Frequency: 5 times per week            Contractures      Additional Factors Info  Code Status, Allergies Code Status Info: DNR Allergies Info: Allergies: Ciprofloxacin, Codeine, Demerol (Meperidine), Fluconazole, Latex, Morphine And Related, Doxycycline, Flagyl (Metronidazole), Influenza Vaccines, Tape, Valtrex (Valacyclovir Hcl), Buprenorphine, Duloxetine Hcl, Tramadol, Amoxicillin-pot Clavulanate, Sulfamethoxazole-trimethoprim, Valacyclovir           Current Medications (11/12/2022):  This is the current hospital active medication list Current Facility-Administered Medications  Medication Dose Route Frequency Provider Last Rate Last Admin   acetaminophen (TYLENOL) tablet 650 mg  650 mg Oral Q6H PRN Cox, Amy N, DO   650 mg at 11/10/22 1233   Or   acetaminophen (TYLENOL) suppository 650 mg  650 mg Rectal Q6H PRN Cox, Amy N, DO       alum & mag hydroxide-simeth (MAALOX/MYLANTA) 200-200-20 MG/5ML suspension 15 mL  15 mL Oral Q6H PRN Cox, Amy N, DO       apixaban (ELIQUIS) tablet 5 mg  5 mg Oral BID Cox, Amy N, DO   5 mg at 11/12/22 3149   cyclobenzaprine (FLEXERIL) tablet 5 mg  5 mg Oral TID PRN Barb Merino, MD   5 mg at 11/12/22 7026   digoxin (LANOXIN) tablet 125 mcg  125 mcg Oral Daily Cox, Amy N, DO   125 mcg at 11/12/22 1017   feeding supplement (ENSURE ENLIVE / ENSURE PLUS) liquid 237 mL  237 mL Oral BID BM Barb Merino, MD   237 mL at 37/85/88 5027   folic acid (FOLVITE) tablet 1 mg  1 mg Oral Daily Cox, Amy N, DO   1 mg at 11/12/22 7412   HYDROcodone-acetaminophen (NORCO/VICODIN)  5-325 MG per tablet 1 tablet  1 tablet Oral Q4H PRN Barb Merino, MD   1 tablet at 11/12/22 1549   ibuprofen (ADVIL) tablet 400 mg  400 mg Oral Q6H PRN Shelly Coss, MD       lidocaine (LIDODERM) 5 % 1 patch  1 patch Transdermal Q24H Shelly Coss, MD   1 patch at 11/12/22 1020   metoprolol tartrate (LOPRESSOR) tablet 12.5 mg  12.5 mg Oral BID Shelly Coss, MD       multivitamin with minerals tablet 1 tablet  1 tablet Oral Daily Barb Merino, MD   1 tablet at 11/12/22 8786   nicotine (NICODERM CQ - dosed in mg/24 hours) patch 14 mg  14 mg Transdermal Daily PRN Cox, Amy N, DO       ondansetron (ZOFRAN) tablet 4 mg  4 mg Oral Q6H PRN Cox, Amy N, DO       Or   ondansetron (ZOFRAN) injection 4 mg  4 mg Intravenous  Q6H PRN Cox, Amy N, DO   4 mg at 11/12/22 0616   pantoprazole (PROTONIX) EC tablet 40 mg  40 mg Oral Daily Shelly Coss, MD   40 mg at 11/12/22 1020   rosuvastatin (CRESTOR) tablet 10 mg  10 mg Oral Daily Cox, Amy N, DO   10 mg at 11/12/22 1017   [START ON 11/14/2022] Vitamin D (Ergocalciferol) (DRISDOL) 1.25 MG (50000 UNIT) capsule 50,000 Units  50,000 Units Oral Q Sun Cox, Amy N, DO       zolpidem (AMBIEN) tablet 5 mg  5 mg Oral QHS PRN Cox, Amy N, DO   5 mg at 11/10/22 2118     Discharge Medications: Please see discharge summary for a list of discharge medications.  Relevant Imaging Results:  Relevant Lab Results:   Additional Information SS #: [id number] [place], LCSW

## 2022-11-12 NOTE — Progress Notes (Signed)
Occupational Therapy Treatment Patient Details Name: Donna Evans MRN: [id number] DOB: 02/12/1946 Today's Date: 11/12/2022   History of present illness 76 year old female with history of A-fib on Eliquis, hypertension, hyperlipidemia, insomnia brought to the emergency department with confusion and fall at home, complaining of back pain. Found to have chronic L2 compression fx.   OT comments  [name] was seen for OT treatment on this date. Upon arrival to room pt reclined in bed, agreeable to tx. Pt requires MIN A for bed mobility and MIN A sit<>stand. When OT requested for pt to stand to change wet bed sheets, RW was in reach of pt however pt does not request it, stands with single UE on bed rail and has multiple LOBs. Attempts stepping away from bed however requires MOD A to prevent total LOB. Maintains static standing with CGA + BLE stabilized against bed.  Pt completed Pill Box Test with a failing score. The Pill Box test assesses a pt's ability to accurately follow common medication bottle instructions to fill a 1 week pill box. It assesses a pt's ability to plan, self-monitor, volition, and executive function. Pt demonstrated deficits in planning and self-monitoring. After reading medication label stating "take 1 tablet daily at bed time" pt stated "noon would be better for me." Pt recognizes errors with MOD cueing. Pt making good progress toward goals, will continue to follow POC. Discharge recommendation remains appropriate. If pt chooses to d/c home would recommend supervision for all medication mgmt.     Recommendations for follow up therapy are one component of a multi-disciplinary discharge planning process, led by the attending physician.  Recommendations may be updated based on patient status, additional functional criteria and insurance authorization.    Follow Up Recommendations  Skilled nursing-short term rehab (<3 hours/day)     Assistance Recommended at Discharge Frequent or  constant Supervision/Assistance  Patient can return home with the following  A little help with walking and/or transfers;A little help with bathing/dressing/bathroom;Help with stairs or ramp for entrance;Direct supervision/assist for medications management   Equipment Recommendations  None recommended by OT    Recommendations for Other Services      Precautions / Restrictions Precautions Precautions: Fall Required Braces or Orthoses: Spinal Brace Spinal Brace: Thoracolumbosacral orthotic Restrictions Weight Bearing Restrictions: No       Mobility Bed Mobility Overal bed mobility: Needs Assistance Bed Mobility: Supine to Sit, Sit to Supine     Supine to sit: Min assist Sit to supine: Min assist   General bed mobility comments: assist to log roll    Transfers Overall transfer level: Needs assistance Equipment used: None Transfers: Sit to/from Stand Sit to Stand: Min assist           General transfer comment: pt asked to stand to change wet bed sheets, RW in reach of pt however pt does not request it, stands with single UE on bed rail and has multiple LOBs. Attempts stepping away from bed however requires MOD A to prevent total LOB. Maintains static stanidng with BLE stabilized against bed     Balance Overall balance assessment: Needs assistance Sitting-balance support: No upper extremity supported, Feet supported Sitting balance-Leahy Scale: Good     Standing balance support: Single extremity supported, During functional activity Standing balance-Leahy Scale: Poor                             ADL either performed or assessed with clinical judgement  ADL Overall ADL's : Needs assistance/impaired                                       General ADL Comments: MIN A for simulated BSC t/f. MOD cues to complete pill box test      Cognition Arousal/Alertness: Awake/alert Behavior During Therapy: WFL for tasks assessed/performed Overall  Cognitive Status: Within Functional Limits for tasks assessed Area of Impairment: Following commands, Safety/judgement, Problem solving                       Following Commands: Follows one step commands with increased time Safety/Judgement: Decreased awareness of safety, Decreased awareness of deficits   Problem Solving: Slow processing, Decreased initiation, Difficulty sequencing, Requires verbal cues, Requires tactile cues          Exercises Other Exercises Other Exercises: pt copmpleted Pill Box Test with failing score            Pertinent Vitals/ Pain       Pain Assessment Pain Assessment: Faces Faces Pain Scale: Hurts a little bit Pain Location: R 5th digit Pain Descriptors / Indicators: Aching Pain Intervention(s): Limited activity within patient's tolerance   Frequency  Min 2X/week        Progress Toward Goals  OT Goals(current goals can now be found in the care plan section)  Progress towards OT goals: Progressing toward goals  Acute Rehab OT Goals Patient Stated Goal: to go home OT Goal Formulation: With patient/family Time For Goal Achievement: 11/24/22 Potential to Achieve Goals: Fair ADL Goals Pt Will Perform Grooming: with modified independence;standing Pt Will Perform Lower Body Dressing: with modified independence;sit to/from stand Pt Will Transfer to Toilet: with modified independence;ambulating;regular height toilet  Plan Discharge plan remains appropriate;Frequency remains appropriate       AM-PAC OT "6 Clicks" Daily Activity     Outcome Measure   Help from another person eating meals?: A Little Help from another person taking care of personal grooming?: A Little Help from another person toileting, which includes using toliet, bedpan, or urinal?: A Lot Help from another person bathing (including washing, rinsing, drying)?: A Little Help from another person to put on and taking off regular upper body clothing?: A Little Help from  another person to put on and taking off regular lower body clothing?: A Little 6 Click Score: 17    End of Session    OT Visit Diagnosis: Unsteadiness on feet (R26.81)   Activity Tolerance Patient tolerated treatment well   Patient Left in bed;with call bell/phone within reach;with bed alarm set   Nurse Communication          Time: 1019-1050 OT Time Calculation (min): 31 min  Charges: OT General Charges $OT Visit: 1 Visit OT Treatments $Self Care/Home Management : 23-37 mins  Dessie Coma, M.S. OTR/L  11/12/22, 12:36 PM  ascom [phone]

## 2022-11-12 NOTE — TOC Progression Note (Addendum)
Transition of Care [facility]) - Progression Note    Patient Details  Name: Donna Evans MRN: [id number] Date of Birth: 09/25/1946  Transition of Care [facility]) CM/SW [place], LCSW Phone Number: 11/12/2022, 4:16 PM  Clinical Narrative:    Received update from PT. Spoke with nephew, Tomma Rakers. He states they do not have the supervision at home for patient at this time and is agreeable to SNF. He states their first choice is [facility] and second choice is [facility]. Nephew states they do not want [facility] CSW is starting SNF work up.    Expected Discharge Plan: [place] Barriers to Discharge: Continued Medical Work up  Expected Discharge Plan and Services Expected Discharge Plan: [place] Choice: [place] arrangements for the past 2 months: Single Family Home                           HH Arranged: RN, PT, OT, Nurse's Aide, Social Work [employer] Agency: [place] Date [place]: 11/11/22   Representative spoke with at [place]: [place] [facility]   Social Determinants of Health ([place]) Interventions    Readmission Risk Interventions     No data to display

## 2022-11-12 NOTE — Progress Notes (Signed)
Troponin was 18, EKG NSR (in chart for view) and chest xray shows mild vascular congestion-reported to provider Hassan Rowan.

## 2022-11-12 NOTE — Progress Notes (Signed)
Physical Therapy Treatment Patient Details Name: Donna Evans MRN: [id number] DOB: 1946-09-29 Today's Date: 11/12/2022   History of Present Illness 76 year old female with history of A-fib on Eliquis, hypertension, hyperlipidemia, insomnia brought to the emergency department with confusion and fall at home, complaining of back pain. Found to have chronic L2 compression fx.    PT Comments    Pt resting in bed upon PT arrival; agreeable to therapy.  Pt requiring assist for donning/doffing TLSO during session.  Pt also requiring cues to use walker for transfers and ambulation.  During session pt SBA to min assist with bed mobility; min assist with transfers using RW; and CGA to ambulate 100 feet with RW use (pt requiring vc's to navigate/manage walker safely with obstacles).  Limited distance ambulating d/t low back pain (LBP minimal at rest beginning of session; 8/10 at rest end of session--nurse notified of pt's request for pain meds).  Anticipate pt will have difficulty managing at home safely; PT recommendations updated to SNF (TOC and MD notified).   Recommendations for follow up therapy are one component of a multi-disciplinary discharge planning process, led by the attending physician.  Recommendations may be updated based on patient status, additional functional criteria and insurance authorization.  Follow Up Recommendations  Skilled nursing-short term rehab (<3 hours/day) Can patient physically be transported by private vehicle: Yes   Assistance Recommended at Discharge Intermittent Supervision/Assistance  Patient can return home with the following A little help with walking and/or transfers;A little help with bathing/dressing/bathroom;Assistance with cooking/housework;Help with stairs or ramp for entrance;Assist for transportation   Equipment Recommendations  Rolling walker (2 wheels)    Recommendations for Other Services       Precautions / Restrictions Precautions Precautions:  Fall Required Braces or Orthoses: Spinal Brace Spinal Brace: Thoracolumbosacral orthotic Restrictions Weight Bearing Restrictions: No     Mobility  Bed Mobility Overal bed mobility: Needs Assistance Bed Mobility: Rolling, Sidelying to Sit, Sit to Sidelying Rolling: Supervision Sidelying to sit: Min assist (assist for trunk)     Sit to sidelying: Supervision General bed mobility comments: vc's for logrolling    Transfers Overall transfer level: Needs assistance Equipment used: Rolling walker (2 wheels) Transfers: Sit to/from Stand Sit to Stand: Min assist           General transfer comment: vc's for walker use; min assist to steady    Ambulation/Gait Ambulation/Gait assistance: Min guard Gait Distance (Feet): 100 Feet Assistive device: Rolling walker (2 wheels) Gait Pattern/deviations: Step-through pattern Gait velocity: decreased     General Gait Details: vc's for navigating/managing walker; limited distance d/t low back pain   Stairs             Wheelchair Mobility    Modified Rankin (Stroke Patients Only)       Balance Overall balance assessment: Needs assistance Sitting-balance support: No upper extremity supported, Feet supported Sitting balance-Leahy Scale: Good Sitting balance - Comments: steady sitting reaching within BOS   Standing balance support: During functional activity, Bilateral upper extremity supported, Reliant on assistive device for balance Standing balance-Leahy Scale: Fair                              Cognition Arousal/Alertness: Awake/alert Behavior During Therapy: WFL for tasks assessed/performed Overall Cognitive Status: Within Functional Limits for tasks assessed Area of Impairment: Following commands, Safety/judgement, Problem solving  Following Commands: Follows one step commands with increased time Safety/Judgement: Decreased awareness of safety, Decreased awareness of  deficits   Problem Solving: Slow processing, Decreased initiation, Difficulty sequencing, Requires verbal cues, Requires tactile cues          Exercises      General Comments  Nursing cleared pt for participation in physical therapy.  Pt agreeable to PT session.      Pertinent Vitals/Pain Pain Assessment Pain Assessment: 0-10 Pain Score: 8  Pain Location: low back Pain Descriptors / Indicators: Aching, Sore Pain Intervention(s): Limited activity within patient's tolerance, Monitored during session, Premedicated before session, Repositioned, Patient requesting pain meds-RN notified Vitals (HR and O2 on room air) stable and WFL throughout treatment session.    Home Living                          Prior Function            PT Goals (current goals can now be found in the care plan section) Acute Rehab PT Goals Patient Stated Goal: to improve mobility and balance PT Goal Formulation: With patient Time For Goal Achievement: 11/25/22 Potential to Achieve Goals: Good Progress towards PT goals: Progressing toward goals    Frequency    Min 2X/week      PT Plan Discharge plan needs to be updated (MD and TOC notified)    Co-evaluation              AM-PAC PT "6 Clicks" Mobility   Outcome Measure  Help needed turning from your back to your side while in a flat bed without using bedrails?: None Help needed moving from lying on your back to sitting on the side of a flat bed without using bedrails?: A Little Help needed moving to and from a bed to a chair (including a wheelchair)?: A Little Help needed standing up from a chair using your arms (e.g., wheelchair or bedside chair)?: A Little Help needed to walk in hospital room?: A Little Help needed climbing 3-5 steps with a railing? : A Little 6 Click Score: 19    End of Session Equipment Utilized During Treatment: Gait belt;Back brace Activity Tolerance: Patient limited by pain Patient left: in bed;with  call bell/phone within reach;with bed alarm set Nurse Communication: Mobility status;Patient requests pain meds;Precautions PT Visit Diagnosis: Other abnormalities of gait and mobility (R26.89);Muscle weakness (generalized) (M62.81);History of falling (Z91.81);Repeated falls (R29.6);Pain Pain - part of body:  (low back)     Time: 3151-7616 PT Time Calculation (min) (ACUTE ONLY): 32 min  Charges:  $Therapeutic Activity: 23-37 mins                     Leitha Bleak, PT 11/12/22, 8:47 PM

## 2022-11-12 NOTE — Progress Notes (Signed)
       CROSS COVER NOTE  NAME: Donna Evans MRN: [id number] DOB : 10/05/1946    Date of Service   11/12/2022  HPI/Events of Note   Patient complains of midsternal chest pressure that increases with deep breath  Assessment and  Interventions   Assessment: EKG reviewed by me. SR nonspecific st t changes Troponin 18 and flat Chest xray with pulm vascular congestion  Plan: 40 of lasix + Marissa NP [facility]

## 2022-11-12 NOTE — Progress Notes (Signed)
PROGRESS NOTE  Donna Evans  ZOX:[id number] DOB: 03-23-1946 DOA: 11/09/2022 PCP: Jodi Marble, MD   Brief Narrative: Patient is a 76 year old female with history of paroxysmal A-fib on Eliquis, hypertension, hyperlipidemia, insomnia who was brought to the emergency department from home with complaints of confusion, fall, back pain.  On presentation, she was hemodynamically stable ,on room air.  She complained of dysuria.  Patient has history of multiple hospitalizations recently, last one being on November this year when she was admitted with the same.  She lives alone with caretaker support.  UTI was suspected on admission this time but urine culture showed  some multiple species.  PT recommending home health on discharge.  Palliative  care also following.Likely dc tomorrow  Assessment & Plan:  Principal Problem:   Acute metabolic encephalopathy Active Problems:   SIRS (systemic inflammatory response syndrome) (HCC)   Syncope   Fibromyalgia syndrome   Generalized anxiety disorder   Chronic pain syndrome   Hypertension, benign   Memory change   HLD (hyperlipidemia)   Paroxysmal atrial fibrillation with RVR (HCC)   Stage 3b chronic kidney disease (CKD) (HCC)   UTI (urinary tract infection)   Encephalopathy   Acute metabolic encephalopathy: Thought to be secondary to UTI, likely underlying dementia.   MRI of the brain  did not show any acute findings.  Normal vitamin B12, TSH level.  Monitor mental status, delirium precautions, avoid sedatives/narcotics as much as possible. This morning she is alert and oriented.  UTI: Complained of dysuria on presentation.  Urine culture showed multiple species.  She completed 3 days of IV antibiotics with ceftriaxone. We will discontinue antibiotics.  Chest pain: Reproducible chest pain.  Tenderness on sternum on palpation.  This is most likely musculoskeletal pain.  Continue pain management, added ibuprofen, Protonix.  Lidocaine patch.   Troponins not significantly elevated, flat trend  Recurrent fall/L2 compression fracture: Compression fracture is chronic.  She lives alone.  She was given T SLO brace on last admission which is making her uncomfortable.  Her fracture is stable.  Brace discontinued.  Continue pain medications, supportive care. PT/OT recommending home health but she lives alone so she might benefit with SNF placement.  TOC following.  Nephew also requesting for SNF  Stage III CKD: Currently kidney function at baseline.  Paroxysmal A-fib: Currently in normal sinus rhythm.  Monitor on telemetry.  Patient is on digoxin, metoprolol.  On Eliquis for anticoagulation.  Dose of metoprolol reduced due to soft blood pressure.  Hypokalemia: Supplemented with potassium  Insomnia: Uses Ambien at night.  Try to avoid benzodiazepines as much as possible.  Goals of care: Elderly patient with multiple admissions recently.  Palliative care consulted for goals of care. CODE STATUS is DNR       DVT prophylaxis:Place TED hose Start: 11/09/22 1801 apixaban (ELIQUIS) tablet 5 mg     Code Status: DNR  Family Communication: Duane Boston on phone on 12/1  Patient status:Inpatient  Patient is from :Home  Anticipated discharge WJ:XBJY vs SNF  Estimated DC date:   Consultants: None  Procedures: None  Antimicrobials:  Anti-infectives (From admission, onward)    Start     Dose/Rate Route Frequency Ordered Stop   11/09/22 1930  cefTRIAXone (ROCEPHIN) 2 g in sodium chloride 0.9 % 100 mL IVPB        2 g 200 mL/hr over 30 Minutes Intravenous Every 24 hours 11/09/22 1850 11/14/22 1929       Subjective:  Patient seen and examined  at bedside today.  Hemodynamically stable.  Comfortable overall.  She is alert and oriented.  She developed some chest discomfort since last night.  The pain is reproducible in nature.  When I pressed her chest, she bounced with pain.  She does not feel ready to go home  yet.  Objective: Vitals:   11/11/22 1946 11/12/22 0053 11/12/22 0401 11/12/22 0746  BP: 128/61 (!) 122/58 (!) 130/52 (!) 97/58  Pulse: 76 70 78 73  Resp: '20 20 20 18  '$ Temp: 98.9 F (37.2 C) 98.1 F (36.7 C) 98.9 F (37.2 C) 98.4 F (36.9 C)  TempSrc: Oral Oral Oral Oral  SpO2: 95% 95% 96% 93%  Weight:      Height:        Intake/Output Summary (Last 24 hours) at 11/12/2022 0758 Last data filed at 11/12/2022 7425 Gross per 24 hour  Intake 120 ml  Output 900 ml  Net -780 ml   Filed Weights   11/09/22 1427  Weight: 50.8 kg    Examination:  General exam: Overall comfortable, not in distress HEENT: PERRL Respiratory system:  no wheezes or crackles  Cardiovascular system: S1 & S2 heard, RRR.  Tenderness on palpation of the sternum Gastrointestinal system: Abdomen is nondistended, soft and nontender. Central nervous system: Alert and oriented Extremities: No edema, no clubbing ,no cyanosis Skin: No rashes, no ulcers,no icterus     Data Reviewed: I have personally reviewed following labs and imaging studies  CBC: Recent Labs  Lab 11/09/22 1447 11/10/22 0531  WBC 11.8* 7.2  HGB 13.7 10.6*  HCT 41.3 33.0*  MCV 92.0 94.6  PLT 210 956   Basic Metabolic Panel: Recent Labs  Lab 11/09/22 1447 11/09/22 1947 11/10/22 0531  NA 141  --  144  K 3.6  --  3.1*  CL 110  --  114*  CO2 18*  --  21*  GLUCOSE 98  --  106*  BUN 43*  --  31*  CREATININE 1.35*  --  1.09*  CALCIUM 9.4  --  8.6*  MG  --  2.0  --   PHOS  --  2.9  --      Recent Results (from the past 240 hour(s))  Blood culture (routine x 2)     Status: None (Preliminary result)   Collection Time: 11/09/22  2:48 PM   Specimen: BLOOD  Result Value Ref Range Status   Specimen Description BLOOD RIGHT ANTECUBITAL  Final   Special Requests   Final    BOTTLES DRAWN AEROBIC AND ANAEROBIC Blood Culture results may not be optimal due to an inadequate volume of blood received in culture bottles   Culture   Final     NO GROWTH 3 DAYS Performed at [facility], [address] [place], [place] [address]    Report Status PENDING  Incomplete  Resp Panel by RT-PCR (Flu A&B, Covid) Anterior Nasal Swab     Status: None   Collection Time: 11/09/22  5:44 PM   Specimen: Anterior Nasal Swab  Result Value Ref Range Status   SARS Coronavirus 2 by RT PCR NEGATIVE NEGATIVE Final    Comment: (NOTE) SARS-CoV-2 target nucleic acids are NOT DETECTED.  The SARS-CoV-2 RNA is generally detectable in upper respiratory specimens during the acute phase of infection. The lowest concentration of SARS-CoV-2 viral copies this assay can detect is 138 copies/mL. A negative result does not preclude SARS-Cov-2 infection and should not be used as the sole basis for treatment or  other patient management decisions. A negative result may occur with  improper specimen collection/handling, submission of specimen other than nasopharyngeal swab, presence of viral mutation(s) within the areas targeted by this assay, and inadequate number of viral copies(<138 copies/mL). A negative result must be combined with clinical observations, patient history, and epidemiological information. The expected result is Negative.  Fact Sheet for Patients:  EntrepreneurPulse.com.au  Fact Sheet for Healthcare Providers:  IncredibleEmployment.be  This test is no t yet approved or cleared by the [place] FDA and  has been authorized for detection and/or diagnosis of SARS-CoV-2 by FDA under an Emergency Use Authorization (EUA). This EUA will remain  in effect (meaning this test can be used) for the duration of the COVID-19 declaration under Section 564(b)(1) of the Act, 21 U.S.C.section 360bbb-3(b)(1), unless the authorization is terminated  or revoked sooner.       Influenza A by PCR NEGATIVE NEGATIVE Final   Influenza B by PCR NEGATIVE NEGATIVE Final    Comment: (NOTE) The Xpert Xpress  SARS-CoV-2/FLU/RSV plus assay is intended as an aid in the diagnosis of influenza from Nasopharyngeal swab specimens and should not be used as a sole basis for treatment. Nasal washings and aspirates are unacceptable for Xpert Xpress SARS-CoV-2/FLU/RSV testing.  Fact Sheet for Patients: EntrepreneurPulse.com.au  Fact Sheet for Healthcare Providers: IncredibleEmployment.be  This test is not yet approved or cleared by the [place] FDA and has been authorized for detection and/or diagnosis of SARS-CoV-2 by FDA under an Emergency Use Authorization (EUA). This EUA will remain in effect (meaning this test can be used) for the duration of the COVID-19 declaration under Section 564(b)(1) of the Act, 21 U.S.C. section 360bbb-3(b)(1), unless the authorization is terminated or revoked.  Performed at [facility], [address], [place], [place] [address]   Urine Culture     Status: Abnormal   Collection Time: 11/09/22  5:50 PM   Specimen: Urine, Random  Result Value Ref Range Status   Specimen Description   Final    URINE, RANDOM Performed at [facility], [address] [place], [place] [address]    Special Requests   Final    NONE Performed at [facility], [address] [place], [place] [address]    Culture MULTIPLE SPECIES PRESENT, SUGGEST RECOLLECTION (A)  Final   Report Status 11/11/2022 FINAL  Final  Blood culture (routine x 2)     Status: None (Preliminary result)   Collection Time: 11/10/22  1:07 AM   Specimen: BLOOD  Result Value Ref Range Status   Specimen Description BLOOD RIGHT HAND  Final   Special Requests   Final    BOTTLES DRAWN AEROBIC AND ANAEROBIC Blood Culture adequate volume   Culture   Final    NO GROWTH 2 DAYS Performed at [facility], [address], [place], [place] [address]    Report Status PENDING  Incomplete     Radiology Studies: DG Chest Port 1 View  Result  Date: 11/12/2022 CLINICAL DATA:  Chest pain EXAM: PORTABLE CHEST 1 VIEW COMPARISON:  11/09/2022 FINDINGS: Cardiac shadow is within normal limits. Lungs are well aerated bilaterally. No focal infiltrate or sizable effusion is seen. Central vascular congestion is noted without edema. Bilateral breast implants are noted. Old healed rib fractures are noted bilaterally. IMPRESSION: Mild vascular congestion.  No other focal abnormality is noted. Electronically Signed   By: Inez Catalina M.D.   On: 11/12/2022 01:28   DG Finger Little Right  Result  Date: 11/11/2022 CLINICAL DATA:  Finger injury, superficial. Infection. Patient felt a pop and finger 1 week ago. EXAM: RIGHT LITTLE FINGER 2+V COMPARISON:  None Available. FINDINGS: There is oblique linear lucency within the mid and distal shaft and distal metaphysis of the proximal phalanx of fifth finger, extending from the lateral cortex of the distal metaphysis through the medial cortex of the proximal shaft. There is up to 1.5 mm lateral displacement of the distal fracture component with respect to the proximal fracture component. No significant AP dimension displacement. Moderate DIP and mild PIP joint space narrowing. No dislocation. IMPRESSION: Acute to subacute minimally displaced oblique fracture of the proximal phalanx of the fifth finger. Electronically Signed   By: Yvonne Kendall M.D.   On: 11/11/2022 14:43    Scheduled Meds:  apixaban  5 mg Oral BID   digoxin  125 mcg Oral Daily   feeding supplement  237 mL Oral BID BM   folic acid  1 mg Oral Daily   lidocaine  1 patch Transdermal Q24H   metoprolol tartrate  50 mg Oral BID   multivitamin with minerals  1 tablet Oral Daily   rosuvastatin  10 mg Oral Daily   [START ON 11/14/2022] Vitamin D (Ergocalciferol)  50,000 Units Oral Q Sun   Continuous Infusions:  cefTRIAXone (ROCEPHIN)  IV 2 g (11/11/22 2104)     LOS: 2 days   Shelly Coss, MD [facility] P12/12/2021, 7:58 AM

## 2022-11-12 NOTE — Progress Notes (Signed)
Patient alert and oriented x3. Upon rounding on patient, she complained of chest pressure and pain with breathing w/o shortness of breath. Vitals checked and were normal. Tele shows SR 69 BPM. Last chest xray was negative on 11/17. Reported to [facility].

## 2022-11-12 NOTE — Progress Notes (Signed)
Patient refused mobility this am, reports chest pain. Patient  assisted with repositioning in bed.

## 2022-11-12 NOTE — Progress Notes (Signed)
  Patient reports chest pressure, pointed to middle of chest radiating under her left breast to her back since last night.  Vitals below. Will report to MD    11/12/22 0746  Vitals  Temp Source Oral  BP (!) 97/58  MAP (mmHg) 70  BP Location Right Arm  BP Method Automatic  Patient Position (if appropriate) Lying  Pulse Rate 73  Pulse Rate Source Monitor  ECG Heart Rate 73  Resp 18

## 2022-11-13 DIAGNOSIS — G9341 Metabolic encephalopathy: Secondary | ICD-10-CM | POA: Diagnosis not present

## 2022-11-13 LAB — BASIC METABOLIC PANEL
Anion gap: 7 (ref 5–15)
BUN: 32 mg/dL — ABNORMAL HIGH (ref 8–23)
CO2: 23 mmol/L (ref 22–32)
Calcium: 8.4 mg/dL — ABNORMAL LOW (ref 8.9–10.3)
Chloride: 109 mmol/L (ref 98–111)
Creatinine, Ser: 1.31 mg/dL — ABNORMAL HIGH (ref 0.44–1.00)
GFR, Estimated: 42 mL/min — ABNORMAL LOW (ref >60)
Glucose, Bld: 112 mg/dL — ABNORMAL HIGH (ref 70–99)
Potassium: 4.7 mmol/L (ref 3.5–5.1)
Sodium: 139 mmol/L (ref 135–145)

## 2022-11-13 LAB — CBC
HCT: 30.4 % — ABNORMAL LOW (ref 36.0–46.0)
Hemoglobin: 9.8 g/dL — ABNORMAL LOW (ref 12.0–15.0)
MCH: 30.7 pg (ref 26.0–34.0)
MCHC: 32.2 g/dL (ref 30.0–36.0)
MCV: 95.3 fL (ref 80.0–100.0)
Platelets: 160 10*3/uL (ref 150–400)
RBC: 3.19 MIL/uL — ABNORMAL LOW (ref 3.87–5.11)
RDW: 14.5 % (ref 11.5–15.5)
WBC: 8.8 10*3/uL (ref 4.0–10.5)
nRBC: 0 % (ref 0.0–0.2)

## 2022-11-13 MED ORDER — METOPROLOL TARTRATE 50 MG PO TABS
50.0000 mg | ORAL_TABLET | Freq: Two times a day (BID) | ORAL | Status: DC
  Administered 2022-11-13 – 2022-11-14 (×2): 50 mg via ORAL
  Filled 2022-11-13 (×2): qty 1

## 2022-11-13 MED ORDER — METOPROLOL TARTRATE 25 MG PO TABS
37.5000 mg | ORAL_TABLET | Freq: Once | ORAL | Status: AC
  Administered 2022-11-13: 37.5 mg via ORAL
  Filled 2022-11-13: qty 2

## 2022-11-13 NOTE — Progress Notes (Signed)
PROGRESS NOTE  Donna Evans  EZM:[id number] DOB: 04-30-1946 DOA: 11/09/2022 PCP: Jodi Marble, MD   Brief Narrative: Patient is a 76 year old female with history of paroxysmal A-fib on Eliquis, hypertension, hyperlipidemia, insomnia who was brought to the emergency department from home with complaints of confusion, fall, back pain.  On presentation, she was hemodynamically stable ,on room air.  She complained of dysuria.  Patient has history of multiple hospitalizations recently, last one being on November this year when she was admitted with the same.  She lives alone with caretaker support.  UTI was suspected on admission this time but urine culture showed  some multiple species.  PT/OT recommending SNF on discharge.   Assessment & Plan:  Principal Problem:   Acute metabolic encephalopathy Active Problems:   SIRS (systemic inflammatory response syndrome) (HCC)   Syncope   Fibromyalgia syndrome   Generalized anxiety disorder   Chronic pain syndrome   Hypertension, benign   Memory change   HLD (hyperlipidemia)   Paroxysmal atrial fibrillation with RVR (HCC)   Stage 3b chronic kidney disease (CKD) (HCC)   UTI (urinary tract infection)   Encephalopathy   Acute metabolic encephalopathy: Thought to be secondary to UTI, likely underlying dementia.   MRI of the brain  did not show any acute findings.  Normal vitamin B12, TSH level.  Monitor mental status, delirium precautions, avoid sedatives/narcotics as much as possible. This morning she is alert and oriented.  UTI: Complained of dysuria on presentation.  Urine culture showed multiple species.  She completed 3 days of IV antibiotics with ceftriaxone. We will discontinue antibiotics.  Chest pain: Reproducible chest pain.  Tenderness on sternum on palpation.  This is most likely musculoskeletal pain.  Continue pain management, added ibuprofen, Protonix.  Lidocaine patch.  Troponins not significantly elevated, flat trend.Pain has  improved  Recurrent fall/L2 compression fracture: Compression fracture is chronic.  She lives alone.  She was given T SLO brace on last admission which is making her uncomfortable.  Her fracture is stable.  Brace discontinued.  Continue pain medications, supportive care. PT/OT recommending SNF placement.  TOC following.  Nephew also requesting for SNF  Stage III CKD: Currently kidney function at baseline.  Paroxysmal A-fib: Went into A-fib with RVR this morning.Patient is asymptomatic.  Monitor on telemetry.  Patient is on digoxin, metoprolol.  On Eliquis for anticoagulation.  Continue current medications  Hypokalemia: Supplemented with potassium and corrected  Insomnia: Uses Ambien at night.  Try to avoid benzodiazepines as much as possible.  Goals of care: Elderly patient with multiple admissions recently.  Palliative care consulted for goals of care. CODE STATUS is DNR       DVT prophylaxis:Place TED hose Start: 11/09/22 1801 apixaban (ELIQUIS) tablet 5 mg     Code Status: DNR  Family Communication: Duane Boston on phone on 12/1  Patient status:Inpatient  Patient is from :Home  Anticipated discharge to: SNF  Estimated DC date:as soon as bed is available   Consultants: None  Procedures: None  Antimicrobials:  Anti-infectives (From admission, onward)    Start     Dose/Rate Route Frequency Ordered Stop   11/09/22 1930  cefTRIAXone (ROCEPHIN) 2 g in sodium chloride 0.9 % 100 mL IVPB  Status:  Discontinued        2 g 200 mL/hr over 30 Minutes Intravenous Every 24 hours 11/09/22 1850 11/12/22 5035       Subjective:  Patient seen and examined at bedside today.  EKG monitor showed A-fib with  RVR but she is asymptomatic.  Chest pain has improved today.  She is anxious about SnF placement.  Denies any new complaints  Objective: Vitals:   11/13/22 0428 11/13/22 0742 11/13/22 0843 11/13/22 1100  BP: (!) 101/54 103/87 133/71 128/79  Pulse: 65 (!) 125 (!) 108 (!) 102   Resp: '18 18 18 18  '$ Temp: 97.8 F (36.6 C) 98.4 F (36.9 C)  98.6 F (37 C)  TempSrc:  Oral  Oral  SpO2: 92% (!) 80% 96% 94%  Weight:      Height:       No intake or output data in the 24 hours ending 11/13/22 1245  Filed Weights   11/09/22 1427  Weight: 50.8 kg    Examination:  General exam: Overall comfortable, not in distress HEENT: PERRL Respiratory system:  no wheezes or crackles  Cardiovascular system: Irregular regular rhythm Gastrointestinal system: Abdomen is nondistended, soft and nontender. Central nervous system: Alert and oriented Extremities: No edema, no clubbing ,no cyanosis Skin: No rashes, no ulcers,no icterus     Data Reviewed: I have personally reviewed following labs and imaging studies  CBC: Recent Labs  Lab 11/09/22 1447 11/10/22 0531 11/12/22 0810 11/13/22 0545  WBC 11.8* 7.2 10.9* 8.8  HGB 13.7 10.6* 10.8* 9.8*  HCT 41.3 33.0* 31.6* 30.4*  MCV 92.0 94.6 90.8 95.3  PLT 210 176 173 144   Basic Metabolic Panel: Recent Labs  Lab 11/09/22 1447 11/09/22 1947 11/10/22 0531 11/12/22 0810 11/13/22 0545  NA 141  --  144 139 139  K 3.6  --  3.1* 4.3 4.7  CL 110  --  114* 108 109  CO2 18*  --  21* 23 23  GLUCOSE 98  --  106* 121* 112*  BUN 43*  --  31* 25* 32*  CREATININE 1.35*  --  1.09* 1.13* 1.31*  CALCIUM 9.4  --  8.6* 8.4* 8.4*  MG  --  2.0  --   --   --   PHOS  --  2.9  --   --   --      Recent Results (from the past 240 hour(s))  Blood culture (routine x 2)     Status: None (Preliminary result)   Collection Time: 11/09/22  2:48 PM   Specimen: BLOOD  Result Value Ref Range Status   Specimen Description BLOOD RIGHT ANTECUBITAL  Final   Special Requests   Final    BOTTLES DRAWN AEROBIC AND ANAEROBIC Blood Culture results may not be optimal due to an inadequate volume of blood received in culture bottles   Culture   Final    NO GROWTH 4 DAYS Performed at [facility], [address] [place], [place] [address]     Report Status PENDING  Incomplete  Resp Panel by RT-PCR (Flu A&B, Covid) Anterior Nasal Swab     Status: None   Collection Time: 11/09/22  5:44 PM   Specimen: Anterior Nasal Swab  Result Value Ref Range Status   SARS Coronavirus 2 by RT PCR NEGATIVE NEGATIVE Final    Comment: (NOTE) SARS-CoV-2 target nucleic acids are NOT DETECTED.  The SARS-CoV-2 RNA is generally detectable in upper respiratory specimens during the acute phase of infection. The lowest concentration of SARS-CoV-2 viral copies this assay can detect is 138 copies/mL. A negative result does not preclude SARS-Cov-2 infection and should not be used as the sole basis for treatment or other patient management decisions. A negative result may occur with  improper  specimen collection/handling, submission of specimen other than nasopharyngeal swab, presence of viral mutation(s) within the areas targeted by this assay, and inadequate number of viral copies(<138 copies/mL). A negative result must be combined with clinical observations, patient history, and epidemiological information. The expected result is Negative.  Fact Sheet for Patients:  EntrepreneurPulse.com.au  Fact Sheet for Healthcare Providers:  IncredibleEmployment.be  This test is no t yet approved or cleared by the [place] FDA and  has been authorized for detection and/or diagnosis of SARS-CoV-2 by FDA under an Emergency Use Authorization (EUA). This EUA will remain  in effect (meaning this test can be used) for the duration of the COVID-19 declaration under Section 564(b)(1) of the Act, 21 U.S.C.section 360bbb-3(b)(1), unless the authorization is terminated  or revoked sooner.       Influenza A by PCR NEGATIVE NEGATIVE Final   Influenza B by PCR NEGATIVE NEGATIVE Final    Comment: (NOTE) The Xpert Xpress SARS-CoV-2/FLU/RSV plus assay is intended as an aid in the diagnosis of influenza from Nasopharyngeal swab  specimens and should not be used as a sole basis for treatment. Nasal washings and aspirates are unacceptable for Xpert Xpress SARS-CoV-2/FLU/RSV testing.  Fact Sheet for Patients: EntrepreneurPulse.com.au  Fact Sheet for Healthcare Providers: IncredibleEmployment.be  This test is not yet approved or cleared by the [place] FDA and has been authorized for detection and/or diagnosis of SARS-CoV-2 by FDA under an Emergency Use Authorization (EUA). This EUA will remain in effect (meaning this test can be used) for the duration of the COVID-19 declaration under Section 564(b)(1) of the Act, 21 U.S.C. section 360bbb-3(b)(1), unless the authorization is terminated or revoked.  Performed at [facility], [address], [place], [place] [address]   Urine Culture     Status: Abnormal   Collection Time: 11/09/22  5:50 PM   Specimen: Urine, Random  Result Value Ref Range Status   Specimen Description   Final    URINE, RANDOM Performed at [facility], [address], [place], [place] [address]    Special Requests   Final    NONE Performed at [facility], [address] [place], [place] [address]    Culture MULTIPLE SPECIES PRESENT, SUGGEST RECOLLECTION (A)  Final   Report Status 11/11/2022 FINAL  Final  Blood culture (routine x 2)     Status: None (Preliminary result)   Collection Time: 11/10/22  1:07 AM   Specimen: BLOOD  Result Value Ref Range Status   Specimen Description BLOOD RIGHT HAND  Final   Special Requests   Final    BOTTLES DRAWN AEROBIC AND ANAEROBIC Blood Culture adequate volume   Culture   Final    NO GROWTH 3 DAYS Performed at [facility], [address] [place], [place] [address]    Report Status PENDING  Incomplete     Radiology Studies: DG Chest Port 1 View  Result Date: 11/12/2022 CLINICAL DATA:  Chest pain EXAM: PORTABLE CHEST 1 VIEW COMPARISON:  11/09/2022 FINDINGS:  Cardiac shadow is within normal limits. Lungs are well aerated bilaterally. No focal infiltrate or sizable effusion is seen. Central vascular congestion is noted without edema. Bilateral breast implants are noted. Old healed rib fractures are noted bilaterally. IMPRESSION: Mild vascular congestion.  No other focal abnormality is noted. Electronically Signed   By: Inez Catalina M.D.   On: 11/12/2022 01:28   DG Finger Little Right  Result Date: 11/11/2022 CLINICAL DATA:  Finger injury, superficial. Infection. Patient felt a  pop and finger 1 week ago. EXAM: RIGHT LITTLE FINGER 2+V COMPARISON:  None Available. FINDINGS: There is oblique linear lucency within the mid and distal shaft and distal metaphysis of the proximal phalanx of fifth finger, extending from the lateral cortex of the distal metaphysis through the medial cortex of the proximal shaft. There is up to 1.5 mm lateral displacement of the distal fracture component with respect to the proximal fracture component. No significant AP dimension displacement. Moderate DIP and mild PIP joint space narrowing. No dislocation. IMPRESSION: Acute to subacute minimally displaced oblique fracture of the proximal phalanx of the fifth finger. Electronically Signed   By: Yvonne Kendall M.D.   On: 11/11/2022 14:43    Scheduled Meds:  apixaban  5 mg Oral BID   digoxin  125 mcg Oral Daily   feeding supplement  237 mL Oral BID BM   folic acid  1 mg Oral Daily   lidocaine  1 patch Transdermal Q24H   metoprolol tartrate  50 mg Oral BID   multivitamin with minerals  1 tablet Oral Daily   pantoprazole  40 mg Oral Daily   rosuvastatin  10 mg Oral Daily   [START ON 11/14/2022] Vitamin D (Ergocalciferol)  50,000 Units Oral Q Sun   Continuous Infusions:     LOS: 3 days   Shelly Coss, MD [facility] P12/01/2022, 12:45 PM

## 2022-11-13 NOTE — TOC Progression Note (Addendum)
Transition of Care [facility]) - Progression Note    Patient Details  Name: TREVOR DUTY MRN: [id number] Date of Birth: Jul 02, 1946  Transition of Care [facility]) CM/SW Boyd, LCSW Phone Number: 11/13/2022, 8:30 AM  Clinical Narrative:    Awaiting bed offers.  CSW asked Tammy at [facility] and Magda Paganini at [facility] to review referrals that were sent.   9:50- Spoke to [place] at [facility] who is able to accept patient and will have a bed either Monday or Tuesday. Updated nephew Ronalee Belts who would like to accept bed at [facility].  Called [facility] and spoke to [place], started auth for SNF [facility] and [facility] [facility] [employer].  Expected Discharge Plan: [place] Barriers to Discharge: Continued Medical Work up  Expected Discharge Plan and Services Expected Discharge Plan: [place] Choice: [place] arrangements for the past 2 months: Single Family Home                           HH Arranged: RN, PT, OT, Nurse's Aide, Social Work [employer] Agency: [place] Date [place]: 11/11/22   Representative spoke with at [place]: [place]   Social Determinants of Health ([place]) Interventions    Readmission Risk Interventions     No data to display

## 2022-11-14 DIAGNOSIS — G9341 Metabolic encephalopathy: Secondary | ICD-10-CM | POA: Diagnosis not present

## 2022-11-14 LAB — BASIC METABOLIC PANEL
Anion gap: 10 (ref 5–15)
BUN: 30 mg/dL — ABNORMAL HIGH (ref 8–23)
CO2: 21 mmol/L — ABNORMAL LOW (ref 22–32)
Calcium: 8.7 mg/dL — ABNORMAL LOW (ref 8.9–10.3)
Chloride: 104 mmol/L (ref 98–111)
Creatinine, Ser: 1.1 mg/dL — ABNORMAL HIGH (ref 0.44–1.00)
GFR, Estimated: 52 mL/min — ABNORMAL LOW (ref >60)
Glucose, Bld: 79 mg/dL (ref 70–99)
Potassium: 4.5 mmol/L (ref 3.5–5.1)
Sodium: 135 mmol/L (ref 135–145)

## 2022-11-14 LAB — CULTURE, BLOOD (ROUTINE X 2): Culture: NO GROWTH

## 2022-11-14 LAB — DIGOXIN LEVEL: Digoxin Level: 2.4 ng/mL — ABNORMAL HIGH (ref 0.8–2.0)

## 2022-11-14 MED ORDER — LOPERAMIDE HCL 2 MG PO CAPS
2.0000 mg | ORAL_CAPSULE | Freq: Four times a day (QID) | ORAL | Status: DC | PRN
  Administered 2022-11-14 – 2022-11-15 (×2): 2 mg via ORAL
  Filled 2022-11-14 (×2): qty 1

## 2022-11-14 MED ORDER — METOPROLOL TARTRATE 50 MG PO TABS
50.0000 mg | ORAL_TABLET | Freq: Three times a day (TID) | ORAL | Status: DC
  Administered 2022-11-14 – 2022-11-18 (×13): 50 mg via ORAL
  Filled 2022-11-14 (×13): qty 1

## 2022-11-14 MED ORDER — MIDODRINE HCL 5 MG PO TABS
2.5000 mg | ORAL_TABLET | Freq: Three times a day (TID) | ORAL | Status: DC
  Administered 2022-11-14 – 2022-11-18 (×13): 2.5 mg via ORAL
  Filled 2022-11-14 (×14): qty 1

## 2022-11-14 MED ORDER — CYCLOBENZAPRINE HCL 5 MG PO TABS
7.5000 mg | ORAL_TABLET | Freq: Three times a day (TID) | ORAL | Status: DC | PRN
  Administered 2022-11-15 – 2022-11-18 (×7): 7.5 mg via ORAL
  Filled 2022-11-14 (×9): qty 1.5

## 2022-11-14 NOTE — Progress Notes (Signed)
PROGRESS NOTE  ARIANNY PUN  QZE:[id number] DOB: June 14, 1946 DOA: 11/09/2022 PCP: Jodi Marble, MD   Brief Narrative: Patient is a 76 year old female with history of paroxysmal A-fib on Eliquis, hypertension, hyperlipidemia, insomnia who was brought to the emergency department from home with complaints of confusion, fall, back pain.  On presentation, she was hemodynamically stable ,on room air.  She complained of dysuria.  Patient has history of multiple hospitalizations recently, last one being on November this year when she was admitted with the same.  She lives alone with caretaker support.  UTI was suspected on admission this time but urine culture showed  some multiple species.  PT/OT recommending SNF on discharge.  Hospital course remarkable for persistent A-fib with RVR, cardiology consulted today.  Assessment & Plan:  Principal Problem:   Acute metabolic encephalopathy Active Problems:   SIRS (systemic inflammatory response syndrome) (HCC)   Syncope   Fibromyalgia syndrome   Generalized anxiety disorder   Chronic pain syndrome   Hypertension, benign   Memory change   HLD (hyperlipidemia)   Paroxysmal atrial fibrillation with RVR (HCC)   Stage 3b chronic kidney disease (CKD) (HCC)   UTI (urinary tract infection)   Encephalopathy  A-fib with RVR/Paroxysmal A-fib: Went into A-fib with RVR since the morning of 12/2.  Patient is asymptomatic.  Monitor on telemetry.  Patient is on digoxin, metoprolol.  On Eliquis for anticoagulation.  Continue current medications.  Cardiology consulted.  Blood pressure soft   Acute metabolic encephalopathy: Thought to be secondary to UTI, likely underlying dementia.   MRI of the brain  did not show any acute findings.  Normal vitamin B12, TSH level.  Monitor mental status, delirium precautions, avoid sedatives/narcotics as much as possible. This morning she is alert and oriented.  UTI: Complained of dysuria on presentation.  Urine culture  showed multiple species.  She completed 3 days of IV antibiotics with ceftriaxone. We will discontinue antibiotics.  Chest pain: Reproducible chest pain.  Tenderness on sternum on palpation.  This is most likely musculoskeletal pain.  Continue pain management, added ibuprofen, Protonix.  Lidocaine patch.  Troponins not significantly elevated, flat trend.Pain has improved  Recurrent fall/L2 compression fracture: Compression fracture is chronic.  She lives alone.  She was given TSLO brace on last admission which is making her uncomfortable.  Her fracture is stable.  Brace discontinued.  Continue pain medications, muscle relaxants,supportive care. PT/OT recommending SNF placement.  TOC following.  Nephew also requesting for SNF  Stage III CKD: Currently kidney function at baseline.  Insomnia: Uses Ambien at night.  Try to avoid benzodiazepines as much as possible.  Goals of care: Elderly patient with multiple admissions recently.  Palliative care consulted for goals of care. CODE STATUS is DNR       DVT prophylaxis:Place TED hose Start: 11/09/22 1801 apixaban (ELIQUIS) tablet 5 mg     Code Status: DNR  Family Communication: Duane Boston on phone on 12/1  Patient status:Inpatient  Patient is from :Home  Anticipated discharge to: SNF  Estimated DC date: After improvement in the heart rate and availability of bed on SNF   Consultants: Cardiology  Procedures: None  Antimicrobials:  Anti-infectives (From admission, onward)    Start     Dose/Rate Route Frequency Ordered Stop   11/09/22 1930  cefTRIAXone (ROCEPHIN) 2 g in sodium chloride 0.9 % 100 mL IVPB  Status:  Discontinued        2 g 200 mL/hr over 30 Minutes Intravenous Every 24 hours  11/09/22 1850 11/12/22 0812       Subjective:  Patient seen and examined at bedside today.  Hemodynamically stable but remains in A-fib with heart rate ranging from 110-140.  Remains asymptomatic.  Continues to complain of some back pain,  hip pain but chest pain has subsided.  Very anxious and worried about her disposition  Objective: Vitals:   11/13/22 1544 11/13/22 1924 11/14/22 0437 11/14/22 0821  BP: (!) 142/91 113/77 (!) 93/55 108/72  Pulse: 91 (!) 102 74 (!) 107  Resp: '18 17 20 18  '$ Temp: 97.7 F (36.5 C) 98.1 F (36.7 C) 98.4 F (36.9 C) 98.8 F (37.1 C)  TempSrc: Oral  Oral Oral  SpO2: 93% 97% 93% 93%  Weight:      Height:        Intake/Output Summary (Last 24 hours) at 11/14/2022 1114 Last data filed at 11/14/2022 0449 Gross per 24 hour  Intake 270 ml  Output 700 ml  Net -430 ml    Filed Weights   11/09/22 1427  Weight: 50.8 kg    Examination:   General exam: Anxious, overall comfortable HEENT: PERRL Respiratory system:  no wheezes or crackles  Cardiovascular system: Irregularly irregular rhythm Gastrointestinal system: Abdomen is nondistended, soft and nontender. Central nervous system: Alert and oriented Extremities: No edema, no clubbing ,no cyanosis Skin: No rashes, no ulcers,no icterus     Data Reviewed: I have personally reviewed following labs and imaging studies  CBC: Recent Labs  Lab 11/09/22 1447 11/10/22 0531 11/12/22 0810 11/13/22 0545  WBC 11.8* 7.2 10.9* 8.8  HGB 13.7 10.6* 10.8* 9.8*  HCT 41.3 33.0* 31.6* 30.4*  MCV 92.0 94.6 90.8 95.3  PLT 210 176 173 025   Basic Metabolic Panel: Recent Labs  Lab 11/09/22 1447 11/09/22 1947 11/10/22 0531 11/12/22 0810 11/13/22 0545 11/14/22 0543  NA 141  --  144 139 139 135  K 3.6  --  3.1* 4.3 4.7 4.5  CL 110  --  114* 108 109 104  CO2 18*  --  21* 23 23 21*  GLUCOSE 98  --  106* 121* 112* 79  BUN 43*  --  31* 25* 32* 30*  CREATININE 1.35*  --  1.09* 1.13* 1.31* 1.10*  CALCIUM 9.4  --  8.6* 8.4* 8.4* 8.7*  MG  --  2.0  --   --   --   --   PHOS  --  2.9  --   --   --   --      Recent Results (from the past 240 hour(s))  Blood culture (routine x 2)     Status: None   Collection Time: 11/09/22  2:48 PM   Specimen:  BLOOD  Result Value Ref Range Status   Specimen Description BLOOD RIGHT ANTECUBITAL  Final   Special Requests   Final    BOTTLES DRAWN AEROBIC AND ANAEROBIC Blood Culture results may not be optimal due to an inadequate volume of blood received in culture bottles   Culture   Final    NO GROWTH 5 DAYS Performed at [facility], [address] [place], [place] [address]    Report Status 11/14/2022 FINAL  Final  Resp Panel by RT-PCR (Flu A&B, Covid) Anterior Nasal Swab     Status: None   Collection Time: 11/09/22  5:44 PM   Specimen: Anterior Nasal Swab  Result Value Ref Range Status   SARS Coronavirus 2 by RT PCR NEGATIVE NEGATIVE Final  Comment: (NOTE) SARS-CoV-2 target nucleic acids are NOT DETECTED.  The SARS-CoV-2 RNA is generally detectable in upper respiratory specimens during the acute phase of infection. The lowest concentration of SARS-CoV-2 viral copies this assay can detect is 138 copies/mL. A negative result does not preclude SARS-Cov-2 infection and should not be used as the sole basis for treatment or other patient management decisions. A negative result may occur with  improper specimen collection/handling, submission of specimen other than nasopharyngeal swab, presence of viral mutation(s) within the areas targeted by this assay, and inadequate number of viral copies(<138 copies/mL). A negative result must be combined with clinical observations, patient history, and epidemiological information. The expected result is Negative.  Fact Sheet for Patients:  EntrepreneurPulse.com.au  Fact Sheet for Healthcare Providers:  IncredibleEmployment.be  This test is no t yet approved or cleared by the [place] FDA and  has been authorized for detection and/or diagnosis of SARS-CoV-2 by FDA under an Emergency Use Authorization (EUA). This EUA will remain  in effect (meaning this test can be used) for the duration of  the COVID-19 declaration under Section 564(b)(1) of the Act, 21 U.S.C.section 360bbb-3(b)(1), unless the authorization is terminated  or revoked sooner.       Influenza A by PCR NEGATIVE NEGATIVE Final   Influenza B by PCR NEGATIVE NEGATIVE Final    Comment: (NOTE) The Xpert Xpress SARS-CoV-2/FLU/RSV plus assay is intended as an aid in the diagnosis of influenza from Nasopharyngeal swab specimens and should not be used as a sole basis for treatment. Nasal washings and aspirates are unacceptable for Xpert Xpress SARS-CoV-2/FLU/RSV testing.  Fact Sheet for Patients: EntrepreneurPulse.com.au  Fact Sheet for Healthcare Providers: IncredibleEmployment.be  This test is not yet approved or cleared by the [place] FDA and has been authorized for detection and/or diagnosis of SARS-CoV-2 by FDA under an Emergency Use Authorization (EUA). This EUA will remain in effect (meaning this test can be used) for the duration of the COVID-19 declaration under Section 564(b)(1) of the Act, 21 U.S.C. section 360bbb-3(b)(1), unless the authorization is terminated or revoked.  Performed at [facility], [address], [place], [place] [address]   Urine Culture     Status: Abnormal   Collection Time: 11/09/22  5:50 PM   Specimen: Urine, Random  Result Value Ref Range Status   Specimen Description   Final    URINE, RANDOM Performed at [facility], [address] [place], [place] [address]    Special Requests   Final    NONE Performed at [facility], [address] [place], [place] [address]    Culture MULTIPLE SPECIES PRESENT, SUGGEST RECOLLECTION (A)  Final   Report Status 11/11/2022 FINAL  Final  Blood culture (routine x 2)     Status: None (Preliminary result)   Collection Time: 11/10/22  1:07 AM   Specimen: BLOOD  Result Value Ref Range Status   Specimen Description BLOOD RIGHT HAND  Final   Special Requests    Final    BOTTLES DRAWN AEROBIC AND ANAEROBIC Blood Culture adequate volume   Culture   Final    NO GROWTH 4 DAYS Performed at [facility], [address] [place], [place] [address]    Report Status PENDING  Incomplete     Radiology Studies: No results found.  Scheduled Meds:  apixaban  5 mg Oral BID   digoxin  125 mcg Oral Daily   feeding supplement  237 mL Oral BID BM   folic  acid  1 mg Oral Daily   lidocaine  1 patch Transdermal Q24H   metoprolol tartrate  50 mg Oral BID   multivitamin with minerals  1 tablet Oral Daily   pantoprazole  40 mg Oral Daily   rosuvastatin  10 mg Oral Daily   Vitamin D (Ergocalciferol)  50,000 Units Oral Q Sun   Continuous Infusions:     LOS: 4 days   Shelly Coss, MD [facility] P12/02/2022, 11:14 AM

## 2022-11-14 NOTE — Consult Note (Signed)
CARDIOLOGY CONSULT NOTE               Patient ID: Donna Evans MRN: [id number] DOB/AGE: July 06, 1946 76 y.o.  Admit date: 11/09/2022 Referring Physician Dr. Shelly Coss Primary Physician Dr. Elijio Miles primary Primary Cardiologist Dr. Saralyn Pilar Reason for Consultation atrial fibrillation  HPI: 76 year old female paroxysmal atrial fibrillation on Eliquis expected to transition to Coumadin because of expense patient with history of hypertension hyperlipidemia hyperlipidemia insomnia syncope anxiety has had recurrent episodes of atrial fibrillation.  Patient feels asymptomatic but has had multiple episodes of syncope blackout spells unclear etiology patient initially was admitted with altered mental status hemodynamically stable workup was unremarkable patient since improved cardiology was consulted for progressive tachycardic episodes  Review of systems complete and found to be negative unless listed above     Past Medical History:  Diagnosis Date   Allergic state    Anginal pain ([place])    Prinzmetal's angina   Anxiety    Arthritis    osteoarthritis   Breast cancer ([place]) 1990   right breast cancer   Chronic pain    Chronic pain    COPD (chronic obstructive pulmonary disease) ([place])    Depression    Edema    Fibromyalgia    Foot fracture    Bilateral   GERD (gastroesophageal reflux disease)    History of kidney stones    Hypertension    Leg fracture, right    Low back pain    Lumbosacral neuritis    Medulloadrenal hyperfunction (H[facility])    Migraine headache    Peripheral neuropathy    Shoulder fracture, right    Stroke ([place])    TIA   Systemic lupus erythematosus ([place])    Thyroid disease    TIA (transient ischemic attack) 11/28/2013   Transient global amnesia 2011    Past Surgical History:  Procedure Laterality Date   ABDOMINAL HYSTERECTOMY  1981   APPENDECTOMY  1957   AUGMENTATION MAMMAPLASTY Bilateral 1990   saline submuscular   BACK SURGERY  1989    BILATERAL TOTAL MASTECTOMY WITH AXILLARY LYMPH NODE DISSECTION  1990   BREAST IMPLANT EXCHANGE Bilateral 05/11/2016   Procedure: REMOVAL AND REPLACEMENT OF BREAST IMPLANTS ;  Surgeon: Irene Limbo, MD;  Location: [place];  Service: Plastics;  Laterality: Bilateral;   CAPSULECTOMY Bilateral 05/11/2016   Procedure: CAPSULECTOMY CAPSULORRAPHY ;  Surgeon: Irene Limbo, MD;  Location: [place];  Service: Plastics;  Laterality: Bilateral;   CATARACT EXTRACTION W/PHACO Right 12/26/2018   Procedure: CATARACT EXTRACTION PHACO AND INTRAOCULAR LENS PLACEMENT ([place]) RIGHT;  Surgeon: Birder Robson, MD;  Location: [facility];  Service: Ophthalmology;  Laterality: Right;  [place]  00:46 CDE 8.50 Fluid pack lot # [id number]   CATARACT EXTRACTION W/PHACO Left 01/23/2019   Procedure: CATARACT EXTRACTION PHACO AND INTRAOCULAR LENS PLACEMENT ([place]) LEFT;  Surgeon: Birder Robson, MD;  Location: [facility];  Service: Ophthalmology;  Laterality: Left;  [place]  00:45 CDE  8.41 fluid pack lot # [id number]   CHOLECYSTECTOMY  1979   COLONOSCOPY WITH PROPOFOL N/A 01/25/2018   Procedure: COLONOSCOPY WITH PROPOFOL;  Surgeon: Manya Silvas, MD;  Location: [facility];  Service: Endoscopy;  Laterality: N/A;   COPD     ESOPHAGOGASTRODUODENOSCOPY (EGD) WITH PROPOFOL N/A 03/07/2018   Procedure: ESOPHAGOGASTRODUODENOSCOPY (EGD) WITH PROPOFOL;  Surgeon: Manya Silvas, MD;  Location: [facility] END[facility]COPY;  Service: Endoscopy;  Laterality: N/A;   FRACTURE SURGERY     HARDWARE REMOVAL Left 06/18/2020  Procedure: HARDWARE REMOVAL;  Surgeon: Lovell Sheehan, MD;  Location: [facility];  Service: Orthopedics;  Laterality: Left;   HUMERUS IM NAIL Left 12/31/2019   Procedure: INTRAMEDULLARY (IM) NAIL HUMERAL;  Surgeon: Lovell Sheehan, MD;  Location: [facility];  Service: Orthopedics;  Laterality: Left;   LIPOSUCTION Right 05/11/2016   Procedure: LIPOSUCTION;  Surgeon: Irene Limbo, MD;  Location: [place];  Service: Plastics;  Laterality: Right;   LIVER BIOPSY  2011   MASTECTOMY SUBCUTANEOUS Bilateral 1990   MASTOPEXY Bilateral 05/11/2016   Procedure: MASTOPEXY BILATERAL ;  Surgeon: Irene Limbo, MD;  Location: [place];  Service: Plastics;  Laterality: Bilateral;    Medications Prior to Admission  Medication Sig Dispense Refill Last Dose   alum & mag hydroxide-simeth (MAALOX/MYLANTA) 200-200-20 MG/5ML suspension Take 15 mLs by mouth every 6 (six) hours as needed for indigestion or heartburn.   Past Week   apixaban (ELIQUIS) 5 MG TABS tablet Take 1 tablet (5 mg total) by mouth 2 (two) times daily. 60 tablet 0 Past Week   Cyanocobalamin (VITAMIN B-12 IJ) Inject 1,000 mg as directed every 30 (thirty) days.   Past Week   cyclobenzaprine (FLEXERIL) 10 MG tablet Take 10 mg by mouth 3 (three) times daily.   Past Week   digoxin (LANOXIN) 0.125 MG tablet Take 1 tablet (125 mcg total) by mouth daily for 7 days. 7 tablet 0 Past Week   folic acid (FOLVITE) 1 MG tablet Take 1 tablet (1 mg total) by mouth daily. 30 tablet 0 Past Week   metoprolol tartrate (LOPRESSOR) 50 MG tablet Take 1 tablet (50 mg total) by mouth 2 (two) times daily. 60 tablet 0 Past Week   rosuvastatin (CRESTOR) 10 MG tablet Take 10 mg by mouth daily.   Past Week   Vitamin D, Ergocalciferol, 50000 units CAPS Take 1 capsule by mouth once a week.   Past Week   zolpidem (AMBIEN) 10 MG tablet Take 0.5 tablets (5 mg total) by mouth at bedtime.  0 Past Week   calcium carbonate (TUMS - DOSED IN MG ELEMENTAL CALCIUM) 500 MG chewable tablet Chew 1 tablet by mouth 3 (three) times daily as needed for indigestion or heartburn.   prn   Social History   Socioeconomic History   Marital status: Married    Spouse name: Not on file   Number of children: 1   Years of education: college3   Highest education level: Not on file  Occupational History   Occupation: Retired  Tobacco Use   Smoking status: Former     Packs/day: 1.00    Years: 40.00    Total pack years: 40.00    Types: Cigarettes    Quit date: 08/28/2005    Years since quitting: 17.2   Smokeless tobacco: Never  Vaping Use   Vaping Use: Never used  Substance and Sexual Activity   Alcohol use: Not Currently   Drug use: No   Sexual activity: Never  Other Topics Concern   Not on file  Social History Narrative   Not on file   Social Determinants of Health   Financial Resource Strain: Not on file  Food Insecurity: No Food Insecurity (11/10/2022)   Hunger Vital Sign    Worried About Running Out of Food in the Last Year: Never true    Ran Out of Food in the Last Year: Never true  Transportation Needs: No Transportation Needs (11/10/2022)   PRAPARE - Transportation    Lack  of Transportation (Medical): No    Lack of Transportation (Non-Medical): No  Physical Activity: Not on file  Stress: Not on file  Social Connections: Not on file  Intimate Partner Violence: Not At Risk (11/10/2022)   Humiliation, Afraid, Rape, and Kick questionnaire    Fear of Current or Ex-Partner: No    Emotionally Abused: No    Physically Abused: No    Sexually Abused: No    Family History  Problem Relation Age of Onset   Atrial fibrillation Mother    Atrial fibrillation Sister    Cancer Sister    Diabetes Sister    Breast cancer Sister 46   Diabetes Father    Cancer Sister    Diabetes Sister    Atrial fibrillation Sister    Kidney disease Maternal Aunt       Review of systems complete and found to be negative unless listed above      PHYSICAL EXAM  General: Well developed, well nourished, in no acute distress HEENT:  Normocephalic and atramatic Neck:  No JVD.  Lungs: Clear bilaterally to auscultation and percussion. Heart: HRRR . Normal S1 and S2 without gallops or murmurs.  Abdomen: Bowel sounds are positive, abdomen soft and non-tender  Msk:  Back normal, normal gait. Normal strength and tone for age. Extremities: No clubbing,  cyanosis or edema.   Neuro: Alert and oriented X 3. Psych:  Good affect, responds appropriately  Labs:   Lab Results  Component Value Date   WBC 8.8 11/13/2022   HGB 9.8 (L) 11/13/2022   HCT 30.4 (L) 11/13/2022   MCV 95.3 11/13/2022   PLT 160 11/13/2022    Recent Labs  Lab 11/09/22 1447 11/10/22 0531 11/14/22 0543  NA 141   < > 135  K 3.6   < > 4.5  CL 110   < > 104  CO2 18*   < > 21*  BUN 43*   < > 30*  CREATININE 1.35*   < > 1.10*  CALCIUM 9.4   < > 8.7*  PROT 8.5*  --   --   BILITOT 1.6*  --   --   ALKPHOS 913*  --   --   ALT 50*  --   --   AST 47*  --   --   GLUCOSE 98   < > 79   < > = values in this interval not displayed.   Lab Results  Component Value Date   CKTOTAL 111 11/09/2022   CKMB 2.2 02/29/2012   TROPONINI <0.03 07/21/2018    Lab Results  Component Value Date   CHOL 111 04/29/2022   CHOL 203 (A) 04/28/2015   Lab Results  Component Value Date   HDL 42 04/29/2022   HDL 59 04/28/2015   Lab Results  Component Value Date   LDLCALC 46 04/29/2022   LDLCALC 116 04/28/2015   Lab Results  Component Value Date   TRIG 114 04/29/2022   TRIG 141 04/28/2015   Lab Results  Component Value Date   CHOLHDL 2.6 04/29/2022   No results found for: "LDLDIRECT"    Radiology: [facility] Chest Port 1 View  Result Date: 11/12/2022 CLINICAL DATA:  Chest pain EXAM: PORTABLE CHEST 1 VIEW COMPARISON:  11/09/2022 FINDINGS: Cardiac shadow is within normal limits. Lungs are well aerated bilaterally. No focal infiltrate or sizable effusion is seen. Central vascular congestion is noted without edema. Bilateral breast implants are noted. Old healed rib fractures are noted bilaterally. IMPRESSION: Mild vascular  congestion.  No other focal abnormality is noted. Electronically Signed   By: Inez Catalina M.D.   On: 11/12/2022 01:28   DG Finger Little Right  Result Date: 11/11/2022 CLINICAL DATA:  Finger injury, superficial. Infection. Patient felt a pop and finger 1 week ago.  EXAM: RIGHT LITTLE FINGER 2+V COMPARISON:  None Available. FINDINGS: There is oblique linear lucency within the mid and distal shaft and distal metaphysis of the proximal phalanx of fifth finger, extending from the lateral cortex of the distal metaphysis through the medial cortex of the proximal shaft. There is up to 1.5 mm lateral displacement of the distal fracture component with respect to the proximal fracture component. No significant AP dimension displacement. Moderate DIP and mild PIP joint space narrowing. No dislocation. IMPRESSION: Acute to subacute minimally displaced oblique fracture of the proximal phalanx of the fifth finger. Electronically Signed   By: Yvonne Kendall M.D.   On: 11/11/2022 14:43   MR BRAIN WO CONTRAST  Result Date: 11/09/2022 CLINICAL DATA:  Stroke follow-up EXAM: MRI HEAD WITHOUT CONTRAST TECHNIQUE: Multiplanar, multiecho pulse sequences of the brain and surrounding structures were obtained without intravenous contrast. COMPARISON:  None Available. FINDINGS: Brain: No acute infarct, mass effect or extra-axial collection. No acute or chronic hemorrhage. There is multifocal hyperintense T2-weighted signal within the white matter. Generalized volume loss. The midline structures are normal. Vascular: Major flow voids are preserved. Skull and upper cervical spine: Normal calvarium and skull base. Visualized upper cervical spine and soft tissues are normal. Sinuses/Orbits:Fluid in the right maxillary and sphenoid sinuses. No mastoid or middle ear effusion. Normal orbits. IMPRESSION: 1. No acute intracranial abnormality. 2. Findings of chronic small vessel ischemia and volume loss. Electronically Signed   By: Ulyses Jarred M.D.   On: 11/09/2022 19:02   CT CHEST ABDOMEN PELVIS W CONTRAST  Result Date: 11/09/2022 CLINICAL DATA:  Blunt trauma. Patient found down for unknown period of time. Uncooperative patient. EXAM: CT CHEST, ABDOMEN, AND PELVIS WITH CONTRAST TECHNIQUE: Multidetector  CT imaging of the chest, abdomen and pelvis was performed following the standard protocol during bolus administration of intravenous contrast. RADIATION DOSE REDUCTION: This exam was performed according to the departmental dose-optimization program which includes automated exposure control, adjustment of the mA and/or kV according to patient size and/or use of iterative reconstruction technique. CONTRAST:  64m OMNIPAQUE IOHEXOL 300 MG/ML  SOLN COMPARISON:  None Available. FINDINGS: CT CHEST FINDINGS Cardiovascular: No significant vascular findings. Normal heart size. No pericardial effusion. Mediastinum/Nodes: No axillary or supraclavicular adenopathy. No mediastinal or hilar adenopathy. No pericardial fluid. Esophagus normal. Lungs/Pleura: No pneumothorax. Pulmonary contusion. No pleural fluid. No pneumonia. Biapical pleuroparenchymal thickening favored benign. Centrilobular emphysema the upper lobes. Musculoskeletal: Healed posterior rib fractures. No acute rib fracture. CT ABDOMEN AND PELVIS FINDINGS Hepatobiliary: No hepatic laceration. Postcholecystectomy. Mild intra and extra hepatic biliary duct dilatation is favored chronic. Pancreas: Pancreas is normal. No ductal dilatation. No pancreatic inflammation. Spleen: No splenic injury. Adrenals/urinary tract: Thickening LEFT adrenal gland to 2 cm (image 51/4. Washout of contrast (greater than 40%) on delayed imaging indicates benign adrenal adenoma. Bilateral renal cortical thinning. Ureters normal. Bladder intact Stomach/Bowel: The stomach, duodenum, and small bowel normal. The colon and rectosigmoid colon are normal. Vascular/Lymphatic: Abdominal aorta is normal caliber with atherosclerotic calcification. There is no retroperitoneal or periportal lymphadenopathy. No pelvic lymphadenopathy. Reproductive: Post hysterectomy.  Adnexa unremarkable Other: No free fluid. Musculoskeletal: Superior endplate compression fracture at L2. There is sclerosis along the  depressed fracture plane. No paraspinal  hematoma evident. Approximately 26% loss vertebral body height centrally (image 84/8 and image 52/7). No significant retropulsion. IMPRESSION: Chest Impression: 1. No evidence of thoracic trauma. 2. Bilateral most rib fractures. 3. Biapical chronic pleuroparenchymal scarring. Abdomen / Pelvis Impression: 1. Age-indeterminate compression fracture at L2. Proximally 20% loss vertebral body height. No significant retropulsion. 2. Bilateral renal cortical thinning. 3. Mild biliary duct dilatation following cholecystectomy. Consider correlation with bilirubin levels. 4. Benign LEFT adrenal adenoma. Electronically Signed   By: Suzy Bouchard M.D.   On: 11/09/2022 16:07   CT Cervical Spine Wo Contrast  Result Date: 11/09/2022 CLINICAL DATA:  Found down EXAM: CT CERVICAL SPINE WITHOUT CONTRAST TECHNIQUE: Multidetector CT imaging of the cervical spine was performed without intravenous contrast. Multiplanar CT image reconstructions were also generated. RADIATION DOSE REDUCTION: This exam was performed according to the departmental dose-optimization program which includes automated exposure control, adjustment of the mA and/or kV according to patient size and/or use of iterative reconstruction technique. COMPARISON:  11/01/2022 FINDINGS: Alignment: Normal. Skull base and vertebrae: No acute fracture. No primary bone lesion or focal pathologic process. Soft tissues and spinal canal: No prevertebral fluid or swelling. No visible canal hematoma. Disc levels: Multilevel degenerative disc disease noted with loss of disc space and marginal osteophytes from the C3 through the C7 levels. There is osteoarthritis identified at C1-C2. Facet joint degenerative changes noted most severely at C3-4 on the right and bilaterally at C4-5. Upper chest: Negative. Other: None. IMPRESSION: Multilevel degenerative changes.  No acute traumatic abnormalities. Electronically Signed   By: Sammie Bench  M.D.   On: 11/09/2022 15:55   CT Head Wo Contrast  Result Date: 11/09/2022 CLINICAL DATA:  Mental status change, unknown cause EXAM: CT HEAD WITHOUT CONTRAST TECHNIQUE: Contiguous axial images were obtained from the base of the skull through the vertex without intravenous contrast. RADIATION DOSE REDUCTION: This exam was performed according to the departmental dose-optimization program which includes automated exposure control, adjustment of the mA and/or kV according to patient size and/or use of iterative reconstruction technique. COMPARISON:  11/01/2022 FINDINGS: Brain: There is periventricular white matter decreased attenuation consistent with small vessel ischemic changes. Ventricles, sulci and cisterns are prominent consistent with age related involutional changes. No acute intracranial hemorrhage, mass effect or shift. No hydrocephalus. Vascular: No hyperdense vessel or unexpected calcification. Skull: Normal. Negative for fracture or focal lesion. Sinuses/Orbits: No acute finding. IMPRESSION: Atrophy and chronic small vessel ischemic changes. No acute intracranial process identified. Electronically Signed   By: Sammie Bench M.D.   On: 11/09/2022 15:53   DG Chest 1 View  Result Date: 11/09/2022 CLINICAL DATA:  Altered mental status EXAM: CHEST  1 VIEW COMPARISON:  10/29/2022 FINDINGS: Remote proximal left humerus fracture. Remote posterior upper left rib fractures. Patient rotated to the right. Midline trachea. Normal heart size. Mild right hemidiaphragm elevation. No pleural effusion or pneumothorax. Bilateral breast implants, projecting over the lungs. No lobar consolidation. IMPRESSION: No acute cardiopulmonary disease. Electronically Signed   By: Abigail Miyamoto M.D.   On: 11/09/2022 15:20   MR BRAIN WO CONTRAST  Result Date: 11/02/2022 CLINICAL DATA:  Fall, altered mental status EXAM: MRI HEAD WITHOUT CONTRAST TECHNIQUE: Multiplanar, multiecho pulse sequences of the brain and surrounding  structures were obtained without intravenous contrast. COMPARISON:  10/06/2022 MRI head, correlation is also made with CT head 11/01/2022 FINDINGS: Brain: No restricted diffusion to suggest acute or subacute infarct. No acute hemorrhage, mass, mass effect, or midline shift. No hydrocephalus or extra-axial collection. Scattered and confluent  T2 hyperintense signal in the periventricular white matter, likely the sequela of mild-to-moderate chronic small vessel ischemic disease. Vascular: Normal arterial flow voids. Skull and upper cervical spine: Normal marrow signal. Left parietal scalp hematoma. Sinuses/Orbits: Air-fluid levels in the right maxillary and sphenoid sinuses. Status post bilateral lens replacements. Other: Trace fluid in bilateral mastoid tips. IMPRESSION: 1. No acute intracranial process. 2. Air-fluid levels in the right maxillary and sphenoid sinuses, which are nonspecific but can be seen in the setting of acute sinusitis. Electronically Signed   By: Merilyn Baba M.D.   On: 11/02/2022 00:23   CT HEAD WO CONTRAST (5MM)  Result Date: 11/01/2022 CLINICAL DATA:  Head trauma, minor (Age >= 65y) Pt on Eliquis; Neck trauma (Age >= 65y) EXAM: CT HEAD WITHOUT CONTRAST CT CERVICAL SPINE WITHOUT CONTRAST TECHNIQUE: Multidetector CT imaging of the head and cervical spine was performed following the standard protocol without intravenous contrast. Multiplanar CT image reconstructions of the cervical spine were also generated. RADIATION DOSE REDUCTION: This exam was performed according to the departmental dose-optimization program which includes automated exposure control, adjustment of the mA and/or kV according to patient size and/or use of iterative reconstruction technique. COMPARISON:  None Available. FINDINGS: CT HEAD FINDINGS Brain: No evidence of acute infarction, hemorrhage, hydrocephalus, extra-axial collection or mass lesion/mass effect. Patchy white matter hypodensities, compatible with chronic  microvascular ischemic disease. Vascular: No hyperdense vessel. Skull: No acute fracture.  High left posterior scalp contusion. Sinuses/Orbits: Frothy secretions in the right sphenoid and maxillary sinuses. Other: No mastoid effusions. CT CERVICAL SPINE FINDINGS Alignment: Normal. Skull base and vertebrae: No evidence of acute fracture vertebral body heights are maintained. Soft tissues and spinal canal: No prevertebral fluid or swelling. No visible canal hematoma. Disc levels: Moderate multilevel degenerative disc disease. Multilevel facet and uncovertebral hypertrophy with varying degrees of neural foraminal stenosis. Upper chest: Biapical pleuroparenchymal scarring.  Emphysema. IMPRESSION: 1. No evidence of acute intracranial abnormality. High left posterior scalp contusion. 2. No evidence of acute fracture or traumatic malalignment in the cervical spine. Multilevel degenerative change. 3.  Emphysema (ICD10-J43.9). Electronically Signed   By: Margaretha Sheffield M.D.   On: 11/01/2022 15:40   CT Cervical Spine Wo Contrast  Result Date: 11/01/2022 CLINICAL DATA:  Head trauma, minor (Age >= 65y) Pt on Eliquis; Neck trauma (Age >= 65y) EXAM: CT HEAD WITHOUT CONTRAST CT CERVICAL SPINE WITHOUT CONTRAST TECHNIQUE: Multidetector CT imaging of the head and cervical spine was performed following the standard protocol without intravenous contrast. Multiplanar CT image reconstructions of the cervical spine were also generated. RADIATION DOSE REDUCTION: This exam was performed according to the departmental dose-optimization program which includes automated exposure control, adjustment of the mA and/or kV according to patient size and/or use of iterative reconstruction technique. COMPARISON:  None Available. FINDINGS: CT HEAD FINDINGS Brain: No evidence of acute infarction, hemorrhage, hydrocephalus, extra-axial collection or mass lesion/mass effect. Patchy white matter hypodensities, compatible with chronic microvascular  ischemic disease. Vascular: No hyperdense vessel. Skull: No acute fracture.  High left posterior scalp contusion. Sinuses/Orbits: Frothy secretions in the right sphenoid and maxillary sinuses. Other: No mastoid effusions. CT CERVICAL SPINE FINDINGS Alignment: Normal. Skull base and vertebrae: No evidence of acute fracture vertebral body heights are maintained. Soft tissues and spinal canal: No prevertebral fluid or swelling. No visible canal hematoma. Disc levels: Moderate multilevel degenerative disc disease. Multilevel facet and uncovertebral hypertrophy with varying degrees of neural foraminal stenosis. Upper chest: Biapical pleuroparenchymal scarring.  Emphysema. IMPRESSION: 1. No evidence of acute intracranial  abnormality. High left posterior scalp contusion. 2. No evidence of acute fracture or traumatic malalignment in the cervical spine. Multilevel degenerative change. 3.  Emphysema (ICD10-J43.9). Electronically Signed   By: Margaretha Sheffield M.D.   On: 11/01/2022 15:40   DG Chest Portable 1 View  Result Date: 10/29/2022 CLINICAL DATA:  Syncope. EXAM: PORTABLE CHEST 1 VIEW COMPARISON:  CT of the chest 03/22/2014. chest x-ray 10/06/2022. FINDINGS: The heart size and mediastinal contours are within normal limits. Both lungs are clear. There are healed left eighth and ninth rib fractures which are new from prior study. Healed left humeral fracture again noted. IMPRESSION: No active disease. Electronically Signed   By: Ronney Asters M.D.   On: 10/29/2022 18:29   CT Head Wo Contrast  Result Date: 10/29/2022 CLINICAL DATA:  Syncopal episode lasting 10 seconds. EXAM: CT HEAD WITHOUT CONTRAST TECHNIQUE: Contiguous axial images were obtained from the base of the skull through the vertex without intravenous contrast. RADIATION DOSE REDUCTION: This exam was performed according to the departmental dose-optimization program which includes automated exposure control, adjustment of the mA and/or kV according to  patient size and/or use of iterative reconstruction technique. COMPARISON:  CT head without contrast 10/07/2022. MR head without contrast 10/06/2022. FINDINGS: Brain: Moderate atrophy and white matter changes are stable. No acute infarct, hemorrhage, or mass lesion is present. The ventricles are proportionate to the degree of atrophy. No significant extraaxial fluid collection is present. Basal ganglia are intact. The brainstem and cerebellum are within normal limits. Vascular: No hyperdense vessel or unexpected calcification. Skull: Calvarium is intact. No focal lytic or blastic lesions are present. No significant extracranial soft tissue lesion is present. Sinuses/Orbits: Fluid is present in the right sphenoid sinus. Fluid is also present right maxillary sinus. The paranasal sinuses and mastoid air cells are otherwise clear. Bilateral lens replacements are noted. Globes and orbits are otherwise unremarkable. IMPRESSION: 1. No acute intracranial abnormality or significant interval change. 2. Stable atrophy and white matter disease. This likely reflects the sequela of chronic microvascular ischemia. Electronically Signed   By: San Morelle M.D.   On: 10/29/2022 17:01    EKG: Paroxysmal atrial fibrillation rate of 100 nonspecific ST changes and abnormality I never met them as:   ASSESSMENT AND PLAN:  Paroxysmal atrial fibrillation Hypertension Hyperlipidemia Recurrent syncope unclear etiology Chronic renal sufficiency stage III History of recurrent acute encephalopathy metabolic encephalopathy History of systemic inflammatory response syndrome Generalized anxiety . Plan Recommend advancing metoprolol to 3 times a day Continue anticoagulation for atrial fibrillation on Coumadin Consider resuming digoxin Will consider starting amiodarone therapy for rhythm management and control Maintain anticoagulation for renal insufficiency Consider neurology evaluation for syncope Consider monitor for  evaluation of arrhythmia potentially contributing to syncope   Signed: Yolonda Kida MD 11/14/2022, 11:35 AM

## 2022-11-14 NOTE — TOC Progression Note (Addendum)
Transition of Care [facility]) - Progression Note    Patient Details  Name: Donna Evans MRN: [id number] Date of Birth: 12-09-1946  Transition of Care [facility]) CM/SW [place], LCSW Phone Number: 11/14/2022, 1:40 PM  Clinical Narrative:    Notified by Marguarite Arbour with [facility] who states SNF auth is still pending, but EMS was denied. Will await call about SNF auth outcome. Provided RNCM covering tomorrow's contact info to [place] as well if SNF does not come back today.    Expected Discharge Plan: [place] Barriers to Discharge: Continued Medical Work up  Expected Discharge Plan and Services Expected Discharge Plan: [place] Choice: [employer] arrangements for the past 2 months: Single Family Home                           HH Arranged: RN, PT, OT, Nurse's Aide, Social Work [employer] Agency: [place] Date [place]: 11/11/22   Representative spoke with at [place]: [place] [employer]   Social Determinants of Health ([place]) Interventions    Readmission Risk Interventions     No data to display

## 2022-11-14 NOTE — Progress Notes (Signed)
Mobility Specialist - Progress Note    11/14/22 1643  Mobility  Activity Ambulated with assistance in room  Level of Assistance Contact guard assist, steadying assist  Assistive Device Front wheel walker  Distance Ambulated (ft) 10 ft  Activity Response Tolerated well  Mobility Referral Yes  $Mobility charge 1 Mobility   Pt resting in bed on RA upon entry. Pt STS and ambulates to bathroom CGA. Pt returned to bed to perform LE/UE exercises before laying back in bed. Pt left in bed with needs in reach and bed alarm activated. Pt family present in room.   Loma Sender Mobility Specialist 11/14/22, 4:48 PM

## 2022-11-15 ENCOUNTER — Inpatient Hospital Stay: Payer: PPO

## 2022-11-15 DIAGNOSIS — G9341 Metabolic encephalopathy: Secondary | ICD-10-CM | POA: Diagnosis not present

## 2022-11-15 LAB — CBC
HCT: 33.8 % — ABNORMAL LOW (ref 36.0–46.0)
Hemoglobin: 11.2 g/dL — ABNORMAL LOW (ref 12.0–15.0)
MCH: 30.4 pg (ref 26.0–34.0)
MCHC: 33.1 g/dL (ref 30.0–36.0)
MCV: 91.6 fL (ref 80.0–100.0)
Platelets: 277 10*3/uL (ref 150–400)
RBC: 3.69 MIL/uL — ABNORMAL LOW (ref 3.87–5.11)
RDW: 14.1 % (ref 11.5–15.5)
WBC: 7.1 10*3/uL (ref 4.0–10.5)
nRBC: 0 % (ref 0.0–0.2)

## 2022-11-15 LAB — CULTURE, BLOOD (ROUTINE X 2)
Culture: NO GROWTH
Special Requests: ADEQUATE

## 2022-11-15 LAB — BASIC METABOLIC PANEL WITH GFR
Anion gap: 7 (ref 5–15)
BUN: 37 mg/dL — ABNORMAL HIGH (ref 8–23)
CO2: 23 mmol/L (ref 22–32)
Calcium: 8.2 mg/dL — ABNORMAL LOW (ref 8.9–10.3)
Chloride: 104 mmol/L (ref 98–111)
Creatinine, Ser: 1.34 mg/dL — ABNORMAL HIGH (ref 0.44–1.00)
GFR, Estimated: 41 mL/min — ABNORMAL LOW
Glucose, Bld: 106 mg/dL — ABNORMAL HIGH (ref 70–99)
Potassium: 4.1 mmol/L (ref 3.5–5.1)
Sodium: 134 mmol/L — ABNORMAL LOW (ref 135–145)

## 2022-11-15 MED ORDER — REGADENOSON 0.4 MG/5ML IV SOLN
0.4000 mg | Freq: Once | INTRAVENOUS | Status: AC
  Administered 2022-11-15: 0.4 mg via INTRAVENOUS

## 2022-11-15 MED ORDER — AMIODARONE HCL 200 MG PO TABS
200.0000 mg | ORAL_TABLET | Freq: Two times a day (BID) | ORAL | Status: DC
  Administered 2022-11-15 – 2022-11-18 (×6): 200 mg via ORAL
  Filled 2022-11-15 (×6): qty 1

## 2022-11-15 MED ORDER — TECHNETIUM TC 99M TETROFOSMIN IV KIT
10.0000 | PACK | Freq: Once | INTRAVENOUS | Status: AC | PRN
  Administered 2022-11-15: 9.96 via INTRAVENOUS

## 2022-11-15 MED ORDER — ONDANSETRON HCL 4 MG/2ML IJ SOLN
4.0000 mg | Freq: Three times a day (TID) | INTRAMUSCULAR | Status: DC | PRN
  Administered 2022-11-15: 4 mg via INTRAVENOUS
  Filled 2022-11-15: qty 2

## 2022-11-15 MED ORDER — DIGOXIN 125 MCG PO TABS
0.0625 mg | ORAL_TABLET | Freq: Every day | ORAL | Status: DC
  Administered 2022-11-16: 0.0625 mg via ORAL
  Filled 2022-11-15: qty 0.5

## 2022-11-15 MED ORDER — TECHNETIUM TC 99M TETROFOSMIN IV KIT
29.8000 | PACK | Freq: Once | INTRAVENOUS | Status: AC | PRN
  Administered 2022-11-15: 29.8 via INTRAVENOUS

## 2022-11-15 NOTE — Consult Note (Signed)
   [facility] Inpatient Consult   11/15/2022  Donna Evans [birth year] [id number]  [place] [facility] [ACO] Patient: [name] Liaison remote coverage review for patient admitted to [name]: [employer]  Primary Care Provider:  Jodi Marble, MD   Patient screened for less than 7 days readmission hospitalization with noted high risk score for unplanned readmission risk and to assess for potential [place] Management service needs for post hospital transition for care coordination.  Review of patient's electronic medical record reveals patient is being recommended for skilled nursing facility for rehab awaiting insurance authorization.   Plan:  Continue to follow progress and disposition to assess for post hospital community care coordination/management needs.  Referral request for community care coordination: pending disposition  Of note, [place] does not replace or interfere with any arrangements made by the Inpatient Transition of Care team.  For questions contact:   Natividad Brood, RN BSN [place]  [phone] business mobile phone Toll free office [phone]  *[place]  [phone] Fax number: [phone] [email or web address]    .

## 2022-11-15 NOTE — Progress Notes (Signed)
[facility] Cardiology    SUBJECTIVE: Patient states to be doing reasonably well no recent syncope no palpitations tachycardia no chest pain   Vitals:   11/14/22 1953 11/15/22 0009 11/15/22 0444 11/15/22 0859  BP: 94/60 112/71 120/74 109/71  Pulse: 82 (!) 105 86 (!) 46  Resp: '16 16 20 18  '$ Temp: 98.4 F (36.9 C) 98.2 F (36.8 C) 98.4 F (36.9 C) 98.4 F (36.9 C)  TempSrc: Oral Oral Oral   SpO2: 97% 97% 94% 97%  Weight:      Height:        No intake or output data in the 24 hours ending 11/15/22 1200    PHYSICAL EXAM  General: Well developed, well nourished, in no acute distress HEENT:  Normocephalic and atramatic Neck:  No JVD.  Lungs: Clear bilaterally to auscultation and percussion. Heart: Irregularly irregular 1 in the Morning. Normal S1 and S2 without gallops or murmurs.  Abdomen: Bowel sounds are positive, abdomen soft and non-tender  Msk:  Back normal, normal gait. Normal strength and tone for age. Extremities: No clubbing, cyanosis or edema.   Neuro: Alert and oriented X 3. Psych:  Good affect, responds appropriately   LABS: Basic Metabolic Panel: Recent Labs    11/14/22 0543 11/15/22 0455  NA 135 134*  K 4.5 4.1  CL 104 104  CO2 21* 23  GLUCOSE 79 106*  BUN 30* 37*  CREATININE 1.10* 1.34*  CALCIUM 8.7* 8.2*   Liver Function Tests: No results for input(s): "AST", "ALT", "ALKPHOS", "BILITOT", "PROT", "ALBUMIN" in the last 72 hours. No results for input(s): "LIPASE", "AMYLASE" in the last 72 hours. CBC: Recent Labs    11/13/22 0545 11/15/22 0455  WBC 8.8 7.1  HGB 9.8* 11.2*  HCT 30.4* 33.8*  MCV 95.3 91.6  PLT 160 277   Cardiac Enzymes: No results for input(s): "CKTOTAL", "CKMB", "CKMBINDEX", "TROPONINI" in the last 72 hours. BNP: Invalid input(s): "POCBNP" D-Dimer: No results for input(s): "DDIMER" in the last 72 hours. Hemoglobin A1C: No results for input(s): "HGBA1C" in the last 72 hours. Fasting Lipid Panel: No results for input(s):  "CHOL", "HDL", "LDLCALC", "TRIG", "CHOLHDL", "LDLDIRECT" in the last 72 hours. Thyroid Function Tests: No results for input(s): "TSH", "T4TOTAL", "T3FREE", "THYROIDAB" in the last 72 hours.  Invalid input(s): "FREET3" Anemia Panel: No results for input(s): "VITAMINB12", "FOLATE", "FERRITIN", "TIBC", "IRON", "RETICCTPCT" in the last 72 hours.  No results found.   Echo preserved overall left ventricular function  TELEMETRY: Atrial fibrillation rate of around 100 nonspecific ST-T changes:  ASSESSMENT AND PLAN:  Principal Problem:   Acute metabolic encephalopathy Active Problems:   Fibromyalgia syndrome   Generalized anxiety disorder   Chronic pain syndrome   Hypertension, benign   Syncope   Memory change   HLD (hyperlipidemia)   Paroxysmal atrial fibrillation with RVR (HCC)   SIRS (systemic inflammatory response syndrome) (HCC)   Stage 3b chronic kidney disease (CKD) (HCC)   UTI (urinary tract infection)   Encephalopathy    Plan Paroxysmal atrial fibrillation recommend Eliquis for anticoagulation with plan for rate discontinue digoxin Amiodarone therapy for antiarrhythmic control Recurrent syncope with falls recommend neurology evaluation Consider monitor for high-grade arrhythmias or heart block Ischemia workup negative with Myoview consider conservative medical therapy Chronic renal sufficiency stage III outpatient follow-up with nephrology Recommend skilled nursing facility for rehab   Yolonda Kida, MD 11/15/2022 12:00 PM

## 2022-11-15 NOTE — Progress Notes (Signed)
PT Cancellation Note  Patient Details Name: Donna Evans MRN: [id number] DOB: 1946-03-19   Cancelled Treatment:    Reason Eval/Treat Not Completed: Other (comment).  Chart reviewed.  Pt transferred to PCU since last seen for physical therapy (pt noted with persistent a-fib with RVR).  D/t pt transferring to higher level of care, per PT protocol require new PT consult in order to continue therapy--MD notified via secure messaging.  Will hold PT at this time until new PT order or continue at transfer PT order received.  Leitha Bleak, PT 11/15/22, 1:23 PM

## 2022-11-15 NOTE — Progress Notes (Signed)
Physical Therapy Treatment Patient Details Name: Donna Evans MRN: [id number] DOB: 04/16/1946 Today's Date: 11/15/2022   History of Present Illness 76 year old female with history of A-fib on Eliquis, hypertension, hyperlipidemia, insomnia brought to the emergency department with confusion and fall at home, complaining of back pain. Found to have chronic L2 compression fx (brace discontinued) and R 5th digit fx (in splint).  Also noted to have a-fib with RVR.    PT Comments    New PT order received (pt transferred to PCU d/t a-fib with RVR).  Pt resting in bed upon PT arrival; agreeable to therapy.  Pt's HR 108-128 bpm at rest and increased up to 155 bpm with ambulation.  During session pt SBA with bed mobility; CGA with transfers using RW; and min assist to ambulate 30 feet with RW (pt unsteady requiring assist for balance).  After walking 15 feet with RW, pt reporting feeling weak in general (HR noted to be in 140's to 155 bpm) so pt walked back to bed with assist to lay down (HR and symptoms improved with laying down in bed).  MD and pt's nurse notified regarding pt's symptoms and HR during session.  Will continue to focus on strengthening, balance, and progressive functional mobility during hospitalization.  POC reviewed and remains appropriate.   Recommendations for follow up therapy are one component of a multi-disciplinary discharge planning process, led by the attending physician.  Recommendations may be updated based on patient status, additional functional criteria and insurance authorization.  Follow Up Recommendations  Skilled nursing-short term rehab (<3 hours/day) Can patient physically be transported by private vehicle: Yes   Assistance Recommended at Discharge Frequent or constant Supervision/Assistance  Patient can return home with the following A little help with walking and/or transfers;A little help with bathing/dressing/bathroom;Assistance with cooking/housework;Help with  stairs or ramp for entrance;Assist for transportation   Equipment Recommendations  Rolling walker (2 wheels)    Recommendations for Other Services       Precautions / Restrictions Precautions Precautions: Fall Precaution Comments: Per MD back brace discontinued (orders also discontinued). Restrictions Other Position/Activity Restrictions: R 5th finger fx with splint     Mobility  Bed Mobility Overal bed mobility: Needs Assistance Bed Mobility: Supine to Sit, Sit to Supine     Supine to sit: Supervision, HOB elevated Sit to supine: Supervision, HOB elevated   General bed mobility comments: via logrolling    Transfers Overall transfer level: Needs assistance Equipment used: Rolling walker (2 wheels) Transfers: Sit to/from Stand Sit to Stand: Min guard           General transfer comment: vc's for walker use    Ambulation/Gait Ambulation/Gait assistance: Min assist Gait Distance (Feet): 30 Feet Assistive device: Rolling walker (2 wheels) Gait Pattern/deviations: Step-through pattern Gait velocity: decreased     General Gait Details: assist for balance   Stairs             Wheelchair Mobility    Modified Rankin (Stroke Patients Only)       Balance Overall balance assessment: Needs assistance Sitting-balance support: No upper extremity supported, Feet supported Sitting balance-Leahy Scale: Good Sitting balance - Comments: steady sitting reaching within BOS   Standing balance support: During functional activity, Bilateral upper extremity supported, Reliant on assistive device for balance Standing balance-Leahy Scale: Poor Standing balance comment: assist for balance walking with RW  Cognition Arousal/Alertness: Awake/alert Behavior During Therapy: WFL for tasks assessed/performed Overall Cognitive Status: Within Functional Limits for tasks assessed                                           Exercises      General Comments  Nursing cleared pt for participation in physical therapy.  Pt agreeable to PT session.      Pertinent Vitals/Pain Pain Assessment Pain Assessment: 0-10 Pain Score: 7  Pain Location: low back Pain Descriptors / Indicators: Aching, Sore Pain Intervention(s): Limited activity within patient's tolerance, Monitored during session, Repositioned, Premedicated before session    Home Living                          Prior Function            PT Goals (current goals can now be found in the care plan section) Acute Rehab PT Goals Patient Stated Goal: to improve mobility and balance PT Goal Formulation: With patient Time For Goal Achievement: 11/25/22 Potential to Achieve Goals: Good Progress towards PT goals: Progressing toward goals    Frequency    Min 2X/week      PT Plan Current plan remains appropriate    Co-evaluation              AM-PAC PT "6 Clicks" Mobility   Outcome Measure  Help needed turning from your back to your side while in a flat bed without using bedrails?: None Help needed moving from lying on your back to sitting on the side of a flat bed without using bedrails?: A Little Help needed moving to and from a bed to a chair (including a wheelchair)?: A Little Help needed standing up from a chair using your arms (e.g., wheelchair or bedside chair)?: A Little Help needed to walk in hospital room?: A Little Help needed climbing 3-5 steps with a railing? : A Little 6 Click Score: 19    End of Session Equipment Utilized During Treatment: Gait belt Activity Tolerance: Other (comment) (limited d/t weakness and elevated HR) Patient left: in bed;with call bell/phone within reach;with bed alarm set Nurse Communication: Mobility status;Precautions;Other (comment) (pt's elevated HR with activity) PT Visit Diagnosis: Other abnormalities of gait and mobility (R26.89);Muscle weakness (generalized) (M62.81);History of  falling (Z91.81);Repeated falls (R29.6);Pain Pain - part of body:  (low back)     Time: 2585-2778 PT Time Calculation (min) (ACUTE ONLY): 16 min  Charges:  $Therapeutic Activity: 8-22 mins                     Leitha Bleak, PT 11/15/22, 5:33 PM

## 2022-11-15 NOTE — Plan of Care (Signed)

## 2022-11-15 NOTE — TOC Progression Note (Addendum)
Transition of Care [facility]) - Progression Note    Patient Details  Name: NAKIEA METZNER MRN: [id number] Date of Birth: 1946-10-30  Transition of [facility]) CM/SW [place], [place] Phone Number: 11/15/2022, 2:27 PM  Clinical Narrative:     Update: SNF and EMS denied per [facility]. They request we re submit auth once patient is more stable and no longer being monitored for afib. Report they need a plan for afib/plan with cardiology, and updated PT notes.    CSW spoke with [facility] they report they are still pending auth for snf, under med review. Magda Paganini at [facility] updated on this and informed of potential dc tomorrow 12/5.  Expected Discharge Plan: [place] Barriers to Discharge: Continued Medical Work up  Expected Discharge Plan and Services Expected Discharge Plan: [place] Choice: [place] arrangements for the past 2 months: Single Family Home                           HH Arranged: RN, PT, OT, Nurse's Aide, Social Work [employer] Agency: [place] Date [place]: 11/11/22   Representative spoke with at [place]: [place] [employer]   Social Determinants of Health ([place]) Interventions    Readmission Risk Interventions     No data to display

## 2022-11-15 NOTE — Progress Notes (Signed)
PROGRESS NOTE  Donna Evans  BPZ:[id number] DOB: 10/13/1946 DOA: 11/09/2022 PCP: Jodi Marble, MD   Brief Narrative: Patient is a 76 year old female with history of paroxysmal A-fib on Eliquis, hypertension, hyperlipidemia, insomnia who was brought to the emergency department from home with complaints of confusion, fall, back pain.  On presentation, she was hemodynamically stable ,on room air.  She complained of dysuria.  Patient has history of multiple hospitalizations recently, last one being on November this year when she was admitted with the same.  She lives alone with caretaker support.  UTI was suspected on admission this time but urine culture showed  some multiple species.  PT/OT recommending SNF on discharge.  Hospital course remarkable for persistent A-fib with RVR, cardiology consulted .  Assessment & Plan:  Principal Problem:   Acute metabolic encephalopathy Active Problems:   SIRS (systemic inflammatory response syndrome) (HCC)   Syncope   Fibromyalgia syndrome   Generalized anxiety disorder   Chronic pain syndrome   Hypertension, benign   Memory change   HLD (hyperlipidemia)   Paroxysmal atrial fibrillation with RVR (HCC)   Stage 3b chronic kidney disease (CKD) (HCC)   UTI (urinary tract infection)   Encephalopathy  A-fib with RVR/Paroxysmal A-fib: Went into A-fib with RVR since the morning of 12/2.  Patient is asymptomatic.  Monitor on telemetry.  Patient is on digoxin, metoprolol.  On Eliquis for anticoagulation.Cardiology consulted.  Increase the dose of metoprolol to 3 times daily.  Rate is currently controlled.  Chest pain: Reproducible chest pain.  Tenderness on sternum on palpation.  This is most likely musculoskeletal pain.  Continue pain management, added ibuprofen, Protonix.  Lidocaine patch.  Troponins not significantly elevated, flat trend.Pain has improved. Cardiology recommended nuclear stress test.  Result pending  Acute metabolic encephalopathy:  Thought to be secondary to UTI, likely underlying dementia.   MRI of the brain  did not show any acute findings.  Normal vitamin B12, TSH level.  Monitor mental status, delirium precautions, avoid sedatives/narcotics as much as possible. This morning she is alert and oriented.  UTI: Complained of dysuria on presentation.  Urine culture showed multiple species.  She completed 3 days of IV antibiotics with ceftriaxone. We d/ced antibiotics.  Recurrent fall/L2 compression fracture: Compression fracture is chronic.  She lives alone.  She was given TSLO brace on last admission which is making her uncomfortable.  Her fracture is stable.  Brace discontinued.  Continue pain medications, muscle relaxants,supportive care. PT/OT recommending SNF placement.  TOC following.  Nephew also requesting for SNF  Stage III CKD: Currently kidney function at baseline.  Insomnia: Uses Ambien at night.  Try to avoid benzodiazepines as much as possible.  Chronic pain syndrome: Patient has chronic pain.  Currently on pain medications.  Continue supportive care.  Goals of care: Elderly patient with multiple admissions recently.  Palliative care consulted for goals of care. CODE STATUS is DNR       DVT prophylaxis:Place TED hose Start: 11/09/22 1801 apixaban (ELIQUIS) tablet 5 mg     Code Status: DNR  Family Communication: Duane Boston on phone on 12/1.  Friend at bedside today  Patient status:Inpatient  Patient is from :Home  Anticipated discharge to: SNF  Estimated DC date: after cardiology consult, availability of bed   Consultants: Cardiology  Procedures:Stress test  Antimicrobials:  Anti-infectives (From admission, onward)    Start     Dose/Rate Route Frequency Ordered Stop   11/09/22 1930  cefTRIAXone (ROCEPHIN) 2 g in sodium chloride  0.9 % 100 mL IVPB  Status:  Discontinued        2 g 200 mL/hr over 30 Minutes Intravenous Every 24 hours 11/09/22 1850 11/12/22 3474        Subjective:  Patient seen and examined the bedside this afternoon.  Status From nuclear stress test.  She was comfortably lying on the bed.  Heart rate in the range of 100s.  Denies any chest pain.  complaints of back pain.  Objective: Vitals:   11/14/22 1953 11/15/22 0009 11/15/22 0444 11/15/22 0859  BP: 94/60 112/71 120/74 109/71  Pulse: 82 (!) 105 86 (!) 46  Resp: '16 16 20 18  '$ Temp: 98.4 F (36.9 C) 98.2 F (36.8 C) 98.4 F (36.9 C) 98.4 F (36.9 C)  TempSrc: Oral Oral Oral   SpO2: 97% 97% 94% 97%  Weight:      Height:       No intake or output data in the 24 hours ending 11/15/22 1339   Filed Weights   11/09/22 1427  Weight: 50.8 kg    Examination:   General exam: Overall comfortable, not in distress HEENT: PERRL Respiratory system:  no wheezes or crackles  Cardiovascular system: Irregularly irregular rhythm.  Gastrointestinal system: Abdomen is nondistended, soft and nontender. Central nervous system: Alert and oriented Extremities: No edema, no clubbing ,no cyanosis Skin: No rashes, no ulcers,no icterus     Data Reviewed: I have personally reviewed following labs and imaging studies  CBC: Recent Labs  Lab 11/09/22 1447 11/10/22 0531 11/12/22 0810 11/13/22 0545 11/15/22 0455  WBC 11.8* 7.2 10.9* 8.8 7.1  HGB 13.7 10.6* 10.8* 9.8* 11.2*  HCT 41.3 33.0* 31.6* 30.4* 33.8*  MCV 92.0 94.6 90.8 95.3 91.6  PLT 210 176 173 160 259   Basic Metabolic Panel: Recent Labs  Lab 11/09/22 1947 11/10/22 0531 11/12/22 0810 11/13/22 0545 11/14/22 0543 11/15/22 0455  NA  --  144 139 139 135 134*  K  --  3.1* 4.3 4.7 4.5 4.1  CL  --  114* 108 109 104 104  CO2  --  21* 23 23 21* 23  GLUCOSE  --  106* 121* 112* 79 106*  BUN  --  31* 25* 32* 30* 37*  CREATININE  --  1.09* 1.13* 1.31* 1.10* 1.34*  CALCIUM  --  8.6* 8.4* 8.4* 8.7* 8.2*  MG 2.0  --   --   --   --   --   PHOS 2.9  --   --   --   --   --      Recent Results (from the past 240 hour(s))   Blood culture (routine x 2)     Status: None   Collection Time: 11/09/22  2:48 PM   Specimen: BLOOD  Result Value Ref Range Status   Specimen Description BLOOD RIGHT ANTECUBITAL  Final   Special Requests   Final    BOTTLES DRAWN AEROBIC AND ANAEROBIC Blood Culture results may not be optimal due to an inadequate volume of blood received in culture bottles   Culture   Final    NO GROWTH 5 DAYS Performed at [facility], [address] [place], [place] [address]    Report Status 11/14/2022 FINAL  Final  Resp Panel by RT-PCR (Flu A&B, Covid) Anterior Nasal Swab     Status: None   Collection Time: 11/09/22  5:44 PM   Specimen: Anterior Nasal Swab  Result Value Ref Range Status   SARS Coronavirus 2  by RT PCR NEGATIVE NEGATIVE Final    Comment: (NOTE) SARS-CoV-2 target nucleic acids are NOT DETECTED.  The SARS-CoV-2 RNA is generally detectable in upper respiratory specimens during the acute phase of infection. The lowest concentration of SARS-CoV-2 viral copies this assay can detect is 138 copies/mL. A negative result does not preclude SARS-Cov-2 infection and should not be used as the sole basis for treatment or other patient management decisions. A negative result may occur with  improper specimen collection/handling, submission of specimen other than nasopharyngeal swab, presence of viral mutation(s) within the areas targeted by this assay, and inadequate number of viral copies(<138 copies/mL). A negative result must be combined with clinical observations, patient history, and epidemiological information. The expected result is Negative.  Fact Sheet for Patients:  EntrepreneurPulse.com.au  Fact Sheet for Healthcare Providers:  IncredibleEmployment.be  This test is no t yet approved or cleared by the [place] FDA and  has been authorized for detection and/or diagnosis of SARS-CoV-2 by FDA under an Emergency Use Authorization  (EUA). This EUA will remain  in effect (meaning this test can be used) for the duration of the COVID-19 declaration under Section 564(b)(1) of the Act, 21 U.S.C.section 360bbb-3(b)(1), unless the authorization is terminated  or revoked sooner.       Influenza A by PCR NEGATIVE NEGATIVE Final   Influenza B by PCR NEGATIVE NEGATIVE Final    Comment: (NOTE) The Xpert Xpress SARS-CoV-2/FLU/RSV plus assay is intended as an aid in the diagnosis of influenza from Nasopharyngeal swab specimens and should not be used as a sole basis for treatment. Nasal washings and aspirates are unacceptable for Xpert Xpress SARS-CoV-2/FLU/RSV testing.  Fact Sheet for Patients: EntrepreneurPulse.com.au  Fact Sheet for Healthcare Providers: IncredibleEmployment.be  This test is not yet approved or cleared by the [place] FDA and has been authorized for detection and/or diagnosis of SARS-CoV-2 by FDA under an Emergency Use Authorization (EUA). This EUA will remain in effect (meaning this test can be used) for the duration of the COVID-19 declaration under Section 564(b)(1) of the Act, 21 U.S.C. section 360bbb-3(b)(1), unless the authorization is terminated or revoked.  Performed at [facility], [address] [place], [place] [address]   Urine Culture     Status: Abnormal   Collection Time: 11/09/22  5:50 PM   Specimen: Urine, Random  Result Value Ref Range Status   Specimen Description   Final    URINE, RANDOM Performed at [facility], [address] [place], [place] [address]    Special Requests   Final    NONE Performed at [facility], [address] [place], [place] [address]    Culture MULTIPLE SPECIES PRESENT, SUGGEST RECOLLECTION (A)  Final   Report Status 11/11/2022 FINAL  Final  Blood culture (routine x 2)     Status: None   Collection Time: 11/10/22  1:07 AM   Specimen: BLOOD  Result Value Ref Range Status    Specimen Description BLOOD RIGHT HAND  Final   Special Requests   Final    BOTTLES DRAWN AEROBIC AND ANAEROBIC Blood Culture adequate volume   Culture   Final    NO GROWTH 5 DAYS Performed at [facility], [address] [place], [place] [address]    Report Status 11/15/2022 FINAL  Final     Radiology Studies: No results found.  Scheduled Meds:  apixaban  5 mg Oral BID   digoxin  125 mcg Oral Daily   feeding supplement  237 mL Oral BID BM   folic acid  1 mg Oral Daily   lidocaine  1 patch Transdermal Q24H   metoprolol tartrate  50 mg Oral TID   midodrine  2.5 mg Oral TID WC   multivitamin with minerals  1 tablet Oral Daily   pantoprazole  40 mg Oral Daily   rosuvastatin  10 mg Oral Daily   Vitamin D (Ergocalciferol)  50,000 Units Oral Q Sun   Continuous Infusions:     LOS: 5 days   Shelly Coss, MD [facility] P12/03/2022, 1:39 PM

## 2022-11-16 DIAGNOSIS — Z7189 Other specified counseling: Secondary | ICD-10-CM | POA: Diagnosis not present

## 2022-11-16 DIAGNOSIS — G9341 Metabolic encephalopathy: Secondary | ICD-10-CM | POA: Diagnosis not present

## 2022-11-16 NOTE — Progress Notes (Signed)
PROGRESS NOTE  Donna Evans  ZOX:[id number] DOB: 08/09/1946 DOA: 11/09/2022 PCP: Jodi Marble, MD   Brief Narrative: Patient is a 76 year old female with history of paroxysmal A-fib on Eliquis, hypertension, hyperlipidemia, insomnia who was brought to the emergency department from home with complaints of confusion, fall, back pain.  On presentation, she was hemodynamically stable ,on room air.  She complained of dysuria.  Patient has history of multiple hospitalizations recently, last one being on November this year when she was admitted with the same.  She lives alone with caretaker support.  UTI was suspected on admission this time but urine culture showed  some multiple species.  PT/OT recommending SNF on discharge.  Hospital course remarkable for persistent A-fib with RVR, cardiology consulted .Now rate is controlled.  Waiting for SNF  Assessment & Plan:  Principal Problem:   Acute metabolic encephalopathy Active Problems:   SIRS (systemic inflammatory response syndrome) (HCC)   Syncope   Fibromyalgia syndrome   Generalized anxiety disorder   Chronic pain syndrome   Hypertension, benign   Memory change   HLD (hyperlipidemia)   Paroxysmal atrial fibrillation with RVR (HCC)   Stage 3b chronic kidney disease (CKD) (HCC)   UTI (urinary tract infection)   Encephalopathy  A-fib with RVR/Paroxysmal A-fib: Went into A-fib with RVR in  the morning of 12/2.  On Eliquis for anticoagulation.Cardiology consulted.  Increase the dose of metoprolol to 3 times daily.  Rate is currently controlled.  Digoxin discontinued.  Started on amiodarone.  Chest pain: Reproducible chest pain.  Tenderness on sternum on palpation.  This is most likely musculoskeletal pain.  Continue pain management, added ibuprofen, Protonix.  Lidocaine patch.  Troponins not significantly elevated, flat trend.Pain has improved. Negative nuclear stress test as per cardiology note on 12/4 but official test result  is not in  the computer.    Acute metabolic encephalopathy: Thought to be secondary to UTI, likely underlying dementia.   MRI of the brain  did not show any acute findings.  Normal vitamin B12, TSH level.  Monitor mental status, delirium precautions, avoid sedatives/narcotics as much as possible. This morning she is alert and oriented.  UTI: Complained of dysuria on presentation.  Urine culture showed multiple species.  She completed 3 days of IV antibiotics with ceftriaxone. We d/ced antibiotics.  Recurrent fall/L2 compression fracture: Compression fracture is chronic.  She lives alone.  She was given TSLO brace on last admission which is making her uncomfortable.  Her fracture is stable.  Brace discontinued.  Continue pain medications, muscle relaxants,supportive care. She also has of acute/subacute fracture of the right small finger.  Case discussed with Dr. Harlow Mares, recommended to use ulnar gutter splint, conservative management PT/OT recommending SNF placement.  TOC following.  Nephew also requesting for SNF  Stage III CKD: Currently kidney function at baseline.  Insomnia: Uses Ambien at night.  Try to avoid benzodiazepines as much as possible.  Chronic pain syndrome: Patient has chronic pain.  Currently on pain medications.  Continue supportive care.  Goals of care: Elderly patient with multiple admissions recently.  Palliative care consulted for goals of care. CODE STATUS is DNR       DVT prophylaxis:Place TED hose Start: 11/09/22 1801 apixaban (ELIQUIS) tablet 5 mg     Code Status: DNR  Family Communication: Duane Boston on phone on 12/1.  Friend at bedside on 12/4  Patient status:Inpatient  Patient is from :Home  Anticipated discharge to: SNF  Estimated DC date: as soon as bed  is available   Consultants: Cardiology  Procedures:Stress test  Antimicrobials:  Anti-infectives (From admission, onward)    Start     Dose/Rate Route Frequency Ordered Stop   11/09/22 1930   cefTRIAXone (ROCEPHIN) 2 g in sodium chloride 0.9 % 100 mL IVPB  Status:  Discontinued        2 g 200 mL/hr over 30 Minutes Intravenous Every 24 hours 11/09/22 1850 11/12/22 6659       Subjective:  Patient seen and examined at the bedside today.  Hemodynamically stable.  Rate is well controlled today.  She denies any chest pain.  Her back pain is better today.No new complians.  Objective: Vitals:   11/15/22 2339 11/16/22 0354 11/16/22 0755 11/16/22 1238  BP: (!) 108/53 104/65 (!) 141/68 114/81  Pulse: 84 79 95 (!) 114  Resp: '20 20 16 16  '$ Temp: 98.1 F (36.7 C) 98.2 F (36.8 C) 98.1 F (36.7 C) 97.6 F (36.4 C)  TempSrc: Oral Oral  Oral  SpO2: 97% 96% 96% 96%  Weight:      Height:       No intake or output data in the 24 hours ending 11/16/22 1340   Filed Weights   11/09/22 1427  Weight: 50.8 kg    Examination:   General exam: Overall comfortable, not in distress HEENT: PERRL Respiratory system:  no wheezes or crackles  Cardiovascular system: Irregularly irregular rhythm Gastrointestinal system: Abdomen is nondistended, soft and nontender. Central nervous system: Alert and oriented Extremities: No edema, no clubbing ,no cyanosis, dressing on the right small finger Skin: No rashes, no ulcers,no icterus     Data Reviewed: I have personally reviewed following labs and imaging studies  CBC: Recent Labs  Lab 11/09/22 1447 11/10/22 0531 11/12/22 0810 11/13/22 0545 11/15/22 0455  WBC 11.8* 7.2 10.9* 8.8 7.1  HGB 13.7 10.6* 10.8* 9.8* 11.2*  HCT 41.3 33.0* 31.6* 30.4* 33.8*  MCV 92.0 94.6 90.8 95.3 91.6  PLT 210 176 173 160 935   Basic Metabolic Panel: Recent Labs  Lab 11/09/22 1947 11/10/22 0531 11/12/22 0810 11/13/22 0545 11/14/22 0543 11/15/22 0455  NA  --  144 139 139 135 134*  K  --  3.1* 4.3 4.7 4.5 4.1  CL  --  114* 108 109 104 104  CO2  --  21* 23 23 21* 23  GLUCOSE  --  106* 121* 112* 79 106*  BUN  --  31* 25* 32* 30* 37*  CREATININE  --   1.09* 1.13* 1.31* 1.10* 1.34*  CALCIUM  --  8.6* 8.4* 8.4* 8.7* 8.2*  MG 2.0  --   --   --   --   --   PHOS 2.9  --   --   --   --   --      Recent Results (from the past 240 hour(s))  Blood culture (routine x 2)     Status: None   Collection Time: 11/09/22  2:48 PM   Specimen: BLOOD  Result Value Ref Range Status   Specimen Description BLOOD RIGHT ANTECUBITAL  Final   Special Requests   Final    BOTTLES DRAWN AEROBIC AND ANAEROBIC Blood Culture results may not be optimal due to an inadequate volume of blood received in culture bottles   Culture   Final    NO GROWTH 5 DAYS Performed at [facility], [address], [place], [place] [address]    Report Status 11/14/2022 FINAL  Final  Resp Panel  by RT-PCR (Flu A&B, Covid) Anterior Nasal Swab     Status: None   Collection Time: 11/09/22  5:44 PM   Specimen: Anterior Nasal Swab  Result Value Ref Range Status   SARS Coronavirus 2 by RT PCR NEGATIVE NEGATIVE Final    Comment: (NOTE) SARS-CoV-2 target nucleic acids are NOT DETECTED.  The SARS-CoV-2 RNA is generally detectable in upper respiratory specimens during the acute phase of infection. The lowest concentration of SARS-CoV-2 viral copies this assay can detect is 138 copies/mL. A negative result does not preclude SARS-Cov-2 infection and should not be used as the sole basis for treatment or other patient management decisions. A negative result may occur with  improper specimen collection/handling, submission of specimen other than nasopharyngeal swab, presence of viral mutation(s) within the areas targeted by this assay, and inadequate number of viral copies(<138 copies/mL). A negative result must be combined with clinical observations, patient history, and epidemiological information. The expected result is Negative.  Fact Sheet for Patients:  EntrepreneurPulse.com.au  Fact Sheet for Healthcare Providers:   IncredibleEmployment.be  This test is no t yet approved or cleared by the [place] FDA and  has been authorized for detection and/or diagnosis of SARS-CoV-2 by FDA under an Emergency Use Authorization (EUA). This EUA will remain  in effect (meaning this test can be used) for the duration of the COVID-19 declaration under Section 564(b)(1) of the Act, 21 U.S.C.section 360bbb-3(b)(1), unless the authorization is terminated  or revoked sooner.       Influenza A by PCR NEGATIVE NEGATIVE Final   Influenza B by PCR NEGATIVE NEGATIVE Final    Comment: (NOTE) The Xpert Xpress SARS-CoV-2/FLU/RSV plus assay is intended as an aid in the diagnosis of influenza from Nasopharyngeal swab specimens and should not be used as a sole basis for treatment. Nasal washings and aspirates are unacceptable for Xpert Xpress SARS-CoV-2/FLU/RSV testing.  Fact Sheet for Patients: EntrepreneurPulse.com.au  Fact Sheet for Healthcare Providers: IncredibleEmployment.be  This test is not yet approved or cleared by the [place] FDA and has been authorized for detection and/or diagnosis of SARS-CoV-2 by FDA under an Emergency Use Authorization (EUA). This EUA will remain in effect (meaning this test can be used) for the duration of the COVID-19 declaration under Section 564(b)(1) of the Act, 21 U.S.C. section 360bbb-3(b)(1), unless the authorization is terminated or revoked.  Performed at [facility], [address], [place], [place] [address]   Urine Culture     Status: Abnormal   Collection Time: 11/09/22  5:50 PM   Specimen: Urine, Random  Result Value Ref Range Status   Specimen Description   Final    URINE, RANDOM Performed at [facility], [address], [place], [place] [address]    Special Requests   Final    NONE Performed at [facility], [address] [place], [place] [address]    Culture  MULTIPLE SPECIES PRESENT, SUGGEST RECOLLECTION (A)  Final   Report Status 11/11/2022 FINAL  Final  Blood culture (routine x 2)     Status: None   Collection Time: 11/10/22  1:07 AM   Specimen: BLOOD  Result Value Ref Range Status   Specimen Description BLOOD RIGHT HAND  Final   Special Requests   Final    BOTTLES DRAWN AEROBIC AND ANAEROBIC Blood Culture adequate volume   Culture   Final    NO GROWTH 5 DAYS Performed at [facility], [address], [place], [place] [address]  Report Status 11/15/2022 FINAL  Final     Radiology Studies: No results found.  Scheduled Meds:  amiodarone  200 mg Oral BID   apixaban  5 mg Oral BID   feeding supplement  237 mL Oral BID BM   folic acid  1 mg Oral Daily   lidocaine  1 patch Transdermal Q24H   metoprolol tartrate  50 mg Oral TID   midodrine  2.5 mg Oral TID WC   multivitamin with minerals  1 tablet Oral Daily   pantoprazole  40 mg Oral Daily   rosuvastatin  10 mg Oral Daily   Vitamin D (Ergocalciferol)  50,000 Units Oral Q Sun   Continuous Infusions:     LOS: 6 days   Shelly Coss, MD [facility] P12/04/2022, 1:40 PM

## 2022-11-16 NOTE — Progress Notes (Signed)
Per chart review, CSW made a call to [facility] to resubmit auth for patient due to patient no longer being monitored for Afib per MD.  CSW lvm for Tammy at [facility] ([phone]).  Waiting to hear back to restart the auth for SNF.  [place], [place]

## 2022-11-16 NOTE — Progress Notes (Signed)
Daily Progress Note   Patient Name: Donna Evans       Date: 11/16/2022 DOB: 05/30/1946  Age: 76 y.o. MRN#: [id number] Attending Physician: Shelly Coss, MD Primary Care Physician: Jodi Marble, MD Admit Date: 11/09/2022  Reason for Consultation/Follow-up: Establishing goals of care  Subjective: Notes and labs reviewed. In to see patient. She is resting in bed. She discusses gratitude for cardiology visit. She discusses pain with mobility and pain in the right hand with trying to push off to get out of bed.   She is amenable to SNF. Continue current care.   PMT will shadow for needs.   Length of Stay: 6  Current Medications: Scheduled Meds:   amiodarone  200 mg Oral BID   apixaban  5 mg Oral BID   digoxin  0.0625 mg Oral Daily   feeding supplement  237 mL Oral BID BM   folic acid  1 mg Oral Daily   lidocaine  1 patch Transdermal Q24H   metoprolol tartrate  50 mg Oral TID   midodrine  2.5 mg Oral TID WC   multivitamin with minerals  1 tablet Oral Daily   pantoprazole  40 mg Oral Daily   rosuvastatin  10 mg Oral Daily   Vitamin D (Ergocalciferol)  50,000 Units Oral Q Sun    Continuous Infusions:   PRN Meds: alum & mag hydroxide-simeth, cyclobenzaprine, HYDROcodone-acetaminophen, ibuprofen, loperamide, nicotine, ondansetron (ZOFRAN) IV, zolpidem  Physical Exam Pulmonary:     Effort: Pulmonary effort is normal.  Neurological:     Mental Status: She is alert.             Vital Signs: BP (!) 141/68 (BP Location: Right Arm)   Pulse 95   Temp 98.1 F (36.7 C)   Resp 16   Ht '5\' 7"'$  (1.702 m)   Wt 50.8 kg   SpO2 96%   BMI 17.54 kg/m  SpO2: SpO2: 96 % O2 Device: O2 Device: Room Air O2 Flow Rate:    Intake/output summary: No intake or output data in the 24  hours ending 11/16/22 1041 LBM: Last BM Date : 11/13/22 Baseline Weight: Weight: 50.8 kg Most recent weight: Weight: 50.8 kg        Patient Active Problem List   Diagnosis Date Noted   Encephalopathy 11/10/2022  SIRS (systemic inflammatory response syndrome) ([place]) 11/09/2022   Stage 3b chronic kidney disease (CKD) ([place]) 11/09/2022   UTI (urinary tract infection) 11/09/2022   Chronic kidney disease, stage 3a ([place]) 73/41/9379   Metabolic acidosis 02/40/9735   Steatohepatitis, non-alcoholic 32/99/2426   Paroxysmal atrial fibrillation with RVR ([place]) 10/14/2022   Delirium due to another medical condition 10/08/2022   Bilateral leg edema    Community acquired pneumonia of left lower lobe of lung    Head injury    Ground-level fall 10/06/2022   Acute encephalopathy 10/06/2022   Lymphedema 10/06/2022   Elevated LFTs 10/06/2022   HLD (hyperlipidemia) 08/10/2022   Elevated alkaline phosphatase level 08/10/2022   Elevated troponin 04/28/2022   Stroke ([place]) 04/28/2022   Fall 04/28/2022   Acute renal failure superimposed on stage 3a chronic kidney disease ([place]) 04/28/2022   Chronic diastolic CHF (congestive heart failure) ([place]) 83/41/9622   Acute metabolic encephalopathy 29/79/8921   Venous insufficiency 03/31/2022   SOB (shortness of breath) on exertion 03/08/2022   Pedal edema 03/08/2022   Closed fracture of distal end of left radius 12/22/2021   Low back pain 12/17/2021   Fracture of pelvis ([place]) 11/13/2021   Bilateral leg weakness 05/13/2021   Memory change 03/04/2021   Imbalance 03/04/2021   Falls frequently 03/04/2021   Dizziness 03/04/2021   Senile osteoporosis 10/18/2019   Long term current use of opiate analgesic 07/04/2017   Long term prescription opiate use 07/04/2017   Opiate use 07/04/2017   Bursitis of shoulder 07/04/2017   Closed fracture of upper end of humerus 07/04/2017   Knee pain 07/04/2017   Localized, primary osteoarthritis 07/04/2017   Osteoarthritis of  knee 07/04/2017   Pain in limb 07/04/2017   Sprain of wrist 07/04/2017   Pain in joint involving ankle and foot 07/04/2017   Impingement syndrome of left shoulder region 06/29/2017   Trochanteric bursitis 06/29/2017   AKI (acute kidney injury) ([place]) 10/01/2016   Syncope 09/30/2016   Migraine headache 08/10/2016   Controlled substance agreement signed 06/12/2016   Insomnia 06/12/2016   Hypertension, benign 02/04/2016   Easy bruisability 11/13/2015   Osteoporosis 11/13/2015   Fibromyalgia syndrome 11/13/2015   Generalized anxiety disorder 11/13/2015   Chronic pain syndrome 11/13/2015   Overactive detrusor 09/11/2015   Adrenal adenoma 09/02/2015   Chronic cystitis 09/02/2015   Incomplete bladder emptying 19/41/7408   Renal colic 14/48/1856   Urinary tract infection 07/28/2015   Gross hematuria 07/28/2015   History of reconstruction of both breasts 06/18/2015   S/P cardiac catheterization 06/18/2015   Acquired absence of both breasts and nipples 11/20/2014   Transient cerebral ischemia 11/28/2013   Hemiplegia of dominant side ([place]) 11/20/2013    Palliative Care Assessment & Plan    Recommendations/Plan:  Patient is amenable to SNF. Continue current care.   PMT will shadow for needs.      Code Status:    Code Status Orders  (From admission, onward)           Start     Ordered   11/09/22 1803  Do not attempt resuscitation (DNR)  Continuous       Question Answer Comment  In the event of cardiac or respiratory ARREST Do not call a "code blue"   In the event of cardiac or respiratory ARREST Do not perform Intubation, CPR, defibrillation or ACLS   In the event of cardiac or respiratory ARREST Use medication by any route, position, wound care, and other measures to relive pain and suffering.  May use oxygen, suction and manual treatment of airway obstruction as needed for comfort.      11/09/22 1802           Code Status History     Date Active Date Inactive  Code Status Order ID Comments User Context   11/09/2022 1802 11/09/2022 1802 Full Code [id number]  CoxBriant Cedar, DO ED   11/02/2022 0205 11/02/2022 1747 DNR [id number]  Caren Griffins, MD ED   10/06/2022 1713 10/18/2022 2045 DNR [id number]  Jose Persia, MD ED   08/10/2022 0003 08/13/2022 2027 Full Code [id number]  Clance Boll, MD ED   04/28/2022 1950 05/04/2022 1846 DNR [id number]  Ivor Costa, MD ED   04/28/2022 1511 04/28/2022 1950 Full Code [id number]  Ivor Costa, MD ED   12/31/2019 1744 12/31/2019 2327 Full Code [id number]  Lovell Sheehan, MD Inpatient   10/01/2016 0208 10/01/2016 2103 DNR [id number]  Karmen Bongo, MD Inpatient   08/29/2015 1745 08/31/2015 1326 Full Code [id number]  Vaughan Basta, MD Inpatient       Care plan was discussed with primary MD  Thank you for allowing the Palliative Medicine Team to assist in the care of this patient.    Asencion Gowda, NP  Please contact Palliative Medicine Team phone at [phone] for questions and concerns.

## 2022-11-16 NOTE — Plan of Care (Signed)

## 2022-11-16 NOTE — Progress Notes (Signed)
OT Cancellation Note  Patient Details Name: Donna Evans MRN: [id number] DOB: May 19, 1946   Cancelled Treatment:    Reason Eval/Treat Not Completed: Pain limiting ability to participate. Pt declining to work with therapy at this time due to 10/10 back pain. RN notified pt requesting pain meds. Will re-attempt as able.   Doneta Public 11/16/2022, 3:39 PM

## 2022-11-16 NOTE — Progress Notes (Signed)
Physical Therapy Treatment Patient Details Name: Donna Evans MRN: [id number] DOB: 06/22/1946 Today's Date: 11/16/2022   History of Present Illness 76 year old female with history of A-fib on Eliquis, hypertension, hyperlipidemia, insomnia brought to the emergency department with confusion and fall at home, complaining of back pain. Found to have chronic L2 compression fx (brace discontinued) and R 5th digit fx (in splint).  Also noted to have a-fib with RVR and orthostatic hypotension.    PT Comments    Pain in back at 7/10, but pt agreeable to session. RW helps with pain control in gait, but gait is limited by orthostatic BP drops and associated rate increases due to AF and RVR. Unfortunately most of prior midodrine dose likely to have worn off by time of author's arrival to room. Pt asks for help with linen/gown change due to purewick malfunction. Pt left up in chair at end of session, RN aware of bed change/purewick/pericare needs.   Recommendations for follow up therapy are one component of a multi-disciplinary discharge planning process, led by the attending physician.  Recommendations may be updated based on patient status, additional functional criteria and insurance authorization.  Follow Up Recommendations  Skilled nursing-short term rehab (<3 hours/day) Can patient physically be transported by private vehicle: Yes   Assistance Recommended at Discharge Frequent or constant Supervision/Assistance  Patient can return home with the following A little help with walking and/or transfers;A little help with bathing/dressing/bathroom;Assistance with cooking/housework;Help with stairs or ramp for entrance;Assist for transportation   Equipment Recommendations  Rolling walker (2 wheels)    Recommendations for Other Services       Precautions / Restrictions Precautions Precautions: Fall Precaution Comments: Per MD back brace discontinued (orders also discontinued). Restrictions Weight  Bearing Restrictions: No Other Position/Activity Restrictions: R 5th finger fx with splint     Mobility  Bed Mobility Overal bed mobility: Needs Assistance Bed Mobility: Supine to Sit                Transfers Overall transfer level: Needs assistance Equipment used: Rolling walker (2 wheels) Transfers: Sit to/from Stand Sit to Stand: Supervision                Ambulation/Gait Ambulation/Gait assistance: Min guard Gait Distance (Feet): 45 Feet Assistive device: Rolling walker (2 wheels) Gait Pattern/deviations: WFL(Within Functional Limits), Step-through pattern       General Gait Details: felt dizzy again by the time she reaches the door; subsequently (+) orthostatic hypotension. Pt is off-cyle for medication which was administered mid session.   Stairs             Wheelchair Mobility    Modified Rankin (Stroke Patients Only)       Balance                                            Cognition Arousal/Alertness: Awake/alert Behavior During Therapy: WFL for tasks assessed/performed Overall Cognitive Status: Within Functional Limits for tasks assessed                                          Exercises Other Exercises Other Exercises: STS for gown change, STS for AMB to door, STS for OH assessment, SPT chair to BSC, SPT BSC to recliner    General Comments  Pertinent Vitals/Pain Pain Assessment Pain Assessment: 0-10 Pain Score: 7  Pain Location: across low back, s/p compression fracture Pain Intervention(s): Limited activity within patient's tolerance, Monitored during session, Premedicated before session, Repositioned    Home Living                          Prior Function            PT Goals (current goals can now be found in the care plan section) Acute Rehab PT Goals Patient Stated Goal: to improve mobility and balance PT Goal Formulation: With patient Time For Goal Achievement:  11/25/22 Potential to Achieve Goals: Fair Progress towards PT goals: Not progressing toward goals - comment    Frequency    Min 2X/week      PT Plan Current plan remains appropriate    Co-evaluation              AM-PAC PT "6 Clicks" Mobility   Outcome Measure  Help needed turning from your back to your side while in a flat bed without using bedrails?: None Help needed moving from lying on your back to sitting on the side of a flat bed without using bedrails?: None Help needed moving to and from a bed to a chair (including a wheelchair)?: A Little Help needed standing up from a chair using your arms (e.g., wheelchair or bedside chair)?: A Little Help needed to walk in hospital room?: A Little Help needed climbing 3-5 steps with a railing? : A Lot 6 Click Score: 19    End of Session Equipment Utilized During Treatment: Gait belt Activity Tolerance: Patient tolerated treatment well;Treatment limited secondary to medical complications (Comment);Patient limited by pain (orthostatic which is triggering worse RVR due to being in AF) Patient left: in chair;with call bell/phone within reach;with chair alarm set Nurse Communication: Mobility status;Precautions;Other (comment) PT Visit Diagnosis: Other abnormalities of gait and mobility (R26.89);Muscle weakness (generalized) (M62.81);History of falling (Z91.81);Repeated falls (R29.6);Pain     Time: 6644-0347 PT Time Calculation (min) (ACUTE ONLY): 29 min  Charges:  $Therapeutic Activity: 23-37 mins                    12:40 PM, 11/16/22 Donna Evans, PT, DPT Physical Therapist - [facility]  [phone] ([place])    Donna Evans C 11/16/2022, 12:37 PM

## 2022-11-16 NOTE — Consult Note (Signed)
ORTHOPAEDIC CONSULTATION  REQUESTING PHYSICIAN: Shelly Coss, MD  Chief Complaint: right little finger pain  HPI: Donna Evans is a 76 y.o. female who complains of right small finger pain after fall. The pain is sharp in character. The pain is severe and 3/10. The pain is worse with movement and better with rest. Denies any numbness, tingling or constitutional symptoms.  Past Medical History:  Diagnosis Date   Allergic state    Anginal pain ([place])    Prinzmetal's angina   Anxiety    Arthritis    osteoarthritis   Breast cancer ([place]) 1990   right breast cancer   Chronic pain    Chronic pain    COPD (chronic obstructive pulmonary disease) ([facility])    Depression    Edema    Fibromyalgia    Foot fracture    Bilateral   GERD (gastroesophageal reflux disease)    History of kidney stones    Hypertension    Leg fracture, right    Low back pain    Lumbosacral neuritis    Medulloadrenal hyperfunction (H[facility])    Migraine headache    Peripheral neuropathy    Shoulder fracture, right    Stroke [facility])    TIA   Systemic lupus erythematosus ([place])    Thyroid disease    TIA (transient ischemic attack) 11/28/2013   Transient global amnesia 2011   Past Surgical History:  Procedure Laterality Date   ABDOMINAL HYSTERECTOMY  1981   APPENDECTOMY  1957   AUGMENTATION MAMMAPLASTY Bilateral 1990   saline submuscular   BACK SURGERY  1989   BILATERAL TOTAL MASTECTOMY WITH AXILLARY LYMPH NODE DISSECTION  1990   BREAST IMPLANT EXCHANGE Bilateral 05/11/2016   Procedure: REMOVAL AND REPLACEMENT OF BREAST IMPLANTS ;  Surgeon: Irene Limbo, MD;  Location: [facility];  Service: Plastics;  Laterality: Bilateral;   CAPSULECTOMY Bilateral 05/11/2016   Procedure: CAPSULECTOMY CAPSULORRAPHY ;  Surgeon: Irene Limbo, MD;  Location: [facility];  Service: Plastics;  Laterality: Bilateral;   CATARACT EXTRACTION W/PHACO Right 12/26/2018   Procedure: CATARACT EXTRACTION  PHACO AND INTRAOCULAR LENS PLACEMENT ([place]) RIGHT;  Surgeon: Birder Robson, MD;  Location: [facility];  Service: Ophthalmology;  Laterality: Right;  [place]  00:46 CDE 8.50 Fluid pack lot # [id number]   CATARACT EXTRACTION W/PHACO Left 01/23/2019   Procedure: CATARACT EXTRACTION PHACO AND INTRAOCULAR LENS PLACEMENT ([place]) LEFT;  Surgeon: Birder Robson, MD;  Location: [facility];  Service: Ophthalmology;  Laterality: Left;  [place]  00:45 CDE  8.41 fluid pack lot # [id number]   CHOLECYSTECTOMY  1979   COLONOSCOPY WITH PROPOFOL N/A 01/25/2018   Procedure: COLONOSCOPY WITH PROPOFOL;  Surgeon: Manya Silvas, MD;  Location: [facility];  Service: Endoscopy;  Laterality: N/A;   COPD     ESOPHAGOGASTRODUODENOSCOPY (EGD) WITH PROPOFOL N/A 03/07/2018   Procedure: ESOPHAGOGASTRODUODENOSCOPY (EGD) WITH PROPOFOL;  Surgeon: Manya Silvas, MD;  Location: [facility] ENDOSCOPY;  Service: Endoscopy;  Laterality: N/A;   FRACTURE SURGERY     HARDWARE REMOVAL Left 06/18/2020   Procedure: HARDWARE REMOVAL;  Surgeon: Lovell Sheehan, MD;  Location: [facility];  Service: Orthopedics;  Laterality: Left;   HUMERUS IM NAIL Left 12/31/2019   Procedure: INTRAMEDULLARY (IM) NAIL HUMERAL;  Surgeon: Lovell Sheehan, MD;  Location: [facility];  Service: Orthopedics;  Laterality: Left;   LIPOSUCTION Right 05/11/2016   Procedure: LIPOSUCTION;  Surgeon: Irene Limbo, MD;  Location: [facility];  Service: Plastics;  Laterality: Right;   LIVER  BIOPSY  2011   MASTECTOMY SUBCUTANEOUS Bilateral 1990   MASTOPEXY Bilateral 05/11/2016   Procedure: MASTOPEXY BILATERAL ;  Surgeon: Irene Limbo, MD;  Location: [place];  Service: Plastics;  Laterality: Bilateral;   Social History   Socioeconomic History   Marital status: Married    Spouse name: Not on file   Number of children: 1   Years of education: college3   Highest education level: Not on file  Occupational History   Occupation: Retired  Tobacco Use    Smoking status: Former    Packs/day: 1.00    Years: 40.00    Total pack years: 40.00    Types: Cigarettes    Quit date: 08/28/2005    Years since quitting: 17.2   Smokeless tobacco: Never  Vaping Use   Vaping Use: Never used  Substance and Sexual Activity   Alcohol use: Not Currently   Drug use: No   Sexual activity: Never  Other Topics Concern   Not on file  Social History Narrative   Not on file   Social Determinants of Health   Financial Resource Strain: Not on file  Food Insecurity: No Food Insecurity (11/10/2022)   Hunger Vital Sign    Worried About Running Out of Food in the Last Year: Never true    [place] in the Last Year: Never true  Transportation Needs: No Transportation Needs (11/10/2022)   PRAPARE - Hydrologist (Medical): No    Lack of Transportation (Non-Medical): No  Physical Activity: Not on file  Stress: Not on file  Social Connections: Not on file   Family History  Problem Relation Age of Onset   Atrial fibrillation Mother    Atrial fibrillation Sister    Cancer Sister    Diabetes Sister    Breast cancer Sister 10   Diabetes Father    Cancer Sister    Diabetes Sister    Atrial fibrillation Sister    Kidney disease Maternal Aunt    Allergies  Allergen Reactions   Ciprofloxacin Nausea And Vomiting and Hives   Codeine Anaphylaxis and Hives    Reaction:  Unknown    Demerol [Meperidine] Anaphylaxis and Hives    Reaction:  Unknown     Fluconazole Hives   Latex Anaphylaxis and Rash   Morphine And Related Hives   Doxycycline Nausea And Vomiting   Flagyl [Metronidazole] Nausea And Vomiting    Reaction:  Unknown    Influenza Vaccines Swelling    Localized swelling   Tape Other (See Comments)    Pt allergic to Adhesive tape and latex.(  Skin breaks out)   Valtrex [Valacyclovir Hcl] Hives   Buprenorphine     Other reaction(s): Other (see comments) unknown   Duloxetine Hcl Other (See Comments)    Reaction:   Unknown    Tramadol     Other reaction(s): Not available   Amoxicillin-Pot Clavulanate Nausea And Vomiting   Sulfamethoxazole-Trimethoprim Nausea And Vomiting   Valacyclovir Rash   Prior to Admission medications   Medication Sig Start Date End Date Taking? Authorizing Provider  alum & mag hydroxide-simeth (MAALOX/MYLANTA) 200-200-20 MG/5ML suspension Take 15 mLs by mouth every 6 (six) hours as needed for indigestion or heartburn.   Yes [provider]  apixaban (ELIQUIS) 5 MG TABS tablet Take 1 tablet (5 mg total) by mouth 2 (two) times daily. 10/18/22  Yes Emeterio Reeve, DO  Cyanocobalamin (VITAMIN B-12 IJ) Inject 1,000 mg as directed every  30 (thirty) days.   Yes [provider]  cyclobenzaprine (FLEXERIL) 10 MG tablet Take 10 mg by mouth 3 (three) times daily. 07/23/22  Yes [provider]  digoxin (LANOXIN) 0.125 MG tablet Take 1 tablet (125 mcg total) by mouth daily for 7 days. 11/02/22 11/09/22 Yes Sharen Hones, MD  folic acid (FOLVITE) 1 MG tablet Take 1 tablet (1 mg total) by mouth daily. 10/18/22  Yes Emeterio Reeve, DO  metoprolol tartrate (LOPRESSOR) 50 MG tablet Take 1 tablet (50 mg total) by mouth 2 (two) times daily. 10/18/22  Yes Emeterio Reeve, DO  rosuvastatin (CRESTOR) 10 MG tablet Take 10 mg by mouth daily. 02/24/22  Yes [provider]  Vitamin D, Ergocalciferol, 50000 units CAPS Take 1 capsule by mouth once a week. 07/23/22  Yes [provider]  zolpidem (AMBIEN) 10 MG tablet Take 0.5 tablets (5 mg total) by mouth at bedtime. 10/18/22  Yes Emeterio Reeve, DO  calcium carbonate (TUMS - DOSED IN MG ELEMENTAL CALCIUM) 500 MG chewable tablet Chew 1 tablet by mouth 3 (three) times daily as needed for indigestion or heartburn.    [provider]   No results found.  Positive ROS: All other systems have been reviewed and were otherwise negative with the exception of those mentioned in the HPI and as  above.  Physical Exam: General: Alert, no acute distress Cardiovascular: No pedal edema Respiratory: No cyanosis, no use of accessory musculature GI: No organomegaly, abdomen is soft and non-tender Skin: No lesions in the area of chief complaint Neurologic: Sensation intact distally Psychiatric: Patient is competent for consent with normal mood and affect Lymphatic: No axillary or cervical lymphadenopathy  MUSCULOSKELETAL: moderate ecchymosis, swelling. Compartments soft. Good cap refill. Motor and sensory intact distally.  Assessment: Right small finger fracture, mild displacement  Plan: Patient placed in an ulnar gutter splint. She will keep this clean and dry. She may see me in the office in 7 to 10 days.    Lovell Sheehan, MD    11/16/2022 2:41 PM

## 2022-11-17 DIAGNOSIS — I951 Orthostatic hypotension: Secondary | ICD-10-CM

## 2022-11-17 DIAGNOSIS — N390 Urinary tract infection, site not specified: Secondary | ICD-10-CM

## 2022-11-17 DIAGNOSIS — A419 Sepsis, unspecified organism: Secondary | ICD-10-CM | POA: Diagnosis not present

## 2022-11-17 DIAGNOSIS — N1832 Chronic kidney disease, stage 3b: Secondary | ICD-10-CM

## 2022-11-17 DIAGNOSIS — F411 Generalized anxiety disorder: Secondary | ICD-10-CM | POA: Diagnosis not present

## 2022-11-17 DIAGNOSIS — I10 Essential (primary) hypertension: Secondary | ICD-10-CM | POA: Diagnosis not present

## 2022-11-17 DIAGNOSIS — G9341 Metabolic encephalopathy: Secondary | ICD-10-CM | POA: Diagnosis not present

## 2022-11-17 DIAGNOSIS — I480 Paroxysmal atrial fibrillation: Secondary | ICD-10-CM

## 2022-11-17 LAB — CBC
HCT: 35.5 % — ABNORMAL LOW (ref 36.0–46.0)
Hemoglobin: 11.5 g/dL — ABNORMAL LOW (ref 12.0–15.0)
MCH: 30 pg (ref 26.0–34.0)
MCHC: 32.4 g/dL (ref 30.0–36.0)
MCV: 92.7 fL (ref 80.0–100.0)
Platelets: 362 10*3/uL (ref 150–400)
RBC: 3.83 MIL/uL — ABNORMAL LOW (ref 3.87–5.11)
RDW: 13.8 % (ref 11.5–15.5)
WBC: 7.5 10*3/uL (ref 4.0–10.5)
nRBC: 0 % (ref 0.0–0.2)

## 2022-11-17 LAB — BASIC METABOLIC PANEL
Anion gap: 8 (ref 5–15)
BUN: 33 mg/dL — ABNORMAL HIGH (ref 8–23)
CO2: 22 mmol/L (ref 22–32)
Calcium: 8.5 mg/dL — ABNORMAL LOW (ref 8.9–10.3)
Chloride: 105 mmol/L (ref 98–111)
Creatinine, Ser: 1.21 mg/dL — ABNORMAL HIGH (ref 0.44–1.00)
GFR, Estimated: 46 mL/min — ABNORMAL LOW (ref >60)
Glucose, Bld: 89 mg/dL (ref 70–99)
Potassium: 4.2 mmol/L (ref 3.5–5.1)
Sodium: 135 mmol/L (ref 135–145)

## 2022-11-17 MED ORDER — HYDROCODONE-ACETAMINOPHEN 5-325 MG PO TABS
1.0000 | ORAL_TABLET | ORAL | Status: DC | PRN
  Administered 2022-11-17 – 2022-11-18 (×4): 2 via ORAL
  Filled 2022-11-17 (×4): qty 2

## 2022-11-17 NOTE — Evaluation (Signed)
Occupational Therapy Evaluation Patient Details Name: Donna Evans MRN: [id number] DOB: 08-20-1946 Today's Date: 11/17/2022   History of Present Illness 76 year old female with history of A-fib on Eliquis, hypertension, hyperlipidemia, insomnia brought to the emergency department with confusion and fall at home, complaining of back pain. Found to have chronic L2 compression fx (brace discontinued) and R 5th digit fx (in splint).  Also noted to have a-fib with RVR and orthostatic hypotension.   Clinical Impression   Chart reviewed, re- evaluation orders received from 11/15/22. Pt declined OT yesterday. Pt continues to present with deficits in strength, endurance, activity tolerance, balance, cognition affecting safe and optimal ADL completion. Pt endorses she wants to return home and "no one can make me do anything". Pt continues to require assist for ADL and functional mobility. Pt endorses dizziness has improved since yesterday, limited tolerance for further mobility other than from bed>chair. Pt is left in bedside chair, all needs met. OT will continue to follow acutely. Discharge recommendation, goals remain appropriate.      Recommendations for follow up therapy are one component of a multi-disciplinary discharge planning process, led by the attending physician.  Recommendations may be updated based on patient status, additional functional criteria and insurance authorization.   Follow Up Recommendations  Skilled nursing-short term rehab (<3 hours/day)     Assistance Recommended at Discharge Frequent or constant Supervision/Assistance  Patient can return home with the following A little help with walking and/or transfers;A little help with bathing/dressing/bathroom;Help with stairs or ramp for entrance;Direct supervision/assist for medications management    Functional Status Assessment  Patient has had a recent decline in their functional status and demonstrates the ability to make  significant improvements in function in a reasonable and predictable amount of time.  Equipment Recommendations  Other (comment) (defer to next venue of care)    Recommendations for Other Services       Precautions / Restrictions Precautions Precautions: Fall Precaution Comments: Per MD back brace discontinued (orders also discontinued); RUE in ulnar gutter splint Restrictions Other Position/Activity Restrictions: Per MD pt can hold the walker with her thumb index and long fingers      Mobility Bed Mobility                    Transfers                          Balance Overall balance assessment: Needs assistance Sitting-balance support: No upper extremity supported, Feet supported Sitting balance-Leahy Scale: Good     Standing balance support: During functional activity, Bilateral upper extremity supported, Reliant on assistive device for balance Standing balance-Leahy Scale: Poor                             ADL either performed or assessed with clinical judgement   ADL Overall ADL's : Needs assistance/impaired Eating/Feeding: Set up;Sitting   Grooming: Wash/dry face;Sitting;Set up           Upper Body Dressing : Minimal assistance   Lower Body Dressing: Moderate assistance;Sit to/from stand   Toilet Transfer: Magazine features editor Details (indicate cue type and reason): simulated to bedside chair, frequent vcs for sequencing         Functional mobility during ADLs: Min guard;Rolling walker (2 wheels)       Vision Patient Visual Report: No change from baseline       Perception  Praxis      Pertinent Vitals/Pain Pain Assessment Pain Assessment: 0-10 Pain Score: 7  Pain Location: lower back Pain Descriptors / Indicators: Aching, Sore Pain Intervention(s): Limited activity within patient's tolerance, Monitored during session, Repositioned, Premedicated before session     Hand Dominance     Extremity/Trunk  Assessment Upper Extremity Assessment Upper Extremity Assessment: Overall WFL for tasks assessed   Lower Extremity Assessment Lower Extremity Assessment: Overall WFL for tasks assessed       Communication Communication Communication: No difficulties   Cognition Arousal/Alertness: Awake/alert Behavior During Therapy: WFL for tasks assessed/performed Overall Cognitive Status: No family/caregiver present to determine baseline cognitive functioning Area of Impairment: Awareness, Safety/judgement, Problem solving                       Following Commands: Follows one step commands with increased time Safety/Judgement: Decreased awareness of safety, Decreased awareness of deficits   Problem Solving: Requires verbal cues, Requires tactile cues       General Comments  BP 128/74 (MAP 910 HR 91 seated on edge of bed; pt reports dizziness improved from yesterday    Exercises Other Exercises Other Exercises: edu re: recommendations for discharge, pt reports she wants to return home   Shoulder Instructions      Home Living Family/patient expects to be discharged to:: Private residence Living Arrangements: Alone Available Help at Discharge: Family;Available PRN/intermittently;Friend(s) Type of Home: House Home Access: Stairs to enter [employer] of Steps: 1 Entrance Stairs-Rails: None Home Layout: One level     Bathroom Shower/Tub: Occupational psychologist: Handicapped height     Home Equipment: Shower seat;Grab bars - toilet;Rollator (4 wheels);Cane - single Barista (2 wheels)          Prior Functioning/Environment Prior Level of Function : Needs assist;History of Falls (last six months)             Mobility Comments: rollator is broken per pt, fall history ADLs Comments: MOD I ADL, assist for IADLs        OT Problem List: Decreased strength;Decreased activity tolerance;Impaired balance (sitting and/or standing);Decreased  safety awareness      OT Treatment/Interventions: Self-care/ADL training;Therapeutic exercise;Energy conservation;DME and/or AE instruction;Therapeutic activities;Patient/family education;Balance training;Cognitive remediation/compensation    OT Goals(Current goals can be found in the care plan section) Acute Rehab OT Goals Patient Stated Goal: go home OT Goal Formulation: With patient Time For Goal Achievement: 12/01/22 Potential to Achieve Goals: Fair  OT Frequency: Min 2X/week    Co-evaluation              AM-PAC OT "6 Clicks" Daily Activity     Outcome Measure Help from another person eating meals?: None Help from another person taking care of personal grooming?: None Help from another person toileting, which includes using toliet, bedpan, or urinal?: A Lot Help from another person bathing (including washing, rinsing, drying)?: A Little Help from another person to put on and taking off regular upper body clothing?: A Little Help from another person to put on and taking off regular lower body clothing?: A Little 6 Click Score: 19   End of Session Equipment Utilized During Treatment: Rolling walker (2 wheels) Nurse Communication: Mobility status  Activity Tolerance: Patient tolerated treatment well Patient left: with call bell/phone within reach;in chair;with chair alarm set  OT Visit Diagnosis: Unsteadiness on feet (R26.81);History of falling (Z91.81)                Time:  6015-6153 OT Time Calculation (min): 17 min Charges:  OT General Charges $OT Visit: 1 Visit OT Evaluation $OT Re-eval: 1 Re-eval OT Treatments $Self Care/Home Management : 8-22 mins  Shanon Payor, OTD OTR/L  11/17/22, 10:37 AM

## 2022-11-17 NOTE — Progress Notes (Signed)
Orthostatic vital signs assessed during physical therapy session.     11/17/22 0950  Therapy Vitals  Patient Position (if appropriate) Orthostatic Vitals  Orthostatic Lying   BP- Lying 137/87  Pulse- Lying 85  Orthostatic Sitting  BP- Sitting 138/79  Pulse- Sitting 86  Orthostatic Standing at 0 minutes  BP- Standing at 0 minutes 133/86 (midodrine 2.'5mg'$  rec @ 0830; mild increase in 'lightheadedness' not as bad as yesterday when off midodrine.)  Pulse- Standing at 0 minutes 107   Session paused to allow MD assessment eating of breakfast. Will return to room later to finish. Post standing vitals, RVR elevated again to 130s-140s per tele monitor (107 beats measures via dinmap).   9:53 AM, 11/17/22 Etta Grandchild, PT, DPT Physical Therapist - [facility]  [phone] [facility])

## 2022-11-17 NOTE — Progress Notes (Addendum)
CSW spoke with Dr. Lonny Prude about peer-to-peer request.  He needed clarification on whether pt is amenable to SNF.  CSW spoke with pt and nephew, Ronalee Belts, who states that they're amenable to SNF.  I explained that she was denied earlier, but the insurance company is requesting to speak with her doctor.  Information relayed to doctor that peer to peer needs to take place prior to 5pm today.    2:52 Rec'd approval from insurance for SNF.  Spoke with pt and nephew, Ronalee Belts, with the update.  They will transport pt to [facility].  Updated Nettie Elm at [facility].  Ronalee Belts would like a call once we know approximate time of discharge tomorrow  CSW will follow up with pt and nephew after meeting happens with MD and insurance.

## 2022-11-17 NOTE — Assessment & Plan Note (Signed)
Patient started on midodrine with improved symptoms.

## 2022-11-17 NOTE — Progress Notes (Signed)
PROGRESS NOTE    Donna Evans  OYD:[id number] DOB: Feb 06, 1946 DOA: 11/09/2022 PCP: Jodi Marble, MD   Brief Narrative: Donna Evans is a 76 y.o. female with a history of atrial fibrillation on Eliquis, hypertension, hyperlipidemia, insomnia. Patient presented secondary to altered mental status of unclear etiology but possibly secondary to UTI. Concern for possible underlying cognitive impairment. Sepsis criteria met on admission. Patient treated empirically for a UTI with improvement in symptoms. Hospitalization complicated by atrial fibrillation with RVR and orthostatic hypotension. PT/OT recommending SNF.   Assessment and Plan: * Acute metabolic encephalopathy-resolved as of 11/17/2022 Presumed secondary to UTI but possibly multifactorial with possible polypharmacy. Encephalopathy resolved.  Sepsis (HCC)-resolved as of 11/17/2022 Patient presented with tachycardia, leukocytosis and concern for UTI based on symptoms of dysuria. Blood cultures without growth. Patient completed 3 days of Ceftriaxone for UTI treatment.  Syncope Patient found down. Concern patient may have had episode secondary to polypharmacy vs acute infection. Recent Transthoracic Echocardiogram from 10/14/22 with normal LVEF of 55-60% and no regional wall motion abnormality, although poor evaluation noted. Patient with baseline atrial fibrillation with intermittent RVR mostly with ambulation. Patient also with known orthostatic hypotension which may have been contributory as well.  Orthostatic hypotension Patient started on midodrine with improved symptoms.  UTI (urinary tract infection) Associated symptoms of dysuria. Urine culture with multiple species. Patient completed Ceftriaxone IV x3 days.  Stage 3b chronic kidney disease (CKD) (HCC) Stable.  Paroxysmal atrial fibrillation with RVR (HCC) Rate controlled at rest. Cardiology consulted with recommendations to increase Lopressor frequency to TID dosing, in  addition to addition of amiodarone. -Continue Lopressor, digoxin, amiodarone, Eliquis.  HLD (hyperlipidemia) -Continue Crestor  Hypertension, benign Patient with orthostatic hypotension as well. Complicated by atrial fibrillation and use of metoprolol.  Generalized anxiety disorder Noted.    DVT prophylaxis: Eliquis Code Status:   Code Status: DNR Family Communication: Nephew on telephone Disposition Plan: Discharge to SNF in 24 hours   Consultants:  Cardiology Palliative care medicine  Procedures:  None  Antimicrobials: Ceftriaxone    Subjective: Patient reports no issues this morning. Feels well. Patient reports not wanting long term care but is open to short term rehab. No chest pain or dyspnea.  Objective: BP 104/65 (BP Location: Left Arm)   Pulse 81   Temp 98.4 F (36.9 C) (Oral)   Resp 14   Ht 5' 7" (1.702 m)   Wt 50.8 kg   SpO2 97%   BMI 17.54 kg/m   Examination:  General exam: Appears calm and comfortable Respiratory system: Clear to auscultation. Respiratory effort normal. Cardiovascular system: S1 & S2 heard, irregular rhythm with normal rate. Gastrointestinal system: Abdomen is nondistended, soft and nontender. Normal bowel sounds heard. Central nervous system: Alert and oriented. No focal neurological deficits. Musculoskeletal: No calf tenderness Skin: No cyanosis. No rashes Psychiatry: Judgement and insight appear normal. Mood & affect appropriate.    Data Reviewed: I have personally reviewed following labs and imaging studies  CBC Lab Results  Component Value Date   WBC 7.5 11/17/2022   RBC 3.83 (L) 11/17/2022   HGB 11.5 (L) 11/17/2022   HCT 35.5 (L) 11/17/2022   MCV 92.7 11/17/2022   MCH 30.0 11/17/2022   PLT 362 11/17/2022   MCHC 32.4 11/17/2022   RDW 13.8 11/17/2022   LYMPHSABS 1.3 11/01/2022   MONOABS 0.5 11/01/2022   EOSABS 0.5 11/01/2022   BASOSABS 0.1 67/20/9470     Last metabolic panel Lab Results  Component Value  Date   NA 135 11/17/2022   K 4.2 11/17/2022   CL 105 11/17/2022   CO2 22 11/17/2022   BUN 33 (H) 11/17/2022   CREATININE 1.21 (H) 11/17/2022   GLUCOSE 89 11/17/2022   GFRNONAA 46 (L) 11/17/2022   GFRAA 51 (L) 06/17/2020   CALCIUM 8.5 (L) 11/17/2022   PHOS 2.9 11/09/2022   PROT 8.5 (H) 11/09/2022   ALBUMIN 4.1 11/09/2022   LABGLOB 2.7 10/07/2022   AGRATIO 1.0 10/07/2022   BILITOT 1.6 (H) 11/09/2022   ALKPHOS 913 (H) 11/09/2022   AST 47 (H) 11/09/2022   ALT 50 (H) 11/09/2022   ANIONGAP 8 11/17/2022    GFR: Estimated Creatinine Clearance: 31.7 mL/min (A) (by C-G formula based on SCr of 1.21 mg/dL (H)).  Recent Results (from the past 240 hour(s))  Blood culture (routine x 2)     Status: None   Collection Time: 11/09/22  2:48 PM   Specimen: BLOOD  Result Value Ref Range Status   Specimen Description BLOOD RIGHT ANTECUBITAL  Final   Special Requests   Final    BOTTLES DRAWN AEROBIC AND ANAEROBIC Blood Culture results may not be optimal due to an inadequate volume of blood received in culture bottles   Culture   Final    NO GROWTH 5 DAYS Performed at [facility], [address], [place], [place] [address]    Report Status 11/14/2022 FINAL  Final  Resp Panel by RT-PCR (Flu A&B, Covid) Anterior Nasal Swab     Status: None   Collection Time: 11/09/22  5:44 PM   Specimen: Anterior Nasal Swab  Result Value Ref Range Status   SARS Coronavirus 2 by RT PCR NEGATIVE NEGATIVE Final    Comment: (NOTE) SARS-CoV-2 target nucleic acids are NOT DETECTED.  The SARS-CoV-2 RNA is generally detectable in upper respiratory specimens during the acute phase of infection. The lowest concentration of SARS-CoV-2 viral copies this assay can detect is 138 copies/mL. A negative result does not preclude SARS-Cov-2 infection and should not be used as the sole basis for treatment or other patient management decisions. A negative result may occur with  improper specimen collection/handling,  submission of specimen other than nasopharyngeal swab, presence of viral mutation(s) within the areas targeted by this assay, and inadequate number of viral copies(<138 copies/mL). A negative result must be combined with clinical observations, patient history, and epidemiological information. The expected result is Negative.  Fact Sheet for Patients:  EntrepreneurPulse.com.au  Fact Sheet for Healthcare Providers:  IncredibleEmployment.be  This test is no t yet approved or cleared by the [place] FDA and  has been authorized for detection and/or diagnosis of SARS-CoV-2 by FDA under an Emergency Use Authorization (EUA). This EUA will remain  in effect (meaning this test can be used) for the duration of the COVID-19 declaration under Section 564(b)(1) of the Act, 21 U.S.C.section 360bbb-3(b)(1), unless the authorization is terminated  or revoked sooner.       Influenza A by PCR NEGATIVE NEGATIVE Final   Influenza B by PCR NEGATIVE NEGATIVE Final    Comment: (NOTE) The Xpert Xpress SARS-CoV-2/FLU/RSV plus assay is intended as an aid in the diagnosis of influenza from Nasopharyngeal swab specimens and should not be used as a sole basis for treatment. Nasal washings and aspirates are unacceptable for Xpert Xpress SARS-CoV-2/FLU/RSV testing.  Fact Sheet for Patients: EntrepreneurPulse.com.au  Fact Sheet for Healthcare Providers: IncredibleEmployment.be  This test is not yet approved or cleared by the [place] FDA and has  been authorized for detection and/or diagnosis of SARS-CoV-2 by FDA under an Emergency Use Authorization (EUA). This EUA will remain in effect (meaning this test can be used) for the duration of the COVID-19 declaration under Section 564(b)(1) of the Act, 21 U.S.C. section 360bbb-3(b)(1), unless the authorization is terminated or revoked.  Performed at [facility], [address], [place], [place] [address]   Urine Culture     Status: Abnormal   Collection Time: 11/09/22  5:50 PM   Specimen: Urine, Random  Result Value Ref Range Status   Specimen Description   Final    URINE, RANDOM Performed at [facility], [address] [place], [place] [address]    Special Requests   Final    NONE Performed at [facility], [address] [place], [place] [address]    Culture MULTIPLE SPECIES PRESENT, SUGGEST RECOLLECTION (A)  Final   Report Status 11/11/2022 FINAL  Final  Blood culture (routine x 2)     Status: None   Collection Time: 11/10/22  1:07 AM   Specimen: BLOOD  Result Value Ref Range Status   Specimen Description BLOOD RIGHT HAND  Final   Special Requests   Final    BOTTLES DRAWN AEROBIC AND ANAEROBIC Blood Culture adequate volume   Culture   Final    NO GROWTH 5 DAYS Performed at [facility], [address] [place], [place] [address]    Report Status 11/15/2022 FINAL  Final      Radiology Studies: No results found.    LOS: 7 days    Cordelia Poche, MD [facility] 11/17/2022, 3:25 PM   If 7PM-7AM, please contact night-coverage [email or web address]

## 2022-11-17 NOTE — Progress Notes (Addendum)
Physical Therapy Treatment Patient Details Name: Donna Evans MRN: [id number] DOB: 01/11/1946 Today's Date: 11/17/2022   History of Present Illness 76 year old female with history of A-fib on Eliquis, hypertension, hyperlipidemia, insomnia brought to the emergency department with confusion and fall at home, complaining of back pain. Found to have chronic L2 compression fx (brace discontinued) and R 5th digit fx (in splint).  Also noted to have a-fib with RVR and orthostatic hypotension.    PT Comments    Pt premedicated 60 minutes prior to session with midodrine, then later got additional meds for pain prior to AMB in room. Orthostatic vitals assessment show flat SBP, slight sustained DBP, lightheadedness with upright activity after ~30 seconds, but she tolerates continued AMB to 113f in room. RVR still easily provoked when upright in room, rates after AMB fluctuating 120s-140s bpm in AF. Despite hemodynamic abnormalities, back pain remains the biggest inhibitor of mobility- her analgesia optimized this session. Pt remains opposed to SNF placement at [place] despite acknowledging that her orthostatic BP has likely been ongoing for ~1 month now and she suspects if responsible for many of her falls PTA. Pt assisted to be at end of session, all needs met.     Recommendations for follow up therapy are one component of a multi-disciplinary discharge planning process, led by the attending physician.  Recommendations may be updated based on patient status, additional functional criteria and insurance authorization.  Follow Up Recommendations  Skilled nursing-short term rehab (<3 hours/day) Can patient physically be transported by private vehicle: Yes   Assistance Recommended at Discharge Frequent or constant Supervision/Assistance  Patient can return home with the following A little help with walking and/or transfers;A little help with bathing/dressing/bathroom;Assistance with cooking/housework;Help with  stairs or ramp for entrance;Assist for transportation   Equipment Recommendations  None recommended by PT (she has a rollator at home)    Recommendations for Other Services       Precautions / Restrictions Precautions Precautions: Fall Precaution Comments: Per MD back brace discontinued (orders also discontinued); RUE in ulnar gutter splint Restrictions Other Position/Activity Restrictions: Per MD pt can hold the walker with her thumb index and long fingers     Mobility  Bed Mobility Overal bed mobility: Needs Assistance Bed Mobility: Supine to Sit Rolling: Supervision   Supine to sit: Supervision, HOB elevated          Transfers Overall transfer level: Needs assistance Equipment used: Rolling walker (2 wheels) Transfers: Sit to/from Stand Sit to Stand: Supervision           General transfer comment: encouraged to problem solve RW use with new Rt ulnar spint    Ambulation/Gait   Gait Distance (Feet): 150 Feet Assistive device: Rolling walker (2 wheels) Gait Pattern/deviations: WFL(Within Functional Limits), Step-through pattern       General Gait Details: dizzy after first 226 which persists but pt able to tolerate 3 trips to door and back.   Stairs             Wheelchair Mobility    Modified Rankin (Stroke Patients Only)       Balance Overall balance assessment: History of Falls, Mild deficits observed, not formally tested                                          Cognition Arousal/Alertness: Awake/alert Behavior During Therapy: WFL for tasks assessed/performed Overall Cognitive Status: Within  Functional Limits for tasks assessed                                          Exercises      General Comments General comments (skin integrity, edema, etc.): BP 128/74 (MAP 910 HR 91 seated on edge of bed; pt reports dizziness improved from yesterday      Pertinent Vitals/Pain Pain Assessment Pain  Assessment: 0-10 Pain Score: 10-Worst pain ever Pain Location: lower back Pain Intervention(s): Limited activity within patient's tolerance, Monitored during session, Premedicated before session, Repositioned    Home Living Family/patient expects to be discharged to:: Private residence Living Arrangements: Alone Available Help at Discharge: Family;Available PRN/intermittently;Friend(s) Type of Home: House Home Access: Stairs to enter Entrance Stairs-Rails: None Entrance Stairs-Number of Steps: 1   Home Layout: One level Home Equipment: Shower seat;Grab bars - toilet;Rollator (4 wheels);Cane - single Barista (2 wheels)      Prior Function            PT Goals (current goals can now be found in the care plan section) Acute Rehab PT Goals Patient Stated Goal: to improve mobility and balance PT Goal Formulation: With patient Time For Goal Achievement: 11/25/22 Potential to Achieve Goals: Fair Progress towards PT goals: Progressing toward goals    Frequency    Min 2X/week      PT Plan Current plan remains appropriate    Co-evaluation              AM-PAC PT "6 Clicks" Mobility   Outcome Measure  Help needed turning from your back to your side while in a flat bed without using bedrails?: None Help needed moving from lying on your back to sitting on the side of a flat bed without using bedrails?: None Help needed moving to and from a bed to a chair (including a wheelchair)?: A Little Help needed standing up from a chair using your arms (e.g., wheelchair or bedside chair)?: A Little Help needed to walk in hospital room?: A Little Help needed climbing 3-5 steps with a railing? : A Lot 6 Click Score: 19    End of Session Equipment Utilized During Treatment: Gait belt Activity Tolerance: Patient tolerated treatment well;Patient limited by pain Patient left: in bed;with bed alarm set;with call bell/phone within reach Nurse Communication: Mobility  status;Precautions;Other (comment) PT Visit Diagnosis: Other abnormalities of gait and mobility (R26.89);Muscle weakness (generalized) (M62.81);History of falling (Z91.81);Repeated falls (R29.6);Pain Pain - Right/Left: Right     Time: 8875-7972 PT Time Calculation (min) (ACUTE ONLY): 23 min  Charges:  $Therapeutic Activity: 23-37 mins                    11:54 AM, 11/17/22 Etta Grandchild, PT, DPT Physical Therapist - [facility]  [phone] ([place])    Dottie Vaquerano C 11/17/2022, 11:48 AM

## 2022-11-18 DIAGNOSIS — R296 Repeated falls: Secondary | ICD-10-CM | POA: Diagnosis not present

## 2022-11-18 DIAGNOSIS — G4700 Insomnia, unspecified: Secondary | ICD-10-CM | POA: Diagnosis not present

## 2022-11-18 DIAGNOSIS — S52522A Torus fracture of lower end of left radius, initial encounter for closed fracture: Secondary | ICD-10-CM | POA: Diagnosis not present

## 2022-11-18 DIAGNOSIS — E559 Vitamin D deficiency, unspecified: Secondary | ICD-10-CM | POA: Diagnosis not present

## 2022-11-18 DIAGNOSIS — S62609A Fracture of unspecified phalanx of unspecified finger, initial encounter for closed fracture: Secondary | ICD-10-CM | POA: Insufficient documentation

## 2022-11-18 DIAGNOSIS — E538 Deficiency of other specified B group vitamins: Secondary | ICD-10-CM | POA: Diagnosis not present

## 2022-11-18 DIAGNOSIS — M5451 Vertebrogenic low back pain: Secondary | ICD-10-CM | POA: Diagnosis not present

## 2022-11-18 DIAGNOSIS — Z7901 Long term (current) use of anticoagulants: Secondary | ICD-10-CM | POA: Diagnosis not present

## 2022-11-18 DIAGNOSIS — E7800 Pure hypercholesterolemia, unspecified: Secondary | ICD-10-CM | POA: Diagnosis not present

## 2022-11-18 DIAGNOSIS — I130 Hypertensive heart and chronic kidney disease with heart failure and stage 1 through stage 4 chronic kidney disease, or unspecified chronic kidney disease: Secondary | ICD-10-CM | POA: Diagnosis not present

## 2022-11-18 DIAGNOSIS — F411 Generalized anxiety disorder: Secondary | ICD-10-CM | POA: Diagnosis not present

## 2022-11-18 DIAGNOSIS — I5032 Chronic diastolic (congestive) heart failure: Secondary | ICD-10-CM | POA: Diagnosis not present

## 2022-11-18 DIAGNOSIS — N1832 Chronic kidney disease, stage 3b: Secondary | ICD-10-CM | POA: Diagnosis not present

## 2022-11-18 DIAGNOSIS — R110 Nausea: Secondary | ICD-10-CM | POA: Diagnosis not present

## 2022-11-18 DIAGNOSIS — Z853 Personal history of malignant neoplasm of breast: Secondary | ICD-10-CM | POA: Diagnosis not present

## 2022-11-18 DIAGNOSIS — I110 Hypertensive heart disease with heart failure: Secondary | ICD-10-CM | POA: Diagnosis not present

## 2022-11-18 DIAGNOSIS — K580 Irritable bowel syndrome with diarrhea: Secondary | ICD-10-CM | POA: Diagnosis not present

## 2022-11-18 DIAGNOSIS — I10 Essential (primary) hypertension: Secondary | ICD-10-CM | POA: Diagnosis not present

## 2022-11-18 DIAGNOSIS — I480 Paroxysmal atrial fibrillation: Secondary | ICD-10-CM | POA: Diagnosis not present

## 2022-11-18 DIAGNOSIS — G894 Chronic pain syndrome: Secondary | ICD-10-CM | POA: Diagnosis not present

## 2022-11-18 DIAGNOSIS — G9341 Metabolic encephalopathy: Secondary | ICD-10-CM | POA: Diagnosis not present

## 2022-11-18 DIAGNOSIS — N390 Urinary tract infection, site not specified: Secondary | ICD-10-CM | POA: Diagnosis not present

## 2022-11-18 DIAGNOSIS — E782 Mixed hyperlipidemia: Secondary | ICD-10-CM | POA: Diagnosis not present

## 2022-11-18 DIAGNOSIS — M62838 Other muscle spasm: Secondary | ICD-10-CM | POA: Diagnosis not present

## 2022-11-18 DIAGNOSIS — M329 Systemic lupus erythematosus, unspecified: Secondary | ICD-10-CM | POA: Diagnosis not present

## 2022-11-18 DIAGNOSIS — I951 Orthostatic hypotension: Secondary | ICD-10-CM | POA: Diagnosis not present

## 2022-11-18 DIAGNOSIS — M797 Fibromyalgia: Secondary | ICD-10-CM | POA: Diagnosis not present

## 2022-11-18 DIAGNOSIS — S62606D Fracture of unspecified phalanx of right little finger, subsequent encounter for fracture with routine healing: Secondary | ICD-10-CM | POA: Diagnosis not present

## 2022-11-18 DIAGNOSIS — J449 Chronic obstructive pulmonary disease, unspecified: Secondary | ICD-10-CM | POA: Diagnosis not present

## 2022-11-18 DIAGNOSIS — Z87891 Personal history of nicotine dependence: Secondary | ICD-10-CM | POA: Diagnosis not present

## 2022-11-18 DIAGNOSIS — A419 Sepsis, unspecified organism: Secondary | ICD-10-CM | POA: Diagnosis not present

## 2022-11-18 DIAGNOSIS — S62636D Displaced fracture of distal phalanx of right little finger, subsequent encounter for fracture with routine healing: Secondary | ICD-10-CM | POA: Diagnosis not present

## 2022-11-18 DIAGNOSIS — R55 Syncope and collapse: Secondary | ICD-10-CM | POA: Diagnosis not present

## 2022-11-18 MED ORDER — MIDODRINE HCL 2.5 MG PO TABS: 2.5000 mg | ORAL_TABLET | Freq: Three times a day (TID) | ORAL | Status: DC

## 2022-11-18 MED ORDER — METOPROLOL TARTRATE 50 MG PO TABS: 50.0000 mg | ORAL_TABLET | Freq: Three times a day (TID) | ORAL | Status: DC

## 2022-11-18 MED ORDER — AMIODARONE HCL 200 MG PO TABS: ORAL_TABLET | ORAL | Status: DC

## 2022-11-18 MED ORDER — ENSURE ENLIVE PO LIQD: 237.0000 mL | Freq: Two times a day (BID) | ORAL | Status: DC

## 2022-11-18 MED ORDER — ZOLPIDEM TARTRATE 10 MG PO TABS: 5.0000 mg | ORAL_TABLET | Freq: Every day | ORAL | 0 refills | Status: DC

## 2022-11-18 MED ORDER — CYCLOBENZAPRINE HCL 7.5 MG PO TABS: 7.5000 mg | ORAL_TABLET | Freq: Three times a day (TID) | ORAL | 0 refills | Status: DC | PRN

## 2022-11-18 MED ORDER — LIDOCAINE 5 % EX PTCH: 1.0000 | MEDICATED_PATCH | CUTANEOUS | 0 refills | Status: DC

## 2022-11-18 MED ORDER — HYDROCODONE-ACETAMINOPHEN 5-325 MG PO TABS: 1.0000 | ORAL_TABLET | ORAL | 0 refills | Status: DC | PRN

## 2022-11-18 MED ORDER — ADULT MULTIVITAMIN W/MINERALS CH: 1.0000 | ORAL_TABLET | Freq: Every day | ORAL | Status: DC

## 2022-11-18 MED ORDER — DIGOXIN 125 MCG PO TABS: 125.0000 ug | ORAL_TABLET | Freq: Every day | ORAL | 0 refills | Status: DC

## 2022-11-18 NOTE — Plan of Care (Signed)
  Problem: Education: Goal: Knowledge of General Education information will improve Description: Including pain rating scale, medication(s)/side effects and non-pharmacologic comfort measures Outcome: Progressing   Problem: Health Behavior/Discharge Planning: Goal: Ability to manage health-related needs will improve Outcome: Progressing   Problem: Activity: Goal: Risk for activity intolerance will decrease Outcome: Not Progressing   Problem: Coping: Goal: Level of anxiety will decrease Outcome: Not Progressing   Problem: Elimination: Goal: Will not experience complications related to bowel motility Outcome: Progressing   Problem: Pain Managment: Goal: General experience of comfort will improve Outcome: Progressing

## 2022-11-18 NOTE — Progress Notes (Signed)
Patient will discharge to [facility].  CSW phoned nephew with update.  Nephew wants her to be transported by EMS.  Discharge packet placed in chart.  [place], [place]

## 2022-11-18 NOTE — TOC Transition Note (Signed)
Transition of Care [facility]) - CM/SW Discharge Note   Patient Details  Name: Donna Evans MRN: [id number] Date of Birth: [birth year]  [facility] CM/SW Contact:  Quin Hoop, LCSW Phone Number: 11/18/2022, 2:18 PM   Clinical Narrative:    Patient discharge orders complete.  Nephew Ronalee Belts has been called to let him know pt is going to [facility].  Pt transport by [facility].  Call report number: [phone].  Room 501  Final next level of care: [place] Barriers to Discharge: No Barriers Identified   Patient Goals and CMS Choice Patient states their goals for this hospitalization and ongoing recovery are:: to return home   Choice offered to / list presented to : Patient  Discharge Placement              Patient chooses bed at: [facility] Patient to be transferred to facility by: [facility] Name of family member notified: Duane Boston [phone] Patient and family notified of of transfer: 11/18/22  Discharge Plan and Services     Post Acute Care Choice: Home Health                    HH Arranged: RN, PT, OT, Nurse's Aide, Social Work [place] Agency: [place] Date [facility] Agency Contacted: 11/11/22   Representative spoke with at [place]: [place] [facility]  Social Determinants of Health ([place]) Interventions     Readmission Risk Interventions     No data to display

## 2022-11-18 NOTE — Progress Notes (Signed)
Discharge instructions went over and provided to patient. AVS placed in DC packet for receiving facility. IV removed, patient discharged with family member who will drive patient to [facility]. No complaints at this time.

## 2022-11-18 NOTE — Discharge Summary (Signed)
Physician Discharge Summary   Patient: Donna Evans MRN: [id number] DOB: July 06, 1946  Admit date:     11/09/2022  Discharge date: 11/18/22  Discharge Physician: Cordelia Poche, MD   PCP: Jodi Marble, MD   Recommendations at discharge:  PCP and Cardiology follow-up Continue ulnar splint and follow-up with orthopedic surgery  Discharge Diagnoses: Active Problems:   Syncope   Fibromyalgia syndrome   Generalized anxiety disorder   Chronic pain syndrome   Hypertension, benign   Memory change   HLD (hyperlipidemia)   Paroxysmal atrial fibrillation with RVR (HCC)   Stage 3b chronic kidney disease (CKD) (HCC)   UTI (urinary tract infection)   Orthostatic hypotension  Principal Problem (Resolved):   Acute metabolic encephalopathy Resolved Problems:   Sepsis ([place])   Altered mental status  Hospital Course: Donna Evans is a 76 y.o. female with a history of atrial fibrillation on Eliquis, hypertension, hyperlipidemia, insomnia. Patient presented secondary to altered mental status of unclear etiology but possibly secondary to UTI. Concern for possible underlying cognitive impairment. Sepsis criteria met on admission. Patient treated empirically for a UTI with improvement in symptoms. Hospitalization complicated by atrial fibrillation with RVR and orthostatic hypotension. PT/OT recommending SNF.  Assessment and Plan: * Acute metabolic encephalopathy-resolved as of 11/17/2022 Presumed secondary to UTI but possibly multifactorial with possible polypharmacy. Encephalopathy resolved.  Sepsis (HCC)-resolved as of 11/17/2022 Patient presented with tachycardia, leukocytosis and concern for UTI based on symptoms of dysuria. Blood cultures without growth. Patient completed 3 days of Ceftriaxone for UTI treatment.  Syncope Patient found down. Concern patient may have had episode secondary to polypharmacy vs acute infection. Recent Transthoracic Echocardiogram from 10/14/22 with normal LVEF of  55-60% and no regional wall motion abnormality, although poor evaluation noted. Patient with baseline atrial fibrillation with intermittent RVR mostly with ambulation. Patient also with known orthostatic hypotension which may have been contributory as well.  Fracture, finger Right fifth finger. Mildly displaced. Orthopedic surgery placed ulnar gutter splint and recommend outpatient follow-up in one week.  Orthostatic hypotension Patient started on midodrine with improved symptoms.  UTI (urinary tract infection) Associated symptoms of dysuria. Urine culture with multiple species. Patient completed Ceftriaxone IV x3 days.  Stage 3b chronic kidney disease (CKD) (HCC) Stable.  Paroxysmal atrial fibrillation with RVR (HCC) Rate controlled at rest. Cardiology consulted with recommendations to increase Lopressor frequency to TID dosing, in addition to addition of amiodarone. Continue Lopressor,  amiodarone, Eliquis.  HLD (hyperlipidemia) Continue Crestor  Hypertension, benign Patient with orthostatic hypotension as well. Complicated by atrial fibrillation and use of metoprolol.  Chronic pain syndrome Most pain is relegated to back from known L2 compression fracture. Continue analgesic therapy.  Generalized anxiety disorder Noted.   Consultants: Cardiology, orthopedic surgery Procedures performed: None  Disposition: Skilled nursing facility Diet recommendation: Cardiac diet   DISCHARGE MEDICATION: Allergies as of 11/18/2022       Reactions   Ciprofloxacin Nausea And Vomiting, Hives   Codeine Anaphylaxis, Hives   Reaction:  Unknown    Demerol [meperidine] Anaphylaxis, Hives   Reaction:  Unknown    Fluconazole Hives   Latex Anaphylaxis, Rash   Morphine And Related Hives   Doxycycline Nausea And Vomiting   Flagyl [metronidazole] Nausea And Vomiting   Reaction:  Unknown    Influenza Vaccines Swelling   Localized swelling   Tape Other (See Comments)   Pt allergic to Adhesive  tape and latex.(  Skin breaks out)   Valtrex [valacyclovir Hcl] Hives   Buprenorphine  Other reaction(s): Other (see comments) unknown   Duloxetine Hcl Other (See Comments)   Reaction:  Unknown    Tramadol    Other reaction(s): Not available   Amoxicillin-pot Clavulanate Nausea And Vomiting   Sulfamethoxazole-trimethoprim Nausea And Vomiting   Valacyclovir Rash        Medication List     STOP taking these medications    digoxin 0.125 MG tablet Commonly known as: Lanoxin       TAKE these medications    alum & mag hydroxide-simeth [id number] MG/5ML suspension Commonly known as: MAALOX/MYLANTA Take 15 mLs by mouth every 6 (six) hours as needed for indigestion or heartburn.   amiodarone 200 MG tablet Commonly known as: PACERONE Take 1 tablet (200 mg total) by mouth 2 (two) times daily for 12 days, THEN 1 tablet (200 mg total) daily. Start taking on: November 18, 2022   apixaban 5 MG Tabs tablet Commonly known as: ELIQUIS Take 1 tablet (5 mg total) by mouth 2 (two) times daily.   calcium carbonate 500 MG chewable tablet Commonly known as: TUMS - dosed in mg elemental calcium Chew 1 tablet by mouth 3 (three) times daily as needed for indigestion or heartburn.   cyclobenzaprine 7.5 MG tablet Commonly known as: FEXMID Take 1 tablet (7.5 mg total) by mouth 3 (three) times daily as needed for muscle spasms. What changed:  medication strength how much to take when to take this reasons to take this   feeding supplement Liqd Take 237 mLs by mouth 2 (two) times daily between meals. Start taking on: November 19, 6833   folic acid 1 MG tablet Commonly known as: FOLVITE Take 1 tablet (1 mg total) by mouth daily.   HYDROcodone-acetaminophen 5-325 MG tablet Commonly known as: NORCO/VICODIN Take 1-2 tablets by mouth every 4 (four) hours as needed for moderate pain or severe pain.   lidocaine 5 % Commonly known as: LIDODERM Place 1 patch onto the skin daily. Remove &  Discard patch within 12 hours or as directed by MD. Apply to back. Start taking on: November 19, 2022   metoprolol tartrate 50 MG tablet Commonly known as: LOPRESSOR Take 1 tablet (50 mg total) by mouth 3 (three) times daily. What changed: when to take this   midodrine 2.5 MG tablet Commonly known as: PROAMATINE Take 1 tablet (2.5 mg total) by mouth 3 (three) times daily with meals.   multivitamin with minerals Tabs tablet Take 1 tablet by mouth daily. Start taking on: November 19, 2022   rosuvastatin 10 MG tablet Commonly known as: CRESTOR Take 10 mg by mouth daily.   VITAMIN B-12 IJ Inject 1,000 mg as directed every 30 (thirty) days.   Vitamin D (Ergocalciferol) 50000 units Caps Take 1 capsule by mouth once a week.   zolpidem 10 MG tablet Commonly known as: AMBIEN Take 0.5 tablets (5 mg total) by mouth at bedtime.        Follow-up Information     Paraschos, Alexander, MD. Go in 1 week(s).   Specialty: Cardiology Contact information: [facility] [place] [address] [phone]         Jodi Marble, MD. Schedule an appointment as soon as possible for a visit in 1 week(s).   Specialty: Internal Medicine Why: For hospital follow-up Contact information: [address] [place] [address] [phone]                Discharge Exam: BP 110/70 (BP Location: Right Arm)  Pulse 73   Temp 97.9 F (36.6 C)   Resp 18   Ht _0  (1.702 m)   Wt 50.8 kg   SpO2 95%   BMI 17.54 kg/m   General exam: Appears calm and comfortable Respiratory system: Clear to auscultation. Respiratory effort normal. Cardiovascular system: S1 & S2 heard, irregular rhythm, normal rate. Gastrointestinal system: Abdomen is nondistended, soft and nontender. No organomegaly or masses felt. Normal bowel sounds heard. Central nervous system: Alert and oriented. No focal neurological deficits. Musculoskeletal: No edema. No calf  tenderness. Right hand in splint Skin: No cyanosis. No rashes Psychiatry: Judgement and insight appear normal. Mood & affect appropriate.   Condition at discharge: stable  The results of significant diagnostics from this hospitalization (including imaging, microbiology, ancillary and laboratory) are listed below for reference.   Imaging Studies: DG Chest Port 1 View  Result Date: 11/12/2022 CLINICAL DATA:  Chest pain EXAM: PORTABLE CHEST 1 VIEW COMPARISON:  11/09/2022 FINDINGS: Cardiac shadow is within normal limits. Lungs are well aerated bilaterally. No focal infiltrate or sizable effusion is seen. Central vascular congestion is noted without edema. Bilateral breast implants are noted. Old healed rib fractures are noted bilaterally. IMPRESSION: Mild vascular congestion.  No other focal abnormality is noted. Electronically Signed   By: Inez Catalina M.D.   On: 11/12/2022 01:28   DG Finger Little Right  Result Date: 11/11/2022 CLINICAL DATA:  Finger injury, superficial. Infection. Patient felt a pop and finger 1 week ago. EXAM: RIGHT LITTLE FINGER 2+V COMPARISON:  None Available. FINDINGS: There is oblique linear lucency within the mid and distal shaft and distal metaphysis of the proximal phalanx of fifth finger, extending from the lateral cortex of the distal metaphysis through the medial cortex of the proximal shaft. There is up to 1.5 mm lateral displacement of the distal fracture component with respect to the proximal fracture component. No significant AP dimension displacement. Moderate DIP and mild PIP joint space narrowing. No dislocation. IMPRESSION: Acute to subacute minimally displaced oblique fracture of the proximal phalanx of the fifth finger. Electronically Signed   By: Yvonne Kendall M.D.   On: 11/11/2022 14:43   MR BRAIN WO CONTRAST  Result Date: 11/09/2022 CLINICAL DATA:  Stroke follow-up EXAM: MRI HEAD WITHOUT CONTRAST TECHNIQUE: Multiplanar, multiecho pulse sequences of the  brain and surrounding structures were obtained without intravenous contrast. COMPARISON:  None Available. FINDINGS: Brain: No acute infarct, mass effect or extra-axial collection. No acute or chronic hemorrhage. There is multifocal hyperintense T2-weighted signal within the white matter. Generalized volume loss. The midline structures are normal. Vascular: Major flow voids are preserved. Skull and upper cervical spine: Normal calvarium and skull base. Visualized upper cervical spine and soft tissues are normal. Sinuses/Orbits:Fluid in the right maxillary and sphenoid sinuses. No mastoid or middle ear effusion. Normal orbits. IMPRESSION: 1. No acute intracranial abnormality. 2. Findings of chronic small vessel ischemia and volume loss. Electronically Signed   By: Ulyses Jarred M.D.   On: 11/09/2022 19:02   CT CHEST ABDOMEN PELVIS W CONTRAST  Result Date: 11/09/2022 CLINICAL DATA:  Blunt trauma. Patient found down for unknown period of time. Uncooperative patient. EXAM: CT CHEST, ABDOMEN, AND PELVIS WITH CONTRAST TECHNIQUE: Multidetector CT imaging of the chest, abdomen and pelvis was performed following the standard protocol during bolus administration of intravenous contrast. RADIATION DOSE REDUCTION: This exam was performed according to the departmental dose-optimization program which includes automated exposure control, adjustment of the mA and/or kV according to patient size and/or use  of iterative reconstruction technique. CONTRAST:  36m OMNIPAQUE IOHEXOL 300 MG/ML  SOLN COMPARISON:  None Available. FINDINGS: CT CHEST FINDINGS Cardiovascular: No significant vascular findings. Normal heart size. No pericardial effusion. Mediastinum/Nodes: No axillary or supraclavicular adenopathy. No mediastinal or hilar adenopathy. No pericardial fluid. Esophagus normal. Lungs/Pleura: No pneumothorax. Pulmonary contusion. No pleural fluid. No pneumonia. Biapical pleuroparenchymal thickening favored benign. Centrilobular  emphysema the upper lobes. Musculoskeletal: Healed posterior rib fractures. No acute rib fracture. CT ABDOMEN AND PELVIS FINDINGS Hepatobiliary: No hepatic laceration. Postcholecystectomy. Mild intra and extra hepatic biliary duct dilatation is favored chronic. Pancreas: Pancreas is normal. No ductal dilatation. No pancreatic inflammation. Spleen: No splenic injury. Adrenals/urinary tract: Thickening LEFT adrenal gland to 2 cm (image 51/4. Washout of contrast (greater than 40%) on delayed imaging indicates benign adrenal adenoma. Bilateral renal cortical thinning. Ureters normal. Bladder intact Stomach/Bowel: The stomach, duodenum, and small bowel normal. The colon and rectosigmoid colon are normal. Vascular/Lymphatic: Abdominal aorta is normal caliber with atherosclerotic calcification. There is no retroperitoneal or periportal lymphadenopathy. No pelvic lymphadenopathy. Reproductive: Post hysterectomy.  Adnexa unremarkable Other: No free fluid. Musculoskeletal: Superior endplate compression fracture at L2. There is sclerosis along the depressed fracture plane. No paraspinal hematoma evident. Approximately 26% loss vertebral body height centrally (image 84/8 and image 52/7). No significant retropulsion. IMPRESSION: Chest Impression: 1. No evidence of thoracic trauma. 2. Bilateral most rib fractures. 3. Biapical chronic pleuroparenchymal scarring. Abdomen / Pelvis Impression: 1. Age-indeterminate compression fracture at L2. Proximally 20% loss vertebral body height. No significant retropulsion. 2. Bilateral renal cortical thinning. 3. Mild biliary duct dilatation following cholecystectomy. Consider correlation with bilirubin levels. 4. Benign LEFT adrenal adenoma. Electronically Signed   By: SSuzy BouchardM.D.   On: 11/09/2022 16:07   CT Cervical Spine Wo Contrast  Result Date: 11/09/2022 CLINICAL DATA:  Found down EXAM: CT CERVICAL SPINE WITHOUT CONTRAST TECHNIQUE: Multidetector CT imaging of the cervical  spine was performed without intravenous contrast. Multiplanar CT image reconstructions were also generated. RADIATION DOSE REDUCTION: This exam was performed according to the departmental dose-optimization program which includes automated exposure control, adjustment of the mA and/or kV according to patient size and/or use of iterative reconstruction technique. COMPARISON:  11/01/2022 FINDINGS: Alignment: Normal. Skull base and vertebrae: No acute fracture. No primary bone lesion or focal pathologic process. Soft tissues and spinal canal: No prevertebral fluid or swelling. No visible canal hematoma. Disc levels: Multilevel degenerative disc disease noted with loss of disc space and marginal osteophytes from the C3 through the C7 levels. There is osteoarthritis identified at C1-C2. Facet joint degenerative changes noted most severely at C3-4 on the right and bilaterally at C4-5. Upper chest: Negative. Other: None. IMPRESSION: Multilevel degenerative changes.  No acute traumatic abnormalities. Electronically Signed   By: JSammie BenchM.D.   On: 11/09/2022 15:55   CT Head Wo Contrast  Result Date: 11/09/2022 CLINICAL DATA:  Mental status change, unknown cause EXAM: CT HEAD WITHOUT CONTRAST TECHNIQUE: Contiguous axial images were obtained from the base of the skull through the vertex without intravenous contrast. RADIATION DOSE REDUCTION: This exam was performed according to the departmental dose-optimization program which includes automated exposure control, adjustment of the mA and/or kV according to patient size and/or use of iterative reconstruction technique. COMPARISON:  11/01/2022 FINDINGS: Brain: There is periventricular white matter decreased attenuation consistent with small vessel ischemic changes. Ventricles, sulci and cisterns are prominent consistent with age related involutional changes. No acute intracranial hemorrhage, mass effect or shift. No hydrocephalus. Vascular: No hyperdense vessel  or  unexpected calcification. Skull: Normal. Negative for fracture or focal lesion. Sinuses/Orbits: No acute finding. IMPRESSION: Atrophy and chronic small vessel ischemic changes. No acute intracranial process identified. Electronically Signed   By: Sammie Bench M.D.   On: 11/09/2022 15:53   DG Chest 1 View  Result Date: 11/09/2022 CLINICAL DATA:  Altered mental status EXAM: CHEST  1 VIEW COMPARISON:  10/29/2022 FINDINGS: Remote proximal left humerus fracture. Remote posterior upper left rib fractures. Patient rotated to the right. Midline trachea. Normal heart size. Mild right hemidiaphragm elevation. No pleural effusion or pneumothorax. Bilateral breast implants, projecting over the lungs. No lobar consolidation. IMPRESSION: No acute cardiopulmonary disease. Electronically Signed   By: Abigail Miyamoto M.D.   On: 11/09/2022 15:20   MR BRAIN WO CONTRAST  Result Date: 11/02/2022 CLINICAL DATA:  Fall, altered mental status EXAM: MRI HEAD WITHOUT CONTRAST TECHNIQUE: Multiplanar, multiecho pulse sequences of the brain and surrounding structures were obtained without intravenous contrast. COMPARISON:  10/06/2022 MRI head, correlation is also made with CT head 11/01/2022 FINDINGS: Brain: No restricted diffusion to suggest acute or subacute infarct. No acute hemorrhage, mass, mass effect, or midline shift. No hydrocephalus or extra-axial collection. Scattered and confluent T2 hyperintense signal in the periventricular white matter, likely the sequela of mild-to-moderate chronic small vessel ischemic disease. Vascular: Normal arterial flow voids. Skull and upper cervical spine: Normal marrow signal. Left parietal scalp hematoma. Sinuses/Orbits: Air-fluid levels in the right maxillary and sphenoid sinuses. Status post bilateral lens replacements. Other: Trace fluid in bilateral mastoid tips. IMPRESSION: 1. No acute intracranial process. 2. Air-fluid levels in the right maxillary and sphenoid sinuses, which are  nonspecific but can be seen in the setting of acute sinusitis. Electronically Signed   By: Merilyn Baba M.D.   On: 11/02/2022 00:23   CT HEAD WO CONTRAST (5MM)  Result Date: 11/01/2022 CLINICAL DATA:  Head trauma, minor (Age >= 65y) Pt on Eliquis; Neck trauma (Age >= 65y) EXAM: CT HEAD WITHOUT CONTRAST CT CERVICAL SPINE WITHOUT CONTRAST TECHNIQUE: Multidetector CT imaging of the head and cervical spine was performed following the standard protocol without intravenous contrast. Multiplanar CT image reconstructions of the cervical spine were also generated. RADIATION DOSE REDUCTION: This exam was performed according to the departmental dose-optimization program which includes automated exposure control, adjustment of the mA and/or kV according to patient size and/or use of iterative reconstruction technique. COMPARISON:  None Available. FINDINGS: CT HEAD FINDINGS Brain: No evidence of acute infarction, hemorrhage, hydrocephalus, extra-axial collection or mass lesion/mass effect. Patchy white matter hypodensities, compatible with chronic microvascular ischemic disease. Vascular: No hyperdense vessel. Skull: No acute fracture.  High left posterior scalp contusion. Sinuses/Orbits: Frothy secretions in the right sphenoid and maxillary sinuses. Other: No mastoid effusions. CT CERVICAL SPINE FINDINGS Alignment: Normal. Skull base and vertebrae: No evidence of acute fracture vertebral body heights are maintained. Soft tissues and spinal canal: No prevertebral fluid or swelling. No visible canal hematoma. Disc levels: Moderate multilevel degenerative disc disease. Multilevel facet and uncovertebral hypertrophy with varying degrees of neural foraminal stenosis. Upper chest: Biapical pleuroparenchymal scarring.  Emphysema. IMPRESSION: 1. No evidence of acute intracranial abnormality. High left posterior scalp contusion. 2. No evidence of acute fracture or traumatic malalignment in the cervical spine. Multilevel  degenerative change. 3.  Emphysema (ICD10-J43.9). Electronically Signed   By: Margaretha Sheffield M.D.   On: 11/01/2022 15:40   CT Cervical Spine Wo Contrast  Result Date: 11/01/2022 CLINICAL DATA:  Head trauma, minor (Age >= 65y) Pt on Eliquis;  Neck trauma (Age >= 65y) EXAM: CT HEAD WITHOUT CONTRAST CT CERVICAL SPINE WITHOUT CONTRAST TECHNIQUE: Multidetector CT imaging of the head and cervical spine was performed following the standard protocol without intravenous contrast. Multiplanar CT image reconstructions of the cervical spine were also generated. RADIATION DOSE REDUCTION: This exam was performed according to the departmental dose-optimization program which includes automated exposure control, adjustment of the mA and/or kV according to patient size and/or use of iterative reconstruction technique. COMPARISON:  None Available. FINDINGS: CT HEAD FINDINGS Brain: No evidence of acute infarction, hemorrhage, hydrocephalus, extra-axial collection or mass lesion/mass effect. Patchy white matter hypodensities, compatible with chronic microvascular ischemic disease. Vascular: No hyperdense vessel. Skull: No acute fracture.  High left posterior scalp contusion. Sinuses/Orbits: Frothy secretions in the right sphenoid and maxillary sinuses. Other: No mastoid effusions. CT CERVICAL SPINE FINDINGS Alignment: Normal. Skull base and vertebrae: No evidence of acute fracture vertebral body heights are maintained. Soft tissues and spinal canal: No prevertebral fluid or swelling. No visible canal hematoma. Disc levels: Moderate multilevel degenerative disc disease. Multilevel facet and uncovertebral hypertrophy with varying degrees of neural foraminal stenosis. Upper chest: Biapical pleuroparenchymal scarring.  Emphysema. IMPRESSION: 1. No evidence of acute intracranial abnormality. High left posterior scalp contusion. 2. No evidence of acute fracture or traumatic malalignment in the cervical spine. Multilevel degenerative  change. 3.  Emphysema (ICD10-J43.9). Electronically Signed   By: Margaretha Sheffield M.D.   On: 11/01/2022 15:40   DG Chest Portable 1 View  Result Date: 10/29/2022 CLINICAL DATA:  Syncope. EXAM: PORTABLE CHEST 1 VIEW COMPARISON:  CT of the chest 03/22/2014. chest x-ray 10/06/2022. FINDINGS: The heart size and mediastinal contours are within normal limits. Both lungs are clear. There are healed left eighth and ninth rib fractures which are new from prior study. Healed left humeral fracture again noted. IMPRESSION: No active disease. Electronically Signed   By: Ronney Asters M.D.   On: 10/29/2022 18:29   CT Head Wo Contrast  Result Date: 10/29/2022 CLINICAL DATA:  Syncopal episode lasting 10 seconds. EXAM: CT HEAD WITHOUT CONTRAST TECHNIQUE: Contiguous axial images were obtained from the base of the skull through the vertex without intravenous contrast. RADIATION DOSE REDUCTION: This exam was performed according to the departmental dose-optimization program which includes automated exposure control, adjustment of the mA and/or kV according to patient size and/or use of iterative reconstruction technique. COMPARISON:  CT head without contrast 10/07/2022. MR head without contrast 10/06/2022. FINDINGS: Brain: Moderate atrophy and white matter changes are stable. No acute infarct, hemorrhage, or mass lesion is present. The ventricles are proportionate to the degree of atrophy. No significant extraaxial fluid collection is present. Basal ganglia are intact. The brainstem and cerebellum are within normal limits. Vascular: No hyperdense vessel or unexpected calcification. Skull: Calvarium is intact. No focal lytic or blastic lesions are present. No significant extracranial soft tissue lesion is present. Sinuses/Orbits: Fluid is present in the right sphenoid sinus. Fluid is also present right maxillary sinus. The paranasal sinuses and mastoid air cells are otherwise clear. Bilateral lens replacements are noted.  Globes and orbits are otherwise unremarkable. IMPRESSION: 1. No acute intracranial abnormality or significant interval change. 2. Stable atrophy and white matter disease. This likely reflects the sequela of chronic microvascular ischemia. Electronically Signed   By: San Morelle M.D.   On: 10/29/2022 17:01    Microbiology: Results for orders placed or performed during the hospital encounter of 11/09/22  Blood culture (routine x 2)     Status: None  Collection Time: 11/09/22  2:48 PM   Specimen: BLOOD  Result Value Ref Range Status   Specimen Description BLOOD RIGHT ANTECUBITAL  Final   Special Requests   Final    BOTTLES DRAWN AEROBIC AND ANAEROBIC Blood Culture results may not be optimal due to an inadequate volume of blood received in culture bottles   Culture   Final    NO GROWTH 5 DAYS Performed at [facility], [address] [place], [place] [address]    Report Status 11/14/2022 FINAL  Final  Resp Panel by RT-PCR (Flu A&B, Covid) Anterior Nasal Swab     Status: None   Collection Time: 11/09/22  5:44 PM   Specimen: Anterior Nasal Swab  Result Value Ref Range Status   SARS Coronavirus 2 by RT PCR NEGATIVE NEGATIVE Final    Comment: (NOTE) SARS-CoV-2 target nucleic acids are NOT DETECTED.  The SARS-CoV-2 RNA is generally detectable in upper respiratory specimens during the acute phase of infection. The lowest concentration of SARS-CoV-2 viral copies this assay can detect is 138 copies/mL. A negative result does not preclude SARS-Cov-2 infection and should not be used as the sole basis for treatment or other patient management decisions. A negative result may occur with  improper specimen collection/handling, submission of specimen other than nasopharyngeal swab, presence of viral mutation(s) within the areas targeted by this assay, and inadequate number of viral copies(<138 copies/mL). A negative result must be combined with clinical observations, patient  history, and epidemiological information. The expected result is Negative.  Fact Sheet for Patients:  EntrepreneurPulse.com.au  Fact Sheet for Healthcare Providers:  IncredibleEmployment.be  This test is no t yet approved or cleared by the [place] FDA and  has been authorized for detection and/or diagnosis of SARS-CoV-2 by FDA under an Emergency Use Authorization (EUA). This EUA will remain  in effect (meaning this test can be used) for the duration of the COVID-19 declaration under Section 564(b)(1) of the Act, 21 U.S.C.section 360bbb-3(b)(1), unless the authorization is terminated  or revoked sooner.       Influenza A by PCR NEGATIVE NEGATIVE Final   Influenza B by PCR NEGATIVE NEGATIVE Final    Comment: (NOTE) The Xpert Xpress SARS-CoV-2/FLU/RSV plus assay is intended as an aid in the diagnosis of influenza from Nasopharyngeal swab specimens and should not be used as a sole basis for treatment. Nasal washings and aspirates are unacceptable for Xpert Xpress SARS-CoV-2/FLU/RSV testing.  Fact Sheet for Patients: EntrepreneurPulse.com.au  Fact Sheet for Healthcare Providers: IncredibleEmployment.be  This test is not yet approved or cleared by the [place] FDA and has been authorized for detection and/or diagnosis of SARS-CoV-2 by FDA under an Emergency Use Authorization (EUA). This EUA will remain in effect (meaning this test can be used) for the duration of the COVID-19 declaration under Section 564(b)(1) of the Act, 21 U.S.C. section 360bbb-3(b)(1), unless the authorization is terminated or revoked.  Performed at [facility], [address], [place], [place] [address]   Urine Culture     Status: Abnormal   Collection Time: 11/09/22  5:50 PM   Specimen: Urine, Random  Result Value Ref Range Status   Specimen Description   Final    URINE, RANDOM Performed at [facility], [address], [place], [place] [address]    Special Requests   Final    NONE Performed at [facility], [address] [place], [place] [address]    Culture MULTIPLE SPECIES PRESENT, SUGGEST RECOLLECTION (A)  Final   Report Status 11/11/2022 FINAL  Final  Blood culture (routine x 2)     Status: None   Collection Time: 11/10/22  1:07 AM   Specimen: BLOOD  Result Value Ref Range Status   Specimen Description BLOOD RIGHT HAND  Final   Special Requests   Final    BOTTLES DRAWN AEROBIC AND ANAEROBIC Blood Culture adequate volume   Culture   Final    NO GROWTH 5 DAYS Performed at [facility], [address] [place], [place] [address]    Report Status 11/15/2022 FINAL  Final    Labs: CBC: Recent Labs  Lab 11/12/22 0810 11/13/22 0545 11/15/22 0455 11/17/22 0634  WBC 10.9* 8.8 7.1 7.5  HGB 10.8* 9.8* 11.2* 11.5*  HCT 31.6* 30.4* 33.8* 35.5*  MCV 90.8 95.3 91.6 92.7  PLT 173 160 277 056   Basic Metabolic Panel: Recent Labs  Lab 11/12/22 0810 11/13/22 0545 11/14/22 0543 11/15/22 0455 11/17/22 0634  NA 139 139 135 134* 135  K 4.3 4.7 4.5 4.1 4.2  CL 108 109 104 104 105  CO2 23 23 21* 23 22  GLUCOSE 121* 112* 79 106* 89  BUN 25* 32* 30* 37* 33*  CREATININE 1.13* 1.31* 1.10* 1.34* 1.21*  CALCIUM 8.4* 8.4* 8.7* 8.2* 8.5*    Discharge time spent: 35 minutes.  Signed: Cordelia Poche, MD [facility] 11/18/2022

## 2022-11-18 NOTE — Assessment & Plan Note (Signed)
Right fifth finger. Mildly displaced. Orthopedic surgery placed ulnar gutter splint and recommend outpatient follow-up in one week.

## 2022-11-18 NOTE — Assessment & Plan Note (Signed)
Most pain is relegated to back from known L2 compression fracture. Continue analgesic therapy.

## 2022-11-18 NOTE — Progress Notes (Signed)
Transport cancelled per pt's nephew.  Patient would have to pay out of pocket for transport and PT states that she can be transported by family.  Informed nephew that patient is ready and he can come pick her up and take her to [facility].  Judith [place], [place]

## 2022-11-22 DIAGNOSIS — E782 Mixed hyperlipidemia: Secondary | ICD-10-CM | POA: Diagnosis not present

## 2022-11-22 DIAGNOSIS — S62636D Displaced fracture of distal phalanx of right little finger, subsequent encounter for fracture with routine healing: Secondary | ICD-10-CM | POA: Diagnosis not present

## 2022-11-22 DIAGNOSIS — F411 Generalized anxiety disorder: Secondary | ICD-10-CM | POA: Diagnosis not present

## 2022-11-22 DIAGNOSIS — G894 Chronic pain syndrome: Secondary | ICD-10-CM | POA: Diagnosis not present

## 2022-11-22 DIAGNOSIS — N390 Urinary tract infection, site not specified: Secondary | ICD-10-CM | POA: Diagnosis not present

## 2022-11-22 DIAGNOSIS — I480 Paroxysmal atrial fibrillation: Secondary | ICD-10-CM | POA: Diagnosis not present

## 2022-11-22 DIAGNOSIS — M5451 Vertebrogenic low back pain: Secondary | ICD-10-CM | POA: Diagnosis not present

## 2022-11-22 DIAGNOSIS — R296 Repeated falls: Secondary | ICD-10-CM | POA: Diagnosis not present

## 2022-11-22 DIAGNOSIS — I951 Orthostatic hypotension: Secondary | ICD-10-CM | POA: Diagnosis not present

## 2022-11-22 DIAGNOSIS — G9341 Metabolic encephalopathy: Secondary | ICD-10-CM | POA: Diagnosis not present

## 2022-11-22 DIAGNOSIS — I110 Hypertensive heart disease with heart failure: Secondary | ICD-10-CM | POA: Diagnosis not present

## 2022-11-22 DIAGNOSIS — R55 Syncope and collapse: Secondary | ICD-10-CM | POA: Diagnosis not present

## 2022-11-23 DIAGNOSIS — F411 Generalized anxiety disorder: Secondary | ICD-10-CM | POA: Diagnosis not present

## 2022-11-23 DIAGNOSIS — R55 Syncope and collapse: Secondary | ICD-10-CM | POA: Diagnosis not present

## 2022-11-23 DIAGNOSIS — G9341 Metabolic encephalopathy: Secondary | ICD-10-CM | POA: Diagnosis not present

## 2022-11-23 DIAGNOSIS — E782 Mixed hyperlipidemia: Secondary | ICD-10-CM | POA: Diagnosis not present

## 2022-11-23 DIAGNOSIS — S62636D Displaced fracture of distal phalanx of right little finger, subsequent encounter for fracture with routine healing: Secondary | ICD-10-CM | POA: Diagnosis not present

## 2022-11-23 DIAGNOSIS — I110 Hypertensive heart disease with heart failure: Secondary | ICD-10-CM | POA: Diagnosis not present

## 2022-11-23 DIAGNOSIS — I951 Orthostatic hypotension: Secondary | ICD-10-CM | POA: Diagnosis not present

## 2022-11-23 DIAGNOSIS — I480 Paroxysmal atrial fibrillation: Secondary | ICD-10-CM | POA: Diagnosis not present

## 2022-11-23 DIAGNOSIS — G894 Chronic pain syndrome: Secondary | ICD-10-CM | POA: Diagnosis not present

## 2022-11-23 DIAGNOSIS — K580 Irritable bowel syndrome with diarrhea: Secondary | ICD-10-CM | POA: Diagnosis not present

## 2022-11-25 DIAGNOSIS — S52522A Torus fracture of lower end of left radius, initial encounter for closed fracture: Secondary | ICD-10-CM | POA: Diagnosis not present

## 2022-11-25 LAB — NM MYOCAR MULTI W/SPECT W/WALL MOTION / EF
Estimated workload: 1
Exercise duration (min): 1 min
Exercise duration (sec): 1 s
LV dias vol: 12 mL (ref 46–106)
LV sys vol: 4 mL
MPHR: 144 {beats}/min
Nuc Stress EF: 67 %
Peak HR: 120 {beats}/min
Percent HR: 97 %
Rest HR: 103 {beats}/min
Rest Nuclear Isotope Dose: 10 mCi
SDS: 5
SRS: 3
SSS: 6
ST Depression (mm): 0 mm
Stress Nuclear Isotope Dose: 29.8 mCi
TID: 0.7

## 2022-11-26 ENCOUNTER — Other Ambulatory Visit (HOSPITAL_BASED_OUTPATIENT_CLINIC_OR_DEPARTMENT_OTHER): Payer: Self-pay | Admitting: Osteopathic Medicine

## 2022-11-26 DIAGNOSIS — I951 Orthostatic hypotension: Secondary | ICD-10-CM | POA: Diagnosis not present

## 2022-11-26 DIAGNOSIS — R110 Nausea: Secondary | ICD-10-CM | POA: Diagnosis not present

## 2022-11-26 DIAGNOSIS — G894 Chronic pain syndrome: Secondary | ICD-10-CM | POA: Diagnosis not present

## 2022-11-26 DIAGNOSIS — E782 Mixed hyperlipidemia: Secondary | ICD-10-CM | POA: Diagnosis not present

## 2022-11-26 DIAGNOSIS — K580 Irritable bowel syndrome with diarrhea: Secondary | ICD-10-CM | POA: Diagnosis not present

## 2022-11-26 DIAGNOSIS — G9341 Metabolic encephalopathy: Secondary | ICD-10-CM | POA: Diagnosis not present

## 2022-11-26 DIAGNOSIS — M5451 Vertebrogenic low back pain: Secondary | ICD-10-CM | POA: Diagnosis not present

## 2022-11-26 DIAGNOSIS — S62636D Displaced fracture of distal phalanx of right little finger, subsequent encounter for fracture with routine healing: Secondary | ICD-10-CM | POA: Diagnosis not present

## 2022-11-26 DIAGNOSIS — I480 Paroxysmal atrial fibrillation: Secondary | ICD-10-CM | POA: Diagnosis not present

## 2022-11-26 DIAGNOSIS — I110 Hypertensive heart disease with heart failure: Secondary | ICD-10-CM | POA: Diagnosis not present

## 2022-11-26 DIAGNOSIS — F411 Generalized anxiety disorder: Secondary | ICD-10-CM | POA: Diagnosis not present

## 2022-11-26 DIAGNOSIS — R55 Syncope and collapse: Secondary | ICD-10-CM | POA: Diagnosis not present

## 2022-11-29 DIAGNOSIS — E782 Mixed hyperlipidemia: Secondary | ICD-10-CM | POA: Diagnosis not present

## 2022-11-29 DIAGNOSIS — F411 Generalized anxiety disorder: Secondary | ICD-10-CM | POA: Diagnosis not present

## 2022-11-29 DIAGNOSIS — R55 Syncope and collapse: Secondary | ICD-10-CM | POA: Diagnosis not present

## 2022-11-29 DIAGNOSIS — G894 Chronic pain syndrome: Secondary | ICD-10-CM | POA: Diagnosis not present

## 2022-11-29 DIAGNOSIS — I480 Paroxysmal atrial fibrillation: Secondary | ICD-10-CM | POA: Diagnosis not present

## 2022-11-29 DIAGNOSIS — K580 Irritable bowel syndrome with diarrhea: Secondary | ICD-10-CM | POA: Diagnosis not present

## 2022-11-29 DIAGNOSIS — G9341 Metabolic encephalopathy: Secondary | ICD-10-CM | POA: Diagnosis not present

## 2022-11-29 DIAGNOSIS — I110 Hypertensive heart disease with heart failure: Secondary | ICD-10-CM | POA: Diagnosis not present

## 2022-11-29 DIAGNOSIS — S62636D Displaced fracture of distal phalanx of right little finger, subsequent encounter for fracture with routine healing: Secondary | ICD-10-CM | POA: Diagnosis not present

## 2022-11-29 DIAGNOSIS — I951 Orthostatic hypotension: Secondary | ICD-10-CM | POA: Diagnosis not present

## 2022-11-29 DIAGNOSIS — R296 Repeated falls: Secondary | ICD-10-CM | POA: Diagnosis not present

## 2022-11-29 DIAGNOSIS — M5451 Vertebrogenic low back pain: Secondary | ICD-10-CM | POA: Diagnosis not present

## 2022-12-01 DIAGNOSIS — G894 Chronic pain syndrome: Secondary | ICD-10-CM | POA: Diagnosis not present

## 2022-12-01 DIAGNOSIS — Z961 Presence of intraocular lens: Secondary | ICD-10-CM | POA: Diagnosis not present

## 2022-12-01 DIAGNOSIS — F411 Generalized anxiety disorder: Secondary | ICD-10-CM | POA: Diagnosis not present

## 2022-12-01 DIAGNOSIS — K580 Irritable bowel syndrome with diarrhea: Secondary | ICD-10-CM | POA: Diagnosis not present

## 2022-12-01 DIAGNOSIS — I5020 Unspecified systolic (congestive) heart failure: Secondary | ICD-10-CM | POA: Diagnosis not present

## 2022-12-01 DIAGNOSIS — N1832 Chronic kidney disease, stage 3b: Secondary | ICD-10-CM | POA: Diagnosis not present

## 2022-12-01 DIAGNOSIS — S62636D Displaced fracture of distal phalanx of right little finger, subsequent encounter for fracture with routine healing: Secondary | ICD-10-CM | POA: Diagnosis not present

## 2022-12-01 DIAGNOSIS — I480 Paroxysmal atrial fibrillation: Secondary | ICD-10-CM | POA: Diagnosis not present

## 2022-12-01 DIAGNOSIS — M4856XD Collapsed vertebra, not elsewhere classified, lumbar region, subsequent encounter for fracture with routine healing: Secondary | ICD-10-CM | POA: Diagnosis not present

## 2022-12-01 DIAGNOSIS — G4700 Insomnia, unspecified: Secondary | ICD-10-CM | POA: Diagnosis not present

## 2022-12-01 DIAGNOSIS — Z9181 History of falling: Secondary | ICD-10-CM | POA: Diagnosis not present

## 2022-12-01 DIAGNOSIS — I951 Orthostatic hypotension: Secondary | ICD-10-CM | POA: Diagnosis not present

## 2022-12-01 DIAGNOSIS — M797 Fibromyalgia: Secondary | ICD-10-CM | POA: Diagnosis not present

## 2022-12-01 DIAGNOSIS — E782 Mixed hyperlipidemia: Secondary | ICD-10-CM | POA: Diagnosis not present

## 2022-12-01 DIAGNOSIS — J449 Chronic obstructive pulmonary disease, unspecified: Secondary | ICD-10-CM | POA: Diagnosis not present

## 2022-12-01 DIAGNOSIS — M329 Systemic lupus erythematosus, unspecified: Secondary | ICD-10-CM | POA: Diagnosis not present

## 2022-12-01 DIAGNOSIS — F32A Depression, unspecified: Secondary | ICD-10-CM | POA: Diagnosis not present

## 2022-12-01 DIAGNOSIS — K219 Gastro-esophageal reflux disease without esophagitis: Secondary | ICD-10-CM | POA: Diagnosis not present

## 2022-12-01 DIAGNOSIS — Z87440 Personal history of urinary (tract) infections: Secondary | ICD-10-CM | POA: Diagnosis not present

## 2022-12-01 DIAGNOSIS — I130 Hypertensive heart and chronic kidney disease with heart failure and stage 1 through stage 4 chronic kidney disease, or unspecified chronic kidney disease: Secondary | ICD-10-CM | POA: Diagnosis not present

## 2022-12-01 DIAGNOSIS — Z7901 Long term (current) use of anticoagulants: Secondary | ICD-10-CM | POA: Diagnosis not present

## 2022-12-03 DIAGNOSIS — J301 Allergic rhinitis due to pollen: Secondary | ICD-10-CM | POA: Diagnosis not present

## 2022-12-03 DIAGNOSIS — K219 Gastro-esophageal reflux disease without esophagitis: Secondary | ICD-10-CM | POA: Diagnosis not present

## 2022-12-03 DIAGNOSIS — M7061 Trochanteric bursitis, right hip: Secondary | ICD-10-CM | POA: Diagnosis not present

## 2022-12-03 DIAGNOSIS — E785 Hyperlipidemia, unspecified: Secondary | ICD-10-CM | POA: Diagnosis not present

## 2022-12-03 DIAGNOSIS — I10 Essential (primary) hypertension: Secondary | ICD-10-CM | POA: Diagnosis not present

## 2022-12-03 DIAGNOSIS — I480 Paroxysmal atrial fibrillation: Secondary | ICD-10-CM | POA: Diagnosis not present

## 2022-12-03 DIAGNOSIS — N189 Chronic kidney disease, unspecified: Secondary | ICD-10-CM | POA: Diagnosis not present

## 2022-12-03 DIAGNOSIS — G8929 Other chronic pain: Secondary | ICD-10-CM | POA: Diagnosis not present

## 2022-12-03 DIAGNOSIS — R410 Disorientation, unspecified: Secondary | ICD-10-CM | POA: Diagnosis not present

## 2022-12-03 DIAGNOSIS — S62306D Unspecified fracture of fifth metacarpal bone, right hand, subsequent encounter for fracture with routine healing: Secondary | ICD-10-CM | POA: Diagnosis not present

## 2022-12-03 DIAGNOSIS — F411 Generalized anxiety disorder: Secondary | ICD-10-CM | POA: Diagnosis not present

## 2022-12-03 DIAGNOSIS — G4709 Other insomnia: Secondary | ICD-10-CM | POA: Diagnosis not present

## 2022-12-11 DIAGNOSIS — I129 Hypertensive chronic kidney disease with stage 1 through stage 4 chronic kidney disease, or unspecified chronic kidney disease: Secondary | ICD-10-CM | POA: Diagnosis not present

## 2022-12-11 DIAGNOSIS — E785 Hyperlipidemia, unspecified: Secondary | ICD-10-CM | POA: Diagnosis not present

## 2022-12-11 DIAGNOSIS — N189 Chronic kidney disease, unspecified: Secondary | ICD-10-CM | POA: Diagnosis not present

## 2022-12-14 DIAGNOSIS — Z9181 History of falling: Secondary | ICD-10-CM | POA: Diagnosis not present

## 2022-12-14 DIAGNOSIS — I951 Orthostatic hypotension: Secondary | ICD-10-CM | POA: Diagnosis not present

## 2022-12-14 DIAGNOSIS — K580 Irritable bowel syndrome with diarrhea: Secondary | ICD-10-CM | POA: Diagnosis not present

## 2022-12-14 DIAGNOSIS — J449 Chronic obstructive pulmonary disease, unspecified: Secondary | ICD-10-CM | POA: Diagnosis not present

## 2022-12-14 DIAGNOSIS — M329 Systemic lupus erythematosus, unspecified: Secondary | ICD-10-CM | POA: Diagnosis not present

## 2022-12-14 DIAGNOSIS — F411 Generalized anxiety disorder: Secondary | ICD-10-CM | POA: Diagnosis not present

## 2022-12-14 DIAGNOSIS — M4856XD Collapsed vertebra, not elsewhere classified, lumbar region, subsequent encounter for fracture with routine healing: Secondary | ICD-10-CM | POA: Diagnosis not present

## 2022-12-14 DIAGNOSIS — I480 Paroxysmal atrial fibrillation: Secondary | ICD-10-CM | POA: Diagnosis not present

## 2022-12-14 DIAGNOSIS — M797 Fibromyalgia: Secondary | ICD-10-CM | POA: Diagnosis not present

## 2022-12-14 DIAGNOSIS — Z961 Presence of intraocular lens: Secondary | ICD-10-CM | POA: Diagnosis not present

## 2022-12-14 DIAGNOSIS — K219 Gastro-esophageal reflux disease without esophagitis: Secondary | ICD-10-CM | POA: Diagnosis not present

## 2022-12-14 DIAGNOSIS — Z87440 Personal history of urinary (tract) infections: Secondary | ICD-10-CM | POA: Diagnosis not present

## 2022-12-14 DIAGNOSIS — G894 Chronic pain syndrome: Secondary | ICD-10-CM | POA: Diagnosis not present

## 2022-12-14 DIAGNOSIS — G4700 Insomnia, unspecified: Secondary | ICD-10-CM | POA: Diagnosis not present

## 2022-12-14 DIAGNOSIS — I130 Hypertensive heart and chronic kidney disease with heart failure and stage 1 through stage 4 chronic kidney disease, or unspecified chronic kidney disease: Secondary | ICD-10-CM | POA: Diagnosis not present

## 2022-12-14 DIAGNOSIS — N1832 Chronic kidney disease, stage 3b: Secondary | ICD-10-CM | POA: Diagnosis not present

## 2022-12-14 DIAGNOSIS — S62636D Displaced fracture of distal phalanx of right little finger, subsequent encounter for fracture with routine healing: Secondary | ICD-10-CM | POA: Diagnosis not present

## 2022-12-14 DIAGNOSIS — Z7901 Long term (current) use of anticoagulants: Secondary | ICD-10-CM | POA: Diagnosis not present

## 2022-12-14 DIAGNOSIS — I5020 Unspecified systolic (congestive) heart failure: Secondary | ICD-10-CM | POA: Diagnosis not present

## 2022-12-14 DIAGNOSIS — F32A Depression, unspecified: Secondary | ICD-10-CM | POA: Diagnosis not present

## 2022-12-14 DIAGNOSIS — E782 Mixed hyperlipidemia: Secondary | ICD-10-CM | POA: Diagnosis not present

## 2022-12-17 DIAGNOSIS — I951 Orthostatic hypotension: Secondary | ICD-10-CM | POA: Diagnosis not present

## 2022-12-17 DIAGNOSIS — S62636D Displaced fracture of distal phalanx of right little finger, subsequent encounter for fracture with routine healing: Secondary | ICD-10-CM | POA: Diagnosis not present

## 2022-12-17 DIAGNOSIS — M329 Systemic lupus erythematosus, unspecified: Secondary | ICD-10-CM | POA: Diagnosis not present

## 2022-12-17 DIAGNOSIS — Z961 Presence of intraocular lens: Secondary | ICD-10-CM | POA: Diagnosis not present

## 2022-12-17 DIAGNOSIS — Z9181 History of falling: Secondary | ICD-10-CM | POA: Diagnosis not present

## 2022-12-17 DIAGNOSIS — F32A Depression, unspecified: Secondary | ICD-10-CM | POA: Diagnosis not present

## 2022-12-17 DIAGNOSIS — I5020 Unspecified systolic (congestive) heart failure: Secondary | ICD-10-CM | POA: Diagnosis not present

## 2022-12-17 DIAGNOSIS — F411 Generalized anxiety disorder: Secondary | ICD-10-CM | POA: Diagnosis not present

## 2022-12-17 DIAGNOSIS — G894 Chronic pain syndrome: Secondary | ICD-10-CM | POA: Diagnosis not present

## 2022-12-17 DIAGNOSIS — Z7901 Long term (current) use of anticoagulants: Secondary | ICD-10-CM | POA: Diagnosis not present

## 2022-12-17 DIAGNOSIS — Z87440 Personal history of urinary (tract) infections: Secondary | ICD-10-CM | POA: Diagnosis not present

## 2022-12-17 DIAGNOSIS — M797 Fibromyalgia: Secondary | ICD-10-CM | POA: Diagnosis not present

## 2022-12-17 DIAGNOSIS — J449 Chronic obstructive pulmonary disease, unspecified: Secondary | ICD-10-CM | POA: Diagnosis not present

## 2022-12-17 DIAGNOSIS — I130 Hypertensive heart and chronic kidney disease with heart failure and stage 1 through stage 4 chronic kidney disease, or unspecified chronic kidney disease: Secondary | ICD-10-CM | POA: Diagnosis not present

## 2022-12-17 DIAGNOSIS — N1832 Chronic kidney disease, stage 3b: Secondary | ICD-10-CM | POA: Diagnosis not present

## 2022-12-17 DIAGNOSIS — M4856XD Collapsed vertebra, not elsewhere classified, lumbar region, subsequent encounter for fracture with routine healing: Secondary | ICD-10-CM | POA: Diagnosis not present

## 2022-12-17 DIAGNOSIS — K580 Irritable bowel syndrome with diarrhea: Secondary | ICD-10-CM | POA: Diagnosis not present

## 2022-12-17 DIAGNOSIS — E782 Mixed hyperlipidemia: Secondary | ICD-10-CM | POA: Diagnosis not present

## 2022-12-17 DIAGNOSIS — I480 Paroxysmal atrial fibrillation: Secondary | ICD-10-CM | POA: Diagnosis not present

## 2022-12-17 DIAGNOSIS — G4700 Insomnia, unspecified: Secondary | ICD-10-CM | POA: Diagnosis not present

## 2022-12-17 DIAGNOSIS — K219 Gastro-esophageal reflux disease without esophagitis: Secondary | ICD-10-CM | POA: Diagnosis not present

## 2022-12-21 DIAGNOSIS — K219 Gastro-esophageal reflux disease without esophagitis: Secondary | ICD-10-CM | POA: Diagnosis not present

## 2022-12-21 DIAGNOSIS — M797 Fibromyalgia: Secondary | ICD-10-CM | POA: Diagnosis not present

## 2022-12-21 DIAGNOSIS — M4856XD Collapsed vertebra, not elsewhere classified, lumbar region, subsequent encounter for fracture with routine healing: Secondary | ICD-10-CM | POA: Diagnosis not present

## 2022-12-21 DIAGNOSIS — G894 Chronic pain syndrome: Secondary | ICD-10-CM | POA: Diagnosis not present

## 2022-12-21 DIAGNOSIS — M329 Systemic lupus erythematosus, unspecified: Secondary | ICD-10-CM | POA: Diagnosis not present

## 2022-12-21 DIAGNOSIS — I480 Paroxysmal atrial fibrillation: Secondary | ICD-10-CM | POA: Diagnosis not present

## 2022-12-21 DIAGNOSIS — I5020 Unspecified systolic (congestive) heart failure: Secondary | ICD-10-CM | POA: Diagnosis not present

## 2022-12-21 DIAGNOSIS — Z7901 Long term (current) use of anticoagulants: Secondary | ICD-10-CM | POA: Diagnosis not present

## 2022-12-21 DIAGNOSIS — S62636D Displaced fracture of distal phalanx of right little finger, subsequent encounter for fracture with routine healing: Secondary | ICD-10-CM | POA: Diagnosis not present

## 2022-12-21 DIAGNOSIS — Z87440 Personal history of urinary (tract) infections: Secondary | ICD-10-CM | POA: Diagnosis not present

## 2022-12-21 DIAGNOSIS — Z9181 History of falling: Secondary | ICD-10-CM | POA: Diagnosis not present

## 2022-12-21 DIAGNOSIS — J449 Chronic obstructive pulmonary disease, unspecified: Secondary | ICD-10-CM | POA: Diagnosis not present

## 2022-12-21 DIAGNOSIS — I130 Hypertensive heart and chronic kidney disease with heart failure and stage 1 through stage 4 chronic kidney disease, or unspecified chronic kidney disease: Secondary | ICD-10-CM | POA: Diagnosis not present

## 2022-12-21 DIAGNOSIS — Z961 Presence of intraocular lens: Secondary | ICD-10-CM | POA: Diagnosis not present

## 2022-12-21 DIAGNOSIS — N1832 Chronic kidney disease, stage 3b: Secondary | ICD-10-CM | POA: Diagnosis not present

## 2022-12-21 DIAGNOSIS — K580 Irritable bowel syndrome with diarrhea: Secondary | ICD-10-CM | POA: Diagnosis not present

## 2022-12-21 DIAGNOSIS — F411 Generalized anxiety disorder: Secondary | ICD-10-CM | POA: Diagnosis not present

## 2022-12-21 DIAGNOSIS — I951 Orthostatic hypotension: Secondary | ICD-10-CM | POA: Diagnosis not present

## 2022-12-21 DIAGNOSIS — E782 Mixed hyperlipidemia: Secondary | ICD-10-CM | POA: Diagnosis not present

## 2022-12-21 DIAGNOSIS — F32A Depression, unspecified: Secondary | ICD-10-CM | POA: Diagnosis not present

## 2022-12-21 DIAGNOSIS — G4700 Insomnia, unspecified: Secondary | ICD-10-CM | POA: Diagnosis not present

## 2022-12-22 ENCOUNTER — Ambulatory Visit (INDEPENDENT_AMBULATORY_CARE_PROVIDER_SITE_OTHER): Payer: PPO | Admitting: Nurse Practitioner

## 2022-12-23 DIAGNOSIS — I480 Paroxysmal atrial fibrillation: Secondary | ICD-10-CM | POA: Diagnosis not present

## 2022-12-23 DIAGNOSIS — R002 Palpitations: Secondary | ICD-10-CM | POA: Diagnosis not present

## 2022-12-23 DIAGNOSIS — R0602 Shortness of breath: Secondary | ICD-10-CM | POA: Diagnosis not present

## 2022-12-23 DIAGNOSIS — R296 Repeated falls: Secondary | ICD-10-CM | POA: Diagnosis not present

## 2022-12-23 DIAGNOSIS — I10 Essential (primary) hypertension: Secondary | ICD-10-CM | POA: Diagnosis not present

## 2022-12-23 DIAGNOSIS — Z9889 Other specified postprocedural states: Secondary | ICD-10-CM | POA: Diagnosis not present

## 2022-12-23 DIAGNOSIS — I5031 Acute diastolic (congestive) heart failure: Secondary | ICD-10-CM | POA: Diagnosis not present

## 2022-12-23 DIAGNOSIS — I872 Venous insufficiency (chronic) (peripheral): Secondary | ICD-10-CM | POA: Diagnosis not present

## 2022-12-23 DIAGNOSIS — Z23 Encounter for immunization: Secondary | ICD-10-CM | POA: Diagnosis not present

## 2022-12-24 DIAGNOSIS — I951 Orthostatic hypotension: Secondary | ICD-10-CM | POA: Diagnosis not present

## 2022-12-24 DIAGNOSIS — I130 Hypertensive heart and chronic kidney disease with heart failure and stage 1 through stage 4 chronic kidney disease, or unspecified chronic kidney disease: Secondary | ICD-10-CM | POA: Diagnosis not present

## 2022-12-24 DIAGNOSIS — J449 Chronic obstructive pulmonary disease, unspecified: Secondary | ICD-10-CM | POA: Diagnosis not present

## 2022-12-24 DIAGNOSIS — K580 Irritable bowel syndrome with diarrhea: Secondary | ICD-10-CM | POA: Diagnosis not present

## 2022-12-24 DIAGNOSIS — Z7901 Long term (current) use of anticoagulants: Secondary | ICD-10-CM | POA: Diagnosis not present

## 2022-12-24 DIAGNOSIS — Z9181 History of falling: Secondary | ICD-10-CM | POA: Diagnosis not present

## 2022-12-24 DIAGNOSIS — G894 Chronic pain syndrome: Secondary | ICD-10-CM | POA: Diagnosis not present

## 2022-12-24 DIAGNOSIS — Z961 Presence of intraocular lens: Secondary | ICD-10-CM | POA: Diagnosis not present

## 2022-12-24 DIAGNOSIS — M4856XD Collapsed vertebra, not elsewhere classified, lumbar region, subsequent encounter for fracture with routine healing: Secondary | ICD-10-CM | POA: Diagnosis not present

## 2022-12-24 DIAGNOSIS — K219 Gastro-esophageal reflux disease without esophagitis: Secondary | ICD-10-CM | POA: Diagnosis not present

## 2022-12-24 DIAGNOSIS — N1832 Chronic kidney disease, stage 3b: Secondary | ICD-10-CM | POA: Diagnosis not present

## 2022-12-24 DIAGNOSIS — F411 Generalized anxiety disorder: Secondary | ICD-10-CM | POA: Diagnosis not present

## 2022-12-24 DIAGNOSIS — I5020 Unspecified systolic (congestive) heart failure: Secondary | ICD-10-CM | POA: Diagnosis not present

## 2022-12-24 DIAGNOSIS — S62636D Displaced fracture of distal phalanx of right little finger, subsequent encounter for fracture with routine healing: Secondary | ICD-10-CM | POA: Diagnosis not present

## 2022-12-24 DIAGNOSIS — E782 Mixed hyperlipidemia: Secondary | ICD-10-CM | POA: Diagnosis not present

## 2022-12-24 DIAGNOSIS — G4700 Insomnia, unspecified: Secondary | ICD-10-CM | POA: Diagnosis not present

## 2022-12-24 DIAGNOSIS — M329 Systemic lupus erythematosus, unspecified: Secondary | ICD-10-CM | POA: Diagnosis not present

## 2022-12-24 DIAGNOSIS — Z87440 Personal history of urinary (tract) infections: Secondary | ICD-10-CM | POA: Diagnosis not present

## 2022-12-24 DIAGNOSIS — I480 Paroxysmal atrial fibrillation: Secondary | ICD-10-CM | POA: Diagnosis not present

## 2022-12-24 DIAGNOSIS — M797 Fibromyalgia: Secondary | ICD-10-CM | POA: Diagnosis not present

## 2022-12-24 DIAGNOSIS — F32A Depression, unspecified: Secondary | ICD-10-CM | POA: Diagnosis not present

## 2022-12-27 ENCOUNTER — Ambulatory Visit: Payer: PPO | Admitting: Internal Medicine

## 2022-12-27 ENCOUNTER — Encounter: Payer: Self-pay | Admitting: Internal Medicine

## 2022-12-27 ENCOUNTER — Ambulatory Visit (INDEPENDENT_AMBULATORY_CARE_PROVIDER_SITE_OTHER): Payer: PPO | Admitting: Internal Medicine

## 2022-12-27 VITALS — BP 122/68 | HR 89 | Resp 18 | Ht 67.0 in | Wt 108.8 lb

## 2022-12-27 DIAGNOSIS — I509 Heart failure, unspecified: Secondary | ICD-10-CM

## 2022-12-27 DIAGNOSIS — I480 Paroxysmal atrial fibrillation: Secondary | ICD-10-CM

## 2022-12-27 DIAGNOSIS — Z23 Encounter for immunization: Secondary | ICD-10-CM

## 2022-12-27 DIAGNOSIS — I951 Orthostatic hypotension: Secondary | ICD-10-CM

## 2022-12-27 DIAGNOSIS — N1831 Chronic kidney disease, stage 3a: Secondary | ICD-10-CM | POA: Diagnosis not present

## 2022-12-27 DIAGNOSIS — G4700 Insomnia, unspecified: Secondary | ICD-10-CM | POA: Diagnosis not present

## 2022-12-27 DIAGNOSIS — F411 Generalized anxiety disorder: Secondary | ICD-10-CM | POA: Diagnosis not present

## 2022-12-27 DIAGNOSIS — I872 Venous insufficiency (chronic) (peripheral): Secondary | ICD-10-CM | POA: Diagnosis not present

## 2022-12-27 NOTE — Progress Notes (Signed)
New Patient Office Visit  Subjective    Patient ID: Donna Evans, female    DOB: [birth year]  Age: 77 y.o. MRN: [id number]  CC:  Chief Complaint  Patient presents with   Establish Care    See's specialist: cardio, vascular    HPI Donna Evans presents to establish care. She is here with her nephew and niece.   A.Fib/CHF/Hypotension: -Medications: Amiodarone 200 mg, Eliquis 5 mg BID, Metoprolol 50 mg TID, Midodrine 2.5 mg -Patient is compliant with above medications and reports no side effects. -Checking BP at home (average): 132/82, randomly low at 100-90/40-60 -Denies any SOB, chest pain but does have dizzy spells, pre-syncope and increased confusion -Following with Cardiology, last seen 12/23/22 -Last echo 4/23 showing EF 55% -Patient does ambulate with a cane outside the home and a walker inside the home. Patient does live alone.   HLD: -Medications: Nothing currently - had been on Crestor  -Last lipid panel: Lipid Panel     Component Value Date/Time   CHOL 111 04/29/2022 0504   TRIG 114 04/29/2022 0504   HDL 42 04/29/2022 0504   CHOLHDL 2.6 04/29/2022 0504   VLDL 23 04/29/2022 0504   LDLCALC 46 04/29/2022 0504   Venous Insufficiency/PAD: -Following with Vascular, last seen 07/07/2022 with abnormal ABIs at the time.  CKD3a/AoCD: -Creatinine 1.21, GFR 46 11/17/22, Hgb 11.5 -Following with Nephrology, last seen 08/23/22  Anxiety/Insomnia:  -Currently on Xanax 1 mg, Ambien 10 mg and muscle relaxers at night to help her sleep. -Started the Xanax in 02-25-2004 when her son passed away and has been on it since   Outpatient Encounter Medications as of 12/27/2022  Medication Sig   ALPRAZolam (XANAX) 1 MG tablet Take 1 mg by mouth at bedtime.   alum & mag hydroxide-simeth (MAALOX/MYLANTA) 200-200-20 MG/5ML suspension Take 15 mLs by mouth every 6 (six) hours as needed for indigestion or heartburn.   amiodarone (PACERONE) 200 MG tablet Take 1 tablet (200 mg total) by mouth 2  (two) times daily for 12 days, THEN 1 tablet (200 mg total) daily. (Patient taking differently: Take 1 tablet daily)   apixaban (ELIQUIS) 5 MG TABS tablet Take 1 tablet (5 mg total) by mouth 2 (two) times daily.   butorphanol (STADOL) 10 MG/ML nasal spray Place 1 spray into the nose every 4 (four) hours as needed.   calcium carbonate (TUMS - DOSED IN MG ELEMENTAL CALCIUM) 500 MG chewable tablet Chew 1 tablet by mouth 3 (three) times daily as needed for indigestion or heartburn.   cyclobenzaprine (FEXMID) 7.5 MG tablet Take 1 tablet (7.5 mg total) by mouth 3 (three) times daily as needed for muscle spasms.   folic acid (FOLVITE) 1 MG tablet Take 1 tablet (1 mg total) by mouth daily.   lidocaine (LIDODERM) 5 % Place 1 patch onto the skin daily. Remove & Discard patch within 12 hours or as directed by MD. Apply to back.   metoprolol tartrate (LOPRESSOR) 50 MG tablet Take 1 tablet (50 mg total) by mouth 3 (three) times daily. (Patient taking differently: Take 25 mg by mouth 2 (two) times daily.)   midodrine (PROAMATINE) 2.5 MG tablet Take 1 tablet (2.5 mg total) by mouth 3 (three) times daily with meals. (Patient taking differently: Take 2.5 mg by mouth daily.)   Vitamin D, Ergocalciferol, 50000 units CAPS Take 1 capsule by mouth once a week.   zolpidem (AMBIEN) 10 MG tablet Take 0.5 tablets (5 mg total) by mouth at bedtime.   [  DISCONTINUED] Cyanocobalamin (VITAMIN B-12 IJ) Inject 1,000 mg as directed every 30 (thirty) days.   [DISCONTINUED] feeding supplement (ENSURE ENLIVE / ENSURE PLUS) LIQD Take 237 mLs by mouth 2 (two) times daily between meals.   [DISCONTINUED] HYDROcodone-acetaminophen (NORCO/VICODIN) 5-325 MG tablet Take 1-2 tablets by mouth every 4 (four) hours as needed for moderate pain or severe pain.   [DISCONTINUED] Multiple Vitamin (MULTIVITAMIN WITH MINERALS) TABS tablet Take 1 tablet by mouth daily.   [DISCONTINUED] rosuvastatin (CRESTOR) 10 MG tablet Take 10 mg by mouth daily.   No  facility-administered encounter medications on file as of 12/27/2022.    Past Medical History:  Diagnosis Date   Allergic state    Anginal pain ([place])    Prinzmetal's angina   Anxiety    Arthritis    osteoarthritis   Breast cancer ([place]) 1990   right breast cancer   Chronic pain    Chronic pain    COPD (chronic obstructive pulmonary disease) (HCC)    Depression    Edema    Fibromyalgia    Foot fracture    Bilateral   GERD (gastroesophageal reflux disease)    History of kidney stones    Hypertension    Leg fracture, right    Low back pain    Lumbosacral neuritis    Medulloadrenal hyperfunction (HCC)    Migraine headache    Peripheral neuropathy    Shoulder fracture, right    Stroke [facility])    TIA   Systemic lupus erythematosus ([place])    Thyroid disease    TIA (transient ischemic attack) 11/28/2013   Transient global amnesia 2011    Past Surgical History:  Procedure Laterality Date   ABDOMINAL HYSTERECTOMY  1981   APPENDECTOMY  1957   AUGMENTATION MAMMAPLASTY Bilateral 1990   saline submuscular   BACK SURGERY  1989   BILATERAL TOTAL MASTECTOMY WITH AXILLARY LYMPH NODE DISSECTION  1990   BREAST IMPLANT EXCHANGE Bilateral 05/11/2016   Procedure: REMOVAL AND REPLACEMENT OF BREAST IMPLANTS ;  Surgeon: Irene Limbo, MD;  Location: [facility];  Service: Plastics;  Laterality: Bilateral;   CAPSULECTOMY Bilateral 05/11/2016   Procedure: CAPSULECTOMY CAPSULORRAPHY ;  Surgeon: Irene Limbo, MD;  Location: [place];  Service: Plastics;  Laterality: Bilateral;   CATARACT EXTRACTION W/PHACO Right 12/26/2018   Procedure: CATARACT EXTRACTION PHACO AND INTRAOCULAR LENS PLACEMENT ([place]) RIGHT;  Surgeon: Birder Robson, MD;  Location: [facility];  Service: Ophthalmology;  Laterality: Right;  [place]  00:46 CDE 8.50 Fluid pack lot # [id number]   CATARACT EXTRACTION W/PHACO Left 01/23/2019   Procedure: CATARACT EXTRACTION PHACO AND INTRAOCULAR LENS PLACEMENT  ([place]) LEFT;  Surgeon: Birder Robson, MD;  Location: [facility];  Service: Ophthalmology;  Laterality: Left;  [place]  00:45 CDE  8.41 fluid pack lot # [id number]   CHOLECYSTECTOMY  1979   COLONOSCOPY WITH PROPOFOL N/A 01/25/2018   Procedure: COLONOSCOPY WITH PROPOFOL;  Surgeon: Manya Silvas, MD;  Location: [facility];  Service: Endoscopy;  Laterality: N/A;   COPD     ESOPHAGOGASTRODUODENOSCOPY (EGD) WITH PROPOFOL N/A 03/07/2018   Procedure: ESOPHAGOGASTRODUODENOSCOPY (EGD) WITH PROPOFOL;  Surgeon: Manya Silvas, MD;  Location: [facility] ENDOSCOPY;  Service: Endoscopy;  Laterality: N/A;   FRACTURE SURGERY     HARDWARE REMOVAL Left 06/18/2020   Procedure: HARDWARE REMOVAL;  Surgeon: Lovell Sheehan, MD;  Location: [facility];  Service: Orthopedics;  Laterality: Left;   HUMERUS IM NAIL Left 12/31/2019   Procedure: INTRAMEDULLARY (IM) NAIL HUMERAL;  Surgeon:  Lovell Sheehan, MD;  Location: [facility];  Service: Orthopedics;  Laterality: Left;   LIPOSUCTION Right 05/11/2016   Procedure: LIPOSUCTION;  Surgeon: Irene Limbo, MD;  Location: [place];  Service: Plastics;  Laterality: Right;   LIVER BIOPSY  2011   MASTECTOMY SUBCUTANEOUS Bilateral 1990   MASTOPEXY Bilateral 05/11/2016   Procedure: MASTOPEXY BILATERAL ;  Surgeon: Irene Limbo, MD;  Location: [place];  Service: Plastics;  Laterality: Bilateral;    Family History  Problem Relation Age of Onset   Atrial fibrillation Mother    Atrial fibrillation Sister    Cancer Sister    Diabetes Sister    Breast cancer Sister 16   Diabetes Father    Cancer Sister    Diabetes Sister    Atrial fibrillation Sister    Kidney disease Maternal Aunt     Social History   Socioeconomic History   Marital status: Widowed    Spouse name: Not on file   Number of children: 1   Years of education: college3   Highest education level: Not on file  Occupational History   Occupation: Retired  Tobacco Use   Smoking  status: Former    Packs/day: 1.00    Years: 40.00    Total pack years: 40.00    Types: Cigarettes    Quit date: 08/28/2005    Years since quitting: 17.3   Smokeless tobacco: Never  Vaping Use   Vaping Use: Never used  Substance and Sexual Activity   Alcohol use: Not Currently   Drug use: No   Sexual activity: Never  Other Topics Concern   Not on file  Social History Narrative   Not on file   Social Determinants of Health   Financial Resource Strain: Not on file  Food Insecurity: No Food Insecurity (11/10/2022)   Hunger Vital Sign    Worried About Running Out of Food in the Last Year: Never true    [place] in the Last Year: Never true  Transportation Needs: No Transportation Needs (11/10/2022)   PRAPARE - Hydrologist (Medical): No    Lack of Transportation (Non-Medical): No  Physical Activity: Not on file  Stress: Not on file  Social Connections: Not on file  Intimate Partner Violence: Not At Risk (11/10/2022)   Humiliation, Afraid, Rape, and Kick questionnaire    Fear of Current or Ex-Partner: No    Emotionally Abused: No    Physically Abused: No    Sexually Abused: No    Review of Systems  Constitutional:  Positive for malaise/fatigue. Negative for chills and fever.  Respiratory:  Negative for shortness of breath.   Cardiovascular:  Negative for chest pain.  Neurological:  Positive for dizziness and loss of consciousness.  Psychiatric/Behavioral:  The patient is nervous/anxious and has insomnia.         Objective    BP 122/68   Pulse 89   Resp 18   Ht '5\' 7"'$  (1.702 m)   Wt 108 lb 12.8 oz (49.4 kg)   SpO2 98%   BMI 17.04 kg/m   Physical Exam Constitutional:      Appearance: Normal appearance.     Comments: Walking with a walker  HENT:     Head: Normocephalic and atraumatic.  Eyes:     Conjunctiva/sclera: Conjunctivae normal.  Cardiovascular:     Rate and Rhythm: Normal rate. Rhythm irregular.  Pulmonary:      Effort: Pulmonary effort is  normal.     Breath sounds: Normal breath sounds.  Musculoskeletal:        General: Swelling present.     Comments: Swelling in BLE  Skin:    General: Skin is dry.     Comments: Cool, dry and darkly discolored lower extremities   Neurological:     General: No focal deficit present.     Mental Status: She is alert. Mental status is at baseline.  Psychiatric:        Mood and Affect: Mood normal.        Behavior: Behavior normal.         Assessment & Plan:   1. Paroxysmal atrial fibrillation with RVR (HCC)/Chronic congestive heart failure, unspecified heart failure type [facility]): All blood pressure medications have been discontinued for the patient while she was in the hospital due to orthostatic hypotension.  Blood pressure appropriate here today.  She is currently on amiodarone 200 mg, metoprolol 50 mg 3 times daily and Eliquis 5 mg twice daily for her atrial fibrillation.  Last echo reviewed from April 2023, showing an EF of 55%.  She does follow with cardiology, note reviewed from 12/23/2022.  If she ends up establishing care here, will place a CCM referral to pharmacy to help with cost of Eliquis.  2. Orthostatic hypotension: Discussed with the patient she needs to stand and sit up slowly and take her time while walking.  She does have a small dog at home but no other environmental risk factors for falling.  She does ambulate with a walker at home and a cane outside of her home.  She is on midodrine per cardiology 2.5 mg.  3. Venous insufficiency: Following with vascular surgery, note reviewed from 07/07/2022.  She did have abnormal ABIs at that office visit and has a follow-up appointment in the next couple weeks to discuss.  She is on Eliquis.  4. Stage 3a chronic kidney disease ([place]): Following with nephrology, labs and note reviewed from 11/17/2022.  Discussed continuing to monitor and avoiding all nephrotoxic agents including anti-inflammatories.  5.  Generalized anxiety disorder/ Insomnia, unspecified type: I had a long conversation with the patient that if she establishes care here, I will not continue her Xanax or Ambien.  I would either have her see psychiatry or work on a weaning plan to get her off the substances.  I do think her taking 1 mg of Xanax, Ambien and muscle relaxers at night are contributing to her balance issues, dizziness and confusion/memory loss.  6. Need for influenza vaccination: Flu vaccine administered today.  - Flu Vaccine QUAD High Dose(Fluad)   Return if symptoms worsen or fail to improve.  - once she decides if she wants to establish care here.   Teodora Medici, DO

## 2023-01-03 ENCOUNTER — Ambulatory Visit (INDEPENDENT_AMBULATORY_CARE_PROVIDER_SITE_OTHER): Payer: PPO | Admitting: Nurse Practitioner

## 2023-01-06 DIAGNOSIS — G894 Chronic pain syndrome: Secondary | ICD-10-CM | POA: Diagnosis not present

## 2023-01-06 DIAGNOSIS — K219 Gastro-esophageal reflux disease without esophagitis: Secondary | ICD-10-CM | POA: Diagnosis not present

## 2023-01-06 DIAGNOSIS — M4856XD Collapsed vertebra, not elsewhere classified, lumbar region, subsequent encounter for fracture with routine healing: Secondary | ICD-10-CM | POA: Diagnosis not present

## 2023-01-06 DIAGNOSIS — J449 Chronic obstructive pulmonary disease, unspecified: Secondary | ICD-10-CM | POA: Diagnosis not present

## 2023-01-06 DIAGNOSIS — Z9181 History of falling: Secondary | ICD-10-CM | POA: Diagnosis not present

## 2023-01-06 DIAGNOSIS — E782 Mixed hyperlipidemia: Secondary | ICD-10-CM | POA: Diagnosis not present

## 2023-01-06 DIAGNOSIS — I951 Orthostatic hypotension: Secondary | ICD-10-CM | POA: Diagnosis not present

## 2023-01-06 DIAGNOSIS — I130 Hypertensive heart and chronic kidney disease with heart failure and stage 1 through stage 4 chronic kidney disease, or unspecified chronic kidney disease: Secondary | ICD-10-CM | POA: Diagnosis not present

## 2023-01-06 DIAGNOSIS — M329 Systemic lupus erythematosus, unspecified: Secondary | ICD-10-CM | POA: Diagnosis not present

## 2023-01-06 DIAGNOSIS — F411 Generalized anxiety disorder: Secondary | ICD-10-CM | POA: Diagnosis not present

## 2023-01-06 DIAGNOSIS — F32A Depression, unspecified: Secondary | ICD-10-CM | POA: Diagnosis not present

## 2023-01-06 DIAGNOSIS — N1832 Chronic kidney disease, stage 3b: Secondary | ICD-10-CM | POA: Diagnosis not present

## 2023-01-06 DIAGNOSIS — I5020 Unspecified systolic (congestive) heart failure: Secondary | ICD-10-CM | POA: Diagnosis not present

## 2023-01-06 DIAGNOSIS — G4700 Insomnia, unspecified: Secondary | ICD-10-CM | POA: Diagnosis not present

## 2023-01-06 DIAGNOSIS — S62636D Displaced fracture of distal phalanx of right little finger, subsequent encounter for fracture with routine healing: Secondary | ICD-10-CM | POA: Diagnosis not present

## 2023-01-06 DIAGNOSIS — K580 Irritable bowel syndrome with diarrhea: Secondary | ICD-10-CM | POA: Diagnosis not present

## 2023-01-06 DIAGNOSIS — I480 Paroxysmal atrial fibrillation: Secondary | ICD-10-CM | POA: Diagnosis not present

## 2023-01-06 DIAGNOSIS — M797 Fibromyalgia: Secondary | ICD-10-CM | POA: Diagnosis not present

## 2023-01-06 DIAGNOSIS — Z961 Presence of intraocular lens: Secondary | ICD-10-CM | POA: Diagnosis not present

## 2023-01-06 DIAGNOSIS — Z7901 Long term (current) use of anticoagulants: Secondary | ICD-10-CM | POA: Diagnosis not present

## 2023-01-06 DIAGNOSIS — Z87440 Personal history of urinary (tract) infections: Secondary | ICD-10-CM | POA: Diagnosis not present

## 2023-01-11 DIAGNOSIS — E785 Hyperlipidemia, unspecified: Secondary | ICD-10-CM

## 2023-01-11 DIAGNOSIS — N189 Chronic kidney disease, unspecified: Secondary | ICD-10-CM

## 2023-01-11 DIAGNOSIS — I129 Hypertensive chronic kidney disease with stage 1 through stage 4 chronic kidney disease, or unspecified chronic kidney disease: Secondary | ICD-10-CM

## 2023-01-17 DIAGNOSIS — I130 Hypertensive heart and chronic kidney disease with heart failure and stage 1 through stage 4 chronic kidney disease, or unspecified chronic kidney disease: Secondary | ICD-10-CM | POA: Diagnosis not present

## 2023-01-17 DIAGNOSIS — Z961 Presence of intraocular lens: Secondary | ICD-10-CM | POA: Diagnosis not present

## 2023-01-17 DIAGNOSIS — M329 Systemic lupus erythematosus, unspecified: Secondary | ICD-10-CM | POA: Diagnosis not present

## 2023-01-17 DIAGNOSIS — K580 Irritable bowel syndrome with diarrhea: Secondary | ICD-10-CM | POA: Diagnosis not present

## 2023-01-17 DIAGNOSIS — J449 Chronic obstructive pulmonary disease, unspecified: Secondary | ICD-10-CM | POA: Diagnosis not present

## 2023-01-17 DIAGNOSIS — S62636D Displaced fracture of distal phalanx of right little finger, subsequent encounter for fracture with routine healing: Secondary | ICD-10-CM | POA: Diagnosis not present

## 2023-01-17 DIAGNOSIS — N1832 Chronic kidney disease, stage 3b: Secondary | ICD-10-CM | POA: Diagnosis not present

## 2023-01-17 DIAGNOSIS — M797 Fibromyalgia: Secondary | ICD-10-CM | POA: Diagnosis not present

## 2023-01-17 DIAGNOSIS — I951 Orthostatic hypotension: Secondary | ICD-10-CM | POA: Diagnosis not present

## 2023-01-17 DIAGNOSIS — Z7901 Long term (current) use of anticoagulants: Secondary | ICD-10-CM | POA: Diagnosis not present

## 2023-01-17 DIAGNOSIS — G894 Chronic pain syndrome: Secondary | ICD-10-CM | POA: Diagnosis not present

## 2023-01-17 DIAGNOSIS — F32A Depression, unspecified: Secondary | ICD-10-CM | POA: Diagnosis not present

## 2023-01-17 DIAGNOSIS — F411 Generalized anxiety disorder: Secondary | ICD-10-CM | POA: Diagnosis not present

## 2023-01-17 DIAGNOSIS — K219 Gastro-esophageal reflux disease without esophagitis: Secondary | ICD-10-CM | POA: Diagnosis not present

## 2023-01-17 DIAGNOSIS — I480 Paroxysmal atrial fibrillation: Secondary | ICD-10-CM | POA: Diagnosis not present

## 2023-01-17 DIAGNOSIS — G4700 Insomnia, unspecified: Secondary | ICD-10-CM | POA: Diagnosis not present

## 2023-01-17 DIAGNOSIS — Z9181 History of falling: Secondary | ICD-10-CM | POA: Diagnosis not present

## 2023-01-17 DIAGNOSIS — M4856XD Collapsed vertebra, not elsewhere classified, lumbar region, subsequent encounter for fracture with routine healing: Secondary | ICD-10-CM | POA: Diagnosis not present

## 2023-01-17 DIAGNOSIS — Z87440 Personal history of urinary (tract) infections: Secondary | ICD-10-CM | POA: Diagnosis not present

## 2023-01-17 DIAGNOSIS — I5020 Unspecified systolic (congestive) heart failure: Secondary | ICD-10-CM | POA: Diagnosis not present

## 2023-01-17 DIAGNOSIS — E782 Mixed hyperlipidemia: Secondary | ICD-10-CM | POA: Diagnosis not present

## 2023-01-25 DIAGNOSIS — I5020 Unspecified systolic (congestive) heart failure: Secondary | ICD-10-CM | POA: Diagnosis not present

## 2023-01-25 DIAGNOSIS — Z7901 Long term (current) use of anticoagulants: Secondary | ICD-10-CM | POA: Diagnosis not present

## 2023-01-25 DIAGNOSIS — I951 Orthostatic hypotension: Secondary | ICD-10-CM | POA: Diagnosis not present

## 2023-01-25 DIAGNOSIS — G4700 Insomnia, unspecified: Secondary | ICD-10-CM | POA: Diagnosis not present

## 2023-01-25 DIAGNOSIS — K219 Gastro-esophageal reflux disease without esophagitis: Secondary | ICD-10-CM | POA: Diagnosis not present

## 2023-01-25 DIAGNOSIS — Z961 Presence of intraocular lens: Secondary | ICD-10-CM | POA: Diagnosis not present

## 2023-01-25 DIAGNOSIS — I130 Hypertensive heart and chronic kidney disease with heart failure and stage 1 through stage 4 chronic kidney disease, or unspecified chronic kidney disease: Secondary | ICD-10-CM | POA: Diagnosis not present

## 2023-01-25 DIAGNOSIS — I480 Paroxysmal atrial fibrillation: Secondary | ICD-10-CM | POA: Diagnosis not present

## 2023-01-25 DIAGNOSIS — F411 Generalized anxiety disorder: Secondary | ICD-10-CM | POA: Diagnosis not present

## 2023-01-25 DIAGNOSIS — N1832 Chronic kidney disease, stage 3b: Secondary | ICD-10-CM | POA: Diagnosis not present

## 2023-01-25 DIAGNOSIS — F32A Depression, unspecified: Secondary | ICD-10-CM | POA: Diagnosis not present

## 2023-01-25 DIAGNOSIS — Z9181 History of falling: Secondary | ICD-10-CM | POA: Diagnosis not present

## 2023-01-25 DIAGNOSIS — E782 Mixed hyperlipidemia: Secondary | ICD-10-CM | POA: Diagnosis not present

## 2023-01-25 DIAGNOSIS — M4856XD Collapsed vertebra, not elsewhere classified, lumbar region, subsequent encounter for fracture with routine healing: Secondary | ICD-10-CM | POA: Diagnosis not present

## 2023-01-25 DIAGNOSIS — S62636D Displaced fracture of distal phalanx of right little finger, subsequent encounter for fracture with routine healing: Secondary | ICD-10-CM | POA: Diagnosis not present

## 2023-01-25 DIAGNOSIS — K580 Irritable bowel syndrome with diarrhea: Secondary | ICD-10-CM | POA: Diagnosis not present

## 2023-01-25 DIAGNOSIS — G894 Chronic pain syndrome: Secondary | ICD-10-CM | POA: Diagnosis not present

## 2023-01-25 DIAGNOSIS — Z87440 Personal history of urinary (tract) infections: Secondary | ICD-10-CM | POA: Diagnosis not present

## 2023-01-25 DIAGNOSIS — M329 Systemic lupus erythematosus, unspecified: Secondary | ICD-10-CM | POA: Diagnosis not present

## 2023-01-25 DIAGNOSIS — M797 Fibromyalgia: Secondary | ICD-10-CM | POA: Diagnosis not present

## 2023-01-25 DIAGNOSIS — J449 Chronic obstructive pulmonary disease, unspecified: Secondary | ICD-10-CM | POA: Diagnosis not present

## 2023-01-28 ENCOUNTER — Telehealth: Payer: Self-pay

## 2023-01-28 NOTE — Telephone Encounter (Signed)
[place] a physical therapist with [facility] called and left vm requesting a call back regarding verbals to extend PT 1w8, call back number [phone]

## 2023-01-31 NOTE — Telephone Encounter (Signed)
Wes Informed via confidental VM

## 2023-02-01 ENCOUNTER — Emergency Department: Payer: PPO

## 2023-02-01 ENCOUNTER — Observation Stay
Admission: EM | Admit: 2023-02-01 | Discharge: 2023-02-03 | Disposition: A | Discharge status: Home Health | Payer: PPO | Attending: Internal Medicine | Admitting: Internal Medicine

## 2023-02-01 ENCOUNTER — Other Ambulatory Visit: Payer: Self-pay

## 2023-02-01 DIAGNOSIS — Z91040 Latex allergy status: Secondary | ICD-10-CM | POA: Insufficient documentation

## 2023-02-01 DIAGNOSIS — Z87891 Personal history of nicotine dependence: Secondary | ICD-10-CM | POA: Diagnosis not present

## 2023-02-01 DIAGNOSIS — J90 Pleural effusion, not elsewhere classified: Secondary | ICD-10-CM | POA: Diagnosis not present

## 2023-02-01 DIAGNOSIS — Z1152 Encounter for screening for COVID-19: Secondary | ICD-10-CM | POA: Diagnosis not present

## 2023-02-01 DIAGNOSIS — I4510 Unspecified right bundle-branch block: Secondary | ICD-10-CM | POA: Insufficient documentation

## 2023-02-01 DIAGNOSIS — R58 Hemorrhage, not elsewhere classified: Secondary | ICD-10-CM | POA: Insufficient documentation

## 2023-02-01 DIAGNOSIS — W01198A Fall on same level from slipping, tripping and stumbling with subsequent striking against other object, initial encounter: Secondary | ICD-10-CM | POA: Diagnosis not present

## 2023-02-01 DIAGNOSIS — G4700 Insomnia, unspecified: Secondary | ICD-10-CM | POA: Diagnosis present

## 2023-02-01 DIAGNOSIS — J439 Emphysema, unspecified: Secondary | ICD-10-CM | POA: Diagnosis not present

## 2023-02-01 DIAGNOSIS — Z8673 Personal history of transient ischemic attack (TIA), and cerebral infarction without residual deficits: Secondary | ICD-10-CM | POA: Diagnosis not present

## 2023-02-01 DIAGNOSIS — R296 Repeated falls: Secondary | ICD-10-CM | POA: Diagnosis not present

## 2023-02-01 DIAGNOSIS — I482 Chronic atrial fibrillation, unspecified: Secondary | ICD-10-CM | POA: Insufficient documentation

## 2023-02-01 DIAGNOSIS — R29898 Other symptoms and signs involving the musculoskeletal system: Secondary | ICD-10-CM | POA: Diagnosis present

## 2023-02-01 DIAGNOSIS — S0990XA Unspecified injury of head, initial encounter: Secondary | ICD-10-CM | POA: Insufficient documentation

## 2023-02-01 DIAGNOSIS — R2689 Other abnormalities of gait and mobility: Secondary | ICD-10-CM | POA: Diagnosis present

## 2023-02-01 DIAGNOSIS — Z9889 Other specified postprocedural states: Secondary | ICD-10-CM

## 2023-02-01 DIAGNOSIS — Z853 Personal history of malignant neoplasm of breast: Secondary | ICD-10-CM | POA: Diagnosis not present

## 2023-02-01 DIAGNOSIS — I129 Hypertensive chronic kidney disease with stage 1 through stage 4 chronic kidney disease, or unspecified chronic kidney disease: Secondary | ICD-10-CM | POA: Insufficient documentation

## 2023-02-01 DIAGNOSIS — N1832 Chronic kidney disease, stage 3b: Secondary | ICD-10-CM | POA: Diagnosis not present

## 2023-02-01 DIAGNOSIS — E785 Hyperlipidemia, unspecified: Secondary | ICD-10-CM | POA: Diagnosis present

## 2023-02-01 DIAGNOSIS — F411 Generalized anxiety disorder: Secondary | ICD-10-CM | POA: Diagnosis present

## 2023-02-01 DIAGNOSIS — R55 Syncope and collapse: Secondary | ICD-10-CM | POA: Diagnosis present

## 2023-02-01 DIAGNOSIS — Z043 Encounter for examination and observation following other accident: Secondary | ICD-10-CM | POA: Diagnosis not present

## 2023-02-01 DIAGNOSIS — R531 Weakness: Secondary | ICD-10-CM | POA: Diagnosis not present

## 2023-02-01 DIAGNOSIS — Z7901 Long term (current) use of anticoagulants: Secondary | ICD-10-CM | POA: Diagnosis not present

## 2023-02-01 DIAGNOSIS — Z23 Encounter for immunization: Secondary | ICD-10-CM | POA: Insufficient documentation

## 2023-02-01 DIAGNOSIS — E876 Hypokalemia: Secondary | ICD-10-CM | POA: Insufficient documentation

## 2023-02-01 DIAGNOSIS — Z79899 Other long term (current) drug therapy: Secondary | ICD-10-CM | POA: Diagnosis not present

## 2023-02-01 DIAGNOSIS — S0003XA Contusion of scalp, initial encounter: Secondary | ICD-10-CM | POA: Diagnosis not present

## 2023-02-01 DIAGNOSIS — J449 Chronic obstructive pulmonary disease, unspecified: Secondary | ICD-10-CM | POA: Diagnosis not present

## 2023-02-01 DIAGNOSIS — R413 Other amnesia: Secondary | ICD-10-CM | POA: Diagnosis present

## 2023-02-01 DIAGNOSIS — R42 Dizziness and giddiness: Secondary | ICD-10-CM | POA: Diagnosis not present

## 2023-02-01 DIAGNOSIS — W19XXXA Unspecified fall, initial encounter: Secondary | ICD-10-CM

## 2023-02-01 DIAGNOSIS — I4891 Unspecified atrial fibrillation: Secondary | ICD-10-CM

## 2023-02-01 DIAGNOSIS — R000 Tachycardia, unspecified: Secondary | ICD-10-CM | POA: Insufficient documentation

## 2023-02-01 LAB — CBC WITH DIFFERENTIAL/PLATELET
Abs Immature Granulocytes: 0.03 10*3/uL (ref 0.00–0.07)
Basophils Absolute: 0.1 10*3/uL (ref 0.0–0.1)
Basophils Relative: 1 %
Eosinophils Absolute: 0.3 10*3/uL (ref 0.0–0.5)
Eosinophils Relative: 4 %
HCT: 36 % (ref 36.0–46.0)
Hemoglobin: 11.5 g/dL — ABNORMAL LOW (ref 12.0–15.0)
Immature Granulocytes: 1 %
Lymphocytes Relative: 17 %
Lymphs Abs: 1.1 10*3/uL (ref 0.7–4.0)
MCH: 30.4 pg (ref 26.0–34.0)
MCHC: 31.9 g/dL (ref 30.0–36.0)
MCV: 95.2 fL (ref 80.0–100.0)
Monocytes Absolute: 0.5 10*3/uL (ref 0.1–1.0)
Monocytes Relative: 8 %
Neutro Abs: 4.6 10*3/uL (ref 1.7–7.7)
Neutrophils Relative %: 69 %
Platelets: 219 10*3/uL (ref 150–400)
RBC: 3.78 MIL/uL — ABNORMAL LOW (ref 3.87–5.11)
RDW: 17.5 % — ABNORMAL HIGH (ref 11.5–15.5)
WBC: 6.5 10*3/uL (ref 4.0–10.5)
nRBC: 0 % (ref 0.0–0.2)

## 2023-02-01 LAB — COMPREHENSIVE METABOLIC PANEL
ALT: 26 U/L (ref 0–44)
AST: 37 U/L (ref 15–41)
Albumin: 3.3 g/dL — ABNORMAL LOW (ref 3.5–5.0)
Alkaline Phosphatase: 660 U/L — ABNORMAL HIGH (ref 38–126)
Anion gap: 11 (ref 5–15)
BUN: 23 mg/dL (ref 8–23)
CO2: 23 mmol/L (ref 22–32)
Calcium: 8.7 mg/dL — ABNORMAL LOW (ref 8.9–10.3)
Chloride: 107 mmol/L (ref 98–111)
Creatinine, Ser: 1.13 mg/dL — ABNORMAL HIGH (ref 0.44–1.00)
GFR, Estimated: 50 mL/min — ABNORMAL LOW (ref >60)
Glucose, Bld: 103 mg/dL — ABNORMAL HIGH (ref 70–99)
Potassium: 3 mmol/L — ABNORMAL LOW (ref 3.5–5.1)
Sodium: 141 mmol/L (ref 135–145)
Total Bilirubin: 1.1 mg/dL (ref 0.3–1.2)
Total Protein: 7.2 g/dL (ref 6.5–8.1)

## 2023-02-01 LAB — RESP PANEL BY RT-PCR (RSV, FLU A&B, COVID)  RVPGX2
Influenza A by PCR: NEGATIVE
Influenza B by PCR: NEGATIVE
Resp Syncytial Virus by PCR: NEGATIVE
SARS Coronavirus 2 by RT PCR: NEGATIVE

## 2023-02-01 LAB — PROCALCITONIN: Procalcitonin: <0.1 ng/mL

## 2023-02-01 LAB — TROPONIN I (HIGH SENSITIVITY)
Troponin I (High Sensitivity): 17 ng/L (ref <18)
Troponin I (High Sensitivity): 17 ng/L (ref <18)

## 2023-02-01 LAB — CK: Total CK: 57 U/L (ref 38–234)

## 2023-02-01 LAB — MAGNESIUM: Magnesium: 2 mg/dL (ref 1.7–2.4)

## 2023-02-01 LAB — BRAIN NATRIURETIC PEPTIDE: B Natriuretic Peptide: 1272.9 pg/mL — ABNORMAL HIGH (ref 0.0–100.0)

## 2023-02-01 MED ORDER — ALPRAZOLAM 0.25 MG PO TABS: 0.2500 mg | ORAL_TABLET | Freq: Every evening | ORAL | Status: DC | PRN

## 2023-02-01 MED ORDER — FOLIC ACID 1 MG PO TABS
1.0000 mg | ORAL_TABLET | Freq: Every day | ORAL | Status: DC
  Administered 2023-02-02 – 2023-02-03 (×2): 1 mg via ORAL
  Filled 2023-02-01 (×2): qty 1

## 2023-02-01 MED ORDER — SODIUM CHLORIDE 0.9% FLUSH
3.0000 mL | Freq: Two times a day (BID) | INTRAVENOUS | Status: DC
  Administered 2023-02-01 – 2023-02-03 (×4): 3 mL via INTRAVENOUS

## 2023-02-01 MED ORDER — POTASSIUM CHLORIDE CRYS ER 20 MEQ PO TBCR: 40.0000 meq | EXTENDED_RELEASE_TABLET | Freq: Once | ORAL | Status: DC

## 2023-02-01 MED ORDER — METOPROLOL TARTRATE 25 MG PO TABS
25.0000 mg | ORAL_TABLET | Freq: Two times a day (BID) | ORAL | Status: DC
  Administered 2023-02-02 – 2023-02-03 (×4): 25 mg via ORAL
  Filled 2023-02-01 (×4): qty 1

## 2023-02-01 MED ORDER — TETANUS-DIPHTH-ACELL PERTUSSIS 5-2.5-18.5 LF-MCG/0.5 IM SUSY
0.5000 mL | PREFILLED_SYRINGE | Freq: Once | INTRAMUSCULAR | Status: AC
  Administered 2023-02-01: 0.5 mL via INTRAMUSCULAR
  Filled 2023-02-01: qty 0.5

## 2023-02-01 MED ORDER — HYDROCODONE-ACETAMINOPHEN 5-325 MG PO TABS
1.0000 | ORAL_TABLET | Freq: Four times a day (QID) | ORAL | Status: AC | PRN
  Administered 2023-02-01 – 2023-02-02 (×4): 1 via ORAL
  Filled 2023-02-01 (×4): qty 1

## 2023-02-01 MED ORDER — SENNOSIDES-DOCUSATE SODIUM 8.6-50 MG PO TABS: 1.0000 | ORAL_TABLET | Freq: Every evening | ORAL | Status: DC | PRN

## 2023-02-01 MED ORDER — ONDANSETRON HCL 4 MG PO TABS: 4.0000 mg | ORAL_TABLET | Freq: Four times a day (QID) | ORAL | Status: DC | PRN

## 2023-02-01 MED ORDER — ZOLPIDEM TARTRATE 5 MG PO TABS
5.0000 mg | ORAL_TABLET | Freq: Every evening | ORAL | Status: DC | PRN
  Administered 2023-02-02: 5 mg via ORAL
  Filled 2023-02-01: qty 1

## 2023-02-01 MED ORDER — POTASSIUM CHLORIDE CRYS ER 20 MEQ PO TBCR
40.0000 meq | EXTENDED_RELEASE_TABLET | Freq: Once | ORAL | Status: AC
  Administered 2023-02-01: 40 meq via ORAL
  Filled 2023-02-01: qty 2

## 2023-02-01 MED ORDER — CYCLOBENZAPRINE HCL 10 MG PO TABS: 10.0000 mg | ORAL_TABLET | Freq: Three times a day (TID) | ORAL | Status: DC | PRN

## 2023-02-01 MED ORDER — ONDANSETRON HCL 4 MG/2ML IJ SOLN: 4.0000 mg | Freq: Four times a day (QID) | INTRAMUSCULAR | Status: DC | PRN

## 2023-02-01 MED ORDER — ACETAMINOPHEN 650 MG RE SUPP: 650.0000 mg | Freq: Four times a day (QID) | RECTAL | Status: DC | PRN

## 2023-02-01 MED ORDER — METOPROLOL TARTRATE 50 MG PO TABS
50.0000 mg | ORAL_TABLET | Freq: Once | ORAL | Status: AC
  Administered 2023-02-01: 50 mg via ORAL
  Filled 2023-02-01: qty 1

## 2023-02-01 MED ORDER — APIXABAN 5 MG PO TABS: 5.0000 mg | ORAL_TABLET | Freq: Two times a day (BID) | ORAL | Status: DC

## 2023-02-01 MED ORDER — ACETAMINOPHEN 325 MG PO TABS
650.0000 mg | ORAL_TABLET | Freq: Four times a day (QID) | ORAL | Status: DC | PRN
  Administered 2023-02-02 – 2023-02-03 (×4): 650 mg via ORAL
  Filled 2023-02-01 (×4): qty 2

## 2023-02-01 MED ORDER — ACETAMINOPHEN 500 MG PO TABS
1000.0000 mg | ORAL_TABLET | Freq: Once | ORAL | Status: AC
  Administered 2023-02-01: 1000 mg via ORAL
  Filled 2023-02-01: qty 2

## 2023-02-01 NOTE — Assessment & Plan Note (Signed)
Fall precautions.  

## 2023-02-01 NOTE — Assessment & Plan Note (Addendum)
-   Apixaban 5 mg p.o. twice daily, holding due to scalp bleeding

## 2023-02-01 NOTE — ED Triage Notes (Addendum)
Pt presents to ED with c/o of a fall, pt unsure if she hit her head. Pt denies LOC. Pt states she slipped but may got dizzy before dizzy. Pt takes Eliquis.   Pt does have a bump to back of head.

## 2023-02-01 NOTE — ED Provider Notes (Signed)
[facility] Provider Note    Event Date/Time   First MD Initiated Contact with Patient 02/01/23 1812     (approximate)   History   Fall   HPI  Donna Evans is a 77 y.o. female  atrial fibrillation on Eliquis, hypertension, hyperlipidemia, insomnia who comes in with a fall.  Patient reports potentially slipping or may be having some dizziness and then falling she is not exactly sure. Unsure if LOC She does take Eliquis.  Patient reports that she is been taking all of her medications.  Patient reports that she has had 5 falls.  She lives by herself.  She states that she is not exactly sure why she falls.  She denies any new chest pain, abdominal pain.  She was able to ambulate with her family member.  Physical Exam   Triage Vital Signs: ED Triage Vitals  Enc Vitals Group     BP 02/01/23 1645 102/85     Pulse Rate 02/01/23 1645 (!) 106     Resp 02/01/23 1645 17     Temp 02/01/23 1645 98.8 F (37.1 C)     Temp Source 02/01/23 1645 Oral     SpO2 02/01/23 1645 95 %     Weight --      Height --      Head Circumference --      Peak Flow --      Pain Score 02/01/23 1646 8     Pain Loc --      Pain Edu? --      Excl. in [place]? --     Most recent vital signs: Vitals:   02/01/23 1645  BP: 102/85  Pulse: (!) 106  Resp: 17  Temp: 98.8 F (37.1 C)  SpO2: 95%     General: Awake, no distress.  CV:  Good peripheral perfusion.  Tachycardic, irregular Resp:  Normal effort.  Abd:  No distention.  Other:  Right leg greater than her left leg but they report that is baseline.  Abdomen soft nontender.  No CTL spine tenderness. Patient is hematoma to the back of the right head with with a small 1 cm blood blister without any active bleeding or laceration requiring repair.  ED Results / Procedures / Treatments   Labs (all labs ordered are listed, but only abnormal results are displayed) Labs Reviewed  RESP PANEL BY RT-PCR (RSV, FLU A&B, COVID)  [id number]  CK   CBC WITH DIFFERENTIAL/PLATELET  COMPREHENSIVE METABOLIC PANEL  URINALYSIS, ROUTINE W REFLEX MICROSCOPIC  BRAIN NATRIURETIC PEPTIDE  TROPONIN I (HIGH SENSITIVITY)     EKG  My interpretation of EKG:  Atrial fibrillation with a rate of 112 without any ST elevation, T wave version in V3 with prolonged QTc  RADIOLOGY I have reviewed the CT head personally interpreted no evidence of intracranial hemorrhage  PROCEDURES:  Critical Care performed: No  .1-3 Lead EKG Interpretation  Performed by: Vanessa [place], MD Authorized by: Vanessa [place], MD     Interpretation: abnormal     ECG rate:  112   ECG rate assessment: tachycardic     Rhythm: atrial fibrillation     Ectopy: none     Conduction: normal      MEDICATIONS ORDERED IN ED: Medications  Tdap (BOOSTRIX) injection 0.5 mL (0.5 mLs Intramuscular Given 02/01/23 1936)  metoprolol tartrate (LOPRESSOR) tablet 50 mg (50 mg Oral Given 02/01/23 1959)  acetaminophen (TYLENOL) tablet 1,000 mg (1,000 mg Oral Given 02/01/23 2016)  potassium chloride SA (KLOR-CON M) CR tablet 40 mEq (40 mEq Oral Given 02/01/23 2043)     IMPRESSION / MDM / ASSESSMENT AND PLAN / ED COURSE  I reviewed the triage vital signs and the nursing notes.   Patient's presentation is most consistent with acute presentation with potential threat to life or bodily function.   Differential includes intracranial hemorrhage, cervical fracture.  No chest wall pain or abdominal tenderness or extremity tenderness to suggest other fractures but given the recurrent falls get basic labs to evaluate for dehydration.  Will update patient's tetanus. Heart meds- metoprolol 50 mg 3 times daily, amiodarone once daily, Eliquis, midodrine 2.5 2 times daily.  Given patient's heart rates are slightly elevated we will give her her home metoprolol  CT imaging of the head and neck show a age-indeterminate compression fracture of T4 CT head with scalp hematoma without any evidence of  intercranial process  CBC shows stable hemoglobin Magnesium was normal CK normal CMP shows slightly low potassium we will give some oral repletion.  Creatinine around baseline Troponin is negative  Reevaluated patient given some home metoprolol for elevated heart rates.  She is no tenderness on her T-spine.  Given her recurrent falls unclear exactly why she is falling will discuss the hospital team for admission could be syncopal her QTc is prolonged and she does have A-fib.  She will need PT OT potential placement.  The patient is on the cardiac monitor to evaluate for evidence of arrhythmia and/or significant heart rate changes.      FINAL CLINICAL IMPRESSION(S) / ED DIAGNOSES   Final diagnoses:  Fall, initial encounter  Injury of head, initial encounter  Atrial fibrillation, unspecified type ([place])     Rx / DC Orders   ED Discharge Orders     None        Note:  This document was prepared using Dragon voice recognition software and may include unintentional dictation errors.   Vanessa [place], MD 02/01/23 [phone]

## 2023-02-01 NOTE — Assessment & Plan Note (Signed)
At baseline 

## 2023-02-01 NOTE — H&P (Signed)
History and Physical   Donna Evans [id number] DOB: 1946-08-26 DOA: 02/01/2023  PCP: Jodi Marble, MD  Patient coming from: home via EMS  I have personally briefly reviewed patient's old medical records in [place].  Chief Concern: frequent fall  HPI: Donna Evans is a77 year old female with history of atrial fibrillation on Eliquis, hypertension, insomnia, history of bilateral breast cancer with breast implants, who presents emergency department for chief concerns of frequent falls.  Initial vitals in the ED showed temperature of 98.8, respiration rate of 17, heart rate of 106, blood pressure 102/85, SpO2 of 95% on room air.  sNa 141, potassium 3.0, chloride 107, bicarb 23, BUN of 23, serum creatinine of 1.13, nonfasting blood glucose 103, EGFR 50, WBC 6.5, hemoglobin 11.5, platelets of 219.  CK was 53.  High sensitive troponin was 17. COVID/influenza A/influenza B/RSV PCR were negative.  ED treatment: Acetaminophen 1 g p.o., metoprolol 50 mg p.o. one-time dose, potassium chloride 40 mEq once, Tdap booster one-time dose ordered. ---------------------- At bedside, she is able to me her name, age, current location, current year.   She reports increased falling over the last few days. She denies changes to diet, chest pain, shortness of breath, dysuria, diarrhea, fever. She denies trauma.   Social history: She lives at home by herself. She smokes 5 cigarettes per day. She drinks 1 shot of etoh every 2 months. She denies drug use.   ROS: Constitutional: no weight change, no fever ENT/Mouth: no sore throat, no rhinorrhea Eyes: no eye pain, no vision changes Cardiovascular: no chest pain, no dyspnea,  no edema, no palpitations Respiratory: no cough, no sputum, no wheezing Gastrointestinal: no nausea, no vomiting, no diarrhea, no constipation Genitourinary: no urinary incontinence, no dysuria, no hematuria Musculoskeletal: no arthralgias, no myalgias Skin: no skin  lesions, no pruritus, Neuro: + weakness, no loss of consciousness, no syncope Psych: no anxiety, no depression, no decrease appetite Heme/Lymph: no bruising, no bleeding  ED Course: Discussed with EDP, patient requiring hospitalization for chief concerns of increasing fall and possible syncope.  Assessment/Plan  Principal Problem:   Frequent falls Active Problems:   Syncope   Generalized anxiety disorder   Insomnia   S/P cardiac catheterization   Memory change   Imbalance   Bilateral leg weakness   HLD (hyperlipidemia)   Stage 3b chronic kidney disease (CKD) (HCC)   Atrial fibrillation, chronic (HCC)   Hypokalemia   Bleeding   Assessment and Plan:  * Frequent falls Possible syncope - Query polypharmacy in setting of zolpidem and Flexeril use - Holding home Flexeril at this time - Check serum B12 level - Check UA - Fall precaution  Syncope Fall precautions  Bleeding At posterior right scalp, presumed secondary to mechanical fall - Hold home eliquis at this time - Nursing instruction for dry gauze dressing   Hypokalemia -  serum magnesium level was 2.0 on admission - Status post potassium chloride 40 mEq p.o. one-time dose per EDP - Repeat BMP in the a.m.  Atrial fibrillation, chronic (HCC) - Apixaban 5 mg p.o. twice daily, holding due to scalp bleeding  Stage 3b chronic kidney disease (CKD) (HCC) - At baseline  Memory change - Query developing dementia  Insomnia - Resumed home zolpidem 5 mg nightly as needed for sleep  Generalized anxiety disorder - Alprazolam 0.25 mg nightly as needed for anxiety, 2 doses ordered  Chart reviewed.   Pending med reconciliation  DVT prophylaxis: Holding home Eliquis, no pharmacologic DVT prophylaxis ordered  due to bleeding at the posterior head/scalp Code Status: DNR, confirmed with nursing staff in the room Diet: Heart healthy Family Communication: A phone call was offered, patient declined stating that she does not  have any family Disposition Plan: Pending clinical course Consults called: None at this time Admission status: observation, telemetry cardiac  Past Medical History:  Diagnosis Date   Allergic state    Anginal pain ([place])    Prinzmetal's angina   Anxiety    Arthritis    osteoarthritis   Breast cancer ([place]) 1990   right breast cancer   Chronic pain    Chronic pain    COPD (chronic obstructive pulmonary disease) ([place])    Depression    Edema    Fibromyalgia    Foot fracture    Bilateral   GERD (gastroesophageal reflux disease)    History of kidney stones    Hypertension    Leg fracture, right    Low back pain    Lumbosacral neuritis    Medulloadrenal hyperfunction (HCC)    Migraine headache    Peripheral neuropathy    Shoulder fracture, right    Stroke ([place])    TIA   Systemic lupus erythematosus ([place])    Thyroid disease    TIA (transient ischemic attack) 11/28/2013   Transient global amnesia 2011   Past Surgical History:  Procedure Laterality Date   ABDOMINAL HYSTERECTOMY  1981   APPENDECTOMY  1957   AUGMENTATION MAMMAPLASTY Bilateral 1990   saline submuscular   BACK SURGERY  1989   BILATERAL TOTAL MASTECTOMY WITH AXILLARY LYMPH NODE DISSECTION  1990   BREAST IMPLANT EXCHANGE Bilateral 05/11/2016   Procedure: REMOVAL AND REPLACEMENT OF BREAST IMPLANTS ;  Surgeon: Irene Limbo, MD;  Location: [place];  Service: Plastics;  Laterality: Bilateral;   CAPSULECTOMY Bilateral 05/11/2016   Procedure: CAPSULECTOMY CAPSULORRAPHY ;  Surgeon: Irene Limbo, MD;  Location: [place];  Service: Plastics;  Laterality: Bilateral;   CATARACT EXTRACTION W/PHACO Right 12/26/2018   Procedure: CATARACT EXTRACTION PHACO AND INTRAOCULAR LENS PLACEMENT ([place]) RIGHT;  Surgeon: Birder Robson, MD;  Location: [facility];  Service: Ophthalmology;  Laterality: Right;  [place]  00:46 CDE 8.50 Fluid pack lot # [id number]   CATARACT EXTRACTION W/PHACO Left 01/23/2019    Procedure: CATARACT EXTRACTION PHACO AND INTRAOCULAR LENS PLACEMENT ([place]) LEFT;  Surgeon: Birder Robson, MD;  Location: [facility];  Service: Ophthalmology;  Laterality: Left;  [place]  00:45 CDE  8.41 fluid pack lot # [id number]   CHOLECYSTECTOMY  1979   COLONOSCOPY WITH PROPOFOL N/A 01/25/2018   Procedure: COLONOSCOPY WITH PROPOFOL;  Surgeon: Manya Silvas, MD;  Location: [facility] END[facility];  Service: Endoscopy;  Laterality: N/A;   COPD     ESOPHAGOGASTRODUODENOSCOPY (EGD) WITH PROPOFOL N/A 03/07/2018   Procedure: ESOPHAGOGASTRODUODENOSCOPY (EGD) WITH PROPOFOL;  Surgeon: Manya Silvas, MD;  [facility];  Service: Endoscopy;  Laterality: N/A;   FRACTURE SURGERY     HARDWARE REMOVAL Left 06/18/2020   Procedure: HARDWARE REMOVAL;  Surgeon: Lovell Sheehan, MD;  Location: [facility];  Service: Orthopedics;  Laterality: Left;   HUMERUS IM NAIL Left 12/31/2019   Procedure: INTRAMEDULLARY (IM) NAIL HUMERAL;  Surgeon: Lovell Sheehan, MD;  Location: [facility];  Service: Orthopedics;  Laterality: Left;   LIPOSUCTION Right 05/11/2016   Procedure: LIPOSUCTION;  Surgeon: Irene Limbo, MD;  Location: [place];  Service: Plastics;  Laterality: Right;   LIVER BIOPSY  2011   MASTECTOMY SUBCUTANEOUS Bilateral 1990  MASTOPEXY Bilateral 05/11/2016   Procedure: MASTOPEXY BILATERAL ;  Surgeon: Irene Limbo, MD;  Location: [place];  Service: Plastics;  Laterality: Bilateral;   Social History:  reports that she quit smoking about 17 years ago. Her smoking use included cigarettes. She has a 40.00 pack-year smoking history. She has never used smokeless tobacco. She reports that she does not currently use alcohol. She reports that she does not use drugs.  Allergies  Allergen Reactions   Ciprofloxacin Nausea And Vomiting and Hives   Codeine Anaphylaxis, Hives and Other (See Comments)    Reaction:  Unknown   Fluconazole Hives   Latex Anaphylaxis and Rash    Meperidine Anaphylaxis and Hives    Reaction:  Unknown  Reaction:  Unknown   Unknown   Morphine And Related Hives, Anaphylaxis and Other (See Comments)    Reaction:  Unknown   unknown  Other reaction(s): Other (See Comments)  unknown   Doxycycline Nausea And Vomiting   Flagyl [Metronidazole] Nausea And Vomiting    Reaction:  Unknown    Influenza Vaccines Swelling    Localized swelling   Tape Other (See Comments)    Pt allergic to Adhesive tape and latex.(  Skin breaks out)   Valtrex [Valacyclovir Hcl] Hives   Buprenorphine     Other reaction(s): Other (see comments) unknown   Duloxetine Other (See Comments)    Other Reaction(s): GI intolerance  Reaction:  Unknown     Reaction:  Unknown     Reaction:  Unknown   Silicone Other (See Comments)    Pt allergic to Adhesive tape and latex.(  Skin breaks out)   Tramadol     Other reaction(s): Not available   Amoxicillin-Pot Clavulanate Nausea And Vomiting   Sulfamethoxazole-Trimethoprim Nausea And Vomiting and Rash   Valacyclovir Rash   Family History  Problem Relation Age of Onset   Atrial fibrillation Mother    Atrial fibrillation Sister    Cancer Sister    Diabetes Sister    Breast cancer Sister 37   Diabetes Father    Cancer Sister    Diabetes Sister    Atrial fibrillation Sister    Kidney disease Maternal Aunt    Family history: Family history reviewed and not pertinent.  Prior to Admission medications   Medication Sig Start Date End Date Taking? Authorizing Provider  ALPRAZolam Duanne Moron) 1 MG tablet Take 1 mg by mouth at bedtime. 11/27/22   [provider]  alum & mag hydroxide-simeth (MAALOX/MYLANTA) 200-200-20 MG/5ML suspension Take 15 mLs by mouth every 6 (six) hours as needed for indigestion or heartburn.    [provider]  amiodarone (PACERONE) 200 MG tablet Take 1 tablet (200 mg total) by mouth 2 (two) times daily for 12 days, THEN 1 tablet (200 mg total) daily. Patient taking differently:  Take 1 tablet daily 11/18/22 12/30/22  Mariel Aloe, MD  apixaban (ELIQUIS) 5 MG TABS tablet Take 1 tablet (5 mg total) by mouth 2 (two) times daily. 10/18/22   Emeterio Reeve, DO  butorphanol (STADOL) 10 MG/ML nasal spray Place 1 spray into the nose every 4 (four) hours as needed. 11/26/22   [provider]  calcium carbonate (TUMS - DOSED IN MG ELEMENTAL CALCIUM) 500 MG chewable tablet Chew 1 tablet by mouth 3 (three) times daily as needed for indigestion or heartburn.    [provider]  cyclobenzaprine (FEXMID) 7.5 MG tablet Take 1 tablet (7.5 mg total) by mouth 3 (three) times daily as needed  for muscle spasms. 11/18/22   Mariel Aloe, MD  folic acid (FOLVITE) 1 MG tablet Take 1 tablet (1 mg total) by mouth daily. 10/18/22   Emeterio Reeve, DO  lidocaine (LIDODERM) 5 % Place 1 patch onto the skin daily. Remove & Discard patch within 12 hours or as directed by MD. Apply to back. 11/19/22   Mariel Aloe, MD  metoprolol tartrate (LOPRESSOR) 50 MG tablet Take 1 tablet (50 mg total) by mouth 3 (three) times daily. Patient taking differently: Take 25 mg by mouth 2 (two) times daily. 11/18/22   Mariel Aloe, MD  midodrine (PROAMATINE) 2.5 MG tablet Take 1 tablet (2.5 mg total) by mouth 3 (three) times daily with meals. Patient taking differently: Take 2.5 mg by mouth daily. 11/18/22   Mariel Aloe, MD  Vitamin D, Ergocalciferol, 50000 units CAPS Take 1 capsule by mouth once a week. 07/23/22   [provider]  zolpidem (AMBIEN) 10 MG tablet Take 0.5 tablets (5 mg total) by mouth at bedtime. 11/18/22   Mariel Aloe, MD   Physical Exam: Vitals:   02/01/23 1900 02/01/23 1959 02/01/23 2209 02/01/23 2315  BP: 139/87 116/79 139/85 (!) 152/99  Pulse: (!) 109 (!) 112 (!) 103 (!) 108  Resp: (!) 21  20 16  $ Temp:   97.9 F (36.6 C) 98 F (36.7 C)  TempSrc:   Oral   SpO2: 97%  97% 99%   Constitutional: appears age-appropriate, frail, cachectic appearing, NAD,  calm, comfortable Eyes: PERRL, lids and conjunctivae normal ENMT: Mucous membranes are moist. Posterior pharynx clear of any exudate or lesions. Age-appropriate dentition. Hearing appropriate Neck: normal, supple, no masses, no thyromegaly Respiratory: clear to auscultation bilaterally, no wheezing, no crackles. Normal respiratory effort. No accessory muscle use.  Cardiovascular: Regular rate and rhythm, no murmurs / rubs / gallops. No extremity edema. 2+ pedal pulses. No carotid bruits.  Abdomen: Scaphoid abdomen, no tenderness, no masses palpated, no hepatosplenomegaly. Bowel sounds positive.  Musculoskeletal: no clubbing / cyanosis. No joint deformity upper and lower extremities. Good ROM, no contractures, no atrophy. Normal muscle tone.  Skin: no rashes, lesions, ulcers. No induration Neurologic: Sensation intact. Strength 5/5 in all 4.  Psychiatric: Normal judgment and insight. Alert and oriented x 3. Normal mood.   EKG: independently reviewed, showing atrial fibrillation with rate of 112, QTc 537  Chest x-ray on Admission: I personally reviewed and I agree with radiologist reading as below.  DG Chest Portable 1 View  Result Date: 02/01/2023 CLINICAL DATA:  Weakness EXAM: PORTABLE CHEST 1 VIEW COMPARISON:  11/12/2022 x-ray FINDINGS: Overlapping cardiac leads. Calcified aorta. Normal cardiopericardial silhouette. Underinflation. Interstitial changes are stable and likely chronic. No consolidation, pneumothorax or effusion. Surgical clips in the right upper quadrant of the abdomen. Chronic appearing deformity of the left proximal humerus. IMPRESSION: Underinflation with chronic interstitial changes. No acute cardiopulmonary process identified. Electronically Signed   By: Jill Side M.D.   On: 02/01/2023 19:08   CT Cervical Spine Wo Contrast  Result Date: 02/01/2023 CLINICAL DATA:  [place] EXAM: CT CERVICAL SPINE WITHOUT CONTRAST TECHNIQUE: Multidetector CT imaging of the cervical spine was  performed without intravenous contrast. Multiplanar CT image reconstructions were also generated. RADIATION DOSE REDUCTION: This exam was performed according to the departmental dose-optimization program which includes automated exposure control, adjustment of the mA and/or kV according to patient size and/or use of iterative reconstruction technique. COMPARISON:  11/09/2022, 11/01/2022 FINDINGS: Alignment: Alignment is grossly anatomic. Skull base and vertebrae: No  acute fracture. Age indeterminate mild compression deformity superior endplate T4 vertebral body, new since the 11/01/2022 exam, with less than 10% loss of vertebral body height. Soft tissues and spinal canal: No prevertebral fluid or swelling. No visible canal hematoma. Disc levels: Stable multilevel spondylosis and facet hypertrophy, most pronounced from C4-5 through C6-7. Upper chest: Airways patent. Emphysematous changes at the lung apices. Small left pleural effusion. Other: Reconstructed images demonstrate no additional findings. IMPRESSION: 1. No acute cervical spine fracture. 2. Age-indeterminate mild compression deformity superior endplate T4 vertebral body, with less than 10% loss of vertebral body height. This is new since the 11/01/2022 exam, but otherwise age indeterminate. 3. Stable multilevel cervical degenerative changes. 4. Small left pleural effusion. 5. Emphysema. Electronically Signed   By: Randa Ngo M.D.   On: 02/01/2023 17:18   CT HEAD WO CONTRAST (5MM)  Result Date: 02/01/2023 CLINICAL DATA:  Dizziness, fell, anticoagulated EXAM: CT HEAD WITHOUT CONTRAST TECHNIQUE: Contiguous axial images were obtained from the base of the skull through the vertex without intravenous contrast. RADIATION DOSE REDUCTION: This exam was performed according to the departmental dose-optimization program which includes automated exposure control, adjustment of the mA and/or kV according to patient size and/or use of iterative reconstruction  technique. COMPARISON:  11/09/2022 FINDINGS: Brain: No acute infarct or hemorrhage. Lateral ventricles and midline structures are unremarkable. No acute extra-axial fluid collections. No mass effect. Vascular: No hyperdense vessel or unexpected calcification. Skull: Large right parietal scalp hematoma. No underlying fracture. The remainder of the calvarium is unremarkable. Sinuses/Orbits: Retained secretions within the right sphenoid sinus. Remaining paranasal sinuses are clear. Other: None. IMPRESSION: 1. Large right parietal scalp hematoma.  No underlying fracture. 2. No acute intracranial process. Electronically Signed   By: Randa Ngo M.D.   On: 02/01/2023 17:13    Labs on Admission: I have personally reviewed following labs  CBC: Recent Labs  Lab 02/01/23 1856  WBC 6.5  NEUTROABS 4.6  HGB 11.5*  HCT 36.0  MCV 95.2  PLT [id number]   Basic Metabolic Panel: Recent Labs  Lab 02/01/23 1856  NA 141  K 3.0*  CL 107  CO2 23  GLUCOSE 103*  BUN 23  CREATININE 1.13*  CALCIUM 8.7*  MG 2.0   GFR: CrCl cannot be calculated (Unknown ideal weight.). Liver Function Tests: Recent Labs  Lab 02/01/23 1856  AST 37  ALT 26  ALKPHOS 660*  BILITOT 1.1  PROT 7.2  ALBUMIN 3.3*   Cardiac Enzymes: Recent Labs  Lab 02/01/23 1856  CKTOTAL 57   Urine analysis:    Component Value Date/Time   COLORURINE YELLOW (A) 11/09/2022 1744   APPEARANCEUR HAZY (A) 11/09/2022 1744   LABSPEC 1.025 11/09/2022 1744   PHURINE 5.0 11/09/2022 1744   GLUCOSEU NEGATIVE 11/09/2022 1744   HGBUR NEGATIVE 11/09/2022 1744   BILIRUBINUR NEGATIVE 11/09/2022 1744   BILIRUBINUR neg 11/10/2015 1352   KETONESUR 5 (A) 11/09/2022 1744   PROTEINUR NEGATIVE 11/09/2022 1744   UROBILINOGEN negative 11/10/2015 1352   UROBILINOGEN 0.2 11/20/2013 0201   NITRITE NEGATIVE 11/09/2022 1744   LEUKOCYTESUR SMALL (A) 11/09/2022 1744   This document was prepared using Dragon Voice Recognition software and may include  unintentional dictation errors.  Dr. Tobie Poet [facility]  If 7PM-7AM, please contact overnight-coverage provider If 7AM-7PM, please contact day coverage provider [email or web address]  02/01/2023, 11:37 PM

## 2023-02-01 NOTE — ED Notes (Signed)
ED Provider at bedside. 

## 2023-02-01 NOTE — Assessment & Plan Note (Addendum)
-    serum magnesium level was 2.0 on admission - Status post potassium chloride 40 mEq p.o. one-time dose per EDP - Repeat BMP in the a.m.

## 2023-02-01 NOTE — Assessment & Plan Note (Signed)
-   Alprazolam 0.25 mg nightly as needed for anxiety, 2 doses ordered

## 2023-02-01 NOTE — Assessment & Plan Note (Addendum)
Possible syncope - Query polypharmacy in setting of zolpidem and Flexeril use - Holding home Flexeril at this time - Check serum B12 level - Check UA - Fall precaution

## 2023-02-01 NOTE — Assessment & Plan Note (Signed)
-   Query developing dementia

## 2023-02-01 NOTE — Hospital Course (Addendum)
[name] is a19 year old female with history of atrial fibrillation on Eliquis, hypertension, insomnia, history of bilateral breast cancer with breast implants, who presents emergency department for chief concerns of frequent falls.  Initial vitals in the ED showed temperature of 98.8, respiration rate of 17, heart rate of 106, blood pressure 102/85, SpO2 of 95% on room air.  sNa 141, potassium 3.0, chloride 107, bicarb 23, BUN of 23, serum creatinine of 1.13, nonfasting blood glucose 103, EGFR 50, WBC 6.5, hemoglobin 11.5, platelets of 219.  CK was 53.  High sensitive troponin was 17. COVID/influenza A/influenza B/RSV PCR were negative.  ED treatment: Acetaminophen 1 g p.o., metoprolol 50 mg p.o. one-time dose, potassium chloride 40 mEq once, Tdap booster one-time dose ordered.

## 2023-02-01 NOTE — Assessment & Plan Note (Addendum)
At posterior right scalp, presumed secondary to mechanical fall - Hold home eliquis at this time - Nursing instruction for dry gauze dressing

## 2023-02-01 NOTE — Assessment & Plan Note (Signed)
-   Resumed home zolpidem 5 mg nightly as needed for sleep

## 2023-02-02 ENCOUNTER — Encounter: Payer: Self-pay | Admitting: Internal Medicine

## 2023-02-02 DIAGNOSIS — R296 Repeated falls: Secondary | ICD-10-CM | POA: Diagnosis not present

## 2023-02-02 LAB — CBC
HCT: 33.1 % — ABNORMAL LOW (ref 36.0–46.0)
Hemoglobin: 10.5 g/dL — ABNORMAL LOW (ref 12.0–15.0)
MCH: 30.3 pg (ref 26.0–34.0)
MCHC: 31.7 g/dL (ref 30.0–36.0)
MCV: 95.7 fL (ref 80.0–100.0)
Platelets: 214 10*3/uL (ref 150–400)
RBC: 3.46 MIL/uL — ABNORMAL LOW (ref 3.87–5.11)
RDW: 17.2 % — ABNORMAL HIGH (ref 11.5–15.5)
WBC: 6.7 10*3/uL (ref 4.0–10.5)
nRBC: 0 % (ref 0.0–0.2)

## 2023-02-02 LAB — BASIC METABOLIC PANEL
Anion gap: 9 (ref 5–15)
BUN: 23 mg/dL (ref 8–23)
CO2: 25 mmol/L (ref 22–32)
Calcium: 8.5 mg/dL — ABNORMAL LOW (ref 8.9–10.3)
Chloride: 108 mmol/L (ref 98–111)
Creatinine, Ser: 1.1 mg/dL — ABNORMAL HIGH (ref 0.44–1.00)
GFR, Estimated: 52 mL/min — ABNORMAL LOW (ref >60)
Glucose, Bld: 126 mg/dL — ABNORMAL HIGH (ref 70–99)
Potassium: 3.4 mmol/L — ABNORMAL LOW (ref 3.5–5.1)
Sodium: 142 mmol/L (ref 135–145)

## 2023-02-02 LAB — GLUCOSE, CAPILLARY: Glucose-Capillary: 133 mg/dL — ABNORMAL HIGH (ref 70–99)

## 2023-02-02 LAB — VITAMIN B12: Vitamin B-12: 1369 pg/mL — ABNORMAL HIGH (ref 180–914)

## 2023-02-02 MED ORDER — ENSURE ENLIVE PO LIQD
237.0000 mL | Freq: Two times a day (BID) | ORAL | Status: DC
  Administered 2023-02-02 – 2023-02-03 (×2): 237 mL via ORAL

## 2023-02-02 MED ORDER — ALPRAZOLAM 0.25 MG PO TABS
0.2500 mg | ORAL_TABLET | Freq: Every evening | ORAL | Status: DC | PRN
  Administered 2023-02-02: 0.25 mg via ORAL
  Filled 2023-02-02: qty 1

## 2023-02-02 NOTE — Plan of Care (Signed)
  Problem: Education: Goal: Knowledge of condition and prescribed therapy will improve Outcome: Progressing   Problem: Cardiac: Goal: Will achieve and/or maintain adequate cardiac output Outcome: Progressing   Problem: Education: Goal: Knowledge of General Education information will improve Description: Including pain rating scale, medication(s)/side effects and non-pharmacologic comfort measures Outcome: Progressing   Problem: Health Behavior/Discharge Planning: Goal: Ability to manage health-related needs will improve Outcome: Progressing   Problem: Safety: Goal: Ability to remain free from injury will improve Outcome: Progressing   Problem: Skin Integrity: Goal: Risk for impaired skin integrity will decrease Outcome: Progressing

## 2023-02-02 NOTE — Progress Notes (Signed)
PROGRESS NOTE    Donna Evans  [id number] DOB: 12-21-1945 DOA: 02/01/2023 PCP: Jodi Marble, MD    Brief Narrative:  77 year old female with history of atrial fibrillation on Eliquis, hypertension, insomnia, history of bilateral breast cancer with breast implants, who presents emergency department for chief concerns of frequent falls.   She reports increased falling over the last few days. She denies changes to diet, chest pain, shortness of breath, dysuria, diarrhea, fever. She denies trauma.     Assessment & Plan:   Principal Problem:   Frequent falls Active Problems:   Syncope   Generalized anxiety disorder   Insomnia   S/P cardiac catheterization   Memory change   Imbalance   Bilateral leg weakness   HLD (hyperlipidemia)   Stage 3b chronic kidney disease (CKD) (HCC)   Atrial fibrillation, chronic (HCC)   Hypokalemia   Bleeding  * Frequent falls Possible syncope - Query polypharmacy in setting of zolpidem and Flexeril use - Holding home Flexeril at this time - Check serum B12 level, normal - Check UA, pending - Fall precaution  -PT OT evaluations   Syncope Fall precautions   Bleeding At posterior right scalp, presumed secondary to mechanical fall - Hold home eliquis at this time - Nursing instruction for dry gauze dressing  -Appears stable, can likely restart Eliquis in 24 hours   Hypokalemia -  serum magnesium level was 2.0 on admission - Status post potassium chloride 40 mEq p.o. one-time dose per EDP - Repeat BMP in the a.m., potassium improved   Atrial fibrillation, chronic (HCC) - Apixaban 5 mg p.o. twice daily, holding due to scalp bleeding   Stage 3b chronic kidney disease (CKD) ([place]) - At baseline   Memory change - Query developing dementia   Insomnia - Resumed home zolpidem 5 mg nightly as needed for sleep   Generalized anxiety disorder - Alprazolam 0.25 mg nightly as needed for anxiety   DVT prophylaxis: SCD Code Status:  DNR Family Communication: Close friend 2/21 Disposition Plan: Status is: Observation The patient will require care spanning > 2 midnights and should be moved to inpatient because: Frequent falls.  Unsafe for discharge at this time.  Workup in progress.   Level of care: Telemetry Cardiac  Consultants:  None  Procedures:  None  Antimicrobials: None   Subjective: Seen and examined.  Resting in bed.  Answers all questions appropriately.  Objective: Vitals:   02/02/23 0345 02/02/23 0500 02/02/23 0816 02/02/23 1144  BP: (!) 147/93  (!) 154/92 137/84  Pulse: 93  (!) 110 (!) 55  Resp: 16  17 18  $ Temp: 97.9 F (36.6 C)  97.8 F (36.6 C) (!) 97.4 F (36.3 C)  TempSrc: Oral     SpO2: 94%  96% 96%  Weight:  54.2 kg      Intake/Output Summary (Last 24 hours) at 02/02/2023 1358 Last data filed at 02/02/2023 1026 Gross per 24 hour  Intake 420 ml  Output 300 ml  Net 120 ml   Filed Weights   02/02/23 0500  Weight: 54.2 kg    Examination:  General exam: NAD.  Frail-appearing Respiratory system: Scattered coarse crackles bilaterally.  Normal work of breathing.  Room air Cardiovascular system: S1-S2, RRR, no murmurs, no pedal edema Gastrointestinal system: Thin, soft, NT/ND, normal bowel sounds Central nervous system: Alert and oriented. No focal neurological deficits. Extremities: Symmetric 5 x 5 power. Skin: No rashes, lesions or ulcers Psychiatry: Judgement and insight appear normal. Mood & affect appropriate.  Data Reviewed: I have personally reviewed following labs and imaging studies  CBC: Recent Labs  Lab 02/01/23 1856 02/02/23 0257  WBC 6.5 6.7  NEUTROABS 4.6  --   HGB 11.5* 10.5*  HCT 36.0 33.1*  MCV 95.2 95.7  PLT 219 [id number]   Basic Metabolic Panel: Recent Labs  Lab 02/01/23 1856 02/02/23 0257  NA 141 142  K 3.0* 3.4*  CL 107 108  CO2 23 25  GLUCOSE 103* 126*  BUN 23 23  CREATININE 1.13* 1.10*  CALCIUM 8.7* 8.5*  MG 2.0  --     GFR: Estimated Creatinine Clearance: 37.2 mL/min (A) (by C-G formula based on SCr of 1.1 mg/dL (H)). Liver Function Tests: Recent Labs  Lab 02/01/23 1856  AST 37  ALT 26  ALKPHOS 660*  BILITOT 1.1  PROT 7.2  ALBUMIN 3.3*   No results for input(s): "LIPASE", "AMYLASE" in the last 168 hours. No results for input(s): "AMMONIA" in the last 168 hours. Coagulation Profile: No results for input(s): "INR", "PROTIME" in the last 168 hours. Cardiac Enzymes: Recent Labs  Lab 02/01/23 1856  CKTOTAL 57   BNP (last 3 results) No results for input(s): "PROBNP" in the last 8760 hours. HbA1C: No results for input(s): "HGBA1C" in the last 72 hours. CBG: Recent Labs  Lab 02/02/23 0446  GLUCAP 133*   Lipid Profile: No results for input(s): "CHOL", "HDL", "LDLCALC", "TRIG", "CHOLHDL", "LDLDIRECT" in the last 72 hours. Thyroid Function Tests: No results for input(s): "TSH", "T4TOTAL", "FREET4", "T3FREE", "THYROIDAB" in the last 72 hours. Anemia Panel: Recent Labs    02/01/23 2316  VITAMINB12 [id number]*   Sepsis Labs: Recent Labs  Lab 02/01/23 1927  PROCALCITON <0.10    Recent Results (from the past 240 hour(s))  Resp panel by RT-PCR (RSV, Flu A&B, Covid) Anterior Nasal Swab     Status: None   Collection Time: 02/01/23  7:27 PM   Specimen: Anterior Nasal Swab  Result Value Ref Range Status   SARS Coronavirus 2 by RT PCR NEGATIVE NEGATIVE Final    Comment: (NOTE) SARS-CoV-2 target nucleic acids are NOT DETECTED.  The SARS-CoV-2 RNA is generally detectable in upper respiratory specimens during the acute phase of infection. The lowest concentration of SARS-CoV-2 viral copies this assay can detect is 138 copies/mL. A negative result does not preclude SARS-Cov-2 infection and should not be used as the sole basis for treatment or other patient management decisions. A negative result may occur with  improper specimen collection/handling, submission of specimen other than  nasopharyngeal swab, presence of viral mutation(s) within the areas targeted by this assay, and inadequate number of viral copies(<138 copies/mL). A negative result must be combined with clinical observations, patient history, and epidemiological information. The expected result is Negative.  Fact Sheet for Patients:  EntrepreneurPulse.com.au  Fact Sheet for Healthcare Providers:  IncredibleEmployment.be  This test is no t yet approved or cleared by the [place] FDA and  has been authorized for detection and/or diagnosis of SARS-CoV-2 by FDA under an Emergency Use Authorization (EUA). This EUA will remain  in effect (meaning this test can be used) for the duration of the COVID-19 declaration under Section 564(b)(1) of the Act, 21 U.S.C.section 360bbb-3(b)(1), unless the authorization is terminated  or revoked sooner.       Influenza A by PCR NEGATIVE NEGATIVE Final   Influenza B by PCR NEGATIVE NEGATIVE Final    Comment: (NOTE) The Xpert Xpress SARS-CoV-2/FLU/RSV plus assay is intended as an aid in the diagnosis of  influenza from Nasopharyngeal swab specimens and should not be used as a sole basis for treatment. Nasal washings and aspirates are unacceptable for Xpert Xpress SARS-CoV-2/FLU/RSV testing.  Fact Sheet for Patients: EntrepreneurPulse.com.au  Fact Sheet for Healthcare Providers: IncredibleEmployment.be  This test is not yet approved or cleared by the [place] FDA and has been authorized for detection and/or diagnosis of SARS-CoV-2 by FDA under an Emergency Use Authorization (EUA). This EUA will remain in effect (meaning this test can be used) for the duration of the COVID-19 declaration under Section 564(b)(1) of the Act, 21 U.S.C. section 360bbb-3(b)(1), unless the authorization is terminated or revoked.     Resp Syncytial Virus by PCR NEGATIVE NEGATIVE Final    Comment:  (NOTE) Fact Sheet for Patients: EntrepreneurPulse.com.au  Fact Sheet for Healthcare Providers: IncredibleEmployment.be  This test is not yet approved or cleared by the [place] FDA and has been authorized for detection and/or diagnosis of SARS-CoV-2 by FDA under an Emergency Use Authorization (EUA). This EUA will remain in effect (meaning this test can be used) for the duration of the COVID-19 declaration under Section 564(b)(1) of the Act, 21 U.S.C. section 360bbb-3(b)(1), unless the authorization is terminated or revoked.  Performed at [facility], [address], [place], [place] [address]          Radiology Studies: DG Chest Portable 1 View  Result Date: 02/01/2023 CLINICAL DATA:  Weakness EXAM: PORTABLE CHEST 1 VIEW COMPARISON:  11/12/2022 x-ray FINDINGS: Overlapping cardiac leads. Calcified aorta. Normal cardiopericardial silhouette. Underinflation. Interstitial changes are stable and likely chronic. No consolidation, pneumothorax or effusion. Surgical clips in the right upper quadrant of the abdomen. Chronic appearing deformity of the left proximal humerus. IMPRESSION: Underinflation with chronic interstitial changes. No acute cardiopulmonary process identified. Electronically Signed   By: Jill Side M.D.   On: 02/01/2023 19:08   CT Cervical Spine Wo Contrast  Result Date: 02/01/2023 CLINICAL DATA:  [place] EXAM: CT CERVICAL SPINE WITHOUT CONTRAST TECHNIQUE: Multidetector CT imaging of the cervical spine was performed without intravenous contrast. Multiplanar CT image reconstructions were also generated. RADIATION DOSE REDUCTION: This exam was performed according to the departmental dose-optimization program which includes automated exposure control, adjustment of the mA and/or kV according to patient size and/or use of iterative reconstruction technique. COMPARISON:  11/09/2022, 11/01/2022 FINDINGS: Alignment: Alignment is  grossly anatomic. Skull base and vertebrae: No acute fracture. Age indeterminate mild compression deformity superior endplate T4 vertebral body, new since the 11/01/2022 exam, with less than 10% loss of vertebral body height. Soft tissues and spinal canal: No prevertebral fluid or swelling. No visible canal hematoma. Disc levels: Stable multilevel spondylosis and facet hypertrophy, most pronounced from C4-5 through C6-7. Upper chest: Airways patent. Emphysematous changes at the lung apices. Small left pleural effusion. Other: Reconstructed images demonstrate no additional findings. IMPRESSION: 1. No acute cervical spine fracture. 2. Age-indeterminate mild compression deformity superior endplate T4 vertebral body, with less than 10% loss of vertebral body height. This is new since the 11/01/2022 exam, but otherwise age indeterminate. 3. Stable multilevel cervical degenerative changes. 4. Small left pleural effusion. 5. Emphysema. Electronically Signed   By: Randa Ngo M.D.   On: 02/01/2023 17:18   CT HEAD WO CONTRAST (5MM)  Result Date: 02/01/2023 CLINICAL DATA:  Dizziness, fell, anticoagulated EXAM: CT HEAD WITHOUT CONTRAST TECHNIQUE: Contiguous axial images were obtained from the base of the skull through the vertex without intravenous contrast. RADIATION DOSE REDUCTION: This exam was performed according to the departmental dose-optimization program  which includes automated exposure control, adjustment of the mA and/or kV according to patient size and/or use of iterative reconstruction technique. COMPARISON:  11/09/2022 FINDINGS: Brain: No acute infarct or hemorrhage. Lateral ventricles and midline structures are unremarkable. No acute extra-axial fluid collections. No mass effect. Vascular: No hyperdense vessel or unexpected calcification. Skull: Large right parietal scalp hematoma. No underlying fracture. The remainder of the calvarium is unremarkable. Sinuses/Orbits: Retained secretions within the right  sphenoid sinus. Remaining paranasal sinuses are clear. Other: None. IMPRESSION: 1. Large right parietal scalp hematoma.  No underlying fracture. 2. No acute intracranial process. Electronically Signed   By: Randa Ngo M.D.   On: 02/01/2023 17:13        Scheduled Meds:  feeding supplement  237 mL Oral BID BM   folic acid  1 mg Oral Daily   metoprolol tartrate  25 mg Oral BID   sodium chloride flush  3 mL Intravenous Q12H   Continuous Infusions:   LOS: 0 days       Sidney Ace, MD [facility]   If 7PM-7AM, please contact night-coverage  02/02/2023, 1:58 PM

## 2023-02-02 NOTE — TOC CM/SW Note (Signed)
Patient is active with [facility] for PT, OT, RN, aide. CSW will continue to follow progress and notify liaison when she is stable for discharge.  Donna Evans, [place]

## 2023-02-02 NOTE — Care Management Obs Status (Signed)
[facility] NOTIFICATION   Patient Details  Name: Donna Evans MRN: [id number] Date of Birth: 12/21/1945   Medicare Observation Status Notification Given:  Yes    Candie Chroman, LCSW 02/02/2023, 2:21 PM

## 2023-02-02 NOTE — Evaluation (Signed)
Occupational Therapy Evaluation Patient Details Name: Donna Evans MRN: [id number] DOB: [birth year] Today's Date: 02/02/2023   History of Present Illness Senna Pech is a17 year old female with history of atrial fibrillation on Eliquis, hypertension, insomnia, history of bilateral breast cancer with breast implants, who presents emergency department for chief concerns of frequent falls   Clinical Impression   Donna Evans was seen for OT/PT co-evaluation this date. Prior to hospital admission, pt was MOD I using rollator, endorses 5 falls this month. Pt lives alone. Pt presents to acute OT demonstrating impaired ADL performance and functional mobility 2/2 decreased activity tolerance. Pt currently requires no physical assist for bed mobility, MIN cues for positioning. MOD I don/doff socks seated EOB. CGA + rollator for functional mobility ~100 ft. Pt would benefit from skilled OT to address noted impairments and functional limitations (see below for any additional details). Upon hospital discharge, recommend HHOT to maximize pt safety and return to PLOF.   Orthostatic VS for the past 24 hrs:  BP- Lying Pulse- Lying BP- Sitting Pulse- Sitting BP- Standing at 0 minutes Pulse- Standing at 0 minutes  02/02/23 0847 (!) 140/105 92 (!) 140/105 (!) 47 (!) 80/57 98    Of note, pt denies dizziness in standing despite positive orthostatics.    Recommendations for follow up therapy are one component of a multi-disciplinary discharge planning process, led by the attending physician.  Recommendations may be updated based on patient status, additional functional criteria and insurance authorization.   Follow Up Recommendations  Home health OT     Assistance Recommended at Discharge Intermittent Supervision/Assistance  Patient can return home with the following A little help with bathing/dressing/bathroom;Help with stairs or ramp for entrance;Direct supervision/assist for medications management     Functional Status Assessment  Patient has had a recent decline in their functional status and demonstrates the ability to make significant improvements in function in a reasonable and predictable amount of time.  Equipment Recommendations  None recommended by OT    Recommendations for Other Services       Precautions / Restrictions Precautions Precautions: Fall Restrictions Weight Bearing Restrictions: No      Mobility Bed Mobility Overal bed mobility: Modified Independent                  Transfers Overall transfer level: Needs assistance Equipment used: Rollator (4 wheels) Transfers: Sit to/from Stand Sit to Stand: Min guard, Min assist           General transfer comment: initial stand MIN A, improves to CGA second trial      Balance Overall balance assessment: Needs assistance Sitting-balance support: No upper extremity supported, Feet supported Sitting balance-Leahy Scale: Good     Standing balance support: During functional activity, Single extremity supported Standing balance-Leahy Scale: Fair                             ADL either performed or assessed with clinical judgement   ADL Overall ADL's : Needs assistance/impaired                                       General ADL Comments: MOD I don/doff socks seated EOB. CGA + rollator for functional mobility ~100 ft.      Pertinent Vitals/Pain Pain Assessment Pain Assessment: No/denies pain     Hand Dominance Right   Extremity/Trunk Assessment  Upper Extremity Assessment Upper Extremity Assessment: Overall WFL for tasks assessed   Lower Extremity Assessment Lower Extremity Assessment: Generalized weakness       Communication Communication Communication: No difficulties   Cognition Arousal/Alertness: Awake/alert Behavior During Therapy: WFL for tasks assessed/performed Overall Cognitive Status: History of cognitive impairments - at baseline                                                   Home Living Family/patient expects to be discharged to:: Private residence Living Arrangements: Other relatives Available Help at Discharge: Available PRN/intermittently;Friend(s);Home health Type of Home: House Home Access: Stairs to enter Entrance Stairs-Number of Steps: 1 Entrance Stairs-Rails: None Home Layout: One level     Bathroom Shower/Tub: Occupational psychologist: Handicapped height     Home Equipment: Shower seat;Grab bars - toilet;Rollator (4 wheels);Cane - single Barista (2 wheels)   Additional Comments: reports HHA 1 day/week      Prior Functioning/Environment Prior Level of Function : Needs assist;History of Falls (last six months)             Mobility Comments: endorses 5 falls this month          OT Problem List: Decreased strength;Decreased range of motion;Impaired balance (sitting and/or standing);Decreased activity tolerance      OT Treatment/Interventions: Self-care/ADL training;Therapeutic exercise;Energy conservation;DME and/or AE instruction;Therapeutic activities;Balance training;Patient/family education    OT Goals(Current goals can be found in the care plan section) Acute Rehab OT Goals Patient Stated Goal: to go home OT Goal Formulation: With patient Time For Goal Achievement: 02/16/23 Potential to Achieve Goals: Good ADL Goals Pt Will Perform Grooming: with modified independence;standing Pt Will Perform Lower Body Dressing: with modified independence;sit to/from stand Pt Will Transfer to Toilet: with modified independence;ambulating;regular height toilet Additional ADL Goal #1: Pt will complete pill box test with a passing score  OT Frequency: Min 2X/week    Co-evaluation              AM-PAC OT "6 Clicks" Daily Activity     Outcome Measure Help from another person eating meals?: None Help from another person taking care of personal grooming?: A  Little Help from another person toileting, which includes using toliet, bedpan, or urinal?: A Little Help from another person bathing (including washing, rinsing, drying)?: A Little Help from another person to put on and taking off regular upper body clothing?: None Help from another person to put on and taking off regular lower body clothing?: None 6 Click Score: 21   End of Session Equipment Utilized During Treatment: Rollator (4 wheels)  Activity Tolerance: Patient tolerated treatment well Patient left: in bed;with call bell/phone within reach;with bed alarm set  OT Visit Diagnosis: Repeated falls (R29.6);History of falling (Z91.81)                Time: [id number] OT Time Calculation (min): 29 min Charges:  OT General Charges $OT Visit: 1 Visit OT Evaluation $OT Eval Low Complexity: 1 Low OT Treatments $Self Care/Home Management : 8-22 mins  Dessie Coma, M.S. OTR/L  02/02/23, 9:36 AM  ascom [phone]

## 2023-02-02 NOTE — Evaluation (Signed)
Physical Therapy Evaluation Patient Details Name: Donna Evans MRN: [id number] DOB: 1946-07-07 Today's Date: 02/02/2023  History of Present Illness  Donna Evans is a61 year old female with history of atrial fibrillation on Eliquis, hypertension, insomnia, history of bilateral breast cancer with breast implants, who presents emergency department for chief concerns of frequent falls  Clinical Impression  Patient is agreeable to PT. She reports multiple recent falls at home. She ambulates with a 4 wheeled walker which she has in the room with her today.  The patient required Min A with initial standing bout, otherwise was only Min guard assistance for mobility. Despite decrease in blood pressure with standing that was taken during orthostatics, the patient reports no dizziness with standing activity. She walked in the hallway with her own walker with no loss of balance. Activity tolerance is limited by fatigue. The patient was adamant about not going to any rehab facilities at discharge. Recommend HHPT with intermittent supervision/assistance from family/friends as needed.      Recommendations for follow up therapy are one component of a multi-disciplinary discharge planning process, led by the attending physician.  Recommendations may be updated based on patient status, additional functional criteria and insurance authorization.  Follow Up Recommendations Home health PT      Assistance Recommended at Discharge Intermittent Supervision/Assistance  Patient can return home with the following  A little help with walking and/or transfers;A little help with bathing/dressing/bathroom;Help with stairs or ramp for entrance;Assist for transportation    Equipment Recommendations None recommended by PT  Recommendations for Other Services       Functional Status Assessment Patient has had a recent decline in their functional status and demonstrates the ability to make significant improvements in  function in a reasonable and predictable amount of time.     Precautions / Restrictions Precautions Precautions: Fall Restrictions Weight Bearing Restrictions: No      Mobility  Bed Mobility Overal bed mobility: Modified Independent                  Transfers Overall transfer level: Needs assistance Equipment used: Rollator (4 wheels)   Sit to Stand: Min guard, Min assist           General transfer comment: lifting assistance required for standing during the first stand. Min gaurd for second stand    Ambulation/Gait Ambulation/Gait assistance: Min Conservator, museum/gallery (Feet): 100 Feet Assistive device: Rollator (4 wheels) Gait Pattern/deviations: Step-through pattern, Trunk flexed Gait velocity: decreased     General Gait Details: encouraged patient to use the seat on the 4 wheeled walker at home as needed for fatigue, dizziness, for fall prevention. no dizziness reported with ambulation today. heart rate in the 120's  Stairs            Wheelchair Mobility    Modified Rankin (Stroke Patients Only)       Balance Overall balance assessment: Needs assistance, History of Falls Sitting-balance support: No upper extremity supported Sitting balance-Leahy Scale: Good     Standing balance support: During functional activity Standing balance-Leahy Scale: Fair                               Pertinent Vitals/Pain Pain Assessment Pain Assessment: No/denies pain    Home Living Family/patient expects to be discharged to:: Private residence Living Arrangements: Other relatives Available Help at Discharge: Available PRN/intermittently;Friend(s);Home health Type of Home: House Home Access: Stairs to enter Entrance Stairs-Rails: None Entrance [employer]  of Steps: 1   Home Layout: One level Home Equipment: Shower seat;Grab bars - toilet;Rollator (4 wheels);Cane - single Barista (2 wheels) Additional Comments: reports HHA 1  day/week    Prior Function Prior Level of Function : Needs assist;History of Falls (last six months)             Mobility Comments: endorses 5 falls this month ADLs Comments: Mod I     Hand Dominance   Dominant Hand: Right    Extremity/Trunk Assessment   Upper Extremity Assessment Upper Extremity Assessment: Overall WFL for tasks assessed    Lower Extremity Assessment Lower Extremity Assessment: Generalized weakness       Communication   Communication: No difficulties  Cognition Arousal/Alertness: Awake/alert Behavior During Therapy: WFL for tasks assessed/performed Overall Cognitive Status: History of cognitive impairments - at baseline                                 General Comments: patient is able to follow all commands with increased time        General Comments General comments (skin integrity, edema, etc.): orthostatic vitals taken during session. patient reports mild dizziness (chronic) initially with sitting that subsides. despite drop in blood pressure in standing, patient reports no dizziness in standing postion. activity tolerance limited by fatigue    Exercises     Assessment/Plan    PT Assessment Patient needs continued PT services  PT Problem List Decreased strength;Decreased activity tolerance;Decreased balance;Decreased mobility       PT Treatment Interventions DME instruction;Gait training;Stair training;Functional mobility training;Therapeutic activities;Therapeutic exercise;Neuromuscular re-education;Balance training;Cognitive remediation;Patient/family education    PT Goals (Current goals can be found in the Care Plan section)  Acute Rehab PT Goals Patient Stated Goal: to go home PT Goal Formulation: With patient Time For Goal Achievement: 02/16/23 Potential to Achieve Goals: Fair    Frequency Min 2X/week     Co-evaluation PT/OT/SLP Co-Evaluation/Treatment: Yes Reason for Co-Treatment: To address functional/ADL  transfers;For patient/therapist safety PT goals addressed during session: Mobility/safety with mobility         AM-PAC PT "6 Clicks" Mobility  Outcome Measure Help needed turning from your back to your side while in a flat bed without using bedrails?: A Little Help needed moving from lying on your back to sitting on the side of a flat bed without using bedrails?: A Little Help needed moving to and from a bed to a chair (including a wheelchair)?: A Little Help needed standing up from a chair using your arms (e.g., wheelchair or bedside chair)?: A Little Help needed to walk in hospital room?: A Little Help needed climbing 3-5 steps with a railing? : A Little 6 Click Score: 18    End of Session   Activity Tolerance: Patient tolerated treatment well Patient left: in bed;with call bell/phone within reach;with bed alarm set Nurse Communication: Mobility status PT Visit Diagnosis: Muscle weakness (generalized) (M62.81);History of falling (Z91.81)    Time: [id number] PT Time Calculation (min) (ACUTE ONLY): 25 min   Charges:   PT Evaluation $PT Eval Low Complexity: 1 Low PT Treatments $Therapeutic Activity: 8-22 mins        Minna Merritts, PT, MPT   Percell Locus 02/02/2023, 11:47 AM

## 2023-02-02 NOTE — Plan of Care (Signed)
  Problem: Education: Goal: Knowledge of condition and prescribed therapy will improve Outcome: Progressing   Problem: Cardiac: Goal: Will achieve and/or maintain adequate cardiac output Outcome: Progressing   Problem: Physical Regulation: Goal: Complications related to the disease process, condition or treatment will be avoided or minimized Outcome: Progressing   Problem: Education: Goal: Knowledge of General Education information will improve Description: Including pain rating scale, medication(s)/side effects and non-pharmacologic comfort measures Outcome: Progressing   Problem: Health Behavior/Discharge Planning: Goal: Ability to manage health-related needs will improve Outcome: Progressing   Problem: Clinical Measurements: Goal: Ability to maintain clinical measurements within normal limits will improve Outcome: Progressing Goal: Will remain free from infection Outcome: Progressing Goal: Diagnostic test results will improve Outcome: Progressing Goal: Respiratory complications will improve Outcome: Progressing Goal: Cardiovascular complication will be avoided Outcome: Progressing

## 2023-02-03 DIAGNOSIS — R296 Repeated falls: Secondary | ICD-10-CM | POA: Diagnosis not present

## 2023-02-03 LAB — GLUCOSE, CAPILLARY: Glucose-Capillary: 96 mg/dL (ref 70–99)

## 2023-02-03 MED ORDER — ENSURE ENLIVE PO LIQD: 237.0000 mL | Freq: Two times a day (BID) | ORAL | 12 refills | Status: DC

## 2023-02-03 MED ORDER — METOPROLOL TARTRATE 25 MG PO TABS: 25.0000 mg | ORAL_TABLET | Freq: Two times a day (BID) | ORAL | Status: DC

## 2023-02-03 NOTE — Progress Notes (Signed)
Physical Therapy Treatment Patient Details Name: Donna Evans MRN: [id number] DOB: 1946/05/07 Today's Date: 02/03/2023   History of Present Illness Donna Evans is a72 year old female with history of atrial fibrillation on Eliquis, hypertension, insomnia, history of bilateral breast cancer with breast implants, who presents emergency department for chief concerns of frequent falls    PT Comments    Patient eagerly anticipating D/C home with [facility] services today. Patient found semi reclined in bed on room air. Patient then transferred to edge of bed with modified independence and increased time to complete task due to assistance required for line management. Patient requested to use restroom to void. Patient stood with contact guard/min guard assist with cues to increase safety while using rollator. Patient then ambulated to bathroom with rollator and contact guard assist. Patient was able to void and performed standing pericare with supervision. Patient then navigated busy room environment with rollator, patient requested to defer hallway ambulation to sit in recliner and wait for lunch to arrive. Discussed with patient at length regarding home safety recommendations and encouraged patient to continue to work with [facility] therapy upon D/C home. PT will continue to follow at a low frequency if she remains in house.     Recommendations for follow up therapy are one component of a multi-disciplinary discharge planning process, led by the attending physician.  Recommendations may be updated based on patient status, additional functional criteria and insurance authorization.  Follow Up Recommendations  Home health PT     Assistance Recommended at Discharge Intermittent Supervision/Assistance  Patient can return home with the following A little help with walking and/or transfers;A little help with bathing/dressing/bathroom;Help with stairs or ramp for entrance;Assist for transportation   Equipment  Recommendations  None recommended by PT (has rollator available at home)    Recommendations for Other Services       Precautions / Restrictions Precautions Precautions: Fall Restrictions Weight Bearing Restrictions: No     Mobility  Bed Mobility Overal bed mobility: Modified Independent             General bed mobility comments: assist with line management    Transfers Overall transfer level: Needs assistance Equipment used: Rollator (4 wheels) Transfers: Sit to/from Stand Sit to Stand: Min guard           General transfer comment: STS min guard with cues for hand placement on bed    Ambulation/Gait Ambulation/Gait assistance: Min guard Gait Distance (Feet): 30 Feet Assistive device: Rollator (4 wheels) Gait Pattern/deviations: Step-through pattern, Trunk flexed Gait velocity: decreased     General Gait Details: cues for appropriate use of rollator brakes and seat to decrease falls risk   Stairs             Wheelchair Mobility    Modified Rankin (Stroke Patients Only)       Balance Overall balance assessment: Needs assistance Sitting-balance support: No upper extremity supported, Feet supported Sitting balance-Leahy Scale: Good Sitting balance - Comments: forward flexed posture in sitting   Standing balance support: During functional activity, Reliant on assistive device for balance Standing balance-Leahy Scale: Fair Standing balance comment: BUE reliance on rollator during ambulation                            Cognition Arousal/Alertness: Awake/alert Behavior During Therapy: WFL for tasks assessed/performed  Exercises General Exercises - Lower Extremity Ankle Circles/Pumps: AROM, Strengthening, 10 reps, Supine, Both Long Arc Quad: AROM, Both, 10 reps, Seated, Strengthening    General Comments        Pertinent Vitals/Pain Pain Assessment Pain Assessment:  No/denies pain    Home Living                          Prior Function            PT Goals (current goals can now be found in the care plan section) Acute Rehab PT Goals Patient Stated Goal: to go home PT Goal Formulation: With patient Time For Goal Achievement: 02/16/23 Potential to Achieve Goals: Fair Progress towards PT goals: Progressing toward goals    Frequency    Min 2X/week      PT Plan Current plan remains appropriate    Co-evaluation              AM-PAC PT "6 Clicks" Mobility   Outcome Measure  Help needed turning from your back to your side while in a flat bed without using bedrails?: A Little Help needed moving from lying on your back to sitting on the side of a flat bed without using bedrails?: A Little Help needed moving to and from a bed to a chair (including a wheelchair)?: A Little Help needed standing up from a chair using your arms (e.g., wheelchair or bedside chair)?: A Little Help needed to walk in hospital room?: A Little Help needed climbing 3-5 steps with a railing? : A Little 6 Click Score: 18    End of Session   Activity Tolerance: Patient tolerated treatment well Patient left: in chair;with call bell/phone within reach (LE's elevated) Nurse Communication: Mobility status PT Visit Diagnosis: Muscle weakness (generalized) (M62.81);History of falling (Z91.81)     Time: [id number] PT Time Calculation (min) (ACUTE ONLY): 25 min  Charges:  $Therapeutic Activity: 23-37 mins                     Clemetine Marker, PT, DPT, CCS, GCS    Derrell Lolling 02/03/2023, 12:14 PM

## 2023-02-03 NOTE — Discharge Summary (Signed)
Physician Discharge Summary  Donna Evans [id number] DOB: 1946-04-10 DOA: 02/01/2023  PCP: Jodi Marble, MD  Admit date: 02/01/2023 Discharge date: 02/03/2023  Admitted From: Home  Disposition:  Home with home health  Recommendations for Outpatient Follow-up:  Follow up with PCP in 1-2 weeks   Home Health: Yes PT OT RN Equipment/Devices: Rear wheel walker  Discharge Condition:Stable CODE STATUS:DNR  Diet recommendation: Reg  Brief/Interim Summary:  77 year old female with history of atrial fibrillation on Eliquis, hypertension, insomnia, history of bilateral breast cancer with breast implants, who presents emergency department for chief concerns of frequent falls.    She reports increased falling over the last few days. She denies changes to diet, chest pain, shortness of breath, dysuria, diarrhea, fever. She denies trauma.    Suspected etiology of patient's frequent falls/syncopal events are concomitant use of of Ambien with benzodiazepines and alcohol.  Patient also appears to be mildly protein calorie malnourished.  May be also dehydrated chronically.  Recommend discontinuation of Ambien.  Recommend protein supplementation with Ensure high-protein.  Recommend adequate hydration.  Follow-up with PCP within 1 to 2 weeks.   Discharge Diagnoses:  Principal Problem:   Frequent falls Active Problems:   Syncope   Generalized anxiety disorder   Insomnia   S/P cardiac catheterization   Memory change   Imbalance   Bilateral leg weakness   HLD (hyperlipidemia)   Stage 3b chronic kidney disease (CKD) (HCC)   Atrial fibrillation, chronic (HCC)   Hypokalemia   Bleeding  * Frequent falls Possible syncope I suspect polypharmacy in the setting of zolpidem and Flexeril use.  Held home Flexeril while in house.  B12, UA normal.  At time of discharge recommend holding zolpidem indefinitely.  Flexeril can be considered or possibly another agent.  Follow-up with PCP.  Consider  alcohol cessation as well.  Syncope Home health PT   Bleeding At posterior right scalp, presumed secondary to mechanical fall Hold home Eliquis in house.  Dry gauze dressing in place.  Hemoglobin stable, can restart Eliquis   Hypokalemia Monitor and replace   Atrial fibrillation, chronic (HCC) - Apixaban 5 mg p.o. twice daily, resume at time of discharge  Stage 3b chronic kidney disease (CKD) ([place]) - At baseline   Memory change - Query developing dementia   Insomnia Recommend discontinuation of zolpidem   Generalized anxiety disorder - Alprazolam 0.25 mg nightly as needed for anxiety  Discharge Instructions  Discharge Instructions     Diet - low sodium heart healthy   Complete by: As directed    Increase activity slowly   Complete by: As directed       Allergies as of 02/03/2023       Reactions   Ciprofloxacin Nausea And Vomiting, Hives   Codeine Anaphylaxis, Hives, Other (See Comments)   Reaction:  Unknown   Fluconazole Hives   Latex Anaphylaxis, Rash   Meperidine Anaphylaxis, Hives   Reaction:  Unknown Reaction:  Unknown   Unknown   Morphine And Related Hives, Anaphylaxis, Other (See Comments)   Reaction:  Unknown   unknown  Other reaction(s): Other (See Comments)  unknown   Doxycycline Nausea And Vomiting   Flagyl [metronidazole] Nausea And Vomiting   Reaction:  Unknown    Influenza Vaccines Swelling   Localized swelling   Tape Other (See Comments)   Pt allergic to Adhesive tape and latex.(  Skin breaks out)   Valtrex [valacyclovir Hcl] Hives   Buprenorphine    Other reaction(s): Other (see comments) unknown  Duloxetine Other (See Comments)   Other Reaction(s): GI intolerance Reaction:  Unknown     Reaction:  Unknown     Reaction:  Unknown   Silicone Other (See Comments)   Pt allergic to Adhesive tape and latex.(  Skin breaks out)   Tramadol    Other reaction(s): Not available   Amoxicillin-pot Clavulanate Nausea And Vomiting    Sulfamethoxazole-trimethoprim Nausea And Vomiting, Rash   Valacyclovir Rash        Medication List     STOP taking these medications    zolpidem 10 MG tablet Commonly known as: AMBIEN       TAKE these medications    ALPRAZolam 1 MG tablet Commonly known as: XANAX Take 1 mg by mouth at bedtime.   alum & mag hydroxide-simeth 200-200-20 MG/5ML suspension Commonly known as: MAALOX/MYLANTA Take 15 mLs by mouth every 6 (six) hours as needed for indigestion or heartburn.   amiodarone 200 MG tablet Commonly known as: PACERONE Take 1 tablet (200 mg total) by mouth 2 (two) times daily for 12 days, THEN 1 tablet (200 mg total) daily. Start taking on: November 18, 2022 What changed: See the new instructions.   apixaban 5 MG Tabs tablet Commonly known as: ELIQUIS Take 1 tablet (5 mg total) by mouth 2 (two) times daily.   butorphanol 10 MG/ML nasal spray Commonly known as: STADOL Place 1 spray into the nose every 4 (four) hours as needed.   calcium carbonate 500 MG chewable tablet Commonly known as: TUMS - dosed in mg elemental calcium Chew 1 tablet by mouth 3 (three) times daily as needed for indigestion or heartburn.   cyclobenzaprine 10 MG tablet Commonly known as: FLEXERIL Take 10 mg by mouth 3 (three) times daily.   feeding supplement Liqd Take 237 mLs by mouth 2 (two) times daily between meals.   folic acid 1 MG tablet Commonly known as: FOLVITE Take 1 tablet (1 mg total) by mouth daily.   lidocaine 5 % Commonly known as: LIDODERM Place 1 patch onto the skin daily. Remove & Discard patch within 12 hours or as directed by MD. Apply to back.   metoprolol tartrate 25 MG tablet Commonly known as: LOPRESSOR Take 1 tablet (25 mg total) by mouth 2 (two) times daily. What changed:  medication strength how much to take when to take this   midodrine 2.5 MG tablet Commonly known as: PROAMATINE Take 1 tablet (2.5 mg total) by mouth 3 (three) times daily with  meals. What changed: when to take this   Vitamin D (Ergocalciferol) 50000 units Caps Take 1 capsule by mouth once a week.        Allergies  Allergen Reactions   Ciprofloxacin Nausea And Vomiting and Hives   Codeine Anaphylaxis, Hives and Other (See Comments)    Reaction:  Unknown   Fluconazole Hives   Latex Anaphylaxis and Rash   Meperidine Anaphylaxis and Hives    Reaction:  Unknown  Reaction:  Unknown   Unknown   Morphine And Related Hives, Anaphylaxis and Other (See Comments)    Reaction:  Unknown   unknown  Other reaction(s): Other (See Comments)  unknown   Doxycycline Nausea And Vomiting   Flagyl [Metronidazole] Nausea And Vomiting    Reaction:  Unknown    Influenza Vaccines Swelling    Localized swelling   Tape Other (See Comments)    Pt allergic to Adhesive tape and latex.(  Skin breaks out)   Valtrex [Valacyclovir Hcl] Hives   Buprenorphine  Other reaction(s): Other (see comments) unknown   Duloxetine Other (See Comments)    Other Reaction(s): GI intolerance  Reaction:  Unknown     Reaction:  Unknown     Reaction:  Unknown   Silicone Other (See Comments)    Pt allergic to Adhesive tape and latex.(  Skin breaks out)   Tramadol     Other reaction(s): Not available   Amoxicillin-Pot Clavulanate Nausea And Vomiting   Sulfamethoxazole-Trimethoprim Nausea And Vomiting and Rash   Valacyclovir Rash    Consultations: None   Procedures/Studies: DG Chest Portable 1 View  Result Date: 02/01/2023 CLINICAL DATA:  Weakness EXAM: PORTABLE CHEST 1 VIEW COMPARISON:  11/12/2022 x-ray FINDINGS: Overlapping cardiac leads. Calcified aorta. Normal cardiopericardial silhouette. Underinflation. Interstitial changes are stable and likely chronic. No consolidation, pneumothorax or effusion. Surgical clips in the right upper quadrant of the abdomen. Chronic appearing deformity of the left proximal humerus. IMPRESSION: Underinflation with chronic interstitial changes. No  acute cardiopulmonary process identified. Electronically Signed   By: Jill Side M.D.   On: 02/01/2023 19:08   CT Cervical Spine Wo Contrast  Result Date: 02/01/2023 CLINICAL DATA:  [place] EXAM: CT CERVICAL SPINE WITHOUT CONTRAST TECHNIQUE: Multidetector CT imaging of the cervical spine was performed without intravenous contrast. Multiplanar CT image reconstructions were also generated. RADIATION DOSE REDUCTION: This exam was performed according to the departmental dose-optimization program which includes automated exposure control, adjustment of the mA and/or kV according to patient size and/or use of iterative reconstruction technique. COMPARISON:  11/09/2022, 11/01/2022 FINDINGS: Alignment: Alignment is grossly anatomic. Skull base and vertebrae: No acute fracture. Age indeterminate mild compression deformity superior endplate T4 vertebral body, new since the 11/01/2022 exam, with less than 10% loss of vertebral body height. Soft tissues and spinal canal: No prevertebral fluid or swelling. No visible canal hematoma. Disc levels: Stable multilevel spondylosis and facet hypertrophy, most pronounced from C4-5 through C6-7. Upper chest: Airways patent. Emphysematous changes at the lung apices. Small left pleural effusion. Other: Reconstructed images demonstrate no additional findings. IMPRESSION: 1. No acute cervical spine fracture. 2. Age-indeterminate mild compression deformity superior endplate T4 vertebral body, with less than 10% loss of vertebral body height. This is new since the 11/01/2022 exam, but otherwise age indeterminate. 3. Stable multilevel cervical degenerative changes. 4. Small left pleural effusion. 5. Emphysema. Electronically Signed   By: Randa Ngo M.D.   On: 02/01/2023 17:18   CT HEAD WO CONTRAST (5MM)  Result Date: 02/01/2023 CLINICAL DATA:  Dizziness, fell, anticoagulated EXAM: CT HEAD WITHOUT CONTRAST TECHNIQUE: Contiguous axial images were obtained from the base of the skull  through the vertex without intravenous contrast. RADIATION DOSE REDUCTION: This exam was performed according to the departmental dose-optimization program which includes automated exposure control, adjustment of the mA and/or kV according to patient size and/or use of iterative reconstruction technique. COMPARISON:  11/09/2022 FINDINGS: Brain: No acute infarct or hemorrhage. Lateral ventricles and midline structures are unremarkable. No acute extra-axial fluid collections. No mass effect. Vascular: No hyperdense vessel or unexpected calcification. Skull: Large right parietal scalp hematoma. No underlying fracture. The remainder of the calvarium is unremarkable. Sinuses/Orbits: Retained secretions within the right sphenoid sinus. Remaining paranasal sinuses are clear. Other: None. IMPRESSION: 1. Large right parietal scalp hematoma.  No underlying fracture. 2. No acute intracranial process. Electronically Signed   By: Randa Ngo M.D.   On: 02/01/2023 17:13      Subjective: Seen and examined on the day of discharge.  Stable no distress.  Stable  for discharge home.  Discharge Exam: Vitals:   02/03/23 0823 02/03/23 1138  BP: (!) 158/53 (!) 147/52  Pulse: 62 62  Resp: 15 16  Temp: 97.8 F (36.6 C) 98.2 F (36.8 C)  SpO2: 93% 97%   Vitals:   02/03/23 0400 02/03/23 0500 02/03/23 0823 02/03/23 1138  BP: (!) 155/73  (!) 158/53 (!) 147/52  Pulse: (!) 58  62 62  Resp: 16  15 16  $ Temp: 97.9 F (36.6 C)  97.8 F (36.6 C) 98.2 F (36.8 C)  TempSrc: Axillary  Oral Oral  SpO2: 94%  93% 97%  Weight:  56.4 kg      General: Pt is alert, awake, not in acute distress Cardiovascular: RRR, S1/S2 +, no rubs, no gallops Respiratory: CTA bilaterally, no wheezing, no rhonchi Abdominal: Soft, NT, ND, bowel sounds + Extremities: no edema, no cyanosis    The results of significant diagnostics from this hospitalization (including imaging, microbiology, ancillary and laboratory) are listed below for  reference.     Microbiology: Recent Results (from the past 240 hour(s))  Resp panel by RT-PCR (RSV, Flu A&B, Covid) Anterior Nasal Swab     Status: None   Collection Time: 02/01/23  7:27 PM   Specimen: Anterior Nasal Swab  Result Value Ref Range Status   SARS Coronavirus 2 by RT PCR NEGATIVE NEGATIVE Final    Comment: (NOTE) SARS-CoV-2 target nucleic acids are NOT DETECTED.  The SARS-CoV-2 RNA is generally detectable in upper respiratory specimens during the acute phase of infection. The lowest concentration of SARS-CoV-2 viral copies this assay can detect is 138 copies/mL. A negative result does not preclude SARS-Cov-2 infection and should not be used as the sole basis for treatment or other patient management decisions. A negative result may occur with  improper specimen collection/handling, submission of specimen other than nasopharyngeal swab, presence of viral mutation(s) within the areas targeted by this assay, and inadequate number of viral copies(<138 copies/mL). A negative result must be combined with clinical observations, patient history, and epidemiological information. The expected result is Negative.  Fact Sheet for Patients:  EntrepreneurPulse.com.au  Fact Sheet for Healthcare Providers:  IncredibleEmployment.be  This test is no t yet approved or cleared by the [place] FDA and  has been authorized for detection and/or diagnosis of SARS-CoV-2 by FDA under an Emergency Use Authorization (EUA). This EUA will remain  in effect (meaning this test can be used) for the duration of the COVID-19 declaration under Section 564(b)(1) of the Act, 21 U.S.C.section 360bbb-3(b)(1), unless the authorization is terminated  or revoked sooner.       Influenza A by PCR NEGATIVE NEGATIVE Final   Influenza B by PCR NEGATIVE NEGATIVE Final    Comment: (NOTE) The Xpert Xpress SARS-CoV-2/FLU/RSV plus assay is intended as an aid in the  diagnosis of influenza from Nasopharyngeal swab specimens and should not be used as a sole basis for treatment. Nasal washings and aspirates are unacceptable for Xpert Xpress SARS-CoV-2/FLU/RSV testing.  Fact Sheet for Patients: EntrepreneurPulse.com.au  Fact Sheet for Healthcare Providers: IncredibleEmployment.be  This test is not yet approved or cleared by the [place] FDA and has been authorized for detection and/or diagnosis of SARS-CoV-2 by FDA under an Emergency Use Authorization (EUA). This EUA will remain in effect (meaning this test can be used) for the duration of the COVID-19 declaration under Section 564(b)(1) of the Act, 21 U.S.C. section 360bbb-3(b)(1), unless the authorization is terminated or revoked.     Resp Syncytial Virus by  PCR NEGATIVE NEGATIVE Final    Comment: (NOTE) Fact Sheet for Patients: EntrepreneurPulse.com.au  Fact Sheet for Healthcare Providers: IncredibleEmployment.be  This test is not yet approved or cleared by the [place] FDA and has been authorized for detection and/or diagnosis of SARS-CoV-2 by FDA under an Emergency Use Authorization (EUA). This EUA will remain in effect (meaning this test can be used) for the duration of the COVID-19 declaration under Section 564(b)(1) of the Act, 21 U.S.C. section 360bbb-3(b)(1), unless the authorization is terminated or revoked.  Performed at [facility], [address] [place], [place] [address]      Labs: BNP (last 3 results) Recent Labs    10/06/22 1329 10/29/22 1455 02/01/23 1856  BNP 294.1* 518.2* [id number]*   Basic Metabolic Panel: Recent Labs  Lab 02/01/23 1856 02/02/23 0257  NA 141 142  K 3.0* 3.4*  CL 107 108  CO2 23 25  GLUCOSE 103* 126*  BUN 23 23  CREATININE 1.13* 1.10*  CALCIUM 8.7* 8.5*  MG 2.0  --    Liver Function Tests: Recent Labs  Lab 02/01/23 1856  AST 37  ALT 26   ALKPHOS 660*  BILITOT 1.1  PROT 7.2  ALBUMIN 3.3*   No results for input(s): "LIPASE", "AMYLASE" in the last 168 hours. No results for input(s): "AMMONIA" in the last 168 hours. CBC: Recent Labs  Lab 02/01/23 1856 02/02/23 0257  WBC 6.5 6.7  NEUTROABS 4.6  --   HGB 11.5* 10.5*  HCT 36.0 33.1*  MCV 95.2 95.7  PLT 219 214   Cardiac Enzymes: Recent Labs  Lab 02/01/23 1856  CKTOTAL 57   BNP: Invalid input(s): "POCBNP" CBG: Recent Labs  Lab 02/02/23 0446 02/03/23 0433  GLUCAP 133* 96   D-Dimer No results for input(s): "DDIMER" in the last 72 hours. Hgb A1c No results for input(s): "HGBA1C" in the last 72 hours. Lipid Profile No results for input(s): "CHOL", "HDL", "LDLCALC", "TRIG", "CHOLHDL", "LDLDIRECT" in the last 72 hours. Thyroid function studies No results for input(s): "TSH", "T4TOTAL", "T3FREE", "THYROIDAB" in the last 72 hours.  Invalid input(s): "FREET3" Anemia work up Recent Labs    02/01/23 2316  VITAMINB12 1,369*   Urinalysis    Component Value Date/Time   COLORURINE YELLOW (A) 11/09/2022 1744   APPEARANCEUR HAZY (A) 11/09/2022 1744   LABSPEC 1.025 11/09/2022 1744   PHURINE 5.0 11/09/2022 1744   GLUCOSEU NEGATIVE 11/09/2022 [place] 11/09/2022 [place] 11/09/2022 1744   BILIRUBINUR neg 11/10/2015 1352   KETONESUR 5 (A) 11/09/2022 1744   PROTEINUR NEGATIVE 11/09/2022 1744   UROBILINOGEN negative 11/10/2015 1352   UROBILINOGEN 0.2 11/20/2013 0201   NITRITE NEGATIVE 11/09/2022 1744   LEUKOCYTESUR SMALL (A) 11/09/2022 1744   Sepsis Labs Recent Labs  Lab 02/01/23 1856 02/02/23 0257  WBC 6.5 6.7   Microbiology Recent Results (from the past 240 hour(s))  Resp panel by RT-PCR (RSV, Flu A&B, Covid) Anterior Nasal Swab     Status: None   Collection Time: 02/01/23  7:27 PM   Specimen: Anterior Nasal Swab  Result Value Ref Range Status   SARS Coronavirus 2 by RT PCR NEGATIVE NEGATIVE Final    Comment:  (NOTE) SARS-CoV-2 target nucleic acids are NOT DETECTED.  The SARS-CoV-2 RNA is generally detectable in upper respiratory specimens during the acute phase of infection. The lowest concentration of SARS-CoV-2 viral copies this assay can detect is 138 copies/mL. A negative result does not preclude SARS-Cov-2 infection and should  not be used as the sole basis for treatment or other patient management decisions. A negative result may occur with  improper specimen collection/handling, submission of specimen other than nasopharyngeal swab, presence of viral mutation(s) within the areas targeted by this assay, and inadequate number of viral copies(<138 copies/mL). A negative result must be combined with clinical observations, patient history, and epidemiological information. The expected result is Negative.  Fact Sheet for Patients:  EntrepreneurPulse.com.au  Fact Sheet for Healthcare Providers:  IncredibleEmployment.be  This test is no t yet approved or cleared by the [place] FDA and  has been authorized for detection and/or diagnosis of SARS-CoV-2 by FDA under an Emergency Use Authorization (EUA). This EUA will remain  in effect (meaning this test can be used) for the duration of the COVID-19 declaration under Section 564(b)(1) of the Act, 21 U.S.C.section 360bbb-3(b)(1), unless the authorization is terminated  or revoked sooner.       Influenza A by PCR NEGATIVE NEGATIVE Final   Influenza B by PCR NEGATIVE NEGATIVE Final    Comment: (NOTE) The Xpert Xpress SARS-CoV-2/FLU/RSV plus assay is intended as an aid in the diagnosis of influenza from Nasopharyngeal swab specimens and should not be used as a sole basis for treatment. Nasal washings and aspirates are unacceptable for Xpert Xpress SARS-CoV-2/FLU/RSV testing.  Fact Sheet for Patients: EntrepreneurPulse.com.au  Fact Sheet for Healthcare  Providers: IncredibleEmployment.be  This test is not yet approved or cleared by the [place] FDA and has been authorized for detection and/or diagnosis of SARS-CoV-2 by FDA under an Emergency Use Authorization (EUA). This EUA will remain in effect (meaning this test can be used) for the duration of the COVID-19 declaration under Section 564(b)(1) of the Act, 21 U.S.C. section 360bbb-3(b)(1), unless the authorization is terminated or revoked.     Resp Syncytial Virus by PCR NEGATIVE NEGATIVE Final    Comment: (NOTE) Fact Sheet for Patients: EntrepreneurPulse.com.au  Fact Sheet for Healthcare Providers: IncredibleEmployment.be  This test is not yet approved or cleared by the [place] FDA and has been authorized for detection and/or diagnosis of SARS-CoV-2 by FDA under an Emergency Use Authorization (EUA). This EUA will remain in effect (meaning this test can be used) for the duration of the COVID-19 declaration under Section 564(b)(1) of the Act, 21 U.S.C. section 360bbb-3(b)(1), unless the authorization is terminated or revoked.  Performed at [facility], [address], [place], [place] [address]      Time coordinating discharge: Over 30 minutes  SIGNED:   Sidney Ace, MD  [facility] 02/03/2023, 1:25 PM Pager   If 7PM-7AM, please contact night-coverage

## 2023-02-03 NOTE — TOC CM/SW Note (Signed)
Patient has orders to discharge home today. [place] liaison is aware. No further concerns. CSW signing off.  Dayton Scrape, Elsa

## 2023-02-07 ENCOUNTER — Encounter: Payer: Self-pay | Admitting: Internal Medicine

## 2023-02-07 ENCOUNTER — Other Ambulatory Visit: Payer: Self-pay | Admitting: Internal Medicine

## 2023-02-07 ENCOUNTER — Ambulatory Visit (INDEPENDENT_AMBULATORY_CARE_PROVIDER_SITE_OTHER): Payer: PPO | Admitting: Internal Medicine

## 2023-02-07 VITALS — BP 110/65 | HR 59 | Ht 66.5 in | Wt 115.4 lb

## 2023-02-07 DIAGNOSIS — G43009 Migraine without aura, not intractable, without status migrainosus: Secondary | ICD-10-CM

## 2023-02-07 DIAGNOSIS — I10 Essential (primary) hypertension: Secondary | ICD-10-CM | POA: Diagnosis not present

## 2023-02-07 DIAGNOSIS — F411 Generalized anxiety disorder: Secondary | ICD-10-CM

## 2023-02-07 DIAGNOSIS — R296 Repeated falls: Secondary | ICD-10-CM

## 2023-02-07 DIAGNOSIS — I482 Chronic atrial fibrillation, unspecified: Secondary | ICD-10-CM

## 2023-02-07 DIAGNOSIS — I5032 Chronic diastolic (congestive) heart failure: Secondary | ICD-10-CM | POA: Diagnosis not present

## 2023-02-07 DIAGNOSIS — G4700 Insomnia, unspecified: Secondary | ICD-10-CM

## 2023-02-07 MED ORDER — BUSPIRONE HCL 5 MG PO TABS: 5.0000 mg | ORAL_TABLET | Freq: Two times a day (BID) | ORAL | 2 refills | Status: DC

## 2023-02-07 MED ORDER — UBRELVY 50 MG PO TABS: 50.0000 mg | ORAL_TABLET | ORAL | 0 refills | Status: AC | PRN

## 2023-02-07 NOTE — Patient Instructions (Signed)
Taper Alprazolam over 2 wks

## 2023-02-07 NOTE — Progress Notes (Signed)
Established Patient Office Visit  Subjective:  Patient ID: Donna Evans, female    DOB: 06/29/1946  Age: 77 y.o. MRN: [id number]  Chief Complaint  Patient presents with   Follow-up    2 mo fu    Hospital f/u after another fall, sustained a contusion to her occipital scalp. Attributed to benzo use with concomitant use of etoh at night. Still c/o dizziness.     Past Medical History:  Diagnosis Date   Allergic state    Anginal pain ([place])    Prinzmetal'Khilynn Borntreger angina   Anxiety    Arthritis    osteoarthritis   Breast cancer ([place]) 1990   right breast cancer   Chronic pain    Chronic pain    COPD (chronic obstructive pulmonary disease) (HCC)    Depression    Edema    Fibromyalgia    Foot fracture    Bilateral   GERD (gastroesophageal reflux disease)    History of kidney stones    Hypertension    Leg fracture, right    Low back pain    Lumbosacral neuritis    Medulloadrenal hyperfunction (HCC)    Migraine headache    Peripheral neuropathy    Shoulder fracture, right    Stroke [facility])    TIA   Systemic lupus erythematosus ([place])    Thyroid disease    TIA (transient ischemic attack) 11/28/2013   Transient global amnesia 2011    Social History   Socioeconomic History   Marital status: Widowed    Spouse name: Not on file   Number of children: 1   Years of education: college3   Highest education level: Not on file  Occupational History   Occupation: Retired  Tobacco Use   Smoking status: Former    Packs/day: 1.00    Years: 40.00    Total pack years: 40.00    Types: Cigarettes    Quit date: 08/28/2005    Years since quitting: 17.4   Smokeless tobacco: Never  Vaping Use   Vaping Use: Never used  Substance and Sexual Activity   Alcohol use: Not Currently   Drug use: No   Sexual activity: Never  Other Topics Concern   Not on file  Social History Narrative   Not on file   Social Determinants of Health   Financial Resource Strain: Not on file  Food  Insecurity: No Food Insecurity (02/02/2023)   Hunger Vital Sign    Worried About Running Out of Food in the Last Year: Never true    [place] in the Last Year: Never true  Transportation Needs: No Transportation Needs (02/02/2023)   PRAPARE - Hydrologist (Medical): No    Lack of Transportation (Non-Medical): No  Physical Activity: Not on file  Stress: Not on file  Social Connections: Not on file  Intimate Partner Violence: Not At Risk (02/02/2023)   Humiliation, Afraid, Rape, and Kick questionnaire    Fear of Current or Ex-Partner: No    Emotionally Abused: No    Physically Abused: No    Sexually Abused: No    Family History  Problem Relation Age of Onset   Atrial fibrillation Mother    Atrial fibrillation Sister    Cancer Sister    Diabetes Sister    Breast cancer Sister 14   Diabetes Father    Cancer Sister    Diabetes Sister    Atrial fibrillation Sister    Kidney disease Maternal Aunt  Allergies  Allergen Reactions   Ciprofloxacin Nausea And Vomiting and Hives   Codeine Anaphylaxis, Hives and Other (See Comments)    Reaction:  Unknown   Fluconazole Hives   Latex Anaphylaxis and Rash   Meperidine Anaphylaxis and Hives    Reaction:  Unknown  Reaction:  Unknown   Unknown   Morphine And Related Hives, Anaphylaxis and Other (See Comments)    Reaction:  Unknown   unknown  Other reaction(Ricarda Atayde): Other (See Comments)  unknown   Doxycycline Nausea And Vomiting   Flagyl [Metronidazole] Nausea And Vomiting    Reaction:  Unknown    Influenza Vaccines Swelling    Localized swelling   Tape Other (See Comments)    Pt allergic to Adhesive tape and latex.(  Skin breaks out)   Valtrex [Valacyclovir Hcl] Hives   Buprenorphine     Other reaction(Zhaniya Swallows): Other (see comments) unknown   Duloxetine Other (See Comments)    Other Reaction(Giovan Pinsky): GI intolerance  Reaction:  Unknown     Reaction:  Unknown     Reaction:  Unknown   Silicone Other  (See Comments)    Pt allergic to Adhesive tape and latex.(  Skin breaks out)   Tramadol     Other reaction(Eulogia Dismore): Not available   Amoxicillin-Pot Clavulanate Nausea And Vomiting   Sulfamethoxazole-Trimethoprim Nausea And Vomiting and Rash   Valacyclovir Rash    Review of Systems  Constitutional: Negative.   HENT: Negative.    Eyes: Negative.   Respiratory: Negative.    Cardiovascular: Negative.   Gastrointestinal: Negative.   Genitourinary: Negative.   Skin: Negative.   Neurological: Negative.   Endo/Heme/Allergies: Negative.        Objective:   BP 110/65   Pulse (!) 59   Ht 5' 6.5" (1.689 m)   Wt 115 lb 6.4 oz (52.3 kg)   SpO2 96%   BMI 18.35 kg/m   Vitals:   02/07/23 1155  BP: 110/65  Pulse: (!) 59  Height: 5' 6.5" (1.689 m)  Weight: 115 lb 6.4 oz (52.3 kg)  SpO2: 96%  BMI (Calculated): 18.35    Physical Exam Vitals reviewed.  Constitutional:      General: She is not in acute distress. HENT:     Head: Normocephalic.     Nose: Nose normal.     Mouth/Throat:     Mouth: Mucous membranes are moist.  Eyes:     Extraocular Movements: Extraocular movements intact.     Pupils: Pupils are equal, round, and reactive to light.  Cardiovascular:     Rate and Rhythm: Normal rate and regular rhythm.     Heart sounds: No murmur heard. Pulmonary:     Effort: Pulmonary effort is normal.     Breath sounds: No rhonchi or rales.  Abdominal:     General: Abdomen is flat.     Palpations: There is no hepatomegaly, splenomegaly or mass.  Musculoskeletal:        General: Normal range of motion.     Cervical back: Normal range of motion. No tenderness.  Skin:    General: Skin is warm and dry.     Comments: Hematoma scalp and ecchymosis on chest  Neurological:     General: No focal deficit present.     Mental Status: She is alert and oriented to person, place, and time.     Cranial Nerves: No cranial nerve deficit.     Motor: No weakness.  Psychiatric:        Mood and  Affect: Mood normal.        Behavior: Behavior normal.      No results found for any visits on 02/07/23.      Assessment & Plan:   Problem List Items Addressed This Visit   None   No follow-ups on file.   Total time spent: 30 minutes  Volanda Napoleon, MD  02/07/2023

## 2023-02-09 DIAGNOSIS — G4700 Insomnia, unspecified: Secondary | ICD-10-CM | POA: Diagnosis not present

## 2023-02-09 DIAGNOSIS — Z7901 Long term (current) use of anticoagulants: Secondary | ICD-10-CM | POA: Diagnosis not present

## 2023-02-09 DIAGNOSIS — F32A Depression, unspecified: Secondary | ICD-10-CM | POA: Diagnosis not present

## 2023-02-09 DIAGNOSIS — F411 Generalized anxiety disorder: Secondary | ICD-10-CM | POA: Diagnosis not present

## 2023-02-09 DIAGNOSIS — Z87440 Personal history of urinary (tract) infections: Secondary | ICD-10-CM | POA: Diagnosis not present

## 2023-02-09 DIAGNOSIS — K580 Irritable bowel syndrome with diarrhea: Secondary | ICD-10-CM | POA: Diagnosis not present

## 2023-02-09 DIAGNOSIS — G894 Chronic pain syndrome: Secondary | ICD-10-CM | POA: Diagnosis not present

## 2023-02-09 DIAGNOSIS — Z961 Presence of intraocular lens: Secondary | ICD-10-CM | POA: Diagnosis not present

## 2023-02-09 DIAGNOSIS — J449 Chronic obstructive pulmonary disease, unspecified: Secondary | ICD-10-CM | POA: Diagnosis not present

## 2023-02-09 DIAGNOSIS — N1832 Chronic kidney disease, stage 3b: Secondary | ICD-10-CM | POA: Diagnosis not present

## 2023-02-09 DIAGNOSIS — K219 Gastro-esophageal reflux disease without esophagitis: Secondary | ICD-10-CM | POA: Diagnosis not present

## 2023-02-09 DIAGNOSIS — Z9181 History of falling: Secondary | ICD-10-CM | POA: Diagnosis not present

## 2023-02-09 DIAGNOSIS — I130 Hypertensive heart and chronic kidney disease with heart failure and stage 1 through stage 4 chronic kidney disease, or unspecified chronic kidney disease: Secondary | ICD-10-CM | POA: Diagnosis not present

## 2023-02-09 DIAGNOSIS — E782 Mixed hyperlipidemia: Secondary | ICD-10-CM | POA: Diagnosis not present

## 2023-02-09 DIAGNOSIS — M4856XD Collapsed vertebra, not elsewhere classified, lumbar region, subsequent encounter for fracture with routine healing: Secondary | ICD-10-CM | POA: Diagnosis not present

## 2023-02-09 DIAGNOSIS — I480 Paroxysmal atrial fibrillation: Secondary | ICD-10-CM | POA: Diagnosis not present

## 2023-02-09 DIAGNOSIS — M797 Fibromyalgia: Secondary | ICD-10-CM | POA: Diagnosis not present

## 2023-02-09 DIAGNOSIS — M329 Systemic lupus erythematosus, unspecified: Secondary | ICD-10-CM | POA: Diagnosis not present

## 2023-02-09 DIAGNOSIS — I951 Orthostatic hypotension: Secondary | ICD-10-CM | POA: Diagnosis not present

## 2023-02-09 DIAGNOSIS — I5020 Unspecified systolic (congestive) heart failure: Secondary | ICD-10-CM | POA: Diagnosis not present

## 2023-02-09 NOTE — Progress Notes (Signed)
Because patient was in AFIB and too look for heart failure a BNP was ordered.

## 2023-02-10 ENCOUNTER — Telehealth: Payer: Self-pay

## 2023-02-10 NOTE — Progress Notes (Signed)
PT with frequent falls-- covid test ordered to evaluate for possible cause of weakness

## 2023-02-10 NOTE — Telephone Encounter (Signed)
Patients friend called asking for clarification on her xanax and her buspirone that she is now taking, should they titrate her down off the xanax or just stop the xanax and just only take the buspar

## 2023-02-16 DIAGNOSIS — I130 Hypertensive heart and chronic kidney disease with heart failure and stage 1 through stage 4 chronic kidney disease, or unspecified chronic kidney disease: Secondary | ICD-10-CM | POA: Diagnosis not present

## 2023-02-16 DIAGNOSIS — F32A Depression, unspecified: Secondary | ICD-10-CM | POA: Diagnosis not present

## 2023-02-16 DIAGNOSIS — Z961 Presence of intraocular lens: Secondary | ICD-10-CM | POA: Diagnosis not present

## 2023-02-16 DIAGNOSIS — Z7901 Long term (current) use of anticoagulants: Secondary | ICD-10-CM | POA: Diagnosis not present

## 2023-02-16 DIAGNOSIS — K580 Irritable bowel syndrome with diarrhea: Secondary | ICD-10-CM | POA: Diagnosis not present

## 2023-02-16 DIAGNOSIS — Z87440 Personal history of urinary (tract) infections: Secondary | ICD-10-CM | POA: Diagnosis not present

## 2023-02-16 DIAGNOSIS — F411 Generalized anxiety disorder: Secondary | ICD-10-CM | POA: Diagnosis not present

## 2023-02-16 DIAGNOSIS — N1832 Chronic kidney disease, stage 3b: Secondary | ICD-10-CM | POA: Diagnosis not present

## 2023-02-16 DIAGNOSIS — Z9181 History of falling: Secondary | ICD-10-CM | POA: Diagnosis not present

## 2023-02-16 DIAGNOSIS — M4856XD Collapsed vertebra, not elsewhere classified, lumbar region, subsequent encounter for fracture with routine healing: Secondary | ICD-10-CM | POA: Diagnosis not present

## 2023-02-16 DIAGNOSIS — G4700 Insomnia, unspecified: Secondary | ICD-10-CM | POA: Diagnosis not present

## 2023-02-16 DIAGNOSIS — E782 Mixed hyperlipidemia: Secondary | ICD-10-CM | POA: Diagnosis not present

## 2023-02-16 DIAGNOSIS — I951 Orthostatic hypotension: Secondary | ICD-10-CM | POA: Diagnosis not present

## 2023-02-16 DIAGNOSIS — I5020 Unspecified systolic (congestive) heart failure: Secondary | ICD-10-CM | POA: Diagnosis not present

## 2023-02-16 DIAGNOSIS — I480 Paroxysmal atrial fibrillation: Secondary | ICD-10-CM | POA: Diagnosis not present

## 2023-02-16 DIAGNOSIS — K219 Gastro-esophageal reflux disease without esophagitis: Secondary | ICD-10-CM | POA: Diagnosis not present

## 2023-02-16 DIAGNOSIS — M329 Systemic lupus erythematosus, unspecified: Secondary | ICD-10-CM | POA: Diagnosis not present

## 2023-02-16 DIAGNOSIS — M797 Fibromyalgia: Secondary | ICD-10-CM | POA: Diagnosis not present

## 2023-02-16 DIAGNOSIS — J449 Chronic obstructive pulmonary disease, unspecified: Secondary | ICD-10-CM | POA: Diagnosis not present

## 2023-02-16 DIAGNOSIS — G894 Chronic pain syndrome: Secondary | ICD-10-CM | POA: Diagnosis not present

## 2023-02-22 DIAGNOSIS — K580 Irritable bowel syndrome with diarrhea: Secondary | ICD-10-CM | POA: Diagnosis not present

## 2023-02-22 DIAGNOSIS — G4700 Insomnia, unspecified: Secondary | ICD-10-CM | POA: Diagnosis not present

## 2023-02-22 DIAGNOSIS — K219 Gastro-esophageal reflux disease without esophagitis: Secondary | ICD-10-CM | POA: Diagnosis not present

## 2023-02-22 DIAGNOSIS — F32A Depression, unspecified: Secondary | ICD-10-CM | POA: Diagnosis not present

## 2023-02-22 DIAGNOSIS — N1832 Chronic kidney disease, stage 3b: Secondary | ICD-10-CM | POA: Diagnosis not present

## 2023-02-22 DIAGNOSIS — M329 Systemic lupus erythematosus, unspecified: Secondary | ICD-10-CM | POA: Diagnosis not present

## 2023-02-22 DIAGNOSIS — E782 Mixed hyperlipidemia: Secondary | ICD-10-CM | POA: Diagnosis not present

## 2023-02-22 DIAGNOSIS — I951 Orthostatic hypotension: Secondary | ICD-10-CM | POA: Diagnosis not present

## 2023-02-22 DIAGNOSIS — I5020 Unspecified systolic (congestive) heart failure: Secondary | ICD-10-CM | POA: Diagnosis not present

## 2023-02-22 DIAGNOSIS — Z87440 Personal history of urinary (tract) infections: Secondary | ICD-10-CM | POA: Diagnosis not present

## 2023-02-22 DIAGNOSIS — Z9181 History of falling: Secondary | ICD-10-CM | POA: Diagnosis not present

## 2023-02-22 DIAGNOSIS — I130 Hypertensive heart and chronic kidney disease with heart failure and stage 1 through stage 4 chronic kidney disease, or unspecified chronic kidney disease: Secondary | ICD-10-CM | POA: Diagnosis not present

## 2023-02-22 DIAGNOSIS — J449 Chronic obstructive pulmonary disease, unspecified: Secondary | ICD-10-CM | POA: Diagnosis not present

## 2023-02-22 DIAGNOSIS — M4856XD Collapsed vertebra, not elsewhere classified, lumbar region, subsequent encounter for fracture with routine healing: Secondary | ICD-10-CM | POA: Diagnosis not present

## 2023-02-22 DIAGNOSIS — G894 Chronic pain syndrome: Secondary | ICD-10-CM | POA: Diagnosis not present

## 2023-02-22 DIAGNOSIS — M797 Fibromyalgia: Secondary | ICD-10-CM | POA: Diagnosis not present

## 2023-02-22 DIAGNOSIS — Z961 Presence of intraocular lens: Secondary | ICD-10-CM | POA: Diagnosis not present

## 2023-02-22 DIAGNOSIS — I480 Paroxysmal atrial fibrillation: Secondary | ICD-10-CM | POA: Diagnosis not present

## 2023-02-22 DIAGNOSIS — F411 Generalized anxiety disorder: Secondary | ICD-10-CM | POA: Diagnosis not present

## 2023-02-22 DIAGNOSIS — Z7901 Long term (current) use of anticoagulants: Secondary | ICD-10-CM | POA: Diagnosis not present

## 2023-02-23 DIAGNOSIS — E782 Mixed hyperlipidemia: Secondary | ICD-10-CM | POA: Diagnosis not present

## 2023-02-23 DIAGNOSIS — Z7901 Long term (current) use of anticoagulants: Secondary | ICD-10-CM | POA: Diagnosis not present

## 2023-02-23 DIAGNOSIS — F411 Generalized anxiety disorder: Secondary | ICD-10-CM | POA: Diagnosis not present

## 2023-02-23 DIAGNOSIS — G894 Chronic pain syndrome: Secondary | ICD-10-CM | POA: Diagnosis not present

## 2023-02-23 DIAGNOSIS — F32A Depression, unspecified: Secondary | ICD-10-CM | POA: Diagnosis not present

## 2023-02-23 DIAGNOSIS — I5020 Unspecified systolic (congestive) heart failure: Secondary | ICD-10-CM | POA: Diagnosis not present

## 2023-02-23 DIAGNOSIS — G4700 Insomnia, unspecified: Secondary | ICD-10-CM | POA: Diagnosis not present

## 2023-02-23 DIAGNOSIS — J449 Chronic obstructive pulmonary disease, unspecified: Secondary | ICD-10-CM | POA: Diagnosis not present

## 2023-02-23 DIAGNOSIS — I480 Paroxysmal atrial fibrillation: Secondary | ICD-10-CM | POA: Diagnosis not present

## 2023-02-23 DIAGNOSIS — N1832 Chronic kidney disease, stage 3b: Secondary | ICD-10-CM | POA: Diagnosis not present

## 2023-02-23 DIAGNOSIS — I130 Hypertensive heart and chronic kidney disease with heart failure and stage 1 through stage 4 chronic kidney disease, or unspecified chronic kidney disease: Secondary | ICD-10-CM | POA: Diagnosis not present

## 2023-02-23 DIAGNOSIS — M4856XD Collapsed vertebra, not elsewhere classified, lumbar region, subsequent encounter for fracture with routine healing: Secondary | ICD-10-CM | POA: Diagnosis not present

## 2023-02-23 DIAGNOSIS — I951 Orthostatic hypotension: Secondary | ICD-10-CM | POA: Diagnosis not present

## 2023-02-23 DIAGNOSIS — M329 Systemic lupus erythematosus, unspecified: Secondary | ICD-10-CM | POA: Diagnosis not present

## 2023-02-23 DIAGNOSIS — Z87440 Personal history of urinary (tract) infections: Secondary | ICD-10-CM | POA: Diagnosis not present

## 2023-02-23 DIAGNOSIS — Z961 Presence of intraocular lens: Secondary | ICD-10-CM | POA: Diagnosis not present

## 2023-02-23 DIAGNOSIS — K219 Gastro-esophageal reflux disease without esophagitis: Secondary | ICD-10-CM | POA: Diagnosis not present

## 2023-02-23 DIAGNOSIS — Z9181 History of falling: Secondary | ICD-10-CM | POA: Diagnosis not present

## 2023-02-23 DIAGNOSIS — M797 Fibromyalgia: Secondary | ICD-10-CM | POA: Diagnosis not present

## 2023-02-23 DIAGNOSIS — K580 Irritable bowel syndrome with diarrhea: Secondary | ICD-10-CM | POA: Diagnosis not present

## 2023-02-24 DIAGNOSIS — Z7901 Long term (current) use of anticoagulants: Secondary | ICD-10-CM | POA: Diagnosis not present

## 2023-02-24 DIAGNOSIS — Z5181 Encounter for therapeutic drug level monitoring: Secondary | ICD-10-CM | POA: Diagnosis not present

## 2023-02-25 ENCOUNTER — Telehealth (INDEPENDENT_AMBULATORY_CARE_PROVIDER_SITE_OTHER): Payer: Self-pay | Admitting: Nurse Practitioner

## 2023-02-25 NOTE — Telephone Encounter (Signed)
Patient wants to know if her results was sent out to her doctor at [facility] clinic.  She states she have an appointment coming soon and her would need them.  She also states she has been in and out of the hospital since December and wanted to know if she had any blockage when ultra sound was done.  Please advise.

## 2023-02-25 NOTE — Telephone Encounter (Signed)
Called and spoke with the patient

## 2023-02-25 NOTE — Telephone Encounter (Signed)
Which ultrasounds is she referring to? We last saw her on 07/07/2022.  We can fax those over to her doctor if she knows which doctor she would like them to go to we just need the name.  The doctors at [facility] clinic have access the medical records and ultrasounds but we can fax them to make sure that they have them.  When she had the ultrasound on 07/07/2022 she did not have any blockages in her arteries or her veins.

## 2023-03-01 DIAGNOSIS — Z7189 Other specified counseling: Secondary | ICD-10-CM

## 2023-03-01 DIAGNOSIS — I10 Essential (primary) hypertension: Secondary | ICD-10-CM | POA: Diagnosis not present

## 2023-03-03 DIAGNOSIS — I480 Paroxysmal atrial fibrillation: Secondary | ICD-10-CM | POA: Diagnosis not present

## 2023-03-04 DIAGNOSIS — G4700 Insomnia, unspecified: Secondary | ICD-10-CM | POA: Diagnosis not present

## 2023-03-04 DIAGNOSIS — M329 Systemic lupus erythematosus, unspecified: Secondary | ICD-10-CM | POA: Diagnosis not present

## 2023-03-04 DIAGNOSIS — N1832 Chronic kidney disease, stage 3b: Secondary | ICD-10-CM | POA: Diagnosis not present

## 2023-03-04 DIAGNOSIS — I480 Paroxysmal atrial fibrillation: Secondary | ICD-10-CM | POA: Diagnosis not present

## 2023-03-04 DIAGNOSIS — I951 Orthostatic hypotension: Secondary | ICD-10-CM | POA: Diagnosis not present

## 2023-03-04 DIAGNOSIS — Z961 Presence of intraocular lens: Secondary | ICD-10-CM | POA: Diagnosis not present

## 2023-03-04 DIAGNOSIS — E782 Mixed hyperlipidemia: Secondary | ICD-10-CM | POA: Diagnosis not present

## 2023-03-04 DIAGNOSIS — J449 Chronic obstructive pulmonary disease, unspecified: Secondary | ICD-10-CM | POA: Diagnosis not present

## 2023-03-04 DIAGNOSIS — I5020 Unspecified systolic (congestive) heart failure: Secondary | ICD-10-CM | POA: Diagnosis not present

## 2023-03-04 DIAGNOSIS — Z9181 History of falling: Secondary | ICD-10-CM | POA: Diagnosis not present

## 2023-03-04 DIAGNOSIS — I130 Hypertensive heart and chronic kidney disease with heart failure and stage 1 through stage 4 chronic kidney disease, or unspecified chronic kidney disease: Secondary | ICD-10-CM | POA: Diagnosis not present

## 2023-03-04 DIAGNOSIS — G894 Chronic pain syndrome: Secondary | ICD-10-CM | POA: Diagnosis not present

## 2023-03-04 DIAGNOSIS — F32A Depression, unspecified: Secondary | ICD-10-CM | POA: Diagnosis not present

## 2023-03-04 DIAGNOSIS — Z7901 Long term (current) use of anticoagulants: Secondary | ICD-10-CM | POA: Diagnosis not present

## 2023-03-04 DIAGNOSIS — M797 Fibromyalgia: Secondary | ICD-10-CM | POA: Diagnosis not present

## 2023-03-04 DIAGNOSIS — M4856XD Collapsed vertebra, not elsewhere classified, lumbar region, subsequent encounter for fracture with routine healing: Secondary | ICD-10-CM | POA: Diagnosis not present

## 2023-03-04 DIAGNOSIS — K219 Gastro-esophageal reflux disease without esophagitis: Secondary | ICD-10-CM | POA: Diagnosis not present

## 2023-03-04 DIAGNOSIS — K580 Irritable bowel syndrome with diarrhea: Secondary | ICD-10-CM | POA: Diagnosis not present

## 2023-03-04 DIAGNOSIS — F411 Generalized anxiety disorder: Secondary | ICD-10-CM | POA: Diagnosis not present

## 2023-03-04 DIAGNOSIS — Z87440 Personal history of urinary (tract) infections: Secondary | ICD-10-CM | POA: Diagnosis not present

## 2023-03-08 ENCOUNTER — Ambulatory Visit (INDEPENDENT_AMBULATORY_CARE_PROVIDER_SITE_OTHER): Payer: PPO | Admitting: Internal Medicine

## 2023-03-08 ENCOUNTER — Encounter: Payer: Self-pay | Admitting: Internal Medicine

## 2023-03-08 VITALS — BP 110/70 | HR 45 | Temp 95.9°F | Ht 67.0 in | Wt 111.2 lb

## 2023-03-08 DIAGNOSIS — G4709 Other insomnia: Secondary | ICD-10-CM | POA: Diagnosis not present

## 2023-03-08 DIAGNOSIS — I10 Essential (primary) hypertension: Secondary | ICD-10-CM

## 2023-03-08 DIAGNOSIS — I482 Chronic atrial fibrillation, unspecified: Secondary | ICD-10-CM

## 2023-03-08 MED ORDER — BELSOMRA 10 MG PO TABS: 10.0000 mg | ORAL_TABLET | Freq: Every day | ORAL | 2 refills | Status: DC

## 2023-03-08 NOTE — Progress Notes (Signed)
Established Patient Office Visit  Subjective:  Patient ID: Donna Evans, female    DOB: 1946/02/07  Age: 77 y.o. MRN: [id number]  Chief Complaint  Patient presents with   Follow-up    1 month follow up    Reports improving ambulation and less frequent falls. Scalp hematoma still hasn't resolved and bleeds occasionally. Still c/o insomnia.    No other concerns at this time.   Past Medical History:  Diagnosis Date   Allergic state    Anginal pain ([place])    Prinzmetal'Vina Byrd angina   Anxiety    Arthritis    osteoarthritis   Breast cancer ([place]) 1990   right breast cancer   Chronic pain    Chronic pain    COPD (chronic obstructive pulmonary disease) (HCC)    Depression    Edema    Fibromyalgia    Foot fracture    Bilateral   GERD (gastroesophageal reflux disease)    History of kidney stones    Hypertension    Leg fracture, right    Low back pain    Lumbosacral neuritis    Medulloadrenal hyperfunction (HCC)    Migraine headache    Peripheral neuropathy    Shoulder fracture, right    Stroke [facility])    TIA   Systemic lupus erythematosus ([place])    Thyroid disease    TIA (transient ischemic attack) 11/28/2013   Transient global amnesia 2011    Past Surgical History:  Procedure Laterality Date   ABDOMINAL HYSTERECTOMY  1981   APPENDECTOMY  1957   AUGMENTATION MAMMAPLASTY Bilateral 1990   saline submuscular   BACK SURGERY  1989   BILATERAL TOTAL MASTECTOMY WITH AXILLARY LYMPH NODE DISSECTION  1990   BREAST IMPLANT EXCHANGE Bilateral 05/11/2016   Procedure: REMOVAL AND REPLACEMENT OF BREAST IMPLANTS ;  Surgeon: Irene Limbo, MD;  Location: [place];  Service: Plastics;  Laterality: Bilateral;   CAPSULECTOMY Bilateral 05/11/2016   Procedure: CAPSULECTOMY CAPSULORRAPHY ;  Surgeon: Irene Limbo, MD;  Location: [place];  Service: Plastics;  Laterality: Bilateral;   CATARACT EXTRACTION W/PHACO Right 12/26/2018   Procedure: CATARACT  EXTRACTION PHACO AND INTRAOCULAR LENS PLACEMENT ([place]) RIGHT;  Surgeon: Birder Robson, MD;  Location: [facility];  Service: Ophthalmology;  Laterality: Right;  [place]  00:46 CDE 8.50 Fluid pack lot # [id number]   CATARACT EXTRACTION W/PHACO Left 01/23/2019   Procedure: CATARACT EXTRACTION PHACO AND INTRAOCULAR LENS PLACEMENT ([place]) LEFT;  Surgeon: Birder Robson, MD;  Location: [facility];  Service: Ophthalmology;  Laterality: Left;  [place]  00:45 CDE  8.41 fluid pack lot # [id number]   CHOLECYSTECTOMY  1979   COLONOSCOPY WITH PROPOFOL N/A 01/25/2018   Procedure: COLONOSCOPY WITH PROPOFOL;  Surgeon: Manya Silvas, MD;  Location: [facility] ENDOSCOPY;  Service: Endoscopy;  Laterality: N/A;   COPD     ESOPHAGOGASTRODUODENOSCOPY (EGD) WITH PROPOFOL N/A 03/07/2018   Procedure: ESOPHAGOGASTRODUODENOSCOPY (EGD) WITH PROPOFOL;  Surgeon: Manya Silvas, MD;  Location: [facility] ENDOSCOPY;  Service: Endoscopy;  Laterality: N/A;   FRACTURE SURGERY     HARDWARE REMOVAL Left 06/18/2020   Procedure: HARDWARE REMOVAL;  Surgeon: Lovell Sheehan, MD;  Location: [facility];  Service: Orthopedics;  Laterality: Left;   HUMERUS IM NAIL Left 12/31/2019   Procedure: INTRAMEDULLARY (IM) NAIL HUMERAL;  Surgeon: Lovell Sheehan, MD;  Location: [facility];  Service: Orthopedics;  Laterality: Left;   LIPOSUCTION Right 05/11/2016   Procedure: LIPOSUCTION;  Surgeon: Irene Limbo, MD;  Location: [facility];  Service: Plastics;  Laterality: Right;   LIVER BIOPSY  2011   MASTECTOMY SUBCUTANEOUS Bilateral 1990   MASTOPEXY Bilateral 05/11/2016   Procedure: MASTOPEXY BILATERAL ;  Surgeon: Irene Limbo, MD;  Location: [place];  Service: Plastics;  Laterality: Bilateral;    Social History   Socioeconomic History   Marital status: Widowed    Spouse name: Not on file   Number of children: 1   Years of education: college3   Highest education level: Not on file  Occupational History   Occupation: Retired   Tobacco Use   Smoking status: Former    Packs/day: 1.00    Years: 40.00    Additional pack years: 0.00    Total pack years: 40.00    Types: Cigarettes    Quit date: 08/28/2005    Years since quitting: 17.5   Smokeless tobacco: Never  Vaping Use   Vaping Use: Never used  Substance and Sexual Activity   Alcohol use: Not Currently   Drug use: No   Sexual activity: Never  Other Topics Concern   Not on file  Social History Narrative   Not on file   Social Determinants of Health   Financial Resource Strain: Not on file  Food Insecurity: No Food Insecurity (02/02/2023)   Hunger Vital Sign    Worried About Running Out of Food in the Last Year: Never true    [place] in the Last Year: Never true  Transportation Needs: No Transportation Needs (02/02/2023)   PRAPARE - Hydrologist (Medical): No    Lack of Transportation (Non-Medical): No  Physical Activity: Not on file  Stress: Not on file  Social Connections: Not on file  Intimate Partner Violence: Not At Risk (02/02/2023)   Humiliation, Afraid, Rape, and Kick questionnaire    Fear of Current or Ex-Partner: No    Emotionally Abused: No    Physically Abused: No    Sexually Abused: No    Family History  Problem Relation Age of Onset   Atrial fibrillation Mother    Atrial fibrillation Sister    Cancer Sister    Diabetes Sister    Breast cancer Sister 39   Diabetes Father    Cancer Sister    Diabetes Sister    Atrial fibrillation Sister    Kidney disease Maternal Aunt     Allergies  Allergen Reactions   Ciprofloxacin Nausea And Vomiting and Hives   Codeine Anaphylaxis, Hives and Other (See Comments)    Reaction:  Unknown   Fluconazole Hives   Latex Anaphylaxis and Rash   Meperidine Anaphylaxis and Hives    Reaction:  Unknown  Reaction:  Unknown   Unknown   Morphine And Related Hives, Anaphylaxis and Other (See Comments)    Reaction:  Unknown   unknown  Other reaction(Jazmynn Pho): Other  (See Comments)  unknown   Doxycycline Nausea And Vomiting   Flagyl [Metronidazole] Nausea And Vomiting    Reaction:  Unknown    Influenza Vaccines Swelling    Localized swelling   Tape Other (See Comments)    Pt allergic to Adhesive tape and latex.(  Skin breaks out)   Valtrex [Valacyclovir Hcl] Hives   Buprenorphine     Other reaction(Terryl Niziolek): Other (see comments) unknown   Duloxetine Other (See Comments)    Other Reaction(Cicley Ganesh): GI intolerance  Reaction:  Unknown     Reaction:  Unknown     Reaction:  Unknown  Silicone Other (See Comments)    Pt allergic to Adhesive tape and latex.(  Skin breaks out)   Tramadol     Other reaction(Isiac Breighner): Not available   Amoxicillin-Pot Clavulanate Nausea And Vomiting   Sulfamethoxazole-Trimethoprim Nausea And Vomiting and Rash   Valacyclovir Rash    Review of Systems  Constitutional: Negative.   HENT: Negative.    Eyes: Negative.   Respiratory: Negative.    Cardiovascular: Negative.   Gastrointestinal: Negative.   Genitourinary: Negative.   Neurological: Negative.   Endo/Heme/Allergies: Negative.        Objective:   BP 110/70   Pulse (!) 45   Temp (!) 95.9 F (35.5 C) (Tympanic)   Ht 5\' 7"  (1.702 m)   Wt 111 lb 3.2 oz (50.4 kg)   SpO2 93%   BMI 17.42 kg/m   Vitals:   03/08/23 1136  BP: 110/70  Pulse: (!) 45  Temp: (!) 95.9 F (35.5 C)  Height: 5\' 7"  (1.702 m)  Weight: 111 lb 3.2 oz (50.4 kg)  SpO2: 93%  TempSrc: Tympanic  BMI (Calculated): 17.41    Physical Exam Vitals reviewed.  Constitutional:      General: She is not in acute distress. HENT:     Head: Normocephalic.     Comments: Hematoma occipital area    Nose: Nose normal.     Mouth/Throat:     Mouth: Mucous membranes are moist.  Eyes:     Extraocular Movements: Extraocular movements intact.     Pupils: Pupils are equal, round, and reactive to light.  Cardiovascular:     Rate and Rhythm: Normal rate and regular rhythm.     Heart sounds: No murmur  heard. Pulmonary:     Effort: Pulmonary effort is normal.     Breath sounds: No rhonchi or rales.  Abdominal:     General: Abdomen is flat.     Palpations: There is no hepatomegaly, splenomegaly or mass.  Musculoskeletal:        General: Normal range of motion.     Cervical back: Normal range of motion. No tenderness.  Skin:    General: Skin is warm and dry.  Neurological:     General: No focal deficit present.     Mental Status: She is alert and oriented to person, place, and time.     Cranial Nerves: No cranial nerve deficit.     Motor: No weakness.  Psychiatric:        Mood and Affect: Mood normal.        Behavior: Behavior normal.      No results found for any visits on 03/08/23.      Assessment & Plan:   Problem List Items Addressed This Visit   None   No follow-ups on file.   Total time spent: 30 minutes  Volanda Napoleon, MD  03/08/2023

## 2023-03-09 ENCOUNTER — Telehealth: Payer: Self-pay

## 2023-03-09 ENCOUNTER — Other Ambulatory Visit: Payer: Self-pay

## 2023-03-09 DIAGNOSIS — E782 Mixed hyperlipidemia: Secondary | ICD-10-CM | POA: Diagnosis not present

## 2023-03-09 DIAGNOSIS — M329 Systemic lupus erythematosus, unspecified: Secondary | ICD-10-CM | POA: Diagnosis not present

## 2023-03-09 DIAGNOSIS — F411 Generalized anxiety disorder: Secondary | ICD-10-CM | POA: Diagnosis not present

## 2023-03-09 DIAGNOSIS — F32A Depression, unspecified: Secondary | ICD-10-CM | POA: Diagnosis not present

## 2023-03-09 DIAGNOSIS — G4700 Insomnia, unspecified: Secondary | ICD-10-CM | POA: Diagnosis not present

## 2023-03-09 DIAGNOSIS — M797 Fibromyalgia: Secondary | ICD-10-CM | POA: Diagnosis not present

## 2023-03-09 DIAGNOSIS — J449 Chronic obstructive pulmonary disease, unspecified: Secondary | ICD-10-CM | POA: Diagnosis not present

## 2023-03-09 DIAGNOSIS — I951 Orthostatic hypotension: Secondary | ICD-10-CM | POA: Diagnosis not present

## 2023-03-09 DIAGNOSIS — Z9181 History of falling: Secondary | ICD-10-CM | POA: Diagnosis not present

## 2023-03-09 DIAGNOSIS — I5020 Unspecified systolic (congestive) heart failure: Secondary | ICD-10-CM | POA: Diagnosis not present

## 2023-03-09 DIAGNOSIS — N1832 Chronic kidney disease, stage 3b: Secondary | ICD-10-CM | POA: Diagnosis not present

## 2023-03-09 DIAGNOSIS — I480 Paroxysmal atrial fibrillation: Secondary | ICD-10-CM | POA: Diagnosis not present

## 2023-03-09 DIAGNOSIS — G894 Chronic pain syndrome: Secondary | ICD-10-CM | POA: Diagnosis not present

## 2023-03-09 DIAGNOSIS — K219 Gastro-esophageal reflux disease without esophagitis: Secondary | ICD-10-CM | POA: Diagnosis not present

## 2023-03-09 DIAGNOSIS — Z7901 Long term (current) use of anticoagulants: Secondary | ICD-10-CM | POA: Diagnosis not present

## 2023-03-09 DIAGNOSIS — M4856XD Collapsed vertebra, not elsewhere classified, lumbar region, subsequent encounter for fracture with routine healing: Secondary | ICD-10-CM | POA: Diagnosis not present

## 2023-03-09 DIAGNOSIS — K580 Irritable bowel syndrome with diarrhea: Secondary | ICD-10-CM | POA: Diagnosis not present

## 2023-03-09 DIAGNOSIS — Z961 Presence of intraocular lens: Secondary | ICD-10-CM | POA: Diagnosis not present

## 2023-03-09 DIAGNOSIS — I130 Hypertensive heart and chronic kidney disease with heart failure and stage 1 through stage 4 chronic kidney disease, or unspecified chronic kidney disease: Secondary | ICD-10-CM | POA: Diagnosis not present

## 2023-03-09 DIAGNOSIS — Z87440 Personal history of urinary (tract) infections: Secondary | ICD-10-CM | POA: Diagnosis not present

## 2023-03-09 NOTE — Telephone Encounter (Signed)
See other message

## 2023-03-09 NOTE — Telephone Encounter (Signed)
[phone] 

## 2023-03-10 DIAGNOSIS — I480 Paroxysmal atrial fibrillation: Secondary | ICD-10-CM | POA: Diagnosis not present

## 2023-03-14 DIAGNOSIS — K580 Irritable bowel syndrome with diarrhea: Secondary | ICD-10-CM | POA: Diagnosis not present

## 2023-03-14 DIAGNOSIS — Z87440 Personal history of urinary (tract) infections: Secondary | ICD-10-CM | POA: Diagnosis not present

## 2023-03-14 DIAGNOSIS — Z9181 History of falling: Secondary | ICD-10-CM | POA: Diagnosis not present

## 2023-03-14 DIAGNOSIS — I951 Orthostatic hypotension: Secondary | ICD-10-CM | POA: Diagnosis not present

## 2023-03-14 DIAGNOSIS — I130 Hypertensive heart and chronic kidney disease with heart failure and stage 1 through stage 4 chronic kidney disease, or unspecified chronic kidney disease: Secondary | ICD-10-CM | POA: Diagnosis not present

## 2023-03-14 DIAGNOSIS — N1832 Chronic kidney disease, stage 3b: Secondary | ICD-10-CM | POA: Diagnosis not present

## 2023-03-14 DIAGNOSIS — M329 Systemic lupus erythematosus, unspecified: Secondary | ICD-10-CM | POA: Diagnosis not present

## 2023-03-14 DIAGNOSIS — F32A Depression, unspecified: Secondary | ICD-10-CM | POA: Diagnosis not present

## 2023-03-14 DIAGNOSIS — J449 Chronic obstructive pulmonary disease, unspecified: Secondary | ICD-10-CM | POA: Diagnosis not present

## 2023-03-14 DIAGNOSIS — M797 Fibromyalgia: Secondary | ICD-10-CM | POA: Diagnosis not present

## 2023-03-14 DIAGNOSIS — I480 Paroxysmal atrial fibrillation: Secondary | ICD-10-CM | POA: Diagnosis not present

## 2023-03-14 DIAGNOSIS — M4856XD Collapsed vertebra, not elsewhere classified, lumbar region, subsequent encounter for fracture with routine healing: Secondary | ICD-10-CM | POA: Diagnosis not present

## 2023-03-14 DIAGNOSIS — Z961 Presence of intraocular lens: Secondary | ICD-10-CM | POA: Diagnosis not present

## 2023-03-14 DIAGNOSIS — G894 Chronic pain syndrome: Secondary | ICD-10-CM | POA: Diagnosis not present

## 2023-03-14 DIAGNOSIS — I5020 Unspecified systolic (congestive) heart failure: Secondary | ICD-10-CM | POA: Diagnosis not present

## 2023-03-14 DIAGNOSIS — Z7901 Long term (current) use of anticoagulants: Secondary | ICD-10-CM | POA: Diagnosis not present

## 2023-03-14 DIAGNOSIS — F411 Generalized anxiety disorder: Secondary | ICD-10-CM | POA: Diagnosis not present

## 2023-03-14 DIAGNOSIS — K219 Gastro-esophageal reflux disease without esophagitis: Secondary | ICD-10-CM | POA: Diagnosis not present

## 2023-03-14 DIAGNOSIS — G4700 Insomnia, unspecified: Secondary | ICD-10-CM | POA: Diagnosis not present

## 2023-03-14 DIAGNOSIS — E782 Mixed hyperlipidemia: Secondary | ICD-10-CM | POA: Diagnosis not present

## 2023-03-21 DIAGNOSIS — J449 Chronic obstructive pulmonary disease, unspecified: Secondary | ICD-10-CM | POA: Diagnosis not present

## 2023-03-21 DIAGNOSIS — Z87440 Personal history of urinary (tract) infections: Secondary | ICD-10-CM | POA: Diagnosis not present

## 2023-03-21 DIAGNOSIS — M797 Fibromyalgia: Secondary | ICD-10-CM | POA: Diagnosis not present

## 2023-03-21 DIAGNOSIS — K580 Irritable bowel syndrome with diarrhea: Secondary | ICD-10-CM | POA: Diagnosis not present

## 2023-03-21 DIAGNOSIS — K219 Gastro-esophageal reflux disease without esophagitis: Secondary | ICD-10-CM | POA: Diagnosis not present

## 2023-03-21 DIAGNOSIS — N1832 Chronic kidney disease, stage 3b: Secondary | ICD-10-CM | POA: Diagnosis not present

## 2023-03-21 DIAGNOSIS — I130 Hypertensive heart and chronic kidney disease with heart failure and stage 1 through stage 4 chronic kidney disease, or unspecified chronic kidney disease: Secondary | ICD-10-CM | POA: Diagnosis not present

## 2023-03-21 DIAGNOSIS — I5020 Unspecified systolic (congestive) heart failure: Secondary | ICD-10-CM | POA: Diagnosis not present

## 2023-03-21 DIAGNOSIS — Z7901 Long term (current) use of anticoagulants: Secondary | ICD-10-CM | POA: Diagnosis not present

## 2023-03-21 DIAGNOSIS — F32A Depression, unspecified: Secondary | ICD-10-CM | POA: Diagnosis not present

## 2023-03-21 DIAGNOSIS — I951 Orthostatic hypotension: Secondary | ICD-10-CM | POA: Diagnosis not present

## 2023-03-21 DIAGNOSIS — Z9181 History of falling: Secondary | ICD-10-CM | POA: Diagnosis not present

## 2023-03-21 DIAGNOSIS — M4856XD Collapsed vertebra, not elsewhere classified, lumbar region, subsequent encounter for fracture with routine healing: Secondary | ICD-10-CM | POA: Diagnosis not present

## 2023-03-21 DIAGNOSIS — M329 Systemic lupus erythematosus, unspecified: Secondary | ICD-10-CM | POA: Diagnosis not present

## 2023-03-21 DIAGNOSIS — G894 Chronic pain syndrome: Secondary | ICD-10-CM | POA: Diagnosis not present

## 2023-03-21 DIAGNOSIS — F411 Generalized anxiety disorder: Secondary | ICD-10-CM | POA: Diagnosis not present

## 2023-03-21 DIAGNOSIS — I480 Paroxysmal atrial fibrillation: Secondary | ICD-10-CM | POA: Diagnosis not present

## 2023-03-21 DIAGNOSIS — Z961 Presence of intraocular lens: Secondary | ICD-10-CM | POA: Diagnosis not present

## 2023-03-21 DIAGNOSIS — E782 Mixed hyperlipidemia: Secondary | ICD-10-CM | POA: Diagnosis not present

## 2023-03-21 DIAGNOSIS — G4700 Insomnia, unspecified: Secondary | ICD-10-CM | POA: Diagnosis not present

## 2023-03-23 ENCOUNTER — Emergency Department: Payer: PPO

## 2023-03-23 ENCOUNTER — Other Ambulatory Visit: Payer: Self-pay

## 2023-03-23 ENCOUNTER — Encounter: Payer: Self-pay | Admitting: Emergency Medicine

## 2023-03-23 ENCOUNTER — Inpatient Hospital Stay
Admission: EM | Admit: 2023-03-23 | Discharge: 2023-03-29 | DRG: 082 | Disposition: A | Discharge status: Skilled Nursing Facility | Payer: PPO | Attending: Internal Medicine | Admitting: Internal Medicine

## 2023-03-23 DIAGNOSIS — Z79899 Other long term (current) drug therapy: Secondary | ICD-10-CM

## 2023-03-23 DIAGNOSIS — M797 Fibromyalgia: Secondary | ICD-10-CM | POA: Diagnosis present

## 2023-03-23 DIAGNOSIS — Z681 Body mass index (BMI) 19 or less, adult: Secondary | ICD-10-CM

## 2023-03-23 DIAGNOSIS — R42 Dizziness and giddiness: Secondary | ICD-10-CM | POA: Diagnosis not present

## 2023-03-23 DIAGNOSIS — E43 Unspecified severe protein-calorie malnutrition: Secondary | ICD-10-CM | POA: Insufficient documentation

## 2023-03-23 DIAGNOSIS — I130 Hypertensive heart and chronic kidney disease with heart failure and stage 1 through stage 4 chronic kidney disease, or unspecified chronic kidney disease: Secondary | ICD-10-CM | POA: Diagnosis not present

## 2023-03-23 DIAGNOSIS — Z803 Family history of malignant neoplasm of breast: Secondary | ICD-10-CM

## 2023-03-23 DIAGNOSIS — F411 Generalized anxiety disorder: Secondary | ICD-10-CM | POA: Diagnosis not present

## 2023-03-23 DIAGNOSIS — Z87440 Personal history of urinary (tract) infections: Secondary | ICD-10-CM

## 2023-03-23 DIAGNOSIS — M4856XA Collapsed vertebra, not elsewhere classified, lumbar region, initial encounter for fracture: Secondary | ICD-10-CM | POA: Diagnosis present

## 2023-03-23 DIAGNOSIS — K140 Glossitis: Secondary | ICD-10-CM | POA: Insufficient documentation

## 2023-03-23 DIAGNOSIS — I5032 Chronic diastolic (congestive) heart failure: Secondary | ICD-10-CM | POA: Diagnosis not present

## 2023-03-23 DIAGNOSIS — S2249XA Multiple fractures of ribs, unspecified side, initial encounter for closed fracture: Secondary | ICD-10-CM | POA: Insufficient documentation

## 2023-03-23 DIAGNOSIS — F32A Depression, unspecified: Secondary | ICD-10-CM | POA: Diagnosis present

## 2023-03-23 DIAGNOSIS — E876 Hypokalemia: Secondary | ICD-10-CM | POA: Diagnosis present

## 2023-03-23 DIAGNOSIS — J449 Chronic obstructive pulmonary disease, unspecified: Secondary | ICD-10-CM | POA: Diagnosis present

## 2023-03-23 DIAGNOSIS — R748 Abnormal levels of other serum enzymes: Secondary | ICD-10-CM | POA: Diagnosis present

## 2023-03-23 DIAGNOSIS — Z9882 Breast implant status: Secondary | ICD-10-CM

## 2023-03-23 DIAGNOSIS — E785 Hyperlipidemia, unspecified: Secondary | ICD-10-CM | POA: Diagnosis present

## 2023-03-23 DIAGNOSIS — K120 Recurrent oral aphthae: Secondary | ICD-10-CM | POA: Diagnosis not present

## 2023-03-23 DIAGNOSIS — Z66 Do not resuscitate: Secondary | ICD-10-CM | POA: Diagnosis not present

## 2023-03-23 DIAGNOSIS — G629 Polyneuropathy, unspecified: Secondary | ICD-10-CM | POA: Diagnosis present

## 2023-03-23 DIAGNOSIS — E559 Vitamin D deficiency, unspecified: Secondary | ICD-10-CM | POA: Diagnosis not present

## 2023-03-23 DIAGNOSIS — K760 Fatty (change of) liver, not elsewhere classified: Secondary | ICD-10-CM | POA: Diagnosis present

## 2023-03-23 DIAGNOSIS — L03115 Cellulitis of right lower limb: Secondary | ICD-10-CM | POA: Diagnosis present

## 2023-03-23 DIAGNOSIS — N179 Acute kidney failure, unspecified: Secondary | ICD-10-CM | POA: Diagnosis present

## 2023-03-23 DIAGNOSIS — S01111A Laceration without foreign body of right eyelid and periocular area, initial encounter: Secondary | ICD-10-CM | POA: Diagnosis present

## 2023-03-23 DIAGNOSIS — S32020A Wedge compression fracture of second lumbar vertebra, initial encounter for closed fracture: Secondary | ICD-10-CM | POA: Diagnosis not present

## 2023-03-23 DIAGNOSIS — M329 Systemic lupus erythematosus, unspecified: Secondary | ICD-10-CM | POA: Diagnosis present

## 2023-03-23 DIAGNOSIS — R569 Unspecified convulsions: Secondary | ICD-10-CM | POA: Diagnosis not present

## 2023-03-23 DIAGNOSIS — N183 Chronic kidney disease, stage 3 unspecified: Secondary | ICD-10-CM | POA: Diagnosis present

## 2023-03-23 DIAGNOSIS — Z91048 Other nonmedicinal substance allergy status: Secondary | ICD-10-CM

## 2023-03-23 DIAGNOSIS — Z90710 Acquired absence of both cervix and uterus: Secondary | ICD-10-CM

## 2023-03-23 DIAGNOSIS — I951 Orthostatic hypotension: Secondary | ICD-10-CM | POA: Diagnosis not present

## 2023-03-23 DIAGNOSIS — N1831 Chronic kidney disease, stage 3a: Secondary | ICD-10-CM | POA: Diagnosis present

## 2023-03-23 DIAGNOSIS — Z853 Personal history of malignant neoplasm of breast: Secondary | ICD-10-CM

## 2023-03-23 DIAGNOSIS — I4891 Unspecified atrial fibrillation: Secondary | ICD-10-CM | POA: Diagnosis not present

## 2023-03-23 DIAGNOSIS — Z91040 Latex allergy status: Secondary | ICD-10-CM

## 2023-03-23 DIAGNOSIS — Z7901 Long term (current) use of anticoagulants: Secondary | ICD-10-CM

## 2023-03-23 DIAGNOSIS — S0003XA Contusion of scalp, initial encounter: Secondary | ICD-10-CM | POA: Diagnosis not present

## 2023-03-23 DIAGNOSIS — Z87891 Personal history of nicotine dependence: Secondary | ICD-10-CM | POA: Diagnosis not present

## 2023-03-23 DIAGNOSIS — Z789 Other specified health status: Secondary | ICD-10-CM | POA: Diagnosis not present

## 2023-03-23 DIAGNOSIS — J309 Allergic rhinitis, unspecified: Secondary | ICD-10-CM | POA: Diagnosis not present

## 2023-03-23 DIAGNOSIS — Z515 Encounter for palliative care: Secondary | ICD-10-CM | POA: Diagnosis not present

## 2023-03-23 DIAGNOSIS — S2243XS Multiple fractures of ribs, bilateral, sequela: Secondary | ICD-10-CM | POA: Diagnosis not present

## 2023-03-23 DIAGNOSIS — R338 Other retention of urine: Secondary | ICD-10-CM | POA: Diagnosis not present

## 2023-03-23 DIAGNOSIS — Z887 Allergy status to serum and vaccine status: Secondary | ICD-10-CM

## 2023-03-23 DIAGNOSIS — Z9049 Acquired absence of other specified parts of digestive tract: Secondary | ICD-10-CM

## 2023-03-23 DIAGNOSIS — F1090 Alcohol use, unspecified, uncomplicated: Secondary | ICD-10-CM | POA: Diagnosis present

## 2023-03-23 DIAGNOSIS — I129 Hypertensive chronic kidney disease with stage 1 through stage 4 chronic kidney disease, or unspecified chronic kidney disease: Secondary | ICD-10-CM | POA: Diagnosis present

## 2023-03-23 DIAGNOSIS — S066XAA Traumatic subarachnoid hemorrhage with loss of consciousness status unknown, initial encounter: Secondary | ICD-10-CM | POA: Diagnosis present

## 2023-03-23 DIAGNOSIS — R4781 Slurred speech: Secondary | ICD-10-CM | POA: Diagnosis not present

## 2023-03-23 DIAGNOSIS — R0789 Other chest pain: Secondary | ICD-10-CM | POA: Diagnosis not present

## 2023-03-23 DIAGNOSIS — Z9013 Acquired absence of bilateral breasts and nipples: Secondary | ICD-10-CM

## 2023-03-23 DIAGNOSIS — Z602 Problems related to living alone: Secondary | ICD-10-CM | POA: Diagnosis present

## 2023-03-23 DIAGNOSIS — I480 Paroxysmal atrial fibrillation: Secondary | ICD-10-CM | POA: Diagnosis not present

## 2023-03-23 DIAGNOSIS — S7011XA Contusion of right thigh, initial encounter: Secondary | ICD-10-CM | POA: Diagnosis present

## 2023-03-23 DIAGNOSIS — I609 Nontraumatic subarachnoid hemorrhage, unspecified: Secondary | ICD-10-CM

## 2023-03-23 DIAGNOSIS — G4700 Insomnia, unspecified: Secondary | ICD-10-CM | POA: Diagnosis not present

## 2023-03-23 DIAGNOSIS — G894 Chronic pain syndrome: Secondary | ICD-10-CM | POA: Diagnosis not present

## 2023-03-23 DIAGNOSIS — N1832 Chronic kidney disease, stage 3b: Secondary | ICD-10-CM | POA: Diagnosis not present

## 2023-03-23 DIAGNOSIS — S065X9D Traumatic subdural hemorrhage with loss of consciousness of unspecified duration, subsequent encounter: Secondary | ICD-10-CM | POA: Diagnosis not present

## 2023-03-23 DIAGNOSIS — R55 Syncope and collapse: Secondary | ICD-10-CM | POA: Diagnosis not present

## 2023-03-23 DIAGNOSIS — R001 Bradycardia, unspecified: Secondary | ICD-10-CM | POA: Diagnosis not present

## 2023-03-23 DIAGNOSIS — S065XAA Traumatic subdural hemorrhage with loss of consciousness status unknown, initial encounter: Secondary | ICD-10-CM | POA: Diagnosis present

## 2023-03-23 DIAGNOSIS — G319 Degenerative disease of nervous system, unspecified: Secondary | ICD-10-CM | POA: Diagnosis not present

## 2023-03-23 DIAGNOSIS — G8929 Other chronic pain: Secondary | ICD-10-CM | POA: Diagnosis present

## 2023-03-23 DIAGNOSIS — M1990 Unspecified osteoarthritis, unspecified site: Secondary | ICD-10-CM | POA: Diagnosis present

## 2023-03-23 DIAGNOSIS — Z885 Allergy status to narcotic agent status: Secondary | ICD-10-CM

## 2023-03-23 DIAGNOSIS — S32020S Wedge compression fracture of second lumbar vertebra, sequela: Secondary | ICD-10-CM | POA: Diagnosis not present

## 2023-03-23 DIAGNOSIS — M545 Low back pain, unspecified: Secondary | ICD-10-CM | POA: Diagnosis not present

## 2023-03-23 DIAGNOSIS — Z9181 History of falling: Secondary | ICD-10-CM

## 2023-03-23 DIAGNOSIS — I629 Nontraumatic intracranial hemorrhage, unspecified: Secondary | ICD-10-CM

## 2023-03-23 DIAGNOSIS — W01198A Fall on same level from slipping, tripping and stumbling with subsequent striking against other object, initial encounter: Secondary | ICD-10-CM | POA: Diagnosis present

## 2023-03-23 DIAGNOSIS — K219 Gastro-esophageal reflux disease without esophagitis: Secondary | ICD-10-CM | POA: Diagnosis not present

## 2023-03-23 DIAGNOSIS — M800AXD Age-related osteoporosis with current pathological fracture, other site, subsequent encounter for fracture with routine healing: Secondary | ICD-10-CM | POA: Diagnosis not present

## 2023-03-23 DIAGNOSIS — F419 Anxiety disorder, unspecified: Secondary | ICD-10-CM | POA: Diagnosis present

## 2023-03-23 DIAGNOSIS — Z7189 Other specified counseling: Secondary | ICD-10-CM | POA: Diagnosis not present

## 2023-03-23 DIAGNOSIS — W19XXXD Unspecified fall, subsequent encounter: Secondary | ICD-10-CM | POA: Diagnosis not present

## 2023-03-23 DIAGNOSIS — N390 Urinary tract infection, site not specified: Secondary | ICD-10-CM | POA: Diagnosis present

## 2023-03-23 DIAGNOSIS — I10 Essential (primary) hypertension: Secondary | ICD-10-CM | POA: Diagnosis not present

## 2023-03-23 DIAGNOSIS — E7800 Pure hypercholesterolemia, unspecified: Secondary | ICD-10-CM | POA: Diagnosis not present

## 2023-03-23 DIAGNOSIS — W19XXXA Unspecified fall, initial encounter: Secondary | ICD-10-CM | POA: Diagnosis not present

## 2023-03-23 DIAGNOSIS — S199XXA Unspecified injury of neck, initial encounter: Secondary | ICD-10-CM | POA: Diagnosis not present

## 2023-03-23 DIAGNOSIS — M546 Pain in thoracic spine: Secondary | ICD-10-CM | POA: Diagnosis not present

## 2023-03-23 DIAGNOSIS — Y92009 Unspecified place in unspecified non-institutional (private) residence as the place of occurrence of the external cause: Secondary | ICD-10-CM

## 2023-03-23 DIAGNOSIS — Z881 Allergy status to other antibiotic agents status: Secondary | ICD-10-CM

## 2023-03-23 DIAGNOSIS — R296 Repeated falls: Secondary | ICD-10-CM | POA: Diagnosis present

## 2023-03-23 DIAGNOSIS — S2243XA Multiple fractures of ribs, bilateral, initial encounter for closed fracture: Secondary | ICD-10-CM | POA: Diagnosis not present

## 2023-03-23 DIAGNOSIS — Z8673 Personal history of transient ischemic attack (TIA), and cerebral infarction without residual deficits: Secondary | ICD-10-CM

## 2023-03-23 DIAGNOSIS — Z9109 Other allergy status, other than to drugs and biological substances: Secondary | ICD-10-CM

## 2023-03-23 DIAGNOSIS — Z961 Presence of intraocular lens: Secondary | ICD-10-CM | POA: Diagnosis present

## 2023-03-23 DIAGNOSIS — S065X0A Traumatic subdural hemorrhage without loss of consciousness, initial encounter: Secondary | ICD-10-CM | POA: Diagnosis not present

## 2023-03-23 DIAGNOSIS — R945 Abnormal results of liver function studies: Secondary | ICD-10-CM | POA: Diagnosis not present

## 2023-03-23 DIAGNOSIS — S0191XA Laceration without foreign body of unspecified part of head, initial encounter: Secondary | ICD-10-CM | POA: Diagnosis not present

## 2023-03-23 LAB — COMPREHENSIVE METABOLIC PANEL
ALT: 31 U/L (ref 0–44)
AST: 58 U/L — ABNORMAL HIGH (ref 15–41)
Albumin: 2.9 g/dL — ABNORMAL LOW (ref 3.5–5.0)
Alkaline Phosphatase: 397 U/L — ABNORMAL HIGH (ref 38–126)
Anion gap: 13 (ref 5–15)
BUN: 22 mg/dL (ref 8–23)
CO2: 21 mmol/L — ABNORMAL LOW (ref 22–32)
Calcium: 8 mg/dL — ABNORMAL LOW (ref 8.9–10.3)
Chloride: 105 mmol/L (ref 98–111)
Creatinine, Ser: 1.26 mg/dL — ABNORMAL HIGH (ref 0.44–1.00)
GFR, Estimated: 44 mL/min — ABNORMAL LOW (ref >60)
Glucose, Bld: 93 mg/dL (ref 70–99)
Potassium: 3.4 mmol/L — ABNORMAL LOW (ref 3.5–5.1)
Sodium: 139 mmol/L (ref 135–145)
Total Bilirubin: 1.8 mg/dL — ABNORMAL HIGH (ref 0.3–1.2)
Total Protein: 6.8 g/dL (ref 6.5–8.1)

## 2023-03-23 LAB — PROTIME-INR
INR: 1.4 — ABNORMAL HIGH (ref 0.8–1.2)
Prothrombin Time: 16.9 seconds — ABNORMAL HIGH (ref 11.4–15.2)

## 2023-03-23 LAB — CBC
HCT: 41.1 % (ref 36.0–46.0)
Hemoglobin: 13.3 g/dL (ref 12.0–15.0)
MCH: 30.6 pg (ref 26.0–34.0)
MCHC: 32.4 g/dL (ref 30.0–36.0)
MCV: 94.5 fL (ref 80.0–100.0)
Platelets: 214 10*3/uL (ref 150–400)
RBC: 4.35 MIL/uL (ref 3.87–5.11)
RDW: 15.3 % (ref 11.5–15.5)
WBC: 9.7 10*3/uL (ref 4.0–10.5)
nRBC: 0 % (ref 0.0–0.2)

## 2023-03-23 LAB — URINALYSIS, ROUTINE W REFLEX MICROSCOPIC
Bilirubin Urine: NEGATIVE
Glucose, UA: NEGATIVE mg/dL
Hgb urine dipstick: NEGATIVE
Ketones, ur: 20 mg/dL — AB
Leukocytes,Ua: NEGATIVE
Nitrite: NEGATIVE
Protein, ur: NEGATIVE mg/dL
Specific Gravity, Urine: 1.011 (ref 1.005–1.030)
pH: 5 (ref 5.0–8.0)

## 2023-03-23 LAB — CK: Total CK: 73 U/L (ref 38–234)

## 2023-03-23 LAB — TROPONIN I (HIGH SENSITIVITY): Troponin I (High Sensitivity): 21 ng/L — ABNORMAL HIGH (ref <18)

## 2023-03-23 LAB — APTT: aPTT: 34 seconds (ref 24–36)

## 2023-03-23 MED ORDER — METOPROLOL TARTRATE 5 MG/5ML IV SOLN
5.0000 mg | Freq: Once | INTRAVENOUS | Status: AC
  Administered 2023-03-23: 5 mg via INTRAVENOUS
  Filled 2023-03-23: qty 5

## 2023-03-23 MED ORDER — HYDROCODONE-ACETAMINOPHEN 5-325 MG PO TABS
1.0000 | ORAL_TABLET | Freq: Once | ORAL | Status: AC
  Administered 2023-03-23: 1 via ORAL
  Filled 2023-03-23: qty 1

## 2023-03-23 MED ORDER — SODIUM CHLORIDE 0.9 % IV BOLUS
1000.0000 mL | Freq: Once | INTRAVENOUS | Status: AC
  Administered 2023-03-23: 1000 mL via INTRAVENOUS

## 2023-03-23 NOTE — ED Triage Notes (Signed)
Pt ems from home for fall. Pt is unsure why/ how she fell. Pt with right temple lac and bruising and c/o head, neck pain. Pt also said that when she urinated today it burned. Pt with hx frequent uti, TIA, afib. Cbg 117

## 2023-03-23 NOTE — H&P (Signed)
History and Physical     Patient: Donna Evans LMB:[id number] DOB: 1946/10/16 DOA: 03/23/2023 DOS: the patient was seen and examined on 03/24/2023 PCP: Sherron Monday, MD   Patient coming from: Home Cardiology: Dr.Paraschos @ [facility]. Chief Complaint: Fall  HISTORY OF PRESENT ILLNESS: Donna Evans is an 77 y.o. female seen in the emergency room today brought by EMS for a fall.  And although patient is not sure of how she fell she has a bruise on her right side of the head and complains of a headache and hitting her head.  Patient also reported dysuria she has a history of UTIs patient also has past medical history of TIA A-fib, hypertension, GERD.  Patient reports right-sided rib pain that is worse with taking a deep breath.  Patient has bruising on the right thigh and on the skin in her hands.  Patient is alert awake oriented states that she drinks 1 shot every 3 days.  She does not drink heavy she does not think.  She does not smoke.  Her husband has died last year.  Her son passed few years ago while waiting for kidney transplant she is alone and her healthcare power of attorney is with will Ms. Toniann Fail and her nephew both her names are on her healthcare power of attorney.  She lives in [place] and he lives in [place].  Patient does not complain anything else. No history of alcohol withdrawal or complications in past. Due to pt's history of falls and bleed any anticoagulation is high risk of bleed and death.   Past Medical History:  Diagnosis Date   Allergic state    Anginal pain    Prinzmetal's angina   Anxiety    Arthritis    osteoarthritis   Breast cancer 1990   right breast cancer   Chronic pain    Chronic pain    COPD (chronic obstructive pulmonary disease)    Depression    Edema    Fibromyalgia    Foot fracture    Bilateral   GERD (gastroesophageal reflux disease)    History of kidney stones    Hypertension    Leg fracture, right    Low back pain     Lumbosacral neuritis    Medulloadrenal hyperfunction    Migraine headache    Peripheral neuropathy    Shoulder fracture, right    Stroke    TIA   Systemic lupus erythematosus    Thyroid disease    TIA (transient ischemic attack) 11/28/2013   Transient global amnesia 2011   Review of Systems  Cardiovascular:  Positive for chest pain.       Rib pain from fall and worse with deep inspiration.  Neurological:  Positive for headaches.       Fall   Allergies  Allergen Reactions   Ciprofloxacin Nausea And Vomiting and Hives   Codeine Anaphylaxis, Hives and Other (See Comments)    Reaction:  Unknown   Fluconazole Hives   Latex Anaphylaxis and Rash   Meperidine Anaphylaxis and Hives    Reaction:  Unknown  Reaction:  Unknown   Unknown   Morphine And Related Hives, Anaphylaxis and Other (See Comments)    Reaction:  Unknown   unknown  Other reaction(s): Other (See Comments)  unknown   Doxycycline Nausea And Vomiting   Flagyl [Metronidazole] Nausea And Vomiting    Reaction:  Unknown    Influenza Vaccines Swelling    Localized swelling   Tape Other (See  Comments)    Pt allergic to Adhesive tape and latex.(  Skin breaks out)   Valtrex [Valacyclovir Hcl] Hives   Buprenorphine     Other reaction(s): Other (see comments) unknown   Duloxetine Other (See Comments)    Other Reaction(s): GI intolerance  Reaction:  Unknown     Reaction:  Unknown     Reaction:  Unknown   Silicone Other (See Comments)    Pt allergic to Adhesive tape and latex.(  Skin breaks out)   Tramadol     Other reaction(s): Not available   Amoxicillin-Pot Clavulanate Nausea And Vomiting   Sulfamethoxazole-Trimethoprim Nausea And Vomiting and Rash   Valacyclovir Rash   Past Surgical History:  Procedure Laterality Date   ABDOMINAL HYSTERECTOMY  1981   APPENDECTOMY  1957   AUGMENTATION MAMMAPLASTY Bilateral 1990   saline submuscular   BACK SURGERY  1989   BILATERAL TOTAL MASTECTOMY WITH AXILLARY LYMPH  NODE DISSECTION  1990   BREAST IMPLANT EXCHANGE Bilateral 05/11/2016   Procedure: REMOVAL AND REPLACEMENT OF BREAST IMPLANTS ;  Surgeon: Glenna Fellows, MD;  Location: [facility];  Service: Plastics;  Laterality: Bilateral;   CAPSULECTOMY Bilateral 05/11/2016   Procedure: CAPSULECTOMY CAPSULORRAPHY ;  Surgeon: Glenna Fellows, MD;  Location: [facility];  Service: Plastics;  Laterality: Bilateral;   CATARACT EXTRACTION W/PHACO Right 12/26/2018   Procedure: CATARACT EXTRACTION PHACO AND INTRAOCULAR LENS PLACEMENT (IOC) RIGHT;  Surgeon: Galen Manila, MD;  Location: [facility];  Service: Ophthalmology;  Laterality: Right;  [place]  00:46 CDE 8.50 Fluid pack lot # [id number]   CATARACT EXTRACTION W/PHACO Left 01/23/2019   Procedure: CATARACT EXTRACTION PHACO AND INTRAOCULAR LENS PLACEMENT (IOC) LEFT;  Surgeon: Galen Manila, MD;  Location: [facility];  Service: Ophthalmology;  Laterality: Left;  [place]  00:45 CDE  8.41 fluid pack lot # [id number]   CHOLECYSTECTOMY  1979   COLONOSCOPY WITH PROPOFOL N/A 01/25/2018   Procedure: COLONOSCOPY WITH PROPOFOL;  Surgeon: Scot Jun, MD;  Location: [facility];  Service: Endoscopy;  Laterality: N/A;   COPD     ESOPHAGOGASTRODUODENOSCOPY (EGD) WITH PROPOFOL N/A 03/07/2018   Procedure: ESOPHAGOGASTRODUODENOSCOPY (EGD) WITH PROPOFOL;  Surgeon: Scot Jun, MD;  Location: [facility];  Service: Endoscopy;  Laterality: N/A;   FRACTURE SURGERY     HARDWARE REMOVAL Left 06/18/2020   Procedure: HARDWARE REMOVAL;  Surgeon: Lyndle Herrlich, MD;  Location: [facility];  Service: Orthopedics;  Laterality: Left;   HUMERUS IM NAIL Left 12/31/2019   Procedure: INTRAMEDULLARY (IM) NAIL HUMERAL;  Surgeon: Lyndle Herrlich, MD;  Location: [facility];  Service: Orthopedics;  Laterality: Left;   LIPOSUCTION Right 05/11/2016   Procedure: LIPOSUCTION;  Surgeon: Glenna Fellows, MD;  Location: [facility];  Service: Plastics;   Laterality: Right;   LIVER BIOPSY  2011   MASTECTOMY SUBCUTANEOUS Bilateral 1990   MASTOPEXY Bilateral 05/11/2016   Procedure: MASTOPEXY BILATERAL ;  Surgeon: Glenna Fellows, MD;  Location: [facility];  Service: Plastics;  Laterality: Bilateral;   MEDICATIONS: Prior to Admission medications   Medication Sig Start Date End Date Taking? Authorizing Provider  acetaminophen (TYLENOL) 500 MG tablet Take 1,000 mg by mouth every 6 (six) hours as needed.   Yes [provider]  amiodarone (PACERONE) 200 MG tablet Take 1 tablet (200 mg total) by mouth 2 (two) times daily for 12 days, THEN 1 tablet (200 mg total) daily. Patient taking differently: Take 1 tablet daily 11/18/22 03/23/23 Yes Narda Bonds, MD  busPIRone (BUSPAR) 5 MG tablet Take 1 tablet (5 mg total) by mouth 2 (two) times daily. 02/07/23 05/08/23 Yes Sherron Monday, MD  calcium carbonate (TUMS - DOSED IN MG ELEMENTAL CALCIUM) 500 MG chewable tablet Chew 1 tablet by mouth 3 (three) times daily as needed for indigestion or heartburn.   Yes [provider]  cyclobenzaprine (FLEXERIL) 10 MG tablet Take 10 mg by mouth 3 (three) times daily. 12/27/22  Yes [provider]  feeding supplement (ENSURE ENLIVE / ENSURE PLUS) LIQD Take 237 mLs by mouth 2 (two) times daily between meals. 02/03/23  Yes Sreenath, Sudheer B, MD  folic acid (FOLVITE) 1 MG tablet Take 1 tablet (1 mg total) by mouth daily. 10/18/22  Yes Sunnie Nielsen, DO  metoprolol tartrate (LOPRESSOR) 25 MG tablet Take 1 tablet (25 mg total) by mouth 2 (two) times daily. 02/03/23  Yes Sreenath, Sudheer B, MD  midodrine (PROAMATINE) 2.5 MG tablet Take 1 tablet (2.5 mg total) by mouth 3 (three) times daily with meals. Patient taking differently: Take 2.5 mg by mouth daily. 11/18/22  Yes Narda Bonds, MD  rosuvastatin (CRESTOR) 10 MG tablet Take 10 mg by mouth at bedtime. 02/17/23  Yes [provider]  Suvorexant (BELSOMRA) 10 MG TABS Take  1 tablet (10 mg total) by mouth daily. 03/08/23 06/06/23 Yes Sherron Monday, MD  torsemide (DEMADEX) 20 MG tablet Take 20 mg by mouth daily as needed. 03/01/23  Yes [provider]  Vitamin D, Ergocalciferol, 50000 units CAPS Take 1 capsule by mouth once a week. 07/23/22  Yes [provider]  alum & mag hydroxide-simeth (MAALOX/MYLANTA) 200-200-20 MG/5ML suspension Take 15 mLs by mouth every 6 (six) hours as needed for indigestion or heartburn. Patient not taking: Reported on 02/07/2023    [provider]  apixaban (ELIQUIS) 5 MG TABS tablet Take 5 mg by mouth 2 (two) times daily. Patient not taking: Reported on 03/23/2023 03/16/23   [provider]  lidocaine (LIDODERM) 5 % Place 1 patch onto the skin daily. Remove & Discard patch within 12 hours or as directed by MD. Apply to back. Patient not taking: Reported on 03/23/2023 11/19/22   Narda Bonds, MD   ED Course: Pt in Ed meets SIRS criteria although no infection is suspected. Afebrile O2 sats of 100% on room air.  Vitals:   03/23/23 2345 03/24/23 0000 03/24/23 0030 03/24/23 0100  BP: (!) 150/76 137/82 134/81 (!) 149/89  Pulse: 97 92 (!) 101 (!) 105  Resp: 19 17 17 17   Temp:      TempSrc:      SpO2: 98% 98% 97% 98%  Weight:      Height:       Total I/O In: -  Out: 700 [Urine:700] SpO2: 98 % >Blood work in ed shows: Normal CBC. Hypokalemia 3.4, creatinine showing AKI of 1.26 and EGFR 44, total bili of 1.8, normal CPK. Initial troponin of 21. Abnormal alk phos of 397 improved from previously. Chest x-ray shows bilateral chronic rib fractures. EKG today shows a flutter, right axis deviation, right bundle branch block, no ST-T wave changes otherwise. CT imaging shows: 1. 2 mm LEFT anterior parafalcine subdural hematoma and 3 mm  anterior LEFT temporal subdural hematoma. LEFT temporal subarachnoid  hemorrhage and possibly RIGHT sylvian fissure subarachnoid  hemorrhage. No evidence of mass effect  or midline shift.  2. Atrophy and chronic small-vessel white matter ischemic changes.  3. No evidence of acute injury to the cervical spine.  Multilevel  degenerative changes within the cervical spine contributing to  multilevel central spinal and bony foraminal narrowing.   Repeat CT head per neurosurgery recommendation showed Stable left temporal subdural and subarachnoid hemorrhages. Stable left parafalcine subdural hematoma. With recommendations for conservative medical management and repeat imaging if neurological deterioration or signs or symptoms. New area of hemorrhage in the anterior right temporal lobe, 1.2 cm.   Results for orders placed or performed during the hospital encounter of 03/23/23 (from the past 72 hour(s))  CBC     Status: None   Collection Time: 03/23/23  2:57 PM  Result Value Ref Range   WBC 9.7 4.0 - 10.5 K/uL   RBC 4.35 3.87 - 5.11 MIL/uL   Hemoglobin 13.3 12.0 - 15.0 g/dL   HCT 40.9 81.1 - 91.4 %   MCV 94.5 80.0 - 100.0 fL   MCH 30.6 26.0 - 34.0 pg   MCHC 32.4 30.0 - 36.0 g/dL   RDW 78.2 95.6 - 21.3 %   Platelets 214 150 - 400 K/uL   nRBC 0.0 0.0 - 0.2 %    Comment: Performed at [facility], [address] [place], [place] [address]  Comprehensive metabolic panel     Status: Abnormal   Collection Time: 03/23/23  2:57 PM  Result Value Ref Range   Sodium 139 135 - 145 mmol/L   Potassium 3.4 (L) 3.5 - 5.1 mmol/L   Chloride 105 98 - 111 mmol/L   CO2 21 (L) 22 - 32 mmol/L   Glucose, Bld 93 70 - 99 mg/dL    Comment: Glucose reference range applies only to samples taken after fasting for at least 8 hours.   BUN 22 8 - 23 mg/dL   Creatinine, Ser 8.46 (H) 0.44 - 1.00 mg/dL   Calcium 8.0 (L) 8.9 - 10.3 mg/dL   Total Protein 6.8 6.5 - 8.1 g/dL   Albumin 2.9 (L) 3.5 - 5.0 g/dL   AST 58 (H) 15 - 41 U/L   ALT 31 0 - 44 U/L   Alkaline Phosphatase 397 (H) 38 - 126 U/L   Total Bilirubin 1.8 (H) 0.3 - 1.2 mg/dL   GFR, Estimated 44 (L) >60 mL/min     Comment: (NOTE) Calculated using the CKD-EPI Creatinine Equation (2021)    Anion gap 13 5 - 15    Comment: Performed at [facility], [address] [place], [place] [address]  CK     Status: None   Collection Time: 03/23/23  2:57 PM  Result Value Ref Range   Total CK 73 38 - 234 U/L    Comment: Performed at [facility], [address], [place], [place] [address]  Troponin I (High Sensitivity)     Status: Abnormal   Collection Time: 03/23/23  2:57 PM  Result Value Ref Range   Troponin I (High Sensitivity) 21 (H) <18 ng/L    Comment: (NOTE) Elevated high sensitivity troponin I (hsTnI) values and significant  changes across serial measurements may suggest ACS but many other  chronic and acute conditions are known to elevate hsTnI results.  Refer to the "Links" section for chest pain algorithms and additional  guidance. Performed at [facility], [address] [place], [place] [address]   Magnesium     Status: Abnormal   Collection Time: 03/23/23  2:57 PM  Result Value Ref Range   Magnesium 1.6 (L) 1.7 - 2.4 mg/dL    Comment: Performed at [facility], [address] [place], [place] [address]  Urinalysis, Routine w reflex microscopic -Urine, Clean Catch     Status: Abnormal   Collection Time: 03/23/23  4:51 PM  Result Value Ref Range   Color, Urine YELLOW (A) YELLOW   APPearance CLEAR (A) CLEAR   Specific Gravity, Urine 1.011 1.005 - 1.030   pH 5.0 5.0 - 8.0   Glucose, UA NEGATIVE NEGATIVE mg/dL   Hgb urine dipstick NEGATIVE NEGATIVE   Bilirubin Urine NEGATIVE NEGATIVE   Ketones, ur 20 (A) NEGATIVE mg/dL   Protein, ur NEGATIVE NEGATIVE mg/dL   Nitrite NEGATIVE NEGATIVE   Leukocytes,Ua NEGATIVE NEGATIVE    Comment: Performed at [facility], [address] [place], [place] [address]  Protime-INR     Status: Abnormal   Collection Time: 03/23/23  4:51 PM  Result Value Ref Range   Prothrombin Time 16.9 (H) 11.4 - 15.2  seconds   INR 1.4 (H) 0.8 - 1.2    Comment: (NOTE) INR goal varies based on device and disease states. Performed at [facility], [address] [place], [place] [address]   APTT     Status: None   Collection Time: 03/23/23  4:51 PM  Result Value Ref Range   aPTT 34 24 - 36 seconds    Comment: Performed at [facility], [address] [place], [place] [address]    Lab Results  Component Value Date   CREATININE 1.26 (H) 03/23/2023   CREATININE 1.10 (H) 02/02/2023   CREATININE 1.13 (H) 02/01/2023      Latest Ref Rng & Units 03/23/2023    2:57 PM 02/02/2023    2:57 AM 02/01/2023    6:56 PM  CMP  Glucose 70 - 99 mg/dL 93  295  621   BUN 8 - 23 mg/dL 22  23  23    Creatinine 0.44 - 1.00 mg/dL 3.08  6.57  8.46   Sodium 135 - 145 mmol/L 139  142  141   Potassium 3.5 - 5.1 mmol/L 3.4  3.4  3.0   Chloride 98 - 111 mmol/L 105  108  107   CO2 22 - 32 mmol/L 21  25  23    Calcium 8.9 - 10.3 mg/dL 8.0  8.5  8.7   Total Protein 6.5 - 8.1 g/dL 6.8   7.2   Total Bilirubin 0.3 - 1.2 mg/dL 1.8   1.1   Alkaline Phos 38 - 126 U/L 397   660   AST 15 - 41 U/L 58   37   ALT 0 - 44 U/L 31   26    Unresulted Labs (From admission, onward)     Start     Ordered   03/24/23 0500  Comprehensive metabolic panel  Tomorrow morning,   STAT        03/24/23 0052   03/24/23 0500  CBC  Tomorrow morning,   STAT        03/24/23 0052          Pt has received : Orders Placed This Encounter  Procedures   CT Head Wo Contrast    Standing Status:   Standing    Number of Occurrences:   1   CT Cervical Spine Wo Contrast    Standing Status:   Standing    Number of Occurrences:   1   DG Ribs Unilateral W/Chest Right    Standing Status:   Standing    Number of Occurrences:   1  Order Specific Question:   Reason for Exam (SYMPTOM  OR DIAGNOSIS REQUIRED)    Answer:   fall, pain   CT Head Wo Contrast    Standing Status:   Standing    Number of Occurrences:   1   MR BRAIN WO CONTRAST     Indicated only if trauma or neurological changes are present    Standing Status:   Standing    Number of Occurrences:   1    Order Specific Question:   What is the patient's sedation requirement?    Answer:   No Sedation    Order Specific Question:   Does the patient have a pacemaker or implanted devices?    Answer:   No   CBC    Standing Status:   Standing    Number of Occurrences:   1   Comprehensive metabolic panel    Standing Status:   Standing    Number of Occurrences:   1   Urinalysis, Routine w reflex microscopic -Urine, Clean Catch    Standing Status:   Standing    Number of Occurrences:   1    Order Specific Question:   Specimen Source    Answer:   Urine, Clean Catch [76]   CK    Standing Status:   Standing    Number of Occurrences:   1   Protime-INR    Standing Status:   Standing    Number of Occurrences:   1   APTT    Standing Status:   Standing    Number of Occurrences:   1   Magnesium    Standing Status:   Standing    Number of Occurrences:   1   Comprehensive metabolic panel    Standing Status:   Standing    Number of Occurrences:   1   CBC    Standing Status:   Standing    Number of Occurrences:   1   Diet regular Room service appropriate? Yes; Fluid consistency: Thin    Standing Status:   Standing    Number of Occurrences:   1    Order Specific Question:   Room service appropriate?    Answer:   Yes    Order Specific Question:   Fluid consistency:    Answer:   Thin   Insert foley catheter    Standing Status:   Standing    Number of Occurrences:   1    Order Specific Question:   Reason for Insertion    Answer:   Acute urinary retention (I&O cath for 24 hours prior to catheter insertion-Inpatient Only)   Swallow screen    Standing Status:   Standing    Number of Occurrences:   1   Cardiac monitoring    Standing Status:   Standing    Number of Occurrences:   1   Maintain IV access    Standing Status:   Standing    Number of Occurrences:   1    Vital signs    Standing Status:   Standing    Number of Occurrences:   1   Orthostatic vital signs on admission    Standing Status:   Standing    Number of Occurrences:   1   Orthostatic vital signs    Standing Status:   Standing    Number of Occurrences:   1   Notify physician (specify)    Standing Status:   Standing  Number of Occurrences:   20    Order Specific Question:   Notify Physician    Answer:   for pulse less than 55 or greater than 120    Order Specific Question:   Notify Physician    Answer:   for respiratory rate less than 12 or greater than 25    Order Specific Question:   Notify Physician    Answer:   for temperature greater than 100.5 F    Order Specific Question:   Notify Physician    Answer:   for urinary output less than 30 mL/hr for four hours    Order Specific Question:   Notify Physician    Answer:   for systolic BP less than 90 or greater than 160, diastolic BP less than 60 or greater than 100    Order Specific Question:   Notify Physician    Answer:   for new hypoxia w/ oxygen saturations < 88%   Up with assistance    Standing Status:   Standing    Number of Occurrences:   20   Daily weights    Standing Status:   Standing    Number of Occurrences:   1   If patient diabetic or glucose greater than 140 notify physician for Sliding Scale Insulin Orders    Standing Status:   Standing    Number of Occurrences:   20   Intake and output    Standing Status:   Standing    Number of Occurrences:   1   Neuro checks    Standing Status:   Standing    Number of Occurrences:   1   Apply Syncope Care Plan    Standing Status:   Standing    Number of Occurrences:   1   Initiate Oral Care Protocol    Standing Status:   Standing    Number of Occurrences:   1   Initiate Carrier Fluid Protocol    Standing Status:   Standing    Number of Occurrences:   1   Full code    Standing Status:   Standing    Number of Occurrences:   1    Order Specific Question:   By:     Answer:   Consent: discussion documented in EHR   Consult to hospitalist    Standing Status:   Standing    Number of Occurrences:   1    Order Specific Question:   Place call to:    Answer:   hospitalist    Order Specific Question:   Reason for Consult    Answer:   Admit    Order Specific Question:   Diagnosis/Clinical Info for Consult:    Answer:   fall, head bleed   Consult to neurosurgery Consult Timeframe: ROUTINE - requires response within 24 hours; Reason for Consult? SAH .    Standing Status:   Standing    Number of Occurrences:   1    Order Specific Question:   Consult Timeframe    Answer:   ROUTINE - requires response within 24 hours    Order Specific Question:   Reason for Consult?    Answer:   SAH .   EKG 12-Lead    Standing Status:   Standing    Number of Occurrences:   1   EKG 12-Lead    Call MD/App if abnormal    Standing Status:   Standing    Number of Occurrences:  1    Order Specific Question:   Notes    Answer:   syncope   EKG 12-Lead    Standing Status:   Standing    Number of Occurrences:   1   Admit to Inpatient (patient's expected length of stay will be greater than 2 midnights or inpatient only procedure)    Standing Status:   Standing    Number of Occurrences:   1    Order Specific Question:   Hospital Area    Answer:   [facility] [[id number]]    Order Specific Question:   Level of Care    Answer:   Stepdown [14]    Order Specific Question:   Covid Evaluation    Answer:   Asymptomatic - no recent exposure (last 10 days) testing not required    Order Specific Question:   Diagnosis    Answer:   Falls [phone]    Order Specific Question:   Admitting Physician    Answer:   Darrold Junker    Order Specific Question:   Attending Physician    Answer:   Darrold Junker    Order Specific Question:   Certification:    Answer:   I certify this patient will need inpatient services for at least 2 midnights    Order Specific  Question:   Estimated Length of Stay    Answer:   2    Meds ordered this encounter  Medications   sodium chloride 0.9 % bolus 1,000 mL   HYDROcodone-acetaminophen (NORCO/VICODIN) 5-325 MG per tablet 1 tablet   metoprolol tartrate (LOPRESSOR) injection 5 mg   HYDROcodone-acetaminophen (NORCO/VICODIN) 5-325 MG per tablet 1 tablet   sodium chloride flush (NS) 0.9 % injection 3 mL   lactated ringers infusion   OR Linked Order Group    acetaminophen (TYLENOL) tablet 650 mg    acetaminophen (TYLENOL) suppository 650 mg   thiamine (VITAMIN B1) injection 100 mg  Admission Imaging : CT Head Wo Contrast  Result Date: 03/23/2023 CLINICAL DATA:  Fall, head bleed EXAM: CT HEAD WITHOUT CONTRAST TECHNIQUE: Contiguous axial images were obtained from the base of the skull through the vertex without intravenous contrast. RADIATION DOSE REDUCTION: This exam was performed according to the departmental dose-optimization program which includes automated exposure control, adjustment of the mA and/or kV according to patient size and/or use of iterative reconstruction technique. COMPARISON:  Earlier today FINDINGS: Brain: Subarachnoid hemorrhage again seen in the left temporal region. The previously seen 2 mm left anterior para falcine subdural hematoma and left temporal subdural hematoma again noted measuring 3 mm, unchanged. Focus of intracerebral hemorrhage now seen in the anterior right temporal lobe, new since prior study. This measures 1.2 cm. No mass effect or midline shift. No hydrocephalus. Hematoma Vascular: No hyperdense vessel or unexpected calcification. Skull: No acute calvarial abnormality. Sinuses/Orbits: No acute findings Other: None IMPRESSION: Stable left temporal subdural and subarachnoid hemorrhages. Stable left parafalcine subdural hematoma. New area of hemorrhage in the anterior right temporal lobe, 1.2 cm. Electronically Signed   By: Charlett Nose M.D.   On: 03/23/2023 22:34   CT Head Wo  Contrast  Result Date: 03/23/2023 CLINICAL DATA:  77 year old female with fall and head and neck injury. EXAM: CT HEAD WITHOUT CONTRAST CT CERVICAL SPINE WITHOUT CONTRAST TECHNIQUE: Multidetector CT imaging of the head and cervical spine was performed following the standard protocol without intravenous contrast. Multiplanar CT image reconstructions of the cervical spine were also generated.  RADIATION DOSE REDUCTION: This exam was performed according to the departmental dose-optimization program which includes automated exposure control, adjustment of the mA and/or kV according to patient size and/or use of iterative reconstruction technique. COMPARISON:  02/01/2023 prior studies FINDINGS: CT HEAD FINDINGS Brain: A 2 mm LEFT anterior parafalcine subdural hematoma and a 3 mm anterior LEFT temporal subdural hematoma identified. LEFT temporal subarachnoid hemorrhage is also noted. Equivocal tiny amount of RIGHT sylvian fissure subarachnoid hemorrhage noted. There is no evidence of mass effect or midline shift. No infarct or hydrocephalus noted. Atrophy and chronic small-vessel white matter ischemic changes again noted. Vascular: Carotid atherosclerotic calcifications are noted. Skull: No acute abnormality Sinuses/Orbits: A very small amount of fluid in the RIGHT sphenoid sinus noted. No other acute abnormalities identified. Other: None CT CERVICAL SPINE FINDINGS Alignment: Unremarkable.  No acute subluxation. Skull base and vertebrae: No acute fracture. No primary bone lesion or focal pathologic process. Soft tissues and spinal canal: No prevertebral fluid or swelling. No visible canal hematoma. Disc levels: Multilevel degenerative disc disease and facet arthropathy are again noted contributing to multilevel central spinal and bony foraminal narrowing. Upper chest: No acute abnormality. Emphysema and biapical pleuroparenchymal scarring again noted. Other: None IMPRESSION: 1. 2 mm LEFT anterior parafalcine subdural  hematoma and 3 mm anterior LEFT temporal subdural hematoma. LEFT temporal subarachnoid hemorrhage and possibly RIGHT sylvian fissure subarachnoid hemorrhage. No evidence of mass effect or midline shift. 2. Atrophy and chronic small-vessel white matter ischemic changes. 3. No evidence of acute injury to the cervical spine. Multilevel degenerative changes within the cervical spine contributing to multilevel central spinal and bony foraminal narrowing. Critical Value/emergent results were called by telephone at the time of interpretation on 03/23/2023 at 4:08 pm to provider [facility] , who verbally acknowledged these results. Electronically Signed   By: Harmon Pier M.D.   On: 03/23/2023 16:10   CT Cervical Spine Wo Contrast  Result Date: 03/23/2023 CLINICAL DATA:  77 year old female with fall and head and neck injury. EXAM: CT HEAD WITHOUT CONTRAST CT CERVICAL SPINE WITHOUT CONTRAST TECHNIQUE: Multidetector CT imaging of the head and cervical spine was performed following the standard protocol without intravenous contrast. Multiplanar CT image reconstructions of the cervical spine were also generated. RADIATION DOSE REDUCTION: This exam was performed according to the departmental dose-optimization program which includes automated exposure control, adjustment of the mA and/or kV according to patient size and/or use of iterative reconstruction technique. COMPARISON:  02/01/2023 prior studies FINDINGS: CT HEAD FINDINGS Brain: A 2 mm LEFT anterior parafalcine subdural hematoma and a 3 mm anterior LEFT temporal subdural hematoma identified. LEFT temporal subarachnoid hemorrhage is also noted. Equivocal tiny amount of RIGHT sylvian fissure subarachnoid hemorrhage noted. There is no evidence of mass effect or midline shift. No infarct or hydrocephalus noted. Atrophy and chronic small-vessel white matter ischemic changes again noted. Vascular: Carotid atherosclerotic calcifications are noted. Skull: No acute  abnormality Sinuses/Orbits: A very small amount of fluid in the RIGHT sphenoid sinus noted. No other acute abnormalities identified. Other: None CT CERVICAL SPINE FINDINGS Alignment: Unremarkable.  No acute subluxation. Skull base and vertebrae: No acute fracture. No primary bone lesion or focal pathologic process. Soft tissues and spinal canal: No prevertebral fluid or swelling. No visible canal hematoma. Disc levels: Multilevel degenerative disc disease and facet arthropathy are again noted contributing to multilevel central spinal and bony foraminal narrowing. Upper chest: No acute abnormality. Emphysema and biapical pleuroparenchymal scarring again noted. Other: None IMPRESSION: 1. 2 mm LEFT anterior parafalcine  subdural hematoma and 3 mm anterior LEFT temporal subdural hematoma. LEFT temporal subarachnoid hemorrhage and possibly RIGHT sylvian fissure subarachnoid hemorrhage. No evidence of mass effect or midline shift. 2. Atrophy and chronic small-vessel white matter ischemic changes. 3. No evidence of acute injury to the cervical spine. Multilevel degenerative changes within the cervical spine contributing to multilevel central spinal and bony foraminal narrowing. Critical Value/emergent results were called by telephone at the time of interpretation on 03/23/2023 at 4:08 pm to provider [facility] , who verbally acknowledged these results. Electronically Signed   By: Harmon Pier M.D.   On: 03/23/2023 16:10   DG Ribs Unilateral W/Chest Right  Result Date: 03/23/2023 CLINICAL DATA:  Fall with right-sided pain. EXAM: RIGHT RIBS AND CHEST - 3+ VIEW COMPARISON:  Chest x-ray 02/01/2023.  CT of the chest 11/09/2022 FINDINGS: Again seen are healed posterior right eighth and ninth rib fractures similar to prior. Chronic posterior left eighth and ninth rib fractures are also unchanged. No new acute fractures are seen. There is no focal lung infiltrate, pleural effusion or pneumothorax. Chronic appearing deformity  of the left humeral head appears similar to prior. IMPRESSION: 1. No acute fracture. 2. Chronic bilateral rib fractures. Electronically Signed   By: Darliss Cheney M.D.   On: 03/23/2023 16:08   Physical Examination: Vitals:   03/23/23 2345 03/24/23 0000 03/24/23 0030 03/24/23 0100  BP: (!) 150/76 137/82 134/81 (!) 149/89  Pulse: 97 92 (!) 101 (!) 105  Temp:      Resp: 19 17 17 17   Height:      Weight:      SpO2: 98% 98% 97% 98%  TempSrc:      BMI (Calculated):       Physical Exam Vitals and nursing note reviewed.  Constitutional:      General: She is not in acute distress.    Appearance: She is ill-appearing. She is not toxic-appearing or diaphoretic.  HENT:     Head: Normocephalic and atraumatic.     Right Ear: Hearing and external ear normal.     Left Ear: Hearing and external ear normal.     Nose: Nose normal. No nasal deformity.     Mouth/Throat:     Lips: Pink.     Mouth: Mucous membranes are moist.     Tongue: No lesions.     Pharynx: Oropharynx is clear.  Eyes:     Extraocular Movements: Extraocular movements intact.  Cardiovascular:     Rate and Rhythm: Tachycardia present. Rhythm irregular.     Pulses: Normal pulses.     Heart sounds: Normal heart sounds.  Pulmonary:     Effort: Pulmonary effort is normal.     Breath sounds: Normal breath sounds.  Abdominal:     General: Bowel sounds are normal. There is no distension.     Palpations: Abdomen is soft. There is no mass.     Tenderness: There is no abdominal tenderness. There is no guarding.     Hernia: No hernia is present.  Musculoskeletal:     Right lower leg: No edema.     Left lower leg: No edema.  Skin:    General: Skin is warm.  Neurological:     General: No focal deficit present.     Mental Status: She is alert and oriented to person, place, and time.     Cranial Nerves: Cranial nerves 2-12 are intact.     Motor: Motor function is intact.  Psychiatric:  Attention and Perception: Attention  normal.        Mood and Affect: Mood normal.        Speech: Speech normal.        Behavior: Behavior normal. Behavior is cooperative.        Cognition and Memory: Cognition normal.     Assessment and Plan: * Fall at home, initial encounter Pt coming from home for falls , she has h/o recurrent falls and suspect 2/2 to alcohol or neuropathy. We will admit to stepdown and monitor neuro status.  PT consult. Fall precautions.      Syncope D/D of syncope is also a possibility we will also obtain MRI of brain.  Alcohol or possible underlying related depression.  Orthostatic vitals.  ? If related to a.fib rvr.  Echo 10/2022: Stable left temporal subdural and subarachnoid hemorrhages. Stable left parafalcine subdural hematoma. New area of hemorrhage in the anterior right temporal lobe, 1.2 cm.  Dimer pending.     Alcohol use Pt states she no longer drink heavy and  drinks a shot that she sips every 3rd days or so.  Thiamine 100 mg daily.  IV PPI. Aspiration/ fall/ seizure precaution. CIWA.   Subarachnoid hemorrhage Neurosurgery consulted.  No anticoagulation.     Subdural hematoma From her recent fall.  Neuro checks.  NO anticoagulation.  SCD's.   Orthostatic hypotension Orthostatic vitals and  PT consult prior to discharge.   UTI (urinary tract infection) Urinalysis    Component Value Date/Time   COLORURINE YELLOW (A) 03/23/2023 1651   APPEARANCEUR CLEAR (A) 03/23/2023 1651   LABSPEC 1.011 03/23/2023 1651   PHURINE 5.0 03/23/2023 1651   GLUCOSEU NEGATIVE 03/23/2023 1651   HGBUR NEGATIVE 03/23/2023 1651   BILIRUBINUR NEGATIVE 03/23/2023 1651   BILIRUBINUR neg 11/10/2015 1352   KETONESUR 20 (A) 03/23/2023 1651   PROTEINUR NEGATIVE 03/23/2023 1651   UROBILINOGEN negative 11/10/2015 1352   UROBILINOGEN 0.2 11/20/2013 0201   NITRITE NEGATIVE 03/23/2023 1651   LEUKOCYTESUR NEGATIVE 03/23/2023 1651  Resolved.       Stage 3b chronic kidney disease  (CKD) Lab Results  Component Value Date   CREATININE 1.26 (H) 03/23/2023   CREATININE 1.10 (H) 02/02/2023   CREATININE 1.13 (H) 02/01/2023  Avoid contrast.  Renally dose meds.    Paroxysmal atrial fibrillation with RVR HR intermittently rising.   We will continue amiodarone.  We will cont metoprolol.  Eliquis held due to [facility] and [facility]..       DVT prophylaxis:  SCD's.   Code Status:  Full Code.      03/23/2023    3:19 PM  Advanced Directives  Does Patient Have a Medical Advance Directive? No    Family Communication:  None.  Emergency Contact: Contact Information     Name Relation Home Work Mobile   Apple,[name] Niece   [phone]   Elenore Rota   [phone]   Mayme Genta   [phone]       Disposition Plan:  Home.   Consults: Neuro surgery: Dr. Myer Haff.  Admission status: Inpatient.   Unit / Expected LOS: Stepdown./ 2 days.    Gertha Calkin MD [facility]  6 PM- 2 AM. [phone]( Pager )  Please use [email or web address] to contact the current [facility] MD  based on hours  and team and services or  You may call [phone] to contact current Assigned [facility] Attending/Consulting MD for this patient.  This number is the [facility] ADMIT/Consult system  and will be able to assist  any patient in any [place] location .

## 2023-03-23 NOTE — H&P (Incomplete)
History and Physical     Patient: Donna Evans:[id number] DOB: [birth year] DOA: 03/23/2023 DOS: the patient was seen and examined on 03/24/2023 PCP: Donna Monday, MD   Patient coming from: Home  Chief Complaint: Fall  HISTORY OF PRESENT ILLNESS: Donna Evans is an 77 y.o. female seen in the emergency room today brought by EMS for Evans fall.  And although patient is not sure of how she fell she has Evans bruise on her right side of the head and complains of Evans headache and hitting her head.  Patient also reported dysuria she has Evans history of UTIs patient also has past medical history of TIA Evans-fib, hypertension, GERD.  Patient reports right-sided rib pain that is worse with taking Evans deep breath.  Past Medical History:  Diagnosis Date  . Allergic state   . Anginal pain    Prinzmetal's angina  . Anxiety   . Arthritis    osteoarthritis  . Breast cancer 1990   right breast cancer  . Chronic pain   . Chronic pain   . COPD (chronic obstructive pulmonary disease)   . Depression   . Edema   . Fibromyalgia   . Foot fracture    Bilateral  . GERD (gastroesophageal reflux disease)   . History of kidney stones   . Hypertension   . Leg fracture, right   . Low back pain   . Lumbosacral neuritis   . Medulloadrenal hyperfunction   . Migraine headache   . Peripheral neuropathy   . Shoulder fracture, right   . Stroke    TIA  . Systemic lupus erythematosus   . Thyroid disease   . TIA (transient ischemic attack) 11/28/2013  . Transient global amnesia 2011   Review of Systems  Cardiovascular:  Positive for chest pain.       Rib pain from fall and worse with deep inspiration.  Neurological:  Positive for headaches.       Fall   Allergies  Allergen Reactions  . Ciprofloxacin Nausea And Vomiting and Hives  . Codeine Anaphylaxis, Hives and Other (See Comments)    Reaction:  Unknown  . Fluconazole Hives  . Latex Anaphylaxis and Rash  . Meperidine Anaphylaxis and Hives     Reaction:  Unknown  Reaction:  Unknown   Unknown  . Morphine And Related Hives, Anaphylaxis and Other (See Comments)    Reaction:  Unknown   unknown  Other reaction(s): Other (See Comments)  unknown  . Doxycycline Nausea And Vomiting  . Flagyl [Metronidazole] Nausea And Vomiting    Reaction:  Unknown   . Influenza Vaccines Swelling    Localized swelling  . Tape Other (See Comments)    Pt allergic to Adhesive tape and latex.(  Skin breaks out)  . Valtrex [Valacyclovir Hcl] Hives  . Buprenorphine     Other reaction(s): Other (see comments) unknown  . Duloxetine Other (See Comments)    Other Reaction(s): GI intolerance  Reaction:  Unknown     Reaction:  Unknown     Reaction:  Unknown  . Silicone Other (See Comments)    Pt allergic to Adhesive tape and latex.(  Skin breaks out)  . Tramadol     Other reaction(s): Not available  . Amoxicillin-Pot Clavulanate Nausea And Vomiting  . Sulfamethoxazole-Trimethoprim Nausea And Vomiting and Rash  . Valacyclovir Rash   Past Surgical History:  Procedure Laterality Date  . ABDOMINAL HYSTERECTOMY  1981  . APPENDECTOMY  1957  . AUGMENTATION MAMMAPLASTY  Bilateral 1990   saline submuscular  . BACK SURGERY  1989  . BILATERAL TOTAL MASTECTOMY WITH AXILLARY LYMPH NODE DISSECTION  1990  . BREAST IMPLANT EXCHANGE Bilateral 05/11/2016   Procedure: REMOVAL AND REPLACEMENT OF BREAST IMPLANTS ;  Surgeon: Donna FellowsBrinda Thimmappa, MD;  Location: [facility];  Service: Plastics;  Laterality: Bilateral;  . CAPSULECTOMY Bilateral 05/11/2016   Procedure: CAPSULECTOMY CAPSULORRAPHY ;  Surgeon: Donna FellowsBrinda Thimmappa, MD;  Location: [facility];  Service: Plastics;  Laterality: Bilateral;  . CATARACT EXTRACTION W/PHACO Right 12/26/2018   Procedure: CATARACT EXTRACTION PHACO AND INTRAOCULAR LENS PLACEMENT (IOC) RIGHT;  Surgeon: Donna Evans, William, MD;  Location: [facility];  Service: Ophthalmology;  Laterality: Right;  US  00:46 CDE 8.50 Fluid  pack lot # [id number]  . CATARACT EXTRACTION W/PHACO Left 01/23/2019   Procedure: CATARACT EXTRACTION PHACO AND INTRAOCULAR LENS PLACEMENT (IOC) LEFT;  Surgeon: Donna Evans, William, MD;  Location: [facility];  Service: Ophthalmology;  Laterality: Left;  US  00:45 CDE  8.41 fluid pack lot # [id number]  . CHOLECYSTECTOMY  1979  . COLONOSCOPY WITH PROPOFOL N/Evans 01/25/2018   Procedure: COLONOSCOPY WITH PROPOFOL;  Surgeon: Donna Evans, Donna T, MD;  [facility];  Service: Endoscopy;  Laterality: N/Evans;  . COPD    . ESOPHAGOGASTRODUODENOSCOPY (EGD) WITH PROPOFOL N/Evans 03/07/2018   Procedure: ESOPHAGOGASTRODUODENOSCOPY (EGD) WITH PROPOFOL;  Surgeon: Donna Evans, Donna T, MD;  [facility];  Service: Endoscopy;  Laterality: N/Evans;  . FRACTURE SURGERY    . HARDWARE REMOVAL Left 06/18/2020   Procedure: HARDWARE REMOVAL;  Surgeon: Donna Evans, Donna R, MD;  Location: [facility];  Service: Orthopedics;  Laterality: Left;  . HUMERUS IM NAIL Left 12/31/2019   Procedure: INTRAMEDULLARY (IM) NAIL HUMERAL;  Surgeon: Donna Evans, Donna R, MD;  Location: [facility];  Service: Orthopedics;  Laterality: Left;  . LIPOSUCTION Right 05/11/2016   Procedure: LIPOSUCTION;  Surgeon: Donna FellowsBrinda Thimmappa, MD;  Location: [facility];  Service: Plastics;  Laterality: Right;  . LIVER BIOPSY  2011  . MASTECTOMY SUBCUTANEOUS Bilateral 1990  . MASTOPEXY Bilateral 05/11/2016   Procedure: MASTOPEXY BILATERAL ;  Surgeon: Donna FellowsBrinda Thimmappa, MD;  Location: [facility];  Service: Plastics;  Laterality: Bilateral;   MEDICATIONS: Prior to Admission medications   Medication Sig Start Date End Date Taking? Authorizing Provider  acetaminophen (TYLENOL) 500 MG tablet Take 1,000 mg by mouth every 6 (six) hours as needed.   Yes [provider]  amiodarone (PACERONE) 200 MG tablet Take 1 tablet (200 mg total) by mouth 2 (two) times daily for 12 days, THEN 1 tablet (200 mg total) daily. Patient taking differently: Take 1  tablet daily 11/18/22 03/23/23 Yes Donna Evans, Donna A, MD  busPIRone (BUSPAR) 5 MG tablet Take 1 tablet (5 mg total) by mouth 2 (two) times daily. 02/07/23 05/08/23 Yes Donna Mondayejan-Sie, S Ahmed, MD  calcium carbonate (TUMS - DOSED IN MG ELEMENTAL CALCIUM) 500 MG chewable tablet Chew 1 tablet by mouth 3 (three) times daily as needed for indigestion or heartburn.   Yes [provider]  cyclobenzaprine (FLEXERIL) 10 MG tablet Take 10 mg by mouth 3 (three) times daily. 12/27/22  Yes [provider]  feeding supplement (ENSURE ENLIVE / ENSURE PLUS) LIQD Take 237 mLs by mouth 2 (two) times daily between meals. 02/03/23  Yes Sreenath, Sudheer B, MD  folic acid (FOLVITE) 1 MG tablet Take 1 tablet (1 mg total) by mouth daily. 10/18/22  Yes Sunnie NielsenAlexander, Natalie, DO  metoprolol tartrate (LOPRESSOR) 25 MG tablet Take 1  tablet (25 mg total) by mouth 2 (two) times daily. 02/03/23  Yes Sreenath, Sudheer B, MD  midodrine (PROAMATINE) 2.5 MG tablet Take 1 tablet (2.5 mg total) by mouth 3 (three) times daily with meals. Patient taking differently: Take 2.5 mg by mouth daily. 11/18/22  Yes Donna Bonds, MD  rosuvastatin (CRESTOR) 10 MG tablet Take 10 mg by mouth at bedtime. 02/17/23  Yes [provider]  Suvorexant (BELSOMRA) 10 MG TABS Take 1 tablet (10 mg total) by mouth daily. 03/08/23 06/06/23 Yes Donna Monday, MD  torsemide (DEMADEX) 20 MG tablet Take 20 mg by mouth daily as needed. 03/01/23  Yes [provider]  Vitamin D, Ergocalciferol, 50000 units CAPS Take 1 capsule by mouth once Evans week. 07/23/22  Yes [provider]  alum & mag hydroxide-simeth (MAALOX/MYLANTA) 200-200-20 MG/5ML suspension Take 15 mLs by mouth every 6 (six) hours as needed for indigestion or heartburn. Patient not taking: Reported on 02/07/2023    [provider]  apixaban (ELIQUIS) 5 MG TABS tablet Take 5 mg by mouth 2 (two) times daily. Patient not taking: Reported on 03/23/2023 03/16/23   [provider]  lidocaine (LIDODERM) 5 % Place 1 patch onto the skin daily. Remove & Discard patch within 12 hours or as directed by MD. Apply to back. Patient not taking: Reported on 03/23/2023 11/19/22   Donna Bonds, MD   ED Course: Pt in Ed meets SIRS criteria although no infection is suspected. Afebrile O2 sats of 100% on room air.  Vitals:   03/23/23 2130 03/23/23 2220 03/23/23 2230 03/23/23 2241  BP: (!) 150/79 (!) 142/88 (!) 150/104   Pulse: (!) 121 99 90   Resp: (!) 22 13 15    Temp:    98.4 F (36.9 C)  TempSrc:    Oral  SpO2: 100% 100% 100%   Weight:      Height:       Total I/O In: -  Out: 700 [Urine:700] SpO2: 100 % Blood work in ed shows: Normal CBC. Hypokalemia 3.4, creatinine showing AKI of 1.26 and EGFR 44, total bili of 1.8, normal CPK. Initial troponin of 21. Abnormal alk phos of 397 improved from previously. Chest x-ray shows bilateral chronic rib fractures. EKG today shows Evans flutter, right axis deviation, right bundle branch block, no ST-Evans wave changes otherwise. CT imaging shows: 1. 2 mm LEFT anterior parafalcine subdural hematoma and 3 mm  anterior LEFT temporal subdural hematoma. LEFT temporal subarachnoid  hemorrhage and possibly RIGHT sylvian fissure subarachnoid  hemorrhage. No evidence of mass effect or midline shift.  2. Atrophy and chronic small-vessel white matter ischemic changes.  3. No evidence of acute injury to the cervical spine. Multilevel  degenerative changes within the cervical spine contributing to  multilevel central spinal and bony foraminal narrowing.   Repeat CT head per neurosurgery recommendation showed Stable left temporal subdural and subarachnoid hemorrhages. Stable left parafalcine subdural hematoma. With recommendations for conservative medical management and repeat imaging if neurological deterioration or signs or symptoms.  New area of hemorrhage in the anterior right temporal lobe, 1.2 cm.  Results for orders  placed or performed during the hospital encounter of 03/23/23 (from the past 72 hour(s))  CBC     Status: None   Collection Time: 03/23/23  2:57 PM  Result Value Ref Range   WBC 9.7 4.0 - 10.5 K/uL   RBC 4.35 3.87 - 5.11 MIL/uL   Hemoglobin 13.3 12.0 - 15.0 g/dL   HCT  41.1 36.0 - 46.0 %   MCV 94.5 80.0 - 100.0 fL   MCH 30.6 26.0 - 34.0 pg   MCHC 32.4 30.0 - 36.0 g/dL   RDW 16.1 09.6 - 04.5 %   Platelets 214 150 - 400 K/uL   nRBC 0.0 0.0 - 0.2 %    Comment: Performed at [facility], [address] [place], [place] [address]  Comprehensive metabolic panel     Status: Abnormal   Collection Time: 03/23/23  2:57 PM  Result Value Ref Range   Sodium 139 135 - 145 mmol/L   Potassium 3.4 (L) 3.5 - 5.1 mmol/L   Chloride 105 98 - 111 mmol/L   CO2 21 (L) 22 - 32 mmol/L   Glucose, Bld 93 70 - 99 mg/dL    Comment: Glucose reference range applies only to samples taken after fasting for at least 8 hours.   BUN 22 8 - 23 mg/dL   Creatinine, Ser 1.91 (H) 0.44 - 1.00 mg/dL   Calcium 8.0 (L) 8.9 - 10.3 mg/dL   Total Protein 6.8 6.5 - 8.1 g/dL   Albumin 2.9 (L) 3.5 - 5.0 g/dL   AST 58 (H) 15 - 41 U/L   ALT 31 0 - 44 U/L   Alkaline Phosphatase 397 (H) 38 - 126 U/L   Total Bilirubin 1.8 (H) 0.3 - 1.2 mg/dL   GFR, Estimated 44 (L) >60 mL/min    Comment: (NOTE) Calculated using the CKD-EPI Creatinine Equation (2021)    Anion gap 13 5 - 15    Comment: Performed at [facility], [address] [place], [place] [address]  CK     Status: None   Collection Time: 03/23/23  2:57 PM  Result Value Ref Range   Total CK 73 38 - 234 U/L    Comment: Performed at [facility], [address], [place], [place] [address]  Troponin I (High Sensitivity)     Status: Abnormal   Collection Time: 03/23/23  2:57 PM  Result Value Ref Range   Troponin I (High Sensitivity) 21 (H) <18 ng/L    Comment: (NOTE) Elevated high sensitivity troponin I (hsTnI) values and significant  changes  across serial measurements may suggest ACS but many other  chronic and acute conditions are known to elevate hsTnI results.  Refer to the "Links" section for chest pain algorithms and additional  guidance. Performed at [facility], [address] [place], [place] [address]   Urinalysis, Routine w reflex microscopic -Urine, Clean Catch     Status: Abnormal   Collection Time: 03/23/23  4:51 PM  Result Value Ref Range   Color, Urine YELLOW (Evans) YELLOW   APPearance CLEAR (Evans) CLEAR   Specific Gravity, Urine 1.011 1.005 - 1.030   pH 5.0 5.0 - 8.0   Glucose, UA NEGATIVE NEGATIVE mg/dL   Hgb urine dipstick NEGATIVE NEGATIVE   Bilirubin Urine NEGATIVE NEGATIVE   Ketones, ur 20 (Evans) NEGATIVE mg/dL   Protein, ur NEGATIVE NEGATIVE mg/dL   Nitrite NEGATIVE NEGATIVE   Leukocytes,Ua NEGATIVE NEGATIVE    Comment: Performed at [facility], [address] [place], [place] [address]  Protime-INR     Status: Abnormal   Collection Time: 03/23/23  4:51 PM  Result Value Ref Range   Prothrombin Time 16.9 (H) 11.4 - 15.2 seconds   INR 1.4 (H) 0.8 - 1.2    Comment: (NOTE) INR goal varies based on device and disease states. Performed at [facility], [address] [place], [place] [address]   APTT     Status: None   Collection Time: 03/23/23  4:51 PM  Result Value Ref Range   aPTT 34 24 - 36 seconds    Comment: Performed at [facility], [address] [place], [place] [address]    Lab Results  Component Value Date   CREATININE 1.26 (H) 03/23/2023   CREATININE 1.10 (H) 02/02/2023   CREATININE 1.13 (H) 02/01/2023      Latest Ref Rng & Units 03/23/2023    2:57 PM 02/02/2023    2:57 AM 02/01/2023    6:56 PM  CMP  Glucose 70 - 99 mg/dL 93  098  119   BUN 8 - 23 mg/dL 22  23  23    Creatinine 0.44 - 1.00 mg/dL 1.47  8.29  5.62   Sodium 135 - 145 mmol/L 139  142  141   Potassium 3.5 - 5.1 mmol/L 3.4  3.4  3.0   Chloride 98 - 111 mmol/L 105  108  107    CO2 22 - 32 mmol/L 21  25  23    Calcium 8.9 - 10.3 mg/dL 8.0  8.5  8.7   Total Protein 6.5 - 8.1 g/dL 6.8   7.2   Total Bilirubin 0.3 - 1.2 mg/dL 1.8   1.1   Alkaline Phos 38 - 126 U/L 397   660   AST 15 - 41 U/L 58   37   ALT 0 - 44 U/L 31   26    Unresulted Labs (From admission, onward)    None     Pt has received : Orders Placed This Encounter  Procedures  . CT Head Wo Contrast    Standing Status:   Standing    Number of Occurrences:   1  . CT Cervical Spine Wo Contrast    Standing Status:   Standing    Number of Occurrences:   1  . DG Ribs Unilateral W/Chest Right    Standing Status:   Standing    Number of Occurrences:   1    Order Specific Question:   Reason for Exam (SYMPTOM  OR DIAGNOSIS REQUIRED)    Answer:   fall, pain  . CT Head Wo Contrast    Standing Status:   Standing    Number of Occurrences:   1  . CBC    Standing Status:   Standing    Number of Occurrences:   1  . Comprehensive metabolic panel    Standing Status:   Standing    Number of Occurrences:   1  . Urinalysis, Routine w reflex microscopic -Urine, Clean Catch    Standing Status:   Standing    Number of Occurrences:   1    Order Specific Question:   Specimen Source    Answer:   Urine, Clean Catch [76]  . CK    Standing Status:   Standing    Number of Occurrences:   1  . Protime-INR    Standing Status:   Standing    Number of Occurrences:   1  . APTT    Standing Status:   Standing    Number of Occurrences:   1  . Insert foley catheter    Standing Status:   Standing    Number of Occurrences:   1    Order Specific Question:   Reason for Insertion    Answer:   Acute urinary retention (  I&O cath for 24 hours prior to catheter insertion-Inpatient Only)  . Consult to hospitalist    Standing Status:   Standing    Number of Occurrences:   1    Order Specific Question:   Place call to:    Answer:   hospitalist    Order Specific Question:   Reason for Consult    Answer:   Admit    Order  Specific Question:   Diagnosis/Clinical Info for Consult:    Answer:   fall, head bleed  . EKG 12-Lead    Standing Status:   Standing    Number of Occurrences:   1    Meds ordered this encounter  Medications  . sodium chloride 0.9 % bolus 1,000 mL  . HYDROcodone-acetaminophen (NORCO/VICODIN) 5-325 MG per tablet 1 tablet  . metoprolol tartrate (LOPRESSOR) injection 5 mg  . HYDROcodone-acetaminophen (NORCO/VICODIN) 5-325 MG per tablet 1 tablet  Admission Imaging : CT Head Wo Contrast  Result Date: 03/23/2023 CLINICAL DATA:  77 year old female with fall and head and neck injury. EXAM: CT HEAD WITHOUT CONTRAST CT CERVICAL SPINE WITHOUT CONTRAST TECHNIQUE: Multidetector CT imaging of the head and cervical spine was performed following the standard protocol without intravenous contrast. Multiplanar CT image reconstructions of the cervical spine were also generated. RADIATION DOSE REDUCTION: This exam was performed according to the departmental dose-optimization program which includes automated exposure control, adjustment of the mA and/or kV according to patient size and/or use of iterative reconstruction technique. COMPARISON:  02/01/2023 prior studies FINDINGS: CT HEAD FINDINGS Brain: Evans 2 mm LEFT anterior parafalcine subdural hematoma and Evans 3 mm anterior LEFT temporal subdural hematoma identified. LEFT temporal subarachnoid hemorrhage is also noted. Equivocal tiny amount of RIGHT sylvian fissure subarachnoid hemorrhage noted. There is no evidence of mass effect or midline shift. No infarct or hydrocephalus noted. Atrophy and chronic small-vessel white matter ischemic changes again noted. Vascular: Carotid atherosclerotic calcifications are noted. Skull: No acute abnormality Sinuses/Orbits: Evans very small amount of fluid in the RIGHT sphenoid sinus noted. No other acute abnormalities identified. Other: None CT CERVICAL SPINE FINDINGS Alignment: Unremarkable.  No acute subluxation. Skull base and vertebrae:  No acute fracture. No primary bone lesion or focal pathologic process. Soft tissues and spinal canal: No prevertebral fluid or swelling. No visible canal hematoma. Disc levels: Multilevel degenerative disc disease and facet arthropathy are again noted contributing to multilevel central spinal and bony foraminal narrowing. Upper chest: No acute abnormality. Emphysema and biapical pleuroparenchymal scarring again noted. Other: None IMPRESSION: 1. 2 mm LEFT anterior parafalcine subdural hematoma and 3 mm anterior LEFT temporal subdural hematoma. LEFT temporal subarachnoid hemorrhage and possibly RIGHT sylvian fissure subarachnoid hemorrhage. No evidence of mass effect or midline shift. 2. Atrophy and chronic small-vessel white matter ischemic changes. 3. No evidence of acute injury to the cervical spine. Multilevel degenerative changes within the cervical spine contributing to multilevel central spinal and bony foraminal narrowing. Critical Value/emergent results were called by telephone at the time of interpretation on 03/23/2023 at 4:08 pm to provider [facility] , who verbally acknowledged these results. Electronically Signed   By: Harmon Pier M.D.   On: 03/23/2023 16:10   CT Cervical Spine Wo Contrast  Result Date: 03/23/2023 CLINICAL DATA:  77 year old female with fall and head and neck injury. EXAM: CT HEAD WITHOUT CONTRAST CT CERVICAL SPINE WITHOUT CONTRAST TECHNIQUE: Multidetector CT imaging of the head and cervical spine was performed following the standard protocol without intravenous contrast. Multiplanar CT image reconstructions of the  cervical spine were also generated. RADIATION DOSE REDUCTION: This exam was performed according to the departmental dose-optimization program which includes automated exposure control, adjustment of the mA and/or kV according to patient size and/or use of iterative reconstruction technique. COMPARISON:  02/01/2023 prior studies FINDINGS: CT HEAD FINDINGS Brain: Evans 2 mm  LEFT anterior parafalcine subdural hematoma and Evans 3 mm anterior LEFT temporal subdural hematoma identified. LEFT temporal subarachnoid hemorrhage is also noted. Equivocal tiny amount of RIGHT sylvian fissure subarachnoid hemorrhage noted. There is no evidence of mass effect or midline shift. No infarct or hydrocephalus noted. Atrophy and chronic small-vessel white matter ischemic changes again noted. Vascular: Carotid atherosclerotic calcifications are noted. Skull: No acute abnormality Sinuses/Orbits: Evans very small amount of fluid in the RIGHT sphenoid sinus noted. No other acute abnormalities identified. Other: None CT CERVICAL SPINE FINDINGS Alignment: Unremarkable.  No acute subluxation. Skull base and vertebrae: No acute fracture. No primary bone lesion or focal pathologic process. Soft tissues and spinal canal: No prevertebral fluid or swelling. No visible canal hematoma. Disc levels: Multilevel degenerative disc disease and facet arthropathy are again noted contributing to multilevel central spinal and bony foraminal narrowing. Upper chest: No acute abnormality. Emphysema and biapical pleuroparenchymal scarring again noted. Other: None IMPRESSION: 1. 2 mm LEFT anterior parafalcine subdural hematoma and 3 mm anterior LEFT temporal subdural hematoma. LEFT temporal subarachnoid hemorrhage and possibly RIGHT sylvian fissure subarachnoid hemorrhage. No evidence of mass effect or midline shift. 2. Atrophy and chronic small-vessel white matter ischemic changes. 3. No evidence of acute injury to the cervical spine. Multilevel degenerative changes within the cervical spine contributing to multilevel central spinal and bony foraminal narrowing. Critical Value/emergent results were called by telephone at the time of interpretation on 03/23/2023 at 4:08 pm to provider [facility] , who verbally acknowledged these results. Electronically Signed   By: Harmon Pier M.D.   On: 03/23/2023 16:10   DG Ribs Unilateral W/Chest  Right  Result Date: 03/23/2023 CLINICAL DATA:  Fall with right-sided pain. EXAM: RIGHT RIBS AND CHEST - 3+ VIEW COMPARISON:  Chest x-ray 02/01/2023.  CT of the chest 11/09/2022 FINDINGS: Again seen are healed posterior right eighth and ninth rib fractures similar to prior. Chronic posterior left eighth and ninth rib fractures are also unchanged. No new acute fractures are seen. There is no focal lung infiltrate, pleural effusion or pneumothorax. Chronic appearing deformity of the left humeral head appears similar to prior. IMPRESSION: 1. No acute fracture. 2. Chronic bilateral rib fractures. Electronically Signed   By: Darliss Cheney M.D.   On: 03/23/2023 16:08   Physical Examination: Vitals:   03/23/23 2130 03/23/23 2220 03/23/23 2230 03/23/23 2241  BP: (!) 150/79 (!) 142/88 (!) 150/104   Pulse: (!) 121 99 90   Temp:    98.4 F (36.9 C)  Resp: (!) 22 13 15    Height:      Weight:      SpO2: 100% 100% 100%   TempSrc:    Oral  BMI (Calculated):       Physical Exam Vitals and nursing note reviewed.  Constitutional:      General: She is not in acute distress.    Appearance: She is ill-appearing. She is not toxic-appearing or diaphoretic.  HENT:     Head: Normocephalic and atraumatic.     Right Ear: Hearing and external ear normal.     Left Ear: Hearing and external ear normal.     Nose: Nose normal. No nasal deformity.  Mouth/Throat:     Lips: Pink.     Mouth: Mucous membranes are moist.     Tongue: No lesions.     Pharynx: Oropharynx is clear.  Eyes:     Extraocular Movements: Extraocular movements intact.  Cardiovascular:     Rate and Rhythm: Normal rate. Rhythm irregular.     Pulses: Normal pulses.     Heart sounds: Normal heart sounds.  Pulmonary:     Effort: Pulmonary effort is normal.     Breath sounds: Normal breath sounds.  Abdominal:     General: Bowel sounds are normal. There is no distension.     Palpations: Abdomen is soft. There is no mass.     Tenderness: There  is no abdominal tenderness. There is no guarding.     Hernia: No hernia is present.  Musculoskeletal:     Right lower leg: No edema.     Left lower leg: No edema.  Skin:    General: Skin is warm.  Neurological:     General: No focal deficit present.     Mental Status: She is alert and oriented to person, place, and time.     Cranial Nerves: Cranial nerves 2-12 are intact.     Motor: Motor function is intact.  Psychiatric:        Attention and Perception: Attention normal.        Mood and Affect: Mood normal.        Speech: Speech normal.        Behavior: Behavior normal. Behavior is cooperative.        Cognition and Memory: Cognition normal.     Assessment and Plan: No notes have been filed under this hospital service. Service: Hospitalist       DVT prophylaxis:  ***   Code Status:  ***     03/23/2023    3:19 PM  Advanced Directives  Does Patient Have [facility] Advance Directive? No    Family Communication:  ***  Emergency Contact: Contact Information     Name Relation Home Work Mobile   Apple,[name] Niece   [phone]   Elenore Rota   [phone]   Mayme Genta   [phone]       Disposition Plan:  ***  Consults: *** Admission status: ***  Unit / Expected LOS: ***   Gertha Calkin MD [facility]  6 PM- 2 AM. [phone]( Pager ) Please use [email or web address] to contact the current [facility] MD  based on hours  and team and services or  You may call [phone] to contact current Assigned [facility] Attending/Consulting MD for this patient.  This number is the [facility] ADMIT/Consult system  and will be able to assist any patient in any [place] location .

## 2023-03-23 NOTE — ED Provider Notes (Signed)
[facility] Provider Note    Event Date/Time   First MD Initiated Contact with Patient 03/23/23 1501     (approximate)   History   Fall   HPI  Donna Evans is a 77 y.o. female   who presents to the emergency department today after a fall. The patient says that she thinks she was in the bathroom when she fell. Happened today. Did hit her head and is complaining of pain in her head. Also complaining of some pain in the right ribs that makes it hard for her to get a good breath when she is ambulating. The patient complains of burning with urination. She denies any fevers.      Physical Exam   Triage Vital Signs: ED Triage Vitals  Enc Vitals Group     BP --      Pulse Rate 03/23/23 1515 (!) 112     Resp 03/23/23 1515 16     Temp 03/23/23 1515 97.9 F (36.6 C)     Temp Source 03/23/23 1515 Oral     SpO2 03/23/23 1515 100 %     Weight 03/23/23 1517 109 lb 8 oz (49.7 kg)     Height 03/23/23 1517 5\' 7"  (1.702 m)     Head Circumference --      Peak Flow --      Pain Score 03/23/23 1516 8     Pain Loc --      Pain Edu? --      Excl. in GC? --     Most recent vital signs: Vitals:   03/23/23 1515  Pulse: (!) 112  Resp: 16  Temp: 97.9 F (36.6 C)  SpO2: 100%   General: Awake, alert, not completely oriented. CV:  Good peripheral perfusion.  Resp:  Normal effort. Lungs clear. Abd:  No distention. Non tender. Other:  Tender to palpation of the right ribs. No spinal tenderness. Small laceration to right lateral eyebrow area.    ED Results / Procedures / Treatments   Labs (all labs ordered are listed, but only abnormal results are displayed) Labs Reviewed  COMPREHENSIVE METABOLIC PANEL - Abnormal; Notable for the following components:      Result Value   Potassium 3.4 (*)    CO2 21 (*)    Creatinine, Ser 1.26 (*)    Calcium 8.0 (*)    Albumin 2.9 (*)    AST 58 (*)    Alkaline Phosphatase 397 (*)    Total Bilirubin 1.8 (*)    GFR,  Estimated 44 (*)    All other components within normal limits  URINALYSIS, ROUTINE W REFLEX MICROSCOPIC - Abnormal; Notable for the following components:   Color, Urine YELLOW (*)    APPearance CLEAR (*)    Ketones, ur 20 (*)    All other components within normal limits  PROTIME-INR - Abnormal; Notable for the following components:   Prothrombin Time 16.9 (*)    INR 1.4 (*)    All other components within normal limits  TROPONIN I (HIGH SENSITIVITY) - Abnormal; Notable for the following components:   Troponin I (High Sensitivity) 21 (*)    All other components within normal limits  CBC  CK  APTT     EKG  I, Phineas Semen, attending physician, personally viewed and interpreted this EKG  EKG Time: 1531 Rate: 121 Rhythm: atrial flutter Axis: right axis deviation Intervals: qtc 462 QRS: RBBB, LPFB ST changes: no st elevation Impression: abnormal ekg  RADIOLOGY  I independently interpreted and visualized the CT head. My interpretation: subarachnoid bleed Radiology interpretation:  IMPRESSION:  1. 2 mm LEFT anterior parafalcine subdural hematoma and 3 mm  anterior LEFT temporal subdural hematoma. LEFT temporal subarachnoid  hemorrhage and possibly RIGHT sylvian fissure subarachnoid  hemorrhage. No evidence of mass effect or midline shift.  2. Atrophy and chronic small-vessel white matter ischemic changes.  3. No evidence of acute injury to the cervical spine. Multilevel  degenerative changes within the cervical spine contributing to  multilevel central spinal and bony foraminal narrowing.   I, Phineas SemenGraydon Eulogio Requena, personally discussed these images (CT head) and results by phone with the on-call radiologist and used this discussion as part of my medical decision making.    I independently interpreted and visualized the CT head. My interpretation: new right ICH Radiology interpretation: IMPRESSION: Stable left temporal subdural and subarachnoid hemorrhages. Stable left  parafalcine subdural hematoma.  New area of hemorrhage in the anterior right temporal lobe, 1.2 cm.   I independently interpreted and visualized the right ribs. My interpretation: old rib fractures Radiology interpretation:  IMPRESSION:  1. No acute fracture.  2. Chronic bilateral rib fractures.     PROCEDURES:  Critical Care performed: Yes  CRITICAL CARE Performed by: Phineas SemenGraydon Pa Tennant   Total critical care time: 35 minutes  Critical care time was exclusive of separately billable procedures and treating other patients.  Critical care was necessary to treat or prevent imminent or life-threatening deterioration.  Critical care was time spent personally by me on the following activities: development of treatment plan with patient and/or surrogate as well as nursing, discussions with consultants, evaluation of patient's response to treatment, examination of patient, obtaining history from patient or surrogate, ordering and performing treatments and interventions, ordering and review of laboratory studies, ordering and review of radiographic studies, pulse oximetry and re-evaluation of patient's condition.   Procedures    MEDICATIONS ORDERED IN ED: Medications  sodium chloride 0.9 % bolus 1,000 mL (has no administration in time range)     IMPRESSION / MDM / ASSESSMENT AND PLAN / ED COURSE  I reviewed the triage vital signs and the nursing notes.                              Differential diagnosis includes, but is not limited to, intracranial process, fracture, rib fracture, pneumothorax, UTI, anemia.  Patient's presentation is most consistent with acute presentation with potential threat to life or bodily function.   The patient is on the cardiac monitor to evaluate for evidence of arrhythmia and/or significant heart rate changes.  Patient presented to the emergency department today after suffering a fall.  Somewhat unclear etiology of the patient's fall.  She cannot give  a great history.  She is however complaining of headache and right-sided chest pain.  She does have obvious traumatic injury to the right side of her head.  CT scan was performed which was concerning for both subdural as well as subarachnoid hemorrhage.  Discussed with Dr. Myer HaffYarbrough with neurosurgery who recommended a 6-hour repeat, did not recommend epileptic prophylaxis.  Blood work here without obvious etiology of the fall.  Patient without anemia.  No concerning findings for significant dehydration.  While awaiting repeat head CT patient did complain that she felt like she needed to urinate but was unable.  Bladder scan showed roughly 600 cc in the bladder so Foley catheter was placed.  Urine however without findings  concerning for infection.  Unclear etiology of the retention.  Repeat CT shows stable nosebleeds however new area of hemorrhage in the right temporal lobe.  At this time Dr. Myer Haff does not think the urinary retention would be secondary to CT findings.  Discussed with Dr. Allena Katz with the hospitalist service who will plan on admission.      FINAL CLINICAL IMPRESSION(S) / ED DIAGNOSES   Final diagnoses:  Fall, initial encounter  Intracranial bleed      Note:  This document was prepared using Dragon voice recognition software and may include unintentional dictation errors.    Phineas Semen, MD 03/23/23 [phone]

## 2023-03-23 NOTE — Consult Note (Signed)
Imaging and clinic course discussed with Dr. Derrill Kay.  Nonoperative traumatic SAH noted.  AT baseline.  - repeat HCT in 6 hours - would not recommend AED due to risk for polypharmacy and behavior alteration

## 2023-03-24 ENCOUNTER — Telehealth (HOSPITAL_COMMUNITY): Payer: Self-pay | Admitting: Pharmacy Technician

## 2023-03-24 ENCOUNTER — Telehealth: Payer: Self-pay

## 2023-03-24 ENCOUNTER — Inpatient Hospital Stay: Payer: PPO

## 2023-03-24 ENCOUNTER — Other Ambulatory Visit (HOSPITAL_COMMUNITY): Payer: Self-pay

## 2023-03-24 ENCOUNTER — Ambulatory Visit: Payer: PPO

## 2023-03-24 DIAGNOSIS — I629 Nontraumatic intracranial hemorrhage, unspecified: Secondary | ICD-10-CM

## 2023-03-24 DIAGNOSIS — M4856XA Collapsed vertebra, not elsewhere classified, lumbar region, initial encounter for fracture: Secondary | ICD-10-CM | POA: Diagnosis present

## 2023-03-24 DIAGNOSIS — G629 Polyneuropathy, unspecified: Secondary | ICD-10-CM | POA: Diagnosis present

## 2023-03-24 DIAGNOSIS — N1831 Chronic kidney disease, stage 3a: Secondary | ICD-10-CM | POA: Diagnosis present

## 2023-03-24 DIAGNOSIS — S7011XA Contusion of right thigh, initial encounter: Secondary | ICD-10-CM | POA: Diagnosis present

## 2023-03-24 DIAGNOSIS — Z681 Body mass index (BMI) 19 or less, adult: Secondary | ICD-10-CM | POA: Diagnosis not present

## 2023-03-24 DIAGNOSIS — Z789 Other specified health status: Secondary | ICD-10-CM

## 2023-03-24 DIAGNOSIS — R55 Syncope and collapse: Secondary | ICD-10-CM | POA: Diagnosis not present

## 2023-03-24 DIAGNOSIS — F32A Depression, unspecified: Secondary | ICD-10-CM | POA: Diagnosis present

## 2023-03-24 DIAGNOSIS — I609 Nontraumatic subarachnoid hemorrhage, unspecified: Secondary | ICD-10-CM

## 2023-03-24 DIAGNOSIS — W01198A Fall on same level from slipping, tripping and stumbling with subsequent striking against other object, initial encounter: Secondary | ICD-10-CM | POA: Diagnosis present

## 2023-03-24 DIAGNOSIS — R338 Other retention of urine: Secondary | ICD-10-CM

## 2023-03-24 DIAGNOSIS — E876 Hypokalemia: Secondary | ICD-10-CM | POA: Diagnosis present

## 2023-03-24 DIAGNOSIS — L03115 Cellulitis of right lower limb: Secondary | ICD-10-CM | POA: Diagnosis present

## 2023-03-24 DIAGNOSIS — R569 Unspecified convulsions: Secondary | ICD-10-CM | POA: Diagnosis not present

## 2023-03-24 DIAGNOSIS — Z66 Do not resuscitate: Secondary | ICD-10-CM | POA: Diagnosis not present

## 2023-03-24 DIAGNOSIS — S065XAA Traumatic subdural hemorrhage with loss of consciousness status unknown, initial encounter: Secondary | ICD-10-CM | POA: Diagnosis present

## 2023-03-24 DIAGNOSIS — N179 Acute kidney failure, unspecified: Secondary | ICD-10-CM | POA: Diagnosis present

## 2023-03-24 DIAGNOSIS — Z7189 Other specified counseling: Secondary | ICD-10-CM | POA: Diagnosis not present

## 2023-03-24 DIAGNOSIS — E43 Unspecified severe protein-calorie malnutrition: Secondary | ICD-10-CM | POA: Diagnosis present

## 2023-03-24 DIAGNOSIS — S066XAA Traumatic subarachnoid hemorrhage with loss of consciousness status unknown, initial encounter: Secondary | ICD-10-CM | POA: Diagnosis present

## 2023-03-24 DIAGNOSIS — M797 Fibromyalgia: Secondary | ICD-10-CM | POA: Diagnosis present

## 2023-03-24 DIAGNOSIS — J449 Chronic obstructive pulmonary disease, unspecified: Secondary | ICD-10-CM | POA: Diagnosis present

## 2023-03-24 DIAGNOSIS — I480 Paroxysmal atrial fibrillation: Secondary | ICD-10-CM | POA: Diagnosis not present

## 2023-03-24 DIAGNOSIS — E785 Hyperlipidemia, unspecified: Secondary | ICD-10-CM | POA: Diagnosis present

## 2023-03-24 DIAGNOSIS — R001 Bradycardia, unspecified: Secondary | ICD-10-CM | POA: Diagnosis not present

## 2023-03-24 DIAGNOSIS — S01111A Laceration without foreign body of right eyelid and periocular area, initial encounter: Secondary | ICD-10-CM | POA: Diagnosis present

## 2023-03-24 DIAGNOSIS — M329 Systemic lupus erythematosus, unspecified: Secondary | ICD-10-CM | POA: Diagnosis present

## 2023-03-24 DIAGNOSIS — I951 Orthostatic hypotension: Secondary | ICD-10-CM | POA: Diagnosis not present

## 2023-03-24 DIAGNOSIS — I129 Hypertensive chronic kidney disease with stage 1 through stage 4 chronic kidney disease, or unspecified chronic kidney disease: Secondary | ICD-10-CM | POA: Diagnosis present

## 2023-03-24 DIAGNOSIS — Z515 Encounter for palliative care: Secondary | ICD-10-CM | POA: Diagnosis not present

## 2023-03-24 DIAGNOSIS — K760 Fatty (change of) liver, not elsewhere classified: Secondary | ICD-10-CM | POA: Diagnosis present

## 2023-03-24 LAB — COMPREHENSIVE METABOLIC PANEL
ALT: 25 U/L (ref 0–44)
AST: 37 U/L (ref 15–41)
Albumin: 2.5 g/dL — ABNORMAL LOW (ref 3.5–5.0)
Alkaline Phosphatase: 353 U/L — ABNORMAL HIGH (ref 38–126)
Anion gap: 10 (ref 5–15)
BUN: 18 mg/dL (ref 8–23)
CO2: 21 mmol/L — ABNORMAL LOW (ref 22–32)
Calcium: 7.7 mg/dL — ABNORMAL LOW (ref 8.9–10.3)
Chloride: 106 mmol/L (ref 98–111)
Creatinine, Ser: 1.04 mg/dL — ABNORMAL HIGH (ref 0.44–1.00)
GFR, Estimated: 56 mL/min — ABNORMAL LOW (ref >60)
Glucose, Bld: 139 mg/dL — ABNORMAL HIGH (ref 70–99)
Potassium: 3 mmol/L — ABNORMAL LOW (ref 3.5–5.1)
Sodium: 137 mmol/L (ref 135–145)
Total Bilirubin: 1.5 mg/dL — ABNORMAL HIGH (ref 0.3–1.2)
Total Protein: 5.5 g/dL — ABNORMAL LOW (ref 6.5–8.1)

## 2023-03-24 LAB — CBC
HCT: 33.4 % — ABNORMAL LOW (ref 36.0–46.0)
Hemoglobin: 11 g/dL — ABNORMAL LOW (ref 12.0–15.0)
MCH: 30.8 pg (ref 26.0–34.0)
MCHC: 32.9 g/dL (ref 30.0–36.0)
MCV: 93.6 fL (ref 80.0–100.0)
Platelets: 207 10*3/uL (ref 150–400)
RBC: 3.57 MIL/uL — ABNORMAL LOW (ref 3.87–5.11)
RDW: 15.3 % (ref 11.5–15.5)
WBC: 7.4 10*3/uL (ref 4.0–10.5)
nRBC: 0 % (ref 0.0–0.2)

## 2023-03-24 LAB — MRSA NEXT GEN BY PCR, NASAL: MRSA by PCR Next Gen: NOT DETECTED

## 2023-03-24 LAB — GLUCOSE, CAPILLARY
Glucose-Capillary: 67 mg/dL — ABNORMAL LOW (ref 70–99)
Glucose-Capillary: 66 mg/dL — ABNORMAL LOW (ref 70–99)
Glucose-Capillary: 170 mg/dL — ABNORMAL HIGH (ref 70–99)

## 2023-03-24 LAB — MAGNESIUM: Magnesium: 1.6 mg/dL — ABNORMAL LOW (ref 1.7–2.4)

## 2023-03-24 MED ORDER — SODIUM CHLORIDE 0.9 % IV SOLN: INTRAVENOUS | Status: DC

## 2023-03-24 MED ORDER — SODIUM CHLORIDE 0.9% FLUSH
3.0000 mL | Freq: Two times a day (BID) | INTRAVENOUS | Status: DC
  Administered 2023-03-24 – 2023-03-29 (×12): 3 mL via INTRAVENOUS

## 2023-03-24 MED ORDER — ACETAMINOPHEN 325 MG PO TABS
650.0000 mg | ORAL_TABLET | Freq: Four times a day (QID) | ORAL | Status: DC | PRN
  Administered 2023-03-24 – 2023-03-29 (×6): 650 mg via ORAL
  Filled 2023-03-24 (×6): qty 2

## 2023-03-24 MED ORDER — AMIODARONE HCL 200 MG PO TABS
100.0000 mg | ORAL_TABLET | Freq: Two times a day (BID) | ORAL | Status: DC
  Administered 2023-03-24 (×2): 100 mg via ORAL
  Filled 2023-03-24 (×3): qty 1

## 2023-03-24 MED ORDER — POTASSIUM CHLORIDE CRYS ER 20 MEQ PO TBCR
40.0000 meq | EXTENDED_RELEASE_TABLET | Freq: Once | ORAL | Status: AC
  Administered 2023-03-24: 40 meq via ORAL
  Filled 2023-03-24: qty 2

## 2023-03-24 MED ORDER — METOPROLOL TARTRATE 25 MG PO TABS
25.0000 mg | ORAL_TABLET | Freq: Two times a day (BID) | ORAL | Status: DC
  Administered 2023-03-24: 25 mg via ORAL
  Filled 2023-03-24 (×2): qty 1

## 2023-03-24 MED ORDER — HYDROCODONE-ACETAMINOPHEN 5-325 MG PO TABS
1.0000 | ORAL_TABLET | Freq: Three times a day (TID) | ORAL | Status: DC | PRN
  Administered 2023-03-24 – 2023-03-28 (×9): 1 via ORAL
  Filled 2023-03-24 (×12): qty 1

## 2023-03-24 MED ORDER — DEXTROSE 50 % IV SOLN
INTRAVENOUS | Status: AC
  Administered 2023-03-24: 50 mL
  Filled 2023-03-24: qty 50

## 2023-03-24 MED ORDER — LABETALOL HCL 5 MG/ML IV SOLN
10.0000 mg | INTRAVENOUS | Status: DC | PRN
  Administered 2023-03-24: 10 mg via INTRAVENOUS
  Filled 2023-03-24: qty 4

## 2023-03-24 MED ORDER — LACTATED RINGERS IV SOLN: INTRAVENOUS | Status: DC

## 2023-03-24 MED ORDER — FOLIC ACID 1 MG PO TABS
1.0000 mg | ORAL_TABLET | Freq: Every day | ORAL | Status: DC
  Administered 2023-03-25 – 2023-03-29 (×5): 1 mg via ORAL
  Filled 2023-03-24 (×5): qty 1

## 2023-03-24 MED ORDER — ACETAMINOPHEN 650 MG RE SUPP: 650.0000 mg | Freq: Four times a day (QID) | RECTAL | Status: DC | PRN

## 2023-03-24 MED ORDER — ENSURE ENLIVE PO LIQD
237.0000 mL | Freq: Two times a day (BID) | ORAL | Status: DC
  Administered 2023-03-24 – 2023-03-29 (×10): 237 mL via ORAL

## 2023-03-24 MED ORDER — THIAMINE HCL 100 MG/ML IJ SOLN
100.0000 mg | INTRAMUSCULAR | Status: DC
  Administered 2023-03-24: 100 mg via INTRAVENOUS
  Filled 2023-03-24: qty 2

## 2023-03-24 MED ORDER — THIAMINE HCL 100 MG/ML IJ SOLN
500.0000 mg | Freq: Three times a day (TID) | INTRAVENOUS | Status: AC
  Administered 2023-03-24 – 2023-03-26 (×6): 500 mg via INTRAVENOUS
  Filled 2023-03-24 (×6): qty 5

## 2023-03-24 MED ORDER — NYSTATIN 100000 UNIT/ML MT SUSP
5.0000 mL | Freq: Four times a day (QID) | OROMUCOSAL | Status: DC
  Administered 2023-03-24 – 2023-03-25 (×4): 500000 [IU] via ORAL
  Filled 2023-03-24 (×4): qty 5

## 2023-03-24 MED ORDER — DIPHENHYDRAMINE HCL 25 MG PO CAPS
25.0000 mg | ORAL_CAPSULE | Freq: Three times a day (TID) | ORAL | Status: DC | PRN
  Administered 2023-03-24 – 2023-03-25 (×2): 25 mg via ORAL
  Filled 2023-03-24 (×2): qty 1

## 2023-03-24 MED ORDER — ENOXAPARIN SODIUM 30 MG/0.3ML IJ SOSY
30.0000 mg | PREFILLED_SYRINGE | INTRAMUSCULAR | Status: DC
  Administered 2023-03-24 – 2023-03-25 (×2): 30 mg via SUBCUTANEOUS
  Filled 2023-03-24 (×2): qty 0.3

## 2023-03-24 MED ORDER — DEXTROSE IN LACTATED RINGERS 5 % IV SOLN: INTRAVENOUS | Status: DC

## 2023-03-24 MED ORDER — VITAMIN C 500 MG PO TABS
500.0000 mg | ORAL_TABLET | Freq: Two times a day (BID) | ORAL | Status: DC
  Administered 2023-03-24 – 2023-03-29 (×10): 500 mg via ORAL
  Filled 2023-03-24 (×10): qty 1

## 2023-03-24 MED ORDER — ADULT MULTIVITAMIN W/MINERALS CH
1.0000 | ORAL_TABLET | Freq: Every day | ORAL | Status: DC
  Administered 2023-03-25 – 2023-03-29 (×5): 1 via ORAL
  Filled 2023-03-24 (×5): qty 1

## 2023-03-24 MED ORDER — MAGNESIUM SULFATE 4 GM/100ML IV SOLN
4.0000 g | Freq: Once | INTRAVENOUS | Status: AC
  Administered 2023-03-24: 4 g via INTRAVENOUS
  Filled 2023-03-24: qty 100

## 2023-03-24 MED ORDER — CHLORHEXIDINE GLUCONATE CLOTH 2 % EX PADS
6.0000 | MEDICATED_PAD | Freq: Every day | CUTANEOUS | Status: DC
  Administered 2023-03-24 – 2023-03-29 (×5): 6 via TOPICAL

## 2023-03-24 MED ORDER — METOPROLOL TARTRATE 5 MG/5ML IV SOLN
2.5000 mg | Freq: Four times a day (QID) | INTRAVENOUS | Status: DC | PRN
  Administered 2023-03-24: 2.5 mg via INTRAVENOUS
  Filled 2023-03-24: qty 5

## 2023-03-24 NOTE — TOC CM/SW Note (Signed)
Went by room to complete readmission prevention screen and talk to patient about SNF recommendations. Patient was having a test done. Will try again later.  Charlynn Court, CSW [phone]

## 2023-03-24 NOTE — Assessment & Plan Note (Addendum)
Replaced. °

## 2023-03-24 NOTE — Progress Notes (Signed)
Initial Nutrition Assessment  DOCUMENTATION CODES:   Severe malnutrition in context of chronic illness  INTERVENTION:   Ensure Enlive po BID, each supplement provides 350 kcal and 20 grams of protein.  Magic cup TID with meals, each supplement provides 290 kcal and 9 grams of protein  MVI, thiamine and folic acid po daily   Vitamin C 500mg  po BID   Pt at high refeed risk; recommend monitor potassium, magnesium and phosphorus labs daily until stable  Daily weights   Check vitamin D lab  NUTRITION DIAGNOSIS:   Severe Malnutrition related to chronic illness (COPD, etoh abuse, CHF) as evidenced by severe fat depletion, 16 percent weight loss in 7 months, severe muscle depletion.  GOAL:   Patient will meet greater than or equal to 90% of their needs  MONITOR:   PO intake, Supplement acceptance, Labs, Weight trends, I & O's, Skin  REASON FOR ASSESSMENT:   Malnutrition Screening Tool    ASSESSMENT:   77 y/o female with h/o PAF, CKD III, etoh abuse, anxiety, depression, chronic pain, HTN, CHF, HLD, TIA, breast cancer, GERD, SLE, COPD, chronic UTIs and chronic falls who is admitted with traumatic brain injury and intracranial hemorrhage after fall.  Met with pt in room today. Pt is sitting up in chair at time of RD visit. Pt reports that she is feeling pretty rough today. Pt reports fair appetite and oral intake at baseline. Pt reports eating a pancake, a few bites of omelet and a piece of bacon for breakfast. Pt reports that she does like strawberry Ensure but reports that she does not drink this at home. Pt reports weight loss pta. Per chart, pt is down ~20lbs(16%) since September; this is significant weight loss. RD discussed with pt the importance of adequate nutrition needed to preserve lean muscle. RD will add supplements and vitamins to help pt meet her estimated needs. Pt is at high refeed risk. Pt with ecchymosis; will add vitamin C. Pt with h/o B12 and vitamin D  deficiency; will check vitamin D as pt with frequents falls. Pt is receiving high dose IV thiamine as she is at high risk for Wernicke's.   Medications reviewed and include: vitamin C, folic acid, MVI, NaCl @50ml /hr, thiamine   Labs reviewed: Na 137 wnl, K 3.0(L), creat 1.04(H), tbili 1.5(H) Mg 1.6(L)- 4/10 B12- 1369(H)- 2/20 Cbgs- 170, 66, 67 x 24 hrs   NUTRITION - FOCUSED PHYSICAL EXAM:  Flowsheet Row Most Recent Value  Orbital Region Moderate depletion  Upper Arm Region Moderate depletion  Thoracic and Lumbar Region Severe depletion  Buccal Region Moderate depletion  Temple Region Severe depletion  Clavicle Bone Region Severe depletion  Clavicle and Acromion Bone Region Severe depletion  Scapular Bone Region Moderate depletion  Dorsal Hand Moderate depletion  Patellar Region Moderate depletion  Anterior Thigh Region Moderate depletion  Posterior Calf Region Severe depletion  Edema (RD Assessment) Mild  Hair Reviewed  Eyes Reviewed  Mouth Reviewed  Skin Reviewed  Nails Reviewed   Diet Order:   Diet Order             Diet regular Room service appropriate? Yes; Fluid consistency: Thin  Diet effective now                  EDUCATION NEEDS:   Education needs have been addressed  Skin:  Skin Assessment: Reviewed RN Assessment (ecchymosis)  Last BM:  4/8  Height:   Ht Readings from Last 1 Encounters:  03/23/23 5\' 7"  (1.702  m)    Weight:   Wt Readings from Last 1 Encounters:  03/24/23 46 kg    Ideal Body Weight:  67 kg  BMI:  Body mass index is 15.88 kg/m.  Estimated Nutritional Needs:   Kcal:  1500-1700kcal/day  Protein:  75-85g/day  Fluid:  1.3-1.5L/day  Betsey Holiday MS, RD, LDN Please refer to [facility] for RD and/or RD on-call/weekend/after hours pager

## 2023-03-24 NOTE — Telephone Encounter (Signed)
Pharmacy Patient Advocate Encounter  Insurance verification completed.    The patient is insured through [employer] Part D   The patient is currently admitted and ran test claims for the following: Eliquis, Xarelto.  Copays and coinsurance results were relayed to Inpatient clinical team.  

## 2023-03-24 NOTE — Assessment & Plan Note (Addendum)
Completed high-dose thiamine will give oral thiamine now.

## 2023-03-24 NOTE — Plan of Care (Signed)
  Problem: Education: Goal: Knowledge of condition and prescribed therapy will improve Outcome: Not Progressing   Problem: Cardiac: Goal: Will achieve and/or maintain adequate cardiac output Outcome: Not Progressing   Problem: Physical Regulation: Goal: Complications related to the disease process, condition or treatment will be avoided or minimized Outcome: Not Progressing   Problem: Education: Goal: Knowledge of General Education information will improve Description: Including pain rating scale, medication(s)/side effects and non-pharmacologic comfort measures Outcome: Not Progressing   Problem: Health Behavior/Discharge Planning: Goal: Ability to manage health-related needs will improve Outcome: Not Progressing   Problem: Clinical Measurements: Goal: Ability to maintain clinical measurements within normal limits will improve Outcome: Not Progressing Goal: Will remain free from infection Outcome: Not Progressing Goal: Diagnostic test results will improve Outcome: Not Progressing Goal: Respiratory complications will improve Outcome: Not Progressing Goal: Cardiovascular complication will be avoided Outcome: Not Progressing   Problem: Activity: Goal: Risk for activity intolerance will decrease Outcome: Not Progressing   Problem: Nutrition: Goal: Adequate nutrition will be maintained Outcome: Not Progressing   Problem: Coping: Goal: Level of anxiety will decrease Outcome: Not Progressing   Problem: Elimination: Goal: Will not experience complications related to bowel motility Outcome: Not Progressing Goal: Will not experience complications related to urinary retention Outcome: Not Progressing   Problem: Pain Managment: Goal: General experience of comfort will improve Outcome: Not Progressing   Problem: Safety: Goal: Ability to remain free from injury will improve Outcome: Not Progressing   Problem: Skin Integrity: Goal: Risk for impaired skin integrity will  decrease Outcome: Not Progressing   Problem: Education: Goal: Knowledge of disease or condition will improve Outcome: Not Progressing Goal: Understanding of medication regimen will improve Outcome: Not Progressing Goal: Individualized Educational Video(s) Outcome: Not Progressing   Problem: Activity: Goal: Ability to tolerate increased activity will improve Outcome: Not Progressing   Problem: Cardiac: Goal: Ability to achieve and maintain adequate cardiopulmonary perfusion will improve Outcome: Not Progressing   Problem: Health Behavior/Discharge Planning: Goal: Ability to safely manage health-related needs after discharge will improve Outcome: Not Progressing

## 2023-03-24 NOTE — Progress Notes (Signed)
Eeg done 

## 2023-03-24 NOTE — Hospital Course (Addendum)
77 year old female that lives alone.  Brought in with altered mental status and frequent falls.  Past medical history of TIA, A-fib, hypertension, GERD.  Patient complains of right-sided pain.  She has been off of blood thinners for a while.  Patient found to have subdural hematoma and subarachnoid hemorrhage on CT scan and MRI.  Bridget Hartshorn concerned that she may have had a seizure.  He has witnessed 1 fall in the past where she was staring blank and then started shaking and fell backwards.  EEG negative.  4/12.  Patient still complaining of a lot of pain.  Seen by neurology and gabapentin started at night.  Physical therapy recommending rehab.  Patient having some abdominal discomfort.  Told nursing staff about back discomfort. 4/13.  Patient complains of pains all over.  Pointed out redness on her right foot, so I started antibiotics for cellulitis.

## 2023-03-24 NOTE — Assessment & Plan Note (Addendum)
CKD stage IIIa.  Today's creatinine 1.18 with a GFR 48.

## 2023-03-24 NOTE — Telephone Encounter (Signed)
Order placed for CT head.  Dr Myer Haff saw her as a consult on 03/23/23, so this will be a return appt. CT and appointment with Dr Myer Haff can be around 04/14/23-04/21/23.

## 2023-03-24 NOTE — Assessment & Plan Note (Addendum)
Voiding trial

## 2023-03-24 NOTE — Assessment & Plan Note (Addendum)
Appreciate neurosurgery consultation.  Okay to work with physical therapy and Occupational Therapy.  No blood thinners for 2 weeks.  Can go back on Eliquis 5 mg twice a day in 2 weeks.  Repeat CT scan showed reduction of left-sided hemorrhage and stable right-sided hemorrhage.  Follow-up as outpatient for repeat CT scan in 1 month as per neurosurgery.

## 2023-03-24 NOTE — Progress Notes (Signed)
Progress Note   Patient: Donna Evans FYT:[id number] DOB: 1946-02-13 DOA: 03/23/2023     0 DOS: the patient was seen and examined on 03/24/2023   Brief hospital course: 77 year old female that lives alone.  Brought in with altered mental status and frequent falls.  Past medical history of TIA, A-fib, hypertension, GERD.  Patient complains of right-sided pain.  She has been off of blood thinners for a while.  Patient found to have subdural hematoma and subarachnoid hemorrhage on CT scan and MRI.  Bridget Hartshorn concerned that she may have had a seizure.  He has witnessed 1 fall in the past where she was staring blank and then started shaking and fell backwards.  EEG ordered.  Neurology consulted.    Assessment and Plan: * Subdural hematoma Appreciate neurosurgery consultation.  Okay to work with physical therapy and Occupational Therapy.  No blood thinners for 2 weeks.  Follow-up as outpatient for repeat CT scan in 1 month as per neurosurgery.  Subarachnoid hemorrhage See above plan.    Syncope Orthostatic hypotension.  Will give IV fluids.  Will get EEG just in case seizure.  Case discussed with neurology to evaluate.   Acute urinary retention Try to remove Foley catheter tomorrow morning.  Hypomagnesemia Replaced  Protein-calorie malnutrition, severe Continue supplements  Alcohol use Will give high-dose thiamine  Hypokalemia Replace potassium  Orthostatic hypotension Hold metoprolol, will check a.m. cortisol  CKD (chronic kidney disease), stage III CKD stage IIIa.  Creatinine 1.04 with a GFR 56.  Paroxysmal atrial fibrillation with RVR Discontinue metoprolol.  Okay to continue amiodarone.  Unable to do anticoagulation with subdural hematoma and subarachnoid hemorrhage.        Subjective: Patient complains of some right-sided pain that goes down her body and some headache.  Found to have 3 areas of bleeding on the brain.  Has had quite a few falls at home.  Bridget Hartshorn who  helps take care of her thinks that she may have had a seizure on 1 occasion where she was standing up and her eyes went blank and then started shaking and fell backwards.  Physical Exam: Vitals:   03/24/23 0600 03/24/23 0701 03/24/23 0800 03/24/23 1000  BP: 118/71  137/65 (!) 127/56  Pulse:      Resp: 14  16 18   Temp:  97.6 F (36.4 C)    TempSrc:  Oral    SpO2:      Weight:      Height:       Physical Exam HENT:     Head: Normocephalic.     Mouth/Throat:     Pharynx: No oropharyngeal exudate.  Eyes:     General: Lids are normal.     Conjunctiva/sclera: Conjunctivae normal.  Cardiovascular:     Rate and Rhythm: Normal rate and regular rhythm.     Heart sounds: Normal heart sounds, S1 normal and S2 normal.  Pulmonary:     Breath sounds: No decreased breath sounds, wheezing, rhonchi or rales.  Abdominal:     Palpations: Abdomen is soft.     Tenderness: There is no abdominal tenderness.  Musculoskeletal:     Right lower leg: No swelling.     Left lower leg: No swelling.  Skin:    General: Skin is warm.     Findings: No rash.  Neurological:     Mental Status: She is alert.     Comments: Able to move her arms and legs to command.     Data Reviewed: Potassium  3.0, creatinine 1.04, alkaline phosphatase 353, total bilirubin 1.5, GFR 56, white blood cell count 7.4, hemoglobin 11.0, platelet count 207  Family Communication: Spoke with Jamey in the hospital  Disposition: Status is: Inpatient Remains inpatient appropriate because: Monitoring neurological status.  Obtaining an EEG.  Planned Discharge Destination: Rehab    Time spent: 28 minutes Case discussed with neurosurgery and neurology.  Author: Alford Highland, MD 03/24/2023 1:08 PM  For on call review [email or web address].

## 2023-03-24 NOTE — Assessment & Plan Note (Addendum)
Orthostatic hypotension.  Patient given fluids yesterday.  Neurology starting gabapentin at night.

## 2023-03-24 NOTE — TOC Benefit Eligibility Note (Signed)
Patient Advocate Encounter  Insurance verification completed.    The patient is currently admitted and upon discharge could be taking Eliquis 5 mg.  The current 30 day co-pay is $47.00.   The patient is currently admitted and upon discharge could be taking Xarelto 20 mg.  The current 30 day co-pay is $47.00.   The patient is insured through [employer] Medicare Part D   This test claim was processed through [facility]- copay amounts may vary at other pharmacies due to pharmacy/plan contracts, or as the patient moves through the different stages of their insurance plan.  Exie Chrismer, CPHT Pharmacy Patient Advocate Specialist [facility] Patient Advocate Team Direct Number: [phone]  Fax: [phone]       

## 2023-03-24 NOTE — Evaluation (Addendum)
Occupational Therapy Evaluation Patient Details Name: DEAVION OLIVEROS MRN: [id number] DOB: 1946/05/11 Today's Date: 03/24/2023   History of Present Illness KAYTE DEMASTUS is an 77 y.o. female seen in the emergency room today brought by EMS for a fall.  She has a small amount of intracranial hemorrhage, includes subdural and subarachnoid components.  Patient also reported dysuria she has a history of UTIs patient also has past medical history of TIA A-fib, hypertension, GERD.  Patient reports right-sided rib pain that is worse with taking a deep breath.   Clinical Impression   Ms Shimazu was seen for OT evaluation this date. Prior to hospital admission, pt was MOD I using 4WW, endorses 1 recent fall. Pt lives alone. Pt presents to acute OT demonstrating impaired ADL performance and functional mobility 2/2 decreased activity tolerance and functional strength/ROM/balance deficits. Pt currently requires MIN A for LB access seated EOB. CGA + RW for toilet t/f, poor dynamic standing balance without UE support. Pt would benefit from skilled OT to address noted impairments and functional limitations (see below for any additional details). Upon hospital discharge, recommend follow up therapy and in creased assistance at home as pt lives alone and remaons high fall risk.  Orthostatic Vitals  Lying: BP 151/72, HR 59 Sitting: BP 150/69, HR 60 Standing: BP 138/89, HR 61  3 min Standing: BP 117/77, HR 62 +dizzy/weakness    Recommendations for follow up therapy are one component of a multi-disciplinary discharge planning process, led by the attending physician.  Recommendations may be updated based on patient status, additional functional criteria and insurance authorization.   Assistance Recommended at Discharge Frequent or constant Supervision/Assistance  Patient can return home with the following A little help with walking and/or transfers;A little help with bathing/dressing/bathroom;Help with stairs or ramp  for entrance    Functional Status Assessment  Patient has had a recent decline in their functional status and demonstrates the ability to make significant improvements in function in a reasonable and predictable amount of time.  Equipment Recommendations  BSC/3in1    Recommendations for Other Services       Precautions / Restrictions Precautions Precautions: Fall Precaution Comments: +orthostatics Restrictions Weight Bearing Restrictions: No      Mobility Bed Mobility Overal bed mobility: Needs Assistance Bed Mobility: Supine to Sit     Supine to sit: Min guard          Transfers Overall transfer level: Needs assistance Equipment used: Rolling walker (2 wheels) Transfers: Sit to/from Stand Sit to Stand: Min guard                  Balance Overall balance assessment: Needs assistance Sitting-balance support: Feet supported, No upper extremity supported Sitting balance-Leahy Scale: Good     Standing balance support: During functional activity, No upper extremity supported Standing balance-Leahy Scale: Poor                             ADL either performed or assessed with clinical judgement   ADL Overall ADL's : Needs assistance/impaired                                       General ADL Comments: MIN A for LB access seated EOB. CGA + RW for toilet t/f, poor dynamic standing balance without UE support      Pertinent Vitals/Pain Pain Assessment  Pain Assessment: Faces Faces Pain Scale: Hurts little more Pain Location: R side of her head and cervical spine Pain Descriptors / Indicators: Aching, Discomfort Pain Intervention(s): Repositioned     Hand Dominance     Extremity/Trunk Assessment Upper Extremity Assessment Upper Extremity Assessment: Overall WFL for tasks assessed   Lower Extremity Assessment Lower Extremity Assessment: Generalized weakness   Cervical / Trunk Assessment Cervical / Trunk Assessment:  Normal   Communication Communication Communication: No difficulties   Cognition Arousal/Alertness: Awake/alert Behavior During Therapy: WFL for tasks assessed/performed Overall Cognitive Status: Within Functional Limits for tasks assessed                                                  Home Living Family/patient expects to be discharged to:: Private residence Living Arrangements: Alone Available Help at Discharge: Available PRN/intermittently;Friend(s);Home health Type of Home: House Home Access: Stairs to enter Entrance Stairs-Number of Steps: 1 Entrance Stairs-Rails: None Home Layout: One level     Bathroom Shower/Tub: Producer, television/film/video: Handicapped height     Home Equipment: Shower seat;Grab bars - toilet;Rollator (4 wheels);Cane - single Librarian, academic (2 wheels)          Prior Functioning/Environment Prior Level of Function : Needs assist;History of Falls (last six months)             Mobility Comments: endorses 1 fall in last 3 months          OT Problem List: Decreased activity tolerance;Decreased range of motion;Decreased strength      OT Treatment/Interventions: Self-care/ADL training;Therapeutic exercise;Energy conservation;DME and/or AE instruction;Therapeutic activities;Patient/family education;Balance training    OT Goals(Current goals can be found in the care plan section) Acute Rehab OT Goals Patient Stated Goal: to go home OT Goal Formulation: With patient Time For Goal Achievement: 04/07/23 Potential to Achieve Goals: Good ADL Goals Pt Will Perform Grooming: with modified independence;standing Pt Will Perform Lower Body Dressing: with modified independence;sit to/from stand Pt Will Transfer to Toilet: with modified independence;ambulating;regular height toilet  OT Frequency: Min 2X/week    Co-evaluation              AM-PAC OT "6 Clicks" Daily Activity     Outcome Measure Help from another  person eating meals?: None Help from another person taking care of personal grooming?: A Little Help from another person toileting, which includes using toliet, bedpan, or urinal?: A Little Help from another person bathing (including washing, rinsing, drying)?: A Little Help from another person to put on and taking off regular upper body clothing?: A Little Help from another person to put on and taking off regular lower body clothing?: A Little 6 Click Score: 19   End of Session Nurse Communication: Mobility status  Activity Tolerance: Patient tolerated treatment well Patient left: in chair;with call bell/phone within reach  OT Visit Diagnosis: Other abnormalities of gait and mobility (R26.89);Muscle weakness (generalized) (M62.81)                Time: 1740-8144 OT Time Calculation (min): 30 min Charges:  OT General Charges $OT Visit: 1 Visit OT Evaluation $OT Eval Moderate Complexity: 1 Mod OT Treatments $Self Care/Home Management : 8-22 mins  Kathie Dike, M.S. OTR/L  03/24/23, 12:26 PM  ascom [phone]

## 2023-03-24 NOTE — Consult Note (Addendum)
Consult requested by:  Dr. Derrill KayGoodman  Consult requested for:  Intracranial hemorrhage  Primary Physician:  Sherron Mondayejan-Sie, S Ahmed, MD  History of Present Illness: 03/24/2023 Donna Evans is here today with a chief complaint of fall.  She reports discomfort around the area of bruising on the right side of her face and back of her head.  She is at her baseline neurologically.  She has no complaints this morning other than a mild to moderate headache.  She apparently suffered a mechanical fall.  She was admitted for observation due to declining functional status at home as well as potential exacerbation of her medical issues.  I have utilized the care everywhere function in epic to review the outside records available from external health systems.  Review of Systems:  A 10 point review of systems is negative, except for the pertinent positives and negatives detailed in the HPI.  Past Medical History: Past Medical History:  Diagnosis Date   Allergic state    Anginal pain    Prinzmetal's angina   Anxiety    Arthritis    osteoarthritis   Breast cancer 1990   right breast cancer   Chronic pain    Chronic pain    COPD (chronic obstructive pulmonary disease)    Depression    Edema    Fibromyalgia    Foot fracture    Bilateral   GERD (gastroesophageal reflux disease)    History of kidney stones    Hypertension    Leg fracture, right    Low back pain    Lumbosacral neuritis    Medulloadrenal hyperfunction    Migraine headache    Peripheral neuropathy    Shoulder fracture, right    Stroke    TIA   Systemic lupus erythematosus    Thyroid disease    TIA (transient ischemic attack) 11/28/2013   Transient global amnesia 2011    Past Surgical History: Past Surgical History:  Procedure Laterality Date   ABDOMINAL HYSTERECTOMY  1981   APPENDECTOMY  1957   AUGMENTATION MAMMAPLASTY Bilateral 1990   saline submuscular   BACK SURGERY  1989   BILATERAL TOTAL MASTECTOMY  WITH AXILLARY LYMPH NODE DISSECTION  1990   BREAST IMPLANT EXCHANGE Bilateral 05/11/2016   Procedure: REMOVAL AND REPLACEMENT OF BREAST IMPLANTS ;  Surgeon: Glenna FellowsBrinda Thimmappa, MD;  Location: [facility];  Service: Plastics;  Laterality: Bilateral;   CAPSULECTOMY Bilateral 05/11/2016   Procedure: CAPSULECTOMY CAPSULORRAPHY ;  Surgeon: Glenna FellowsBrinda Thimmappa, MD;  Location: [facility];  Service: Plastics;  Laterality: Bilateral;   CATARACT EXTRACTION W/PHACO Right 12/26/2018   Procedure: CATARACT EXTRACTION PHACO AND INTRAOCULAR LENS PLACEMENT (IOC) RIGHT;  Surgeon: Galen ManilaPorfilio, William, MD;  Location: [facility];  Service: Ophthalmology;  Laterality: Right;  US  00:46 CDE 8.50 Fluid pack lot # [id number]   CATARACT EXTRACTION W/PHACO Left 01/23/2019   Procedure: CATARACT EXTRACTION PHACO AND INTRAOCULAR LENS PLACEMENT (IOC) LEFT;  Surgeon: Galen ManilaPorfilio, William, MD;  Location: [facility];  Service: Ophthalmology;  Laterality: Left;  US  00:45 CDE  8.41 fluid pack lot # [id number]   CHOLECYSTECTOMY  1979   COLONOSCOPY WITH PROPOFOL N/A 01/25/2018   Procedure: COLONOSCOPY WITH PROPOFOL;  Surgeon: Scot JunElliott, Robert T, MD;  [facility];  Service: Endoscopy;  Laterality: N/A;   COPD     ESOPHAGOGASTRODUODENOSCOPY (EGD) WITH PROPOFOL N/A 03/07/2018   Procedure: ESOPHAGOGASTRODUODENOSCOPY (EGD) WITH PROPOFOL;  Surgeon: Scot JunElliott, Robert T, MD;  [facility];  Service: Endoscopy;  Laterality: N/A;   FRACTURE SURGERY     HARDWARE REMOVAL Left 06/18/2020   Procedure: HARDWARE REMOVAL;  Surgeon: Lyndle Herrlich, MD;  Location: [facility];  Service: Orthopedics;  Laterality: Left;   HUMERUS IM NAIL Left 12/31/2019   Procedure: INTRAMEDULLARY (IM) NAIL HUMERAL;  Surgeon: Lyndle Herrlich, MD;  Location: [facility];  Service: Orthopedics;  Laterality: Left;   LIPOSUCTION Right 05/11/2016   Procedure: LIPOSUCTION;  Surgeon: Glenna Fellows, MD;  Location: [facility];  Service:  Plastics;  Laterality: Right;   LIVER BIOPSY  2011   MASTECTOMY SUBCUTANEOUS Bilateral 1990   MASTOPEXY Bilateral 05/11/2016   Procedure: MASTOPEXY BILATERAL ;  Surgeon: Glenna Fellows, MD;  Location: [facility];  Service: Plastics;  Laterality: Bilateral;    Allergies: Allergies as of 03/23/2023 - Review Complete 03/23/2023  Allergen Reaction Noted   Ciprofloxacin Nausea And Vomiting and Hives 05/07/2014   Codeine Anaphylaxis, Hives, and Other (See Comments) 11/20/2013   Fluconazole Hives 05/07/2014   Latex Anaphylaxis and Rash 11/20/2013   Meperidine Anaphylaxis and Hives 11/20/2013   Morphine and related Hives, Anaphylaxis, and Other (See Comments) 01/15/2010   Doxycycline Nausea And Vomiting 12/27/2019   Flagyl [metronidazole] Nausea And Vomiting 04/29/2015   Influenza vaccines Swelling 02/04/2016   Tape Other (See Comments) 10/01/2016   Valtrex [valacyclovir hcl] Hives 03/06/2018   Buprenorphine  04/28/2015   Duloxetine Other (See Comments) 05/07/2014   Silicone Other (See Comments) 10/01/2016   Tramadol  10/06/2022   Amoxicillin-pot clavulanate Nausea And Vomiting 05/07/2014   Sulfamethoxazole-trimethoprim Nausea And Vomiting and Rash 07/23/2014   Valacyclovir Rash 04/29/2015    Medications: Current Meds  Medication Sig   acetaminophen (TYLENOL) 500 MG tablet Take 1,000 mg by mouth every 6 (six) hours as needed.   amiodarone (PACERONE) 200 MG tablet Take 1 tablet (200 mg total) by mouth 2 (two) times daily for 12 days, THEN 1 tablet (200 mg total) daily. (Patient taking differently: Take 1 tablet daily)   busPIRone (BUSPAR) 5 MG tablet Take 1 tablet (5 mg total) by mouth 2 (two) times daily.   calcium carbonate (TUMS - DOSED IN MG ELEMENTAL CALCIUM) 500 MG chewable tablet Chew 1 tablet by mouth 3 (three) times daily as needed for indigestion or heartburn.   cyclobenzaprine (FLEXERIL) 10 MG tablet Take 10 mg by mouth 3 (three) times daily.   feeding  supplement (ENSURE ENLIVE / ENSURE PLUS) LIQD Take 237 mLs by mouth 2 (two) times daily between meals.   folic acid (FOLVITE) 1 MG tablet Take 1 tablet (1 mg total) by mouth daily.   metoprolol tartrate (LOPRESSOR) 25 MG tablet Take 1 tablet (25 mg total) by mouth 2 (two) times daily.   midodrine (PROAMATINE) 2.5 MG tablet Take 1 tablet (2.5 mg total) by mouth 3 (three) times daily with meals. (Patient taking differently: Take 2.5 mg by mouth daily.)   rosuvastatin (CRESTOR) 10 MG tablet Take 10 mg by mouth at bedtime.   Suvorexant (BELSOMRA) 10 MG TABS Take 1 tablet (10 mg total) by mouth daily.   torsemide (DEMADEX) 20 MG tablet Take 20 mg by mouth daily as needed.   Vitamin D, Ergocalciferol, 50000 units CAPS Take 1 capsule by mouth once a week.    Social History: Social History   Tobacco Use   Smoking status: Former    Packs/day: 1.00    Years: 40.00    Additional pack years: 0.00    Total pack years: 40.00  Types: Cigarettes    Quit date: 08/28/2005    Years since quitting: 17.5   Smokeless tobacco: Never  Vaping Use   Vaping Use: Never used  Substance Use Topics   Alcohol use: Not Currently   Drug use: No    Family Medical History: Family History  Problem Relation Age of Onset   Atrial fibrillation Mother    Atrial fibrillation Sister    Cancer Sister    Diabetes Sister    Breast cancer Sister 44   Diabetes Father    Cancer Sister    Diabetes Sister    Atrial fibrillation Sister    Kidney disease Maternal Aunt     Physical Examination: Vitals:   03/24/23 0500 03/24/23 0600  BP: 126/63 118/71  Pulse:    Resp: 15 14  Temp:    SpO2:      General: Patient is well developed, well nourished, calm, collected, and in no apparent distress. Attention to examination is appropriate.  Neck:   Supple.  Full range of motion.  Respiratory: Patient is breathing without any difficulty.  She has bruising noted around her right eye and around her right  jawline.   NEUROLOGICAL:     Awake, alert, oriented to person, place, and time.  Speech is clear and fluent.  Cranial Nerves: Pupils equal round and reactive to light.  Facial tone is symmetric.  Facial sensation is symmetric. Shoulder shrug is symmetric. Tongue protrusion is midline.  There is no pronator drift.   Strength: Side Biceps Triceps Deltoid Interossei Grip Wrist Ext. Wrist Flex.  R 5 5 5 5 5 5 5   L 5 5 5 5 5 5 5    Side Iliopsoas Quads Hamstring PF DF EHL  R 5 5 5 5 5 5   L 5 5 5 5 5 5    Reflexes are 1+ and symmetric at the biceps, triceps, brachioradialis, patella and achilles.   Hoffman's is absent.   Bilateral upper and lower extremity sensation is intact to light touch.    No evidence of dysmetria noted.  Gait is untested.     Medical Decision Making  Imaging: MRI Brain 03/24/2023 IMPRESSION: 1. Acute subdural hematoma overlying the left cerebral convexity, measuring up to 3 mm in maximal thickness. No significant mass effect or midline shift. 2. Scattered posttraumatic subarachnoid hemorrhage involving the peripheral left temporal lobe and left cerebellum. Few small superimposed hemorrhagic contusions about the left greater than right temporal lobes as above. Overall, these findings are relatively similar as compared to prior CT from 03/23/2023. 3. Underlying age-related cerebral atrophy with moderate chronic small vessel ischemic disease. No other acute intracranial abnormality.     Electronically Signed   By: Rise Mu M.D.   On: 03/24/2023 02:26  I have personally reviewed the images and agree with the above interpretation.  Assessment and Plan: Donna Evans is a pleasant 77 y.o. female with minor traumatic brain injury after fall.  She has a small amount of intracranial hemorrhage.  This includes subdural and subarachnoid components.  Stable reviewed imaging.  She has current consult: Scale 15.  She is at her neurologic baseline.  I  would recommend that she hold her Eliquis for 2 weeks.  She is cleared to restart.  Will follow her up in clinic repeat CT scan in approximately 1 month I would recommend against antiepileptics given her multiple medical issues and potential amount of pharmacy.  She is cleared to begin working with therapy.  From my standpoint, it is  okay for her to start prophylactic medication for deep venous thrombosis.  If she has any change in mental status, consider CT scan without contrast and contact me or the neurosurgeon on-call.    I have communicated my recommendations to the requesting physician and coordinated care to facilitate these recommendations.     Belita Warsame K. Myer Haff MD, [facility] Neurosurgery

## 2023-03-24 NOTE — [place] FL2 (Signed)
[place] MEDICAID FL2 LEVEL OF CARE FORM     IDENTIFICATION  Patient Name: Donna Evans Birthdate: 07/14/1946 Sex: female Admission Date (Current Location): 03/23/2023  [place] and [place] Number:  Chiropodist and Address:  [facility], [address] [place], [place] [address]      Provider Number: [id number]  Attending Physician Name and Address:  Alford Highland, MD  Relative Name and Phone Number:       Current Level of Care: Hospital Recommended Level of Care: Skilled Nursing Facility Prior Approval Number:    Date Approved/Denied:   PASRR Number: [id number]  Discharge Plan: SNF    Current Diagnoses: Patient Active Problem List   Diagnosis Date Noted   Subdural hematoma 03/24/2023   Subarachnoid hemorrhage 03/24/2023   Alcohol use 03/24/2023   Protein-calorie malnutrition, severe 03/24/2023   Hypomagnesemia 03/24/2023   Acute urinary retention 03/24/2023   Goals of care, counseling/discussion 03/01/2023   Frequent falls 02/01/2023   Atrial fibrillation, chronic 02/01/2023   Hypokalemia 02/01/2023   Bleeding 02/01/2023   Fracture, finger 11/18/2022   Orthostatic hypotension 11/17/2022   Encephalopathy 11/10/2022   CKD (chronic kidney disease), stage III 11/02/2022   Metabolic acidosis 11/02/2022   Steatohepatitis, non-alcoholic 11/02/2022   Paroxysmal atrial fibrillation with RVR 10/14/2022   Delirium due to another medical condition 10/08/2022   Bilateral leg edema    Head injury    Ground-level fall 10/06/2022   Acute encephalopathy 10/06/2022   Lymphedema 10/06/2022   Elevated LFTs 10/06/2022   HLD (hyperlipidemia) 08/10/2022   Elevated alkaline phosphatase level 08/10/2022   Elevated troponin 04/28/2022   Stroke 04/28/2022   Fall 04/28/2022   Acute renal failure superimposed on stage 3a chronic kidney disease 04/28/2022   Chronic diastolic CHF (congestive heart failure) 04/28/2022   Venous insufficiency  03/31/2022   SOB (shortness of breath) on exertion 03/08/2022   Pedal edema 03/08/2022   Closed fracture of distal end of left radius 12/22/2021   Low back pain 12/17/2021   Fracture of pelvis 11/13/2021   Bilateral leg weakness 05/13/2021   Memory change 03/04/2021   Imbalance 03/04/2021   Falls frequently 03/04/2021   Dizziness 03/04/2021   Senile osteoporosis 10/18/2019   Long term current use of opiate analgesic 07/04/2017   Long term prescription opiate use 07/04/2017   Opiate use 07/04/2017   Bursitis of shoulder 07/04/2017   Closed fracture of upper end of humerus 07/04/2017   Knee pain 07/04/2017   Localized, primary osteoarthritis 07/04/2017   Osteoarthritis of knee 07/04/2017   Pain in limb 07/04/2017   Sprain of wrist 07/04/2017   Pain in joint involving ankle and foot 07/04/2017   Impingement syndrome of left shoulder region 06/29/2017   Trochanteric bursitis 06/29/2017   AKI (acute kidney injury) 10/01/2016   Syncope 09/30/2016   Migraine headache 08/10/2016   Controlled substance agreement signed 06/12/2016   Insomnia 06/12/2016   Hypertension, benign 02/04/2016   Easy bruisability 11/13/2015   Osteoporosis 11/13/2015   Fibromyalgia syndrome 11/13/2015   Generalized anxiety disorder 11/13/2015   Chronic pain syndrome 11/13/2015   Overactive detrusor 09/11/2015   Adrenal adenoma 09/02/2015   Chronic cystitis 09/02/2015   Incomplete bladder emptying 09/02/2015   Renal colic 09/02/2015   Gross hematuria 07/28/2015   History of reconstruction of both breasts 06/18/2015   S/P cardiac catheterization 06/18/2015   Acquired absence of both breasts and nipples 11/20/2014   Transient cerebral ischemia 11/28/2013   Hemiplegia of dominant side 11/20/2013  Orientation RESPIRATION BLADDER Height & Weight     Self, Situation, Time, Place  Normal Continent, Indwelling catheter Weight: 101 lb 6.6 oz (46 kg) Height:  5\' 7"  (170.2 cm)  BEHAVIORAL SYMPTOMS/MOOD  NEUROLOGICAL BOWEL NUTRITION STATUS   (None)  (None) Continent Diet (Regular)  AMBULATORY STATUS COMMUNICATION OF NEEDS Skin   Extensive Assist Verbally Other (Comment) (Erythema/redness.)                       Personal Care Assistance Level of Assistance  Bathing, Feeding, Dressing Bathing Assistance: Limited assistance Feeding assistance: Limited assistance Dressing Assistance: Limited assistance     Functional Limitations Info  Sight, Hearing, Speech Sight Info: Adequate Hearing Info: Adequate Speech Info: Adequate    SPECIAL CARE FACTORS FREQUENCY  PT (By licensed PT), OT (By licensed OT)     PT Frequency: 5 x week OT Frequency: 5 x week            Contractures Contractures Info: Not present    Additional Factors Info  Code Status, Allergies Code Status Info: Full code Allergies Info: Ciprofloxacin, Codeine, Fluconazole, Latex, Meperidine, Morphine And Related, Doxycycline, Flagyl (Metronidazole), Influenza Vaccines, Tape, Valtrex (Valacyclovir Hcl), Buprenorphine, Duloxetine, Silicone, Tramadol, Amoxicillin-pot Clavulanate, Sulfamethoxazole-trimethoprim, Valacyclovir           Current Medications (03/24/2023):  This is the current hospital active medication list Current Facility-Administered Medications  Medication Dose Route Frequency Provider Last Rate Last Admin   0.9 %  sodium chloride infusion   Intravenous Continuous Alford Highland, MD 50 mL/hr at 03/24/23 1146 New Bag at 03/24/23 1146   acetaminophen (TYLENOL) tablet 650 mg  650 mg Oral Q6H PRN Gertha Calkin, MD   650 mg at 03/24/23 1144   Or   acetaminophen (TYLENOL) suppository 650 mg  650 mg Rectal Q6H PRN Gertha Calkin, MD       amiodarone (PACERONE) tablet 100 mg  100 mg Oral BID Irena Cords V, MD   100 mg at 03/24/23 1700   ascorbic acid (VITAMIN C) tablet 500 mg  500 mg Oral BID Alford Highland, MD       Chlorhexidine Gluconate Cloth 2 % PADS 6 each  6 each Topical Daily Gertha Calkin, MD    6 each at 03/24/23 0947   enoxaparin (LOVENOX) injection 30 mg  30 mg Subcutaneous Q24H Wieting, Richard, MD       feeding supplement (ENSURE ENLIVE / ENSURE PLUS) liquid 237 mL  237 mL Oral BID BM Alford Highland, MD   237 mL at 03/24/23 1401   [START ON 03/25/2023] folic acid (FOLVITE) tablet 1 mg  1 mg Oral Daily Wieting, Richard, MD       labetalol (NORMODYNE) injection 10 mg  10 mg Intravenous Q2H PRN Manuela Schwartz, NP   10 mg at 03/24/23 0347   [START ON 03/25/2023] multivitamin with minerals tablet 1 tablet  1 tablet Oral Daily Wieting, Richard, MD       sodium chloride flush (NS) 0.9 % injection 3 mL  3 mL Intravenous Q12H Irena Cords V, MD   3 mL at 03/24/23 0947   thiamine (VITAMIN B1) 500 mg in sodium chloride 0.9 % 50 mL IVPB  500 mg Intravenous Q8H Wieting, Richard, MD 100 mL/hr at 03/24/23 1400 500 mg at 03/24/23 1400     Discharge Medications: Please see discharge summary for a list of discharge medications.  Relevant Imaging Results:  Relevant Lab Results:   Additional Information SS#: [id number]  Candie Chroman, LCSW

## 2023-03-24 NOTE — Assessment & Plan Note (Signed)
Continue supplements

## 2023-03-24 NOTE — Assessment & Plan Note (Deleted)
Discontinue metoprolol.  Okay to continue amiodarone.  Unable to do anticoagulation with subdural hematoma and subarachnoid hemorrhage.

## 2023-03-24 NOTE — Progress Notes (Signed)
Patient arrived in stable condition.

## 2023-03-24 NOTE — Assessment & Plan Note (Addendum)
See above plan. 

## 2023-03-24 NOTE — Telephone Encounter (Signed)
Venetia Night, MD  [place], RN Need telephone follow up with me in 2-4 weeks with noncontrast CT head prior

## 2023-03-24 NOTE — Procedures (Signed)
Patient Name: NORIYAH KUESTER  MRN: [id number]  Epilepsy Attending:Veronda Gabor Annabelle Harman  Referring Physician/Provider: Alford Highland, MD  Date: 03/24/2023 Duration: 30.22 mins  Patient history: 77yo F with left subdural hematoma and subarachnoid hemorrhage. EEG to evaluate for seizure  Level of alertness: Awake  AEDs during EEG study: None  Technical aspects: This EEG study was done with scalp electrodes positioned according to the 10-20 International system of electrode placement. Electrical activity was reviewed with band pass filter of 1-70Hz , sensitivity of 7 uV/mm, display speed of 21mm/sec with a 60Hz  notched filter applied as appropriate. EEG data were recorded continuously and digitally stored.  Video monitoring was available and reviewed as appropriate.  Description: The posterior dominant rhythm consists of 9-10 Hz activity of moderate voltage (25-35 uV) seen predominantly in posterior head regions, symmetric and reactive to eye opening and eye closing. Hyperventilation and photic stimulation were not performed.     IMPRESSION: This study is within normal limits. No seizures or epileptiform discharges were seen throughout the recording.  A normal interictal EEG does not exclude the diagnosis of epilepsy.  Jailon Schaible Annabelle Harman

## 2023-03-24 NOTE — TOC Initial Note (Signed)
Transition of Care [facility]) - Initial/Assessment Note    Patient Details  Name: Donna Evans MRN: [id number] Date of Birth: [birth year]  [facility]) CM/SW Contact:    Margarito Liner, LCSW Phone Number: 03/24/2023, 2:53 PM  Clinical Narrative:  Readmission prevention screen complete. CSW met with patient. No supports at bedside. CSW introduced role and explained that discharge planning would be discussed. PCP is Dr. Ellsworth Lennox. Patient was driving herself to appointments prior to admission. She uses [facility]. No issues obtaining medications. She was active with [facility] for PT prior to admission. Therapy is recommending SNF and patient is agreeable. First preference is [facility] because she went there a few months ago. No further concerns. CSW encouraged patient to contact CSW as needed. CSW will continue to follow patient for support and facilitate discharge to SNF once medically stable.                Expected Discharge Plan: Skilled Nursing Facility Barriers to Discharge: Continued Medical Work up   Patient Goals and CMS Choice   CMS Medicare.gov Compare Post Acute Care list provided to:: Patient        Expected Discharge Plan and Services     Post Acute Care Choice: Skilled Nursing Facility Living arrangements for the past 2 months: Single Family Home                                      Prior Living Arrangements/Services Living arrangements for the past 2 months: Single Family Home Lives with:: Self Patient language and need for interpreter reviewed:: Yes Do you feel safe going back to the place where you live?: Yes      Need for Family Participation in Patient Care: Yes (Comment) Care giver support system in place?: Yes (comment) Current home services: Home PT Criminal Activity/Legal Involvement Pertinent to Current Situation/Hospitalization: No - Comment as needed  Activities of Daily Living Home Assistive  Devices/Equipment: Dan Humphreys (specify type) ADL Screening (condition at time of admission) Patient's cognitive ability adequate to safely complete daily activities?: Yes Is the patient deaf or have difficulty hearing?: No Does the patient have difficulty seeing, even when wearing glasses/contacts?: No Does the patient have difficulty concentrating, remembering, or making decisions?: No Patient able to express need for assistance with ADLs?: Yes Does the patient have difficulty dressing or bathing?: No Independently performs ADLs?: Yes (appropriate for developmental age) Does the patient have difficulty walking or climbing stairs?: Yes Weakness of Legs: Both Weakness of Arms/Hands: None  Permission Sought/Granted Permission sought to share information with : Facility Industrial/product designer granted to share information with : Yes, Verbal Permission Granted     Permission granted to share info w AGENCY: SNF's        Emotional Assessment Appearance:: Appears stated age Attitude/Demeanor/Rapport: Engaged, Gracious Affect (typically observed): Accepting, Appropriate, Calm, Pleasant Orientation: : Oriented to Self, Oriented to Place, Oriented to  Time, Oriented to Situation Alcohol / Substance Use: Not Applicable Psych Involvement: No (comment)  Admission diagnosis:  Intracranial bleed [I62.9] Falls [W19.XXXA] Fall, initial encounter [id number]] Patient Active Problem List   Diagnosis Date Noted   Subdural hematoma 03/24/2023   Subarachnoid hemorrhage 03/24/2023   Alcohol use 03/24/2023   Protein-calorie malnutrition, severe 03/24/2023   Hypomagnesemia 03/24/2023   Acute urinary retention 03/24/2023   Goals of care, counseling/discussion 03/01/2023   Frequent falls 02/01/2023  Atrial fibrillation, chronic 02/01/2023   Hypokalemia 02/01/2023   Bleeding 02/01/2023   Fracture, finger 11/18/2022   Orthostatic hypotension 11/17/2022   Encephalopathy 11/10/2022   CKD  (chronic kidney disease), stage III 11/02/2022   Metabolic acidosis 11/02/2022   Steatohepatitis, non-alcoholic 11/02/2022   Paroxysmal atrial fibrillation with RVR 10/14/2022   Delirium due to another medical condition 10/08/2022   Bilateral leg edema    Head injury    Ground-level fall 10/06/2022   Acute encephalopathy 10/06/2022   Lymphedema 10/06/2022   Elevated LFTs 10/06/2022   HLD (hyperlipidemia) 08/10/2022   Elevated alkaline phosphatase level 08/10/2022   Elevated troponin 04/28/2022   Stroke 04/28/2022   Fall 04/28/2022   Acute renal failure superimposed on stage 3a chronic kidney disease 04/28/2022   Chronic diastolic CHF (congestive heart failure) 04/28/2022   Venous insufficiency 03/31/2022   SOB (shortness of breath) on exertion 03/08/2022   Pedal edema 03/08/2022   Closed fracture of distal end of left radius 12/22/2021   Low back pain 12/17/2021   Fracture of pelvis 11/13/2021   Bilateral leg weakness 05/13/2021   Memory change 03/04/2021   Imbalance 03/04/2021   Falls frequently 03/04/2021   Dizziness 03/04/2021   Senile osteoporosis 10/18/2019   Long term current use of opiate analgesic 07/04/2017   Long term prescription opiate use 07/04/2017   Opiate use 07/04/2017   Bursitis of shoulder 07/04/2017   Closed fracture of upper end of humerus 07/04/2017   Knee pain 07/04/2017   Localized, primary osteoarthritis 07/04/2017   Osteoarthritis of knee 07/04/2017   Pain in limb 07/04/2017   Sprain of wrist 07/04/2017   Pain in joint involving ankle and foot 07/04/2017   Impingement syndrome of left shoulder region 06/29/2017   Trochanteric bursitis 06/29/2017   AKI (acute kidney injury) 10/01/2016   Syncope 09/30/2016   Migraine headache 08/10/2016   Controlled substance agreement signed 06/12/2016   Insomnia 06/12/2016   Hypertension, benign 02/04/2016   Easy bruisability 11/13/2015   Osteoporosis 11/13/2015   Fibromyalgia syndrome 11/13/2015    Generalized anxiety disorder 11/13/2015   Chronic pain syndrome 11/13/2015   Overactive detrusor 09/11/2015   Adrenal adenoma 09/02/2015   Chronic cystitis 09/02/2015   Incomplete bladder emptying 09/02/2015   Renal colic 09/02/2015   Gross hematuria 07/28/2015   History of reconstruction of both breasts 06/18/2015   S/P cardiac catheterization 06/18/2015   Acquired absence of both breasts and nipples 11/20/2014   Transient cerebral ischemia 11/28/2013   Hemiplegia of dominant side 11/20/2013   PCP:  Sherron Mondayejan-Sie, S Ahmed, MD Pharmacy:   [facility] [place] - [address] [place] [address] Phone: [phone] Fax: [phone]  [id number] [place], [place] - [address] [place] [address] Phone: [phone] Fax: [phone]  [id number] [place] [place] [facility] [address] [place] [address] Phone: [phone] Fax: [phone]  [facility] [address] [place] [address] Phone: [phone] Fax: [phone]     Social Determinants of Health (SDOH) Social History: SDOH Screenings   Food Insecurity: No Food Insecurity (03/24/2023)  Housing: Low Risk  (03/24/2023)  Transportation Needs: No Transportation Needs (03/24/2023)  Utilities: Not At Risk (03/24/2023)  Depression (PHQ2-9): High Risk (12/27/2022)  Tobacco Use: Medium Risk (03/23/2023)   SDOH Interventions: Housing Interventions: Intervention Not Indicated   Readmission Risk Interventions    03/24/2023    2:47 PM  Readmission Risk Prevention Plan  Transportation Screening Complete  Medication Review Oceanographer) Complete  PCP or Specialist appointment within 3-5 days of discharge Complete  HRI or Home Care Consult Complete  SW Recovery Care/Counseling Consult Complete  Palliative Care Screening Not Applicable  Skilled Nursing Facility Complete

## 2023-03-24 NOTE — Evaluation (Signed)
Physical Therapy Evaluation Patient Details Name: Donna Evans MRN: [id number] DOB: 02/16/1946 Today's Date: 03/24/2023  History of Present Illness  Donna Evans is an 77 y.o. female seen in the emergency room today brought by EMS for a fall.  She has a small amount of intracranial hemorrhage, includes subdural and subarachnoid components.  Patient also reported dysuria she has a history of UTIs patient also has past medical history of TIA A-fib, hypertension, GERD.  Patient reports right-sided rib pain that is worse with taking a deep breath.   Clinical Impression  Pt admitted with above diagnosis. Pt currently with functional limitations due to the deficits listed below (see PT Problem List). Pt received upright in chair agreeable to limited PT eval requesting to return to bed. Pt at baseline lives alone and mod-I using rollator. Pt has history of frequent falls during previous admissions reporting x1 fall since January.   To date pt [facility] Pt requires frequent VC's for safe hand placement for STS and Stand <> sit. PT does endorse mild dizziness throughout reporting significant LE weakness deferring gait due to her weakness, dizziness and her head ache. Pt minguard for LE's to return to supine with all needs in reach. VSS throughout mobility. Pt below baseline level of function thus will benefit from f/u PT services to address acute deficits.      Recommendations for follow up therapy are one component of a multi-disciplinary discharge planning process, led by the attending physician.  Recommendations may be updated based on patient status, additional functional criteria and insurance authorization.  Follow Up Recommendations Can patient physically be transported by private vehicle: Yes     Assistance Recommended at Discharge Intermittent Supervision/Assistance  Patient can return home with the following  A little help with walking and/or  transfers;A little help with bathing/dressing/bathroom;Assistance with cooking/housework;Assist for transportation;Help with stairs or ramp for entrance    Equipment Recommendations Other (comment) (TBD by next venue of care)  Recommendations for Other Services       Functional Status Assessment Patient has had a recent decline in their functional status and demonstrates the ability to make significant improvements in function in a reasonable and predictable amount of time.     Precautions / Restrictions Precautions Precautions: Fall Precaution Comments: +orthostatics Restrictions Weight Bearing Restrictions: No      Mobility  Bed Mobility Overal bed mobility: Needs Assistance Bed Mobility: Sit to Supine       Sit to supine: Min guard   General bed mobility comments: at LE's Patient Response: Cooperative  Transfers Overall transfer level: Needs assistance Equipment used: Rolling walker (2 wheels) Transfers: Sit to/from Stand, Bed to chair/wheelchair/BSC Sit to Stand: Min guard   Step pivot transfers: Min guard       General transfer comment: VC's for hand placement. Effortful but completes without physical support    Ambulation/Gait               General Gait Details: pt deferring due to her head ache  Stairs            Wheelchair Mobility    Modified Rankin (Stroke Patients Only)       Balance Overall balance assessment: Needs assistance Sitting-balance support: Bilateral upper extremity supported, Feet supported Sitting balance-Leahy Scale: Good     Standing balance support: Bilateral upper extremity supported, During functional activity Standing balance-Leahy Scale: Fair Standing balance comment: using RW  Pertinent Vitals/Pain Pain Assessment Pain Assessment: Faces Faces Pain Scale: Hurts little more Pain Location: R side of her head and cervical spine Pain Descriptors / Indicators: Aching,  Discomfort Pain Intervention(s): Limited activity within patient's tolerance, Monitored during session, Repositioned    Home Living Family/patient expects to be discharged to:: Private residence Living Arrangements: Alone Available Help at Discharge: Available PRN/intermittently;Friend(s);Home health Type of Home: House Home Access: Stairs to enter Entrance Stairs-Rails: None Entrance Stairs-Number of Steps: 1   Home Layout: One level Home Equipment: Shower seat;Grab bars - toilet;Rollator (4 wheels);Cane - single Librarian, academic (2 wheels)      Prior Function Prior Level of Function : Needs assist;History of Falls (last six months)             Mobility Comments: endorses 1 fall in last 3 months       Hand Dominance        Extremity/Trunk Assessment   Upper Extremity Assessment Upper Extremity Assessment: Defer to OT evaluation    Lower Extremity Assessment Lower Extremity Assessment: Generalized weakness    Cervical / Trunk Assessment Cervical / Trunk Assessment: Normal  Communication   Communication: No difficulties  Cognition Arousal/Alertness: Awake/alert Behavior During Therapy: WFL for tasks assessed/performed Overall Cognitive Status: Within Functional Limits for tasks assessed                                          General Comments General comments (skin integrity, edema, etc.): VSS throughout    Exercises Other Exercises Other Exercises: Role of PT in acute setting, safe use of AD   Assessment/Plan    PT Assessment Patient needs continued PT services  PT Problem List Decreased strength;Decreased activity tolerance;Decreased balance;Decreased mobility       PT Treatment Interventions DME instruction;Therapeutic exercise;Gait training;Balance training;Stair training;Neuromuscular re-education;Functional mobility training;Therapeutic activities;Patient/family education    PT Goals (Current goals can be found in the Care  Plan section)  Acute Rehab PT Goals Patient Stated Goal: Improve her strength PT Goal Formulation: With patient Time For Goal Achievement: 04/07/23 Potential to Achieve Goals: Good    Frequency Min 3X/week     Co-evaluation               AM-PAC PT "6 Clicks" Mobility  Outcome Measure Help needed turning from your back to your side while in a flat bed without using bedrails?: A Little Help needed moving from lying on your back to sitting on the side of a flat bed without using bedrails?: A Little Help needed moving to and from a bed to a chair (including a wheelchair)?: A Little Help needed standing up from a chair using your arms (e.g., wheelchair or bedside chair)?: A Little Help needed to walk in hospital room?: A Lot Help needed climbing 3-5 steps with a railing? : A Lot 6 Click Score: 16    End of Session Equipment Utilized During Treatment: Gait belt Activity Tolerance: Patient tolerated treatment well;Patient limited by pain Patient left: in bed;with call bell/phone within reach Nurse Communication: Mobility status PT Visit Diagnosis: Muscle weakness (generalized) (M62.81);History of falling (Z91.81)    Time: 1129-1140 PT Time Calculation (min) (ACUTE ONLY): 11 min   Charges:   PT Evaluation $PT Eval Low Complexity: 1 Low          Krue Peterka M. Fairly IV, PT, DPT Physical Therapist- [place]  [facility]  03/24/2023, 12:03 PM

## 2023-03-24 NOTE — Progress Notes (Addendum)
0761 patient arrived after having MRI completed alert x4 able to make all needs noted patient has bruising on arms legs feet and right side of head patient able to give full history off eliquis for more than 1 week. Patient reports being dizzy at home and falling last fall in 10/2022 per patient.  0300 PRN medication for BP 0400 electrolytes replaced 0420 patient converted to NSR EKG completed

## 2023-03-24 NOTE — Progress Notes (Signed)
1530-Report was called to [facility]. 1600-Pt was transferred to room 129. Pts v/s were stable and pt had no c/o pain or discomfort noted or stated.

## 2023-03-24 NOTE — Assessment & Plan Note (Addendum)
metoprolol yesterday with fast heart rate.  Continue to monitor orthostatic vital signs

## 2023-03-25 ENCOUNTER — Inpatient Hospital Stay: Payer: PPO

## 2023-03-25 DIAGNOSIS — R569 Unspecified convulsions: Secondary | ICD-10-CM | POA: Diagnosis not present

## 2023-03-25 DIAGNOSIS — S2243XA Multiple fractures of ribs, bilateral, initial encounter for closed fracture: Secondary | ICD-10-CM

## 2023-03-25 DIAGNOSIS — I609 Nontraumatic subarachnoid hemorrhage, unspecified: Secondary | ICD-10-CM | POA: Diagnosis not present

## 2023-03-25 DIAGNOSIS — R748 Abnormal levels of other serum enzymes: Secondary | ICD-10-CM

## 2023-03-25 DIAGNOSIS — S2249XA Multiple fractures of ribs, unspecified side, initial encounter for closed fracture: Secondary | ICD-10-CM | POA: Insufficient documentation

## 2023-03-25 DIAGNOSIS — R55 Syncope and collapse: Secondary | ICD-10-CM | POA: Diagnosis not present

## 2023-03-25 DIAGNOSIS — S32020A Wedge compression fracture of second lumbar vertebra, initial encounter for closed fracture: Secondary | ICD-10-CM | POA: Insufficient documentation

## 2023-03-25 DIAGNOSIS — S065XAA Traumatic subdural hemorrhage with loss of consciousness status unknown, initial encounter: Secondary | ICD-10-CM | POA: Diagnosis not present

## 2023-03-25 DIAGNOSIS — I480 Paroxysmal atrial fibrillation: Secondary | ICD-10-CM

## 2023-03-25 DIAGNOSIS — R338 Other retention of urine: Secondary | ICD-10-CM | POA: Diagnosis not present

## 2023-03-25 LAB — COMPREHENSIVE METABOLIC PANEL
ALT: 32 U/L (ref 0–44)
AST: 55 U/L — ABNORMAL HIGH (ref 15–41)
Albumin: 2.8 g/dL — ABNORMAL LOW (ref 3.5–5.0)
Alkaline Phosphatase: 526 U/L — ABNORMAL HIGH (ref 38–126)
Anion gap: 4 — ABNORMAL LOW (ref 5–15)
BUN: 22 mg/dL (ref 8–23)
CO2: 20 mmol/L — ABNORMAL LOW (ref 22–32)
Calcium: 8 mg/dL — ABNORMAL LOW (ref 8.9–10.3)
Chloride: 107 mmol/L (ref 98–111)
Creatinine, Ser: 1.05 mg/dL — ABNORMAL HIGH (ref 0.44–1.00)
GFR, Estimated: 55 mL/min — ABNORMAL LOW (ref >60)
Glucose, Bld: 109 mg/dL — ABNORMAL HIGH (ref 70–99)
Potassium: 4.6 mmol/L (ref 3.5–5.1)
Sodium: 131 mmol/L — ABNORMAL LOW (ref 135–145)
Total Bilirubin: 0.8 mg/dL (ref 0.3–1.2)
Total Protein: 6.3 g/dL — ABNORMAL LOW (ref 6.5–8.1)

## 2023-03-25 LAB — PHOSPHORUS: Phosphorus: 2 mg/dL — ABNORMAL LOW (ref 2.5–4.6)

## 2023-03-25 LAB — MAGNESIUM: Magnesium: 2.5 mg/dL — ABNORMAL HIGH (ref 1.7–2.4)

## 2023-03-25 LAB — CBC
HCT: 33.6 % — ABNORMAL LOW (ref 36.0–46.0)
Hemoglobin: 11.3 g/dL — ABNORMAL LOW (ref 12.0–15.0)
MCH: 31.2 pg (ref 26.0–34.0)
MCHC: 33.6 g/dL (ref 30.0–36.0)
MCV: 92.8 fL (ref 80.0–100.0)
Platelets: 211 10*3/uL (ref 150–400)
RBC: 3.62 MIL/uL — ABNORMAL LOW (ref 3.87–5.11)
RDW: 15.5 % (ref 11.5–15.5)
WBC: 6.6 10*3/uL (ref 4.0–10.5)
nRBC: 0 % (ref 0.0–0.2)

## 2023-03-25 LAB — VITAMIN D 25 HYDROXY (VIT D DEFICIENCY, FRACTURES): Vit D, 25-Hydroxy: 102.88 ng/mL — ABNORMAL HIGH (ref 30–100)

## 2023-03-25 MED ORDER — DIPHENHYDRAMINE-ZINC ACETATE 2-0.1 % EX CREA
TOPICAL_CREAM | Freq: Three times a day (TID) | CUTANEOUS | Status: DC | PRN
  Filled 2023-03-25: qty 28

## 2023-03-25 MED ORDER — GABAPENTIN 300 MG PO CAPS: 300.0000 mg | ORAL_CAPSULE | Freq: Every day | ORAL | Status: DC

## 2023-03-25 MED ORDER — PANTOPRAZOLE SODIUM 40 MG PO TBEC
40.0000 mg | DELAYED_RELEASE_TABLET | Freq: Every day | ORAL | Status: DC
  Administered 2023-03-25 – 2023-03-29 (×5): 40 mg via ORAL
  Filled 2023-03-25 (×5): qty 1

## 2023-03-25 MED ORDER — BUSPIRONE HCL 10 MG PO TABS
5.0000 mg | ORAL_TABLET | Freq: Two times a day (BID) | ORAL | Status: DC
  Administered 2023-03-25: 5 mg via ORAL
  Filled 2023-03-25: qty 1

## 2023-03-25 MED ORDER — K PHOS MONO-SOD PHOS DI & MONO 155-852-130 MG PO TABS
500.0000 mg | ORAL_TABLET | Freq: Three times a day (TID) | ORAL | Status: AC
  Administered 2023-03-25 – 2023-03-26 (×4): 500 mg via ORAL
  Filled 2023-03-25 (×4): qty 2

## 2023-03-25 MED ORDER — LIDOCAINE 5 % EX PTCH
1.0000 | MEDICATED_PATCH | CUTANEOUS | Status: DC
  Administered 2023-03-25 – 2023-03-29 (×5): 1 via TRANSDERMAL
  Filled 2023-03-25 (×5): qty 1

## 2023-03-25 MED ORDER — GABAPENTIN 100 MG PO CAPS
200.0000 mg | ORAL_CAPSULE | Freq: Every day | ORAL | Status: DC
  Administered 2023-03-28: 200 mg via ORAL
  Filled 2023-03-25: qty 2

## 2023-03-25 MED ORDER — AMIODARONE HCL 200 MG PO TABS
100.0000 mg | ORAL_TABLET | Freq: Every day | ORAL | Status: DC
  Administered 2023-03-26: 100 mg via ORAL
  Filled 2023-03-25: qty 1

## 2023-03-25 MED ORDER — HYDRALAZINE HCL 20 MG/ML IJ SOLN
5.0000 mg | Freq: Four times a day (QID) | INTRAMUSCULAR | Status: DC | PRN
  Administered 2023-03-25: 5 mg via INTRAVENOUS
  Filled 2023-03-25: qty 1

## 2023-03-25 MED ORDER — LISINOPRIL 20 MG PO TABS
20.0000 mg | ORAL_TABLET | Freq: Every day | ORAL | Status: DC
  Administered 2023-03-25 – 2023-03-26 (×2): 20 mg via ORAL
  Filled 2023-03-25 (×2): qty 1

## 2023-03-25 MED ORDER — GABAPENTIN 100 MG PO CAPS
100.0000 mg | ORAL_CAPSULE | Freq: Every day | ORAL | Status: AC
  Administered 2023-03-25 – 2023-03-27 (×3): 100 mg via ORAL
  Filled 2023-03-25 (×3): qty 1

## 2023-03-25 NOTE — Assessment & Plan Note (Addendum)
Will check a GGT.  Could be Paget's disease of bone.  Hepatic steatosis seen on liver ultrasound last year.

## 2023-03-25 NOTE — Consult Note (Addendum)
Neurology Consultation Reason for Consult: c/f seizure  Requesting Physician: Fidela Juneau  CC: Fall  History is obtained from: Patient, chart review, family at bedside   HPI: Donna Evans is a 77 y.o. female with a past medical history significant for atrial fibrillation on Eliquis, hyperlipidemia chronic pain/fibromyalgia, right breast cancer, degenerative disc disease s/p lumbar surgery, nightly alcohol use, insomnia, depression, anxiety, migraine headaches, remote hypertension but now on midodrine, COPD, prior episodes of functional right-sided weakness, frequent falls felt to be secondary to sensory ataxia.  She presented with another fall and had some scattered traumatic intracerebral hemorrhage, nonoperative per neurosurgery.  Antiseizure medication prophylaxis was not used given concern for significant polypharmacy contributing to her falls.  However on history provided to primary team via caregiver, patient was described to have a spell where she developed a blank stare while walking and subsequently fell backwards with generalized shaking.  Therefore neurology was asked to comment on antiseizure medications.  Of note I had seen patient previously in October 2023 at which time she was again admitted with altered mental status and falls.  There was some concern for a chart diagnosis of lupus at that time; serological testing revealed negative ANA.  Lumbar puncture was reassuring.  She has subsequently had multiple presentations with falls and altered mental status (November 21, December 7, February 22, and this admission)  Caregiver manages her medications and patient has not sure of all of her medications but notes that multiple sedating medications have been recently discontinued due to her frequent falls including one leading to hip fracture  Regarding seizure risk factors denies any prior history of meningitis/encephalitis any prior history of seizures, any family history of  seizures, any prior history of intracerebral hemorrhage or brain surgery  ROS: Limited due to to her cognitive impairment at the time of presentation  Past Medical History:  Diagnosis Date   Allergic state    Anginal pain    Prinzmetal's angina   Anxiety    Arthritis    osteoarthritis   Breast cancer 1990   right breast cancer   Chronic pain    Chronic pain    COPD (chronic obstructive pulmonary disease)    Depression    Edema    Fibromyalgia    Foot fracture    Bilateral   GERD (gastroesophageal reflux disease)    History of kidney stones    Hypertension    Leg fracture, right    Low back pain    Lumbosacral neuritis    Medulloadrenal hyperfunction    Migraine headache    Peripheral neuropathy    Shoulder fracture, right    Stroke    TIA   Systemic lupus erythematosus    Thyroid disease    TIA (transient ischemic attack) 11/28/2013   Transient global amnesia 2011   Past Surgical History:  Procedure Laterality Date   ABDOMINAL HYSTERECTOMY  1981   APPENDECTOMY  1957   AUGMENTATION MAMMAPLASTY Bilateral 1990   saline submuscular   BACK SURGERY  1989   BILATERAL TOTAL MASTECTOMY WITH AXILLARY LYMPH NODE DISSECTION  1990   BREAST IMPLANT EXCHANGE Bilateral 05/11/2016   Procedure: REMOVAL AND REPLACEMENT OF BREAST IMPLANTS ;  Surgeon: Glenna Fellows, MD;  Location:  SURGERY CENTER;  Service: Plastics;  Laterality: Bilateral;   CAPSULECTOMY Bilateral 05/11/2016   Procedure: CAPSULECTOMY CAPSULORRAPHY ;  Surgeon: Glenna Fellows, MD;  Location:  SURGERY CENTER;  Service: Plastics;  Laterality: Bilateral;   CATARACT EXTRACTION W/PHACO Right 12/26/2018  Procedure: CATARACT EXTRACTION PHACO AND INTRAOCULAR LENS PLACEMENT (IOC) RIGHT;  Surgeon: Galen Manila, MD;  Location: [facility];  Service: Ophthalmology;  Laterality: Right;  [place]  00:46 CDE 8.50 Fluid pack lot # [id number]   CATARACT EXTRACTION W/PHACO Left 01/23/2019   Procedure: CATARACT  EXTRACTION PHACO AND INTRAOCULAR LENS PLACEMENT (IOC) LEFT;  Surgeon: Galen Manila, MD;  Location: [facility];  Service: Ophthalmology;  Laterality: Left;  [place]  00:45 CDE  8.41 fluid pack lot # [id number]   CHOLECYSTECTOMY  1979   COLONOSCOPY WITH PROPOFOL N/A 01/25/2018   Procedure: COLONOSCOPY WITH PROPOFOL;  Surgeon: Scot Jun, MD;  Location: [facility];  Service: Endoscopy;  Laterality: N/A;   COPD     ESOPHAGOGASTRODUODENOSCOPY (EGD) WITH PROPOFOL N/A 03/07/2018   Procedure: ESOPHAGOGASTRODUODENOSCOPY (EGD) WITH PROPOFOL;  Surgeon: Scot Jun, MD;  Location: [facility] ENDOSCOPY;  Service: Endoscopy;  Laterality: N/A;   FRACTURE SURGERY     HARDWARE REMOVAL Left 06/18/2020   Procedure: HARDWARE REMOVAL;  Surgeon: Lyndle Herrlich, MD;  Location: [facility];  Service: Orthopedics;  Laterality: Left;   HUMERUS IM NAIL Left 12/31/2019   Procedure: INTRAMEDULLARY (IM) NAIL HUMERAL;  Surgeon: Lyndle Herrlich, MD;  Location: [facility];  Service: Orthopedics;  Laterality: Left;   LIPOSUCTION Right 05/11/2016   Procedure: LIPOSUCTION;  Surgeon: Glenna Fellows, MD;  Location: [facility];  Service: Plastics;  Laterality: Right;   LIVER BIOPSY  2011   MASTECTOMY SUBCUTANEOUS Bilateral 1990   MASTOPEXY Bilateral 05/11/2016   Procedure: MASTOPEXY BILATERAL ;  Surgeon: Glenna Fellows, MD;  Location:  SURGERY CENTER;  Service: Plastics;  Laterality: Bilateral;   Current Outpatient Medications  Medication Instructions   acetaminophen (TYLENOL) 1,000 mg, Oral, Every 6 hours PRN   alum & mag hydroxide-simeth (MAALOX/MYLANTA) 200-200-20 MG/5ML suspension 15 mLs, Every 6 hours PRN   amiodarone (PACERONE) 200 MG tablet Take 1 tablet (200 mg total) by mouth 2 (two) times daily for 12 days, THEN 1 tablet (200 mg total) daily.   apixaban (ELIQUIS) 5 mg, 2 times daily   Belsomra 10 mg, Oral, Daily   busPIRone (BUSPAR) 5 mg, Oral, 2 times daily   calcium carbonate (TUMS - DOSED  IN MG ELEMENTAL CALCIUM) 500 MG chewable tablet 1 tablet, Oral, 3 times daily PRN   cyclobenzaprine (FLEXERIL) 10 mg, Oral, 3 times daily   feeding supplement (ENSURE ENLIVE / ENSURE PLUS) LIQD 237 mLs, Oral, 2 times daily between meals   folic acid (FOLVITE) 1 mg, Oral, Daily   lidocaine (LIDODERM) 5 % 1 patch, Transdermal, Every 24 hours, Remove & Discard patch within 12 hours or as directed by MD. Apply to back.   metoprolol tartrate (LOPRESSOR) 25 mg, Oral, 2 times daily   midodrine (PROAMATINE) 2.5 mg, Oral, 3 times daily with meals   rosuvastatin (CRESTOR) 10 mg, Oral, Daily at bedtime   torsemide (DEMADEX) 20 mg, Oral, Daily PRN   Vitamin D, Ergocalciferol, 50000 units CAPS 1 capsule, Oral, Weekly     Family History  Problem Relation Age of Onset   Atrial fibrillation Mother    Atrial fibrillation Sister    Cancer Sister    Diabetes Sister    Breast cancer Sister 48   Diabetes Father    Cancer Sister    Diabetes Sister    Atrial fibrillation Sister    Kidney disease Maternal Aunt     Social History:  reports that she quit smoking about 17 years ago. Her smoking use included  cigarettes. She has a 40.00 pack-year smoking history. She has never used smokeless tobacco. She reports that she does not currently use alcohol. She reports that she does not use drugs.   Exam: Current vital signs: BP (!) 183/65 (BP Location: Left Arm)   Pulse (!) 51   Temp 98.2 F (36.8 C)   Resp 18   Ht 5\' 7"  (1.702 m)   Wt 54.2 kg   SpO2 97%   BMI 18.71 kg/m  Vital signs in last 24 hours: Temp:  [97.4 F (36.3 C)-98.7 F (37.1 C)] 98.2 F (36.8 C) (04/12 1146) Pulse Rate:  [42-53] 51 (04/12 1146) Resp:  [14-20] 18 (04/12 1146) BP: (118-183)/(54-106) 183/65 (04/12 1146) SpO2:  [95 %-99 %] 97 % (04/12 1146) Weight:  [54.2 kg] 54.2 kg (04/12 0500)   Physical Exam  Constitutional: Appears cachectic, frail Psych: Affect pleasant and cooperative Eyes: No scleral injection HENT: No  oropharyngeal obstruction.  MSK: no joint deformities.  Cardiovascular: Perfusing extremities well, known history of atrial fibrillation Respiratory: Effort normal, non-labored breathing GI: Soft.  No distension. There is no tenderness.  Skin: Warm dry and intact visible skin, but multiple bruises throughout  Neuro: Mental Status: Patient is awake, alert, oriented to person, place, month, and situation, reports age as 68 instead of 24. No signs of aphasia or neglect Cranial Nerves: II: Visual Fields are full. Pupils are equal, round, and reactive to light.   III,IV, VI: EOMI without ptosis or diploplia.  V: Facial sensation is symmetric to light touch VII: Facial movement is symmetric.  VIII: hearing is hard of hearing at baseline but able to understand examiner for the most part X: Uvula elevates symmetrically XI: Shoulder shrug is symmetric. XII: tongue is midline without atrophy or fasciculations.  Motor: Tone is normal. Bulk is normal. 5/5 strength was present in all four extremities, except for bilateral hip flexion weakness 4-/5 Sensory: Reports intermittent numbness on the left lateral leg and calf, otherwise sensation is intact Deep Tendon Reflexes: 3+ and symmetric in the brachioradialis and patellae.  Positive Hoffmann's bilaterally.  Cerebellar: FNF and HKS are intact bilaterally within limits of hip flexion weakness Gait:  Deferred   I have reviewed labs in epic and the results pertinent to this consultation are:  Basic Metabolic Panel: Recent Labs  Lab 03/23/23 1457 03/24/23 0620 03/25/23 0255  NA 139 137 131*  K 3.4* 3.0* 4.6  CL 105 106 107  CO2 21* 21* 20*  GLUCOSE 93 139* 109*  BUN 22 18 22   CREATININE 1.26* 1.04* 1.05*  CALCIUM 8.0* 7.7* 8.0*  MG 1.6*  --  2.5*  PHOS  --   --  2.0*    CBC: Recent Labs  Lab 03/23/23 1457 03/24/23 0620 03/25/23 0255  WBC 9.7 7.4 6.6  HGB 13.3 11.0* 11.3*  HCT 41.1 33.4* 33.6*  MCV 94.5 93.6 92.8  PLT 214  207 211    Coagulation Studies: Recent Labs    03/23/23 1651  LABPROT 16.9*  INR 1.4*    Lab Results  Component Value Date   HGBA1C 5.1 04/28/2022   Lab Results  Component Value Date   CHOL 111 04/29/2022   HDL 42 04/29/2022   LDLCALC 46 04/29/2022   TRIG 114 04/29/2022   CHOLHDL 2.6 04/29/2022     I have reviewed the images obtained:  Head CT 4/10 initial  1. 2 mm LEFT anterior parafalcine subdural hematoma and 3 mm anterior LEFT temporal subdural hematoma. LEFT temporal subarachnoid hemorrhage  and possibly RIGHT sylvian fissure subarachnoid hemorrhage. No evidence of mass effect or midline shift. 2. Atrophy and chronic small-vessel white matter ischemic changes. 3. No evidence of acute injury to the cervical spine. Multilevel degenerative changes within the cervical spine contributing to multilevel central spinal and bony foraminal narrowing.  Head CT 4/10 22:00 Stable left temporal subdural and subarachnoid hemorrhages. Stable left parafalcine subdural hematoma. New area of hemorrhage in the anterior right temporal lobe, 1.2 cm.   MRI brain  1. Acute subdural hematoma overlying the left cerebral convexity, measuring up to 3 mm in maximal thickness. No significant mass effect or midline shift. 2. Scattered posttraumatic subarachnoid hemorrhage involving the peripheral left temporal lobe and left cerebellum. Few small superimposed hemorrhagic contusions about the left greater than right temporal lobes as above. Overall, these findings are relatively similar as compared to prior CT from 03/23/2023. 3. Underlying age-related cerebral atrophy with moderate chronic small vessel ischemic disease. No other acute intracranial abnormality.  Impression: No clear evidence for underlying epilepsy at this time, but certainly patient is at high risk secondary to temporal hemorrhages and subarachnoid hemorrhage at this time.  Additionally there is caregiver concern for  seizure activity (though this potentially was post syncopal convulsion as described) and the fact that routine EEGs cannot rule out epilepsy, I do think it would be reasonable to substitute a pain/anxiety medication that has good efficacy for seizures as well.  Patient is amenable to reinstituting gabapentin and stopping buspirone.  Given her renal function with creatinine clearance of 39, will start gabapentin and slowly titrate to 400 mg/day as below, with plan not to exceed 600 mg/day in 2 divided doses though potentially could be increased to 900 mg daily in 2-3 divided doses if she tolerates 600 mg/day well.  Gabapentin can be an effective antiepileptic medication in addition to helping with pain and anxiety  Recommendations: - Gabapentin 100 mg QHS x 3 days, then 200 mg QHS x 3 days, then 300 mg QHS x 3 days, then 300 mg QHS + 100 mg QAM going forward (if needed may consider continuing to titrate up to 600 mg total daily dose, definitely would not exceed 900 mg total daily dose due to her renal function) - Stop buspirone, stop benadryl - Use topical agents for itching, Benadryl zinc cream ordered - Eliquis held per neurosurgery, resume per Neurosurgery recs for stroke prevention - Outpatient neurocognitive testing requested by patient, agree that this would be beneficial given frequent cognitive concerns - Extensively counseled patient on alcohol cessation.  She reports she uses alcohol as a relaxation/sleep aid, sipping a shot over 2 to 3 hours only.  We discussed that this does constitute 1 full drink and given her age and weight is a substantial amount of alcohol and could certainly be constipating to her sleep difficulties - Inpatient neurology will be available as needed going forward, please reach out if any additional questions or concerns arise.  Recommendations conveyed to primary team via secure chat  Brooke Dare MD-PhD [facility] [phone] [facility]  coverage for [facility] is from 8 AM to 4 AM in-house and 4 PM to 8 PM by telephone/video. 8 PM to 8 AM emergent questions or overnight urgent questions should be addressed to Teleneurology On-call or [facility] neurohospitalist; contact information can be found on AMION

## 2023-03-25 NOTE — Telephone Encounter (Signed)
Patient still admitted.

## 2023-03-25 NOTE — Progress Notes (Signed)
Progress Note   Patient: Donna Evans TML:[id number] DOB: 12/05/1946 DOA: 03/23/2023     1 DOS: the patient was seen and examined on 03/25/2023   Brief hospital course: 77 year old female that lives alone.  Brought in with altered mental status and frequent falls.  Past medical history of TIA, A-fib, hypertension, GERD.  Patient complains of right-sided pain.  She has been off of blood thinners for a while.  Patient found to have subdural hematoma and subarachnoid hemorrhage on CT scan and MRI.  Bridget Hartshorn concerned that she may have had a seizure.  He has witnessed 1 fall in the past where she was staring blank and then started shaking and fell backwards.  EEG negative.  4/12.  Patient still complaining of a lot of pain.  Seen by neurology and gabapentin started at night.  Physical therapy recommending rehab.  Patient having some abdominal discomfort.  Told nursing staff about back discomfort.    Assessment and Plan: * Subdural hematoma Appreciate neurosurgery consultation.  Okay to work with physical therapy and Occupational Therapy.  No blood thinners for 2 weeks.  Follow-up as outpatient for repeat CT scan in 1 month as per neurosurgery.  Subarachnoid hemorrhage See above plan.    Syncope Orthostatic hypotension.  Patient given fluids yesterday.  Neurology starting gabapentin at night.   Compression fracture of L2 Progression from the last imaging.  Pain control.  Lidoderm patch.  Multiple rib fractures Chronic in nature  Hypophosphatemia Replace oral phosphorus.  Paroxysmal atrial fibrillation Metoprolol discontinued.  Amiodarone held today and will restart tomorrow at 100 mg daily.  Acute urinary retention Voiding trial.  Hypomagnesemia Replaced  Protein-calorie malnutrition, severe Continue supplements  Alcohol use Will give high-dose thiamine  Hypokalemia Replaced  Orthostatic hypotension Hold metoprolol, will check a.m. cortisol.  And with hypertension we  will start lisinopril.  CKD (chronic kidney disease), stage III CKD stage IIIa.  Creatinine 1.05 with a GFR 55.  Alkaline phosphatase elevation Will check a GGT.  Could be Paget's disease of bone.  Hepatic steatosis seen on liver ultrasound last year.        Subjective: Patient complains of right-sided pain.  Per nursing staff complained of some back pain.  Able to move all extremities.  Admitted with subdural hematoma and subarachnoid hemorrhage.  Physical Exam: Vitals:   03/25/23 0500 03/25/23 0744 03/25/23 1146 03/25/23 1416  BP:  (!) 154/61 (!) 183/65 132/62  Pulse:  (!) 53 (!) 51 (!) 55  Resp:  18 18   Temp:  98.3 F (36.8 C) 98.2 F (36.8 C)   TempSrc:      SpO2:  97% 97%   Weight: 54.2 kg     Height:       Physical Exam HENT:     Head: Normocephalic.     Mouth/Throat:     Pharynx: No oropharyngeal exudate.  Eyes:     General: Lids are normal.     Conjunctiva/sclera: Conjunctivae normal.  Cardiovascular:     Rate and Rhythm: Normal rate and regular rhythm.     Heart sounds: Normal heart sounds, S1 normal and S2 normal.  Pulmonary:     Breath sounds: No decreased breath sounds, wheezing, rhonchi or rales.  Abdominal:     Palpations: Abdomen is soft.     Tenderness: There is no abdominal tenderness.  Musculoskeletal:     Right lower leg: No swelling.     Left lower leg: No swelling.  Skin:    General: Skin is  warm.     Findings: No rash.  Neurological:     Mental Status: She is alert.     Comments: Able to move her arms and legs to command.     Data Reviewed: Hemoglobin 11.3 vitamin D 102, sodium 131, CO2 20, creatinine 1.05, alkaline phosphatase 526, total bilirubin 0.8, AST 55  Family Communication: Spoke with POA Mike on the phone, spoke with Bridget Hartshorn who helps take care of her  Disposition: Status is: Inpatient Remains inpatient appropriate because: Neurology starting gabapentin and adjusting medications today.  Planned Discharge Destination:  Skilled nursing facility    Time spent: 28 minutes  Author: Alford Highland, MD 03/25/2023 3:20 PM  For on call review [email or web address].

## 2023-03-25 NOTE — Progress Notes (Signed)
Physical Therapy Treatment Patient Details Name: Donna Evans MRN: [id number] DOB: 04/13/1946 Today's Date: 03/25/2023   History of Present Illness Donna Evans is an 77 y.o. female seen in the emergency room today brought by EMS for a fall.  She has a small amount of intracranial hemorrhage, includes subdural and subarachnoid components.  MD assessment also includes: L2 compression fracture, multiple rib fractures, and syncope. PMH includes: TIA, A-fib, hypertension, and GERD.    PT Comments    Pt was pleasant and motivated to participate during the session and put forth good effort throughout. Pt required no physical assistance during the session but did require cuing for proper sequencing with functional tasks.  Pt was able to amb a maximum of 30 feet with slow cadence and cues for upright posture and amb closer to the RW but was generally steady with no overt LOB.  Pt reported min SOB during ambulation with SpO2 and HR WNL on room air.  Pt will benefit from continued PT services upon discharge to safely address deficits listed in patient problem list for decreased caregiver assistance and eventual return to PLOF.     Recommendations for follow up therapy are one component of a multi-disciplinary discharge planning process, led by the attending physician.  Recommendations may be updated based on patient status, additional functional criteria and insurance authorization.  Follow Up Recommendations  Can patient physically be transported by private vehicle: Yes    Assistance Recommended at Discharge Intermittent Supervision/Assistance  Patient can return home with the following A little help with walking and/or transfers;A little help with bathing/dressing/bathroom;Assistance with cooking/housework;Assist for transportation;Help with stairs or ramp for entrance   Equipment Recommendations  Other (comment) (TBD at next venue of care)    Recommendations for Other Services        Precautions / Restrictions Precautions Precautions: Fall Precaution Comments: Watch orthostatics Restrictions Weight Bearing Restrictions: No     Mobility  Bed Mobility Overal bed mobility: Needs Assistance Bed Mobility: Sidelying to Sit, Rolling Rolling: Supervision Sidelying to sit: Supervision       General bed mobility comments: Min verbal cues for sequencing with log roll training    Transfers Overall transfer level: Needs assistance Equipment used: Rolling walker (2 wheels) Transfers: Sit to/from Stand Sit to Stand: Min guard           General transfer comment: Min verbal cues for sequencing for log roll training    Ambulation/Gait Ambulation/Gait assistance: Min guard Gait Distance (Feet): 30 Feet Assistive device: Rolling walker (2 wheels) Gait Pattern/deviations: Step-through pattern, Decreased step length - right, Decreased step length - left Gait velocity: decreased     General Gait Details: Min verbal cues for amb closer to the RW with upright posture   Stairs             Wheelchair Mobility    Modified Rankin (Stroke Patients Only)       Balance Overall balance assessment: Needs assistance Sitting-balance support: Feet supported, No upper extremity supported Sitting balance-Leahy Scale: Good     Standing balance support: During functional activity, Bilateral upper extremity supported, Reliant on assistive device for balance Standing balance-Leahy Scale: Fair                              Cognition Arousal/Alertness: Awake/alert Behavior During Therapy: WFL for tasks assessed/performed Overall Cognitive Status: Within Functional Limits for tasks assessed  Exercises Total Joint Exercises Ankle Circles/Pumps: AROM, Strengthening, Both, 10 reps Quad Sets: Strengthening, Both, 10 reps Gluteal Sets: Strengthening, Both, 10 reps Heel Slides: Strengthening, Both,  5 reps Long Arc Quad: Strengthening, Both, 10 reps, 5 reps Knee Flexion: Strengthening, Both, 5 reps, 10 reps Marching in Standing: Strengthening, Both, 5 reps, Seated Other Exercises Other Exercises: Log roll training    General Comments        Pertinent Vitals/Pain Pain Assessment Pain Assessment: No/denies pain    Home Living                          Prior Function            PT Goals (current goals can now be found in the care plan section) Progress towards PT goals: Progressing toward goals    Frequency    Min 3X/week      PT Plan Current plan remains appropriate    Co-evaluation              AM-PAC PT "6 Clicks" Mobility   Outcome Measure  Help needed turning from your back to your side while in a flat bed without using bedrails?: A Little Help needed moving from lying on your back to sitting on the side of a flat bed without using bedrails?: A Little Help needed moving to and from a bed to a chair (including a wheelchair)?: A Little Help needed standing up from a chair using your arms (e.g., wheelchair or bedside chair)?: A Little Help needed to walk in hospital room?: A Little Help needed climbing 3-5 steps with a railing? : A Lot 6 Click Score: 17    End of Session Equipment Utilized During Treatment: Gait belt Activity Tolerance: Patient tolerated treatment well Patient left: in bed;with call bell/phone within reach;with bed alarm set (Pt declined up in chair) Nurse Communication: Mobility status PT Visit Diagnosis: Muscle weakness (generalized) (M62.81);History of falling (Z91.81)     Time: 5686-1683 PT Time Calculation (min) (ACUTE ONLY): 19 min  Charges:  $Therapeutic Exercise: 8-22 mins                     D. Elly Modena PT, DPT 03/25/23, 4:22 PM

## 2023-03-25 NOTE — Assessment & Plan Note (Signed)
Progression from the last imaging.  Pain control.  Lidoderm patch.

## 2023-03-25 NOTE — Assessment & Plan Note (Signed)
Replaced 

## 2023-03-25 NOTE — Assessment & Plan Note (Signed)
Chronic in nature.

## 2023-03-25 NOTE — Assessment & Plan Note (Signed)
Metoprolol restarted yesterday and will be continued today.  Continue amiodarone.  Still in atrial fibrillation today.  Will give IV magnesium today.

## 2023-03-26 ENCOUNTER — Inpatient Hospital Stay: Payer: PPO

## 2023-03-26 DIAGNOSIS — S32020S Wedge compression fracture of second lumbar vertebra, sequela: Secondary | ICD-10-CM

## 2023-03-26 DIAGNOSIS — I609 Nontraumatic subarachnoid hemorrhage, unspecified: Secondary | ICD-10-CM | POA: Diagnosis not present

## 2023-03-26 DIAGNOSIS — I4891 Unspecified atrial fibrillation: Secondary | ICD-10-CM

## 2023-03-26 DIAGNOSIS — R55 Syncope and collapse: Secondary | ICD-10-CM | POA: Diagnosis not present

## 2023-03-26 DIAGNOSIS — S065XAA Traumatic subdural hemorrhage with loss of consciousness status unknown, initial encounter: Secondary | ICD-10-CM | POA: Diagnosis not present

## 2023-03-26 DIAGNOSIS — L03115 Cellulitis of right lower limb: Secondary | ICD-10-CM | POA: Diagnosis not present

## 2023-03-26 DIAGNOSIS — S2243XS Multiple fractures of ribs, bilateral, sequela: Secondary | ICD-10-CM

## 2023-03-26 LAB — GAMMA GT: GGT: 267 U/L — ABNORMAL HIGH (ref 7–50)

## 2023-03-26 LAB — COMPREHENSIVE METABOLIC PANEL
ALT: 44 U/L (ref 0–44)
AST: 79 U/L — ABNORMAL HIGH (ref 15–41)
Albumin: 2.7 g/dL — ABNORMAL LOW (ref 3.5–5.0)
Alkaline Phosphatase: 537 U/L — ABNORMAL HIGH (ref 38–126)
Anion gap: 9 (ref 5–15)
BUN: 18 mg/dL (ref 8–23)
CO2: 23 mmol/L (ref 22–32)
Calcium: 8.2 mg/dL — ABNORMAL LOW (ref 8.9–10.3)
Chloride: 103 mmol/L (ref 98–111)
Creatinine, Ser: 0.97 mg/dL (ref 0.44–1.00)
GFR, Estimated: >60 mL/min (ref >60)
Glucose, Bld: 91 mg/dL (ref 70–99)
Potassium: 3.9 mmol/L (ref 3.5–5.1)
Sodium: 135 mmol/L (ref 135–145)
Total Bilirubin: 1 mg/dL (ref 0.3–1.2)
Total Protein: 6.5 g/dL (ref 6.5–8.1)

## 2023-03-26 LAB — URIC ACID: Uric Acid, Serum: 2.5 mg/dL (ref 2.5–7.1)

## 2023-03-26 LAB — PHOSPHORUS: Phosphorus: 3.9 mg/dL (ref 2.5–4.6)

## 2023-03-26 LAB — CORTISOL-AM, BLOOD: Cortisol - AM: 16.5 ug/dL (ref 6.7–22.6)

## 2023-03-26 LAB — MAGNESIUM: Magnesium: 1.8 mg/dL (ref 1.7–2.4)

## 2023-03-26 MED ORDER — AMIODARONE HCL 200 MG PO TABS
100.0000 mg | ORAL_TABLET | Freq: Every day | ORAL | Status: DC
  Administered 2023-03-27 – 2023-03-29 (×3): 100 mg via ORAL
  Filled 2023-03-26 (×3): qty 1

## 2023-03-26 MED ORDER — METOPROLOL TARTRATE 25 MG PO TABS
25.0000 mg | ORAL_TABLET | Freq: Two times a day (BID) | ORAL | Status: DC
  Administered 2023-03-26 – 2023-03-29 (×6): 25 mg via ORAL
  Filled 2023-03-26 (×6): qty 1

## 2023-03-26 MED ORDER — METOPROLOL TARTRATE 5 MG/5ML IV SOLN
5.0000 mg | INTRAVENOUS | Status: DC | PRN
  Administered 2023-03-26 (×2): 5 mg via INTRAVENOUS
  Filled 2023-03-26 (×2): qty 5

## 2023-03-26 MED ORDER — THIAMINE MONONITRATE 100 MG PO TABS
100.0000 mg | ORAL_TABLET | Freq: Every day | ORAL | Status: DC
  Administered 2023-03-26 – 2023-03-29 (×4): 100 mg via ORAL
  Filled 2023-03-26 (×4): qty 1

## 2023-03-26 MED ORDER — METOPROLOL TARTRATE 50 MG PO TABS
50.0000 mg | ORAL_TABLET | Freq: Once | ORAL | Status: AC
  Administered 2023-03-26: 50 mg via ORAL
  Filled 2023-03-26: qty 1

## 2023-03-26 MED ORDER — CEFAZOLIN SODIUM-DEXTROSE 1-4 GM/50ML-% IV SOLN
1.0000 g | Freq: Three times a day (TID) | INTRAVENOUS | Status: DC
  Administered 2023-03-26 – 2023-03-28 (×7): 1 g via INTRAVENOUS
  Filled 2023-03-26 (×8): qty 50

## 2023-03-26 MED ORDER — AMIODARONE HCL 200 MG PO TABS: 100.0000 mg | ORAL_TABLET | Freq: Two times a day (BID) | ORAL | Status: DC

## 2023-03-26 MED ORDER — ENOXAPARIN SODIUM 40 MG/0.4ML IJ SOSY
40.0000 mg | PREFILLED_SYRINGE | INTRAMUSCULAR | Status: DC
  Administered 2023-03-26 – 2023-03-28 (×3): 40 mg via SUBCUTANEOUS
  Filled 2023-03-26 (×3): qty 0.4

## 2023-03-26 NOTE — Progress Notes (Addendum)
Called with rapid HR. EKG Hr in 140's Afib interpreted by me Given 5mg  iv metoprolol. Still waiting for oral metoprolol. HR improved around 110 Transfer to progressive Patient request to be DNR, order changed Physical Exam Cardiovascular:     Rate and Rhythm: Tachycardia present. Rhythm irregularly irregular.     Heart sounds: Normal heart sounds, S1 normal and S2 normal.  Pulmonary:     Breath sounds: Examination of the right-lower field reveals decreased breath sounds. Examination of the left-lower field reveals decreased breath sounds. Decreased breath sounds present. No wheezing, rhonchi or rales.     Dr Renae Gloss, MD 20 minutes Case discussed with nursing staff

## 2023-03-26 NOTE — Progress Notes (Signed)
Physical Therapy Treatment Patient Details Name: Donna Evans MRN: [id number] DOB: February 17, 1946 Today's Date: 03/26/2023   History of Present Illness Donna Evans is an 77 y.o. female seen in the emergency room today brought by EMS for a fall.  She has a small amount of intracranial hemorrhage, includes subdural and subarachnoid components.  MD assessment also includes: L2 compression fracture, multiple rib fractures, and syncope. PMH includes: TIA, A-fib, hypertension, and GERD.    PT Comments    Pt was pleasant and motivated to participate during the session and put forth good effort throughout. Per MD request ambulation deferred this session due to elevated HR but ok for pt to participate with PT other than ambulation.  Per MD ok to resume ambulation 03/27/23.  Pt required min A during log roll training and cues for sequencing along with close CGA during transfer training to/from [facility].  Pt reported no adverse symptoms during the session other than chronic R rib pain with HR in the low 100s to 110's.  Pt will benefit from continued PT services upon discharge to safely address deficits listed in patient problem list for decreased caregiver assistance and eventual return to PLOF.     Recommendations for follow up therapy are one component of a multi-disciplinary discharge planning process, led by the attending physician.  Recommendations may be updated based on patient status, additional functional criteria and insurance authorization.  Follow Up Recommendations  Can patient physically be transported by private vehicle: Yes    Assistance Recommended at Discharge Intermittent Supervision/Assistance  Patient can return home with the following A little help with walking and/or transfers;A little help with bathing/dressing/bathroom;Assistance with cooking/housework;Assist for transportation;Help with stairs or ramp for entrance   Equipment Recommendations  Other (comment) (TBD at next venue of  care)    Recommendations for Other Services       Precautions / Restrictions Precautions Precautions: Fall Precaution Comments: Watch orthostatics and HR Restrictions Weight Bearing Restrictions: No     Mobility  Bed Mobility Overal bed mobility: Needs Assistance Bed Mobility: Sidelying to Sit, Rolling, Sit to Sidelying Rolling: Supervision Sidelying to sit: Min assist     Sit to sidelying: Min assist General bed mobility comments: Min A for trunk control during sidelying to sit and min A for BLE control during sit to sidelying with min verbal and tactile cues for log roll sequencing    Transfers Overall transfer level: Needs assistance Equipment used: Rolling walker (2 wheels) Transfers: Sit to/from Stand     Step pivot transfers: Min guard       General transfer comment: Min verbal cues for hand placement    Ambulation/Gait               General Gait Details: deferred per MD request per above   Stairs             Wheelchair Mobility    Modified Rankin (Stroke Patients Only)       Balance Overall balance assessment: Needs assistance Sitting-balance support: Feet supported, No upper extremity supported Sitting balance-Leahy Scale: Good     Standing balance support: During functional activity, Bilateral upper extremity supported, Reliant on assistive device for balance Standing balance-Leahy Scale: Fair                              Cognition Arousal/Alertness: Awake/alert Behavior During Therapy: WFL for tasks assessed/performed Overall Cognitive Status: Within Functional Limits for tasks assessed  Exercises Total Joint Exercises Ankle Circles/Pumps: AROM, Strengthening, Both, 10 reps Quad Sets: Strengthening, Both, 10 reps Gluteal Sets: Strengthening, Both, 10 reps Heel Slides: Strengthening, Both, 5 reps Long Arc Quad: Strengthening, Both, 10 reps Knee Flexion:  Strengthening, Both, 10 reps Other Exercises Other Exercises: Log roll training/review Other Exercises: HEP education for BLE APs, QS, and GS x 10 each every 1-2 hours daily    General Comments        Pertinent Vitals/Pain Pain Assessment Pain Assessment: 0-10 Pain Score: 8  Pain Location: R side/ribs Pain Descriptors / Indicators: Aching, Sore Pain Intervention(s): Repositioned, Premedicated before session, Monitored during session    Home Living                          Prior Function            PT Goals (current goals can now be found in the care plan section) Progress towards PT goals: PT to reassess next treatment    Frequency    Min 3X/week      PT Plan Current plan remains appropriate    Co-evaluation              AM-PAC PT "6 Clicks" Mobility   Outcome Measure  Help needed turning from your back to your side while in a flat bed without using bedrails?: A Little Help needed moving from lying on your back to sitting on the side of a flat bed without using bedrails?: A Little Help needed moving to and from a bed to a chair (including a wheelchair)?: A Little Help needed standing up from a chair using your arms (e.g., wheelchair or bedside chair)?: A Little Help needed to walk in hospital room?: A Little Help needed climbing 3-5 steps with a railing? : A Lot 6 Click Score: 17    End of Session Equipment Utilized During Treatment: Gait belt Activity Tolerance: Patient tolerated treatment well Patient left: in bed;with call bell/phone within reach;with bed alarm set Nurse Communication: Mobility status PT Visit Diagnosis: Muscle weakness (generalized) (M62.81);History of falling (Z91.81)     Time: 1610-9604 PT Time Calculation (min) (ACUTE ONLY): 23 min  Charges:  $Therapeutic Exercise: 8-22 mins $Therapeutic Activity: 8-22 mins                    D. Scott Kerria Sapien PT, DPT 03/26/23, 4:08 PM

## 2023-03-26 NOTE — Assessment & Plan Note (Addendum)
Change ancef to keflex

## 2023-03-26 NOTE — TOC Progression Note (Signed)
Transition of Care [facility]) - Progression Note    Patient Details  Name: ALIYAHA AULAKH MRN: [id number] Date of Birth: 24-Apr-1946  [facility] CM/SW Contact  Allena Katz, LCSW Phone Number: 03/26/2023, 11:05 AM  Clinical Narrative:   [facility] has accepted pt for rehab and this is her preference. CSW will start insurance auth for Monday.     Expected Discharge Plan: Skilled Nursing Facility Barriers to Discharge: Continued Medical Work up  Expected Discharge Plan and Services     Post Acute Care Choice: Skilled Nursing Facility Living arrangements for the past 2 months: Single Family Home                                       Social Determinants of Health (SDOH) Interventions SDOH Screenings   Food Insecurity: No Food Insecurity (03/24/2023)  Housing: Low Risk  (03/24/2023)  Transportation Needs: No Transportation Needs (03/24/2023)  Utilities: Not At Risk (03/24/2023)  Depression (PHQ2-9): High Risk (12/27/2022)  Tobacco Use: Medium Risk (03/23/2023)    Readmission Risk Interventions    03/24/2023    2:47 PM  Readmission Risk Prevention Plan  Transportation Screening Complete  Medication Review (RN Care Manager) Complete  PCP or Specialist appointment within 3-5 days of discharge Complete  HRI or Home Care Consult Complete  SW Recovery Care/Counseling Consult Complete  Palliative Care Screening Not Applicable  Skilled Nursing Facility Complete

## 2023-03-26 NOTE — Progress Notes (Signed)
Progress Note   Patient: Donna Evans FBP:[id number] DOB: Dec 03, 1946 DOA: 03/23/2023     2 DOS: the patient was seen and examined on 03/26/2023   Brief hospital course: 77 year old female that lives alone.  Brought in with altered mental status and frequent falls.  Past medical history of TIA, A-fib, hypertension, GERD.  Patient complains of right-sided pain.  She has been off of blood thinners for a while.  Patient found to have subdural hematoma and subarachnoid hemorrhage on CT scan and MRI.  Bridget Hartshorn concerned that she may have had a seizure.  He has witnessed 1 fall in the past where she was staring blank and then started shaking and fell backwards.  EEG negative.  4/12.  Patient still complaining of a lot of pain.  Seen by neurology and gabapentin started at night.  Physical therapy recommending rehab.  Patient having some abdominal discomfort.  Told nursing staff about back discomfort. 4/13.  Patient complains of pains all over.  Pointed out redness on her right foot, so I started antibiotics for cellulitis.    Assessment and Plan: * Subdural hematoma Appreciate neurosurgery consultation.  Okay to work with physical therapy and Occupational Therapy.  No blood thinners for 2 weeks.  Follow-up as outpatient for repeat CT scan in 1 month as per neurosurgery.  Subarachnoid hemorrhage See above plan.    Cellulitis of right foot Ancef prescribed  Syncope Orthostatic hypotension.  Neurology starting gabapentin at night.   Compression fracture of L2 Progression from the last imaging.  Pain control.  Lidoderm patch.  Multiple rib fractures Chronic in nature  Hypophosphatemia Replaced  Paroxysmal atrial fibrillation Metoprolol discontinued.  Amiodarone 100 mg daily.  Acute urinary retention Voiding trial.  Hypomagnesemia Replaced  Protein-calorie malnutrition, severe Continue supplements  Alcohol use Completed high-dose thiamine will give oral thiamine  now.  Hypokalemia Replaced  Orthostatic hypotension Held metoprolol.  Patient now with hypertension started on lisinopril.  A.m. cortisol normal.  CKD (chronic kidney disease), stage III CKD stage IIIa.  Creatinine improved to 0.97 with a GFR greater than 60  Alkaline phosphatase elevation GGT elevated.  Will get right upper quadrant ultrasound tomorrow morning.        Subjective: Patient thinks that she is getting worse.  Has pains all over her body.  In her back she points to pain more on the left lateral hip.  Patient complains of pain on the right side of her body.  She showed me some redness on her foot.  Antibiotics started for cellulitis.  Initially came in with fall and found to have subdural and subarachnoid hemorrhage.  Patient complains of trouble sleeping.  Physical Exam: Vitals:   03/26/23 0604 03/26/23 0745 03/26/23 0950 03/26/23 1128  BP: (!) 145/72 (!) 160/75 (!) 187/75 139/62  Pulse: 64 72 72 74  Resp: 19 18  17   Temp: 98.4 F (36.9 C) 99.2 F (37.3 C)  97.9 F (36.6 C)  TempSrc: Oral     SpO2: 97% 98%  98%  Weight:      Height:       Physical Exam HENT:     Head: Normocephalic.     Mouth/Throat:     Pharynx: No oropharyngeal exudate.  Eyes:     General: Lids are normal.     Conjunctiva/sclera: Conjunctivae normal.  Cardiovascular:     Rate and Rhythm: Normal rate and regular rhythm.     Heart sounds: Normal heart sounds, S1 normal and S2 normal.  Pulmonary:  Breath sounds: No decreased breath sounds, wheezing, rhonchi or rales.  Abdominal:     Palpations: Abdomen is soft.     Tenderness: There is no abdominal tenderness.  Musculoskeletal:     Right lower leg: No swelling.     Left lower leg: No swelling.     Comments: No pain palpating over lumbar spine.  Skin:    General: Skin is warm.     Comments: Redness on the top of the right foot.  Neurological:     Mental Status: She is alert.     Comments: Patient moves all extremities.      Data Reviewed: Sodium 135, creatinine 0.97  Family Communication: Spoke with [name] on the phone  Disposition: Status is: Inpatient Remains inpatient appropriate because: Awaiting insurance authorization to go out to rehab likely Monday.  Planned Discharge Destination: Rehab    Time spent: 28 minutes  Author: Alford Highland, MD 03/26/2023 2:31 PM  For on call review [email or web address].

## 2023-03-27 DIAGNOSIS — S065XAA Traumatic subdural hemorrhage with loss of consciousness status unknown, initial encounter: Secondary | ICD-10-CM | POA: Diagnosis not present

## 2023-03-27 DIAGNOSIS — Z515 Encounter for palliative care: Secondary | ICD-10-CM | POA: Diagnosis not present

## 2023-03-27 DIAGNOSIS — I609 Nontraumatic subarachnoid hemorrhage, unspecified: Secondary | ICD-10-CM | POA: Diagnosis not present

## 2023-03-27 DIAGNOSIS — L03115 Cellulitis of right lower limb: Secondary | ICD-10-CM | POA: Diagnosis not present

## 2023-03-27 DIAGNOSIS — I480 Paroxysmal atrial fibrillation: Secondary | ICD-10-CM | POA: Diagnosis not present

## 2023-03-27 DIAGNOSIS — Z7189 Other specified counseling: Secondary | ICD-10-CM | POA: Diagnosis not present

## 2023-03-27 DIAGNOSIS — I951 Orthostatic hypotension: Secondary | ICD-10-CM | POA: Diagnosis not present

## 2023-03-27 LAB — PHOSPHORUS: Phosphorus: 4.1 mg/dL (ref 2.5–4.6)

## 2023-03-27 LAB — MAGNESIUM: Magnesium: 1.7 mg/dL (ref 1.7–2.4)

## 2023-03-27 MED ORDER — MAGNESIUM SULFATE 2 GM/50ML IV SOLN
2.0000 g | Freq: Once | INTRAVENOUS | Status: AC
  Administered 2023-03-27: 2 g via INTRAVENOUS
  Filled 2023-03-27: qty 50

## 2023-03-27 MED ORDER — TRAZODONE HCL 50 MG PO TABS
50.0000 mg | ORAL_TABLET | Freq: Every day | ORAL | Status: DC
  Administered 2023-03-27 – 2023-03-28 (×2): 50 mg via ORAL
  Filled 2023-03-27 (×2): qty 1

## 2023-03-27 NOTE — Progress Notes (Signed)
Progress Note   Patient: MUNIRAH MUNNELL ACZ:[id number] DOB: 1946/07/10 DOA: 03/23/2023     3 DOS: the patient was seen and examined on 03/27/2023   Brief hospital course: 77 year old female that lives alone.  Brought in with altered mental status and frequent falls.  Past medical history of TIA, A-fib, hypertension, GERD.  Patient complains of right-sided pain.  She has been off of blood thinners for a while.  Patient found to have subdural hematoma and subarachnoid hemorrhage on CT scan and MRI.  Bridget Hartshorn concerned that she may have had a seizure.  He has witnessed 1 fall in the past where she was staring blank and then started shaking and fell backwards.  EEG negative.  4/12.  Patient still complaining of a lot of pain.  Seen by neurology and gabapentin started at night.  Physical therapy recommending rehab.  Patient having some abdominal discomfort.  Told nursing staff about back discomfort. 4/13.  Patient complains of pains all over.  Pointed out redness on her right foot, so I started antibiotics for cellulitis.  Patient went into rapid atrial fibrillation and needed to restart metoprolol. 4/14.  Patient complains of not sleeping.  Trazodone ordered.     Assessment and Plan: * Subdural hematoma Appreciate neurosurgery consultation.  Okay to work with physical therapy and Occupational Therapy.  No blood thinners for 2 weeks.  Follow-up as outpatient for repeat CT scan in 1 month as per neurosurgery.  Subarachnoid hemorrhage See above plan.    Cellulitis of right foot Ancef prescribed  Paroxysmal atrial fibrillation Metoprolol restarted yesterday and will be continued today.  Continue amiodarone.  Still in atrial fibrillation today.  Will give IV magnesium today.  Syncope Orthostatic hypotension.  Check orthostatic vital signs.  Neurology starting gabapentin at night.   Compression fracture of L2 Progression from the last imaging.  Pain control.  Lidoderm patch.  Multiple rib  fractures Chronic in nature  Hypophosphatemia Replaced  Acute urinary retention Resolved  Hypomagnesemia IV magnesium today  Protein-calorie malnutrition, severe Continue supplements  Alcohol use Completed high-dose thiamine will give oral thiamine now.  Hypokalemia Replaced  Orthostatic hypotension Restarted metoprolol yesterday with fast heart rate.  Continue to monitor orthostatic vital signs  CKD (chronic kidney disease), stage III CKD stage IIIa.  Creatinine improved to 0.97 with a GFR greater than 60  Alkaline phosphatase elevation GGT elevated.  Right upper quadrant ultrasound negative.  Still could be Paget's disease of the bone.        Subjective: Patient does have some right-sided pains.  Went into rapid atrial fibrillation yesterday afternoon.  Still in atrial fibrillation this morning but heart rate better controlled.  Initially admitted with falls and found to have subarachnoid hemorrhage and subdural hematomas.  Physical Exam: Vitals:   03/27/23 0419 03/27/23 0754 03/27/23 0839 03/27/23 1148  BP: (!) 146/94 (!) 161/93 (!) 145/100 111/73  Pulse: 95 (!) 110 97 95  Resp: 20 16 18 18   Temp: 98.1 F (36.7 C) (!) 97.5 F (36.4 C)  98.4 F (36.9 C)  TempSrc: Oral Oral    SpO2: 95% 98%  99%  Weight: 52.2 kg     Height:       Physical Exam HENT:     Head: Normocephalic.     Mouth/Throat:     Pharynx: No oropharyngeal exudate.  Eyes:     General: Lids are normal.     Conjunctiva/sclera: Conjunctivae normal.  Cardiovascular:     Rate and Rhythm: Normal rate. Rhythm  irregularly irregular.     Heart sounds: S1 normal and S2 normal. Murmur heard.     Systolic murmur is present with a grade of 2/6.  Pulmonary:     Breath sounds: Examination of the right-lower field reveals decreased breath sounds. Examination of the left-lower field reveals decreased breath sounds. Decreased breath sounds present. No wheezing, rhonchi or rales.  Abdominal:      Palpations: Abdomen is soft.     Tenderness: There is no abdominal tenderness.  Musculoskeletal:     Right lower leg: No swelling.     Left lower leg: No swelling.     Comments: No pain palpating over lumbar spine.  Skin:    General: Skin is warm.     Comments: Redness on the top of the right foot.  Neurological:     Mental Status: She is alert.     Comments: Patient moves all extremities.     Data Reviewed: Magnesium 1.7, phosphorus 4.1  Family Communication: Left message for [name]  Disposition: Status is: Inpatient Remains inpatient appropriate because: Awaiting insurance authorization for rehab  Planned Discharge Destination: Rehab    Time spent: 28 minutes  Author: Alford Highland, MD 03/27/2023 12:14 PM  For on call review [email or web address].

## 2023-03-27 NOTE — Consult Note (Signed)
Consultation Note Date: 03/27/2023   Patient Name: Donna Evans  DOB: 01/11/1946  MRN: [id number]  Age / Sex: 77 y.o., female  PCP: Sherron Monday, MD Referring Physician: Alford Highland, MD  Reason for Consultation: Establishing goals of care   HPI/Brief Hospital Course: 77 y.o. female  with past medical history of atrial fibrillation on Eliquis, chronic pain/fibromyalgia, right breast cancer, DDD s/p lumbar surgery, HLD, ETOH dependence (1 shot nightly), insomnia, anxiety, depression, migraine headaches, COPD and frequent falls admitted from home on 03/23/2023 with fall with head pain and AMS.  Head CT 4/10 initial  1. 2 mm LEFT anterior parafalcine subdural hematoma and 3 mm anterior LEFT temporal subdural hematoma. LEFT temporal subarachnoid hemorrhage and possibly RIGHT sylvian fissure subarachnoid hemorrhage. No evidence of mass effect or midline shift. 2. Atrophy and chronic small-vessel white matter ischemic changes. 3. No evidence of acute injury to the cervical spine. Multilevel degenerative changes within the cervical spine contributing to multilevel central spinal and bony foraminal narrowing.   Head CT 4/10 22:00 Stable left temporal subdural and subarachnoid hemorrhages. Stable left parafalcine subdural hematoma. New area of hemorrhage in the anterior right temporal lobe, 1.2 cm.   MRI brain  1. Acute subdural hematoma overlying the left cerebral convexity, measuring up to 3 mm in maximal thickness. No significant mass effect or midline shift. 2. Scattered posttraumatic subarachnoid hemorrhage involving the peripheral left temporal lobe and left cerebellum. Few small superimposed hemorrhagic contusions about the left greater than right temporal lobes as above. Overall, these findings are relatively similar as compared to prior CT from 03/23/2023. 3. Underlying age-related cerebral atrophy with moderate chronic small vessel  ischemic disease. No other acute intracranial abnormality.  Palliative medicine was consulted for assisting with goals of care conversations.  Subjective:  Extensive chart review has been completed prior to meeting patient including labs, vital signs, imaging, progress notes, orders, and available advanced directive documents from current and previous encounters.  Introduced myself as a Publishing rights manager as a member of the palliative care team. Explained palliative medicine is specialized medical care for people living with serious illness. It focuses on providing relief from the symptoms and stress of a serious illness. The goal is to improve quality of life for both the patient and the family.   Visited with Donna Evans at her bedside. Awake and alert, c/o of generalized pain specifically to right ribs. Medication adjustments being made by primary team. No family/support persons present during time of visit.  Donna Evans shares a brief life review. She is widowed of 5 years and lost her young son while he was awaiting a kidney transplant. Since the passing of her son she has devoted her time to passing a bill Physicist, medical) in honor of her son with emphasis on organ transplantation. 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 She currently lives alone. Has a nice and nephew that are both HCPOA but live live about an hour from her home. She has a close friend, Bridget Hartshorn that lives local and checks in often.  Ms. Morring shares her fear of returning home independently due to the multiple falls she has suffered. Discharge plan is for SNF placement at [facility] which she has been before and feels this will give her the opportunity to regain her strength.  Confirmed DNR status.  She remains hopeful that her condition will improve as she is eager to return  home and continue her work on [employer].  We discussed outpatient palliative care services for which she is agreeable as she recalls working with PMT during her service at [facility]. Will coordinate with TOC to set up at discharge.  No acute or ongoing palliative needs at this time. Anticipate d/c to SNF Monday or Tuesday.  Attempted to call niece-[name], unable to connect, VM left.  All questions/concerns addressed. Emotional support provided to patient/family/support persons. PMT will continue to follow and support patient as needed.  Objective: Primary Diagnoses: Present on Admission:  Syncope  Orthostatic hypotension  Subdural hematoma  Alcohol use  CKD (chronic kidney disease), stage III  Hypokalemia  Alkaline phosphatase elevation   Physical Exam Constitutional:      General: She is not in acute distress.    Appearance: She is ill-appearing.  Cardiovascular:     Rate and Rhythm: Rhythm irregular.  Pulmonary:     Effort: Pulmonary effort is normal. No respiratory distress.  Skin:    General: Skin is warm and dry.     Findings: Bruising present.  Neurological:     Mental Status: She is alert.     Motor: Weakness present.     Vital Signs: BP (!) 145/100   Pulse 97   Temp (!) 97.5 F (36.4 C) (Oral)   Resp 18   Ht  (1.702 m)   Wt 52.2 kg   SpO2 98%   BMI 18.02 kg/m  Pain Scale: 0-10   Pain Score: 8    IO: Intake/output summary:  Intake/Output Summary (Last 24 hours) at 03/27/2023 1051 Last data filed at 03/27/2023 0409 Gross per 24 hour  Intake 100 ml  Output 725 ml  Net -625 ml    LBM: Last BM Date : 03/26/23 Baseline Weight: Weight: 49.7 kg Most recent weight: Weight: 52.2 kg       Assessment and Plan  SUMMARY OF RECOMMENDATIONS   DNR Continue current plan of care TOC order placed to set up outpatient palliative services PMT to follow peripherally but will remain available as needs arise  Thank you for this consult and allowing Palliative Medicine to participate in the care of  Donna Evans. [place]. Palliative medicine will continue to follow and assist as needed.   Time Total: 55 minutes  Time spent includes: Detailed review of medical records (labs, imaging, vital signs), medically appropriate exam (mental status, respiratory, cardiac, skin), discussed with treatment team, counseling and educating patient, family and staff, documenting clinical information, medication management and coordination of care.   Signed by: Leeanne Deed, DNP, AGNP-C Palliative Medicine    Please contact Palliative Medicine Team phone at [phone] for questions and concerns.  For individual provider: See Loretha Stapler

## 2023-03-27 NOTE — Progress Notes (Signed)
Physical Therapy Treatment Patient Details Name: Donna Evans MRN: [id number] DOB: 02/11/1946 Today's Date: 03/27/2023   History of Present Illness Donna Evans is an 77 y.o. female seen in the emergency room today brought by EMS for a fall.  She has a small amount of intracranial hemorrhage, includes subdural and subarachnoid components.  MD assessment also includes: L2 compression fracture, multiple rib fractures, and syncope. PMH includes: TIA, A-fib, hypertension, and GERD.   Pt agreed to session despite being fatigued from poor sleep last night.  Bed mobility with contact guard.  She is able to sit unsupported.  Stood x 1 to RW with multiple rocking attempts and min a x 1 .  Once standing min a x 1 for general support and post lean on bed.  She does state she needs to sit due to "feeling funny" and upon questioning stated she can feel her irregular heart rate and that makes her anxious.  Denies pain.  She is given time to rest and then asks to use BSC.  Assisted with min a x 1.  Returned to supine with needs met.  Will monitor orthostatics tomorrow in session if appropriate.  They were taken this AM with nursing staff.  With 40 pt systolic drop to 657/84.     process, led by the attending physician.  Recommendations may be updated based on patient status, additional functional criteria and insurance authorization.  Follow Up Recommendations       Assistance Recommended at Discharge Intermittent Supervision/Assistance  Patient can return home with the following A little help with walking and/or transfers;A little help with bathing/dressing/bathroom;Assistance with cooking/housework;Assist for transportation;Help with stairs or ramp for entrance   Equipment Recommendations       Recommendations for Other Services       Precautions / Restrictions Precautions Precautions: Fall Precaution Comments: Watch orthostatics and HR Restrictions Weight Bearing Restrictions: No      Mobility  Bed Mobility Overal bed mobility: Needs Assistance Bed Mobility: Supine to Sit, Sit to Supine     Supine to sit: Min guard Sit to supine: Min guard        Transfers Overall transfer level: Needs assistance Equipment used: Rolling walker (2 wheels) Transfers: Sit to/from Stand, Bed to chair/wheelchair/BSC Sit to Stand: Min assist   Step pivot transfers: Min assist       General transfer comment: Min verbal cues for hand placement    Ambulation/Gait               General Gait Details: deferred due to "Feeling funny"   Stairs             Wheelchair Mobility    Modified Rankin (Stroke Patients Only)       Balance Overall balance assessment: Needs assistance Sitting-balance support: Feet supported, No upper extremity supported Sitting balance-Leahy Scale: Good     Standing balance support: During functional activity, Bilateral upper extremity supported, Reliant on assistive device for balance Standing balance-Leahy Scale: Poor Standing balance comment: relies on writer for transfer to [facility] and walker/post leg support on bed to stand                            Cognition Arousal/Alertness: Awake/alert Behavior During Therapy: WFL for tasks assessed/performed Overall Cognitive Status: Within Functional Limits for tasks assessed  Exercises Other Exercises Other Exercises: to [facility] to void    General Comments        Pertinent Vitals/Pain Pain Assessment Pain Assessment: Faces Faces Pain Scale: Hurts even more Pain Location: R side/ribs when twisting for self care on BSC Pain Descriptors / Indicators: Aching, Sore Pain Intervention(s): Limited activity within patient's tolerance, Monitored during session, Repositioned    Home Living                          Prior Function            PT Goals (current goals can now be found in the care plan section)  Progress towards PT goals: Progressing toward goals    Frequency    Min 3X/week      PT Plan Current plan remains appropriate    Co-evaluation              AM-PAC PT "6 Clicks" Mobility   Outcome Measure  Help needed turning from your back to your side while in a flat bed without using bedrails?: A Little Help needed moving from lying on your back to sitting on the side of a flat bed without using bedrails?: A Little Help needed moving to and from a bed to a chair (including a wheelchair)?: A Little Help needed standing up from a chair using your arms (e.g., wheelchair or bedside chair)?: A Little Help needed to walk in hospital room?: A Little Help needed climbing 3-5 steps with a railing? : A Lot 6 Click Score: 17    End of Session Equipment Utilized During Treatment: Gait belt Activity Tolerance: Treatment limited secondary to medical complications (Comment) Patient left: in bed;with call bell/phone within reach;with bed alarm set Nurse Communication: Mobility status PT Visit Diagnosis: Muscle weakness (generalized) (M62.81);History of falling (Z91.81)     Time: 9892-1194 PT Time Calculation (min) (ACUTE ONLY): 13 min  Charges:  $Therapeutic Activity: 8-22 mins                   Danielle Dess, PTA 03/27/23, 2:58 PM

## 2023-03-27 NOTE — Consult Note (Signed)
[facility]  CARDIOLOGY CONSULT NOTE  Patient ID: Donna Evans MRN: [id number] DOB/AGE: 03/29/1946 77 y.o.  Admit date: 03/23/2023 Referring Physician [place] Primary Physician Tejan-Sie Primary Cardiologist Hudson Majkowski Reason for Consultation atrial fibrillation  HPI: The patient is a 77 year old female referred for evaluation of chronic atrial fibrillation.  The patient was in her usual state of health until 03/23/2023 when she fell, her head, main MRI revealing acute subdural hematoma and scattered arachnoid hemorrhage.  Patient was evaluated by neurosurgery (Dr. Marcell Barlow) who felt that the patient has small amount of intracranial hemorrhage and recommending holding Eliquis.  Patient reports he was previously on Eliquis, was switched to warfarin, however, she experienced low blood pressure and stopped taking warfarin, confirmed by low INR of 1.4 on 03/23/2023.  During this hospitalization, the patient has remained in atrial fibrillation with variable rate.  The patient is on amiodarone and metoprolol tartrate for rate control.  Metoprolol tartrate was transiently held for low blood pressure, however started due to rapid ventricular rate.  Telemetry currently reveals atrial fibrillation at a rate of 100 bpm.  Review of systems complete and found to be negative unless listed above     Past Medical History:  Diagnosis Date   Allergic state    Anginal pain    Prinzmetal's angina   Anxiety    Arthritis    osteoarthritis   Breast cancer 1990   right breast cancer   Chronic pain    Chronic pain    COPD (chronic obstructive pulmonary disease)    Depression    Edema    Fibromyalgia    Foot fracture    Bilateral   GERD (gastroesophageal reflux disease)    History of kidney stones    Hypertension    Leg fracture, right    Low back pain    Lumbosacral neuritis    Medulloadrenal hyperfunction    Migraine headache    Peripheral neuropathy    Shoulder fracture, right    Stroke     TIA   Systemic lupus erythematosus    Thyroid disease    TIA (transient ischemic attack) 11/28/2013   Transient global amnesia 2011    Past Surgical History:  Procedure Laterality Date   ABDOMINAL HYSTERECTOMY  1981   APPENDECTOMY  1957   AUGMENTATION MAMMAPLASTY Bilateral 1990   saline submuscular   BACK SURGERY  1989   BILATERAL TOTAL MASTECTOMY WITH AXILLARY LYMPH NODE DISSECTION  1990   BREAST IMPLANT EXCHANGE Bilateral 05/11/2016   Procedure: REMOVAL AND REPLACEMENT OF BREAST IMPLANTS ;  Surgeon: Glenna Fellows, MD;  Location: [facility];  Service: Plastics;  Laterality: Bilateral;   CAPSULECTOMY Bilateral 05/11/2016   Procedure: CAPSULECTOMY CAPSULORRAPHY ;  Surgeon: Glenna Fellows, MD;  Location: [facility];  Service: Plastics;  Laterality: Bilateral;   CATARACT EXTRACTION W/PHACO Right 12/26/2018   Procedure: CATARACT EXTRACTION PHACO AND INTRAOCULAR LENS PLACEMENT (IOC) RIGHT;  Surgeon: Galen Manila, MD;  Location: [facility];  Service: Ophthalmology;  Laterality: Right;  [place]  00:46 CDE 8.50 Fluid pack lot # [id number]   CATARACT EXTRACTION W/PHACO Left 01/23/2019   Procedure: CATARACT EXTRACTION PHACO AND INTRAOCULAR LENS PLACEMENT (IOC) LEFT;  Surgeon: Galen Manila, MD;  Location: [facility];  Service: Ophthalmology;  Laterality: Left;  [place]  00:45 CDE  8.41 fluid pack lot # [id number]   CHOLECYSTECTOMY  1979   COLONOSCOPY WITH PROPOFOL N/A 01/25/2018   Procedure: COLONOSCOPY WITH PROPOFOL;  Surgeon: Scot Jun, MD;  Location: [facility];  Service: Endoscopy;  Laterality: N/A;   COPD     ESOPHAGOGASTRODUODENOSCOPY (EGD) WITH PROPOFOL N/A 03/07/2018   Procedure: ESOPHAGOGASTRODUODENOSCOPY (EGD) WITH PROPOFOL;  Surgeon: Scot Jun, MD;  Location: [facility];  Service: Endoscopy;  Laterality: N/A;   FRACTURE SURGERY     HARDWARE REMOVAL Left 06/18/2020   Procedure: HARDWARE REMOVAL;  Surgeon: Lyndle Herrlich, MD;  Location: [facility]  [facility];  Service: Orthopedics;  Laterality: Left;   HUMERUS IM NAIL Left 12/31/2019   Procedure: INTRAMEDULLARY (IM) NAIL HUMERAL;  Surgeon: Lyndle Herrlich, MD;  Location: [facility];  Service: Orthopedics;  Laterality: Left;   LIPOSUCTION Right 05/11/2016   Procedure: LIPOSUCTION;  Surgeon: Glenna Fellows, MD;  Location: [facility];  Service: Plastics;  Laterality: Right;   LIVER BIOPSY  2011   MASTECTOMY SUBCUTANEOUS Bilateral 1990   MASTOPEXY Bilateral 05/11/2016   Procedure: MASTOPEXY BILATERAL ;  Surgeon: Glenna Fellows, MD;  Location:  SURGERY CENTER;  Service: Plastics;  Laterality: Bilateral;    Medications Prior to Admission  Medication Sig Dispense Refill Last Dose   acetaminophen (TYLENOL) 500 MG tablet Take 1,000 mg by mouth every 6 (six) hours as needed.   03/22/2023   amiodarone (PACERONE) 200 MG tablet Take 1 tablet (200 mg total) by mouth 2 (two) times daily for 12 days, THEN 1 tablet (200 mg total) daily. (Patient taking differently: Take 1 tablet daily)   03/22/2023   busPIRone (BUSPAR) 5 MG tablet Take 1 tablet (5 mg total) by mouth 2 (two) times daily. 60 tablet 2 03/22/2023   calcium carbonate (TUMS - DOSED IN MG ELEMENTAL CALCIUM) 500 MG chewable tablet Chew 1 tablet by mouth 3 (three) times daily as needed for indigestion or heartburn.   unk at unk   cyclobenzaprine (FLEXERIL) 10 MG tablet Take 10 mg by mouth 3 (three) times daily.   03/22/2023   feeding supplement (ENSURE ENLIVE / ENSURE PLUS) LIQD Take 237 mLs by mouth 2 (two) times daily between meals. 237 mL 12 Past Week   folic acid (FOLVITE) 1 MG tablet Take 1 tablet (1 mg total) by mouth daily. 30 tablet 0 03/22/2023   metoprolol tartrate (LOPRESSOR) 25 MG tablet Take 1 tablet (25 mg total) by mouth 2 (two) times daily.   03/22/2023   midodrine (PROAMATINE) 2.5 MG tablet Take 1 tablet (2.5 mg total) by mouth 3 (three) times daily with meals. (Patient taking differently: Take 2.5 mg by mouth daily.)    03/22/2023   rosuvastatin (CRESTOR) 10 MG tablet Take 10 mg by mouth at bedtime.   03/22/2023   Suvorexant (BELSOMRA) 10 MG TABS Take 1 tablet (10 mg total) by mouth daily. 30 tablet 2 03/22/2023   torsemide (DEMADEX) 20 MG tablet Take 20 mg by mouth daily as needed.   unk at unk   Vitamin D, Ergocalciferol, 50000 units CAPS Take 1 capsule by mouth once a week.   Past Week   alum & mag hydroxide-simeth (MAALOX/MYLANTA) 200-200-20 MG/5ML suspension Take 15 mLs by mouth every 6 (six) hours as needed for indigestion or heartburn. (Patient not taking: Reported on 02/07/2023)   Not Taking   apixaban (ELIQUIS) 5 MG TABS tablet Take 5 mg by mouth 2 (two) times daily. (Patient not taking: Reported on 03/23/2023)   Not Taking   lidocaine (LIDODERM) 5 % Place 1 patch onto the skin daily. Remove & Discard patch within 12 hours or as directed by MD. Apply to back. (Patient not taking: Reported on 03/23/2023)  0 Not Taking   Social History   Socioeconomic History   Marital status: Widowed    Spouse name: Not on file   Number of children: 1   Years of education: college3   Highest education level: Not on file  Occupational History   Occupation: Retired  Tobacco Use   Smoking status: Former    Packs/day: 1.00    Years: 40.00    Additional pack years: 0.00    Total pack years: 40.00    Types: Cigarettes    Quit date: 08/28/2005    Years since quitting: 17.5   Smokeless tobacco: Never  Vaping Use   Vaping Use: Never used  Substance and Sexual Activity   Alcohol use: Not Currently   Drug use: No   Sexual activity: Never  Other Topics Concern   Not on file  Social History Narrative   Not on file   Social Determinants of Health   Financial Resource Strain: Not on file  Food Insecurity: No Food Insecurity (03/24/2023)   Hunger Vital Sign    Worried About Running Out of Food in the Last Year: Never true    Ran Out of Food in the Last Year: Never true  Transportation Needs: No Transportation Needs  (03/24/2023)   PRAPARE - Administrator, Civil Service (Medical): No    Lack of Transportation (Non-Medical): No  Physical Activity: Not on file  Stress: Not on file  Social Connections: Not on file  Intimate Partner Violence: Not At Risk (03/24/2023)   Humiliation, Afraid, Rape, and Kick questionnaire    Fear of Current or Ex-Partner: No    Emotionally Abused: No    Physically Abused: No    Sexually Abused: No    Family History  Problem Relation Age of Onset   Atrial fibrillation Mother    Atrial fibrillation Sister    Cancer Sister    Diabetes Sister    Breast cancer Sister 44   Diabetes Father    Cancer Sister    Diabetes Sister    Atrial fibrillation Sister    Kidney disease Maternal Aunt       Review of systems complete and found to be negative unless listed above      PHYSICAL EXAM  General: Well developed, well nourished, in no acute distress HEENT:  Normocephalic and atramatic Neck:  No JVD.  Lungs: Clear bilaterally to auscultation and percussion. Heart: HRRR . Normal S1 and S2 without gallops or murmurs.  Abdomen: Bowel sounds are positive, abdomen soft and non-tender  Msk:  Back normal, normal gait. Normal strength and tone for age. Extremities: No clubbing, cyanosis or edema.   Neuro: Alert and oriented X 3. Psych:  Good affect, responds appropriately  Labs:   Lab Results  Component Value Date   WBC 6.6 03/25/2023   HGB 11.3 (L) 03/25/2023   HCT 33.6 (L) 03/25/2023   MCV 92.8 03/25/2023   PLT 211 03/25/2023    Recent Labs  Lab 03/26/23 0638  NA 135  K 3.9  CL 103  CO2 23  BUN 18  CREATININE 0.97  CALCIUM 8.2*  PROT 6.5  BILITOT 1.0  ALKPHOS 537*  ALT 44  AST 79*  GLUCOSE 91   Lab Results  Component Value Date   CKTOTAL 73 03/23/2023   CKMB 2.2 02/29/2012   TROPONINI <0.03 07/21/2018    Lab Results  Component Value Date   CHOL 111 04/29/2022   CHOL 203 (A) 04/28/2015   Lab  Results  Component Value Date   HDL 42  04/29/2022   HDL 59 04/28/2015   Lab Results  Component Value Date   LDLCALC 46 04/29/2022   LDLCALC 116 04/28/2015   Lab Results  Component Value Date   TRIG 114 04/29/2022   TRIG 141 04/28/2015   Lab Results  Component Value Date   CHOLHDL 2.6 04/29/2022   No results found for: "LDLDIRECT"    Radiology: US Abdomen Limited RUQ (LIVER/GB)  Result Date: 03/26/2023 CLINICAL DATA:  Elevated liver enzymes, history of cholecystectomy EXAM: ULTRASOUND ABDOMEN LIMITED RIGHT UPPER QUADRANT COMPARISON:  CT chest abdomen and pelvis November 09, 2022 FINDINGS: Gallbladder: The gallbladder is surgically absent. No sonographic Murphy sign noted by sonographer. Common bile duct: Diameter: 5 mm Liver: No focal lesion identified. Within normal limits in parenchymal echogenicity. Portal vein is patent on color Doppler imaging with normal direction of blood flow towards the liver. Other: None. IMPRESSION: Normal right upper quadrant ultrasound. Electronically Signed   By: Jacob Moores M.D.   On: 03/26/2023 20:09   DG Lumbar Spine 2-3 Views  Result Date: 03/25/2023 CLINICAL DATA:  Back pain with fall a few weeks ago. EXAM: LUMBAR SPINE - 2-3 VIEW COMPARISON:  Prior lumbar spine films on 08/09/2022. CT of the chest on 11/09/2022 FINDINGS: There is an osteopenic compression fracture of the L2 vertebral body involving the superior endplate with approximately 50-60% loss of vertebral body height anteriorly. No evidence of significant retropulsion. This fracture was new between the plain film in August, 2023 and CT of 11/09/2022. At the time of CT, loss of vertebral body height was less severe and more central, only approaching 20-25%. Other vertebral body heights are intact. No evidence significant degenerative disc disease. Stable atherosclerosis of the abdominal aorta. No bony lesions identified. IMPRESSION: Progression of osteopenic compression fracture of the L2 vertebral body since prior CT of the chest  last November. There now is approximately 50-60% loss of vertebral body height anteriorly. No other lumbar compression fractures identified. Electronically Signed   By: Irish Lack M.D.   On: 03/25/2023 11:43   DG Thoracic Spine 2 View  Result Date: 03/25/2023 CLINICAL DATA:  Back pain with fall a few weeks ago. EXAM: THORACIC SPINE 2 VIEWS COMPARISON:  CT of the chest on 11/09/2022 FINDINGS: No acute fracture or subluxation. Mild degenerative disc disease present. No bony lesions or destruction. IMPRESSION: Mild degenerative disc disease of the thoracic spine. Electronically Signed   By: Irish Lack M.D.   On: 03/25/2023 11:40   EEG adult  Result Date: 03/24/2023 Charlsie Quest, MD     03/24/2023  4:37 PM Patient Name: Donna Evans MRN: [id number] Epilepsy Attending:Priyanka Annabelle Harman Referring Physician/Provider: Alford Highland, MD Date: 03/24/2023 Duration: 30.22 mins Patient history: 77yo F with left subdural hematoma and subarachnoid hemorrhage. EEG to evaluate for seizure Level of alertness: Awake AEDs during EEG study: None Technical aspects: This EEG study was done with scalp electrodes positioned according to the 10-20 International system of electrode placement. Electrical activity was reviewed with band pass filter of 1-70Hz , sensitivity of 7 uV/mm, display speed of 67mm/sec with a  notched filter applied as appropriate. EEG data were recorded continuously and digitally stored.  Video monitoring was available and reviewed as appropriate. Description: The posterior dominant rhythm consists of 9-10 Hz activity of moderate voltage (25-35 uV) seen predominantly in posterior head regions, symmetric and reactive to eye opening and eye closing. Hyperventilation and photic stimulation were not performed.  IMPRESSION: This study is within normal limits. No seizures or epileptiform discharges were seen throughout the recording. A normal interictal EEG does not exclude the diagnosis of  epilepsy. Charlsie Quest   MR BRAIN WO CONTRAST  Result Date: 03/24/2023 CLINICAL DATA:  Initial evaluation for syncope. EXAM: MRI HEAD WITHOUT CONTRAST TECHNIQUE: Multiplanar, multiecho pulse sequences of the brain and surrounding structures were obtained without intravenous contrast. COMPARISON:  Prior CTs from 03/23/2023. FINDINGS: Brain: Generalized age-related cerebral atrophy. Patchy T2/FLAIR hyperintensity involving the periventricular and deep white matter both cerebral hemispheres, consistent with chronic small vessel ischemic disease, moderate in nature. Acute subdural hematoma overlying the left cerebral convexity again seen. This measures up to 3 mm in maximal thickness at the level of the left parietal lobe. No significant mass effect or midline shift. Additional scattered posttraumatic subarachnoid hemorrhage at the peripheral left temporal lobe. Few small superimposed hemorrhagic contusions present within this region, largest of which measures approximately 1.5 cm. Additional hemorrhagic contusion present at the contralateral right temporal lobe. This measures up to approximately 1.7 cm. No significant mass effect. Additionally, there is scattered small volume subarachnoid hemorrhage at the peripheral left cerebellum at the level of the CP angle cistern. Trace subarachnoid noted within the interpeduncular cistern. Overall, these findings are relatively similar to prior CT. No evidence for significant interval bleeding since prior. No evidence for acute or subacute ischemia. Gray-white matter differentiation otherwise maintained. No mass lesion or hydrocephalus. Pituitary gland and suprasellar region within normal limits. Vascular: Major intracranial vascular flow voids are maintained. Skull and upper cervical spine: Craniocervical junction within normal limits. Bone marrow signal intensity normal. Evolving right-sided scalp contusion noted. Sinuses/Orbits: Globes and orbital soft tissues  demonstrate no acute finding. Prior bilateral ocular lens replacement. Small volume layering secretions noted within the right sphenoid sinus. Paranasal sinuses are otherwise largely clear. Trace bilateral mastoid effusions, of doubtful significance. Other: None. IMPRESSION: 1. Acute subdural hematoma overlying the left cerebral convexity, measuring up to 3 mm in maximal thickness. No significant mass effect or midline shift. 2. Scattered posttraumatic subarachnoid hemorrhage involving the peripheral left temporal lobe and left cerebellum. Few small superimposed hemorrhagic contusions about the left greater than right temporal lobes as above. Overall, these findings are relatively similar as compared to prior CT from 03/23/2023. 3. Underlying age-related cerebral atrophy with moderate chronic small vessel ischemic disease. No other acute intracranial abnormality. Electronically Signed   By: Rise Mu M.D.   On: 03/24/2023 02:26   CT Head Wo Contrast  Result Date: 03/23/2023 CLINICAL DATA:  Fall, head bleed EXAM: CT HEAD WITHOUT CONTRAST TECHNIQUE: Contiguous axial images were obtained from the base of the skull through the vertex without intravenous contrast. RADIATION DOSE REDUCTION: This exam was performed according to the departmental dose-optimization program which includes automated exposure control, adjustment of the mA and/or kV according to patient size and/or use of iterative reconstruction technique. COMPARISON:  Earlier today FINDINGS: Brain: Subarachnoid hemorrhage again seen in the left temporal region. The previously seen 2 mm left anterior para falcine subdural hematoma and left temporal subdural hematoma again noted measuring 3 mm, unchanged. Focus of intracerebral hemorrhage now seen in the anterior right temporal lobe, new since prior study. This measures 1.2 cm. No mass effect or midline shift. No hydrocephalus. Hematoma Vascular: No hyperdense vessel or unexpected calcification.  Skull: No acute calvarial abnormality. Sinuses/Orbits: No acute findings Other: None IMPRESSION: Stable left temporal subdural and subarachnoid hemorrhages. Stable left parafalcine subdural hematoma. New area of hemorrhage in  the anterior right temporal lobe, 1.2 cm. Electronically Signed   By: Charlett Nose M.D.   On: 03/23/2023 22:34   CT Head Wo Contrast  Result Date: 03/23/2023 CLINICAL DATA:  77 year old female with fall and head and neck injury. EXAM: CT HEAD WITHOUT CONTRAST CT CERVICAL SPINE WITHOUT CONTRAST TECHNIQUE: Multidetector CT imaging of the head and cervical spine was performed following the standard protocol without intravenous contrast. Multiplanar CT image reconstructions of the cervical spine were also generated. RADIATION DOSE REDUCTION: This exam was performed according to the departmental dose-optimization program which includes automated exposure control, adjustment of the mA and/or kV according to patient size and/or use of iterative reconstruction technique. COMPARISON:  02/01/2023 prior studies FINDINGS: CT HEAD FINDINGS Brain: A 2 mm LEFT anterior parafalcine subdural hematoma and a 3 mm anterior LEFT temporal subdural hematoma identified. LEFT temporal subarachnoid hemorrhage is also noted. Equivocal tiny amount of RIGHT sylvian fissure subarachnoid hemorrhage noted. There is no evidence of mass effect or midline shift. No infarct or hydrocephalus noted. Atrophy and chronic small-vessel white matter ischemic changes again noted. Vascular: Carotid atherosclerotic calcifications are noted. Skull: No acute abnormality Sinuses/Orbits: A very small amount of fluid in the RIGHT sphenoid sinus noted. No other acute abnormalities identified. Other: None CT CERVICAL SPINE FINDINGS Alignment: Unremarkable.  No acute subluxation. Skull base and vertebrae: No acute fracture. No primary bone lesion or focal pathologic process. Soft tissues and spinal canal: No prevertebral fluid or swelling. No  visible canal hematoma. Disc levels: Multilevel degenerative disc disease and facet arthropathy are again noted contributing to multilevel central spinal and bony foraminal narrowing. Upper chest: No acute abnormality. Emphysema and biapical pleuroparenchymal scarring again noted. Other: None IMPRESSION: 1. 2 mm LEFT anterior parafalcine subdural hematoma and 3 mm anterior LEFT temporal subdural hematoma. LEFT temporal subarachnoid hemorrhage and possibly RIGHT sylvian fissure subarachnoid hemorrhage. No evidence of mass effect or midline shift. 2. Atrophy and chronic small-vessel white matter ischemic changes. 3. No evidence of acute injury to the cervical spine. Multilevel degenerative changes within the cervical spine contributing to multilevel central spinal and bony foraminal narrowing. Critical Value/emergent results were called by telephone at the time of interpretation on 03/23/2023 at 4:08 pm to provider [facility] , who verbally acknowledged these results. Electronically Signed   By: Harmon Pier M.D.   On: 03/23/2023 16:10   CT Cervical Spine Wo Contrast  Result Date: 03/23/2023 CLINICAL DATA:  77 year old female with fall and head and neck injury. EXAM: CT HEAD WITHOUT CONTRAST CT CERVICAL SPINE WITHOUT CONTRAST TECHNIQUE: Multidetector CT imaging of the head and cervical spine was performed following the standard protocol without intravenous contrast. Multiplanar CT image reconstructions of the cervical spine were also generated. RADIATION DOSE REDUCTION: This exam was performed according to the departmental dose-optimization program which includes automated exposure control, adjustment of the mA and/or kV according to patient size and/or use of iterative reconstruction technique. COMPARISON:  02/01/2023 prior studies FINDINGS: CT HEAD FINDINGS Brain: A 2 mm LEFT anterior parafalcine subdural hematoma and a 3 mm anterior LEFT temporal subdural hematoma identified. LEFT temporal subarachnoid  hemorrhage is also noted. Equivocal tiny amount of RIGHT sylvian fissure subarachnoid hemorrhage noted. There is no evidence of mass effect or midline shift. No infarct or hydrocephalus noted. Atrophy and chronic small-vessel white matter ischemic changes again noted. Vascular: Carotid atherosclerotic calcifications are noted. Skull: No acute abnormality Sinuses/Orbits: A very small amount of fluid in the RIGHT sphenoid sinus noted. No other acute abnormalities identified.  Other: None CT CERVICAL SPINE FINDINGS Alignment: Unremarkable.  No acute subluxation. Skull base and vertebrae: No acute fracture. No primary bone lesion or focal pathologic process. Soft tissues and spinal canal: No prevertebral fluid or swelling. No visible canal hematoma. Disc levels: Multilevel degenerative disc disease and facet arthropathy are again noted contributing to multilevel central spinal and bony foraminal narrowing. Upper chest: No acute abnormality. Emphysema and biapical pleuroparenchymal scarring again noted. Other: None IMPRESSION: 1. 2 mm LEFT anterior parafalcine subdural hematoma and 3 mm anterior LEFT temporal subdural hematoma. LEFT temporal subarachnoid hemorrhage and possibly RIGHT sylvian fissure subarachnoid hemorrhage. No evidence of mass effect or midline shift. 2. Atrophy and chronic small-vessel white matter ischemic changes. 3. No evidence of acute injury to the cervical spine. Multilevel degenerative changes within the cervical spine contributing to multilevel central spinal and bony foraminal narrowing. Critical Value/emergent results were called by telephone at the time of interpretation on 03/23/2023 at 4:08 pm to provider [facility] , who verbally acknowledged these results. Electronically Signed   By: Harmon Pier M.D.   On: 03/23/2023 16:10   DG Ribs Unilateral W/Chest Right  Result Date: 03/23/2023 CLINICAL DATA:  Fall with right-sided pain. EXAM: RIGHT RIBS AND CHEST - 3+ VIEW COMPARISON:  Chest  x-ray 02/01/2023.  CT of the chest 11/09/2022 FINDINGS: Again seen are healed posterior right eighth and ninth rib fractures similar to prior. Chronic posterior left eighth and ninth rib fractures are also unchanged. No new acute fractures are seen. There is no focal lung infiltrate, pleural effusion or pneumothorax. Chronic appearing deformity of the left humeral head appears similar to prior. IMPRESSION: 1. No acute fracture. 2. Chronic bilateral rib fractures. Electronically Signed   By: Darliss Cheney M.D.   On: 03/23/2023 16:08    EKG: Atrial fibrillation 140 bpm with underlying RBBB 03/26/2023  ASSESSMENT AND PLAN:   1.  Atrial fibrillation with rapid ventricular rate, CHA2DS2-VASc score of 6, minimally symptomatic, on amiodarone and metoprolol to tartrate for rate control 2.  Subdural hematoma/subarachnoid hemorrhage, with small amount of intracranial hemorrhage, conservative management 3.  Essential hypertension 4.  History of transient ischemic attack  Recommendations  1.  Agree with current therapy 2.  Continue amiodarone and metoprolol to tartrate for rate control 3.  Resume chronic anticoagulation and 2 to 3 weeks as outpatient  Signed: Marcina Millard MD,PhD, [facility] 03/27/2023, 9:25 AM

## 2023-03-28 DIAGNOSIS — L03115 Cellulitis of right lower limb: Secondary | ICD-10-CM | POA: Diagnosis not present

## 2023-03-28 DIAGNOSIS — I609 Nontraumatic subarachnoid hemorrhage, unspecified: Secondary | ICD-10-CM | POA: Diagnosis not present

## 2023-03-28 DIAGNOSIS — I480 Paroxysmal atrial fibrillation: Secondary | ICD-10-CM | POA: Diagnosis not present

## 2023-03-28 DIAGNOSIS — S065XAA Traumatic subdural hemorrhage with loss of consciousness status unknown, initial encounter: Secondary | ICD-10-CM | POA: Diagnosis not present

## 2023-03-28 MED ORDER — CEPHALEXIN 500 MG PO CAPS
500.0000 mg | ORAL_CAPSULE | Freq: Three times a day (TID) | ORAL | Status: DC
  Administered 2023-03-28 – 2023-03-29 (×3): 500 mg via ORAL
  Filled 2023-03-28 (×3): qty 1

## 2023-03-28 NOTE — Progress Notes (Signed)
[facility]  CARDIOLOGY CONSULT NOTE  Patient ID: Donna Evans MRN: [id number] DOB/AGE: July 29, 1946 77 y.o.  Admit date: 03/23/2023 Referring Physician Wieting Primary Physician Tejan-Sie Primary Cardiologist Paraschos Reason for Consultation atrial fibrillation  HPI: The patient is a 77 year old female referred for evaluation of chronic atrial fibrillation.  The patient was in her usual state of health until 03/23/2023 when she fell, her head, main MRI revealing acute subdural hematoma and scattered arachnoid hemorrhage.  Patient was evaluated by neurosurgery (Dr. Marcell Barlow) who felt that the patient has small amount of intracranial hemorrhage and recommending holding Eliquis.  Patient reports he was previously on Eliquis, was switched to warfarin, however, she experienced low blood pressure and stopped taking warfarin, confirmed by low INR of 1.4 on 03/23/2023.  During this hospitalization, the patient has remained in atrial fibrillation with variable rate.  The patient is on amiodarone and metoprolol tartrate for rate control.  Metoprolol tartrate was transiently held for low blood pressure, however started due to rapid ventricular rate.    Interval History:  - no acute events - in AF with rate 90s-low 100s on tele - patient reports continued discomfort on right flank from falling. No chest pain, shortness of breath.   Review of systems complete and found to be negative unless listed above     Past Medical History:  Diagnosis Date   Allergic state    Anginal pain    Prinzmetal's angina   Anxiety    Arthritis    osteoarthritis   Breast cancer 1990   right breast cancer   Chronic pain    Chronic pain    COPD (chronic obstructive pulmonary disease)    Depression    Edema    Fibromyalgia    Foot fracture    Bilateral   GERD (gastroesophageal reflux disease)    History of kidney stones    Hypertension    Leg fracture, right    Low back pain    Lumbosacral neuritis     Medulloadrenal hyperfunction    Migraine headache    Peripheral neuropathy    Shoulder fracture, right    Stroke    TIA   Systemic lupus erythematosus    Thyroid disease    TIA (transient ischemic attack) 11/28/2013   Transient global amnesia 2011    Past Surgical History:  Procedure Laterality Date   ABDOMINAL HYSTERECTOMY  1981   APPENDECTOMY  1957   AUGMENTATION MAMMAPLASTY Bilateral 1990   saline submuscular   BACK SURGERY  1989   BILATERAL TOTAL MASTECTOMY WITH AXILLARY LYMPH NODE DISSECTION  1990   BREAST IMPLANT EXCHANGE Bilateral 05/11/2016   Procedure: REMOVAL AND REPLACEMENT OF BREAST IMPLANTS ;  Surgeon: Glenna Fellows, MD;  Location: [facility];  Service: Plastics;  Laterality: Bilateral;   CAPSULECTOMY Bilateral 05/11/2016   Procedure: CAPSULECTOMY CAPSULORRAPHY ;  Surgeon: Glenna Fellows, MD;  Location: [facility];  Service: Plastics;  Laterality: Bilateral;   CATARACT EXTRACTION W/PHACO Right 12/26/2018   Procedure: CATARACT EXTRACTION PHACO AND INTRAOCULAR LENS PLACEMENT (IOC) RIGHT;  Surgeon: Galen Manila, MD;  Location: [facility];  Service: Ophthalmology;  Laterality: Right;  [place]  00:46 CDE 8.50 Fluid pack lot # [id number]   CATARACT EXTRACTION W/PHACO Left 01/23/2019   Procedure: CATARACT EXTRACTION PHACO AND INTRAOCULAR LENS PLACEMENT (IOC) LEFT;  Surgeon: Galen Manila, MD;  Location: [facility];  Service: Ophthalmology;  Laterality: Left;  [place]  00:45 CDE  8.41 fluid pack lot # [id number]   CHOLECYSTECTOMY  1979   COLONOSCOPY WITH PROPOFOL N/A 01/25/2018   Procedure: COLONOSCOPY WITH PROPOFOL;  Surgeon: Scot Jun, MD;  Location: [facility];  Service: Endoscopy;  Laterality: N/A;   COPD     ESOPHAGOGASTRODUODENOSCOPY (EGD) WITH PROPOFOL N/A 03/07/2018   Procedure: ESOPHAGOGASTRODUODENOSCOPY (EGD) WITH PROPOFOL;  Surgeon: Scot Jun, MD;  Location: [facility];  Service: Endoscopy;  Laterality: N/A;   FRACTURE  SURGERY     HARDWARE REMOVAL Left 06/18/2020   Procedure: HARDWARE REMOVAL;  Surgeon: Lyndle Herrlich, MD;  Location: [facility];  Service: Orthopedics;  Laterality: Left;   HUMERUS IM NAIL Left 12/31/2019   Procedure: INTRAMEDULLARY (IM) NAIL HUMERAL;  Surgeon: Lyndle Herrlich, MD;  Location: [facility];  Service: Orthopedics;  Laterality: Left;   LIPOSUCTION Right 05/11/2016   Procedure: LIPOSUCTION;  Surgeon: Glenna Fellows, MD;  Location: [facility];  Service: Plastics;  Laterality: Right;   LIVER BIOPSY  2011   MASTECTOMY SUBCUTANEOUS Bilateral 1990   MASTOPEXY Bilateral 05/11/2016   Procedure: MASTOPEXY BILATERAL ;  Surgeon: Glenna Fellows, MD;  Location: [facility];  Service: Plastics;  Laterality: Bilateral;    Medications Prior to Admission  Medication Sig Dispense Refill Last Dose   acetaminophen (TYLENOL) 500 MG tablet Take 1,000 mg by mouth every 6 (six) hours as needed.   03/22/2023   amiodarone (PACERONE) 200 MG tablet Take 1 tablet (200 mg total) by mouth 2 (two) times daily for 12 days, THEN 1 tablet (200 mg total) daily. (Patient taking differently: Take 1 tablet daily)   03/22/2023   busPIRone (BUSPAR) 5 MG tablet Take 1 tablet (5 mg total) by mouth 2 (two) times daily. 60 tablet 2 03/22/2023   calcium carbonate (TUMS - DOSED IN MG ELEMENTAL CALCIUM) 500 MG chewable tablet Chew 1 tablet by mouth 3 (three) times daily as needed for indigestion or heartburn.   unk at unk   cyclobenzaprine (FLEXERIL) 10 MG tablet Take 10 mg by mouth 3 (three) times daily.   03/22/2023   feeding supplement (ENSURE ENLIVE / ENSURE PLUS) LIQD Take 237 mLs by mouth 2 (two) times daily between meals. 237 mL 12 Past Week   folic acid (FOLVITE) 1 MG tablet Take 1 tablet (1 mg total) by mouth daily. 30 tablet 0 03/22/2023   metoprolol tartrate (LOPRESSOR) 25 MG tablet Take 1 tablet (25 mg total) by mouth 2 (two) times daily.   03/22/2023   midodrine (PROAMATINE) 2.5 MG tablet Take 1 tablet  (2.5 mg total) by mouth 3 (three) times daily with meals. (Patient taking differently: Take 2.5 mg by mouth daily.)   03/22/2023   rosuvastatin (CRESTOR) 10 MG tablet Take 10 mg by mouth at bedtime.   03/22/2023   Suvorexant (BELSOMRA) 10 MG TABS Take 1 tablet (10 mg total) by mouth daily. 30 tablet 2 03/22/2023   torsemide (DEMADEX) 20 MG tablet Take 20 mg by mouth daily as needed.   unk at unk   Vitamin D, Ergocalciferol, 50000 units CAPS Take 1 capsule by mouth once a week.   Past Week   alum & mag hydroxide-simeth (MAALOX/MYLANTA) 200-200-20 MG/5ML suspension Take 15 mLs by mouth every 6 (six) hours as needed for indigestion or heartburn. (Patient not taking: Reported on 02/07/2023)   Not Taking   apixaban (ELIQUIS) 5 MG TABS tablet Take 5 mg by mouth 2 (two) times daily. (Patient not taking: Reported on 03/23/2023)   Not Taking   lidocaine (LIDODERM) 5 % Place 1 patch onto  the skin daily. Remove & Discard patch within 12 hours or as directed by MD. Apply to back. (Patient not taking: Reported on 03/23/2023)  0 Not Taking   Social History   Socioeconomic History   Marital status: Widowed    Spouse name: Not on file   Number of children: 1   Years of education: college3   Highest education level: Not on file  Occupational History   Occupation: Retired  Tobacco Use   Smoking status: Former    Packs/day: 1.00    Years: 40.00    Additional pack years: 0.00    Total pack years: 40.00    Types: Cigarettes    Quit date: 08/28/2005    Years since quitting: 17.5   Smokeless tobacco: Never  Vaping Use   Vaping Use: Never used  Substance and Sexual Activity   Alcohol use: Not Currently   Drug use: No   Sexual activity: Never  Other Topics Concern   Not on file  Social History Narrative   Not on file   Social Determinants of Health   Financial Resource Strain: Not on file  Food Insecurity: No Food Insecurity (03/24/2023)   Hunger Vital Sign    Worried About Running Out of Food in the Last  Year: Never true    Ran Out of Food in the Last Year: Never true  Transportation Needs: No Transportation Needs (03/24/2023)   PRAPARE - Administrator, Civil Service (Medical): No    Lack of Transportation (Non-Medical): No  Physical Activity: Not on file  Stress: Not on file  Social Connections: Not on file  Intimate Partner Violence: Not At Risk (03/24/2023)   Humiliation, Afraid, Rape, and Kick questionnaire    Fear of Current or Ex-Partner: No    Emotionally Abused: No    Physically Abused: No    Sexually Abused: No    Family History  Problem Relation Age of Onset   Atrial fibrillation Mother    Atrial fibrillation Sister    Cancer Sister    Diabetes Sister    Breast cancer Sister 101   Diabetes Father    Cancer Sister    Diabetes Sister    Atrial fibrillation Sister    Kidney disease Maternal Aunt      PHYSICAL EXAM  General:  elderly caucasian female, in no acute distress. Laying at angle in PCU bed HEENT:  Normocephalic and atramatic Neck:  No JVD.  Lungs: normal respiratory effort on room air, decreased breath sounds without appreciable crackles or wheezing Heart: irregularly irregular with controlled rate . Normal S1 and S2 without gallops, 2/6 systolic murmur heard at the LUSB Abdomen: thin and non distended appearing.  Msk:  Normal strength and tone for age. Extremities: No clubbing, cyanosis or edema.   Neuro: Alert and oriented X 3. Psych:  anxious mood, answers questions appropriately.   Labs:   Lab Results  Component Value Date   WBC 6.6 03/25/2023   HGB 11.3 (L) 03/25/2023   HCT 33.6 (L) 03/25/2023   MCV 92.8 03/25/2023   PLT 211 03/25/2023    Recent Labs  Lab 03/26/23 0638  NA 135  K 3.9  CL 103  CO2 23  BUN 18  CREATININE 0.97  CALCIUM 8.2*  PROT 6.5  BILITOT 1.0  ALKPHOS 537*  ALT 44  AST 79*  GLUCOSE 91    Lab Results  Component Value Date   CKTOTAL 73 03/23/2023   CKMB 2.2 02/29/2012   TROPONINI <  0.03 07/21/2018     Lab Results  Component Value Date   CHOL 111 04/29/2022   CHOL 203 (A) 04/28/2015   Lab Results  Component Value Date   HDL 42 04/29/2022   HDL 59 04/28/2015   Lab Results  Component Value Date   LDLCALC 46 04/29/2022   LDLCALC 116 04/28/2015   Lab Results  Component Value Date   TRIG 114 04/29/2022   TRIG 141 04/28/2015   Lab Results  Component Value Date   CHOLHDL 2.6 04/29/2022   No results found for: "LDLDIRECT"    Radiology: US Abdomen Limited RUQ (LIVER/GB)  Result Date: 03/26/2023 CLINICAL DATA:  Elevated liver enzymes, history of cholecystectomy EXAM: ULTRASOUND ABDOMEN LIMITED RIGHT UPPER QUADRANT COMPARISON:  CT chest abdomen and pelvis November 09, 2022 FINDINGS: Gallbladder: The gallbladder is surgically absent. No sonographic Murphy sign noted by sonographer. Common bile duct: Diameter: 5 mm Liver: No focal lesion identified. Within normal limits in parenchymal echogenicity. Portal vein is patent on color Doppler imaging with normal direction of blood flow towards the liver. Other: None. IMPRESSION: Normal right upper quadrant ultrasound. Electronically Signed   By: Jacob Moores M.D.   On: 03/26/2023 20:09   DG Lumbar Spine 2-3 Views  Result Date: 03/25/2023 CLINICAL DATA:  Back pain with fall a few weeks ago. EXAM: LUMBAR SPINE - 2-3 VIEW COMPARISON:  Prior lumbar spine films on 08/09/2022. CT of the chest on 11/09/2022 FINDINGS: There is an osteopenic compression fracture of the L2 vertebral body involving the superior endplate with approximately 50-60% loss of vertebral body height anteriorly. No evidence of significant retropulsion. This fracture was new between the plain film in August, 2023 and CT of 11/09/2022. At the time of CT, loss of vertebral body height was less severe and more central, only approaching 20-25%. Other vertebral body heights are intact. No evidence significant degenerative disc disease. Stable atherosclerosis of the abdominal aorta. No  bony lesions identified. IMPRESSION: Progression of osteopenic compression fracture of the L2 vertebral body since prior CT of the chest last November. There now is approximately 50-60% loss of vertebral body height anteriorly. No other lumbar compression fractures identified. Electronically Signed   By: Irish Lack M.D.   On: 03/25/2023 11:43   DG Thoracic Spine 2 View  Result Date: 03/25/2023 CLINICAL DATA:  Back pain with fall a few weeks ago. EXAM: THORACIC SPINE 2 VIEWS COMPARISON:  CT of the chest on 11/09/2022 FINDINGS: No acute fracture or subluxation. Mild degenerative disc disease present. No bony lesions or destruction. IMPRESSION: Mild degenerative disc disease of the thoracic spine. Electronically Signed   By: Irish Lack M.D.   On: 03/25/2023 11:40   EEG adult  Result Date: 03/24/2023 Charlsie Quest, MD     03/24/2023  4:37 PM Patient Name: Donna Evans MRN: [id number] Epilepsy Attending:Priyanka Annabelle Harman Referring Physician/Provider: Alford Highland, MD Date: 03/24/2023 Duration: 30.22 mins Patient history: 77yo F with left subdural hematoma and subarachnoid hemorrhage. EEG to evaluate for seizure Level of alertness: Awake AEDs during EEG study: None Technical aspects: This EEG study was done with scalp electrodes positioned according to the 10-20 International system of electrode placement. Electrical activity was reviewed with band pass filter of 1-70Hz , sensitivity of 7 uV/mm, display speed of 65mm/sec with a 60Hz  notched filter applied as appropriate. EEG data were recorded continuously and digitally stored.  Video monitoring was available and reviewed as appropriate. Description: The posterior dominant rhythm consists of 9-10 Hz activity of moderate  voltage (25-35 uV) seen predominantly in posterior head regions, symmetric and reactive to eye opening and eye closing. Hyperventilation and photic stimulation were not performed.   IMPRESSION: This study is within normal limits. No  seizures or epileptiform discharges were seen throughout the recording. A normal interictal EEG does not exclude the diagnosis of epilepsy. Charlsie Quest   MR BRAIN WO CONTRAST  Result Date: 03/24/2023 CLINICAL DATA:  Initial evaluation for syncope. EXAM: MRI HEAD WITHOUT CONTRAST TECHNIQUE: Multiplanar, multiecho pulse sequences of the brain and surrounding structures were obtained without intravenous contrast. COMPARISON:  Prior CTs from 03/23/2023. FINDINGS: Brain: Generalized age-related cerebral atrophy. Patchy T2/FLAIR hyperintensity involving the periventricular and deep white matter both cerebral hemispheres, consistent with chronic small vessel ischemic disease, moderate in nature. Acute subdural hematoma overlying the left cerebral convexity again seen. This measures up to 3 mm in maximal thickness at the level of the left parietal lobe. No significant mass effect or midline shift. Additional scattered posttraumatic subarachnoid hemorrhage at the peripheral left temporal lobe. Few small superimposed hemorrhagic contusions present within this region, largest of which measures approximately 1.5 cm. Additional hemorrhagic contusion present at the contralateral right temporal lobe. This measures up to approximately 1.7 cm. No significant mass effect. Additionally, there is scattered small volume subarachnoid hemorrhage at the peripheral left cerebellum at the level of the CP angle cistern. Trace subarachnoid noted within the interpeduncular cistern. Overall, these findings are relatively similar to prior CT. No evidence for significant interval bleeding since prior. No evidence for acute or subacute ischemia. Gray-white matter differentiation otherwise maintained. No mass lesion or hydrocephalus. Pituitary gland and suprasellar region within normal limits. Vascular: Major intracranial vascular flow voids are maintained. Skull and upper cervical spine: Craniocervical junction within normal limits. Bone  marrow signal intensity normal. Evolving right-sided scalp contusion noted. Sinuses/Orbits: Globes and orbital soft tissues demonstrate no acute finding. Prior bilateral ocular lens replacement. Small volume layering secretions noted within the right sphenoid sinus. Paranasal sinuses are otherwise largely clear. Trace bilateral mastoid effusions, of doubtful significance. Other: None. IMPRESSION: 1. Acute subdural hematoma overlying the left cerebral convexity, measuring up to 3 mm in maximal thickness. No significant mass effect or midline shift. 2. Scattered posttraumatic subarachnoid hemorrhage involving the peripheral left temporal lobe and left cerebellum. Few small superimposed hemorrhagic contusions about the left greater than right temporal lobes as above. Overall, these findings are relatively similar as compared to prior CT from 03/23/2023. 3. Underlying age-related cerebral atrophy with moderate chronic small vessel ischemic disease. No other acute intracranial abnormality. Electronically Signed   By: Rise Mu M.D.   On: 03/24/2023 02:26   CT Head Wo Contrast  Result Date: 03/23/2023 CLINICAL DATA:  Fall, head bleed EXAM: CT HEAD WITHOUT CONTRAST TECHNIQUE: Contiguous axial images were obtained from the base of the skull through the vertex without intravenous contrast. RADIATION DOSE REDUCTION: This exam was performed according to the departmental dose-optimization program which includes automated exposure control, adjustment of the mA and/or kV according to patient size and/or use of iterative reconstruction technique. COMPARISON:  Earlier today FINDINGS: Brain: Subarachnoid hemorrhage again seen in the left temporal region. The previously seen 2 mm left anterior para falcine subdural hematoma and left temporal subdural hematoma again noted measuring 3 mm, unchanged. Focus of intracerebral hemorrhage now seen in the anterior right temporal lobe, new since prior study. This measures 1.2  cm. No mass effect or midline shift. No hydrocephalus. Hematoma Vascular: No hyperdense vessel or unexpected calcification. Skull: No  acute calvarial abnormality. Sinuses/Orbits: No acute findings Other: None IMPRESSION: Stable left temporal subdural and subarachnoid hemorrhages. Stable left parafalcine subdural hematoma. New area of hemorrhage in the anterior right temporal lobe, 1.2 cm. Electronically Signed   By: Charlett Nose M.D.   On: 03/23/2023 22:34   CT Head Wo Contrast  Result Date: 03/23/2023 CLINICAL DATA:  77 year old female with fall and head and neck injury. EXAM: CT HEAD WITHOUT CONTRAST CT CERVICAL SPINE WITHOUT CONTRAST TECHNIQUE: Multidetector CT imaging of the head and cervical spine was performed following the standard protocol without intravenous contrast. Multiplanar CT image reconstructions of the cervical spine were also generated. RADIATION DOSE REDUCTION: This exam was performed according to the departmental dose-optimization program which includes automated exposure control, adjustment of the mA and/or kV according to patient size and/or use of iterative reconstruction technique. COMPARISON:  02/01/2023 prior studies FINDINGS: CT HEAD FINDINGS Brain: A 2 mm LEFT anterior parafalcine subdural hematoma and a 3 mm anterior LEFT temporal subdural hematoma identified. LEFT temporal subarachnoid hemorrhage is also noted. Equivocal tiny amount of RIGHT sylvian fissure subarachnoid hemorrhage noted. There is no evidence of mass effect or midline shift. No infarct or hydrocephalus noted. Atrophy and chronic small-vessel white matter ischemic changes again noted. Vascular: Carotid atherosclerotic calcifications are noted. Skull: No acute abnormality Sinuses/Orbits: A very small amount of fluid in the RIGHT sphenoid sinus noted. No other acute abnormalities identified. Other: None CT CERVICAL SPINE FINDINGS Alignment: Unremarkable.  No acute subluxation. Skull base and vertebrae: No acute  fracture. No primary bone lesion or focal pathologic process. Soft tissues and spinal canal: No prevertebral fluid or swelling. No visible canal hematoma. Disc levels: Multilevel degenerative disc disease and facet arthropathy are again noted contributing to multilevel central spinal and bony foraminal narrowing. Upper chest: No acute abnormality. Emphysema and biapical pleuroparenchymal scarring again noted. Other: None IMPRESSION: 1. 2 mm LEFT anterior parafalcine subdural hematoma and 3 mm anterior LEFT temporal subdural hematoma. LEFT temporal subarachnoid hemorrhage and possibly RIGHT sylvian fissure subarachnoid hemorrhage. No evidence of mass effect or midline shift. 2. Atrophy and chronic small-vessel white matter ischemic changes. 3. No evidence of acute injury to the cervical spine. Multilevel degenerative changes within the cervical spine contributing to multilevel central spinal and bony foraminal narrowing. Critical Value/emergent results were called by telephone at the time of interpretation on 03/23/2023 at 4:08 pm to provider [facility] , who verbally acknowledged these results. Electronically Signed   By: Harmon Pier M.D.   On: 03/23/2023 16:10   CT Cervical Spine Wo Contrast  Result Date: 03/23/2023 CLINICAL DATA:  77 year old female with fall and head and neck injury. EXAM: CT HEAD WITHOUT CONTRAST CT CERVICAL SPINE WITHOUT CONTRAST TECHNIQUE: Multidetector CT imaging of the head and cervical spine was performed following the standard protocol without intravenous contrast. Multiplanar CT image reconstructions of the cervical spine were also generated. RADIATION DOSE REDUCTION: This exam was performed according to the departmental dose-optimization program which includes automated exposure control, adjustment of the mA and/or kV according to patient size and/or use of iterative reconstruction technique. COMPARISON:  02/01/2023 prior studies FINDINGS: CT HEAD FINDINGS Brain: A 2 mm LEFT  anterior parafalcine subdural hematoma and a 3 mm anterior LEFT temporal subdural hematoma identified. LEFT temporal subarachnoid hemorrhage is also noted. Equivocal tiny amount of RIGHT sylvian fissure subarachnoid hemorrhage noted. There is no evidence of mass effect or midline shift. No infarct or hydrocephalus noted. Atrophy and chronic small-vessel white matter ischemic changes again noted. Vascular:  Carotid atherosclerotic calcifications are noted. Skull: No acute abnormality Sinuses/Orbits: A very small amount of fluid in the RIGHT sphenoid sinus noted. No other acute abnormalities identified. Other: None CT CERVICAL SPINE FINDINGS Alignment: Unremarkable.  No acute subluxation. Skull base and vertebrae: No acute fracture. No primary bone lesion or focal pathologic process. Soft tissues and spinal canal: No prevertebral fluid or swelling. No visible canal hematoma. Disc levels: Multilevel degenerative disc disease and facet arthropathy are again noted contributing to multilevel central spinal and bony foraminal narrowing. Upper chest: No acute abnormality. Emphysema and biapical pleuroparenchymal scarring again noted. Other: None IMPRESSION: 1. 2 mm LEFT anterior parafalcine subdural hematoma and 3 mm anterior LEFT temporal subdural hematoma. LEFT temporal subarachnoid hemorrhage and possibly RIGHT sylvian fissure subarachnoid hemorrhage. No evidence of mass effect or midline shift. 2. Atrophy and chronic small-vessel white matter ischemic changes. 3. No evidence of acute injury to the cervical spine. Multilevel degenerative changes within the cervical spine contributing to multilevel central spinal and bony foraminal narrowing. Critical Value/emergent results were called by telephone at the time of interpretation on 03/23/2023 at 4:08 pm to provider [facility] , who verbally acknowledged these results. Electronically Signed   By: Harmon Pier M.D.   On: 03/23/2023 16:10   DG Ribs Unilateral W/Chest  Right  Result Date: 03/23/2023 CLINICAL DATA:  Fall with right-sided pain. EXAM: RIGHT RIBS AND CHEST - 3+ VIEW COMPARISON:  Chest x-ray 02/01/2023.  CT of the chest 11/09/2022 FINDINGS: Again seen are healed posterior right eighth and ninth rib fractures similar to prior. Chronic posterior left eighth and ninth rib fractures are also unchanged. No new acute fractures are seen. There is no focal lung infiltrate, pleural effusion or pneumothorax. Chronic appearing deformity of the left humeral head appears similar to prior. IMPRESSION: 1. No acute fracture. 2. Chronic bilateral rib fractures. Electronically Signed   By: Darliss Cheney M.D.   On: 03/23/2023 16:08    EKG: Atrial fibrillation 140 bpm with underlying RBBB 03/26/2023  Tele: AF 90-105bpm   ASSESSMENT AND PLAN:   1.  Atrial fibrillation with rapid ventricular rate, CHA2DS2-VASc score of 6, minimally symptomatic, on amiodarone and metoprolol tartrate for rate control 2.  Subdural hematoma/subarachnoid hemorrhage, with small amount of intracranial hemorrhage, conservative management 3.  Essential hypertension 4.  History of transient ischemic attack  Recommendations  1.  Agree with current therapy 2.  Continue amiodarone 100mg  daily and metoprolol tartrate 25mg  BID for rate control 3.  Resume chronic anticoagulation and 2 to 3 weeks as outpatient 4.  No additional cardiac diagnostics. Ok for discharge from a cardiac perspective today. Will arrange for follow up with Dr. Darrold Junker in 1-2 weeks.   This patient's plan of care was discussed and created with Dr. Juliann Pares and he is in agreement.    Signed: Rebeca Allegra, PA-C  03/28/2023, 8:15 AM

## 2023-03-28 NOTE — Progress Notes (Signed)
Physical Therapy Treatment Patient Details Name: Donna Evans MRN: [id number] DOB: 1946-06-04 Today's Date: 03/28/2023   History of Present Illness Donna Evans is an 77 y.o. female seen in the emergency room today brought by EMS for a fall.  She has a small amount of intracranial hemorrhage, includes subdural and subarachnoid components.  MD assessment also includes: L2 compression fracture, multiple rib fractures, and syncope. PMH includes: TIA, A-fib, hypertension, and GERD.    PT Comments    Pt sitting on BSC upon PT arrival; pt agreeable to therapy.  Pt min assist stand step turn BSC to bed.  Pt's BP in sitting 101/68 (MAP 77) with HR 97 bpm; pt's BP in standing initially 111/68 (MAP 79) with HR 105 bpm; and after 1 1/2 minutes of standing (pt unable to maintain standing d/t lightheadedness and feeling like her heart was racing so these vitals taken immediately once sitting) BP 116/103 (MAP 110) with HR 108 bpm.  Pt reporting feeling off and appearing anxious so deferred further activity (pt assisted back to bed with all needs in reach; pt's nurse updated on pt's status and above vitals).  Will continue to focus on strengthening, balance, and progressive functional mobility per pt tolerance.    Recommendations for follow up therapy are one component of a multi-disciplinary discharge planning process, led by the attending physician.  Recommendations may be updated based on patient status, additional functional criteria and insurance authorization.  Follow Up Recommendations  Can patient physically be transported by private vehicle: Yes    Assistance Recommended at Discharge Intermittent Supervision/Assistance  Patient can return home with the following A little help with walking and/or transfers;A little help with bathing/dressing/bathroom;Assistance with cooking/housework;Assist for transportation;Help with stairs or ramp for entrance   Equipment Recommendations  Rolling walker (2  wheels);BSC/3in1    Recommendations for Other Services       Precautions / Restrictions Precautions Precautions: Fall Precaution Comments: Watch orthostatics and HR Restrictions Weight Bearing Restrictions: No     Mobility  Bed Mobility Overal bed mobility: Needs Assistance Bed Mobility: Sit to Supine       Sit to supine: Supervision   General bed mobility comments: vc's for technique    Transfers Overall transfer level: Needs assistance Equipment used: Rolling walker (2 wheels) Transfers: Sit to/from Stand, Bed to chair/wheelchair/BSC Sit to Stand: Min assist   Step pivot transfers: Min assist       General transfer comment: min assist stand step turn BSC to bed; min assist to stand from bed    Ambulation/Gait               General Gait Details: deferred d/t feeling "off"   Stairs             Wheelchair Mobility    Modified Rankin (Stroke Patients Only)       Balance Overall balance assessment: Needs assistance Sitting-balance support: No upper extremity supported, Feet supported Sitting balance-Leahy Scale: Good Sitting balance - Comments: steady sitting reaching within BOS   Standing balance support: Bilateral upper extremity supported, Reliant on assistive device for balance Standing balance-Leahy Scale: Fair Standing balance comment: steady static standing with B UE support on RW                            Cognition Arousal/Alertness: Awake/alert Behavior During Therapy: Anxious Overall Cognitive Status: Within Functional Limits for tasks assessed  Exercises      General Comments  Nursing cleared pt for participation in physical therapy.  Pt agreeable to PT session.      Pertinent Vitals/Pain Pain Assessment Pain Assessment: Faces Faces Pain Scale: Hurts little more Pain Location: R side of face and R side/ribs (from prior fall--nurse notified and  aware) Pain Descriptors / Indicators: Aching, Sore Pain Intervention(s): Limited activity within patient's tolerance, Monitored during session, Premedicated before session, Repositioned O2 sats WFL on room air during session.    Home Living                          Prior Function            PT Goals (current goals can now be found in the care plan section) Acute Rehab PT Goals Patient Stated Goal: Improve her strength PT Goal Formulation: With patient Time For Goal Achievement: 04/07/23 Potential to Achieve Goals: Good Progress towards PT goals: Progressing toward goals    Frequency    Min 3X/week      PT Plan Current plan remains appropriate    Co-evaluation              AM-PAC PT "6 Clicks" Mobility   Outcome Measure  Help needed turning from your back to your side while in a flat bed without using bedrails?: A Little Help needed moving from lying on your back to sitting on the side of a flat bed without using bedrails?: A Little Help needed moving to and from a bed to a chair (including a wheelchair)?: A Little Help needed standing up from a chair using your arms (e.g., wheelchair or bedside chair)?: A Little Help needed to walk in hospital room?: A Little Help needed climbing 3-5 steps with a railing? : A Lot 6 Click Score: 17    End of Session Equipment Utilized During Treatment: Gait belt Activity Tolerance: Other (comment) (limited d/t feeling "off") Patient left: in bed;with call bell/phone within reach;with bed alarm set Nurse Communication: Mobility status;Precautions;Other (comment) (pt's pain and symptoms/feeling off) PT Visit Diagnosis: Muscle weakness (generalized) (M62.81);History of falling (Z91.81)     Time: 1340-1403 PT Time Calculation (min) (ACUTE ONLY): 23 min  Charges:  $Therapeutic Exercise: 8-22 mins $Therapeutic Activity: 8-22 mins                     Hendricks Limes, PT 03/28/23, 2:21 PM

## 2023-03-28 NOTE — Care Management Important Message (Signed)
Important Message  Patient Details  Name: Donna Evans MRN: [id number] Date of Birth: 19-Mar-1946   Medicare Important Message Given:  Yes     [name] 03/28/2023, 12:06 PM

## 2023-03-28 NOTE — TOC Progression Note (Signed)
Transition of Care [facility]) - Progression Note    Patient Details  Name: RAEANN MELANSON MRN: [id number] Date of Birth: [birth year]  [facility]) CM/SW Contact  Truddie Hidden, RN Phone Number: 03/28/2023, 10:46 AM  Clinical Narrative:    Per Elmarie Shiley at [employer]. Patient can admit today if auth received.    Expected Discharge Plan: Skilled Nursing Facility Barriers to Discharge: Continued Medical Work up  Expected Discharge Plan and Services     Post Acute Care Choice: Skilled Nursing Facility Living arrangements for the past 2 months: Single Family Home                                       Social Determinants of Health (SDOH) Interventions SDOH Screenings   Food Insecurity: No Food Insecurity (03/24/2023)  Housing: Low Risk  (03/24/2023)  Transportation Needs: No Transportation Needs (03/24/2023)  Utilities: Not At Risk (03/24/2023)  Depression (PHQ2-9): High Risk (12/27/2022)  Tobacco Use: Medium Risk (03/23/2023)    Readmission Risk Interventions    03/24/2023    2:47 PM  Readmission Risk Prevention Plan  Transportation Screening Complete  Medication Review (RN Care Manager) Complete  PCP or Specialist appointment within 3-5 days of discharge Complete  HRI or Home Care Consult Complete  SW Recovery Care/Counseling Consult Complete  Palliative Care Screening Not Applicable  Skilled Nursing Facility Complete

## 2023-03-28 NOTE — Telephone Encounter (Signed)
Patient is still admitted.

## 2023-03-28 NOTE — Progress Notes (Signed)
Progress Note   Patient: Donna Evans:[id number] DOB: 27-Feb-1946 DOA: 03/23/2023     4 DOS: the patient was seen and examined on 03/28/2023   Brief hospital course: 77 year old female that lives alone.  Brought in with altered mental status and frequent falls.  Past medical history of TIA, A-fib, hypertension, GERD.  Patient complains of right-sided pain.  She has been off of blood thinners for a while.  Patient found to have subdural hematoma and subarachnoid hemorrhage on CT scan and MRI.  Bridget Hartshorn concerned that she may have had a seizure.  He has witnessed 1 fall in the past where she was staring blank and then started shaking and fell backwards.  EEG negative.  4/12.  Patient still complaining of a lot of pain.  Seen by neurology and gabapentin started at night.  Physical therapy recommending rehab.  Patient having some abdominal discomfort.  Told nursing staff about back discomfort. 4/13.  Patient complains of pains all over.  Pointed out redness on her right foot, so I started antibiotics for cellulitis.  Patient went into rapid atrial fibrillation and needed to restart metoprolol. 4/14.  Patient complains of not sleeping.  Trazodone ordered. 4/15.  Patient stable to go out to rehab.  Currently waiting for insurance authorization.     Assessment and Plan: * Subdural hematoma Appreciate neurosurgery consultation.  Okay to work with physical therapy and Occupational Therapy.  No blood thinners for 2 weeks.  Follow-up as outpatient for repeat CT scan in 1 month as per neurosurgery.  Subarachnoid hemorrhage See above plan.    Cellulitis of right foot Change ancef to keflex  Paroxysmal atrial fibrillation Continue metoprolol and amiodarone.  No blood thinners secondary to subarachnoid hemorrhage.  Syncope Neurology started gabapentin at night.   Compression fracture of L2 Progression from the last imaging.  Pain control.  Lidoderm patch.  Multiple rib fractures Chronic in  nature  Hypophosphatemia Replaced  Acute urinary retention Resolved  Hypomagnesemia Replaced  Protein-calorie malnutrition, severe Continue supplements  Alcohol use Completed high-dose thiamine will give oral thiamine now.  Hypokalemia Replaced  Orthostatic hypotension metoprolol yesterday with fast heart rate.  Continue to monitor orthostatic vital signs  CKD (chronic kidney disease), stage III CKD stage IIIa.  Creatinine improved to 0.97 with a GFR greater than 60  Alkaline phosphatase elevation GGT elevated.  Right upper quadrant ultrasound negative.  Still could be Paget's disease of the bone.        Subjective: Patient still has a lot of pain but able to sit up without any issues.  Admitted after a fall found to have subdural and subarachnoid hemorrhage.  Physical Exam: Vitals:   03/28/23 0500 03/28/23 0831 03/28/23 1217 03/28/23 1606  BP:  (!) 147/87 (!) 145/80 (!) 126/54  Pulse:  (!) 101 100 62  Resp:  Temp:  98.5 F (36.9 C)    TempSrc:  Oral    SpO2:  94% 96% 95%  Weight: 51.5 kg     Height:       Physical Exam HENT:     Head: Normocephalic.     Mouth/Throat:     Pharynx: No oropharyngeal exudate.  Eyes:     General: Lids are normal.     Conjunctiva/sclera: Conjunctivae normal.  Cardiovascular:     Rate and Rhythm: Normal rate. Rhythm irregularly irregular.     Heart sounds: S1 normal and S2 normal. Murmur heard.     Systolic murmur is present with a  grade of 2/6.  Pulmonary:     Breath sounds: Examination of the right-lower field reveals decreased breath sounds. Examination of the left-lower field reveals decreased breath sounds. Decreased breath sounds present. No wheezing, rhonchi or rales.  Abdominal:     Palpations: Abdomen is soft.     Tenderness: There is no abdominal tenderness.  Musculoskeletal:     Right lower leg: No swelling.     Left lower leg: No swelling.     Comments: No pain palpating over lumbar spine.  Skin:     General: Skin is warm.     Comments: Redness on the top of the right foot.  Neurological:     Mental Status: She is alert.     Comments: Patient moves all extremities.     Data Reviewed: No new data today  Family Communication: Spoke with [name] on the phone  Disposition: Status is: Inpatient Remains inpatient appropriate because: Medically stable to go.  I thought insurance authorization was started on Saturday but had to be restarted today.  Planned Discharge Destination: Rehab    Time spent: 28 minutes  Author: Alford Highland, MD 03/28/2023 4:27 PM  For on call review [email or web address].

## 2023-03-28 NOTE — Progress Notes (Signed)
Occupational Therapy Treatment Patient Details Name: Donna Evans MRN: [id number] DOB: 07-26-1946 Today's Date: 03/28/2023   History of present illness Donna Evans is an 77 y.o. female seen in the emergency room today brought by EMS for a fall.  She has a small amount of intracranial hemorrhage, includes subdural and subarachnoid components.  MD assessment also includes: L2 compression fracture, multiple rib fractures, and syncope. PMH includes: TIA, A-fib, hypertension, and GERD.   OT comments  Upon entering the room, pt supine in bed and combing hair. Pt is agreeable to OT intervention this session and requesting to brush her teeth. Pt performs bed mobility with supervision to EOB and stands with min A and use of RW. Pt ambulates with RW to sink and stands with min guard to brush teeth. Pt declines toileting needs and then ambulates to the door and back ~ 30' with RW and 2 LOB requiring min-mod A to correct. Pt returning to bed at end of session with call bell and all needed items within reach. Bed alarm activated.    Recommendations for follow up therapy are one component of a multi-disciplinary discharge planning process, led by the attending physician.  Recommendations may be updated based on patient status, additional functional criteria and insurance authorization.    Assistance Recommended at Discharge Frequent or constant Supervision/Assistance  Patient can return home with the following  A little help with walking and/or transfers;A little help with bathing/dressing/bathroom;Help with stairs or ramp for entrance;Assistance with cooking/housework;Assist for transportation   Equipment Recommendations  BSC/3in1       Precautions / Restrictions Precautions Precautions: Fall Precaution Comments: Watch orthostatics and HR Restrictions Weight Bearing Restrictions: No       Mobility Bed Mobility Overal bed mobility: Needs Assistance Bed Mobility: Supine to Sit, Sit to Supine      Supine to sit: Supervision Sit to supine: Supervision        Transfers Overall transfer level: Needs assistance Equipment used: Rolling walker (2 wheels) Transfers: Sit to/from Stand Sit to Stand: Min assist                 Balance Overall balance assessment: Needs assistance Sitting-balance support: Feet supported, No upper extremity supported Sitting balance-Leahy Scale: Good     Standing balance support: During functional activity, Bilateral upper extremity supported, Reliant on assistive device for balance Standing balance-Leahy Scale: Poor Standing balance comment: LOB with mobility                           ADL either performed or assessed with clinical judgement   ADL Overall ADL's : Needs assistance/impaired     Grooming: Wash/dry hands;Wash/dry face;Oral care;Standing;Min guard                                      Extremity/Trunk Assessment Upper Extremity Assessment Upper Extremity Assessment: Overall WFL for tasks assessed   Lower Extremity Assessment Lower Extremity Assessment: Generalized weakness   Cervical / Trunk Assessment Cervical / Trunk Assessment: Normal    Vision Patient Visual Report: No change from baseline            Cognition Arousal/Alertness: Awake/alert Behavior During Therapy: WFL for tasks assessed/performed Overall Cognitive Status: Within Functional Limits for tasks assessed  Pertinent Vitals/ Pain       Pain Assessment Pain Assessment: No/denies pain         Frequency  Min 1X/week        Progress Toward Goals  OT Goals(current goals can now be found in the care plan section)  Progress towards OT goals: Progressing toward goals     Plan Discharge plan remains appropriate;Frequency needs to be updated       AM-PAC OT "6 Clicks" Daily Activity     Outcome Measure   Help from another person eating  meals?: None Help from another person taking care of personal grooming?: A Little Help from another person toileting, which includes using toliet, bedpan, or urinal?: A Little Help from another person bathing (including washing, rinsing, drying)?: A Little Help from another person to put on and taking off regular upper body clothing?: A Little Help from another person to put on and taking off regular lower body clothing?: A Little 6 Click Score: 19    End of Session Equipment Utilized During Treatment: Rolling walker (2 wheels)  OT Visit Diagnosis: Other abnormalities of gait and mobility (R26.89);Muscle weakness (generalized) (M62.81)   Activity Tolerance Patient tolerated treatment well   Patient Left with call bell/phone within reach;in bed;with bed alarm set   Nurse Communication Mobility status        Time: 1610-9604 OT Time Calculation (min): 15 min  Charges: OT General Charges $OT Visit: 1 Visit OT Treatments $Self Care/Home Management : 8-22 mins  Jackquline Denmark, MS, OTR/L , CBIS ascom [phone]  03/28/23, 12:23 PM

## 2023-03-28 NOTE — Consult Note (Signed)
   [facility] Inpatient Consult   03/28/2023  OVEE PENDLETON [birth year] [phone]    Location: [facility] RN Hospital Liaison screened remotely([facility]).   [facility] Customer service manager [facility]) Accountable Care Organization [ACO] Patient: Insurance ([facility])    Primary Care Provider:  Sherron Monday, MD [facility]  Patient screened for readmission hospitalization with noted Extreme high risk score for unplanned readmission risk with 4 IP/1 ED in 6 months. [facility]/Population Health RN liaison will assess for potential [facility]) Care Management service needs for post hospital transition for care coordination.   Plan: [facility]/Population Health RN Liaison will continue to follow ongoing disposition in assessing for post hospital community care coordination/management needs.  Referral request for community care coordination: pending disposition.   [facility] Care Management/Population Health does not replace or interfere with any arrangements made by the Inpatient Transition of Care team.   For questions contact:    Elliot Cousin, RN, BSN [facility] Liaison [place]   [facility]  Population Health Office Hours MTWF 8:00 am to 6 pm off on Thursday [phone] mobile [phone] [Office toll free line][facility] Office Hours are M-F 8:30 - 5 pm 24 hour nurse advise line [phone] Conceirge  [email or web address]

## 2023-03-29 ENCOUNTER — Inpatient Hospital Stay: Payer: PPO

## 2023-03-29 DIAGNOSIS — E876 Hypokalemia: Secondary | ICD-10-CM | POA: Diagnosis not present

## 2023-03-29 DIAGNOSIS — S32020S Wedge compression fracture of second lumbar vertebra, sequela: Secondary | ICD-10-CM | POA: Diagnosis not present

## 2023-03-29 DIAGNOSIS — I130 Hypertensive heart and chronic kidney disease with heart failure and stage 1 through stage 4 chronic kidney disease, or unspecified chronic kidney disease: Secondary | ICD-10-CM | POA: Diagnosis not present

## 2023-03-29 DIAGNOSIS — I5032 Chronic diastolic (congestive) heart failure: Secondary | ICD-10-CM | POA: Diagnosis not present

## 2023-03-29 DIAGNOSIS — S0003XA Contusion of scalp, initial encounter: Secondary | ICD-10-CM | POA: Diagnosis not present

## 2023-03-29 DIAGNOSIS — I480 Paroxysmal atrial fibrillation: Secondary | ICD-10-CM | POA: Diagnosis not present

## 2023-03-29 DIAGNOSIS — R748 Abnormal levels of other serum enzymes: Secondary | ICD-10-CM | POA: Diagnosis not present

## 2023-03-29 DIAGNOSIS — E7800 Pure hypercholesterolemia, unspecified: Secondary | ICD-10-CM | POA: Diagnosis not present

## 2023-03-29 DIAGNOSIS — K140 Glossitis: Secondary | ICD-10-CM | POA: Insufficient documentation

## 2023-03-29 DIAGNOSIS — E43 Unspecified severe protein-calorie malnutrition: Secondary | ICD-10-CM | POA: Diagnosis not present

## 2023-03-29 DIAGNOSIS — I609 Nontraumatic subarachnoid hemorrhage, unspecified: Secondary | ICD-10-CM | POA: Diagnosis not present

## 2023-03-29 DIAGNOSIS — M800AXD Age-related osteoporosis with current pathological fracture, other site, subsequent encounter for fracture with routine healing: Secondary | ICD-10-CM | POA: Diagnosis not present

## 2023-03-29 DIAGNOSIS — N1831 Chronic kidney disease, stage 3a: Secondary | ICD-10-CM | POA: Diagnosis not present

## 2023-03-29 DIAGNOSIS — G4700 Insomnia, unspecified: Secondary | ICD-10-CM | POA: Diagnosis not present

## 2023-03-29 DIAGNOSIS — G894 Chronic pain syndrome: Secondary | ICD-10-CM | POA: Diagnosis not present

## 2023-03-29 DIAGNOSIS — N1832 Chronic kidney disease, stage 3b: Secondary | ICD-10-CM | POA: Diagnosis not present

## 2023-03-29 DIAGNOSIS — S06360A Traumatic hemorrhage of cerebrum, unspecified, without loss of consciousness, initial encounter: Secondary | ICD-10-CM | POA: Diagnosis not present

## 2023-03-29 DIAGNOSIS — E559 Vitamin D deficiency, unspecified: Secondary | ICD-10-CM | POA: Diagnosis not present

## 2023-03-29 DIAGNOSIS — Z79899 Other long term (current) drug therapy: Secondary | ICD-10-CM | POA: Diagnosis not present

## 2023-03-29 DIAGNOSIS — W19XXXD Unspecified fall, subsequent encounter: Secondary | ICD-10-CM | POA: Diagnosis not present

## 2023-03-29 DIAGNOSIS — J309 Allergic rhinitis, unspecified: Secondary | ICD-10-CM | POA: Diagnosis not present

## 2023-03-29 DIAGNOSIS — R519 Headache, unspecified: Secondary | ICD-10-CM | POA: Diagnosis not present

## 2023-03-29 DIAGNOSIS — R338 Other retention of urine: Secondary | ICD-10-CM | POA: Diagnosis not present

## 2023-03-29 DIAGNOSIS — F411 Generalized anxiety disorder: Secondary | ICD-10-CM | POA: Diagnosis not present

## 2023-03-29 DIAGNOSIS — K219 Gastro-esophageal reflux disease without esophagitis: Secondary | ICD-10-CM | POA: Diagnosis not present

## 2023-03-29 DIAGNOSIS — S065XAA Traumatic subdural hemorrhage with loss of consciousness status unknown, initial encounter: Secondary | ICD-10-CM | POA: Diagnosis not present

## 2023-03-29 DIAGNOSIS — R600 Localized edema: Secondary | ICD-10-CM | POA: Diagnosis not present

## 2023-03-29 DIAGNOSIS — M797 Fibromyalgia: Secondary | ICD-10-CM | POA: Diagnosis not present

## 2023-03-29 DIAGNOSIS — I951 Orthostatic hypotension: Secondary | ICD-10-CM | POA: Diagnosis not present

## 2023-03-29 DIAGNOSIS — J449 Chronic obstructive pulmonary disease, unspecified: Secondary | ICD-10-CM | POA: Diagnosis not present

## 2023-03-29 DIAGNOSIS — L03115 Cellulitis of right lower limb: Secondary | ICD-10-CM | POA: Diagnosis not present

## 2023-03-29 DIAGNOSIS — R55 Syncope and collapse: Secondary | ICD-10-CM | POA: Diagnosis not present

## 2023-03-29 DIAGNOSIS — S065X9D Traumatic subdural hemorrhage with loss of consciousness of unspecified duration, subsequent encounter: Secondary | ICD-10-CM | POA: Diagnosis not present

## 2023-03-29 DIAGNOSIS — Z7901 Long term (current) use of anticoagulants: Secondary | ICD-10-CM | POA: Diagnosis not present

## 2023-03-29 DIAGNOSIS — Z853 Personal history of malignant neoplasm of breast: Secondary | ICD-10-CM | POA: Diagnosis not present

## 2023-03-29 DIAGNOSIS — Z87891 Personal history of nicotine dependence: Secondary | ICD-10-CM | POA: Diagnosis not present

## 2023-03-29 LAB — BASIC METABOLIC PANEL
Anion gap: 6 (ref 5–15)
BUN: 28 mg/dL — ABNORMAL HIGH (ref 8–23)
CO2: 30 mmol/L (ref 22–32)
Calcium: 8 mg/dL — ABNORMAL LOW (ref 8.9–10.3)
Chloride: 100 mmol/L (ref 98–111)
Creatinine, Ser: 1.18 mg/dL — ABNORMAL HIGH (ref 0.44–1.00)
GFR, Estimated: 48 mL/min — ABNORMAL LOW (ref >60)
Glucose, Bld: 96 mg/dL (ref 70–99)
Potassium: 4.2 mmol/L (ref 3.5–5.1)
Sodium: 136 mmol/L (ref 135–145)

## 2023-03-29 LAB — MAGNESIUM: Magnesium: 2.3 mg/dL (ref 1.7–2.4)

## 2023-03-29 LAB — PROTEIN ELECTROPHORESIS, SERUM
A/G Ratio: 0.9 (ref 0.7–1.7)
Albumin ELP: 2.8 g/dL — ABNORMAL LOW (ref 2.9–4.4)
Alpha-1-Globulin: 0.3 g/dL (ref 0.0–0.4)
Alpha-2-Globulin: 0.6 g/dL (ref 0.4–1.0)
Beta Globulin: 0.7 g/dL (ref 0.7–1.3)
Gamma Globulin: 1.6 g/dL (ref 0.4–1.8)
Globulin, Total: 3.1 g/dL (ref 2.2–3.9)
Total Protein ELP: 5.9 g/dL — ABNORMAL LOW (ref 6.0–8.5)

## 2023-03-29 LAB — HEMOGLOBIN: Hemoglobin: 11.3 g/dL — ABNORMAL LOW (ref 12.0–15.0)

## 2023-03-29 LAB — PHOSPHORUS: Phosphorus: 3.1 mg/dL (ref 2.5–4.6)

## 2023-03-29 MED ORDER — ADULT MULTIVITAMIN W/MINERALS CH: 1.0000 | ORAL_TABLET | Freq: Every day | ORAL | 0 refills | Status: DC

## 2023-03-29 MED ORDER — BENZOCAINE 20 % MT AERO
INHALATION_SPRAY | Freq: Three times a day (TID) | OROMUCOSAL | Status: DC
  Filled 2023-03-29: qty 57

## 2023-03-29 MED ORDER — PHENOL 1.4 % MT LIQD
1.0000 | Freq: Three times a day (TID) | OROMUCOSAL | Status: DC
  Administered 2023-03-29: 1 via OROMUCOSAL
  Filled 2023-03-29: qty 177

## 2023-03-29 MED ORDER — ASCORBIC ACID 500 MG PO TABS: 500.0000 mg | ORAL_TABLET | Freq: Every day | ORAL | 0 refills | Status: DC

## 2023-03-29 MED ORDER — DIPHENHYDRAMINE-ZINC ACETATE 2-0.1 % EX CREA: TOPICAL_CREAM | Freq: Three times a day (TID) | CUTANEOUS | 0 refills | Status: DC | PRN

## 2023-03-29 MED ORDER — GABAPENTIN 100 MG PO CAPS: ORAL_CAPSULE | ORAL | 0 refills | Status: DC

## 2023-03-29 MED ORDER — LORATADINE 10 MG PO TABS
10.0000 mg | ORAL_TABLET | Freq: Every day | ORAL | Status: DC
  Administered 2023-03-29: 10 mg via ORAL
  Filled 2023-03-29: qty 1

## 2023-03-29 MED ORDER — SODIUM CHLORIDE 0.9 % IV SOLN
6.2500 mg | Freq: Once | INTRAVENOUS | Status: AC
  Administered 2023-03-29: 6.25 mg via INTRAVENOUS
  Filled 2023-03-29: qty 0.25

## 2023-03-29 MED ORDER — MAGNESIUM SULFATE 2 GM/50ML IV SOLN
2.0000 g | Freq: Once | INTRAVENOUS | Status: AC
  Administered 2023-03-29: 2 g via INTRAVENOUS
  Filled 2023-03-29: qty 50

## 2023-03-29 MED ORDER — LIDOCAINE VISCOUS HCL 2 % MT SOLN
15.0000 mL | Freq: Three times a day (TID) | OROMUCOSAL | Status: DC
  Administered 2023-03-29: 15 mL via OROMUCOSAL
  Filled 2023-03-29 (×2): qty 15

## 2023-03-29 MED ORDER — METOPROLOL TARTRATE 25 MG PO TABS: 12.5000 mg | ORAL_TABLET | Freq: Two times a day (BID) | ORAL | 0 refills | Status: DC

## 2023-03-29 MED ORDER — PANTOPRAZOLE SODIUM 40 MG PO TBEC: 40.0000 mg | DELAYED_RELEASE_TABLET | Freq: Every day | ORAL | 0 refills | Status: DC

## 2023-03-29 MED ORDER — METOPROLOL TARTRATE 25 MG PO TABS: 12.5000 mg | ORAL_TABLET | Freq: Two times a day (BID) | ORAL | Status: DC

## 2023-03-29 MED ORDER — VITAMIN B-1 100 MG PO TABS: 100.0000 mg | ORAL_TABLET | Freq: Every day | ORAL | 0 refills | Status: DC

## 2023-03-29 MED ORDER — LORATADINE 10 MG PO TABS: 10.0000 mg | ORAL_TABLET | Freq: Every day | ORAL | 0 refills | Status: DC

## 2023-03-29 MED ORDER — TRAZODONE HCL 50 MG PO TABS: 25.0000 mg | ORAL_TABLET | Freq: Every evening | ORAL | 0 refills | Status: DC | PRN

## 2023-03-29 MED ORDER — HYDROCODONE-ACETAMINOPHEN 5-325 MG PO TABS: 1.0000 | ORAL_TABLET | Freq: Three times a day (TID) | ORAL | 0 refills | Status: DC | PRN

## 2023-03-29 MED ORDER — ACETAMINOPHEN 500 MG PO TABS
1000.0000 mg | ORAL_TABLET | Freq: Once | ORAL | Status: AC
  Administered 2023-03-29: 1000 mg via ORAL
  Filled 2023-03-29: qty 2

## 2023-03-29 MED ORDER — LIDOCAINE VISCOUS HCL 2 % MT SOLN: 15.0000 mL | Freq: Three times a day (TID) | OROMUCOSAL | 0 refills | Status: DC

## 2023-03-29 MED ORDER — CEPHALEXIN 500 MG PO CAPS: 500.0000 mg | ORAL_CAPSULE | Freq: Three times a day (TID) | ORAL | 0 refills | Status: AC

## 2023-03-29 MED ORDER — AMIODARONE HCL 100 MG PO TABS: 100.0000 mg | ORAL_TABLET | Freq: Every day | ORAL | 0 refills | Status: DC

## 2023-03-29 NOTE — TOC Transition Note (Addendum)
Transition of Care [facility]) - CM/SW Discharge Note   Patient Details  Name: Donna Evans MRN: [id number] Date of Birth: Jan 28, 1946  Transition of Care [facility]) CM/SW Contact:  Truddie Hidden, RN Phone Number: 03/29/2023, 1:28 PM   Clinical Narrative:    Spoke with patient's nephew [name]. A family friend, [name] will transport the patient to the facility.   Patient assigned room # 604 Nurse will call report to [phone] Nurse, and family notified spoke with  Discharge summary sent in HUB.  TOC signing off.      Barriers to Discharge: Continued Medical Work up   Patient Goals and CMS Choice CMS Medicare.gov Compare Post Acute Care list provided to:: Patient    Discharge Placement                         Discharge Plan and Services Additional resources added to the After Visit Summary for       Post Acute Care Choice: Skilled Nursing Facility                               Social Determinants of Health (SDOH) Interventions SDOH Screenings   Food Insecurity: No Food Insecurity (03/24/2023)  Housing: Low Risk  (03/24/2023)  Transportation Needs: No Transportation Needs (03/24/2023)  Utilities: Not At Risk (03/24/2023)  Depression (PHQ2-9): High Risk (12/27/2022)  Tobacco Use: Medium Risk (03/23/2023)     Readmission Risk Interventions    03/24/2023    2:47 PM  Readmission Risk Prevention Plan  Transportation Screening Complete  Medication Review (RN Care Manager) Complete  PCP or Specialist appointment within 3-5 days of discharge Complete  HRI or Home Care Consult Complete  SW Recovery Care/Counseling Consult Complete  Palliative Care Screening Not Applicable  Skilled Nursing Facility Complete

## 2023-03-29 NOTE — Progress Notes (Signed)
Report called to Amy at [facility], patient waiting on friend to transport her.

## 2023-03-29 NOTE — Discharge Summary (Signed)
Physician Discharge Summary   Patient: Donna Evans MRN: [id number] DOB: [birth year]  Admit date:     03/23/2023  Discharge date: 03/29/23  Discharge Physician: Alford Highland   PCP: Sherron Monday, MD   Recommendations at discharge:   Follow-up with team at rehab 1 day Follow-up cardiology 2 weeks Follow-up Dr. Marcell Barlow neurosurgery 1 month  Discharge Diagnoses: Principal Problem:   Subdural hematoma Active Problems:   Subarachnoid hemorrhage   Paroxysmal atrial fibrillation   Cellulitis of right foot   Syncope   Alkaline phosphatase elevation   CKD (chronic kidney disease), stage III   Orthostatic hypotension   Hypokalemia   Alcohol use   Protein-calorie malnutrition, severe   Hypomagnesemia   Acute urinary retention   Hypophosphatemia   Multiple rib fractures   Compression fracture of L2   Atrial fibrillation with rapid ventricular response   Tongue ulceration    Hospital Course: 77 year old female that lives alone.  Brought in with altered mental status and frequent falls.  Past medical history of TIA, A-fib, hypertension, GERD.  Patient complains of right-sided pain.  She has been off of blood thinners for a while.  Patient found to have subdural hematoma and subarachnoid hemorrhage on CT scan and MRI.  Bridget Hartshorn concerned that she may have had a seizure.  He has witnessed 1 fall in the past where she was staring blank and then started shaking and fell backwards.  EEG negative.  4/12.  Patient still complaining of a lot of pain.  Seen by neurology and gabapentin started at night.  Physical therapy recommending rehab.  Patient having some abdominal discomfort.  Told nursing staff about back discomfort. 4/13.  Patient complains of pains all over.  Pointed out redness on her right foot, so I started antibiotics for cellulitis.  Patient went into rapid atrial fibrillation and needed to restart metoprolol. 4/14.  Patient complains of not sleeping.  Trazodone  ordered. 4/15.  Patient stable to go out to rehab.  Currently waiting for insurance authorization. 4/16.  Patient complained of a headache.  Given trazodone last night for sleep.  I will decrease dose of trazodone and use as needed.  Repeat CT scan showed stable 18 mm right bitemporal cerebral hemorrhagic contusions.  Regressed left-sided subdural hematoma and subarachnoid hemorrhage both are now trace.  Patient was given IV magnesium and Tylenol for headache.  Will try IV Phenergan.  As per neurology headaches can happen with brain bleeds and will take time to improve.     Assessment and Plan: * Subdural hematoma Appreciate neurosurgery consultation.  Okay to work with physical therapy and Occupational Therapy.  No blood thinners for 2 weeks.  Can go back on Eliquis 5 mg twice a day in 2 weeks.  Repeat CT scan showed reduction of left-sided hemorrhage and stable right-sided hemorrhage.  Follow-up as outpatient for repeat CT scan in 1 month as per neurosurgery.  Subarachnoid hemorrhage See above plan.    Cellulitis of right foot Change ancef to keflex for 4 more days upon discharge.  Cellulitis improved.  Paroxysmal atrial fibrillation Continue metoprolol and amiodarone.  No blood thinners for 2 weeks secondary to subarachnoid hemorrhage.  Patient in normal sinus rhythm with bradycardia.  Needs metoprolol and amiodarone to stay in sinus rhythm and keep heart rate control.  When I held the patient's a beta-blocker on admission the patient went into rapid atrial fibrillation.  Syncope Neurology started gabapentin at night.  Patient was orthostatic initially.  Keep working with physical  therapy.   Tongue ulceration Likely apthous ulcer, will give viscous lidocaine  Compression fracture of L2 Progression from the last imaging.  Pain control.  Lidoderm patch.  Multiple rib fractures Chronic in nature  Hypophosphatemia Replaced  Acute urinary  retention Resolved  Hypomagnesemia Replaced  Protein-calorie malnutrition, severe Continue supplements  Alcohol use Completed high-dose thiamine will continue oral thiamine now.  Hypokalemia Replaced  Orthostatic hypotension Continue working with physical therapy.  CKD (chronic kidney disease), stage III CKD stage IIIa.  Today's creatinine 1.18 with a GFR 48.  Alkaline phosphatase elevation GGT elevated.  Right upper quadrant ultrasound negative.  Still could be Paget's disease of the bone.         Consultants: Neurosurgery, neurology Procedures performed: none  Disposition: Rehabilitation facility Diet recommendation:  Regular DISCHARGE MEDICATION: Allergies as of 03/29/2023       Reactions   Ciprofloxacin Nausea And Vomiting, Hives   Codeine Anaphylaxis, Hives, Other (See Comments)   Reaction:  Unknown   Fluconazole Hives   Latex Anaphylaxis, Rash   Meperidine Anaphylaxis, Hives   Reaction:  Unknown Reaction:  Unknown   Unknown   Morphine And Related Hives, Anaphylaxis, Other (See Comments)   Reaction:  Unknown   unknown  Other reaction(s): Other (See Comments)  unknown   Doxycycline Nausea And Vomiting   Flagyl [metronidazole] Nausea And Vomiting   Reaction:  Unknown    Influenza Vaccines Swelling   Localized swelling   Tape Other (See Comments)   Pt allergic to Adhesive tape and latex.(  Skin breaks out)   Valtrex [valacyclovir Hcl] Hives   Buprenorphine    Other reaction(s): Other (see comments) unknown   Duloxetine Other (See Comments)   Other Reaction(s): GI intolerance Reaction:  Unknown     Reaction:  Unknown     Reaction:  Unknown   Silicone Other (See Comments)   Pt allergic to Adhesive tape and latex.(  Skin breaks out)   Tramadol    Other reaction(s): Not available   Amoxicillin-pot Clavulanate Nausea And Vomiting   Sulfamethoxazole-trimethoprim Nausea And Vomiting, Rash   Valacyclovir Rash        Medication List      STOP taking these medications    alum & mag hydroxide-simeth 200-200-20 MG/5ML suspension Commonly known as: MAALOX/MYLANTA   apixaban 5 MG Tabs tablet Commonly known as: ELIQUIS   Belsomra 10 MG Tabs Generic drug: Suvorexant   busPIRone 5 MG tablet Commonly known as: BUSPAR   cyclobenzaprine 10 MG tablet Commonly known as: FLEXERIL   midodrine 2.5 MG tablet Commonly known as: PROAMATINE   rosuvastatin 10 MG tablet Commonly known as: CRESTOR   torsemide 20 MG tablet Commonly known as: DEMADEX   Vitamin D (Ergocalciferol) 50000 units Caps       TAKE these medications    acetaminophen 500 MG tablet Commonly known as: TYLENOL Take 1,000 mg by mouth every 6 (six) hours as needed.   amiodarone 100 MG tablet Commonly known as: PACERONE Take 1 tablet (100 mg total) by mouth daily. Start taking on: March 30, 2023 What changed:  medication strength See the new instructions.   ascorbic acid 500 MG tablet Commonly known as: VITAMIN C Take 1 tablet (500 mg total) by mouth daily.   calcium carbonate 500 MG chewable tablet Commonly known as: TUMS - dosed in mg elemental calcium Chew 1 tablet by mouth 3 (three) times daily as needed for indigestion or heartburn.   cephALEXin 500 MG capsule Commonly known as: KEFLEX  Take 1 capsule (500 mg total) by mouth every 8 (eight) hours for 4 days.   diphenhydrAMINE-zinc acetate cream Commonly known as: BENADRYL Apply topically 3 (three) times daily as needed for itching.   feeding supplement Liqd Take 237 mLs by mouth 2 (two) times daily between meals.   folic acid 1 MG tablet Commonly known as: FOLVITE Take 1 tablet (1 mg total) by mouth daily.   gabapentin 100 MG capsule Commonly known as: NEURONTIN Two tabs po qhs for two more nights then three tabs po qhs afterwards   HYDROcodone-acetaminophen 5-325 MG tablet Commonly known as: NORCO/VICODIN Take 1 tablet by mouth every 8 (eight) hours as needed for severe  pain.   lidocaine 2 % solution Commonly known as: XYLOCAINE Use as directed 15 mLs in the mouth or throat 4 (four) times daily -  before meals and at bedtime.   lidocaine 5 % Commonly known as: LIDODERM Place 1 patch onto the skin daily. Remove & Discard patch within 12 hours or as directed by MD. Apply to back.   loratadine 10 MG tablet Commonly known as: CLARITIN Take 1 tablet (10 mg total) by mouth daily. Start taking on: March 30, 2023   metoprolol tartrate 25 MG tablet Commonly known as: LOPRESSOR Take 0.5 tablets (12.5 mg total) by mouth 2 (two) times daily. What changed: how much to take   multivitamin with minerals Tabs tablet Take 1 tablet by mouth daily. Start taking on: March 30, 2023   pantoprazole 40 MG tablet Commonly known as: PROTONIX Take 1 tablet (40 mg total) by mouth daily. Start taking on: March 30, 2023   thiamine 100 MG tablet Commonly known as: Vitamin B-1 Take 1 tablet (100 mg total) by mouth daily. Start taking on: March 30, 2023   traZODone 50 MG tablet Commonly known as: DESYREL Take 0.5 tablets (25 mg total) by mouth at bedtime as needed for sleep.        Contact information for follow-up providers     Paraschos, Alexander, MD. Go in 2 week(s).   Specialty: Cardiology Why: Your appointment has been made for May 20th at 2:30pm with Leanora Ivanoff, PA. Thanks! Contact information: [address] [facility] [place] [address] [phone]         Venetia Night, MD Follow up in 1 month(s).   Specialty: Neurosurgery Contact information: [address] [place] [address] [phone]              Contact information for after-discharge care     Destination     [facility] Preferred SNF .   Service: Skilled Nursing Contact information: [address] [place] [address] [phone]                    Discharge Exam: Ceasar Mons  Weights   03/27/23 0419 03/28/23 0500 03/29/23 0355  Weight: 52.2 kg 51.5 kg 49.9 kg   Physical Exam HENT:     Head: Normocephalic.     Mouth/Throat:     Pharynx: No oropharyngeal exudate.     Comments: Tongue ulceration right Eyes:     General: Lids are normal.     Conjunctiva/sclera: Conjunctivae normal.  Cardiovascular:     Rate and Rhythm: Regular rhythm. Bradycardia present.     Heart sounds: S1 normal and S2 normal. Murmur heard.     Systolic murmur is present with a grade of 2/6.  Pulmonary:     Breath sounds:  Examination of the right-lower field reveals decreased breath sounds. Examination of the left-lower field reveals decreased breath sounds. Decreased breath sounds present. No wheezing, rhonchi or rales.  Abdominal:     Palpations: Abdomen is soft.     Tenderness: There is no abdominal tenderness.  Musculoskeletal:     Right lower leg: No swelling.     Left lower leg: No swelling.     Comments: No pain palpating over lumbar spine.  Skin:    General: Skin is warm.     Comments: Redness on the top of the right foot.  Neurological:     Mental Status: She is alert.     Comments: Patient moves all extremities.      Condition at discharge: stable  The results of significant diagnostics from this hospitalization (including imaging, microbiology, ancillary and laboratory) are listed below for reference.   Imaging Studies: CT HEAD WO CONTRAST ( )  Result Date: 03/29/2023 CLINICAL DATA:  77 year old female status post fall with intracranial hemorrhage. EXAM: CT HEAD WITHOUT CONTRAST TECHNIQUE: Contiguous axial images were obtained from the base of the skull through the vertex without intravenous contrast. RADIATION DOSE REDUCTION: This exam was performed according to the departmental dose-optimization program which includes automated exposure control, adjustment of the mA and/or kV according to patient size and/or use of iterative reconstruction technique. COMPARISON:   Brain MRI 03/24/2023 and earlier. FINDINGS: Brain: Bitemporal small hemorrhagic contusions (18 mm on the right, 10 mm on the left, have not significantly changed since 03/23/2023. No significant associated edema or mass effect. Left side subdural hematoma has regressed since 03/24/2023. Currently only trace left tentorial blood is identified by CT. But there is probably a small volume of residual left inferior temporal gyrus subarachnoid hemorrhage on coronal image 33. No IVH or ventriculomegaly. No new areas of intracranial hemorrhage. Basilar cisterns are normal. Stable gray-white matter differentiation throughout the brain. No cortically based acute infarct identified. Vascular: Calcified atherosclerosis at the skull base. No suspicious intracranial vascular hyperdensity. Skull: No acute osseous abnormality identified. Sinuses/Orbits: Visualized paranasal sinuses and mastoids are stable and well aerated. Other: Regressed broad-based right posterior scalp hematoma. Orbits soft tissues appears stable and negative. Retained secretions in the visible pharynx. IMPRESSION: 1. Stable small (up to 18 mm on the right) bitemporal cerebral hemorrhagic contusions. Regressed left side subdural hematoma and subarachnoid hemorrhage since 03/24/2023, both are now trace. 2. No significant cerebral edema or mass effect. 3. No new intracranial abnormality. Electronically Signed   By: Odessa Fleming M.D.   On: 03/29/2023 12:04   US Abdomen Limited RUQ (LIVER/GB)  Result Date: 03/26/2023 CLINICAL DATA:  Elevated liver enzymes, history of cholecystectomy EXAM: ULTRASOUND ABDOMEN LIMITED RIGHT UPPER QUADRANT COMPARISON:  CT chest abdomen and pelvis November 09, 2022 FINDINGS: Gallbladder: The gallbladder is surgically absent. No sonographic Murphy sign noted by sonographer. Common bile duct: Diameter: 5 mm Liver: No focal lesion identified. Within normal limits in parenchymal echogenicity. Portal vein is patent on color Doppler imaging  with normal direction of blood flow towards the liver. Other: None. IMPRESSION: Normal right upper quadrant ultrasound. Electronically Signed   By: Jacob Moores M.D.   On: 03/26/2023 20:09   DG Lumbar Spine 2-3 Views  Result Date: 03/25/2023 CLINICAL DATA:  Back pain with fall a few weeks ago. EXAM: LUMBAR SPINE - 2-3 VIEW COMPARISON:  Prior lumbar spine films on 08/09/2022. CT of the chest on 11/09/2022 FINDINGS: There is an osteopenic compression fracture of the L2 vertebral body involving the  superior endplate with approximately 50-60% loss of vertebral body height anteriorly. No evidence of significant retropulsion. This fracture was new between the plain film in August, 2023 and CT of 11/09/2022. At the time of CT, loss of vertebral body height was less severe and more central, only approaching 20-25%. Other vertebral body heights are intact. No evidence significant degenerative disc disease. Stable atherosclerosis of the abdominal aorta. No bony lesions identified. IMPRESSION: Progression of osteopenic compression fracture of the L2 vertebral body since prior CT of the chest last November. There now is approximately 50-60% loss of vertebral body height anteriorly. No other lumbar compression fractures identified. Electronically Signed   By: Irish Lack M.D.   On: 03/25/2023 11:43   DG Thoracic Spine 2 View  Result Date: 03/25/2023 CLINICAL DATA:  Back pain with fall a few weeks ago. EXAM: THORACIC SPINE 2 VIEWS COMPARISON:  CT of the chest on 11/09/2022 FINDINGS: No acute fracture or subluxation. Mild degenerative disc disease present. No bony lesions or destruction. IMPRESSION: Mild degenerative disc disease of the thoracic spine. Electronically Signed   By: Irish Lack M.D.   On: 03/25/2023 11:40   EEG adult  Result Date: 03/24/2023 Charlsie Quest, MD     03/24/2023  4:37 PM Patient Name: Donna Evans MRN: [id number] Epilepsy Attending:Priyanka Annabelle Harman Referring Physician/Provider:  Alford Highland, MD Date: 03/24/2023 Duration: 30.22 mins Patient history: 77yo F with left subdural hematoma and subarachnoid hemorrhage. EEG to evaluate for seizure Level of alertness: Awake AEDs during EEG study: None Technical aspects: This EEG study was done with scalp electrodes positioned according to the 10-20 International system of electrode placement. Electrical activity was reviewed with band pass filter of 1-70Hz , sensitivity of 7 uV/mm, display speed of 40mm/sec with a  notched filter applied as appropriate. EEG data were recorded continuously and digitally stored.  Video monitoring was available and reviewed as appropriate. Description: The posterior dominant rhythm consists of 9-10 Hz activity of moderate voltage (25-35 uV) seen predominantly in posterior head regions, symmetric and reactive to eye opening and eye closing. Hyperventilation and photic stimulation were not performed.   IMPRESSION: This study is within normal limits. No seizures or epileptiform discharges were seen throughout the recording. A normal interictal EEG does not exclude the diagnosis of epilepsy. Charlsie Quest   MR BRAIN WO CONTRAST  Result Date: 03/24/2023 CLINICAL DATA:  Initial evaluation for syncope. EXAM: MRI HEAD WITHOUT CONTRAST TECHNIQUE: Multiplanar, multiecho pulse sequences of the brain and surrounding structures were obtained without intravenous contrast. COMPARISON:  Prior CTs from 03/23/2023. FINDINGS: Brain: Generalized age-related cerebral atrophy. Patchy T2/FLAIR hyperintensity involving the periventricular and deep white matter both cerebral hemispheres, consistent with chronic small vessel ischemic disease, moderate in nature. Acute subdural hematoma overlying the left cerebral convexity again seen. This measures up to 3 mm in maximal thickness at the level of the left parietal lobe. No significant mass effect or midline shift. Additional scattered posttraumatic subarachnoid hemorrhage at the  peripheral left temporal lobe. Few small superimposed hemorrhagic contusions present within this region, largest of which measures approximately 1.5 cm. Additional hemorrhagic contusion present at the contralateral right temporal lobe. This measures up to approximately 1.7 cm. No significant mass effect. Additionally, there is scattered small volume subarachnoid hemorrhage at the peripheral left cerebellum at the level of the CP angle cistern. Trace subarachnoid noted within the interpeduncular cistern. Overall, these findings are relatively similar to prior CT. No evidence for significant interval bleeding since prior. No evidence for  acute or subacute ischemia. Gray-white matter differentiation otherwise maintained. No mass lesion or hydrocephalus. Pituitary gland and suprasellar region within normal limits. Vascular: Major intracranial vascular flow voids are maintained. Skull and upper cervical spine: Craniocervical junction within normal limits. Bone marrow signal intensity normal. Evolving right-sided scalp contusion noted. Sinuses/Orbits: Globes and orbital soft tissues demonstrate no acute finding. Prior bilateral ocular lens replacement. Small volume layering secretions noted within the right sphenoid sinus. Paranasal sinuses are otherwise largely clear. Trace bilateral mastoid effusions, of doubtful significance. Other: None. IMPRESSION: 1. Acute subdural hematoma overlying the left cerebral convexity, measuring up to 3 mm in maximal thickness. No significant mass effect or midline shift. 2. Scattered posttraumatic subarachnoid hemorrhage involving the peripheral left temporal lobe and left cerebellum. Few small superimposed hemorrhagic contusions about the left greater than right temporal lobes as above. Overall, these findings are relatively similar as compared to prior CT from 03/23/2023. 3. Underlying age-related cerebral atrophy with moderate chronic small vessel ischemic disease. No other acute  intracranial abnormality. Electronically Signed   By: Rise Mu M.D.   On: 03/24/2023 02:26   CT Head Wo Contrast  Result Date: 03/23/2023 CLINICAL DATA:  Fall, head bleed EXAM: CT HEAD WITHOUT CONTRAST TECHNIQUE: Contiguous axial images were obtained from the base of the skull through the vertex without intravenous contrast. RADIATION DOSE REDUCTION: This exam was performed according to the departmental dose-optimization program which includes automated exposure control, adjustment of the mA and/or kV according to patient size and/or use of iterative reconstruction technique. COMPARISON:  Earlier today FINDINGS: Brain: Subarachnoid hemorrhage again seen in the left temporal region. The previously seen 2 mm left anterior para falcine subdural hematoma and left temporal subdural hematoma again noted measuring 3 mm, unchanged. Focus of intracerebral hemorrhage now seen in the anterior right temporal lobe, new since prior study. This measures 1.2 cm. No mass effect or midline shift. No hydrocephalus. Hematoma Vascular: No hyperdense vessel or unexpected calcification. Skull: No acute calvarial abnormality. Sinuses/Orbits: No acute findings Other: None IMPRESSION: Stable left temporal subdural and subarachnoid hemorrhages. Stable left parafalcine subdural hematoma. New area of hemorrhage in the anterior right temporal lobe, 1.2 cm. Electronically Signed   By: Charlett Nose M.D.   On: 03/23/2023 22:34   CT Head Wo Contrast  Result Date: 03/23/2023 CLINICAL DATA:  77 year old female with fall and head and neck injury. EXAM: CT HEAD WITHOUT CONTRAST CT CERVICAL SPINE WITHOUT CONTRAST TECHNIQUE: Multidetector CT imaging of the head and cervical spine was performed following the standard protocol without intravenous contrast. Multiplanar CT image reconstructions of the cervical spine were also generated. RADIATION DOSE REDUCTION: This exam was performed according to the departmental dose-optimization  program which includes automated exposure control, adjustment of the mA and/or kV according to patient size and/or use of iterative reconstruction technique. COMPARISON:  02/01/2023 prior studies FINDINGS: CT HEAD FINDINGS Brain: A 2 mm LEFT anterior parafalcine subdural hematoma and a 3 mm anterior LEFT temporal subdural hematoma identified. LEFT temporal subarachnoid hemorrhage is also noted. Equivocal tiny amount of RIGHT sylvian fissure subarachnoid hemorrhage noted. There is no evidence of mass effect or midline shift. No infarct or hydrocephalus noted. Atrophy and chronic small-vessel white matter ischemic changes again noted. Vascular: Carotid atherosclerotic calcifications are noted. Skull: No acute abnormality Sinuses/Orbits: A very small amount of fluid in the RIGHT sphenoid sinus noted. No other acute abnormalities identified. Other: None CT CERVICAL SPINE FINDINGS Alignment: Unremarkable.  No acute subluxation. Skull base and vertebrae: No acute fracture. No  primary bone lesion or focal pathologic process. Soft tissues and spinal canal: No prevertebral fluid or swelling. No visible canal hematoma. Disc levels: Multilevel degenerative disc disease and facet arthropathy are again noted contributing to multilevel central spinal and bony foraminal narrowing. Upper chest: No acute abnormality. Emphysema and biapical pleuroparenchymal scarring again noted. Other: None IMPRESSION: 1. 2 mm LEFT anterior parafalcine subdural hematoma and 3 mm anterior LEFT temporal subdural hematoma. LEFT temporal subarachnoid hemorrhage and possibly RIGHT sylvian fissure subarachnoid hemorrhage. No evidence of mass effect or midline shift. 2. Atrophy and chronic small-vessel white matter ischemic changes. 3. No evidence of acute injury to the cervical spine. Multilevel degenerative changes within the cervical spine contributing to multilevel central spinal and bony foraminal narrowing. Critical Value/emergent results were called  by telephone at the time of interpretation on 03/23/2023 at 4:08 pm to provider [facility] , who verbally acknowledged these results. Electronically Signed   By: Harmon Pier M.D.   On: 03/23/2023 16:10   CT Cervical Spine Wo Contrast  Result Date: 03/23/2023 CLINICAL DATA:  77 year old female with fall and head and neck injury. EXAM: CT HEAD WITHOUT CONTRAST CT CERVICAL SPINE WITHOUT CONTRAST TECHNIQUE: Multidetector CT imaging of the head and cervical spine was performed following the standard protocol without intravenous contrast. Multiplanar CT image reconstructions of the cervical spine were also generated. RADIATION DOSE REDUCTION: This exam was performed according to the departmental dose-optimization program which includes automated exposure control, adjustment of the mA and/or kV according to patient size and/or use of iterative reconstruction technique. COMPARISON:  02/01/2023 prior studies FINDINGS: CT HEAD FINDINGS Brain: A 2 mm LEFT anterior parafalcine subdural hematoma and a 3 mm anterior LEFT temporal subdural hematoma identified. LEFT temporal subarachnoid hemorrhage is also noted. Equivocal tiny amount of RIGHT sylvian fissure subarachnoid hemorrhage noted. There is no evidence of mass effect or midline shift. No infarct or hydrocephalus noted. Atrophy and chronic small-vessel white matter ischemic changes again noted. Vascular: Carotid atherosclerotic calcifications are noted. Skull: No acute abnormality Sinuses/Orbits: A very small amount of fluid in the RIGHT sphenoid sinus noted. No other acute abnormalities identified. Other: None CT CERVICAL SPINE FINDINGS Alignment: Unremarkable.  No acute subluxation. Skull base and vertebrae: No acute fracture. No primary bone lesion or focal pathologic process. Soft tissues and spinal canal: No prevertebral fluid or swelling. No visible canal hematoma. Disc levels: Multilevel degenerative disc disease and facet arthropathy are again noted  contributing to multilevel central spinal and bony foraminal narrowing. Upper chest: No acute abnormality. Emphysema and biapical pleuroparenchymal scarring again noted. Other: None IMPRESSION: 1. 2 mm LEFT anterior parafalcine subdural hematoma and 3 mm anterior LEFT temporal subdural hematoma. LEFT temporal subarachnoid hemorrhage and possibly RIGHT sylvian fissure subarachnoid hemorrhage. No evidence of mass effect or midline shift. 2. Atrophy and chronic small-vessel white matter ischemic changes. 3. No evidence of acute injury to the cervical spine. Multilevel degenerative changes within the cervical spine contributing to multilevel central spinal and bony foraminal narrowing. Critical Value/emergent results were called by telephone at the time of interpretation on 03/23/2023 at 4:08 pm to provider [facility] , who verbally acknowledged these results. Electronically Signed   By: Harmon Pier M.D.   On: 03/23/2023 16:10   DG Ribs Unilateral W/Chest Right  Result Date: 03/23/2023 CLINICAL DATA:  Fall with right-sided pain. EXAM: RIGHT RIBS AND CHEST - 3+ VIEW COMPARISON:  Chest x-ray 02/01/2023.  CT of the chest 11/09/2022 FINDINGS: Again seen are healed posterior right eighth and ninth  rib fractures similar to prior. Chronic posterior left eighth and ninth rib fractures are also unchanged. No new acute fractures are seen. There is no focal lung infiltrate, pleural effusion or pneumothorax. Chronic appearing deformity of the left humeral head appears similar to prior. IMPRESSION: 1. No acute fracture. 2. Chronic bilateral rib fractures. Electronically Signed   By: Darliss Cheney M.D.   On: 03/23/2023 16:08    Microbiology: Results for orders placed or performed during the hospital encounter of 03/23/23  MRSA Next Gen by PCR, Nasal     Status: None   Collection Time: 03/24/23  2:48 AM   Specimen: Nasal Mucosa; Nasal Swab  Result Value Ref Range Status   MRSA by PCR Next Gen NOT DETECTED NOT DETECTED  Final    Comment: (NOTE) The GeneXpert MRSA Assay (FDA approved for NASAL specimens only), is one component of a comprehensive MRSA colonization surveillance program. It is not intended to diagnose MRSA infection nor to guide or monitor treatment for MRSA infections. Test performance is not FDA approved in patients less than 44 years old. Performed at [facility], [address] [place], [place] [address]     Labs: CBC: Recent Labs  Lab 03/23/23 1457 03/24/23 0620 03/25/23 0255 03/29/23 0425  WBC 9.7 7.4 6.6  --   HGB 13.3 11.0* 11.3* 11.3*  HCT 41.1 33.4* 33.6*  --   MCV 94.5 93.6 92.8  --   PLT 214 207 211  --    Basic Metabolic Panel: Recent Labs  Lab 03/23/23 1457 03/24/23 0620 03/25/23 0255 03/26/23 0638 03/27/23 0613 03/29/23 0425  NA 139 137 131* 135  --  136  K 3.4* 3.0* 4.6 3.9  --  4.2  CL 105 106 107 103  --  100  CO2 21* 21* 20* 23  --  30  GLUCOSE 93 139* 109* 91  --  96  BUN 22 18 22 18   --  28*  CREATININE 1.26* 1.04* 1.05* 0.97  --  1.18*  CALCIUM 8.0* 7.7* 8.0* 8.2*  --  8.0*  MG 1.6*  --  2.5* 1.8 1.7 2.3  PHOS  --   --  2.0* 3.9 4.1 3.1   Liver Function Tests: Recent Labs  Lab 03/23/23 1457 03/24/23 0620 03/25/23 0255 03/26/23 0638  AST 58* 37 55* 79*  ALT 31 25 32 44  ALKPHOS 397* 353* 526* 537*  BILITOT 1.8* 1.5* 0.8 1.0  PROT 6.8 5.5* 6.3* 6.5  ALBUMIN 2.9* 2.5* 2.8* 2.7*   CBG: Recent Labs  Lab 03/24/23 0243 03/24/23 0343 03/24/23 0458  GLUCAP 67* 66* 170*    Discharge time spent: greater than 30 minutes.  Signed: Alford Highland, MD [facility] 03/29/2023

## 2023-03-29 NOTE — TOC Progression Note (Addendum)
Transition of Care [facility]) - Progression Note    Patient Details  Name: Donna Evans MRN: [id number] Date of Birth: Jul 06, 1946  Transition [facility]) CM/SW Contact  Truddie Hidden, RN Phone Number: 03/29/2023, 10:48 AM  Clinical Narrative:    Per Tammy at [facility] patient approved for SNF  Ambulance transport denied.   Per Tiffany at facility patient can admit today.  MD and nurse notified .   Expected Discharge Plan: Skilled Nursing Facility Barriers to Discharge: Continued Medical Work up  Expected Discharge Plan and Services     Post Acute Care Choice: Skilled Nursing Facility Living arrangements for the past 2 months: Single Family Home                                       Social Determinants of Health (SDOH) Interventions SDOH Screenings   Food Insecurity: No Food Insecurity (03/24/2023)  Housing: Low Risk  (03/24/2023)  Transportation Needs: No Transportation Needs (03/24/2023)  Utilities: Not At Risk (03/24/2023)  Depression (PHQ2-9): High Risk (12/27/2022)  Tobacco Use: Medium Risk (03/23/2023)    Readmission Risk Interventions    03/24/2023    2:47 PM  Readmission Risk Prevention Plan  Transportation Screening Complete  Medication Review (RN Care Manager) Complete  PCP or Specialist appointment within 3-5 days of discharge Complete  HRI or Home Care Consult Complete  SW Recovery Care/Counseling Consult Complete  Palliative Care Screening Not Applicable  Skilled Nursing Facility Complete

## 2023-03-29 NOTE — Assessment & Plan Note (Signed)
Likely apthous ulcer, will give viscous lidocaine

## 2023-03-30 NOTE — Telephone Encounter (Signed)
CT 04/19/23 Appt with Dr. Myer Haff 04/21/2023

## 2023-04-01 DIAGNOSIS — G4700 Insomnia, unspecified: Secondary | ICD-10-CM | POA: Diagnosis not present

## 2023-04-01 DIAGNOSIS — K219 Gastro-esophageal reflux disease without esophagitis: Secondary | ICD-10-CM | POA: Diagnosis not present

## 2023-04-01 DIAGNOSIS — I951 Orthostatic hypotension: Secondary | ICD-10-CM | POA: Diagnosis not present

## 2023-04-01 DIAGNOSIS — L03115 Cellulitis of right lower limb: Secondary | ICD-10-CM | POA: Diagnosis not present

## 2023-04-01 DIAGNOSIS — E7800 Pure hypercholesterolemia, unspecified: Secondary | ICD-10-CM | POA: Diagnosis not present

## 2023-04-01 DIAGNOSIS — R55 Syncope and collapse: Secondary | ICD-10-CM | POA: Diagnosis not present

## 2023-04-01 DIAGNOSIS — S065X9D Traumatic subdural hemorrhage with loss of consciousness of unspecified duration, subsequent encounter: Secondary | ICD-10-CM | POA: Diagnosis not present

## 2023-04-01 DIAGNOSIS — I130 Hypertensive heart and chronic kidney disease with heart failure and stage 1 through stage 4 chronic kidney disease, or unspecified chronic kidney disease: Secondary | ICD-10-CM | POA: Diagnosis not present

## 2023-04-01 DIAGNOSIS — E43 Unspecified severe protein-calorie malnutrition: Secondary | ICD-10-CM | POA: Diagnosis not present

## 2023-04-01 DIAGNOSIS — N1832 Chronic kidney disease, stage 3b: Secondary | ICD-10-CM | POA: Diagnosis not present

## 2023-04-01 DIAGNOSIS — I480 Paroxysmal atrial fibrillation: Secondary | ICD-10-CM | POA: Diagnosis not present

## 2023-04-04 DIAGNOSIS — L03115 Cellulitis of right lower limb: Secondary | ICD-10-CM | POA: Diagnosis not present

## 2023-04-04 DIAGNOSIS — S065X9D Traumatic subdural hemorrhage with loss of consciousness of unspecified duration, subsequent encounter: Secondary | ICD-10-CM | POA: Diagnosis not present

## 2023-04-04 DIAGNOSIS — I480 Paroxysmal atrial fibrillation: Secondary | ICD-10-CM | POA: Diagnosis not present

## 2023-04-04 DIAGNOSIS — N1832 Chronic kidney disease, stage 3b: Secondary | ICD-10-CM | POA: Diagnosis not present

## 2023-04-04 DIAGNOSIS — G4700 Insomnia, unspecified: Secondary | ICD-10-CM | POA: Diagnosis not present

## 2023-04-04 DIAGNOSIS — E43 Unspecified severe protein-calorie malnutrition: Secondary | ICD-10-CM | POA: Diagnosis not present

## 2023-04-04 DIAGNOSIS — R55 Syncope and collapse: Secondary | ICD-10-CM | POA: Diagnosis not present

## 2023-04-04 DIAGNOSIS — I130 Hypertensive heart and chronic kidney disease with heart failure and stage 1 through stage 4 chronic kidney disease, or unspecified chronic kidney disease: Secondary | ICD-10-CM | POA: Diagnosis not present

## 2023-04-04 DIAGNOSIS — I951 Orthostatic hypotension: Secondary | ICD-10-CM | POA: Diagnosis not present

## 2023-04-04 DIAGNOSIS — K219 Gastro-esophageal reflux disease without esophagitis: Secondary | ICD-10-CM | POA: Diagnosis not present

## 2023-04-04 DIAGNOSIS — S06360A Traumatic hemorrhage of cerebrum, unspecified, without loss of consciousness, initial encounter: Secondary | ICD-10-CM | POA: Diagnosis not present

## 2023-04-04 DIAGNOSIS — R519 Headache, unspecified: Secondary | ICD-10-CM | POA: Diagnosis not present

## 2023-04-05 DIAGNOSIS — E43 Unspecified severe protein-calorie malnutrition: Secondary | ICD-10-CM | POA: Diagnosis not present

## 2023-04-05 DIAGNOSIS — G4700 Insomnia, unspecified: Secondary | ICD-10-CM | POA: Diagnosis not present

## 2023-04-05 DIAGNOSIS — L03115 Cellulitis of right lower limb: Secondary | ICD-10-CM | POA: Diagnosis not present

## 2023-04-05 DIAGNOSIS — R600 Localized edema: Secondary | ICD-10-CM | POA: Diagnosis not present

## 2023-04-05 DIAGNOSIS — S065X9D Traumatic subdural hemorrhage with loss of consciousness of unspecified duration, subsequent encounter: Secondary | ICD-10-CM | POA: Diagnosis not present

## 2023-04-05 DIAGNOSIS — K219 Gastro-esophageal reflux disease without esophagitis: Secondary | ICD-10-CM | POA: Diagnosis not present

## 2023-04-05 DIAGNOSIS — R55 Syncope and collapse: Secondary | ICD-10-CM | POA: Diagnosis not present

## 2023-04-05 DIAGNOSIS — I130 Hypertensive heart and chronic kidney disease with heart failure and stage 1 through stage 4 chronic kidney disease, or unspecified chronic kidney disease: Secondary | ICD-10-CM | POA: Diagnosis not present

## 2023-04-05 DIAGNOSIS — I480 Paroxysmal atrial fibrillation: Secondary | ICD-10-CM | POA: Diagnosis not present

## 2023-04-05 DIAGNOSIS — S06360A Traumatic hemorrhage of cerebrum, unspecified, without loss of consciousness, initial encounter: Secondary | ICD-10-CM | POA: Diagnosis not present

## 2023-04-05 DIAGNOSIS — N1832 Chronic kidney disease, stage 3b: Secondary | ICD-10-CM | POA: Diagnosis not present

## 2023-04-05 DIAGNOSIS — I951 Orthostatic hypotension: Secondary | ICD-10-CM | POA: Diagnosis not present

## 2023-04-08 DIAGNOSIS — N1832 Chronic kidney disease, stage 3b: Secondary | ICD-10-CM | POA: Diagnosis not present

## 2023-04-08 DIAGNOSIS — R600 Localized edema: Secondary | ICD-10-CM | POA: Diagnosis not present

## 2023-04-08 DIAGNOSIS — G4700 Insomnia, unspecified: Secondary | ICD-10-CM | POA: Diagnosis not present

## 2023-04-08 DIAGNOSIS — R519 Headache, unspecified: Secondary | ICD-10-CM | POA: Diagnosis not present

## 2023-04-08 DIAGNOSIS — S065X9D Traumatic subdural hemorrhage with loss of consciousness of unspecified duration, subsequent encounter: Secondary | ICD-10-CM | POA: Diagnosis not present

## 2023-04-08 DIAGNOSIS — I130 Hypertensive heart and chronic kidney disease with heart failure and stage 1 through stage 4 chronic kidney disease, or unspecified chronic kidney disease: Secondary | ICD-10-CM | POA: Diagnosis not present

## 2023-04-08 DIAGNOSIS — R55 Syncope and collapse: Secondary | ICD-10-CM | POA: Diagnosis not present

## 2023-04-08 DIAGNOSIS — S06360A Traumatic hemorrhage of cerebrum, unspecified, without loss of consciousness, initial encounter: Secondary | ICD-10-CM | POA: Diagnosis not present

## 2023-04-08 DIAGNOSIS — E43 Unspecified severe protein-calorie malnutrition: Secondary | ICD-10-CM | POA: Diagnosis not present

## 2023-04-08 DIAGNOSIS — L03115 Cellulitis of right lower limb: Secondary | ICD-10-CM | POA: Diagnosis not present

## 2023-04-08 DIAGNOSIS — I480 Paroxysmal atrial fibrillation: Secondary | ICD-10-CM | POA: Diagnosis not present

## 2023-04-11 DIAGNOSIS — R519 Headache, unspecified: Secondary | ICD-10-CM | POA: Diagnosis not present

## 2023-04-11 DIAGNOSIS — S06360A Traumatic hemorrhage of cerebrum, unspecified, without loss of consciousness, initial encounter: Secondary | ICD-10-CM | POA: Diagnosis not present

## 2023-04-11 DIAGNOSIS — I130 Hypertensive heart and chronic kidney disease with heart failure and stage 1 through stage 4 chronic kidney disease, or unspecified chronic kidney disease: Secondary | ICD-10-CM | POA: Diagnosis not present

## 2023-04-11 DIAGNOSIS — G4700 Insomnia, unspecified: Secondary | ICD-10-CM | POA: Diagnosis not present

## 2023-04-11 DIAGNOSIS — N1832 Chronic kidney disease, stage 3b: Secondary | ICD-10-CM | POA: Diagnosis not present

## 2023-04-11 DIAGNOSIS — E43 Unspecified severe protein-calorie malnutrition: Secondary | ICD-10-CM | POA: Diagnosis not present

## 2023-04-11 DIAGNOSIS — L03115 Cellulitis of right lower limb: Secondary | ICD-10-CM | POA: Diagnosis not present

## 2023-04-11 DIAGNOSIS — R600 Localized edema: Secondary | ICD-10-CM | POA: Diagnosis not present

## 2023-04-11 DIAGNOSIS — I951 Orthostatic hypotension: Secondary | ICD-10-CM | POA: Diagnosis not present

## 2023-04-11 DIAGNOSIS — S065X9D Traumatic subdural hemorrhage with loss of consciousness of unspecified duration, subsequent encounter: Secondary | ICD-10-CM | POA: Diagnosis not present

## 2023-04-11 DIAGNOSIS — R55 Syncope and collapse: Secondary | ICD-10-CM | POA: Diagnosis not present

## 2023-04-11 DIAGNOSIS — I480 Paroxysmal atrial fibrillation: Secondary | ICD-10-CM | POA: Diagnosis not present

## 2023-04-14 ENCOUNTER — Telehealth: Payer: Self-pay | Admitting: Internal Medicine

## 2023-04-14 DIAGNOSIS — S065X0D Traumatic subdural hemorrhage without loss of consciousness, subsequent encounter: Secondary | ICD-10-CM | POA: Diagnosis not present

## 2023-04-14 DIAGNOSIS — S065X9D Traumatic subdural hemorrhage with loss of consciousness of unspecified duration, subsequent encounter: Secondary | ICD-10-CM | POA: Diagnosis not present

## 2023-04-14 DIAGNOSIS — G4700 Insomnia, unspecified: Secondary | ICD-10-CM | POA: Diagnosis not present

## 2023-04-14 DIAGNOSIS — I480 Paroxysmal atrial fibrillation: Secondary | ICD-10-CM | POA: Diagnosis not present

## 2023-04-14 DIAGNOSIS — K219 Gastro-esophageal reflux disease without esophagitis: Secondary | ICD-10-CM | POA: Diagnosis not present

## 2023-04-14 DIAGNOSIS — K589 Irritable bowel syndrome without diarrhea: Secondary | ICD-10-CM | POA: Diagnosis not present

## 2023-04-14 DIAGNOSIS — L03115 Cellulitis of right lower limb: Secondary | ICD-10-CM | POA: Diagnosis not present

## 2023-04-14 DIAGNOSIS — Z8673 Personal history of transient ischemic attack (TIA), and cerebral infarction without residual deficits: Secondary | ICD-10-CM | POA: Diagnosis not present

## 2023-04-14 DIAGNOSIS — E46 Unspecified protein-calorie malnutrition: Secondary | ICD-10-CM | POA: Diagnosis not present

## 2023-04-14 DIAGNOSIS — N1832 Chronic kidney disease, stage 3b: Secondary | ICD-10-CM | POA: Diagnosis not present

## 2023-04-14 DIAGNOSIS — F1020 Alcohol dependence, uncomplicated: Secondary | ICD-10-CM | POA: Diagnosis not present

## 2023-04-14 DIAGNOSIS — I951 Orthostatic hypotension: Secondary | ICD-10-CM | POA: Diagnosis not present

## 2023-04-14 DIAGNOSIS — M797 Fibromyalgia: Secondary | ICD-10-CM | POA: Diagnosis not present

## 2023-04-14 DIAGNOSIS — R339 Retention of urine, unspecified: Secondary | ICD-10-CM | POA: Diagnosis not present

## 2023-04-14 DIAGNOSIS — F411 Generalized anxiety disorder: Secondary | ICD-10-CM | POA: Diagnosis not present

## 2023-04-14 DIAGNOSIS — I5020 Unspecified systolic (congestive) heart failure: Secondary | ICD-10-CM | POA: Diagnosis not present

## 2023-04-14 DIAGNOSIS — M8008XD Age-related osteoporosis with current pathological fracture, vertebra(e), subsequent encounter for fracture with routine healing: Secondary | ICD-10-CM | POA: Diagnosis not present

## 2023-04-14 DIAGNOSIS — E876 Hypokalemia: Secondary | ICD-10-CM | POA: Diagnosis not present

## 2023-04-14 DIAGNOSIS — Z9181 History of falling: Secondary | ICD-10-CM | POA: Diagnosis not present

## 2023-04-14 DIAGNOSIS — I130 Hypertensive heart and chronic kidney disease with heart failure and stage 1 through stage 4 chronic kidney disease, or unspecified chronic kidney disease: Secondary | ICD-10-CM | POA: Diagnosis not present

## 2023-04-14 DIAGNOSIS — M800AXD Age-related osteoporosis with current pathological fracture, other site, subsequent encounter for fracture with routine healing: Secondary | ICD-10-CM | POA: Diagnosis not present

## 2023-04-14 DIAGNOSIS — G894 Chronic pain syndrome: Secondary | ICD-10-CM | POA: Diagnosis not present

## 2023-04-14 DIAGNOSIS — Z7901 Long term (current) use of anticoagulants: Secondary | ICD-10-CM | POA: Diagnosis not present

## 2023-04-14 NOTE — Telephone Encounter (Signed)
Thayer Ohm with [facility] called needing verbal orders for PT for patient. Gave a verbal okay for home health PT.

## 2023-04-19 ENCOUNTER — Ambulatory Visit
Admission: RE | Admit: 2023-04-19 | Discharge: 2023-04-19 | Disposition: A | Discharge status: Home / Self Care | Payer: PPO | Source: Ambulatory Visit | Attending: Neurosurgery | Admitting: Neurosurgery

## 2023-04-19 ENCOUNTER — Telehealth: Payer: Self-pay

## 2023-04-19 DIAGNOSIS — R519 Headache, unspecified: Secondary | ICD-10-CM | POA: Diagnosis not present

## 2023-04-19 DIAGNOSIS — I629 Nontraumatic intracranial hemorrhage, unspecified: Secondary | ICD-10-CM | POA: Insufficient documentation

## 2023-04-19 NOTE — Telephone Encounter (Signed)
Arthritis and Pain Review Call  Mires,[place] A 77 years, Female  DOB: [birth year]  M: [phone]  __________________________________________________ Arthritis and Pain Review (HC) Chart Review What recent interventions have been made by any provider to improve the patient's conditions in the last 3 months?: Gabapentin started at night Physical Therapy- Home Health Has there been any documented recent hospitalizations or ED visits since last visit with Clinical Lead?: Yes Brief Summary (including Medication and/or Diagnosis changes):: Subdural Hematoma and subarachnoid hemorrhage, Cellulitis of right foot, started on Keflex. Compression fracture of L2- Lidoderm patch. Adherence Review Does the [facility] have access to medication refill data?: No Disease State Questions Able to connect with the Patient?: Yes How would you rate your pain without medications (scale of 1-10)?: 8 How would you rate your pain with medications (scale of 1-10)?: 8 What time of day is your pain at its worst?: Morning What makes your pain better?: Nothing What makes your pain worse?: Trying to do anything What non-medication therapies have you tried to help with your pain?: Other, Warm compresses / heating pad Details: Physical therapy every morning at rehab center Hydrocodone- rarely because it does not help that much Engagement Notes dimock, eugenia on 04/18/2023 12:09 PM   CCM Billed Time: HC review Call: 20 minutes (04/18/23)   Joycelyn Das on 12/02/2022 02:08 PM osteoporosis Clinical Lead Review Review Adherence gaps identified?: No Drug Therapy Problems identified?: No Assessment: Uncontrolled Plan: patient still in pain Engagement Notes Lynann Bologna on 04/19/2023 02:05 PM Reviewed 3 mins

## 2023-04-20 ENCOUNTER — Telehealth: Payer: Self-pay | Admitting: Internal Medicine

## 2023-04-20 NOTE — Telephone Encounter (Signed)
Esther with [facility] wanted to let Dr. Ellsworth Lennox that this patient does not qualify for OT at this time based on the assessment that was done.

## 2023-04-21 ENCOUNTER — Encounter: Payer: Self-pay | Admitting: Neurosurgery

## 2023-04-21 ENCOUNTER — Ambulatory Visit (INDEPENDENT_AMBULATORY_CARE_PROVIDER_SITE_OTHER): Payer: PPO | Admitting: Neurosurgery

## 2023-04-21 VITALS — BP 120/70 | Ht 67.0 in | Wt 100.0 lb

## 2023-04-21 DIAGNOSIS — M542 Cervicalgia: Secondary | ICD-10-CM | POA: Diagnosis not present

## 2023-04-21 DIAGNOSIS — I629 Nontraumatic intracranial hemorrhage, unspecified: Secondary | ICD-10-CM

## 2023-04-21 DIAGNOSIS — G44301 Post-traumatic headache, unspecified, intractable: Secondary | ICD-10-CM

## 2023-04-21 NOTE — Progress Notes (Signed)
Referring Physician:  No referring provider defined for this encounter.  Primary Physician:  Margarita Mail, DO  History of Present Illness: 04/21/2023 [name] is here today with a chief complaint of headache.  She is here for follow-up for head injury.  She had migraine headaches prior to this fall, but has had more consistent headaches since that.  She is lost weight due to poor appetite.  Otherwise, she is doing reasonably well.    03/24/2023 [name] is here today with a chief complaint of fall.  She reports discomfort around the area of bruising on the right side of her face and back of her head.  She is at her baseline neurologically.   She has no complaints this morning other than a mild to moderate headache.  She apparently suffered a mechanical fall.  She was admitted for observation due to declining functional status at home as well as potential exacerbation of her medical issues.   I have utilized the care everywhere function in epic to review the outside records available from external health systems. Review of Systems:  A 10 point review of systems is negative, except for the pertinent positives and negatives detailed in the HPI.  Past Medical History: Past Medical History:  Diagnosis Date   Allergic state    Anginal pain (HCC)    Prinzmetal's angina   Anxiety    Arthritis    osteoarthritis   Breast cancer (HCC) 1990   right breast cancer   Chronic pain    Chronic pain    COPD (chronic obstructive pulmonary disease) (HCC)    Depression    Edema    Fibromyalgia    Foot fracture    Bilateral   GERD (gastroesophageal reflux disease)    History of kidney stones    Hypertension    Leg fracture, right    Low back pain    Lumbosacral neuritis    Medulloadrenal hyperfunction (HCC)    Migraine headache    Peripheral neuropathy    Shoulder fracture, right    Stroke (HCC)    TIA   Systemic lupus erythematosus (HCC)    Thyroid disease     TIA (transient ischemic attack) 11/28/2013   Transient global amnesia 2011    Past Surgical History: Past Surgical History:  Procedure Laterality Date   ABDOMINAL HYSTERECTOMY  1981   APPENDECTOMY  1957   AUGMENTATION MAMMAPLASTY Bilateral 1990   saline submuscular   BACK SURGERY  1989   BILATERAL TOTAL MASTECTOMY WITH AXILLARY LYMPH NODE DISSECTION  1990   BREAST IMPLANT EXCHANGE Bilateral 05/11/2016   Procedure: REMOVAL AND REPLACEMENT OF BREAST IMPLANTS ;  Surgeon: Glenna Fellows, MD;  Location: [facility];  Service: Plastics;  Laterality: Bilateral;   CAPSULECTOMY Bilateral 05/11/2016   Procedure: CAPSULECTOMY CAPSULORRAPHY ;  Surgeon: Glenna Fellows, MD;  Location: [facility];  Service: Plastics;  Laterality: Bilateral;   CATARACT EXTRACTION W/PHACO Right 12/26/2018   Procedure: CATARACT EXTRACTION PHACO AND INTRAOCULAR LENS PLACEMENT (IOC) RIGHT;  Surgeon: Galen Manila, MD;  Location: [facility];  Service: Ophthalmology;  Laterality: Right;  [place]  00:46 CDE 8.50 Fluid pack lot # [id number]   CATARACT EXTRACTION W/PHACO Left 01/23/2019   Procedure: CATARACT EXTRACTION PHACO AND INTRAOCULAR LENS PLACEMENT (IOC) LEFT;  Surgeon: Galen Manila, MD;  Location: [facility];  Service: Ophthalmology;  Laterality: Left;  [place]  00:45 CDE  8.41 fluid pack lot # [id number]   CHOLECYSTECTOMY  1979  COLONOSCOPY WITH PROPOFOL N/A 01/25/2018   Procedure: COLONOSCOPY WITH PROPOFOL;  Surgeon: Scot Jun, MD;  Location: [facility];  Service: Endoscopy;  Laterality: N/A;   COPD     ESOPHAGOGASTRODUODENOSCOPY (EGD) WITH PROPOFOL N/A 03/07/2018   Procedure: ESOPHAGOGASTRODUODENOSCOPY (EGD) WITH PROPOFOL;  Surgeon: Scot Jun, MD;  Location: [facility];  Service: Endoscopy;  Laterality: N/A;   FRACTURE SURGERY     HARDWARE REMOVAL Left 06/18/2020   Procedure: HARDWARE REMOVAL;  Surgeon: Lyndle Herrlich, MD;  Location: [facility];  Service: Orthopedics;   Laterality: Left;   HUMERUS IM NAIL Left 12/31/2019   Procedure: INTRAMEDULLARY (IM) NAIL HUMERAL;  Surgeon: Lyndle Herrlich, MD;  Location: [facility];  Service: Orthopedics;  Laterality: Left;   LIPOSUCTION Right 05/11/2016   Procedure: LIPOSUCTION;  Surgeon: Glenna Fellows, MD;  Location: [facility];  Service: Plastics;  Laterality: Right;   LIVER BIOPSY  2011   MASTECTOMY SUBCUTANEOUS Bilateral 1990   MASTOPEXY Bilateral 05/11/2016   Procedure: MASTOPEXY BILATERAL ;  Surgeon: Glenna Fellows, MD;  Location: [facility];  Service: Plastics;  Laterality: Bilateral;    Allergies: Allergies as of 04/21/2023 - Review Complete 04/21/2023  Allergen Reaction Noted   Ciprofloxacin Nausea And Vomiting and Hives 05/07/2014   Codeine Anaphylaxis, Hives, and Other (See Comments) 11/20/2013   Fluconazole Hives 05/07/2014   Latex Anaphylaxis and Rash 11/20/2013   Meperidine Anaphylaxis and Hives 11/20/2013   Morphine and related Hives, Anaphylaxis, and Other (See Comments) 01/15/2010   Doxycycline Nausea And Vomiting 12/27/2019   Flagyl [metronidazole] Nausea And Vomiting 04/29/2015   Influenza vaccines Swelling 02/04/2016   Tape Other (See Comments) 10/01/2016   Valtrex [valacyclovir hcl] Hives 03/06/2018   Buprenorphine  04/28/2015   Duloxetine Other (See Comments) 05/07/2014   Silicone Other (See Comments) 10/01/2016   Tramadol  10/06/2022   Amoxicillin-pot clavulanate Nausea And Vomiting 05/07/2014   Sulfamethoxazole-trimethoprim Nausea And Vomiting and Rash 07/23/2014   Valacyclovir Rash 04/29/2015    Medications:  Current Outpatient Medications:    acetaminophen (TYLENOL) 500 MG tablet, Take 1,000 mg by mouth every 6 (six) hours as needed., Disp: , Rfl:    amiodarone (PACERONE) 100 MG tablet, Take 1 tablet (100 mg total) by mouth daily., Disp: 30 tablet, Rfl: 0   ascorbic acid (VITAMIN C) 500 MG tablet, Take 1 tablet (500 mg total) by mouth daily., Disp:  30 tablet, Rfl: 0   calcium carbonate (TUMS - DOSED IN MG ELEMENTAL CALCIUM) 500 MG chewable tablet, Chew 1 tablet by mouth 3 (three) times daily as needed for indigestion or heartburn., Disp: , Rfl:    diphenhydrAMINE-zinc acetate (BENADRYL) cream, Apply topically 3 (three) times daily as needed for itching., Disp: 28.4 g, Rfl: 0   feeding supplement (ENSURE ENLIVE / ENSURE PLUS) LIQD, Take 237 mLs by mouth 2 (two) times daily between meals., Disp: 237 mL, Rfl: 12   folic acid (FOLVITE) 1 MG tablet, Take 1 tablet (1 mg total) by mouth daily., Disp: 30 tablet, Rfl: 0   gabapentin (NEURONTIN) 100 MG capsule, Two tabs po qhs for two more nights then three tabs po qhs afterwards, Disp: 90 capsule, Rfl: 0   HYDROcodone-acetaminophen (NORCO/VICODIN) 5-325 MG tablet, Take 1 tablet by mouth every 8 (eight) hours as needed for severe pain., Disp: 12 tablet, Rfl: 0   lidocaine (LIDODERM) 5 %, Place 1 patch onto the skin daily. Remove & Discard patch within 12 hours or as directed by MD. Apply to back., Disp: ,  Rfl: 0   lidocaine (XYLOCAINE) 2 % solution, Use as directed 15 mLs in the mouth or throat 4 (four) times daily -  before meals and at bedtime., Disp: 200 mL, Rfl: 0   loratadine (CLARITIN) 10 MG tablet, Take 1 tablet (10 mg total) by mouth daily., Disp: 30 tablet, Rfl: 0   metoprolol tartrate (LOPRESSOR) 25 MG tablet, Take 0.5 tablets (12.5 mg total) by mouth 2 (two) times daily., Disp: 30 tablet, Rfl: 0   Multiple Vitamin (MULTIVITAMIN WITH MINERALS) TABS tablet, Take 1 tablet by mouth daily., Disp: 30 tablet, Rfl: 0   pantoprazole (PROTONIX) 40 MG tablet, Take 1 tablet (40 mg total) by mouth daily., Disp: 30 tablet, Rfl: 0   traZODone (DESYREL) 50 MG tablet, Take 0.5 tablets (25 mg total) by mouth at bedtime as needed for sleep., Disp: 15 tablet, Rfl: 0  Social History: Social History   Tobacco Use   Smoking status: Former    Packs/day: 1.00    Years: 40.00    Additional pack years: 0.00     Total pack years: 40.00    Types: Cigarettes    Quit date: 08/28/2005    Years since quitting: 17.6   Smokeless tobacco: Never  Vaping Use   Vaping Use: Never used  Substance Use Topics   Alcohol use: Not Currently   Drug use: No    Family Medical History: Family History  Problem Relation Age of Onset   Atrial fibrillation Mother    Atrial fibrillation Sister    Cancer Sister    Diabetes Sister    Breast cancer Sister 38   Diabetes Father    Cancer Sister    Diabetes Sister    Atrial fibrillation Sister    Kidney disease Maternal Aunt     Physical Examination: Vitals:   04/21/23 1357  BP: 120/70    General: Patient is in no apparent distress. Attention to examination is appropriate.  Neck:   Supple.  Full range of motion.  Respiratory: Patient is breathing without any difficulty.   NEUROLOGICAL:     Awake, alert, oriented to person, place, and time.  Speech is clear and fluent.   Cranial Nerves: Pupils equal round and reactive to light.  Facial tone is symmetric.  Facial sensation is symmetric. Shoulder shrug is symmetric. Tongue protrusion is midline.  There is no pronator drift.  Strength: Side Biceps Triceps Deltoid Interossei Grip Wrist Ext. Wrist Flex.  R 5 5 5 5 5 5 5   L 5 5 5 5 5 5 5    Side Iliopsoas Quads Hamstring PF DF EHL  R 5 5 5 5 5 5   L 5 5 5 5 5 5     Bilateral upper and lower extremity sensation is intact to light touch.    No evidence of dysmetria noted.  Gait is normal.     Medical Decision Making  Imaging: CT Head 04/20/2023 IMPRESSION: 1. Interval resolution of left temporal intraparenchymal hematoma. 2. Interval decrease in size of right temporal intraparenchymal hematoma. 3. Interval resolution of left cerebral convexity subdural hematoma. 4. Interval decrease in size of left tentorial subdural hematoma. 5. Tiny residual focus of subarachnoid hemorrhage adjacent to the left temporal lobe. 6. No interval hemorrhage. No mass  effect or midline shift. 7. Mild senescent changes.     Electronically Signed   By: Helyn Numbers M.D.   On: 04/20/2023 04:16  I have personally reviewed the images and agree with the above interpretation.  Assessment and Plan:  Ms. Anelli is a pleasant 77 y.o. female with history of traumatic injury with intracranial hemorrhage.  That is resolved.  She continues to have headache disorder.  At this does not improve over the next 2 to 4 weeks, I would recommend reevaluation by Dr. Malvin Johns neurology.  I will give her exercises for her neck.  She has physical therapy coming to the house currently.  I have asked that she show these exercises to the physical therapist.  I will see her back on an as-needed basis.     Thank you for involving me in the care of this patient.      Narjis Mira K. Myer Haff MD, [facility]

## 2023-04-22 ENCOUNTER — Inpatient Hospital Stay: Payer: PPO | Admitting: Internal Medicine

## 2023-04-24 NOTE — Progress Notes (Unsigned)
Established Patient Office Visit  Subjective    Patient ID: Donna Evans, female    DOB: 03-05-1946  Age: 77 y.o. MRN: [id number]  CC:  No chief complaint on file.   HPI Donna Evans presents for hospital follow up.   Discharge Date: 03/29/23 Diagnosis: subdural hematoma, frequent falls, cellulitis  Procedures/tests: CT head and brain MRI showing subdural hematoma and subarachnoid hemorrhage. EEG negative Consultants: Neurology, Neurosurgery, PT/OT New medications: Keflex, Gabapentin, thiamine Discontinued medications: Eliquis held for 2 weeks  Discharge instructions:  *** Status: {Blank multiple:19196::"better","worse","stable","fluctuating"}  Presented after multiple falls at home and AMS.   A.Fib/CHF/Hypotension: -Medications: Amiodarone 200 mg, Eliquis 5 mg BID, Metoprolol 50 mg TID, Midodrine 2.5 mg -Patient is compliant with above medications and reports no side effects. -Checking BP at home (average): 132/82, randomly low at 100-90/40-60 -Denies any SOB, chest pain but does have dizzy spells, pre-syncope and increased confusion -Following with Cardiology, last seen 12/23/22 -Last echo 4/23 showing EF 55% -Patient does ambulate with a cane outside the home and a walker inside the home. Patient does live alone.   HLD: -Medications: Nothing currently - had been on Crestor  -Last lipid panel: Lipid Panel     Component Value Date/Time   CHOL 111 04/29/2022 0504   TRIG 114 04/29/2022 0504   HDL 42 04/29/2022 0504   CHOLHDL 2.6 04/29/2022 0504   VLDL 23 04/29/2022 0504   LDLCALC 46 04/29/2022 0504   Venous Insufficiency/PAD: -Following with Vascular, last seen 07/07/2022 with abnormal ABIs at the time.  CKD3a/AoCD: -Creatinine 1.21, GFR 46 11/17/22, Hgb 11.5 -Following with Nephrology, last seen 08/23/22  Anxiety/Insomnia:  -Currently on Xanax 1 mg, Ambien 10 mg and muscle relaxers at night to help her sleep. -Started the Xanax in 05-29-2004 when her son passed away  and has been on it since   Outpatient Encounter Medications as of 04/25/2023  Medication Sig   acetaminophen (TYLENOL) 500 MG tablet Take 1,000 mg by mouth every 6 (six) hours as needed.   amiodarone (PACERONE) 100 MG tablet Take 1 tablet (100 mg total) by mouth daily.   ascorbic acid (VITAMIN C) 500 MG tablet Take 1 tablet (500 mg total) by mouth daily.   calcium carbonate (TUMS - DOSED IN MG ELEMENTAL CALCIUM) 500 MG chewable tablet Chew 1 tablet by mouth 3 (three) times daily as needed for indigestion or heartburn.   diphenhydrAMINE-zinc acetate (BENADRYL) cream Apply topically 3 (three) times daily as needed for itching.   feeding supplement (ENSURE ENLIVE / ENSURE PLUS) LIQD Take 237 mLs by mouth 2 (two) times daily between meals.   folic acid (FOLVITE) 1 MG tablet Take 1 tablet (1 mg total) by mouth daily.   gabapentin (NEURONTIN) 100 MG capsule Two tabs po qhs for two more nights then three tabs po qhs afterwards   HYDROcodone-acetaminophen (NORCO/VICODIN) 5-325 MG tablet Take 1 tablet by mouth every 8 (eight) hours as needed for severe pain.   lidocaine (LIDODERM) 5 % Place 1 patch onto the skin daily. Remove & Discard patch within 12 hours or as directed by MD. Apply to back.   lidocaine (XYLOCAINE) 2 % solution Use as directed 15 mLs in the mouth or throat 4 (four) times daily -  before meals and at bedtime.   loratadine (CLARITIN) 10 MG tablet Take 1 tablet (10 mg total) by mouth daily.   metoprolol tartrate (LOPRESSOR) 25 MG tablet Take 0.5 tablets (12.5 mg total) by mouth 2 (two) times daily.  Multiple Vitamin (MULTIVITAMIN WITH MINERALS) TABS tablet Take 1 tablet by mouth daily.   pantoprazole (PROTONIX) 40 MG tablet Take 1 tablet (40 mg total) by mouth daily.   traZODone (DESYREL) 50 MG tablet Take 0.5 tablets (25 mg total) by mouth at bedtime as needed for sleep.   No facility-administered encounter medications on file as of 04/25/2023.    Past Medical History:  Diagnosis  Date   Allergic state    Anginal pain (HCC)    Prinzmetal's angina   Anxiety    Arthritis    osteoarthritis   Breast cancer (HCC) 1990   right breast cancer   Chronic pain    Chronic pain    COPD (chronic obstructive pulmonary disease) (HCC)    Depression    Edema    Fibromyalgia    Foot fracture    Bilateral   GERD (gastroesophageal reflux disease)    History of kidney stones    Hypertension    Leg fracture, right    Low back pain    Lumbosacral neuritis    Medulloadrenal hyperfunction (HCC)    Migraine headache    Peripheral neuropathy    Shoulder fracture, right    Stroke [facility])    TIA   Systemic lupus erythematosus (HCC)    Thyroid disease    TIA (transient ischemic attack) 11/28/2013   Transient global amnesia 2011    Past Surgical History:  Procedure Laterality Date   ABDOMINAL HYSTERECTOMY  1981   APPENDECTOMY  1957   AUGMENTATION MAMMAPLASTY Bilateral 1990   saline submuscular   BACK SURGERY  1989   BILATERAL TOTAL MASTECTOMY WITH AXILLARY LYMPH NODE DISSECTION  1990   BREAST IMPLANT EXCHANGE Bilateral 05/11/2016   Procedure: REMOVAL AND REPLACEMENT OF BREAST IMPLANTS ;  Surgeon: Glenna Fellows, MD;  Location: [facility];  Service: Plastics;  Laterality: Bilateral;   CAPSULECTOMY Bilateral 05/11/2016   Procedure: CAPSULECTOMY CAPSULORRAPHY ;  Surgeon: Glenna Fellows, MD;  Location: [facility];  Service: Plastics;  Laterality: Bilateral;   CATARACT EXTRACTION W/PHACO Right 12/26/2018   Procedure: CATARACT EXTRACTION PHACO AND INTRAOCULAR LENS PLACEMENT (IOC) RIGHT;  Surgeon: Galen Manila, MD;  Location: [facility];  Service: Ophthalmology;  Laterality: Right;  [place]  00:46 CDE 8.50 Fluid pack lot # [id number]   CATARACT EXTRACTION W/PHACO Left 01/23/2019   Procedure: CATARACT EXTRACTION PHACO AND INTRAOCULAR LENS PLACEMENT (IOC) LEFT;  Surgeon: Galen Manila, MD;  Location: [facility];  Service: Ophthalmology;  Laterality: Left;   [place]  00:45 CDE  8.41 fluid pack lot # [id number]   CHOLECYSTECTOMY  1979   COLONOSCOPY WITH PROPOFOL N/A 01/25/2018   Procedure: COLONOSCOPY WITH PROPOFOL;  Surgeon: Scot Jun, MD;  Location: [facility];  Service: Endoscopy;  Laterality: N/A;   COPD     ESOPHAGOGASTRODUODENOSCOPY (EGD) WITH PROPOFOL N/A 03/07/2018   Procedure: ESOPHAGOGASTRODUODENOSCOPY (EGD) WITH PROPOFOL;  Surgeon: Scot Jun, MD;  Location: [facility];  Service: Endoscopy;  Laterality: N/A;   FRACTURE SURGERY     HARDWARE REMOVAL Left 06/18/2020   Procedure: HARDWARE REMOVAL;  Surgeon: Lyndle Herrlich, MD;  Location: [facility];  Service: Orthopedics;  Laterality: Left;   HUMERUS IM NAIL Left 12/31/2019   Procedure: INTRAMEDULLARY (IM) NAIL HUMERAL;  Surgeon: Lyndle Herrlich, MD;  Location: [facility];  Service: Orthopedics;  Laterality: Left;   LIPOSUCTION Right 05/11/2016   Procedure: LIPOSUCTION;  Surgeon: Glenna Fellows, MD;  Location: [facility];  Service: Plastics;  Laterality: Right;  LIVER BIOPSY  2011   MASTECTOMY SUBCUTANEOUS Bilateral 1990   MASTOPEXY Bilateral 05/11/2016   Procedure: MASTOPEXY BILATERAL ;  Surgeon: Glenna Fellows, MD;  Location: [facility];  Service: Plastics;  Laterality: Bilateral;    Family History  Problem Relation Age of Onset   Atrial fibrillation Mother    Atrial fibrillation Sister    Cancer Sister    Diabetes Sister    Breast cancer Sister 34   Diabetes Father    Cancer Sister    Diabetes Sister    Atrial fibrillation Sister    Kidney disease Maternal Aunt     Social History   Socioeconomic History   Marital status: Widowed    Spouse name: Not on file   Number of children: 1   Years of education: college3   Highest education level: Not on file  Occupational History   Occupation: Retired  Tobacco Use   Smoking status: Former    Packs/day: 1.00    Years: 40.00    Additional pack years: 0.00    Total pack years: 40.00     Types: Cigarettes    Quit date: 08/28/2005    Years since quitting: 17.6   Smokeless tobacco: Never  Vaping Use   Vaping Use: Never used  Substance and Sexual Activity   Alcohol use: Not Currently   Drug use: No   Sexual activity: Never  Other Topics Concern   Not on file  Social History Narrative   Not on file   Social Determinants of Health   Financial Resource Strain: Not on file  Food Insecurity: No Food Insecurity (03/24/2023)   Hunger Vital Sign    Worried About Running Out of Food in the Last Year: Never true    Ran Out of Food in the Last Year: Never true  Transportation Needs: No Transportation Needs (03/24/2023)   PRAPARE - Administrator, Civil Service (Medical): No    Lack of Transportation (Non-Medical): No  Physical Activity: Not on file  Stress: Not on file  Social Connections: Not on file  Intimate Partner Violence: Not At Risk (03/24/2023)   Humiliation, Afraid, Rape, and Kick questionnaire    Fear of Current or Ex-Partner: No    Emotionally Abused: No    Physically Abused: No    Sexually Abused: No    Review of Systems  Constitutional:  Positive for malaise/fatigue. Negative for chills and fever.  Respiratory:  Negative for shortness of breath.   Cardiovascular:  Negative for chest pain.  Neurological:  Positive for dizziness and loss of consciousness.  Psychiatric/Behavioral:  The patient is nervous/anxious and has insomnia.         Objective    There were no vitals taken for this visit.  Physical Exam Constitutional:      Appearance: Normal appearance.     Comments: Walking with a walker  HENT:     Head: Normocephalic and atraumatic.  Eyes:     Conjunctiva/sclera: Conjunctivae normal.  Cardiovascular:     Rate and Rhythm: Normal rate. Rhythm irregular.  Pulmonary:     Effort: Pulmonary effort is normal.     Breath sounds: Normal breath sounds.  Musculoskeletal:        General: Swelling present.     Comments: Swelling in  BLE  Skin:    General: Skin is dry.     Comments: Cool, dry and darkly discolored lower extremities   Neurological:     General: No focal deficit present.  Mental Status: She is alert. Mental status is at baseline.  Psychiatric:        Mood and Affect: Mood normal.        Behavior: Behavior normal.         Assessment & Plan:   1. Paroxysmal atrial fibrillation with RVR (HCC)/Chronic congestive heart failure, unspecified heart failure type [facility]): All blood pressure medications have been discontinued for the patient while she was in the hospital due to orthostatic hypotension.  Blood pressure appropriate here today.  She is currently on amiodarone 200 mg, metoprolol 50 mg 3 times daily and Eliquis 5 mg twice daily for her atrial fibrillation.  Last echo reviewed from April 2023, showing an EF of 55%.  She does follow with cardiology, note reviewed from 12/23/2022.  If she ends up establishing care here, will place a CCM referral to pharmacy to help with cost of Eliquis.  2. Orthostatic hypotension: Discussed with the patient she needs to stand and sit up slowly and take her time while walking.  She does have a small dog at home but no other environmental risk factors for falling.  She does ambulate with a walker at home and a cane outside of her home.  She is on midodrine per cardiology 2.5 mg.  3. Venous insufficiency: Following with vascular surgery, note reviewed from 07/07/2022.  She did have abnormal ABIs at that office visit and has a follow-up appointment in the next couple weeks to discuss.  She is on Eliquis.  4. Stage 3a chronic kidney disease (HCC): Following with nephrology, labs and note reviewed from 11/17/2022.  Discussed continuing to monitor and avoiding all nephrotoxic agents including anti-inflammatories.  5. Generalized anxiety disorder/ Insomnia, unspecified type: I had a long conversation with the patient that if she establishes care here, I will not continue her Xanax  or Ambien.  I would either have her see psychiatry or work on a weaning plan to get her off the substances.  I do think her taking 1 mg of Xanax, Ambien and muscle relaxers at night are contributing to her balance issues, dizziness and confusion/memory loss.  6. Need for influenza vaccination: Flu vaccine administered today.  - Flu Vaccine QUAD High Dose(Fluad)   No follow-ups on file.  - once she decides if she wants to establish care here.   Margarita Mail, DO

## 2023-04-25 ENCOUNTER — Encounter: Payer: Self-pay | Admitting: Internal Medicine

## 2023-04-25 ENCOUNTER — Ambulatory Visit (INDEPENDENT_AMBULATORY_CARE_PROVIDER_SITE_OTHER): Payer: PPO | Admitting: Internal Medicine

## 2023-04-25 VITALS — BP 112/70 | HR 61 | Temp 98.2°F | Resp 16 | Ht 67.0 in | Wt 100.5 lb

## 2023-04-25 DIAGNOSIS — G4700 Insomnia, unspecified: Secondary | ICD-10-CM | POA: Diagnosis not present

## 2023-04-25 DIAGNOSIS — R627 Adult failure to thrive: Secondary | ICD-10-CM

## 2023-04-25 DIAGNOSIS — M5441 Lumbago with sciatica, right side: Secondary | ICD-10-CM | POA: Diagnosis not present

## 2023-04-25 DIAGNOSIS — R110 Nausea: Secondary | ICD-10-CM

## 2023-04-25 DIAGNOSIS — I872 Venous insufficiency (chronic) (peripheral): Secondary | ICD-10-CM

## 2023-04-25 DIAGNOSIS — I482 Chronic atrial fibrillation, unspecified: Secondary | ICD-10-CM

## 2023-04-25 DIAGNOSIS — M5442 Lumbago with sciatica, left side: Secondary | ICD-10-CM | POA: Diagnosis not present

## 2023-04-25 DIAGNOSIS — R296 Repeated falls: Secondary | ICD-10-CM | POA: Diagnosis not present

## 2023-04-25 DIAGNOSIS — I10 Essential (primary) hypertension: Secondary | ICD-10-CM

## 2023-04-25 DIAGNOSIS — Z09 Encounter for follow-up examination after completed treatment for conditions other than malignant neoplasm: Secondary | ICD-10-CM

## 2023-04-25 DIAGNOSIS — I5032 Chronic diastolic (congestive) heart failure: Secondary | ICD-10-CM

## 2023-04-25 DIAGNOSIS — S065XAA Traumatic subdural hemorrhage with loss of consciousness status unknown, initial encounter: Secondary | ICD-10-CM | POA: Diagnosis not present

## 2023-04-25 MED ORDER — GABAPENTIN 100 MG PO CAPS
100.0000 mg | ORAL_CAPSULE | Freq: Two times a day (BID) | ORAL | 0 refills | Status: DC
Start: 2023-04-25 — End: 2023-05-12

## 2023-04-25 MED ORDER — FOLIC ACID 1 MG PO TABS: 1.0000 mg | ORAL_TABLET | Freq: Every day | ORAL | 0 refills | Status: DC

## 2023-04-25 MED ORDER — ADULT MULTIVITAMIN W/MINERALS CH: 1.0000 | ORAL_TABLET | Freq: Every day | ORAL | 0 refills | Status: DC

## 2023-04-25 MED ORDER — ASCORBIC ACID 500 MG PO TABS
500.0000 mg | ORAL_TABLET | Freq: Every day | ORAL | 0 refills | Status: DC
Start: 2023-04-25 — End: 2024-02-11

## 2023-04-25 MED ORDER — ONDANSETRON HCL 4 MG PO TABS: 4.0000 mg | ORAL_TABLET | Freq: Three times a day (TID) | ORAL | 0 refills | Status: DC | PRN

## 2023-04-25 MED ORDER — TRAZODONE HCL 100 MG PO TABS
100.0000 mg | ORAL_TABLET | Freq: Every day | ORAL | 0 refills | Status: DC
Start: 2023-04-25 — End: 2023-05-10

## 2023-04-25 NOTE — Patient Instructions (Addendum)
It was great seeing you today!  Plan discussed at today's visit: -Trazodone increased to 100 mg at night for sleep -Gabapentin increased to 100 mg twice a day for nerve pain -Referral to palliative care placed -Discuss with Cardiology about heart medications and if a blood thinner is needed -Call to make appointment with vascular to discuss legs   Follow up in: 1 month   Take care and let us know if you have any questions or concerns prior to your next visit.  Dr. Caralee Ates

## 2023-04-26 ENCOUNTER — Telehealth: Payer: Self-pay | Admitting: Internal Medicine

## 2023-04-26 ENCOUNTER — Other Ambulatory Visit: Payer: Self-pay

## 2023-04-26 DIAGNOSIS — Z515 Encounter for palliative care: Secondary | ICD-10-CM

## 2023-04-26 NOTE — Telephone Encounter (Signed)
Copied from CRM [id number]. Topic: General - Other >> Apr 26, 2023 10:08 AM Everette C wrote: Reason for CRM: Medication Refill - Medication: cyclobenzaprine (FLEXERIL) 10 MG tablet [[id number]]  Has the patient contacted their pharmacy? Yes.   (Agent: If no, request that the patient contact the pharmacy for the refill. If patient does not wish to contact the pharmacy document the reason why and proceed with request.) (Agent: If yes, when and what did the pharmacy advise?)  Preferred Pharmacy (with phone number or street name): [facility] - [place], [place] - [address] [place] [address] Phone: [phone] Fax: [phone] Hours: Not open 24 hours   Has the patient been seen for an appointment in the last year OR does the patient have an upcoming appointment? Yes.    Agent: Please be advised that RX refills may take up to 3 business days. We ask that you follow-up with your pharmacy.

## 2023-04-27 NOTE — Progress Notes (Signed)
TELEPHONE ENCOUNTER  Palliative care SW connected with patient to review and discuss new PC referral.  Patient shared that she was interested in [facility] services to see what type of support can be offered. Patient shared that she has had neuro workup and surgery that has left her with having a constant dull headache as well as body pain all over. Patient is interested in assistance with pain management, of which [facility] palliative care is unable to provide. Patient share that her pain is impeding on her daily life functions like driving or goingto the grocery. Patient shares that her family (niece and nephew) are supportive and assist her due to her spouse and only child being deceased.  Plan: in person PC visit scheduled for 5/23 @1pm .

## 2023-05-02 DIAGNOSIS — R296 Repeated falls: Secondary | ICD-10-CM | POA: Diagnosis not present

## 2023-05-02 DIAGNOSIS — I10 Essential (primary) hypertension: Secondary | ICD-10-CM | POA: Diagnosis not present

## 2023-05-02 DIAGNOSIS — I609 Nontraumatic subarachnoid hemorrhage, unspecified: Secondary | ICD-10-CM | POA: Diagnosis not present

## 2023-05-02 DIAGNOSIS — R0602 Shortness of breath: Secondary | ICD-10-CM | POA: Diagnosis not present

## 2023-05-02 DIAGNOSIS — I480 Paroxysmal atrial fibrillation: Secondary | ICD-10-CM | POA: Diagnosis not present

## 2023-05-02 DIAGNOSIS — S065XAA Traumatic subdural hemorrhage with loss of consciousness status unknown, initial encounter: Secondary | ICD-10-CM | POA: Diagnosis not present

## 2023-05-02 DIAGNOSIS — I872 Venous insufficiency (chronic) (peripheral): Secondary | ICD-10-CM | POA: Diagnosis not present

## 2023-05-03 ENCOUNTER — Ambulatory Visit: Payer: Self-pay | Admitting: *Deleted

## 2023-05-03 ENCOUNTER — Telehealth (INDEPENDENT_AMBULATORY_CARE_PROVIDER_SITE_OTHER): Payer: Self-pay | Admitting: Nurse Practitioner

## 2023-05-03 NOTE — Telephone Encounter (Signed)
Pt LVM yesterday (5.20.24) and asked [place] to return call to R/S an appt. I LVM for pt TCB and schedule appt. It looks like the January appt was missed. We will await a return call from patient.

## 2023-05-03 NOTE — Telephone Encounter (Signed)
Chief Complaint: constipation  Symptoms: no regular BM x 15 days. Small hard balls "like milk duds". Hx IBS. Required digital removal with finger by patient to remove a few hard pieces of stool. Nausea. Abdominal discomfort same as always. Rectal pain and fullness. Blood noted on tissue .  Has tried enema and unsuccessful inserting due to full of stool. Can tolerate eating and drinking  Frequency: 15 days  Pertinent Negatives: Patient denies vomiting ,  Disposition: [] ED /[] Urgent Care (no appt availability in office) / [] Appointment(In office/virtual)/ []  [place] [facility]/ [] Home Care/ [x] Refused Recommended Disposition /[]  Mobile Bus/ []  Follow-up with PCP Additional Notes:   Recommended warm prune juice, warm fluids, full liquid diet if worsening nausea or vomiting occurs. Recommended to be evaluated and patient refuses ED and declined appt for tomorrow when offered.  Please advise .   Reason for Disposition  [1] Rectal pain or fullness from fecal impaction (rectum full of stool) AND [2] NOT better after SITZ bath, suppository or enema  Answer Assessment - Initial Assessment Questions 1. STOOL PATTERN OR FREQUENCY: "How often do you have a bowel movement (BM)?"  (Normal range: 3 times a day to every 3 days)  "When was your last BM?"       Atleast 3 days  2. STRAINING: "Do you have to strain to have a BM?"      Yes  3. RECTAL PAIN: "Does your rectum hurt when the stool comes out?" If Yes, ask: "Do you have hemorrhoids? How bad is the pain?"  (Scale 1-10; or mild, moderate, severe)     Pain with sitting  4. STOOL COMPOSITION: "Are the stools hard?"      Yes "like milk duds" 5. BLOOD ON STOOLS: "Has there been any blood on the toilet tissue or on the surface of the BM?" If Yes, ask: "When was the last time?"     Blood on tissue 6. CHRONIC CONSTIPATION: "Is this a new problem for you?"  If No, ask: "How long have you had this problem?" (days, weeks, months)      15 days   7. CHANGES IN DIET OR HYDRATION: "Have there been any recent changes in your diet?" "How much fluids are you drinking on a daily basis?"  "How much have you had to drink today?"     Nausea  8. MEDICINES: "Have you been taking any new medicines?" "Are you taking any narcotic pain medicines?" (e.g., Dilaudid, morphine, Percocet, Vicodin)     No changes  9. LAXATIVES: "Have you been using any stool softeners, laxatives, or enemas?"  If Yes, ask "What, how often, and when was the last time?"     Stool softener, has tried enema and can no insert very far due to hard stool 10. ACTIVITY:  "How much walking do you do every day?"  "Has your activity level decreased in the past week?"        Uses walker  11. CAUSE: "What do you think is causing the constipation?"        Hx IBS 12. OTHER SYMPTOMS: "Do you have any other symptoms?" (e.g., abdomen pain, bloating, fever, vomiting)       Nausea, rectal pain  13. MEDICAL HISTORY: "Do you have a history of hemorrhoids, rectal fissures, or rectal surgery or rectal abscess?"         No  14. PREGNANCY: "Is there any chance you are pregnant?" "When was your last menstrual period?"       na  Protocols used: Constipation-A-AH

## 2023-05-04 NOTE — Telephone Encounter (Signed)
She states took duclolax, I told her to get mirlax and take daily. Pt will schedule appt if does not get better

## 2023-05-05 ENCOUNTER — Other Ambulatory Visit: Payer: Self-pay

## 2023-05-05 DIAGNOSIS — Z515 Encounter for palliative care: Secondary | ICD-10-CM

## 2023-05-05 NOTE — Progress Notes (Signed)
COMMUNITY PALLIATIVE CARE SW NOTE  PATIENT NAME: Donna Evans DOB: 1946-03-18 MRN: [id number]  PRIMARY CARE PROVIDER: Margarita Mail, DO  RESPONSIBLE PARTY:  Acct ID - Guarantor Home Phone Work Phone Relationship Acct Type  [id number] Donna Evans, Donna Evans* [phone]  Self P/F     [address] [place], [place] [address]     PLAN OF CARE and INTERVENTIONS:              Initial Palliative care encounter: SW met with patient, patients nephew [name], patients companion Hal, Patients son Asher Muir and patients friend [name].  Goal: manage pain appropriately and be able to do physical activities she was able to do before, as well as maintain quality of life.   Patient family/friends provided background and medical hx of patients medical concerns to include renal failure, fibromyalgia, lymph edema, and chronic pain. Patients presenting problem/concern is her chronic pain and poor sleep, she is experiencing due to pain.   Palliative care criteria and services discussed and reviewed. Patient and family/friends made aware that palliative care is a consultative program and is unable to manage or write prescriptions for pain management. Patient endorsed that [facility] palliative care program can not meet her needs if pain management can not be achieved by managing pain medications. SW advised patient that there may be other PC programs that has providers that can manage pain medications and address this presenting issue. Sw and family/friends also discussed the possibility of being involved with a pain management clinic.   Patient and family/friend concluded that [facility] palliative care is not what they are needing at this time but are open to a different PC program that can have more hands on support with pain management. SW will make referring provider aware and will DC referral.      SOCIAL HX:  Social History   Tobacco Use   Smoking status: Former    Packs/day: 1.00    Years: 40.00     Additional pack years: 0.00    Total pack years: 40.00    Types: Cigarettes    Quit date: 08/28/2005    Years since quitting: 17.6   Smokeless tobacco: Never  Substance Use Topics   Alcohol use: Not Currently    CODE STATUS: full code ADVANCED DIRECTIVES: N MOST FORM COMPLETE:  N HOSPICE EDUCATION PROVIDED: N  PPS: 40%  TIME SPENT: 50 MIN      [place], [place]

## 2023-05-06 ENCOUNTER — Ambulatory Visit: Payer: Self-pay

## 2023-05-06 DIAGNOSIS — F1020 Alcohol dependence, uncomplicated: Secondary | ICD-10-CM | POA: Diagnosis not present

## 2023-05-06 DIAGNOSIS — K589 Irritable bowel syndrome without diarrhea: Secondary | ICD-10-CM | POA: Diagnosis not present

## 2023-05-06 DIAGNOSIS — Z8673 Personal history of transient ischemic attack (TIA), and cerebral infarction without residual deficits: Secondary | ICD-10-CM | POA: Diagnosis not present

## 2023-05-06 DIAGNOSIS — E876 Hypokalemia: Secondary | ICD-10-CM | POA: Diagnosis not present

## 2023-05-06 DIAGNOSIS — K219 Gastro-esophageal reflux disease without esophagitis: Secondary | ICD-10-CM | POA: Diagnosis not present

## 2023-05-06 DIAGNOSIS — L03115 Cellulitis of right lower limb: Secondary | ICD-10-CM | POA: Diagnosis not present

## 2023-05-06 DIAGNOSIS — R339 Retention of urine, unspecified: Secondary | ICD-10-CM | POA: Diagnosis not present

## 2023-05-06 DIAGNOSIS — M8008XD Age-related osteoporosis with current pathological fracture, vertebra(e), subsequent encounter for fracture with routine healing: Secondary | ICD-10-CM | POA: Diagnosis not present

## 2023-05-06 DIAGNOSIS — I5020 Unspecified systolic (congestive) heart failure: Secondary | ICD-10-CM | POA: Diagnosis not present

## 2023-05-06 DIAGNOSIS — N1832 Chronic kidney disease, stage 3b: Secondary | ICD-10-CM | POA: Diagnosis not present

## 2023-05-06 DIAGNOSIS — Z9181 History of falling: Secondary | ICD-10-CM | POA: Diagnosis not present

## 2023-05-06 DIAGNOSIS — M800AXD Age-related osteoporosis with current pathological fracture, other site, subsequent encounter for fracture with routine healing: Secondary | ICD-10-CM | POA: Diagnosis not present

## 2023-05-06 DIAGNOSIS — I130 Hypertensive heart and chronic kidney disease with heart failure and stage 1 through stage 4 chronic kidney disease, or unspecified chronic kidney disease: Secondary | ICD-10-CM | POA: Diagnosis not present

## 2023-05-06 DIAGNOSIS — G894 Chronic pain syndrome: Secondary | ICD-10-CM | POA: Diagnosis not present

## 2023-05-06 DIAGNOSIS — E46 Unspecified protein-calorie malnutrition: Secondary | ICD-10-CM | POA: Diagnosis not present

## 2023-05-06 DIAGNOSIS — M797 Fibromyalgia: Secondary | ICD-10-CM | POA: Diagnosis not present

## 2023-05-06 DIAGNOSIS — S065X9D Traumatic subdural hemorrhage with loss of consciousness of unspecified duration, subsequent encounter: Secondary | ICD-10-CM | POA: Diagnosis not present

## 2023-05-06 DIAGNOSIS — G4700 Insomnia, unspecified: Secondary | ICD-10-CM | POA: Diagnosis not present

## 2023-05-06 DIAGNOSIS — I480 Paroxysmal atrial fibrillation: Secondary | ICD-10-CM | POA: Diagnosis not present

## 2023-05-06 DIAGNOSIS — I951 Orthostatic hypotension: Secondary | ICD-10-CM | POA: Diagnosis not present

## 2023-05-06 DIAGNOSIS — S065X0D Traumatic subdural hemorrhage without loss of consciousness, subsequent encounter: Secondary | ICD-10-CM | POA: Diagnosis not present

## 2023-05-06 DIAGNOSIS — F411 Generalized anxiety disorder: Secondary | ICD-10-CM | POA: Diagnosis not present

## 2023-05-06 NOTE — Telephone Encounter (Signed)
Pt was informed at new patient appointment we do not prescribe pain meds.

## 2023-05-06 NOTE — Telephone Encounter (Signed)
Patient states that [facility] does not give prescriptions. Patient states that she is in pain and needs her medication. Patient would like to know if pcp can send in pain meds.   Chief Complaint: Joint and back pain. States the "palliative care that came out does not prescribe medications." "I need Dr. Caralee Ates to call in Flexeril and call in hydrocone - I need a stronger dose." Please advise pt. Symptoms: Pain Frequency:  Pertinent Negatives: Patient denies n/a Disposition: [] ED /[] Urgent Care (no appt availability in office) / [] Appointment(In office/virtual)/ []  [place] [facility]/ [] Home Care/ [] Refused Recommended Disposition /[] [place] Mobile [facility]/ [x]  Follow-up with PCP Additional Notes: Please advise pt.  Answer Assessment - Initial Assessment Questions 1. NAME of MEDICINE: "What medicine(s) are you calling about?"     Norco and Flexeril 2. QUESTION: "What is your question?" (e.g., double dose of medicine, side effect)     Can PCP call this in 3. PRESCRIBER: "Who prescribed the medicine?" Reason: if prescribed by specialist, call should be referred to that group.      4. SYMPTOMS: "Do you have any symptoms?" If Yes, ask: "What symptoms are you having?"  "How bad are the symptoms (e.g., mild, moderate, severe)     Pain 5. PREGNANCY:  "Is there any chance that you are pregnant?" "When was your last menstrual period?"     No  Protocols used: Medication Question Call-A-AH

## 2023-05-10 ENCOUNTER — Other Ambulatory Visit: Payer: Self-pay

## 2023-05-10 NOTE — Progress Notes (Unsigned)
Focused Pharmacist Outreach   Details of the Visit: She went to the ED due to falls. They made lot of med changes. Last time she had concussion and 3 brain bleeds. She still has memory loss. She is given Phenergan for nausea. She is unable to get any Flexeril, along with xanax and zolpidem. Per Cardiologist, she should be fine on Flexeril since she is on all those heart doctors. Patient is trying to decide if she wants to stay with Dr.TS or change PCP.  Constipation is awful. She is taking MiraLAX. She drinks as much water as she can, more than 8 glasses she has trouble.  She is eating fruit and lot of vitamins (hospitalist added those)  Reconciled medications in the EMR.  Neuropathy in the feet and legs is getting worse. She is getting to swell in her feet. She was referred to vascular doctor but haven't been called for appt. She is weighing 101-103lbs and BP is close to 100/60.  Gave info and recommended patient call Vein and vascular surgery to make appts.  Date of next Pharmacist Follow-up: 06/20/2023 . Lynann Bologna, PharmD  Chart review  Call and documentation 

## 2023-05-11 ENCOUNTER — Other Ambulatory Visit: Payer: Self-pay | Admitting: Internal Medicine

## 2023-05-11 DIAGNOSIS — M5441 Lumbago with sciatica, right side: Secondary | ICD-10-CM

## 2023-05-12 ENCOUNTER — Telehealth: Payer: Self-pay | Admitting: Internal Medicine

## 2023-05-12 NOTE — Telephone Encounter (Signed)
Copied from CRM [id number]. Topic: Referral - Request for Referral >> May 12, 2023  3:32 PM Marlow Baars wrote: Has patient seen PCP for this complaint? Yes.   Referral for which specialty: Hospice and Palliative Care Preferred provider/office: [place] [facility] and Palliative Care Reason for referral: Pain control and to help sleep  There was a referral sent in already but it was too the wrong Palliative Care because they do not offer pain management and she needs it. She needs this to go directly to [facility] and [facility] and Palliative Care at [phone] and the address is [address] [place], [place] [address]. Please assist patient further

## 2023-05-12 NOTE — Telephone Encounter (Signed)
Requested Prescriptions  Pending Prescriptions Disp Refills   gabapentin (NEURONTIN) 100 MG capsule [Pharmacy Med Name: GABAPENTIN 100 MG CAP] 180 capsule 0    Sig: TAKE 1 CAPSULE TWICE DAILY     Neurology: Anticonvulsants - gabapentin Failed - 05/11/2023  3:42 PM      Failed - Cr in normal range and within 360 days    Creatinine  Date Value Ref Range Status  02/29/2012 0.97 0.60 - 1.30 mg/dL Final   Creatinine, Ser  Date Value Ref Range Status  03/29/2023 1.18 (H) 0.44 - 1.00 mg/dL Final   Creatinine, Urine  Date Value Ref Range Status  10/11/2022 89 mg/dL Final         Passed - Completed PHQ-2 or PHQ-9 in the last 360 days      Passed - Valid encounter within last 12 months    Recent Outpatient Visits           2 weeks ago Hospital discharge follow-up   [facility] Margarita Mail, DO   4 months ago Need for influenza vaccination   [facility] Margarita Mail, DO   6 years ago Chronic pain syndrome   [facility] Gabriel Cirri, NP   6 years ago Generalized anxiety disorder   [facility] Alba Cory, MD   6 years ago Hypertension, benign   [facility] Alba Cory, MD       Future Appointments             In 1 week Margarita Mail, DO [facility]   In 3 weeks Sherron Monday, MD [facility]

## 2023-05-13 ENCOUNTER — Other Ambulatory Visit: Payer: Self-pay | Admitting: Internal Medicine

## 2023-05-13 ENCOUNTER — Telehealth: Payer: Self-pay | Admitting: Internal Medicine

## 2023-05-13 DIAGNOSIS — I129 Hypertensive chronic kidney disease with stage 1 through stage 4 chronic kidney disease, or unspecified chronic kidney disease: Secondary | ICD-10-CM | POA: Diagnosis not present

## 2023-05-13 DIAGNOSIS — N189 Chronic kidney disease, unspecified: Secondary | ICD-10-CM | POA: Diagnosis not present

## 2023-05-13 DIAGNOSIS — I10 Essential (primary) hypertension: Secondary | ICD-10-CM

## 2023-05-13 DIAGNOSIS — I482 Chronic atrial fibrillation, unspecified: Secondary | ICD-10-CM

## 2023-05-13 DIAGNOSIS — R296 Repeated falls: Secondary | ICD-10-CM

## 2023-05-13 DIAGNOSIS — S065XAA Traumatic subdural hemorrhage with loss of consciousness status unknown, initial encounter: Secondary | ICD-10-CM

## 2023-05-13 DIAGNOSIS — I5032 Chronic diastolic (congestive) heart failure: Secondary | ICD-10-CM

## 2023-05-13 DIAGNOSIS — E785 Hyperlipidemia, unspecified: Secondary | ICD-10-CM | POA: Diagnosis not present

## 2023-05-13 NOTE — Telephone Encounter (Signed)
Referral placed, Donna Evans notified

## 2023-05-13 NOTE — Telephone Encounter (Signed)
Copied from CRM [id number]. Topic: Referral - Status >> May 13, 2023 12:42 PM Dondra Prader A wrote: Reason for CRM: Toniann Fail pt niece states pt was referred to [facility] care for pallitave care. She had an interview with the social worker from [facility] and was advised that the pt would need to have palliative care with [place].  Toniann Fail is wanting a referral sent to [facility] for Palliative care for the pt. Please advise. The phone number for Genevieve Norlander: [phone]

## 2023-05-17 NOTE — Telephone Encounter (Signed)
Pt called in says was told [place] doesn't have the referral info. Please call back to discuss

## 2023-05-19 DIAGNOSIS — K219 Gastro-esophageal reflux disease without esophagitis: Secondary | ICD-10-CM | POA: Diagnosis not present

## 2023-05-19 DIAGNOSIS — S065X0D Traumatic subdural hemorrhage without loss of consciousness, subsequent encounter: Secondary | ICD-10-CM | POA: Diagnosis not present

## 2023-05-19 DIAGNOSIS — L03115 Cellulitis of right lower limb: Secondary | ICD-10-CM | POA: Diagnosis not present

## 2023-05-19 DIAGNOSIS — F1020 Alcohol dependence, uncomplicated: Secondary | ICD-10-CM | POA: Diagnosis not present

## 2023-05-19 DIAGNOSIS — R339 Retention of urine, unspecified: Secondary | ICD-10-CM | POA: Diagnosis not present

## 2023-05-19 DIAGNOSIS — M8008XD Age-related osteoporosis with current pathological fracture, vertebra(e), subsequent encounter for fracture with routine healing: Secondary | ICD-10-CM | POA: Diagnosis not present

## 2023-05-19 DIAGNOSIS — M800AXD Age-related osteoporosis with current pathological fracture, other site, subsequent encounter for fracture with routine healing: Secondary | ICD-10-CM | POA: Diagnosis not present

## 2023-05-19 DIAGNOSIS — N1832 Chronic kidney disease, stage 3b: Secondary | ICD-10-CM | POA: Diagnosis not present

## 2023-05-19 DIAGNOSIS — M797 Fibromyalgia: Secondary | ICD-10-CM | POA: Diagnosis not present

## 2023-05-19 DIAGNOSIS — S065X9D Traumatic subdural hemorrhage with loss of consciousness of unspecified duration, subsequent encounter: Secondary | ICD-10-CM | POA: Diagnosis not present

## 2023-05-19 DIAGNOSIS — Z8673 Personal history of transient ischemic attack (TIA), and cerebral infarction without residual deficits: Secondary | ICD-10-CM | POA: Diagnosis not present

## 2023-05-19 DIAGNOSIS — K589 Irritable bowel syndrome without diarrhea: Secondary | ICD-10-CM | POA: Diagnosis not present

## 2023-05-19 DIAGNOSIS — E876 Hypokalemia: Secondary | ICD-10-CM | POA: Diagnosis not present

## 2023-05-19 DIAGNOSIS — G4700 Insomnia, unspecified: Secondary | ICD-10-CM | POA: Diagnosis not present

## 2023-05-19 DIAGNOSIS — I5020 Unspecified systolic (congestive) heart failure: Secondary | ICD-10-CM | POA: Diagnosis not present

## 2023-05-19 DIAGNOSIS — I130 Hypertensive heart and chronic kidney disease with heart failure and stage 1 through stage 4 chronic kidney disease, or unspecified chronic kidney disease: Secondary | ICD-10-CM | POA: Diagnosis not present

## 2023-05-19 DIAGNOSIS — G894 Chronic pain syndrome: Secondary | ICD-10-CM | POA: Diagnosis not present

## 2023-05-19 DIAGNOSIS — Z9181 History of falling: Secondary | ICD-10-CM | POA: Diagnosis not present

## 2023-05-19 DIAGNOSIS — I951 Orthostatic hypotension: Secondary | ICD-10-CM | POA: Diagnosis not present

## 2023-05-19 DIAGNOSIS — E46 Unspecified protein-calorie malnutrition: Secondary | ICD-10-CM | POA: Diagnosis not present

## 2023-05-19 DIAGNOSIS — I480 Paroxysmal atrial fibrillation: Secondary | ICD-10-CM | POA: Diagnosis not present

## 2023-05-19 DIAGNOSIS — F411 Generalized anxiety disorder: Secondary | ICD-10-CM | POA: Diagnosis not present

## 2023-05-24 ENCOUNTER — Telehealth: Payer: Self-pay | Admitting: Internal Medicine

## 2023-05-24 ENCOUNTER — Ambulatory Visit (INDEPENDENT_AMBULATORY_CARE_PROVIDER_SITE_OTHER): Payer: PPO | Admitting: Internal Medicine

## 2023-05-24 ENCOUNTER — Encounter: Payer: Self-pay | Admitting: Internal Medicine

## 2023-05-24 VITALS — BP 120/70 | HR 49 | Temp 98.0°F | Resp 16 | Ht 67.0 in | Wt 108.0 lb

## 2023-05-24 DIAGNOSIS — M5442 Lumbago with sciatica, left side: Secondary | ICD-10-CM | POA: Diagnosis not present

## 2023-05-24 DIAGNOSIS — R627 Adult failure to thrive: Secondary | ICD-10-CM | POA: Diagnosis not present

## 2023-05-24 DIAGNOSIS — M5441 Lumbago with sciatica, right side: Secondary | ICD-10-CM | POA: Diagnosis not present

## 2023-05-24 DIAGNOSIS — Z9109 Other allergy status, other than to drugs and biological substances: Secondary | ICD-10-CM

## 2023-05-24 DIAGNOSIS — R296 Repeated falls: Secondary | ICD-10-CM | POA: Diagnosis not present

## 2023-05-24 MED ORDER — CYCLOBENZAPRINE HCL 5 MG PO TABS
5.0000 mg | ORAL_TABLET | Freq: Three times a day (TID) | ORAL | 1 refills | Status: DC | PRN
Start: 2023-05-24 — End: 2023-07-06

## 2023-05-24 MED ORDER — CETIRIZINE HCL 10 MG PO TABS
10.0000 mg | ORAL_TABLET | Freq: Every day | ORAL | 11 refills | Status: DC
Start: 2023-05-24 — End: 2024-02-11

## 2023-05-24 MED ORDER — GABAPENTIN 100 MG PO CAPS: 300.0000 mg | ORAL_CAPSULE | Freq: Every evening | ORAL | 2 refills | Status: DC

## 2023-05-24 NOTE — Telephone Encounter (Signed)
Donna Evans from [place] , called n says didn't receive referral for pallative care that has ben sent, and he is asking for this to be faxed to [phone]

## 2023-05-24 NOTE — Progress Notes (Signed)
Established Patient Office Visit  Subjective    Patient ID: Donna Evans, female    DOB: February 21, 1946  Age: 77 y.o. MRN: [id number]  CC:  Chief Complaint  Patient presents with   Follow-up    4 week follow up    HPI Donna Evans presents for follow up. She is here with her nephew [name]. Overall patient has improved in terms of her mobility. No falls since LOV. She is still in pain and having difficulty sleeping.  A.Fib/CHF/Hypotension: -Medications: Amiodarone 100 mg, Eliquis 5 mg BID, Metoprolol 12.5 mg BID -Patient is compliant with above medications and reports no side effects. -Checking BP at home (average): Not checking currently -Denies any SOB, chest pain. -Following with Cardiology -Last echo 4/23 showing EF 55%  HLD: -Medications: Nothing currently - had been on Crestor in the past -Last lipid panel: Lipid Panel     Component Value Date/Time   CHOL 111 04/29/2022 0504   TRIG 114 04/29/2022 0504   HDL 42 04/29/2022 0504   CHOLHDL 2.6 04/29/2022 0504   VLDL 23 04/29/2022 0504   LDLCALC 46 04/29/2022 0504   Venous Insufficiency/PAD: -Had been following with Vascular, last seen 07/07/2022 with abnormal ABIs at the time. -Complains that feet are dusky in color and cold -Has vascular appointment next week  CKD3a/AoCD: -Creatinine 1.18, GFR 48 4/24 -Following with Nephrology, last seen 08/23/22  Allergic Rhinitis: -Taking Claritin daily but states it does not help  Anxiety/Insomnia:  -Had been on Xanax for years, started in 2005 but recently this was discontinued and replaced by BuSpar which was not helping so it was discontinued.  -She is taking trazodone 100-200 mg at night for insomnia but states it does not help -Patient also on gabapentin 300 mg at night or sciatic pain but states it does not help either. -Patient wanting referral to palliative care, states she prefers quality over quantity of life   Outpatient Encounter Medications as of 05/24/2023   Medication Sig   amiodarone (PACERONE) 100 MG tablet Take 1 tablet (100 mg total) by mouth daily.   apixaban (ELIQUIS) 5 MG TABS tablet Take 5 mg by mouth 2 (two) times daily.   ascorbic acid (VITAMIN C) 500 MG tablet Take 1 tablet (500 mg total) by mouth daily.   cetirizine (ZYRTEC) 10 MG tablet Take 1 tablet (10 mg total) by mouth daily.   cholecalciferol (VITAMIN D3) 25 MCG (1000 UNIT) tablet Take 1,000 Units by mouth daily.   cyclobenzaprine (FLEXERIL) 5 MG tablet Take 1 tablet (5 mg total) by mouth 3 (three) times daily as needed for muscle spasms.   feeding supplement (ENSURE ENLIVE / ENSURE PLUS) LIQD Take 237 mLs by mouth 2 (two) times daily between meals.   folic acid (FOLVITE) 1 MG tablet Take 1 tablet (1 mg total) by mouth daily.   lidocaine (LIDODERM) 5 % Place 1 patch onto the skin daily. Remove & Discard patch within 12 hours or as directed by MD. Apply to back.   metoprolol tartrate (LOPRESSOR) 25 MG tablet Take 0.5 tablets (12.5 mg total) by mouth 2 (two) times daily.   Multiple Vitamin (MULTIVITAMIN WITH MINERALS) TABS tablet Take 1 tablet by mouth daily.   traZODone (DESYREL) 50 MG tablet Take 50 mg by mouth at bedtime. 2nd dose in the morning as needed   [DISCONTINUED] gabapentin (NEURONTIN) 100 MG capsule TAKE 1 CAPSULE TWICE DAILY   [DISCONTINUED] loratadine (CLARITIN) 10 MG tablet Take 1 tablet (10 mg total) by mouth daily.  acetaminophen (TYLENOL) 500 MG tablet Take 1,000 mg by mouth every 6 (six) hours as needed. (Patient not taking: Reported on 04/25/2023)   gabapentin (NEURONTIN) 100 MG capsule Take 3 capsules (300 mg total) by mouth at bedtime.   HYDROcodone-acetaminophen (NORCO/VICODIN) 5-325 MG tablet Take 1 tablet by mouth every 8 (eight) hours as needed for severe pain.   No facility-administered encounter medications on file as of 05/24/2023.    Past Medical History:  Diagnosis Date   Allergic state    Anginal pain (HCC)    Prinzmetal's angina   Anxiety     Arthritis    osteoarthritis   Breast cancer (HCC) 1990   right breast cancer   Chronic pain    Chronic pain    COPD (chronic obstructive pulmonary disease) (HCC)    Depression    Edema    Fibromyalgia    Foot fracture    Bilateral   GERD (gastroesophageal reflux disease)    History of kidney stones    Hypertension    Leg fracture, right    Low back pain    Lumbosacral neuritis    Medulloadrenal hyperfunction (HCC)    Migraine headache    Peripheral neuropathy    Shoulder fracture, right    Stroke [facility])    TIA   Systemic lupus erythematosus (HCC)    Thyroid disease    TIA (transient ischemic attack) 11/28/2013   Transient global amnesia 2011    Past Surgical History:  Procedure Laterality Date   ABDOMINAL HYSTERECTOMY  1981   APPENDECTOMY  1957   AUGMENTATION MAMMAPLASTY Bilateral 1990   saline submuscular   BACK SURGERY  1989   BILATERAL TOTAL MASTECTOMY WITH AXILLARY LYMPH NODE DISSECTION  1990   BREAST IMPLANT EXCHANGE Bilateral 05/11/2016   Procedure: REMOVAL AND REPLACEMENT OF BREAST IMPLANTS ;  Surgeon: Glenna Fellows, MD;  Location: [facility];  Service: Plastics;  Laterality: Bilateral;   CAPSULECTOMY Bilateral 05/11/2016   Procedure: CAPSULECTOMY CAPSULORRAPHY ;  Surgeon: Glenna Fellows, MD;  Location: [facility];  Service: Plastics;  Laterality: Bilateral;   CATARACT EXTRACTION W/PHACO Right 12/26/2018   Procedure: CATARACT EXTRACTION PHACO AND INTRAOCULAR LENS PLACEMENT (IOC) RIGHT;  Surgeon: Galen Manila, MD;  Location: [facility];  Service: Ophthalmology;  Laterality: Right;  [place]  00:46 CDE 8.50 Fluid pack lot # [id number]   CATARACT EXTRACTION W/PHACO Left 01/23/2019   Procedure: CATARACT EXTRACTION PHACO AND INTRAOCULAR LENS PLACEMENT (IOC) LEFT;  Surgeon: Galen Manila, MD;  Location: [facility];  Service: Ophthalmology;  Laterality: Left;  [place]  00:45 CDE  8.41 fluid pack lot # [id number]   CHOLECYSTECTOMY  1979    COLONOSCOPY WITH PROPOFOL N/A 01/25/2018   Procedure: COLONOSCOPY WITH PROPOFOL;  Surgeon: Scot Jun, MD;  Location[facility];  Service: Endoscopy;  Laterality: N/A;   COPD     ESOPHAGOGASTRODUODENOSCOPY (EGD) WITH PROPOFOL N/A 03/07/2018   Procedure: ESOPHAGOGASTRODUODENOSCOPY (EGD) WITH PROPOFOL;  Surgeon: Scot Jun, MD;  Location: [facility];  Service: Endoscopy;  Laterality: N/A;   FRACTURE SURGERY     HARDWARE REMOVAL Left 06/18/2020   Procedure: HARDWARE REMOVAL;  Surgeon: Lyndle Herrlich, MD;  Location: [facility];  Service: Orthopedics;  Laterality: Left;   HUMERUS IM NAIL Left 12/31/2019   Procedure: INTRAMEDULLARY (IM) NAIL HUMERAL;  Surgeon: Lyndle Herrlich, MD;  Location: [facility];  Service: Orthopedics;  Laterality: Left;   LIPOSUCTION Right 05/11/2016   Procedure: LIPOSUCTION;  Surgeon: Glenna Fellows, MD;  Location: [facility];  Service: Plastics;  Laterality: Right;   LIVER BIOPSY  2011   MASTECTOMY SUBCUTANEOUS Bilateral 1990   MASTOPEXY Bilateral 05/11/2016   Procedure: MASTOPEXY BILATERAL ;  Surgeon: Glenna Fellows, MD;  Location: [facility];  Service: Plastics;  Laterality: Bilateral;    Family History  Problem Relation Age of Onset   Atrial fibrillation Mother    Atrial fibrillation Sister    Cancer Sister    Diabetes Sister    Breast cancer Sister 19   Diabetes Father    Cancer Sister    Diabetes Sister    Atrial fibrillation Sister    Kidney disease Maternal Aunt     Social History   Socioeconomic History   Marital status: Widowed    Spouse name: Not on file   Number of children: 1   Years of education: college3   Highest education level: Associate degree: occupational, Scientist, product/process development, or vocational program  Occupational History   Occupation: Retired  Tobacco Use   Smoking status: Former    Packs/day: 1.00    Years: 40.00    Additional pack years: 0.00    Total pack years: 40.00    Types: Cigarettes     Quit date: 08/28/2005    Years since quitting: 17.7   Smokeless tobacco: Never  Vaping Use   Vaping Use: Never used  Substance and Sexual Activity   Alcohol use: Not Currently   Drug use: No   Sexual activity: Never  Other Topics Concern   Not on file  Social History Narrative   Not on file   Social Determinants of Health   Financial Resource Strain: Low Risk  (05/22/2023)   Overall Financial Resource Strain (CARDIA)    Difficulty of Paying Living Expenses: Not very hard  Food Insecurity: No Food Insecurity (05/22/2023)   Hunger Vital Sign    Worried About Running Out of Food in the Last Year: Never true    Ran Out of Food in the Last Year: Never true  Transportation Needs: Unmet Transportation Needs (05/22/2023)   PRAPARE - Transportation    Lack of Transportation (Medical): Yes    Lack of Transportation (Non-Medical): Yes  Physical Activity: Unknown (05/22/2023)   Exercise Vital Sign    Days of Exercise per Week: 0 days    Minutes of Exercise per Session: Not on file  Stress: No Stress Concern Present (05/22/2023)   [employer] of Occupational Health - Occupational Stress Questionnaire    Feeling of Stress : Only a little  Social Connections: Socially Isolated (05/22/2023)   Social Connection and Isolation Panel [NHANES]    Frequency of Communication with Friends and Family: More than three times a week    Frequency of Social Gatherings with Friends and Family: Once a week    Attends Religious Services: Never    Database administrator or Organizations: No    Attends Engineer, structural: Not on file    Marital Status: Widowed  Intimate Partner Violence: Not At Risk (03/24/2023)   Humiliation, Afraid, Rape, and Kick questionnaire    Fear of Current or Ex-Partner: No    Emotionally Abused: No    Physically Abused: No    Sexually Abused: No    Review of Systems  Constitutional:  Positive for malaise/fatigue. Negative for chills and fever.  Respiratory:   Negative for shortness of breath.   Cardiovascular:  Negative for chest pain.  Musculoskeletal:  Positive for back pain and joint pain.  Psychiatric/Behavioral:  The patient has insomnia.         Objective    BP 120/70   Pulse (!) 49   Temp 98 F (36.7 C)   Resp 16   Ht 5\' 7"  (1.702 m)   Wt 108 lb (49 kg)   SpO2 98%   BMI 16.92 kg/m   Physical Exam Constitutional:      Appearance: Normal appearance.  HENT:     Head: Normocephalic and atraumatic.  Eyes:     Conjunctiva/sclera: Conjunctivae normal.  Cardiovascular:     Rate and Rhythm: Normal rate. Rhythm irregular.     Pulses:          Dorsalis pedis pulses are 1+ on the right side and 1+ on the left side.  Pulmonary:     Effort: Pulmonary effort is normal.     Breath sounds: Normal breath sounds.  Skin:    General: Skin is dry.     Comments: Cool, dry and darkly discolored lower extremities   Neurological:     General: No focal deficit present.     Mental Status: She is alert. Mental status is at baseline.  Psychiatric:        Mood and Affect: Mood normal.        Behavior: Behavior normal.         Assessment & Plan:   1. Falls frequently/Failure to thrive in adult: Patient still needing referral, will place a new referral. Overall it seems like things are improving.   - Amb Referral to Palliative Care  2. Bilateral low back pain with bilateral sciatica, unspecified chronicity: Discussed how I will not write controlled substances for her pain, however I will change her Gabapentin to 300 mg at night and prescribe Flexeril to take as needed.  - Amb Referral to Palliative Care - cyclobenzaprine (FLEXERIL) 5 MG tablet; Take 1 tablet (5 mg total) by mouth 3 (three) times daily as needed for muscle spasms.  Dispense: 30 tablet; Refill: 1 - gabapentin (NEURONTIN) 100 MG capsule; Take 3 capsules (300 mg total) by mouth at bedtime.  Dispense: 180 capsule; Refill: 2  3. Environmental allergies: Stop Claritin, try  Zyrtec instead.   - cetirizine (ZYRTEC) 10 MG tablet; Take 1 tablet (10 mg total) by mouth daily.  Dispense: 30 tablet; Refill: 11   Return in about 3 months (around 08/24/2023).    Margarita Mail, DO

## 2023-05-31 ENCOUNTER — Other Ambulatory Visit: Payer: Self-pay | Admitting: Internal Medicine

## 2023-05-31 ENCOUNTER — Ambulatory Visit (INDEPENDENT_AMBULATORY_CARE_PROVIDER_SITE_OTHER): Payer: PPO | Admitting: Nurse Practitioner

## 2023-05-31 VITALS — Resp 17 | Ht 66.0 in | Wt 106.6 lb

## 2023-05-31 DIAGNOSIS — N183 Chronic kidney disease, stage 3 unspecified: Secondary | ICD-10-CM

## 2023-05-31 DIAGNOSIS — I10 Essential (primary) hypertension: Secondary | ICD-10-CM

## 2023-05-31 DIAGNOSIS — G4700 Insomnia, unspecified: Secondary | ICD-10-CM

## 2023-05-31 DIAGNOSIS — R6889 Other general symptoms and signs: Secondary | ICD-10-CM | POA: Diagnosis not present

## 2023-05-31 DIAGNOSIS — I5032 Chronic diastolic (congestive) heart failure: Secondary | ICD-10-CM

## 2023-05-31 DIAGNOSIS — I890 Lymphedema, not elsewhere classified: Secondary | ICD-10-CM

## 2023-05-31 NOTE — Telephone Encounter (Signed)
Med dc'd 05/10/23 by Lynann Bologna RPH "dose change" 05/10/23  Requested Prescriptions  Refused Prescriptions Disp Refills   traZODone (DESYREL) 100 MG tablet [Pharmacy Med Name: TRAZODONE HCL 100 MG TAB] 90 tablet 0    Sig: TAKE ONE TABLET BY MOUTH AT BEDTIME     Psychiatry: Antidepressants - Serotonin Modulator Passed - 05/31/2023 12:04 PM      Passed - Valid encounter within last 6 months    Recent Outpatient Visits           1 week ago Falls frequently   [facility] Margarita Mail, DO   1 month ago Hospital discharge follow-up   [facility] Margarita Mail, DO   5 months ago Need for influenza vaccination   [facility] Margarita Mail, DO   6 years ago Chronic pain syndrome   [facility] Gabriel Cirri, NP   6 years ago Generalized anxiety disorder   [facility] Alba Cory, MD       Future Appointments             In 1 week Sherron Monday, MD [facility]   In 2 months Margarita Mail, DO [facility]

## 2023-05-31 NOTE — Progress Notes (Signed)
Subjective:    Patient ID: Donna Evans, female    DOB: 22-Oct-1946, 77 y.o.   MRN: [id number] Chief Complaint  Patient presents with   Follow-up    The patient returns to the office for followup evaluation regarding leg swelling.  The swelling has persisted and the pain associated with swelling continues. There have not been any interval development of a ulcerations or wounds.  Since the previous visit the patient has been wearing graduated compression stockings and has noted little if any improvement in the lymphedema. The patient has been using compression routinely morning until night.  The patient also states elevation during the day and exercise is being done too.      Review of Systems  Cardiovascular:  Positive for leg swelling.  All other systems reviewed and are negative.      Objective:   Physical Exam Vitals reviewed.  HENT:     Head: Normocephalic.  Cardiovascular:     Rate and Rhythm: Normal rate.  Pulmonary:     Effort: Pulmonary effort is normal.  Musculoskeletal:     Right lower leg: 2+ Edema present.     Left lower leg: 2+ Edema present.  Skin:    General: Skin is warm and dry.     Capillary Refill: Capillary refill takes less than 2 seconds.  Neurological:     Mental Status: She is alert and oriented to person, place, and time.  Psychiatric:        Mood and Affect: Mood normal.        Behavior: Behavior normal.        Thought Content: Thought content normal.        Judgment: Judgment normal.     Resp 17   Ht 5\' 6"  (1.676 m)   Wt 106 lb 9.6 oz (48.4 kg)   BMI 17.21 kg/m   Past Medical History:  Diagnosis Date   Allergic state    Anginal pain (HCC)    Prinzmetal's angina   Anxiety    Arthritis    osteoarthritis   Breast cancer (HCC) 1990   right breast cancer   Chronic pain    Chronic pain    COPD (chronic obstructive pulmonary disease) (HCC)    Depression    Edema    Fibromyalgia    Foot fracture    Bilateral   GERD  (gastroesophageal reflux disease)    History of kidney stones    Hypertension    Leg fracture, right    Low back pain    Lumbosacral neuritis    Medulloadrenal hyperfunction (HCC)    Migraine headache    Peripheral neuropathy    Shoulder fracture, right    Stroke [facility])    TIA   Systemic lupus erythematosus (HCC)    Thyroid disease    TIA (transient ischemic attack) 11/28/2013   Transient global amnesia 2011    Social History   Socioeconomic History   Marital status: Widowed    Spouse name: Not on file   Number of children: 1   Years of education: college3   Highest education level: Associate degree: occupational, Scientist, product/process development, or vocational program  Occupational History   Occupation: Retired  Tobacco Use   Smoking status: Former    Packs/day: 1.00    Years: 40.00    Additional pack years: 0.00    Total pack years: 40.00    Types: Cigarettes    Quit date: 08/28/2005    Years since quitting: 17.7  Smokeless tobacco: Never  Vaping Use   Vaping Use: Never used  Substance and Sexual Activity   Alcohol use: Not Currently   Drug use: No   Sexual activity: Never  Other Topics Concern   Not on file  Social History Narrative   Not on file   Social Determinants of Health   Financial Resource Strain: Low Risk  (05/22/2023)   Overall Financial Resource Strain (CARDIA)    Difficulty of Paying Living Expenses: Not very hard  Food Insecurity: No Food Insecurity (05/22/2023)   Hunger Vital Sign    Worried About Running Out of Food in the Last Year: Never true    Ran Out of Food in the Last Year: Never true  Transportation Needs: Unmet Transportation Needs (05/22/2023)   PRAPARE - Transportation    Lack of Transportation (Medical): Yes    Lack of Transportation (Non-Medical): Yes  Physical Activity: Unknown (05/22/2023)   Exercise Vital Sign    Days of Exercise per Week: 0 days    Minutes of Exercise per Session: Not on file  Stress: No Stress Concern Present (05/22/2023)    [employer] of Occupational Health - Occupational Stress Questionnaire    Feeling of Stress : Only a little  Social Connections: Socially Isolated (05/22/2023)   Social Connection and Isolation Panel [NHANES]    Frequency of Communication with Friends and Family: More than three times a week    Frequency of Social Gatherings with Friends and Family: Once a week    Attends Religious Services: Never    Database administrator or Organizations: No    Attends Engineer, structural: Not on file    Marital Status: Widowed  Intimate Partner Violence: Not At Risk (03/24/2023)   Humiliation, Afraid, Rape, and Kick questionnaire    Fear of Current or Ex-Partner: No    Emotionally Abused: No    Physically Abused: No    Sexually Abused: No    Past Surgical History:  Procedure Laterality Date   ABDOMINAL HYSTERECTOMY  1981   APPENDECTOMY  1957   AUGMENTATION MAMMAPLASTY Bilateral 1990   saline submuscular   BACK SURGERY  1989   BILATERAL TOTAL MASTECTOMY WITH AXILLARY LYMPH NODE DISSECTION  1990   BREAST IMPLANT EXCHANGE Bilateral 05/11/2016   Procedure: REMOVAL AND REPLACEMENT OF BREAST IMPLANTS ;  Surgeon: Glenna Fellows, MD;  Location: [facility];  Service: Plastics;  Laterality: Bilateral;   CAPSULECTOMY Bilateral 05/11/2016   Procedure: CAPSULECTOMY CAPSULORRAPHY ;  Surgeon: Glenna Fellows, MD;  Location: [facility];  Service: Plastics;  Laterality: Bilateral;   CATARACT EXTRACTION W/PHACO Right 12/26/2018   Procedure: CATARACT EXTRACTION PHACO AND INTRAOCULAR LENS PLACEMENT (IOC) RIGHT;  Surgeon: Galen Manila, MD;  Location: [facility];  Service: Ophthalmology;  Laterality: Right;  [place]  00:46 CDE 8.50 Fluid pack lot # [id number]   CATARACT EXTRACTION W/PHACO Left 01/23/2019   Procedure: CATARACT EXTRACTION PHACO AND INTRAOCULAR LENS PLACEMENT (IOC) LEFT;  Surgeon: Galen Manila, MD;  Location: [facility];  Service: Ophthalmology;  Laterality:  Left;  [place]  00:45 CDE  8.41 fluid pack lot # [id number]   CHOLECYSTECTOMY  1979   COLONOSCOPY WITH PROPOFOL N/A 01/25/2018   Procedure: COLONOSCOPY WITH PROPOFOL;  Surgeon: Scot Jun, MD;  Location: [facility];  Service: Endoscopy;  Laterality: N/A;   COPD     ESOPHAGOGASTRODUODENOSCOPY (EGD) WITH PROPOFOL N/A 03/07/2018   Procedure: ESOPHAGOGASTRODUODENOSCOPY (EGD) WITH PROPOFOL;  Surgeon: Scot Jun, MD;  Location: [facility];  Service: Endoscopy;  Laterality: N/A;   FRACTURE SURGERY     HARDWARE REMOVAL Left 06/18/2020   Procedure: HARDWARE REMOVAL;  Surgeon: Lyndle Herrlich, MD;  Location: [facility];  Service: Orthopedics;  Laterality: Left;   HUMERUS IM NAIL Left 12/31/2019   Procedure: INTRAMEDULLARY (IM) NAIL HUMERAL;  Surgeon: Lyndle Herrlich, MD;  Location: [facility];  Service: Orthopedics;  Laterality: Left;   LIPOSUCTION Right 05/11/2016   Procedure: LIPOSUCTION;  Surgeon: Glenna Fellows, MD;  Location: [facility];  Service: Plastics;  Laterality: Right;   LIVER BIOPSY  2011   MASTECTOMY SUBCUTANEOUS Bilateral 1990   MASTOPEXY Bilateral 05/11/2016   Procedure: MASTOPEXY BILATERAL ;  Surgeon: Glenna Fellows, MD;  Location: [facility];  Service: Plastics;  Laterality: Bilateral;    Family History  Problem Relation Age of Onset   Atrial fibrillation Mother    Atrial fibrillation Sister    Cancer Sister    Diabetes Sister    Breast cancer Sister 40   Diabetes Father    Cancer Sister    Diabetes Sister    Atrial fibrillation Sister    Kidney disease Maternal Aunt     Allergies  Allergen Reactions   Ciprofloxacin Nausea And Vomiting and Hives   Codeine Anaphylaxis, Hives and Other (See Comments)    Reaction:  Unknown   Fluconazole Hives   Latex Anaphylaxis and Rash   Meperidine Anaphylaxis and Hives    Reaction:  Unknown  Reaction:  Unknown   Unknown   Morphine And Codeine Hives, Anaphylaxis and Other (See Comments)     Reaction:  Unknown   unknown  Other reaction(s): Other (See Comments)  unknown   Doxycycline Nausea And Vomiting   Flagyl [Metronidazole] Nausea And Vomiting    Reaction:  Unknown    Influenza Vaccines Swelling    Localized swelling   Tape Other (See Comments)    Pt allergic to Adhesive tape and latex.(  Skin breaks out)   Valtrex [Valacyclovir Hcl] Hives   Buprenorphine     Other reaction(s): Other (see comments) unknown   Duloxetine Other (See Comments)    Other Reaction(s): GI intolerance  Reaction:  Unknown     Reaction:  Unknown     Reaction:  Unknown   Silicone Other (See Comments)    Pt allergic to Adhesive tape and latex.(  Skin breaks out)   Tramadol     Other reaction(s): Not available   Amoxicillin-Pot Clavulanate Nausea And Vomiting   Sulfamethoxazole-Trimethoprim Nausea And Vomiting and Rash   Valacyclovir Rash       Latest Ref Rng & Units 03/29/2023    4:25 AM 03/25/2023    2:55 AM 03/24/2023    6:20 AM  CBC  WBC 4.0 - 10.5 K/uL  6.6  7.4   Hemoglobin 12.0 - 15.0 g/dL 16.1  09.6  04.5   Hematocrit 36.0 - 46.0 %  33.6  33.4   Platelets 150 - 400 K/uL  211  207       CMP     Component Value Date/Time   NA 136 03/29/2023 0425   NA 141 04/29/2015 1416   NA 145 02/29/2012 1316   K 4.2 03/29/2023 0425   K 4.1 02/29/2012 0057   CL 100 03/29/2023 0425   CL 110 (H) 02/29/2012 0057   CO2 30 03/29/2023 0425   CO2 25 02/29/2012 0057   GLUCOSE 96 03/29/2023 0425   GLUCOSE 105 (H) 02/29/2012 0057   BUN 28 (  H) 03/29/2023 0425   BUN 24 04/29/2015 1416   BUN 22 (H) 02/29/2012 0057   CREATININE 1.18 (H) 03/29/2023 0425   CREATININE 0.97 02/29/2012 0057   CALCIUM 8.0 (L) 03/29/2023 0425   CALCIUM 8.1 (L) 02/29/2012 0057   PROT 6.5 03/26/2023 0638   PROT 7.8 02/28/2012 0905   ALBUMIN 2.7 (L) 03/26/2023 0638   ALBUMIN 2.7 (L) 10/08/2022 1530   ALBUMIN 4.1 02/28/2012 0905   AST 79 (H) 03/26/2023 0638   AST 28 02/28/2012 0905   ALT 44 03/26/2023 0638    ALT 28 02/28/2012 0905   ALKPHOS 537 (H) 03/26/2023 0638   ALKPHOS 129 02/28/2012 0905   BILITOT 1.0 03/26/2023 0638   BILITOT 0.5 02/28/2012 0905   GFRNONAA 48 (L) 03/29/2023 0425   GFRNONAA >60 02/29/2012 0057     No results found.     Assessment & Plan:   1. Lymphedema Recommend:  No surgery or intervention at this point in time.   The Patient is CEAP [id number].  The patient has been wearing compression for more than 12 weeks with no or little benefit.  The patient has been exercising daily for more than 12 weeks. The patient has been elevating and taking OTC pain medications for more than 12 weeks.  None of these have have eliminated the pain related to the lymphedema or the discomfort regarding excessive swelling and venous congestion.    I have reviewed my discussion with the patient regarding lymphedema and why it  causes symptoms.  Patient will continue wearing graduated compression on a daily basis. The patient should put the compression on first thing in the morning and removing them in the evening. The patient should not sleep in the compression.   In addition, behavioral modification throughout the day will be continued.  This will include frequent elevation (such as in a recliner), use of over the counter pain medications as needed and exercise such as walking.  The systemic causes for chronic edema such as liver, kidney and cardiac etiologies do not appear to have significant changed over the past year.    The patient has chronic , severe lymphedema with hyperpigmentation of the skin and has done MLD, skin care, medication, diet, exercise, elevation and compression for 4 weeks with no improvement,  I am recommending a lymphedema pump.  The patient still has stage 3 lymphedema and therefore, I believe that a lymph pump is needed to improve the control of the patient's lymphedema and improve the quality of life.  Additionally, a lymph pump is warranted because it will reduce  the risk of cellulitis and ulceration in the future.  Patient should follow-up in six months   2. Abnormal ankle brachial index (ABI) ABIs were previously done a year ago and were actually normal.  Her abnormal ABIs were incorrect, as indicated by a home health assessment.  3. Hypertension, benign Continue antihypertensive medications as already ordered, these medications have been reviewed and there are no changes at this time.  4. Stage 3 chronic kidney disease, unspecified whether stage 3a or 3b CKD (HCC) This is likely contributing to her edema  5. Chronic diastolic CHF (congestive heart failure) (HCC) This she also contributes to the patient's lower extremity edema although currently she is not fluid overloaded.   Current Outpatient Medications on File Prior to Visit  Medication Sig Dispense Refill   amiodarone (PACERONE) 100 MG tablet Take 1 tablet (100 mg total) by mouth daily. 30 tablet 0   apixaban (ELIQUIS)  5 MG TABS tablet Take 5 mg by mouth 2 (two) times daily.     ascorbic acid (VITAMIN C) 500 MG tablet Take 1 tablet (500 mg total) by mouth daily. 30 tablet 0   cetirizine (ZYRTEC) 10 MG tablet Take 1 tablet (10 mg total) by mouth daily. 30 tablet 11   cholecalciferol (VITAMIN D3) 25 MCG (1000 UNIT) tablet Take 1,000 Units by mouth daily.     cyclobenzaprine (FLEXERIL) 5 MG tablet Take 1 tablet (5 mg total) by mouth 3 (three) times daily as needed for muscle spasms. 30 tablet 1   feeding supplement (ENSURE ENLIVE / ENSURE PLUS) LIQD Take 237 mLs by mouth 2 (two) times daily between meals. 237 mL 12   folic acid (FOLVITE) 1 MG tablet Take 1 tablet (1 mg total) by mouth daily. 30 tablet 0   gabapentin (NEURONTIN) 100 MG capsule Take 3 capsules (300 mg total) by mouth at bedtime. 180 capsule 2   lidocaine (LIDODERM) 5 % Place 1 patch onto the skin daily. Remove & Discard patch within 12 hours or as directed by MD. Apply to back.  0   metoprolol tartrate (LOPRESSOR) 25 MG tablet  Take 0.5 tablets (12.5 mg total) by mouth 2 (two) times daily. 30 tablet 0   Multiple Vitamin (MULTIVITAMIN WITH MINERALS) TABS tablet Take 1 tablet by mouth daily. 30 tablet 0   traZODone (DESYREL) 50 MG tablet Take 50 mg by mouth at bedtime. 2nd dose in the morning as needed     No current facility-administered medications on file prior to visit.    There are no Patient Instructions on file for this visit. No follow-ups on file.   Georgiana Spinner, NP

## 2023-06-01 ENCOUNTER — Encounter (INDEPENDENT_AMBULATORY_CARE_PROVIDER_SITE_OTHER): Payer: Self-pay | Admitting: Nurse Practitioner

## 2023-06-03 ENCOUNTER — Other Ambulatory Visit: Payer: Self-pay | Admitting: Internal Medicine

## 2023-06-03 MED ORDER — TRAZODONE HCL 50 MG PO TABS: 50.0000 mg | ORAL_TABLET | Freq: Every day | ORAL | 0 refills | Status: DC

## 2023-06-03 NOTE — Telephone Encounter (Signed)
Medication Refill - Medication: traZODone (DESYREL) 50 MG tablet  Has the patient contacted their pharmacy? Yes.   Pt told to contact provider  Preferred Pharmacy (with phone number or street name):  [facility] - [place], [place] - [address] Phone: [phone]  Fax: [phone]     Has the patient been seen for an appointment in the last year OR does the patient have an upcoming appointment? Yes.    Agent: Please be advised that RX refills may take up to 3 business days. We ask that you follow-up with your pharmacy.

## 2023-06-03 NOTE — Telephone Encounter (Signed)
Requested medications are due for refill today.  unsure  Requested medications are on the active medications list.  yes  Last refill. 04/12/2023   Future visit scheduled.   yes  Notes to clinic.   Medication is historical.   Requested Prescriptions  Pending Prescriptions Disp Refills   traZODone (DESYREL) 50 MG tablet      Sig: Take 1 tablet (50 mg total) by mouth at bedtime. 2nd dose in the morning as needed     Psychiatry: Antidepressants - Serotonin Modulator Passed - 06/03/2023 11:51 AM      Passed - Valid encounter within last 6 months    Recent Outpatient Visits           1 week ago Falls frequently   [facility] Margarita Mail, DO   1 month ago Hospital discharge follow-up   [facility] Margarita Mail, DO   5 months ago Need for influenza vaccination   [facility] Margarita Mail, DO   6 years ago Chronic pain syndrome   [facility] Gabriel Cirri, NP   6 years ago Generalized anxiety disorder   [facility] Alba Cory, MD       Future Appointments             In 2 months Margarita Mail, DO [facility], [facility]

## 2023-06-08 ENCOUNTER — Ambulatory Visit: Payer: PPO | Admitting: Internal Medicine

## 2023-06-20 ENCOUNTER — Telehealth: Payer: Self-pay

## 2023-06-20 ENCOUNTER — Other Ambulatory Visit: Payer: Self-pay | Admitting: Internal Medicine

## 2023-06-20 DIAGNOSIS — G4709 Other insomnia: Secondary | ICD-10-CM

## 2023-06-24 ENCOUNTER — Other Ambulatory Visit: Payer: Self-pay | Admitting: Internal Medicine

## 2023-06-24 DIAGNOSIS — R296 Repeated falls: Secondary | ICD-10-CM

## 2023-06-24 NOTE — Telephone Encounter (Signed)
Requested Prescriptions  Pending Prescriptions Disp Refills   folic acid (FOLVITE) 1 MG tablet [Pharmacy Med Name: FOLIC ACID 1 MG TAB] 90 tablet 0    Sig: TAKE ONE TABLET BY MOUTH EVERY DAY     Endocrinology:  Vitamins Passed - 06/24/2023 10:42 AM      Passed - Valid encounter within last 12 months    Recent Outpatient Visits           1 month ago Falls frequently   [facility] Margarita Mail, DO   2 months ago Hospital discharge follow-up   [facility] Margarita Mail, DO   5 months ago Need for influenza vaccination   [facility] Margarita Mail, DO   6 years ago Chronic pain syndrome   [place] [facility] Gabriel Cirri, NP   6 years ago Generalized anxiety disorder   [facility] Alba Cory, MD       Future Appointments             In 2 months Margarita Mail, DO [facility], [facility]

## 2023-06-25 ENCOUNTER — Other Ambulatory Visit: Payer: Self-pay | Admitting: Internal Medicine

## 2023-06-27 ENCOUNTER — Telehealth: Payer: Self-pay

## 2023-06-27 ENCOUNTER — Telehealth: Payer: PPO | Admitting: Family Medicine

## 2023-06-27 ENCOUNTER — Telehealth: Payer: Self-pay | Admitting: Internal Medicine

## 2023-06-27 DIAGNOSIS — R109 Unspecified abdominal pain: Secondary | ICD-10-CM

## 2023-06-27 DIAGNOSIS — R300 Dysuria: Secondary | ICD-10-CM

## 2023-06-27 DIAGNOSIS — R350 Frequency of micturition: Secondary | ICD-10-CM | POA: Diagnosis not present

## 2023-06-27 LAB — POCT URINALYSIS DIPSTICK
Bilirubin, UA: NEGATIVE
Glucose, UA: NEGATIVE
Ketones, UA: NEGATIVE
Nitrite, UA: NEGATIVE
Protein, UA: POSITIVE — AB
Spec Grav, UA: 1.01 (ref 1.010–1.025)
Urobilinogen, UA: 0.2 E.U./dL
pH, UA: 5 (ref 5.0–8.0)

## 2023-06-27 MED ORDER — CEPHALEXIN 500 MG PO CAPS
500.0000 mg | ORAL_CAPSULE | Freq: Two times a day (BID) | ORAL | 0 refills | Status: DC
Start: 2023-06-27 — End: 2023-07-01

## 2023-06-27 NOTE — Telephone Encounter (Signed)
Pt is calling in because she believes she has a kidney infection or a UTI and wants to know can she drop off a urine sample or have someone drop it off to be tested. Pt says she cannot make it in for an appointment at this time. Please advise.

## 2023-06-27 NOTE — Progress Notes (Signed)
Name: Donna Evans   MRN: [id number]    DOB: February 17, 1946   Date:06/27/2023       Progress Note  Subjective:    Chief Complaint  Chief Complaint  Patient presents with   Flank Pain    Right side, over the weekend   Dysuria   Urinary Frequency    I connected with  [name]  on 06/27/23 at  3:40 PM EDT by a - TELEPHONE ONLY - pt and family could not get video to work -  and verified that I am speaking with the correct person using two identifiers.  I discussed the limitations of evaluation and management by telemedicine and the availability of in person appointments. The patient expressed understanding and agreed to proceed. Staff also discussed with the patient that there may be a patient responsible charge related to this service. Patient Location: home  Provider Location: [facility] office Additional Individuals present:  wendy apple - pt's niece and MPOA  HPI Pt notes onset of pain in her "kidney area" on the right side and that began over the weekend, and urinary frequency and some dysuria.  She reports it feels similar to past episodes She is able to push fluids, denies vomiting abd pain fever   Patient Active Problem List   Diagnosis Date Noted   Tongue ulceration 03/29/2023   Cellulitis of right foot 03/26/2023   Atrial fibrillation with rapid ventricular response (HCC) 03/26/2023   Paroxysmal atrial fibrillation (HCC) 03/25/2023   Hypophosphatemia 03/25/2023   Multiple rib fractures 03/25/2023   Compression fracture of L2 (HCC) 03/25/2023   Subdural hematoma (HCC) 03/24/2023   Subarachnoid hemorrhage (HCC) 03/24/2023   Alcohol use 03/24/2023   Protein-calorie malnutrition, severe (HCC) 03/24/2023   Hypomagnesemia 03/24/2023   Acute urinary retention 03/24/2023   Goals of care, counseling/discussion 03/01/2023   Frequent falls 02/01/2023   Atrial fibrillation, chronic (HCC) 02/01/2023   Hypokalemia 02/01/2023   Bleeding 02/01/2023   Fracture, finger  11/18/2022   Orthostatic hypotension 11/17/2022   Encephalopathy 11/10/2022   CKD (chronic kidney disease), stage III (HCC) 11/02/2022   Metabolic acidosis 11/02/2022   Steatohepatitis, non-alcoholic 11/02/2022   Delirium due to another medical condition 10/08/2022   Bilateral leg edema    Head injury    Ground-level fall 10/06/2022   Acute encephalopathy 10/06/2022   Lymphedema 10/06/2022   Elevated LFTs 10/06/2022   HLD (hyperlipidemia) 08/10/2022   Alkaline phosphatase elevation 08/10/2022   Elevated troponin 04/28/2022   Stroke (HCC) 04/28/2022   Fall 04/28/2022   Acute renal failure superimposed on stage 3a chronic kidney disease (HCC) 04/28/2022   Chronic diastolic CHF (congestive heart failure) (HCC) 04/28/2022   Venous insufficiency 03/31/2022   SOB (shortness of breath) on exertion 03/08/2022   Pedal edema 03/08/2022   Closed fracture of distal end of left radius 12/22/2021   Low back pain 12/17/2021   Fracture of pelvis (HCC) 11/13/2021   Bilateral leg weakness 05/13/2021   Memory change 03/04/2021   Imbalance 03/04/2021   Falls frequently 03/04/2021   Dizziness 03/04/2021   Senile osteoporosis 10/18/2019   Long term current use of opiate analgesic 07/04/2017   Long term prescription opiate use 07/04/2017   Opiate use 07/04/2017   Bursitis of shoulder 07/04/2017   Closed fracture of upper end of humerus 07/04/2017   Knee pain 07/04/2017   Localized, primary osteoarthritis 07/04/2017   Osteoarthritis of knee 07/04/2017   Pain in limb 07/04/2017   Sprain of wrist 07/04/2017  Pain in joint involving ankle and foot 07/04/2017   Impingement syndrome of left shoulder region 06/29/2017   Trochanteric bursitis 06/29/2017   AKI (acute kidney injury) (HCC) 10/01/2016   Syncope 09/30/2016   Migraine headache 08/10/2016   Controlled substance agreement signed 06/12/2016   Insomnia 06/12/2016   Hypertension, benign 02/04/2016   Easy bruisability 11/13/2015    Osteoporosis 11/13/2015   Fibromyalgia syndrome 11/13/2015   Generalized anxiety disorder 11/13/2015   Chronic pain syndrome 11/13/2015   Overactive detrusor 09/11/2015   Adrenal adenoma 09/02/2015   Chronic cystitis 09/02/2015   Incomplete bladder emptying 09/02/2015   Renal colic 09/02/2015   Gross hematuria 07/28/2015   History of reconstruction of both breasts 06/18/2015   S/P cardiac catheterization 06/18/2015   Acquired absence of both breasts and nipples 11/20/2014   Transient cerebral ischemia 11/28/2013   Hemiplegia of dominant side (HCC) 11/20/2013    Social History   Tobacco Use   Smoking status: Former    Current packs/day: 0.00    Average packs/day: 1 pack/day for 40.0 years (40.0 ttl pk-yrs)    Types: Cigarettes    Start date: 08/28/1965    Quit date: 08/28/2005    Years since quitting: 17.8   Smokeless tobacco: Never  Substance Use Topics   Alcohol use: Not Currently     Current Outpatient Medications:    amiodarone (PACERONE) 100 MG tablet, Take 1 tablet (100 mg total) by mouth daily., Disp: 30 tablet, Rfl: 0   apixaban (ELIQUIS) 5 MG TABS tablet, Take 5 mg by mouth 2 (two) times daily., Disp: , Rfl:    ascorbic acid (VITAMIN C) 500 MG tablet, Take 1 tablet (500 mg total) by mouth daily., Disp: 30 tablet, Rfl: 0   cetirizine (ZYRTEC) 10 MG tablet, Take 1 tablet (10 mg total) by mouth daily., Disp: 30 tablet, Rfl: 11   cholecalciferol (VITAMIN D3) 25 MCG (1000 UNIT) tablet, Take 1,000 Units by mouth daily., Disp: , Rfl:    cyclobenzaprine (FLEXERIL) 5 MG tablet, Take 1 tablet (5 mg total) by mouth 3 (three) times daily as needed for muscle spasms., Disp: 30 tablet, Rfl: 1   feeding supplement (ENSURE ENLIVE / ENSURE PLUS) LIQD, Take 237 mLs by mouth 2 (two) times daily between meals., Disp: 237 mL, Rfl: 12   folic acid (FOLVITE) 1 MG tablet, TAKE ONE TABLET BY MOUTH EVERY DAY, Disp: 90 tablet, Rfl: 0   gabapentin (NEURONTIN) 100 MG capsule, Take 3 capsules (300  mg total) by mouth at bedtime., Disp: 180 capsule, Rfl: 2   lidocaine (LIDODERM) 5 %, Place 1 patch onto the skin daily. Remove & Discard patch within 12 hours or as directed by MD. Apply to back., Disp: , Rfl: 0   metoprolol tartrate (LOPRESSOR) 25 MG tablet, Take 0.5 tablets (12.5 mg total) by mouth 2 (two) times daily., Disp: 30 tablet, Rfl: 0   Multiple Vitamin (MULTIVITAMIN WITH MINERALS) TABS tablet, Take 1 tablet by mouth daily., Disp: 30 tablet, Rfl: 0   traZODone (DESYREL) 50 MG tablet, TAKE 1 TABLET BY MOUTH AT BEDTIME. SECOND TABLET IN THE MORNING AS NEEDED., Disp: 90 tablet, Rfl: 0  Allergies  Allergen Reactions   Ciprofloxacin Nausea And Vomiting and Hives   Codeine Anaphylaxis, Hives and Other (See Comments)    Reaction:  Unknown   Fluconazole Hives   Latex Anaphylaxis and Rash   Meperidine Anaphylaxis and Hives    Reaction:  Unknown  Reaction:  Unknown   Unknown   Morphine And  Codeine Hives, Anaphylaxis and Other (See Comments)    Reaction:  Unknown   unknown  Other reaction(s): Other (See Comments)  unknown   Doxycycline Nausea And Vomiting   Flagyl [Metronidazole] Nausea And Vomiting    Reaction:  Unknown    Influenza Vaccines Swelling    Localized swelling   Tape Other (See Comments)    Pt allergic to Adhesive tape and latex.(  Skin breaks out)   Valtrex [Valacyclovir Hcl] Hives   Buprenorphine     Other reaction(s): Other (see comments) unknown   Duloxetine Other (See Comments)    Other Reaction(s): GI intolerance  Reaction:  Unknown     Reaction:  Unknown     Reaction:  Unknown   Silicone Other (See Comments)    Pt allergic to Adhesive tape and latex.(  Skin breaks out)   Tramadol     Other reaction(s): Not available   Amoxicillin-Pot Clavulanate Nausea And Vomiting   Sulfamethoxazole-Trimethoprim Nausea And Vomiting and Rash   Valacyclovir Rash    I personally reviewed active problem list, medication list, allergies, family history, social  history, health maintenance, notes from last encounter, lab results, imaging with the patient/caregiver today.   Review of Systems  Constitutional: Negative.   HENT: Negative.    Eyes: Negative.   Respiratory: Negative.    Cardiovascular: Negative.   Gastrointestinal: Negative.   Endocrine: Negative.   Genitourinary: Negative.   Musculoskeletal: Negative.   Skin: Negative.   Allergic/Immunologic: Negative.   Neurological: Negative.   Hematological: Negative.   Psychiatric/Behavioral: Negative.    All other systems reviewed and are negative.    Objective:   Virtual encounter, vitals limited, only able to obtain the following There were no vitals filed for this visit. There is no height or weight on file to calculate BMI. Nursing Note and Vital Signs reviewed.  Physical Exam Vitals and nursing note reviewed.  Pulmonary:     Effort: No respiratory distress.  Neurological:     Mental Status: She is alert.  Psychiatric:        Mood and Affect: Mood normal.        Behavior: Behavior normal.     PE limited by telephone encounter  No results found for this or any previous visit (from the past 72 hour(s)).  Assessment and Plan:     ICD-10-CM   1. Dysuria  R30.0 Urine Culture    POCT urinalysis dipstick    Urine Culture    2. Urinary frequency  R35.0 Urine Culture    POCT urinalysis dipstick    3. Flank pain  R10.9      Urine in office was just dropped off and UA/dip done - positive for protein, blood, LE, neg nitrites Empiric coverage for possible UTI is what the pt and MPOA would like to do, and I explained that for now I do not 100% know if she has UTI, we will follow culture  If she does not improve or if she has worsening she was advised to come in person for f/up or go to [facility] - ED advised if severe pain, N/V, confusion/falls  Palliative care nurse - asked that Dr. Caralee Ates refer her back to the pain clinic.   -Red flags and when to present for emergency care  or RTC including chest pain, shortness of breath, new/worsening/un-resolving symptoms, reviewed with patient at time of visit. Follow up and care instructions discussed and provided in AVS. - I discussed the assessment and treatment plan with the  patient. The patient was provided an opportunity to ask questions and all were answered. The patient agreed with the plan and demonstrated an understanding of the instructions.  I provided 18 minutes of non-face-to-face time during this encounter.  Danelle Berry, PA-C 06/27/23 2:30 PM

## 2023-06-27 NOTE — Telephone Encounter (Signed)
Spoke with patient and she stated she would drop off specimen today before 4:30pm.

## 2023-06-27 NOTE — Telephone Encounter (Signed)
Requested Prescriptions  Pending Prescriptions Disp Refills   traZODone (DESYREL) 50 MG tablet [Pharmacy Med Name: TRAZODONE HCL 50 MG TAB] 90 tablet 0    Sig: TAKE 1 TABLET BY MOUTH AT BEDTIME. SECOND TABLET IN THE MORNING AS NEEDED.     Psychiatry: Antidepressants - Serotonin Modulator Passed - 06/25/2023  9:42 AM      Passed - Valid encounter within last 6 months    Recent Outpatient Visits           1 month ago Falls frequently   [facility] Margarita Mail, DO   2 months ago Hospital discharge follow-up   [facility] Margarita Mail, DO   6 months ago Need for influenza vaccination   [facility] Margarita Mail, DO   6 years ago Chronic pain syndrome    [facility] Gabriel Cirri, NP   6 years ago Generalized anxiety disorder   [facility] Alba Cory, MD       Future Appointments             In 1 month Margarita Mail, DO [facility]

## 2023-06-27 NOTE — Telephone Encounter (Signed)
Pt doing a virtual and bringing in a sample before her virtual visit

## 2023-06-27 NOTE — Patient Instructions (Addendum)
If there is any worsening of symptoms please go to the ER for evaluation (especially if you are unable to take antibiotics by mouth due to nausea or vomiting, worsening or severe abdominal or back/flank pain, confusion, near passing out episodes).  If you did a urine sample at home and it was not a clean catch or transferred to a tube right away then it will not give an accurate urine culture result.  If you have a mixed species result you may want to go to [facility] to do a second urine culture sample with a very clean catch technique - meaning you sit and wipe multiple times front to back, start urine stream into toilet and catch mid-urine stream and then that urine sample a urine culture is performed on it.    Urinary Tract Infection, Adult A urinary tract infection (UTI) is an infection of any part of the urinary tract. The urinary tract includes: The kidneys. The ureters. The bladder. The urethra. These organs make, store, and get rid of pee (urine) in the body. What are the causes? This infection is caused by germs (bacteria) in your genital area. These germs grow and cause swelling (inflammation) of your urinary tract. What increases the risk? The following factors may make you more likely to develop this condition: Using a small, thin tube (catheter) to drain pee. Not being able to control when you pee or poop (incontinence). Being female. If you are female, these things can increase the risk: Using these methods to prevent pregnancy: A medicine that kills sperm (spermicide). A device that blocks sperm (diaphragm). Having low levels of a female hormone (estrogen). Being pregnant. You are more likely to develop this condition if: You have genes that add to your risk. You are sexually active. You take antibiotic medicines. You have trouble peeing because of: A prostate that is bigger than normal, if you are female. A blockage in the part of your body that drains pee from the  bladder. A kidney stone. A nerve condition that affects your bladder. Not getting enough to drink. Not peeing often enough. You have other conditions, such as: Diabetes. A weak disease-fighting system (immune system). Sickle cell disease. Gout. Injury of the spine. What are the signs or symptoms? Symptoms of this condition include: Needing to pee right away. Peeing small amounts often. Pain or burning when peeing. Blood in the pee. Pee that smells bad or not like normal. Trouble peeing. Pee that is cloudy. Fluid coming from the vagina, if you are female. Pain in the belly or lower back. Other symptoms include: Vomiting. Not feeling hungry. Feeling mixed up (confused). This may be the first symptom in older adults. Being tired and grouchy (irritable). A fever. Watery poop (diarrhea). How is this treated? Taking antibiotic medicine. Taking other medicines. Drinking enough water. In some cases, you may need to see a specialist. Follow these instructions at home:  Medicines Take over-the-counter and prescription medicines only as told by your doctor. If you were prescribed an antibiotic medicine, take it as told by your doctor. Do not stop taking it even if you start to feel better. General instructions Make sure you: Pee until your bladder is empty. Do not hold pee for a long time. Empty your bladder after sex. Wipe from front to back after peeing or pooping if you are a female. Use each tissue one time when you wipe. Drink enough fluid to keep your pee pale yellow. Keep all follow-up visits. Contact a doctor if:  You do not get better after 1-2 days. Your symptoms go away and then come back. Get help right away if: You have very bad back pain. You have very bad pain in your lower belly. You have a fever. You have chills. You feeling like you will vomit or you vomit. Summary A urinary tract infection (UTI) is an infection of any part of the urinary tract. This  condition is caused by germs in your genital area. There are many risk factors for a UTI. Treatment includes antibiotic medicines. Drink enough fluid to keep your pee pale yellow. This information is not intended to replace advice given to you by your health care provider. Make sure you discuss any questions you have with your health care provider. Document Revised: 07/06/2020 Document Reviewed: 07/11/2020 Elsevier Patient Education  2024 [employer].

## 2023-06-29 ENCOUNTER — Ambulatory Visit: Payer: Self-pay | Admitting: *Deleted

## 2023-06-29 ENCOUNTER — Other Ambulatory Visit: Payer: Self-pay | Admitting: Internal Medicine

## 2023-06-29 DIAGNOSIS — M549 Dorsalgia, unspecified: Secondary | ICD-10-CM

## 2023-06-29 LAB — URINE CULTURE
MICRO NUMBER:: 15201935
SPECIMEN QUALITY:: ADEQUATE

## 2023-06-29 NOTE — Telephone Encounter (Signed)
  Chief Complaint: Back, leg pain Symptoms: Chronic back and leg pain, "Now I have kidney pain." Frequency: "Kidney pain, a while." Pertinent Negatives: Patient denies  Disposition: [] ED /[] Urgent Care (no appt availability in office) / [] Appointment(In office/virtual)/ []  [place] [facility]/ [] Home Care/ [] Refused Recommended Disposition /[] [place] [facility]/ [x]  Follow-up with PCP Additional Notes:   Pt requesting pain medication. States chronic pain is worsening, weakness, Can't stand up sometimes." States was referred to [employer] and [place] [facility]. "They do not write prescriptions." States she is frustrated "I just need some relief, I don't abuse meds because of my heart."  Pain 9/10 today.  Also requests urine CX results, not released to [facility].  Pt states cannot come in for appt, has to arrange transportation. Care advise provided, ED for worsening symptoms.  Pt verbalizes understanding, "But I won't go to ED, germs." Please advise.

## 2023-06-29 NOTE — Telephone Encounter (Signed)
Copied from CRM [id number]. Topic: General - Other >> Jun 29, 2023  8:35 AM Donna Evans wrote: Reason for CRM: Pt would like to discuss her urine results  Also says she is waiting for palliative care, she needs help with sleeping and pain. She says she wants a referral to hospice care because she feels like she is declining faster because she is not sleeping and she feels like she has given up. Wants to know why she has not been referred to the pain clinic, says she is unable to get up and down. Says she is 77 years old and would not abuse the medication, says this has been going on for 3 months. Says her PCP is not helping her at all and she is upset.   Transferred to NT.

## 2023-07-01 ENCOUNTER — Other Ambulatory Visit: Payer: Self-pay | Admitting: Internal Medicine

## 2023-07-01 ENCOUNTER — Ambulatory Visit: Payer: PPO | Admitting: Internal Medicine

## 2023-07-01 ENCOUNTER — Ambulatory Visit: Payer: Self-pay

## 2023-07-01 DIAGNOSIS — R300 Dysuria: Secondary | ICD-10-CM

## 2023-07-01 DIAGNOSIS — R10A0 Flank pain, unspecified side: Secondary | ICD-10-CM

## 2023-07-01 DIAGNOSIS — R350 Frequency of micturition: Secondary | ICD-10-CM

## 2023-07-01 DIAGNOSIS — R109 Unspecified abdominal pain: Secondary | ICD-10-CM

## 2023-07-01 MED ORDER — CEPHALEXIN 500 MG PO CAPS
500.0000 mg | ORAL_CAPSULE | Freq: Two times a day (BID) | ORAL | 0 refills | Status: AC
Start: 2023-07-01 — End: 2023-07-03

## 2023-07-01 NOTE — Telephone Encounter (Signed)
Patient notified

## 2023-07-01 NOTE — Telephone Encounter (Signed)
Message from Alessandra Bevels sent at 07/01/2023  8:15 AM EDT  Summary: UTI concerns advice   Pt is calling to report that she is still continuing to have UTI sx requesting another round of antibotic. Pt states that she has a friend that can pick up paper script and take it to [facility] if having computer problems.         Chief Complaint: pain with urination Symptoms: severe flank pain, pain with urination Frequency: since Last Friday Pertinent Negatives: Patient denies blood in urine/fever (but woke up in a sweat), weakness  Disposition: [] ED /[] Urgent Care (no appt availability in office) / [] Appointment(In office/virtual)/ []  [place] [facility]/ [] Home Care/ [x] Refused Recommended Disposition /[] [place] [facility]/ [x]  Follow-up with PCP Additional Notes: pt stated that she has a 1.5 days of abx left but due to sx did not want to wait the weekend. Pt stated she took BP and was normal as week as HR. Pt stated that she can have someone pick up script after 1500. Called pt back to offer appt 1120 - pt refused stated that she has no transportation. Pt stated that her nephew is supposed to come see her. Advised pt to call office back and see if appt and if not appt advised UC. Pt reluctant. Advised pt d/t severe flank pain, concern for getting worse.   Reason for Disposition  [1] SEVERE pain (e.g., excruciating) AND [2] no improvement 2 hours after pain medications  Answer Assessment - Initial Assessment Questions 1. SYMPTOM: "What's the main symptom you're concerned about?" (e.g., frequency, incontinence)     Flank pain, burning with urination, overall dont feel  2. ONSET: "When did the  sx  start?"     Last Friday  3. PAIN: "Is there any pain?" If Yes, ask: "How bad is it?" (Scale: 1-10; mild, moderate, severe)     8/10 4. CAUSE: "What do you think is causing the symptoms?"     UTI 5. OTHER SYMPTOMS: "Do you have any other symptoms?" (e.g., blood in urine, fever, flank pain, pain with  urination)     Flank pain, woke up sweating, feels weak  Protocols used: Urinary Symptoms-A-AH, Urinary Tract Infection on Antibiotic Follow-up Call - [facility]

## 2023-07-04 ENCOUNTER — Ambulatory Visit: Payer: Self-pay | Admitting: *Deleted

## 2023-07-04 NOTE — Telephone Encounter (Signed)
Message from Donna Evans sent at 07/04/2023  8:30 AM EDT  Summary: reporting as directed, still having symptoms   Patient is reporting as directed, last antibiotic pill is today, still burning when urinate, fever off and on, bad sweats, no longer having chills.          Call History  Contact Date/Time Type Contact Phone/Fax User  07/04/2023 08:27 AM EDT Phone (Incoming) [name], Donna Evans (Self) [phone] Judie PetitLillia Mountain E   Reason for Disposition  [1] Taking antibiotic > 72 hours (3 days) for UTI AND [2] flank or lower back pain is SAME (unchanged, not better)  Answer Assessment - Initial Assessment Questions 1. MAIN SYMPTOM: "What is the main symptom you are concerned about?" (e.g., painful urination, urine frequency)     My UTI symptoms are much better but still there.    I'm still burning with urination but it's not as bad.    I took my last antibiotic this morning.   I'm having sweats.   No chills.   I'm still having the right lower back pain.   It's not as bad either but still there. I did a video visit with another dr.   It wasn't Dr. Caralee Ates.   (Per chart it was with Danelle Berry, PA-C on 06/27/2023).   Pt brought in a urine sample and was told during the video visit she did have a UTI.     The symptoms are better but still there.      I don't have transportation to come in.  I don't drive so either someone has to come from [place] or [place] to bring me to an appt.   I have to give them advance notice.   (Note in chart indicated if she was still having symptoms that she would need to come in for an office visit).   Can she call me in an antibiotic in or just extend the time to be on the antibiotic I'm on?  My urine is not as dark.   It was looking like tea.     2. BETTER-SAME-WORSE: "Are you getting better, staying the same, or getting worse compared to how you felt at your last visit to the doctor (most recent medical visit)?"     All of my urinary symptoms  are better but still there. 3. PAIN: "How bad is the pain?"  (e.g., Scale 1-10; mild, moderate, or severe)   - MILD (1-3): complains slightly about urination hurting   - MODERATE (4-7): interferes with normal activities     - SEVERE (8-10): excruciating, unwilling or unable to urinate because of the pain      Still having right lower back pain and burning with urination 4. FEVER: "Do you have a fever?" If Yes, ask: "What is it, how was it measured, and when did it start?"     I'm having sweats.    No longer having chills 5. OTHER SYMPTOMS: "Do you have any other symptoms?" (e.g., blood in the urine, flank pain, vaginal discharge)     Right flank pain, sweating. 6. DIAGNOSIS: "When was the UTI diagnosed?" "By whom?" "Was it a kidney infection, bladder infection or both?"     06/27/2023 via a video visit with Danelle Berry, PA-C.    Pt had someone bring in a sample of her urine earlier that day and it was positive for a UTI. 7. ANTIBIOTIC: "What antibiotic(s) are you taking?" "How many times per day?"     I  took my last antibiotic today.   8. ANTIBIOTIC - START DATE: "When did you start taking the antibiotic?"     Not asked.   Video visit was 06/27/2023  Protocols used: Urinary Tract Infection on Antibiotic Follow-up Call - [facility]

## 2023-07-04 NOTE — Telephone Encounter (Signed)
Tried calling pt no answer °

## 2023-07-04 NOTE — Telephone Encounter (Signed)
Pt unable to come to clinic due to transportation.

## 2023-07-04 NOTE — Telephone Encounter (Signed)
  Chief Complaint: Video visit done with Donna Berry, PA-C on 06/27/2023 for UTI. Symptoms: Symptoms are better but still there.   Took her last antibiotic this morning.   Per office notes she is to come in if her symptoms persisted however she does not have transportation.    Pt. Requesting the antibiotic be extended for a few more days. Still burning with urination, having right flank pain, frequency, sweats, but no chills.   Frequency: Daily symptoms.   Pertinent Negatives: Patient denies her urine being tea colored now.   It was in the beginning. Disposition: [] ED /[] Urgent Care (no appt availability in office) / [] Appointment(In office/virtual)/ []  [place] [facility]/ [] Home Care/ [] Refused Recommended Disposition /[] [place] [facility]/ [x]  Follow-up with PCP Additional Notes: Message sent to Donna Berry, PA-C with pt's request.   She has to give advance notice in order to get a ride anywhere.  Pt. Was agreeable to someone calling her back.

## 2023-07-05 ENCOUNTER — Ambulatory Visit: Payer: Self-pay | Admitting: *Deleted

## 2023-07-05 NOTE — Telephone Encounter (Signed)
Copied from CRM [id number]. Topic: General - Other >> Jul 05, 2023 11:21 AM Macon Large wrote: Reason for CRM: Pt requests that Dr. Caralee Ates turn her over to hospice care.

## 2023-07-05 NOTE — Telephone Encounter (Signed)
Opened in error; Disregard.

## 2023-07-06 ENCOUNTER — Encounter: Payer: Self-pay | Admitting: Internal Medicine

## 2023-07-06 ENCOUNTER — Ambulatory Visit: Payer: PPO | Admitting: Internal Medicine

## 2023-07-06 ENCOUNTER — Ambulatory Visit
Admission: RE | Admit: 2023-07-06 | Discharge: 2023-07-06 | Disposition: A | Discharge status: Home / Self Care | Payer: PPO | Source: Ambulatory Visit | Attending: Internal Medicine | Admitting: Internal Medicine

## 2023-07-06 VITALS — BP 112/68 | HR 60 | Temp 97.7°F | Resp 18 | Ht 67.0 in | Wt 108.0 lb

## 2023-07-06 DIAGNOSIS — N3090 Cystitis, unspecified without hematuria: Secondary | ICD-10-CM

## 2023-07-06 DIAGNOSIS — G4700 Insomnia, unspecified: Secondary | ICD-10-CM | POA: Diagnosis not present

## 2023-07-06 DIAGNOSIS — M5442 Lumbago with sciatica, left side: Secondary | ICD-10-CM | POA: Diagnosis not present

## 2023-07-06 DIAGNOSIS — M5441 Lumbago with sciatica, right side: Secondary | ICD-10-CM | POA: Diagnosis not present

## 2023-07-06 DIAGNOSIS — R300 Dysuria: Secondary | ICD-10-CM

## 2023-07-06 DIAGNOSIS — N12 Tubulo-interstitial nephritis, not specified as acute or chronic: Secondary | ICD-10-CM | POA: Diagnosis not present

## 2023-07-06 DIAGNOSIS — R109 Unspecified abdominal pain: Secondary | ICD-10-CM

## 2023-07-06 DIAGNOSIS — D3502 Benign neoplasm of left adrenal gland: Secondary | ICD-10-CM | POA: Diagnosis not present

## 2023-07-06 DIAGNOSIS — R509 Fever, unspecified: Secondary | ICD-10-CM | POA: Insufficient documentation

## 2023-07-06 LAB — POCT URINALYSIS DIPSTICK
Bilirubin, UA: NEGATIVE
Blood, UA: POSITIVE
Glucose, UA: NEGATIVE
Ketones, UA: NEGATIVE
Nitrite, UA: POSITIVE
Protein, UA: POSITIVE — AB
Spec Grav, UA: 1.015 (ref 1.010–1.025)
Urobilinogen, UA: 0.2 E.U./dL
pH, UA: 6 (ref 5.0–8.0)

## 2023-07-06 LAB — CBC WITH DIFFERENTIAL/PLATELET
Absolute Monocytes: 806 cells/uL (ref 200–950)
Basophils Absolute: 50 cells/uL (ref 0–200)
Basophils Relative: 0.4 %
Eosinophils Absolute: 214 cells/uL (ref 15–500)
Eosinophils Relative: 1.7 %
HCT: 34.6 % — ABNORMAL LOW (ref 35.0–45.0)
Hemoglobin: 11.6 g/dL — ABNORMAL LOW (ref 11.7–15.5)
MCH: 31.4 pg (ref 27.0–33.0)
MCV: 93.8 fL (ref 80.0–100.0)
Monocytes Relative: 6.4 %
RDW: 13.5 % (ref 11.0–15.0)
Total Lymphocyte: 14.2 %
WBC: 12.6 10*3/uL — ABNORMAL HIGH (ref 3.8–10.8)

## 2023-07-06 MED ORDER — CEFTRIAXONE SODIUM 500 MG IJ SOLR
500.0000 mg | Freq: Once | INTRAMUSCULAR | Status: AC
Start: 2023-07-06 — End: 2023-07-06
  Administered 2023-07-06: 1000 mg via INTRAMUSCULAR

## 2023-07-06 MED ORDER — CYCLOBENZAPRINE HCL 5 MG PO TABS
5.0000 mg | ORAL_TABLET | Freq: Three times a day (TID) | ORAL | 1 refills | Status: DC | PRN
Start: 2023-07-06 — End: 2024-02-11

## 2023-07-06 MED ORDER — CEFPODOXIME PROXETIL 200 MG PO TABS
200.0000 mg | ORAL_TABLET | Freq: Two times a day (BID) | ORAL | 0 refills | Status: AC
Start: 2023-07-06 — End: 2023-07-16

## 2023-07-06 MED ORDER — LIDOCAINE HCL (PF) 1 % IJ SOLN
2.0000 mL | Freq: Once | INTRAMUSCULAR | Status: AC
Start: 2023-07-06 — End: 2023-07-06
  Administered 2023-07-06: 2 mL via INTRADERMAL

## 2023-07-06 MED ORDER — CEFTRIAXONE SODIUM 1 G IJ SOLR
1.0000 g | Freq: Once | INTRAMUSCULAR | Status: DC
Start: 2023-07-06 — End: 2023-07-06

## 2023-07-06 MED ORDER — TRAZODONE HCL 50 MG PO TABS
50.0000 mg | ORAL_TABLET | Freq: Every day | ORAL | 0 refills | Status: DC
Start: 2023-07-06 — End: 2023-07-13

## 2023-07-06 MED ORDER — LIDOCAINE HCL (PF) 1 % IJ SOLN
2.0000 mL | Freq: Once | INTRAMUSCULAR | Status: DC
Start: 2023-07-06 — End: 2023-07-06

## 2023-07-06 NOTE — Progress Notes (Signed)
Acute Office Visit  Subjective:     Patient ID: Donna Evans, female    DOB: 02/21/1946, 77 y.o.   MRN: [id number]  Chief Complaint  Patient presents with   Urinary Tract Infection    Urinary Tract Infection  Associated symptoms include chills, flank pain, frequency and urgency. Pertinent negatives include no hematuria.   Patient is in today for follow up on UTI. Patient had dysuria starting 2 weeks ago, was unable to be seen in the office due to transportation issues, urine sample dropped off and she had virtual visit where she was diagnosed with UTI and treated with Keflex x 7 days. Patient finished antibiotic on Monday, still having burning and now with severe right flank pain and elevated temperatures. Having incomplete emptying, small voids, increased urinary urgency and frequency. No hematuria. Has multiple allergies to antibiotics.   Review of Systems  Constitutional:  Positive for chills and fever.  Respiratory:  Negative for shortness of breath.   Cardiovascular:  Negative for chest pain.  Genitourinary:  Positive for dysuria, flank pain, frequency and urgency. Negative for hematuria.        Objective:    BP 112/68   Pulse 60   Temp 97.7 F (36.5 C)   Resp 18   Ht 5\' 7"  (1.702 m)   Wt 108 lb (49 kg)   SpO2 100%   BMI 16.92 kg/m  BP Readings from Last 3 Encounters:  07/06/23 112/68  05/24/23 120/70  04/25/23 112/70   Wt Readings from Last 3 Encounters:  07/06/23 108 lb (49 kg)  05/31/23 106 lb 9.6 oz (48.4 kg)  05/24/23 108 lb (49 kg)      Physical Exam Constitutional:      Appearance: Normal appearance.  HENT:     Head: Normocephalic and atraumatic.  Eyes:     Conjunctiva/sclera: Conjunctivae normal.  Cardiovascular:     Rate and Rhythm: Normal rate and regular rhythm.  Pulmonary:     Effort: Pulmonary effort is normal.     Breath sounds: Normal breath sounds.  Abdominal:     Tenderness: There is right CVA tenderness. There is no left CVA  tenderness.  Skin:    General: Skin is warm and dry.  Neurological:     General: No focal deficit present.     Mental Status: She is alert. Mental status is at baseline.  Psychiatric:        Mood and Affect: Mood normal.        Behavior: Behavior normal.     Results for orders placed or performed in visit on 07/06/23  POCT urinalysis dipstick  Result Value Ref Range   Color, UA yellow    Clarity, UA cloudy    Glucose, UA Negative Negative   Bilirubin, UA neg    Ketones, UA neg    Spec Grav, UA 1.015 1.010 - 1.025   Blood, UA positive    pH, UA 6.0 5.0 - 8.0   Protein, UA Positive (A) Negative   Urobilinogen, UA 0.2 0.2 or 1.0 E.U./dL   Nitrite, UA positive    Leukocytes, UA Large (3+) (A) Negative   Appearance cloudy    Odor foul         Assessment & Plan:   1. Cystitis/Burning with urination/Right flank pain/Fever, unspecified fever cause: UA positive for nitrates, leukocytes, blood. Send for another urine culture, one last week showing pan sensitive e. Coli. Concern for pyelonephritis, obtaining stat CT, labs. Has multiple drug allergies,  treated with Keflex but urine looks worse today after finishing 7 days of treatment. Rocephin 1 g IM today, may require IV antibiotics inpatient if scan consistent with pyelo.   - CBC w/Diff/Platelet - Basic Metabolic Panel (BMET) - CT RENAL STONE STUDY; Future - POCT urinalysis dipstick - Urine Culture  2. Bilateral low back pain with bilateral sciatica, unspecified chronicity: Refill Flexeril.   - cyclobenzaprine (FLEXERIL) 5 MG tablet; Take 1 tablet (5 mg total) by mouth 3 (three) times daily as needed for muscle spasms.  Dispense: 30 tablet; Refill: 1  3. Insomnia, unspecified type: Refill Trazodone.   - traZODone (DESYREL) 50 MG tablet; Take 1 tablet (50 mg total) by mouth at bedtime.  Dispense: 90 tablet; Refill: 0   Return if symptoms worsen or fail to improve.  Margarita Mail, DO

## 2023-07-06 NOTE — Addendum Note (Signed)
Addended by: Davene Costain on: 07/06/2023 12:04 PM   Modules accepted: Orders

## 2023-07-06 NOTE — Addendum Note (Signed)
Addended by: Margarita Mail on: 07/06/2023 01:59 PM   Modules accepted: Orders

## 2023-07-07 LAB — CBC WITH DIFFERENTIAL/PLATELET
Lymphs Abs: 1789 cells/uL (ref 850–3900)
MCHC: 33.5 g/dL (ref 32.0–36.0)
MPV: 10.3 fL (ref 7.5–12.5)
Neutro Abs: 9740 cells/uL — ABNORMAL HIGH (ref 1500–7800)
Neutrophils Relative %: 77.3 %
Platelets: 376 10*3/uL (ref 140–400)
RBC: 3.69 10*6/uL — ABNORMAL LOW (ref 3.80–5.10)

## 2023-07-07 LAB — BASIC METABOLIC PANEL
BUN/Creatinine Ratio: 22 (calc) (ref 6–22)
BUN: 28 mg/dL — ABNORMAL HIGH (ref 7–25)
Creat: 1.29 mg/dL — ABNORMAL HIGH (ref 0.60–1.00)
Glucose, Bld: 89 mg/dL (ref 65–99)
Potassium: 3.8 mmol/L (ref 3.5–5.3)

## 2023-07-08 ENCOUNTER — Ambulatory Visit: Payer: Self-pay | Admitting: *Deleted

## 2023-07-08 NOTE — Telephone Encounter (Signed)
Summary: Rx increase   Pt stated that she usually takes 2 pills of the traZODone (DESYREL) 50 MG tablet at bedtime so she is requesting that the Rx be increased to reflect 2 pills at bedtime. Cb# [phone]           Chief Complaint: requesting Rx change for trazodone 50 mg  Symptoms: not able to sleep with 1 tablet - 50 mg trazodone. Takes 2 tablets to equal 100 mg at night and then sometimes does not sleep well Frequency: "for a while" Pertinent Negatives: Patient denies na  Disposition: [] ED /[] Urgent Care (no appt availability in office) / [] Appointment(In office/virtual)/ []  [place] [facility]/ [] Home Care/ [] Refused Recommended Disposition /[] [place] [facility]/ [x]  Follow-up with PCP Additional Notes:   Requesting prescription to change to state can take 2 tablets - 50 mg to total 100 mg at night for sleep. Please advise      Reason for Disposition  Prescription request for new medicine (not a refill)  Answer Assessment - Initial Assessment Questions 1. NAME of MEDICINE: "What medicine(s) are you calling about?"     Trazodone 50 mg  2. QUESTION: "What is your question?" (e.g., double dose of medicine, side effect)     Can prescription be changed to take 2 tablets 50 mg each, to total 100 mg at night? 3. PRESCRIBER: "Who prescribed the medicine?" Reason: if prescribed by specialist, call should be referred to that group.     PCP 4. SYMPTOMS: "Do you have any symptoms?" If Yes, ask: "What symptoms are you having?"  "How bad are the symptoms (e.g., mild, moderate, severe)     Needs 2 tablets to help sleep and sometimes that doesn't work  5. PREGNANCY:  "Is there any chance that you are pregnant?" "When was your last menstrual period?"     na  Protocols used: Medication Question Call-A-AH

## 2023-07-12 NOTE — Progress Notes (Unsigned)
   Acute Office Visit  Subjective:     Patient ID: Donna Evans, female    DOB: Jan 29, 1946, 77 y.o.   MRN: [id number]  No chief complaint on file.   Urinary Tract Infection  Associated symptoms include chills, flank pain, frequency and urgency. Pertinent negatives include no hematuria.   Patient is in today for follow up on pyelonephritis.  Patient was initially treated with Keflex, however symptoms did not resolve.  Urine last week growing Pseudomonas UTI, she was treated in the office with ceftriaxone IM 1 g and she has been taking cefpodoxime 200 mg twice daily since that time.  CT from 07/06/2023 showing mild right perinephritic fat stranding.  Review of Systems  Constitutional:  Positive for chills and fever.  Respiratory:  Negative for shortness of breath.   Cardiovascular:  Negative for chest pain.  Genitourinary:  Positive for dysuria, flank pain, frequency and urgency. Negative for hematuria.        Objective:    There were no vitals taken for this visit. BP Readings from Last 3 Encounters:  07/06/23 112/68  05/24/23 120/70  04/25/23 112/70   Wt Readings from Last 3 Encounters:  07/06/23 108 lb (49 kg)  05/31/23 106 lb 9.6 oz (48.4 kg)  05/24/23 108 lb (49 kg)      Physical Exam Constitutional:      Appearance: Normal appearance.  HENT:     Head: Normocephalic and atraumatic.  Eyes:     Conjunctiva/sclera: Conjunctivae normal.  Cardiovascular:     Rate and Rhythm: Normal rate and regular rhythm.  Pulmonary:     Effort: Pulmonary effort is normal.     Breath sounds: Normal breath sounds.  Abdominal:     Tenderness: There is right CVA tenderness. There is no left CVA tenderness.  Skin:    General: Skin is warm and dry.  Neurological:     General: No focal deficit present.     Mental Status: She is alert. Mental status is at baseline.  Psychiatric:        Mood and Affect: Mood normal.        Behavior: Behavior normal.     No results found for any  visits on 07/13/23.       Assessment & Plan:   1. Cystitis/Burning with urination/Right flank pain/Fever, unspecified fever cause: UA positive for nitrates, leukocytes, blood. Send for another urine culture, one last week showing pan sensitive e. Coli. Concern for pyelonephritis, obtaining stat CT, labs. Has multiple drug allergies, treated with Keflex but urine looks worse today after finishing 7 days of treatment. Rocephin 1 g IM today, may require IV antibiotics inpatient if scan consistent with pyelo.   - CBC w/Diff/Platelet - Basic Metabolic Panel (BMET) - CT RENAL STONE STUDY; Future - POCT urinalysis dipstick - Urine Culture  2. Bilateral low back pain with bilateral sciatica, unspecified chronicity: Refill Flexeril.   - cyclobenzaprine (FLEXERIL) 5 MG tablet; Take 1 tablet (5 mg total) by mouth 3 (three) times daily as needed for muscle spasms.  Dispense: 30 tablet; Refill: 1  3. Insomnia, unspecified type: Refill Trazodone.   - traZODone (DESYREL) 50 MG tablet; Take 1 tablet (50 mg total) by mouth at bedtime.  Dispense: 90 tablet; Refill: 0   No follow-ups on file.  Margarita Mail, DO

## 2023-07-13 ENCOUNTER — Ambulatory Visit (INDEPENDENT_AMBULATORY_CARE_PROVIDER_SITE_OTHER): Payer: PPO | Admitting: Internal Medicine

## 2023-07-13 ENCOUNTER — Telehealth: Payer: Self-pay | Admitting: Internal Medicine

## 2023-07-13 ENCOUNTER — Encounter: Payer: Self-pay | Admitting: Internal Medicine

## 2023-07-13 VITALS — BP 128/78 | HR 56 | Temp 98.1°F | Resp 16 | Ht 67.0 in | Wt 109.8 lb

## 2023-07-13 DIAGNOSIS — R300 Dysuria: Secondary | ICD-10-CM | POA: Diagnosis not present

## 2023-07-13 DIAGNOSIS — G4700 Insomnia, unspecified: Secondary | ICD-10-CM | POA: Diagnosis not present

## 2023-07-13 DIAGNOSIS — M545 Low back pain, unspecified: Secondary | ICD-10-CM | POA: Diagnosis not present

## 2023-07-13 DIAGNOSIS — N12 Tubulo-interstitial nephritis, not specified as acute or chronic: Secondary | ICD-10-CM

## 2023-07-13 LAB — POCT URINALYSIS DIPSTICK
Bilirubin, UA: NEGATIVE
Glucose, UA: NEGATIVE
Ketones, UA: NEGATIVE
Nitrite, UA: NEGATIVE
Protein, UA: NEGATIVE
Spec Grav, UA: 1.02 (ref 1.010–1.025)
Urobilinogen, UA: 0.2 E.U./dL
pH, UA: 5 (ref 5.0–8.0)

## 2023-07-13 MED ORDER — TRAZODONE HCL 100 MG PO TABS
100.0000 mg | ORAL_TABLET | Freq: Every day | ORAL | 1 refills | Status: DC
Start: 2023-07-13 — End: 2023-09-30

## 2023-07-13 NOTE — Telephone Encounter (Unsigned)
Copied from CRM [id number]. Topic: General - Other >> Jul 13, 2023  3:54 PM Dondra Prader E wrote: Reason for CRM: Melissa Bean called requesting to speak to a nurse, she is from hospice   Best contact: [phone]

## 2023-07-14 ENCOUNTER — Other Ambulatory Visit: Payer: Self-pay | Admitting: Internal Medicine

## 2023-07-14 ENCOUNTER — Telehealth: Payer: Self-pay | Admitting: Internal Medicine

## 2023-07-14 DIAGNOSIS — G894 Chronic pain syndrome: Secondary | ICD-10-CM

## 2023-07-14 NOTE — Telephone Encounter (Signed)
Not a pt a [employer]

## 2023-07-14 NOTE — Telephone Encounter (Signed)
[place] called from [employer] stated they are not able to provide service to patient per the contract is under review. Please reach out to patient to let her know so she is not waiting for them to show up.

## 2023-07-20 ENCOUNTER — Telehealth (INDEPENDENT_AMBULATORY_CARE_PROVIDER_SITE_OTHER): Payer: Self-pay

## 2023-07-20 NOTE — Telephone Encounter (Signed)
This was received from Sherlyn Lick at [employer]:  HI Vernona Rieger and [place], on 7/31 I received the signed forms for this patient's pump. That same day we sent all of her notes and signed forms to our billing partner for her insurance (it's a very rare insurance, so we didn't bother getting in network ourselves).  The patient called [place], who works with me, today very upset because she called her insurance and they didn't see anything under [employer]. I just spoke with her and she refuses to listen to anything I say and talks over me when I try to speak.  Everything has been sent to the billing partner for insurance approval. I just wanted to make you aware in case she calls to complain. She said she was going to call you and tell you to send to another company that would be faster. Even though I explained to her that since receiving the order from you, it has taken [place] 3 business days to do our part to get the pump approved for her.  Sorry about this, but unfortunately this patient is extremely difficult.   Kristie Cowman Sr. Media planner [employer]

## 2023-07-29 ENCOUNTER — Other Ambulatory Visit: Payer: Self-pay | Admitting: Internal Medicine

## 2023-07-29 DIAGNOSIS — G4709 Other insomnia: Secondary | ICD-10-CM

## 2023-08-03 DIAGNOSIS — I890 Lymphedema, not elsewhere classified: Secondary | ICD-10-CM | POA: Diagnosis not present

## 2023-08-23 ENCOUNTER — Ambulatory Visit: Payer: PPO | Admitting: Internal Medicine

## 2023-08-23 ENCOUNTER — Encounter: Payer: Self-pay | Admitting: Internal Medicine

## 2023-08-28 IMAGING — MR MR HEAD W/O CM
12 series · 48 of 48 positions shown · non-contrast
Comparison: 09/30/2016

CLINICAL DATA: Altered mental status.  Confusion.

EXAM:
MRI HEAD WITHOUT CONTRAST
TECHNIQUE: Multiplanar, multiecho pulse sequences of the brain and surrounding
structures were obtained without intravenous contrast.

[Series 5: ax dwi_tracew · axial · 3.0mm · 0.65mm/px · z∈[-94,+59]mm · 3 of 48 slices shown]
[im 1/48]
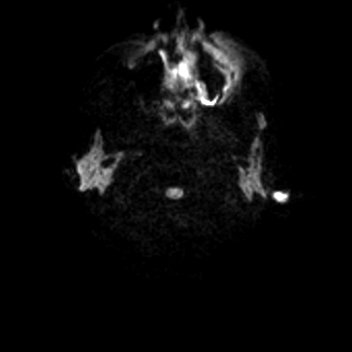
[im 24/48]
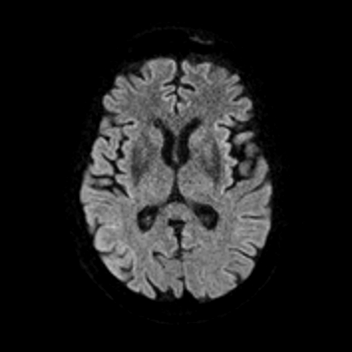
[im 48/48]
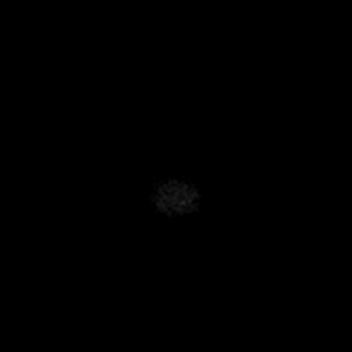

[Series 6: ax dwi_adc · axial · 3.0mm · 0.65mm/px · z∈[-94,+59]mm · 3 of 47 slices shown]
[im 1/47]
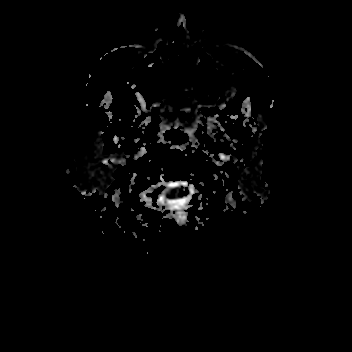
[im 24/47]
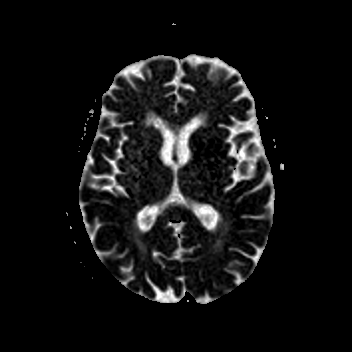
[im 47/47]
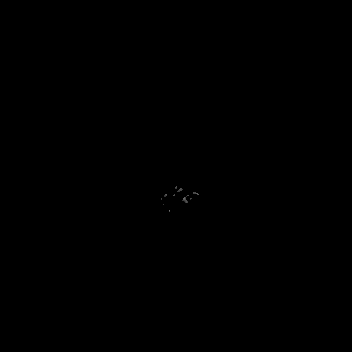

[Series 7: cor dwi_tracew · coronal · 5.0mm · 0.65mm/px · 3 of 36 slices shown]
[im 1/36]
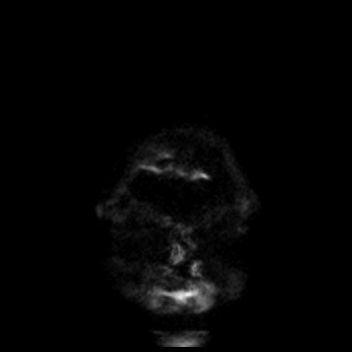
[im 18/36]
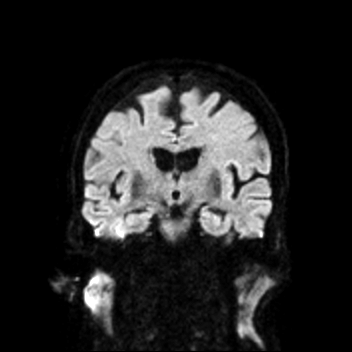
[im 36/36]
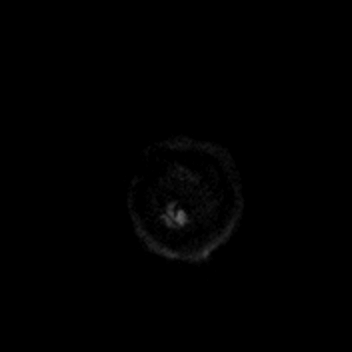

[Series 8: cor dwi_adc · coronal · 5.0mm · 0.65mm/px · 3 of 36 slices shown]
[im 1/36]
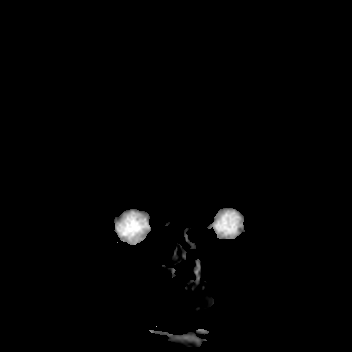
[im 18/36]
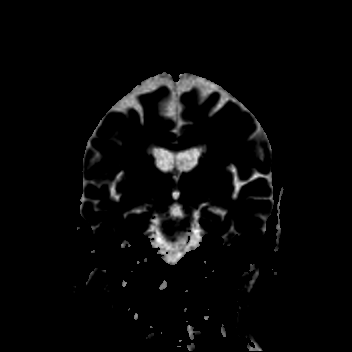
[im 36/36]
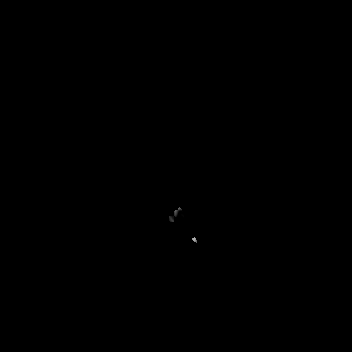

[Series 9: T1 · sagittal · 5.0mm · 0.62mm/px · 2 of 20 slices shown (1 of 2)]
[im 1/20]
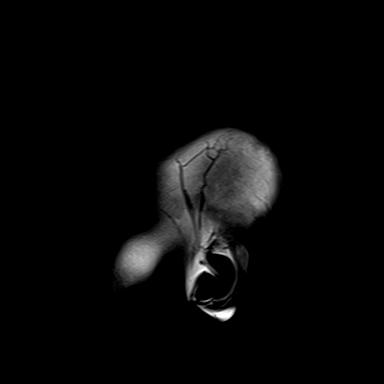
[im 20/20]
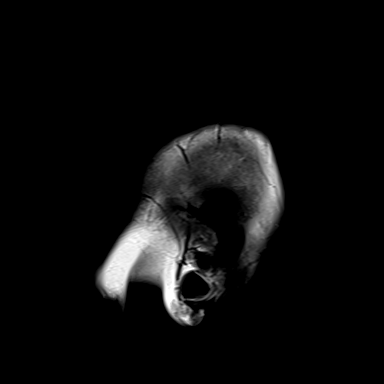

[Series 10: T2 · axial · 5.0mm · 0.53mm/px · z∈[-91,+57]mm · 2 of 26 slices shown (1 of 2)]
[im 1/26]
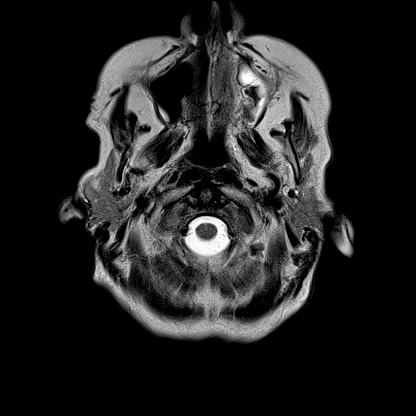
[im 26/26]
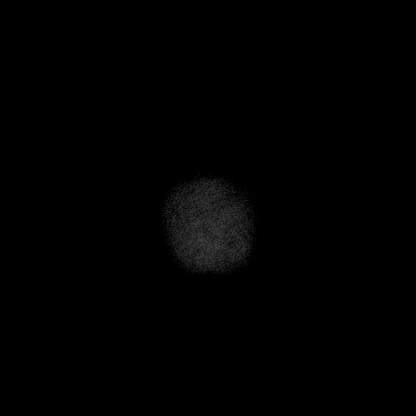

[Series 11: mag_images · axial · 3.0mm · 0.90mm/px · z∈[-105,+70]mm · 5 of 60 slices shown]
[im 1/60]
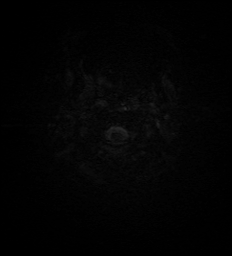
[im 15/60]
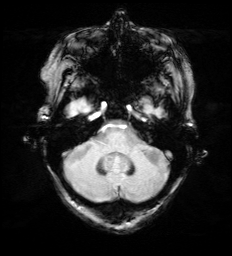
[im 30/60]
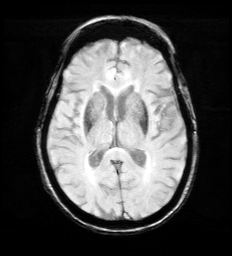
[im 45/60]
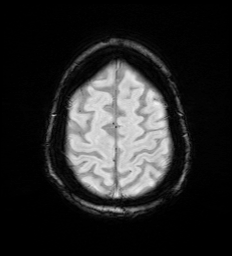
[im 60/60]
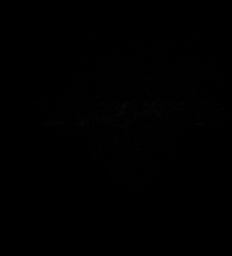

[Series 12: pha_images · axial · 3.0mm · 0.90mm/px · z∈[-105,+61]mm · 4 of 57 slices shown]
[im 1/57]
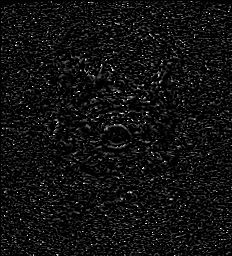
[im 19/57]
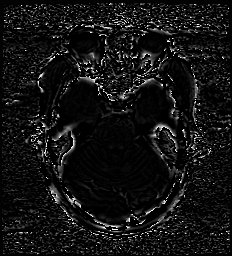
[im 38/57]
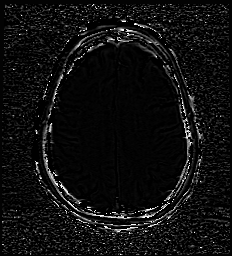
[im 57/57]
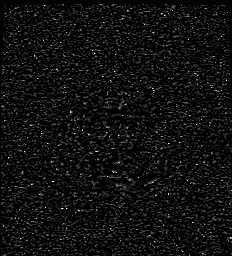

[Series 13: swi_images · axial · 3.0mm · 0.90mm/px · z∈[-105,+70]mm · 5 of 60 slices shown]
[im 1/60]
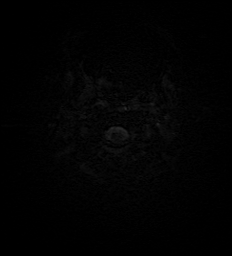
[im 15/60]
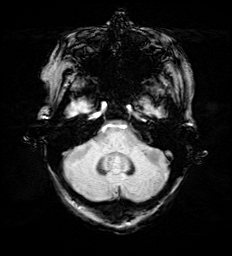
[im 30/60]
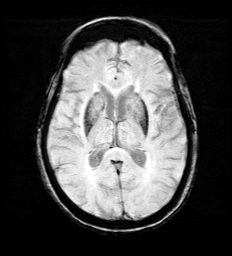
[im 45/60]
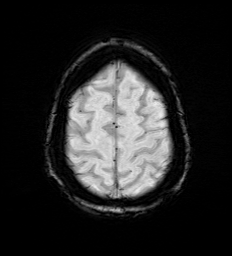
[im 60/60]
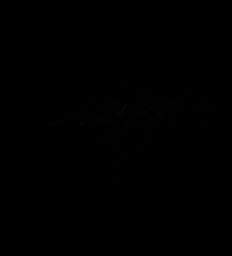

[Series 15: FLAIR · axial · 3.0mm · 0.53mm/px · z∈[-88,+54]mm · 4 of 49 slices shown]
[im 1/49]
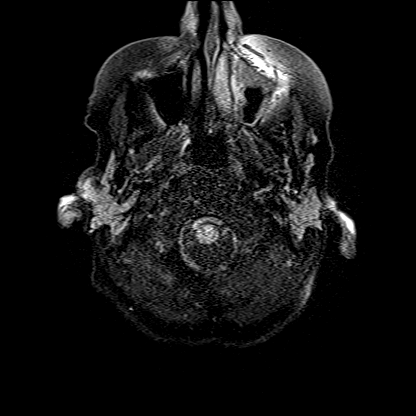
[im 17/49]
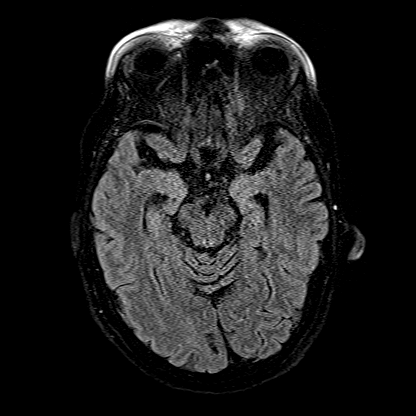
[im 33/49]
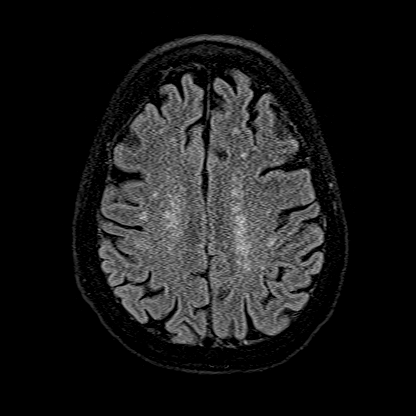
[im 49/49]
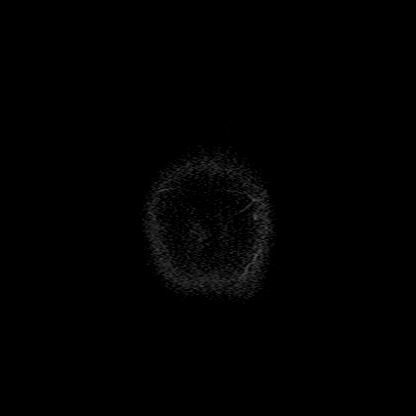

[Series 16: T1 · axial · 1.0mm · 0.98mm/px · z∈[-98,+59]mm · 12 of 159 slices shown (2 of 2)]
[im 1/159]
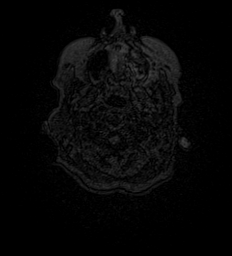
[im 15/159]
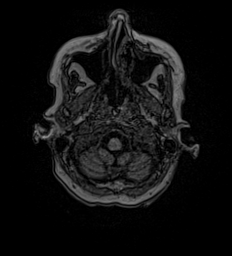
[im 29/159]
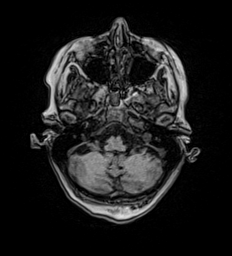
[im 44/159]
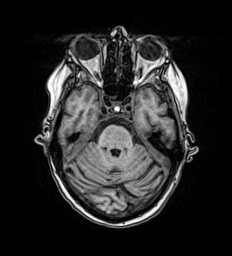
[im 58/159]
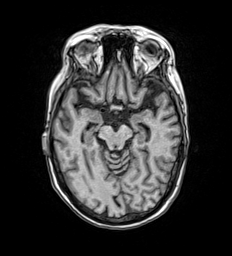
[im 72/159]
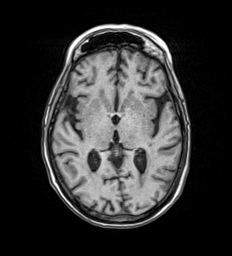
[im 87/159]
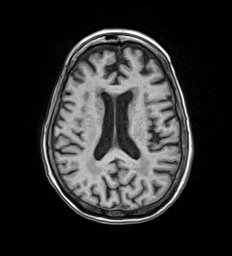
[im 101/159]
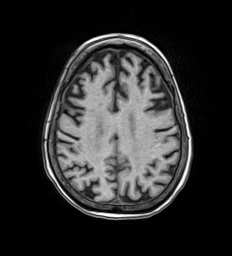
[im 115/159]
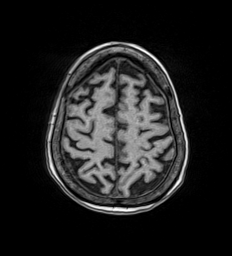
[im 130/159]
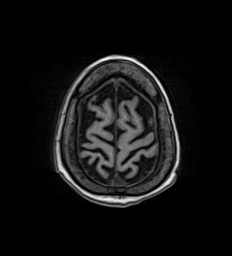
[im 144/159]
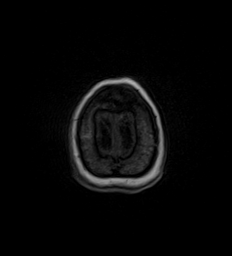
[im 159/159]
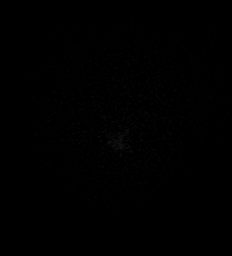

[Series 17: T2 · coronal · 5.0mm · 0.57mm/px · 2 of 28 slices shown (2 of 2)]
[im 1/28]
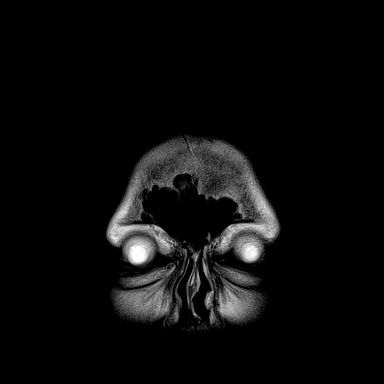
[im 28/28]
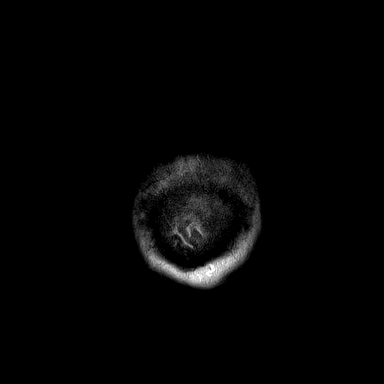

[48 of 48 positions shown; findings below may reference images not displayed]

FINDINGS: Brain: No acute infarct, mass effect or extra-axial collection. No
acute or chronic hemorrhage. There is multifocal hyperintense
T2-weighted signal within the white matter. Generalized cerebral
volume loss. The midline structures are normal.

Vascular: Major flow voids are preserved.

Skull and upper cervical spine: Normal calvarium and skull base.
Visualized upper cervical spine and soft tissues are normal.

Sinuses/Orbits:No paranasal sinus fluid levels or advanced mucosal
thickening. No mastoid or middle ear effusion. Normal orbits.
IMPRESSION: 1. No acute intracranial abnormality.
2. Findings of chronic microvascular ischemia and cerebral volume
loss.

## 2023-08-28 IMAGING — CR DG CHEST 1V PORT
1 series · 1 of 1 positions shown · non-contrast
Comparison: 12/18/2021.

CLINICAL DATA: Altered mental status, questionable sepsis, slurred
speech, fall.

EXAM:
PORTABLE CHEST 1 VIEW

[chest ap]
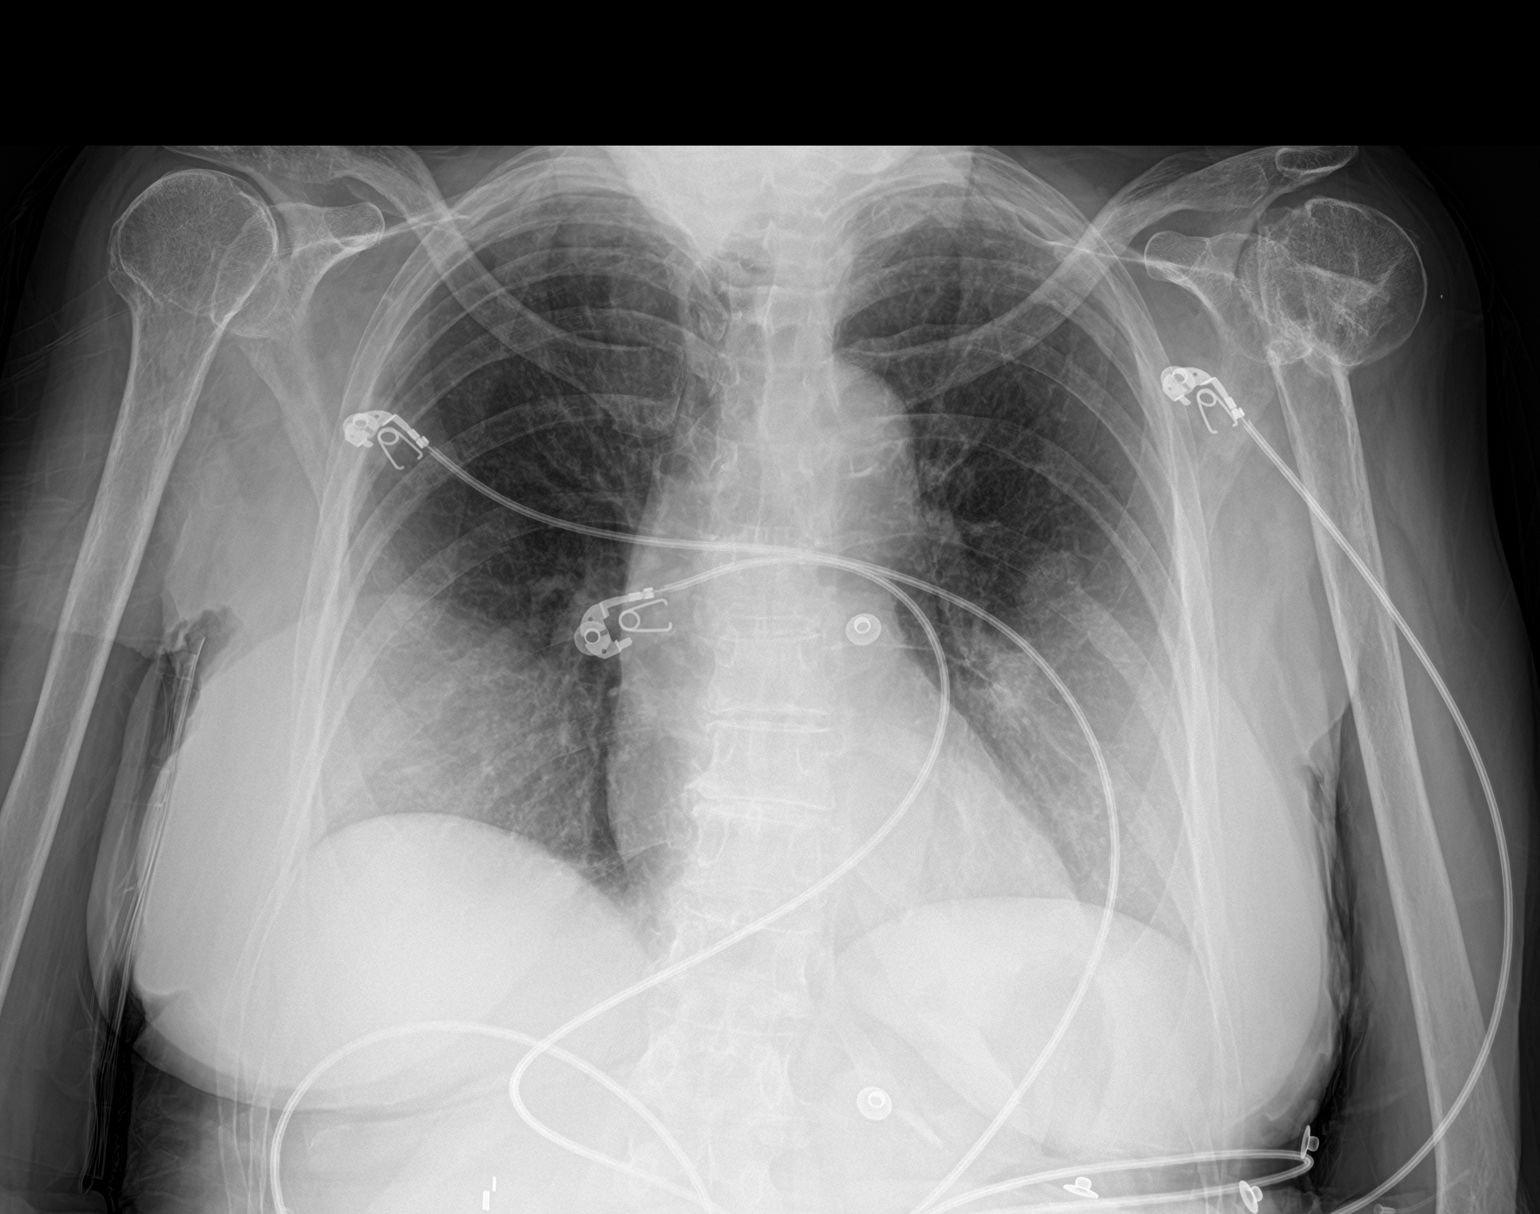

[1 of 1 positions shown; findings below may reference images not displayed]

FINDINGS: Trachea is midline. Heart size normal. Biapical pleuroparenchymal
scarring. Lungs are clear. No pleural fluid.

Healed fractures of the posterior left seventh and eighth ribs.
Healed left humeral neck fracture.
IMPRESSION: No acute findings.

## 2023-08-30 ENCOUNTER — Ambulatory Visit (INDEPENDENT_AMBULATORY_CARE_PROVIDER_SITE_OTHER): Payer: PPO | Admitting: Nurse Practitioner

## 2023-08-30 ENCOUNTER — Encounter (INDEPENDENT_AMBULATORY_CARE_PROVIDER_SITE_OTHER): Payer: Self-pay

## 2023-08-30 DIAGNOSIS — G629 Polyneuropathy, unspecified: Secondary | ICD-10-CM | POA: Insufficient documentation

## 2023-08-30 DIAGNOSIS — I201 Angina pectoris with documented spasm: Secondary | ICD-10-CM | POA: Insufficient documentation

## 2023-09-05 ENCOUNTER — Telehealth: Payer: Self-pay | Admitting: Internal Medicine

## 2023-09-05 NOTE — Telephone Encounter (Signed)
Pts niece Malen Gauze is calling regarding letter that the patient received regarding her care being compromised and recommending her to be seen at another clinic. Please advise [phone]- [phone]

## 2023-09-05 NOTE — Telephone Encounter (Signed)
Caregiver notified, patient unhappy with care and leaving bad reviews

## 2023-09-26 ENCOUNTER — Inpatient Hospital Stay
Admission: EM | Admit: 2023-09-26 | Discharge: 2023-09-30 | DRG: 308 | Disposition: A | Discharge status: Skilled Nursing Facility | Payer: PPO | Attending: Internal Medicine | Admitting: Internal Medicine

## 2023-09-26 ENCOUNTER — Emergency Department: Payer: PPO

## 2023-09-26 ENCOUNTER — Other Ambulatory Visit: Payer: Self-pay

## 2023-09-26 DIAGNOSIS — Z79899 Other long term (current) drug therapy: Secondary | ICD-10-CM

## 2023-09-26 DIAGNOSIS — R748 Abnormal levels of other serum enzymes: Secondary | ICD-10-CM | POA: Diagnosis present

## 2023-09-26 DIAGNOSIS — G8190 Hemiplegia, unspecified affecting unspecified side: Secondary | ICD-10-CM

## 2023-09-26 DIAGNOSIS — Z833 Family history of diabetes mellitus: Secondary | ICD-10-CM | POA: Diagnosis not present

## 2023-09-26 DIAGNOSIS — Z803 Family history of malignant neoplasm of breast: Secondary | ICD-10-CM

## 2023-09-26 DIAGNOSIS — Z90710 Acquired absence of both cervix and uterus: Secondary | ICD-10-CM

## 2023-09-26 DIAGNOSIS — L89312 Pressure ulcer of right buttock, stage 2: Secondary | ICD-10-CM | POA: Diagnosis present

## 2023-09-26 DIAGNOSIS — F411 Generalized anxiety disorder: Secondary | ICD-10-CM | POA: Diagnosis present

## 2023-09-26 DIAGNOSIS — M797 Fibromyalgia: Secondary | ICD-10-CM | POA: Diagnosis present

## 2023-09-26 DIAGNOSIS — S0240CA Maxillary fracture, right side, initial encounter for closed fracture: Secondary | ICD-10-CM | POA: Diagnosis present

## 2023-09-26 DIAGNOSIS — I482 Chronic atrial fibrillation, unspecified: Secondary | ICD-10-CM | POA: Diagnosis present

## 2023-09-26 DIAGNOSIS — N1831 Chronic kidney disease, stage 3a: Secondary | ICD-10-CM | POA: Diagnosis present

## 2023-09-26 DIAGNOSIS — I4891 Unspecified atrial fibrillation: Secondary | ICD-10-CM | POA: Diagnosis not present

## 2023-09-26 DIAGNOSIS — I130 Hypertensive heart and chronic kidney disease with heart failure and stage 1 through stage 4 chronic kidney disease, or unspecified chronic kidney disease: Secondary | ICD-10-CM | POA: Diagnosis present

## 2023-09-26 DIAGNOSIS — K219 Gastro-esophageal reflux disease without esophagitis: Secondary | ICD-10-CM | POA: Diagnosis present

## 2023-09-26 DIAGNOSIS — E871 Hypo-osmolality and hyponatremia: Secondary | ICD-10-CM | POA: Diagnosis present

## 2023-09-26 DIAGNOSIS — Z87891 Personal history of nicotine dependence: Secondary | ICD-10-CM

## 2023-09-26 DIAGNOSIS — R55 Syncope and collapse: Secondary | ICD-10-CM | POA: Diagnosis present

## 2023-09-26 DIAGNOSIS — G8191 Hemiplegia, unspecified affecting right dominant side: Secondary | ICD-10-CM | POA: Diagnosis present

## 2023-09-26 DIAGNOSIS — R29810 Facial weakness: Secondary | ICD-10-CM | POA: Diagnosis present

## 2023-09-26 DIAGNOSIS — J449 Chronic obstructive pulmonary disease, unspecified: Secondary | ICD-10-CM | POA: Diagnosis present

## 2023-09-26 DIAGNOSIS — M329 Systemic lupus erythematosus, unspecified: Secondary | ICD-10-CM | POA: Diagnosis present

## 2023-09-26 DIAGNOSIS — Z79891 Long term (current) use of opiate analgesic: Secondary | ICD-10-CM

## 2023-09-26 DIAGNOSIS — W19XXXA Unspecified fall, initial encounter: Secondary | ICD-10-CM | POA: Diagnosis not present

## 2023-09-26 DIAGNOSIS — Z9049 Acquired absence of other specified parts of digestive tract: Secondary | ICD-10-CM

## 2023-09-26 DIAGNOSIS — G894 Chronic pain syndrome: Secondary | ICD-10-CM | POA: Diagnosis not present

## 2023-09-26 DIAGNOSIS — N179 Acute kidney failure, unspecified: Secondary | ICD-10-CM | POA: Diagnosis present

## 2023-09-26 DIAGNOSIS — Z853 Personal history of malignant neoplasm of breast: Secondary | ICD-10-CM

## 2023-09-26 DIAGNOSIS — I639 Cerebral infarction, unspecified: Secondary | ICD-10-CM | POA: Diagnosis present

## 2023-09-26 DIAGNOSIS — S02401A Maxillary fracture, unspecified, initial encounter for closed fracture: Secondary | ICD-10-CM

## 2023-09-26 DIAGNOSIS — K5900 Constipation, unspecified: Secondary | ICD-10-CM | POA: Diagnosis present

## 2023-09-26 DIAGNOSIS — Y92008 Other place in unspecified non-institutional (private) residence as the place of occurrence of the external cause: Secondary | ICD-10-CM | POA: Diagnosis not present

## 2023-09-26 DIAGNOSIS — Z66 Do not resuscitate: Secondary | ICD-10-CM | POA: Diagnosis present

## 2023-09-26 DIAGNOSIS — R296 Repeated falls: Secondary | ICD-10-CM

## 2023-09-26 DIAGNOSIS — R54 Age-related physical debility: Secondary | ICD-10-CM | POA: Insufficient documentation

## 2023-09-26 DIAGNOSIS — E43 Unspecified severe protein-calorie malnutrition: Secondary | ICD-10-CM | POA: Diagnosis present

## 2023-09-26 DIAGNOSIS — W1830XA Fall on same level, unspecified, initial encounter: Secondary | ICD-10-CM | POA: Diagnosis present

## 2023-09-26 DIAGNOSIS — F0394 Unspecified dementia, unspecified severity, with anxiety: Secondary | ICD-10-CM | POA: Diagnosis present

## 2023-09-26 DIAGNOSIS — Z9013 Acquired absence of bilateral breasts and nipples: Secondary | ICD-10-CM

## 2023-09-26 DIAGNOSIS — I5032 Chronic diastolic (congestive) heart failure: Secondary | ICD-10-CM | POA: Diagnosis present

## 2023-09-26 DIAGNOSIS — Y92009 Unspecified place in unspecified non-institutional (private) residence as the place of occurrence of the external cause: Secondary | ICD-10-CM | POA: Diagnosis not present

## 2023-09-26 DIAGNOSIS — R531 Weakness: Secondary | ICD-10-CM

## 2023-09-26 DIAGNOSIS — Z8673 Personal history of transient ischemic attack (TIA), and cerebral infarction without residual deficits: Secondary | ICD-10-CM

## 2023-09-26 DIAGNOSIS — Z7901 Long term (current) use of anticoagulants: Secondary | ICD-10-CM | POA: Diagnosis not present

## 2023-09-26 DIAGNOSIS — I6782 Cerebral ischemia: Secondary | ICD-10-CM | POA: Diagnosis not present

## 2023-09-26 DIAGNOSIS — F0390 Unspecified dementia without behavioral disturbance: Secondary | ICD-10-CM

## 2023-09-26 DIAGNOSIS — L8990 Pressure ulcer of unspecified site, unspecified stage: Secondary | ICD-10-CM | POA: Insufficient documentation

## 2023-09-26 DIAGNOSIS — N183 Chronic kidney disease, stage 3 unspecified: Secondary | ICD-10-CM | POA: Diagnosis present

## 2023-09-26 LAB — URINALYSIS, ROUTINE W REFLEX MICROSCOPIC
Bilirubin Urine: NEGATIVE
Glucose, UA: NEGATIVE mg/dL
Hgb urine dipstick: NEGATIVE
Ketones, ur: 5 mg/dL — AB
Leukocytes,Ua: NEGATIVE
Nitrite: POSITIVE — AB
Protein, ur: NEGATIVE mg/dL
Specific Gravity, Urine: 1.011 (ref 1.005–1.030)
Squamous Epithelial / HPF: 0 /[HPF] (ref 0–5)
pH: 6 (ref 5.0–8.0)

## 2023-09-26 LAB — COMPREHENSIVE METABOLIC PANEL
ALT: 20 U/L (ref 0–44)
AST: 36 U/L (ref 15–41)
Albumin: 3.9 g/dL (ref 3.5–5.0)
Alkaline Phosphatase: 387 U/L — ABNORMAL HIGH (ref 38–126)
Anion gap: 11 (ref 5–15)
BUN: 27 mg/dL — ABNORMAL HIGH (ref 8–23)
CO2: 25 mmol/L (ref 22–32)
Calcium: 9.2 mg/dL (ref 8.9–10.3)
Chloride: 97 mmol/L — ABNORMAL LOW (ref 98–111)
Creatinine, Ser: 1.16 mg/dL — ABNORMAL HIGH (ref 0.44–1.00)
GFR, Estimated: 49 mL/min — ABNORMAL LOW (ref >60)
Glucose, Bld: 130 mg/dL — ABNORMAL HIGH (ref 70–99)
Potassium: 4.3 mmol/L (ref 3.5–5.1)
Sodium: 133 mmol/L — ABNORMAL LOW (ref 135–145)
Total Bilirubin: 0.6 mg/dL (ref 0.3–1.2)
Total Protein: 8.4 g/dL — ABNORMAL HIGH (ref 6.5–8.1)

## 2023-09-26 LAB — CBC WITH DIFFERENTIAL/PLATELET
Abs Immature Granulocytes: 0.05 10*3/uL (ref 0.00–0.07)
Basophils Absolute: 0 10*3/uL (ref 0.0–0.1)
Basophils Relative: 0 %
Eosinophils Absolute: 0 10*3/uL (ref 0.0–0.5)
Eosinophils Relative: 0 %
HCT: 43.3 % (ref 36.0–46.0)
Hemoglobin: 14.3 g/dL (ref 12.0–15.0)
Immature Granulocytes: 0 %
Lymphocytes Relative: 5 %
Lymphs Abs: 0.6 10*3/uL — ABNORMAL LOW (ref 0.7–4.0)
MCH: 30.6 pg (ref 26.0–34.0)
MCHC: 33 g/dL (ref 30.0–36.0)
MCV: 92.5 fL (ref 80.0–100.0)
Monocytes Absolute: 0.7 10*3/uL (ref 0.1–1.0)
Monocytes Relative: 5 %
Neutro Abs: 12.1 10*3/uL — ABNORMAL HIGH (ref 1.7–7.7)
Neutrophils Relative %: 90 %
Platelets: 293 10*3/uL (ref 150–400)
RBC: 4.68 MIL/uL (ref 3.87–5.11)
RDW: 13.2 % (ref 11.5–15.5)
WBC: 13.6 10*3/uL — ABNORMAL HIGH (ref 4.0–10.5)
nRBC: 0 % (ref 0.0–0.2)

## 2023-09-26 LAB — TROPONIN I (HIGH SENSITIVITY): Troponin I (High Sensitivity): 11 ng/L (ref <18)

## 2023-09-26 LAB — CK: Total CK: 153 U/L (ref 38–234)

## 2023-09-26 MED ORDER — TRAZODONE HCL 50 MG PO TABS
150.0000 mg | ORAL_TABLET | Freq: Every day | ORAL | Status: DC
  Administered 2023-09-27 – 2023-09-29 (×4): 150 mg via ORAL
  Filled 2023-09-26 (×4): qty 1

## 2023-09-26 MED ORDER — OXYCODONE HCL 5 MG PO TABS
5.0000 mg | ORAL_TABLET | ORAL | Status: DC | PRN
  Administered 2023-09-27 – 2023-09-28 (×3): 10 mg via ORAL
  Administered 2023-09-28 – 2023-09-29 (×3): 5 mg via ORAL
  Administered 2023-09-29: 10 mg via ORAL
  Administered 2023-09-29: 5 mg via ORAL
  Administered 2023-09-30: 10 mg via ORAL
  Filled 2023-09-26 (×5): qty 2
  Filled 2023-09-26 (×3): qty 1
  Filled 2023-09-26 (×2): qty 2

## 2023-09-26 MED ORDER — METOPROLOL TARTRATE 25 MG PO TABS
12.5000 mg | ORAL_TABLET | Freq: Once | ORAL | Status: AC
  Administered 2023-09-26: 12.5 mg via ORAL
  Filled 2023-09-26: qty 1

## 2023-09-26 MED ORDER — DILTIAZEM HCL 25 MG/5ML IV SOLN
10.0000 mg | Freq: Once | INTRAVENOUS | Status: AC
  Administered 2023-09-26: 10 mg via INTRAVENOUS
  Filled 2023-09-26: qty 5

## 2023-09-26 MED ORDER — ONDANSETRON HCL 4 MG PO TABS: 4.0000 mg | ORAL_TABLET | Freq: Four times a day (QID) | ORAL | Status: DC | PRN

## 2023-09-26 MED ORDER — DILTIAZEM HCL-DEXTROSE 125-5 MG/125ML-% IV SOLN (PREMIX)
5.0000 mg/h | INTRAVENOUS | Status: DC
  Administered 2023-09-26: 5 mg/h via INTRAVENOUS
  Filled 2023-09-26: qty 125

## 2023-09-26 MED ORDER — DILTIAZEM LOAD VIA INFUSION
15.0000 mg | Freq: Once | INTRAVENOUS | Status: AC
  Administered 2023-09-26: 15 mg via INTRAVENOUS
  Filled 2023-09-26: qty 15

## 2023-09-26 MED ORDER — AMIODARONE HCL 200 MG PO TABS
100.0000 mg | ORAL_TABLET | Freq: Every day | ORAL | Status: DC
  Administered 2023-09-27 – 2023-09-30 (×4): 100 mg via ORAL
  Filled 2023-09-26 (×4): qty 1

## 2023-09-26 MED ORDER — ENOXAPARIN SODIUM 40 MG/0.4ML IJ SOSY
40.0000 mg | PREFILLED_SYRINGE | INTRAMUSCULAR | Status: DC
  Administered 2023-09-27 – 2023-09-30 (×4): 40 mg via SUBCUTANEOUS
  Filled 2023-09-26 (×4): qty 0.4

## 2023-09-26 MED ORDER — ACETAMINOPHEN 325 MG PO TABS: 650.0000 mg | ORAL_TABLET | Freq: Four times a day (QID) | ORAL | Status: DC | PRN

## 2023-09-26 MED ORDER — ACETAMINOPHEN 650 MG RE SUPP: 650.0000 mg | Freq: Four times a day (QID) | RECTAL | Status: DC | PRN

## 2023-09-26 MED ORDER — AMIODARONE HCL 200 MG PO TABS
100.0000 mg | ORAL_TABLET | Freq: Once | ORAL | Status: AC
  Administered 2023-09-26: 100 mg via ORAL
  Filled 2023-09-26: qty 1

## 2023-09-26 MED ORDER — ONDANSETRON HCL 4 MG/2ML IJ SOLN
4.0000 mg | Freq: Four times a day (QID) | INTRAMUSCULAR | Status: DC | PRN
  Administered 2023-09-27: 4 mg via INTRAVENOUS
  Filled 2023-09-26 (×3): qty 2

## 2023-09-26 NOTE — Assessment & Plan Note (Signed)
History of elevated alkaline phosphatase dating back to 2014  No further workup.  Patient has been previously worked up.  RUQ ultrasound and December showed fatty liver

## 2023-09-26 NOTE — ED Notes (Signed)
X-ray at bedside

## 2023-09-26 NOTE — Assessment & Plan Note (Addendum)
Pain control, ice packs Can consider ENT inpatient consult versus outpatient referral Fall precautions

## 2023-09-26 NOTE — Assessment & Plan Note (Signed)
Dietary consult

## 2023-09-26 NOTE — Assessment & Plan Note (Deleted)
No acute issues.

## 2023-09-26 NOTE — ED Notes (Signed)
[facility]) at bedside.

## 2023-09-26 NOTE — H&P (Signed)
History and Physical    Patient: Donna Evans:[id number] DOB: December 25, 1945 DOA: 09/26/2023 DOS: the patient was seen and examined on 09/26/2023 PCP: Margarita Mail, DO  Patient coming from: Home  Chief Complaint:  Chief Complaint  Patient presents with   Fall    HPI: Donna Evans is a 77 y.o. female with medical history significant for hypertension, CVA, depression with anxiety, migraine headache, right breast cancer s/p mastectomy, chronic pain syndrome, HFpEF, CKD stage IIIa, COPD, SLE, A-fib on amiodarone, no longer on Eliquis , history of falls, syncope and balance issues, dementia with prior in-hospital delirium , last hospitalized in April 2024 with a subdural hematoma and subarachnoid hemorrhage, recurrent syncope with negative EEG in April, who presents to the ED with a fall. Patient remained was getting to the door sometime the night prior but then fell.  She lost consciousness.  When she came around she was unable to get up.  Friends called earlier in the day and did not get her and when the checked on her at 3 PM they found her on the floor. ED Course and data review: Tachycardic to up to 147 with SBP in the 170s and otherwise normal vitals. Labs: CK normal at 153, troponin 11 WBC 13,000, normal hemoglobin of 14 CMP notable for mild hyponatremia of 133, creatinine 1.16 up from baseline of 0.97.  Chronically elevated alk phos of 387 down from 537 about 6 months prior. UA with positive nitrite and rare bacteria. EKG, personally viewed and interpreted showing wide-complex tachycardia-looks like A-fib with aberrancy due to RBBB Trauma imaging reveals --- Patient started on diltiazem bolus and infusion and given her nighttime metoprolol and amiodarone Hospitalist consulted for admission.   Review of Systems: As mentioned in the history of present illness. All other systems reviewed and are negative.  Past Medical History:  Diagnosis Date   Allergic state    Anginal pain  (HCC)    Prinzmetal's angina   Anxiety    Arthritis    osteoarthritis   Breast cancer (HCC) 1990   right breast cancer   Chronic pain    Chronic pain    COPD (chronic obstructive pulmonary disease) (HCC)    Depression    Edema    Fibromyalgia    Foot fracture    Bilateral   GERD (gastroesophageal reflux disease)    History of kidney stones    Hypertension    Leg fracture, right    Low back pain    Lumbosacral neuritis    Medulloadrenal hyperfunction (HCC)    Migraine headache    Peripheral neuropathy    Shoulder fracture, right    Stroke [facility])    TIA   Systemic lupus erythematosus (HCC)    Thyroid disease    TIA (transient ischemic attack) 11/28/2013   Transient global amnesia 2011   Past Surgical History:  Procedure Laterality Date   ABDOMINAL HYSTERECTOMY  1981   APPENDECTOMY  1957   AUGMENTATION MAMMAPLASTY Bilateral 1990   saline submuscular   BACK SURGERY  1989   BILATERAL TOTAL MASTECTOMY WITH AXILLARY LYMPH NODE DISSECTION  1990   BREAST IMPLANT EXCHANGE Bilateral 05/11/2016   Procedure: REMOVAL AND REPLACEMENT OF BREAST IMPLANTS ;  Surgeon: Glenna Fellows, MD;  Location: [facility];  Service: Plastics;  Laterality: Bilateral;   CAPSULECTOMY Bilateral 05/11/2016   Procedure: CAPSULECTOMY CAPSULORRAPHY ;  Surgeon: Glenna Fellows, MD;  Location: [facility];  Service: Plastics;  Laterality: Bilateral;   CATARACT EXTRACTION  W/PHACO Right 12/26/2018   Procedure: CATARACT EXTRACTION PHACO AND INTRAOCULAR LENS PLACEMENT (IOC) RIGHT;  Surgeon: Galen Manila, MD;  Location: [facility];  Service: Ophthalmology;  Laterality: Right;  [place]  00:46 CDE 8.50 Fluid pack lot # [id number]   CATARACT EXTRACTION W/PHACO Left 01/23/2019   Procedure: CATARACT EXTRACTION PHACO AND INTRAOCULAR LENS PLACEMENT (IOC) LEFT;  Surgeon: Galen Manila, MD;  Location: [facility];  Service: Ophthalmology;  Laterality: Left;  [place]  00:45 CDE  8.41 fluid pack lot #  [id number]   CHOLECYSTECTOMY  1979   COLONOSCOPY WITH PROPOFOL N/A 01/25/2018   Procedure: COLONOSCOPY WITH PROPOFOL;  Surgeon: Scot Jun, MD;  Location: [facility];  Service: Endoscopy;  Laterality: N/A;   COPD     ESOPHAGOGASTRODUODENOSCOPY (EGD) WITH PROPOFOL N/A 03/07/2018   Procedure: ESOPHAGOGASTRODUODENOSCOPY (EGD) WITH PROPOFOL;  Surgeon: Scot Jun, MD;  Location: [facility] END[facility]COPY;  Service: Endoscopy;  Laterality: N/A;   FRACTURE SURGERY     HARDWARE REMOVAL Left 06/18/2020   Procedure: HARDWARE REMOVAL;  Surgeon: Lyndle Herrlich, MD;  Location: [facility];  Service: Orthopedics;  Laterality: Left;   HUMERUS IM NAIL Left 12/31/2019   Procedure: INTRAMEDULLARY (IM) NAIL HUMERAL;  Surgeon: Lyndle Herrlich, MD;  Location: [facility];  Service: Orthopedics;  Laterality: Left;   LIPOSUCTION Right 05/11/2016   Procedure: LIPOSUCTION;  Surgeon: Glenna Fellows, MD;  Location: [facility];  Service: Plastics;  Laterality: Right;   LIVER BIOPSY  2011   MASTECTOMY SUBCUTANEOUS Bilateral 1990   MASTOPEXY Bilateral 05/11/2016   Procedure: MASTOPEXY BILATERAL ;  Surgeon: Glenna Fellows, MD;  Location: [facility];  Service: Plastics;  Laterality: Bilateral;   Social History:  reports that she quit smoking about 18 years ago. Her smoking use included cigarettes. She started smoking about 58 years ago. She has a 40 pack-year smoking history. She has never used smokeless tobacco. She reports that she does not currently use alcohol. She reports that she does not use drugs.  Allergies  Allergen Reactions   Ciprofloxacin Nausea And Vomiting and Hives   Codeine Anaphylaxis, Hives and Other (See Comments)    Reaction:  Unknown   Fluconazole Hives   Latex Anaphylaxis and Rash   Meperidine Anaphylaxis and Hives    Reaction:  Unknown  Reaction:  Unknown   Unknown   Morphine And Codeine Hives, Anaphylaxis and Other (See Comments)    Reaction:  Unknown    unknown  Other reaction(s): Other (See Comments)  unknown   Doxycycline Nausea And Vomiting   Flagyl [Metronidazole] Nausea And Vomiting    Reaction:  Unknown    Influenza Vaccines Swelling    Localized swelling   Tape Other (See Comments)    Pt allergic to Adhesive tape and latex.(  Skin breaks out)   Valtrex [Valacyclovir Hcl] Hives   Buprenorphine     Other reaction(s): Other (see comments) unknown   Duloxetine Other (See Comments)    Other Reaction(s): GI intolerance  Reaction:  Unknown     Reaction:  Unknown     Reaction:  Unknown   Silicone Other (See Comments)    Pt allergic to Adhesive tape and latex.(  Skin breaks out)   Tramadol     Other reaction(s): Not available   Amoxicillin-Pot Clavulanate Nausea And Vomiting   Sulfamethoxazole-Trimethoprim Nausea And Vomiting and Rash   Valacyclovir Rash    Family History  Problem Relation Age of Onset   Atrial fibrillation Mother    Atrial fibrillation  Sister    Cancer Sister    Diabetes Sister    Breast cancer Sister 86   Diabetes Father    Cancer Sister    Diabetes Sister    Atrial fibrillation Sister    Kidney disease Maternal Aunt     Prior to Admission medications   Medication Sig Start Date End Date Taking? Authorizing Provider  amiodarone (PACERONE) 100 MG tablet Take 1 tablet (100 mg total) by mouth daily. 03/30/23   Alford Highland, MD  apixaban (ELIQUIS) 5 MG TABS tablet Take 5 mg by mouth 2 (two) times daily. 05/02/23   [provider]  ascorbic acid (VITAMIN C) 500 MG tablet Take 1 tablet (500 mg total) by mouth daily. 04/25/23   Margarita Mail, DO  cetirizine (ZYRTEC) 10 MG tablet Take 1 tablet (10 mg total) by mouth daily. 05/24/23   Margarita Mail, DO  cholecalciferol (VITAMIN D3) 25 MCG (1000 UNIT) tablet Take 1,000 Units by mouth daily.    [provider]  cyclobenzaprine (FLEXERIL) 5 MG tablet Take 1 tablet (5 mg total) by mouth 3 (three) times daily as needed for muscle  spasms. 07/06/23   Margarita Mail, DO  feeding supplement (ENSURE ENLIVE / ENSURE PLUS) LIQD Take 237 mLs by mouth 2 (two) times daily between meals. 02/03/23   Tresa Moore, MD  folic acid (FOLVITE) 1 MG tablet TAKE ONE TABLET BY MOUTH EVERY DAY 06/24/23   Margarita Mail, DO  gabapentin (NEURONTIN) 100 MG capsule Take 3 capsules (300 mg total) by mouth at bedtime. 05/24/23   Margarita Mail, DO  lidocaine (LIDODERM) 5 % Place 1 patch onto the skin daily. Remove & Discard patch within 12 hours or as directed by MD. Apply to back. 11/19/22   Narda Bonds, MD  metoprolol tartrate (LOPRESSOR) 25 MG tablet Take 0.5 tablets (12.5 mg total) by mouth 2 (two) times daily. 03/29/23   Alford Highland, MD  Multiple Vitamin (MULTIVITAMIN WITH MINERALS) TABS tablet Take 1 tablet by mouth daily. 04/25/23   Margarita Mail, DO  traZODone (DESYREL) 100 MG tablet Take 1 tablet (100 mg total) by mouth at bedtime. 07/13/23   Margarita Mail, DO    Physical Exam: Vitals:   09/26/23 1915 09/26/23 1930 09/26/23 1945 09/26/23 2000  BP: (!) 149/95 (!) 162/84 (!) 150/105 (!) 151/91  Pulse: (!) 48 (!) 39 (!) 132 76  Resp: (!) 21 (!) 37 (!) 36 (!) 26  Temp:      TempSrc:      SpO2: 97% 95% 96% 98%  Weight:      Height:       Physical Exam Vitals and nursing note reviewed.  Constitutional:      General: She is sleeping. She is not in acute distress. HENT:     Head: Normocephalic and atraumatic.  Cardiovascular:     Rate and Rhythm: Normal rate and regular rhythm.     Heart sounds: Normal heart sounds.  Pulmonary:     Effort: Pulmonary effort is normal.     Breath sounds: Normal breath sounds.  Abdominal:     Palpations: Abdomen is soft.     Tenderness: There is no abdominal tenderness.  Neurological:     Mental Status: She is easily aroused. Mental status is at baseline.     Labs on Admission: I have personally reviewed following labs and imaging studies  CBC: Recent Labs  Lab  09/26/23 1620  WBC 13.6*  NEUTROABS 12.1*  HGB 14.3  HCT 43.3  MCV 92.5  PLT 293   Basic Metabolic Panel: Recent Labs  Lab 09/26/23 1620  NA 133*  K 4.3  CL 97*  CO2 25  GLUCOSE 130*  BUN 27*  CREATININE 1.16*  CALCIUM 9.2   GFR: Estimated Creatinine Clearance: 34.6 mL/min (A) (by C-G formula based on SCr of 1.16 mg/dL (H)). Liver Function Tests: Recent Labs  Lab 09/26/23 1620  AST 36  ALT 20  ALKPHOS 387*  BILITOT 0.6  PROT 8.4*  ALBUMIN 3.9   No results for input(s): "LIPASE", "AMYLASE" in the last 168 hours. No results for input(s): "AMMONIA" in the last 168 hours. Coagulation Profile: No results for input(s): "INR", "PROTIME" in the last 168 hours. Cardiac Enzymes: Recent Labs  Lab 09/26/23 1620  CKTOTAL 153   BNP (last 3 results) No results for input(s): "PROBNP" in the last 8760 hours. HbA1C: No results for input(s): "HGBA1C" in the last 72 hours. CBG: No results for input(s): "GLUCAP" in the last 168 hours. Lipid Profile: No results for input(s): "CHOL", "HDL", "LDLCALC", "TRIG", "CHOLHDL", "LDLDIRECT" in the last 72 hours. Thyroid Function Tests: No results for input(s): "TSH", "T4TOTAL", "FREET4", "T3FREE", "THYROIDAB" in the last 72 hours. Anemia Panel: No results for input(s): "VITAMINB12", "FOLATE", "FERRITIN", "TIBC", "IRON", "RETICCTPCT" in the last 72 hours. Urine analysis:    Component Value Date/Time   COLORURINE YELLOW (A) 09/26/2023 1758   APPEARANCEUR CLEAR (A) 09/26/2023 1758   LABSPEC 1.011 09/26/2023 1758   PHURINE 6.0 09/26/2023 1758   GLUCOSEU NEGATIVE 09/26/2023 1758   HGBUR NEGATIVE 09/26/2023 1758   BILIRUBINUR NEGATIVE 09/26/2023 1758   BILIRUBINUR Negative 07/13/2023 1140   KETONESUR 5 (A) 09/26/2023 1758   PROTEINUR NEGATIVE 09/26/2023 1758   UROBILINOGEN 0.2 07/13/2023 1140   UROBILINOGEN 0.2 11/20/2013 0201   NITRITE POSITIVE (A) 09/26/2023 1758   LEUKOCYTESUR NEGATIVE 09/26/2023 1758    Radiological Exams on  Admission: DG Pelvis 1-2 Views  Result Date: 09/26/2023 CLINICAL DATA:  Fall EXAM: PELVIS - 1-2 VIEW COMPARISON:  None Available. FINDINGS: There is no evidence of pelvic fracture or diastasis. No pelvic bone lesions are seen. IMPRESSION: Negative. Electronically Signed   By: Charlett Nose M.D.   On: 09/26/2023 19:30   DG Shoulder Right  Result Date: 09/26/2023 CLINICAL DATA:  Fall, right arm pain EXAM: RIGHT SHOULDER - 2+ VIEW COMPARISON:  None Available. FINDINGS: No acute bony abnormality. Specifically, no fracture, subluxation, or dislocation. Old healed posterior right 7th and 8th rib fractures. No visible acute rib fracture. IMPRESSION: No acute bony abnormality. Electronically Signed   By: Charlett Nose M.D.   On: 09/26/2023 19:29   DG Chest Portable 1 View  Result Date: 09/26/2023 CLINICAL DATA:  Fall, left hip pain, right arm pain EXAM: PORTABLE CHEST 1 VIEW COMPARISON:  03/23/2023 FINDINGS: Heart and mediastinal contours are within normal limits. No focal opacities or effusions. No acute bony abnormality. Aortic atherosclerosis. IMPRESSION: No active cardiopulmonary disease. Electronically Signed   By: Charlett Nose M.D.   On: 09/26/2023 19:28   CT Cervical Spine Wo Contrast  Result Date: 09/26/2023 CLINICAL DATA:  Neck trauma.  Found unresponsive face down. EXAM: CT CERVICAL SPINE WITHOUT CONTRAST TECHNIQUE: Multidetector CT imaging of the cervical spine was performed without intravenous contrast. Multiplanar CT image reconstructions were also generated. RADIATION DOSE REDUCTION: This exam was performed according to the departmental dose-optimization program which includes automated exposure control, adjustment of the mA and/or kV according to patient size and/or use of iterative reconstruction technique. COMPARISON:  None Available. FINDINGS: Alignment: Normal. Skull base and vertebrae: No acute fracture. No primary bone lesion or focal pathologic process. Soft tissues and spinal canal:  No prevertebral fluid or swelling. No visible canal hematoma. Disc levels: Disc space narrowing and spurring. Mild bilateral degenerative facet disease. Upper chest: Biapical scarring.  Paraseptal emphysema. Other: None IMPRESSION: Degenerative disc and facet disease. No acute bony abnormality. Electronically Signed   By: Charlett Nose M.D.   On: 09/26/2023 19:27   CT Maxillofacial Wo Contrast  Result Date: 09/26/2023 CLINICAL DATA:  Found face down.  Facial trauma, blunt EXAM: CT MAXILLOFACIAL WITHOUT CONTRAST TECHNIQUE: Multidetector CT imaging of the maxillofacial structures was performed. Multiplanar CT image reconstructions were also generated. RADIATION DOSE REDUCTION: This exam was performed according to the departmental dose-optimization program which includes automated exposure control, adjustment of the mA and/or kV according to patient size and/or use of iterative reconstruction technique. COMPARISON:  None Available. FINDINGS: Osseous: There is a linear lucency through the anterior wall of the right maxillary sinus with frothy material layering in the right maxillary sinus. Findings concerning for nondisplaced anterior right maxillary wall fracture. No additional fracture. Zygomatic arches and mandible intact. Orbits: No evidence of orbital fracture.  Globes intact. Sinuses: Mucosal thickening in the right maxillary sinus with layering frothy material in the right maxillary sinus and sphenoid sinus. Soft tissues: Soft tissue swelling over the right face, orbit and forehead. Limited intracranial: See head CT report IMPRESSION: Linear lucency in the anterior wall the right maxillary sinus with layering frothy material in the right maxillary and sphenoid sinuses. Findings concerning for nondisplaced fracture. No orbital fracture.  In Electronically Signed   By: Charlett Nose M.D.   On: 09/26/2023 19:26   CT HEAD WO CONTRAST ( )  Result Date: 09/26/2023 CLINICAL DATA:  Head trauma, moderate-severe.   Found face down EXAM: CT HEAD WITHOUT CONTRAST TECHNIQUE: Contiguous axial images were obtained from the base of the skull through the vertex without intravenous contrast. RADIATION DOSE REDUCTION: This exam was performed according to the departmental dose-optimization program which includes automated exposure control, adjustment of the mA and/or kV according to patient size and/or use of iterative reconstruction technique. COMPARISON:  04/19/2023 FINDINGS: Brain: There is atrophy and chronic small vessel disease changes. No acute intracranial abnormality. Specifically, no hemorrhage, hydrocephalus, mass lesion, acute infarction, or significant intracranial injury. Vascular: No hyperdense vessel or unexpected calcification. Skull: No acute calvarial abnormality. Sinuses/Orbits: Soft tissue swelling over the right face and forehead. See further discussion on maxillofacial CT report Other: None IMPRESSION: Atrophy, chronic microvascular disease. No acute intracranial abnormality. Electronically Signed   By: Charlett Nose M.D.   On: 09/26/2023 19:23     Data Reviewed: Relevant notes from primary care and specialist visits, past discharge summaries as available in EHR, including Care Everywhere. Prior diagnostic testing as pertinent to current admission diagnoses Updated medications and problem lists for reconciliation ED course, including vitals, labs, imaging, treatment and response to treatment Triage notes, nursing and pharmacy notes and ED provider's notes Notable results as noted in HPI   Assessment and Plan: * Atrial fibrillation with rapid ventricular response (HCC) Continue diltiazem drip and wean to home meds Continue  amiodarone.  Patient apparently no longer on Eliquis  Fall at home with maxillary sinus fracture, initial encounter Frequent falls with injury (pelvic, radial, rib fracture, subarachnoid hemorrhage) Recurrent syncope, with prior history of orthostatic hypotension Frailty,  protein calorie malnutrition, physical deconditioning Consider safety of living alone Will not pursue full  syncopal workup due to frequent prior similar episodes full workup including EEGs Continuous cardiac monitoring Ice packs and pain control for current maxillary sinus fracture-can consider ENT outpatient referral PT and TOC consult-suspect patient will need placement  Maxillary sinus fracture, closed, initial encounter (HCC) Pain control, ice packs Can consider ENT inpatient consult versus outpatient referral Fall precautions  Dementia, with history of inpatient delirium/encephalopathy [facility]) Delirium precautions Has had neurologic workups and consultations with EEGs in the past for acute encephalopathy  AKI (acute kidney injury) (HCC) Mild elevation in creatinine from baseline Oral rehydration  Chronic diastolic CHF (congestive heart failure) (HCC) Clinically euvolemic Continue metoprolol Last EF 10/2022 55 to 60%.  Alkaline phosphatase elevation History of elevated alkaline phosphatase dating back to 2014  No further workup.  Patient has been previously worked up.  RUQ ultrasound and December showed fatty liver   Chronic pain syndrome Long-term opiate analgesic use Continue judicious use of oxycodone, gabapentin, Flexeril and trazodone Fall precautions  Protein-calorie malnutrition, severe (HCC) Dietary consult  Generalized anxiety disorder Continue trazodone  Breast cancer with Hx of mastectomy, bilateral with reconstruction No acute issues    DVT prophylaxis: eliquis  Consults: none  Advance Care Planning:   Code Status: Prior   Family Communication: Niece and POA wendy  Disposition Plan: Back to previous home environment  Severity of Illness: The appropriate patient status for this patient is INPATIENT. Inpatient status is judged to be reasonable and necessary in order to provide the required intensity of service to ensure the patient's safety. The  patient's presenting symptoms, physical exam findings, and initial radiographic and laboratory data in the context of their chronic comorbidities is felt to place them at high risk for further clinical deterioration. Furthermore, it is not anticipated that the patient will be medically stable for discharge from the hospital within 2 midnights of admission.   * I certify that at the point of admission it is my clinical judgment that the patient will require inpatient hospital care spanning beyond 2 midnights from the point of admission due to high intensity of service, high risk for further deterioration and high frequency of surveillance required.*  Author: Andris Baumann, MD 09/26/2023 8:14 PM  For on call review [email or web address].

## 2023-09-26 NOTE — Assessment & Plan Note (Deleted)
Syncope, with prior history of orthostatic hypotension Frailty, protein calorie malnutrition, physical deconditioning Consider risk benefit of continued anticoagulation  Consider safety of living alone Will not pursue full syncopal workup due to frequent prior similar episodes full workup including EEGs Continuous cardiac monitoring PT and TOC consult-suspect patient will need placement

## 2023-09-26 NOTE — IPAL (Signed)
  Interdisciplinary Goals of Care Family Meeting   Date carried out: 09/26/2023  Location of the meeting: Phone conference  Member's involved: Physician and Family Member or next of kin  Durable Power of Attorney or acting medical decision maker: Toniann Fail, niece, [place]    Discussion: We discussed goals of care for [employer] .   I have reviewed medical records including EPIC notes, labs and imaging, assessed the patient and then met with niece to discuss major active diagnoses, plan of care, natural trajectory, prognosis, GOC, EOL wishes, disposition and options including Full code/DNI/DNR and the concept of comfort care if DNR is elected. Questions and concerns were addressed. They are  in agreement to continue current plan of care . Election for DNR status.   Code status:   Code Status: Do not attempt resuscitation (DNR) PRE-ARREST INTERVENTIONS DESIRED   Disposition: Continue current acute care  Time spent for the meeting: 30    Andris Baumann, MD  09/26/2023, 10:47 PM

## 2023-09-26 NOTE — ED Provider Notes (Signed)
[facility] Provider Note    Event Date/Time   First MD Initiated Contact with Patient 09/26/23 1555     (approximate)   History   Fall   HPI  NA Donna Evans is a 77 y.o. female  with extensive PMHx CHF, CKD, on Hospice, here with fall. Pt lives at home. Last remembers walking to "get the door" sometime last night she thinks, when she fell. She remembers waking up on the ground but was unable to get up. She laid mostly on her R side. Friends called to check on her and found her on the ground. She has had falls similar to this in the past. She does not remember why she fell. Reports she feels weak, denies any other complaints.  Spoke with Selena Batten, with Hospice. Kim spoke with Jiles Prows who is POA. Has happened multiple times before. He is worried about her in the home alone.       Physical Exam   Triage Vital Signs: ED Triage Vitals  Encounter Vitals Group     BP 09/26/23 1610 (!) 170/70     Systolic BP Percentile --      Diastolic BP Percentile --      Pulse Rate 09/26/23 1601 100     Resp 09/26/23 1601 18     Temp 09/26/23 1601 98.2 F (36.8 C)     Temp Source 09/26/23 1601 Oral     SpO2 09/26/23 1601 100 %     Weight 09/26/23 1604 118 lb 14.4 oz (53.9 kg)     Height 09/26/23 1604 5\' 7"  (1.702 m)     Head Circumference --      Peak Flow --      Pain Score 09/26/23 1611 9     Pain Loc --      Pain Education --      Exclude from Growth Chart --     Most recent vital signs: Vitals:   09/26/23 2304 09/26/23 2313  BP: (!) 151/68   Pulse:    Resp: 20   Temp:  98.7 F (37.1 C)  SpO2: 95%      General: Awake, no distress.  CV:  Good peripheral perfusion. Tachycardic, irregularly irregular. Resp:  Normal work of breathing. Lungs clear. Abd:  No distention. No tenderness. No rebound or guarding. Other:  Dry MM. Oriented to person, place but not time.    ED Results / Procedures / Treatments   Labs (all labs ordered are listed, but  only abnormal results are displayed) Labs Reviewed  CBC WITH DIFFERENTIAL/PLATELET - Abnormal; Notable for the following components:      Result Value   WBC 13.6 (*)    Neutro Abs 12.1 (*)    Lymphs Abs 0.6 (*)    All other components within normal limits  COMPREHENSIVE METABOLIC PANEL - Abnormal; Notable for the following components:   Sodium 133 (*)    Chloride 97 (*)    Glucose, Bld 130 (*)    BUN 27 (*)    Creatinine, Ser 1.16 (*)    Total Protein 8.4 (*)    Alkaline Phosphatase 387 (*)    GFR, Estimated 49 (*)    All other components within normal limits  URINALYSIS, ROUTINE W REFLEX MICROSCOPIC - Abnormal; Notable for the following components:   Color, Urine YELLOW (*)    APPearance CLEAR (*)    Ketones, ur 5 (*)    Nitrite POSITIVE (*)    Bacteria, UA RARE (*)  All other components within normal limits  URINE CULTURE  CK  BASIC METABOLIC PANEL  CBC  TROPONIN I (HIGH SENSITIVITY)     EKG Atrial firbillation with RVR, VR 145. QRS 138, QTc 555. No acute ST elevations or depressions. No ischemia or infarct.   RADIOLOGY CT Head: NAICA CT C SPine; Negative CXR: Clear DG Pelvis: Clear CT Face: possible non displaced maxillary fx DG Shoulder R: Negative   I also independently reviewed and agree with radiologist interpretations.   PROCEDURES:  Critical Care performed: Yes, see critical care procedure note(s)  .Critical Care  Performed by: Shaune Pollack, MD Authorized by: Shaune Pollack, MD   Critical care provider statement:    Critical care time (minutes):  30   Critical care time was exclusive of:  Separately billable procedures and treating other patients   Critical care was necessary to treat or prevent imminent or life-threatening deterioration of the following conditions:  Cardiac failure, circulatory failure and respiratory failure   Critical care was time spent personally by me on the following activities:  Development of treatment plan with  patient or surrogate, discussions with consultants, evaluation of patient's response to treatment, examination of patient, ordering and review of laboratory studies, ordering and review of radiographic studies, ordering and performing treatments and interventions, pulse oximetry, re-evaluation of patient's condition and review of old charts      MEDICATIONS ORDERED IN ED: Medications  diltiazem (CARDIZEM) 1 mg/mL load via infusion 15 mg (15 mg Intravenous Bolus from Bag 09/26/23 1948)    And  diltiazem (CARDIZEM) 125 mg in dextrose 5% 125 mL (1 mg/mL) infusion (10 mg/hr Intravenous Rate/Dose Change 09/26/23 2113)  oxyCODONE (Oxy IR/ROXICODONE) immediate release tablet 5-10 mg (has no administration in time range)  amiodarone (PACERONE) tablet 100 mg (has no administration in time range)  traZODone (DESYREL) tablet 150 mg (has no administration in time range)  enoxaparin (LOVENOX) injection 40 mg (has no administration in time range)  acetaminophen (TYLENOL) tablet 650 mg (has no administration in time range)    Or  acetaminophen (TYLENOL) suppository 650 mg (has no administration in time range)  ondansetron (ZOFRAN) tablet 4 mg (has no administration in time range)    Or  ondansetron (ZOFRAN) injection 4 mg (has no administration in time range)  diltiazem (CARDIZEM) injection 10 mg (10 mg Intravenous Given 09/26/23 1709)  diltiazem (CARDIZEM) injection 10 mg (10 mg Intravenous Given 09/26/23 1810)  amiodarone (PACERONE) tablet 100 mg (100 mg Oral Given 09/26/23 1808)  metoprolol tartrate (LOPRESSOR) tablet 12.5 mg (12.5 mg Oral Given 09/26/23 1806)     IMPRESSION / MDM / ASSESSMENT AND PLAN / ED COURSE  I reviewed the triage vital signs and the nursing notes.                              Differential diagnosis includes, but is not limited to, AFib RVR, dehydration, AKI, rhabdo, occult UTI, PNA, CVA  Patient's presentation is most consistent with acute presentation with potential  threat to life or bodily function.  The patient is on the cardiac monitor to evaluate for evidence of arrhythmia and/or significant heart rate changes   77 year old female here with fall, generalized weakness.  The patient appears dehydrated on arrival, suspect she has been down for a significant amount of time.  She has atrial fibrillation with rapid ventricular response on arrival, which required IV diltiazem drip as she had minimal improvement with initial 2  IV doses in addition to her oral medications.  She appears dehydrated for which we will start fluids.  CBC shows mild likely reactive leukocytosis.  Urinalysis shows possible UTI.  CT head, C-spine, face obtained, reviewed, and show possible nondisplaced maxillary fracture.  Plain films are otherwise negative.  Discussed the case with family, who are concerned about patient being at home on her own.  Will admit to medicine.   FINAL CLINICAL IMPRESSION(S) / ED DIAGNOSES   Final diagnoses:  Fall, initial encounter  Atrial fibrillation with rapid ventricular response (HCC)  Weakness     Rx / DC Orders   ED Discharge Orders     None        Note:  This document was prepared using Dragon voice recognition software and may include unintentional dictation errors.   Shaune Pollack, MD 09/26/23 [phone]

## 2023-09-26 NOTE — Assessment & Plan Note (Addendum)
Frequent falls with injury (pelvic, radial, rib fracture, subarachnoid hemorrhage) Recurrent syncope, with prior history of orthostatic hypotension Frailty, protein calorie malnutrition, physical deconditioning Consider safety of living alone Will not pursue full syncopal workup due to frequent prior similar episodes full workup including EEGs Continuous cardiac monitoring Ice packs and pain control for current maxillary sinus fracture-can consider ENT outpatient referral PT and TOC consult-suspect patient will need placement

## 2023-09-26 NOTE — Assessment & Plan Note (Signed)
Mild elevation in creatinine from baseline Oral rehydration

## 2023-09-26 NOTE — Assessment & Plan Note (Signed)
Continue trazodone

## 2023-09-26 NOTE — ED Notes (Signed)
Pt to CT

## 2023-09-26 NOTE — ED Triage Notes (Signed)
Pt bib [facility] from home. Pt friends called at 12 noon yesterday with no response. Friends found pt face down at home at 3:00pm today. Pt has L hip and R arm pain. Pt has R facial swelling. NAD. Pt is a Hospice client and lives alone.  Pt is alert but has episodes of confusion.   Oxy 5mg  x2 benadryl

## 2023-09-26 NOTE — Assessment & Plan Note (Signed)
No acute issues.

## 2023-09-26 NOTE — Assessment & Plan Note (Addendum)
Continue diltiazem drip Continue  amiodarone.  Patient apparently no longer on Eliquis

## 2023-09-26 NOTE — Assessment & Plan Note (Signed)
Long-term opiate analgesic use Continue judicious use of oxycodone, gabapentin, Flexeril and trazodone Fall precautions

## 2023-09-26 NOTE — Assessment & Plan Note (Signed)
Delirium precautions

## 2023-09-26 NOTE — Assessment & Plan Note (Addendum)
Clinically euvolemic Continue metoprolol Last EF 10/2022 55 to 60%.

## 2023-09-27 ENCOUNTER — Inpatient Hospital Stay: Payer: PPO

## 2023-09-27 ENCOUNTER — Encounter: Payer: Self-pay | Admitting: Internal Medicine

## 2023-09-27 DIAGNOSIS — L8990 Pressure ulcer of unspecified site, unspecified stage: Secondary | ICD-10-CM | POA: Insufficient documentation

## 2023-09-27 DIAGNOSIS — I4891 Unspecified atrial fibrillation: Secondary | ICD-10-CM

## 2023-09-27 DIAGNOSIS — M329 Systemic lupus erythematosus, unspecified: Secondary | ICD-10-CM | POA: Insufficient documentation

## 2023-09-27 LAB — CBC
HCT: 34.4 % — ABNORMAL LOW (ref 36.0–46.0)
Hemoglobin: 12 g/dL (ref 12.0–15.0)
MCH: 30.8 pg (ref 26.0–34.0)
MCHC: 34.9 g/dL (ref 30.0–36.0)
MCV: 88.4 fL (ref 80.0–100.0)
Platelets: 266 10*3/uL (ref 150–400)
RBC: 3.89 MIL/uL (ref 3.87–5.11)
RDW: 13.2 % (ref 11.5–15.5)
WBC: 10 10*3/uL (ref 4.0–10.5)
nRBC: 0 % (ref 0.0–0.2)

## 2023-09-27 LAB — BASIC METABOLIC PANEL
Anion gap: 10 (ref 5–15)
BUN: 24 mg/dL — ABNORMAL HIGH (ref 8–23)
CO2: 25 mmol/L (ref 22–32)
Calcium: 8.5 mg/dL — ABNORMAL LOW (ref 8.9–10.3)
Chloride: 100 mmol/L (ref 98–111)
Creatinine, Ser: 1.09 mg/dL — ABNORMAL HIGH (ref 0.44–1.00)
GFR, Estimated: 52 mL/min — ABNORMAL LOW (ref >60)
Glucose, Bld: 103 mg/dL — ABNORMAL HIGH (ref 70–99)
Potassium: 3.5 mmol/L (ref 3.5–5.1)
Sodium: 135 mmol/L (ref 135–145)

## 2023-09-27 MED ORDER — ACETAMINOPHEN 650 MG RE SUPP: 650.0000 mg | Freq: Four times a day (QID) | RECTAL | Status: DC | PRN

## 2023-09-27 MED ORDER — GABAPENTIN 100 MG PO CAPS
100.0000 mg | ORAL_CAPSULE | Freq: Three times a day (TID) | ORAL | Status: DC
  Administered 2023-09-27 – 2023-09-30 (×10): 100 mg via ORAL
  Filled 2023-09-27 (×10): qty 1

## 2023-09-27 MED ORDER — ENSURE ENLIVE PO LIQD
237.0000 mL | Freq: Two times a day (BID) | ORAL | Status: DC
  Administered 2023-09-27 – 2023-09-28 (×3): 237 mL via ORAL

## 2023-09-27 MED ORDER — SENNA 8.6 MG PO TABS: 1.0000 | ORAL_TABLET | Freq: Every evening | ORAL | Status: DC | PRN

## 2023-09-27 MED ORDER — ACETAMINOPHEN 325 MG PO TABS
650.0000 mg | ORAL_TABLET | Freq: Four times a day (QID) | ORAL | Status: DC | PRN
  Administered 2023-09-27 – 2023-09-29 (×2): 650 mg via ORAL
  Filled 2023-09-27 (×3): qty 2

## 2023-09-27 MED ORDER — ARTIFICIAL TEARS OPHTHALMIC OINT: TOPICAL_OINTMENT | OPHTHALMIC | Status: DC | PRN

## 2023-09-27 MED ORDER — METOPROLOL TARTRATE 25 MG PO TABS
12.5000 mg | ORAL_TABLET | Freq: Two times a day (BID) | ORAL | Status: DC
  Administered 2023-09-27 (×2): 12.5 mg via ORAL
  Filled 2023-09-27 (×2): qty 1

## 2023-09-27 MED ORDER — ADULT MULTIVITAMIN W/MINERALS CH
1.0000 | ORAL_TABLET | Freq: Every day | ORAL | Status: DC
  Administered 2023-09-27 – 2023-09-30 (×4): 1 via ORAL
  Filled 2023-09-27 (×4): qty 1

## 2023-09-27 MED ORDER — CYCLOBENZAPRINE HCL 10 MG PO TABS
5.0000 mg | ORAL_TABLET | Freq: Three times a day (TID) | ORAL | Status: DC | PRN
  Administered 2023-09-28 – 2023-09-29 (×2): 5 mg via ORAL
  Filled 2023-09-27 (×2): qty 1

## 2023-09-27 NOTE — Evaluation (Signed)
Occupational Therapy Evaluation Patient Details Name: Donna Evans MRN: [id number] DOB: 14-Jul-1946 Today's Date: 09/27/2023   History of Present Illness 77 y.o. female with medical history significant for hypertension, CVA, depression with anxiety, migraine headache, right breast cancer s/p mastectomy, chronic pain syndrome, HFpEF, CKD stage IIIa, COPD, SLE, A-fib on amiodarone, no longer on Eliquis , history of falls, syncope and balance issues, dementia with prior in-hospital delirium , last hospitalized in April 2024 with a subdural hematoma and subarachnoid hemorrhage, recurrent syncope with negative EEG in April, who presents to the ED with a fall. Noted for afib with RVR, maxillary sinus fx   Clinical Impression   Donna Evans was seen for OT/PT co-evaluation this date. Prior to hospital admission, pt was MOD I using rollator, reports falls. Pt lives alone with friends available intermittently. Pt is tangential and hyperverbal; disoriented to situation and significant STM deficits (baseline) are evident t/o session. Pt currently requires MIN A + HHA for BSC t/f, improves to CGA + RW for simulated toilet t/f - intermittent MIN A for balance/impusivity. MIN A for clothing management and pericare in standing. Pt would benefit from skilled OT to address noted impairments and functional limitations (see below for any additional details). Upon hospital discharge, recommend 24/7 SUPERVISION for safety on discharge.    If plan is discharge home, recommend the following: A little help with walking and/or transfers;A little help with bathing/dressing/bathroom;Supervision due to cognitive status    Functional Status Assessment  Patient has had a recent decline in their functional status and demonstrates the ability to make significant improvements in function in a reasonable and predictable amount of time.  Equipment Recommendations  BSC/3in1    Recommendations for Other Services       Precautions  / Restrictions Precautions Precautions: Fall Restrictions Weight Bearing Restrictions: No      Mobility Bed Mobility Overal bed mobility: Needs Assistance Bed Mobility: Supine to Sit, Sit to Supine     Supine to sit: Supervision Sit to supine: Supervision        Transfers Overall transfer level: Needs assistance Equipment used: 1 person hand held assist Transfers: Sit to/from Stand, Bed to chair/wheelchair/BSC Sit to Stand: Min assist Stand pivot transfers: Min assist                Balance Overall balance assessment: Needs assistance Sitting-balance support: No upper extremity supported, Feet supported Sitting balance-Leahy Scale: Good     Standing balance support: During functional activity, Single extremity supported Standing balance-Leahy Scale: Fair                             ADL either performed or assessed with clinical judgement   ADL Overall ADL's : Needs assistance/impaired                                       General ADL Comments: MIN A + HHA for BSC t/f, improves to CGA + RW for simulated toilet t/f - intermittent MIN A for balance/impusivity. MIN A for clothing management and pericare in standing.      Pertinent Vitals/Pain Pain Assessment Pain Assessment: No/denies pain     Extremity/Trunk Assessment Upper Extremity Assessment Upper Extremity Assessment: RUE deficits/detail RUE Deficits / Details: 3/5 grossly, endorses hx of CVA   Lower Extremity Assessment Lower Extremity Assessment: Generalized weakness  Communication Communication Communication: Difficulty communicating thoughts/reduced clarity of speech Cueing Techniques: Verbal cues;Tactile cues   Cognition Arousal: Alert Behavior During Therapy: Impulsive Overall Cognitive Status: History of cognitive impairments - at baseline                                 General Comments: tangential and hyperverbal. Disoriented to  situation and significant STM deficits (baseline) are evident t/o session                Home Living Family/patient expects to be discharged to:: Private residence Living Arrangements: Alone Available Help at Discharge: Available PRN/intermittently;Friend(s);Home health Type of Home: House Home Access: Stairs to enter Entrance Stairs-Number of Steps: 1 Entrance Stairs-Rails: None Home Layout: One level     Bathroom Shower/Tub: Producer, television/film/video: Handicapped height     Home Equipment: Shower seat;Grab bars - toilet;Rollator (4 wheels);Cane - single Librarian, academic (2 wheels)   Additional Comments: home setup per chart      Prior Functioning/Environment Prior Level of Function : Needs assist;History of Falls (last six months)                        OT Problem List: Decreased strength;Decreased range of motion;Decreased activity tolerance;Impaired balance (sitting and/or standing);Decreased safety awareness      OT Treatment/Interventions: Therapeutic exercise;Self-care/ADL training;DME and/or AE instruction;Energy conservation;Therapeutic activities;Patient/family education;Balance training    OT Goals(Current goals can be found in the care plan section) Acute Rehab OT Goals Patient Stated Goal: to return to PLOF OT Goal Formulation: With patient Time For Goal Achievement: 10/11/23 Potential to Achieve Goals: Fair ADL Goals Pt Will Perform Grooming: standing;with supervision Pt Will Perform Lower Body Dressing: with supervision;sit to/from stand Pt Will Transfer to Toilet: with supervision;ambulating;regular height toilet Pt Will Perform Toileting - Clothing Manipulation and hygiene: with supervision;sit to/from stand  OT Frequency: Min 1X/week    Co-evaluation PT/OT/SLP Co-Evaluation/Treatment: Yes Reason for Co-Treatment: Necessary to address cognition/behavior during functional activity PT goals addressed during session:  Mobility/safety with mobility OT goals addressed during session: ADL's and self-care      AM-PAC OT "6 Clicks" Daily Activity     Outcome Measure Help from another person eating meals?: None Help from another person taking care of personal grooming?: A Little Help from another person toileting, which includes using toliet, bedpan, or urinal?: A Little Help from another person bathing (including washing, rinsing, drying)?: A Lot Help from another person to put on and taking off regular upper body clothing?: A Little Help from another person to put on and taking off regular lower body clothing?: A Lot 6 Click Score: 17   End of Session Equipment Utilized During Treatment: Rolling walker (2 wheels);Gait belt  Activity Tolerance: Patient tolerated treatment well Patient left: in bed;with call bell/phone within reach;with bed alarm set  OT Visit Diagnosis: Other abnormalities of gait and mobility (R26.89);Muscle weakness (generalized) (M62.81)                Time: 4098-1191 OT Time Calculation (min): 22 min Charges:  OT General Charges $OT Visit: 1 Visit OT Evaluation $OT Eval Moderate Complexity: 1 Mod  Kathie Dike, M.S. OTR/L  09/27/23, 3:13 PM  ascom [phone]

## 2023-09-27 NOTE — Evaluation (Signed)
Physical Therapy Evaluation Patient Details Name: Donna Evans MRN: [id number] DOB: 1946/10/18 Today's Date: 09/27/2023  History of Present Illness  77 y.o. female with medical history significant for hypertension, CVA, depression with anxiety, migraine headache, right breast cancer s/p mastectomy, chronic pain syndrome, HFpEF, CKD stage IIIa, COPD, SLE, A-fib on amiodarone, no longer on Eliquis , history of falls, syncope and balance issues, dementia with prior in-hospital delirium , last hospitalized in April 2024 with a subdural hematoma and subarachnoid hemorrhage, recurrent syncope with negative EEG in April, who presents to the ED with a fall. Noted for afib with RVR, maxillary sinus fx.   Clinical Impression  Patient alert and impulsive during session, oriented to self but displayed STM deficits and disoriented to situation throughout session, often required cueing (verbal and tactile). She was able to perform bed mobility with supervision, sit <> stand with RW/handheld assist CGA-minA for occasional steadying. She ambulated in room ~46ft with RW and CGA-minA HR in 120s. Returned to bed with needs in reach.  Overall the patient demonstrated deficits (see "PT Problem List") that impede the patient's functional abilities, safety, and mobility and would benefit from skilled PT intervention.   Of note the pt reported RUE weakness since her "stroke". Pt unable to quantify how long symptoms had been going on besides "a few months" when provided with choices. Pt also exhibited R eyelid paralysis (did not blink), RN notified.         If plan is discharge home, recommend the following: A little help with walking and/or transfers;A little help with bathing/dressing/bathroom;Assistance with cooking/housework;Supervision due to cognitive status;Assist for transportation;Help with stairs or ramp for entrance;Direct supervision/assist for medications management   Can travel by private vehicle         Equipment Recommendations Other (comment) (TBD)  Recommendations for Other Services       Functional Status Assessment Patient has had a recent decline in their functional status and demonstrates the ability to make significant improvements in function in a reasonable and predictable amount of time.     Precautions / Restrictions Precautions Precautions: Fall Restrictions Weight Bearing Restrictions: No      Mobility  Bed Mobility Overal bed mobility: Needs Assistance Bed Mobility: Supine to Sit, Sit to Supine     Supine to sit: Supervision Sit to supine: Supervision        Transfers Overall transfer level: Needs assistance Equipment used: 1 person hand held assist, Rolling walker (2 wheels) Transfers: Sit to/from Stand, Bed to chair/wheelchair/BSC Sit to Stand: Contact guard assist Stand pivot transfers: Min assist              Ambulation/Gait Ambulation/Gait assistance: Contact guard assist, Min assist Gait Distance (Feet): 20 Feet           General Gait Details: occasional steadying needed, constant cues for RW and for safety awareness  Stairs            Wheelchair Mobility     Tilt Bed    Modified Rankin (Stroke Patients Only)       Balance Overall balance assessment: Needs assistance Sitting-balance support: No upper extremity supported, Feet supported Sitting balance-Leahy Scale: Good     Standing balance support: During functional activity, Single extremity supported Standing balance-Leahy Scale: Fair                               Pertinent Vitals/Pain Pain Assessment Pain Assessment: No/denies pain  Home Living Family/patient expects to be discharged to:: Private residence Living Arrangements: Alone Available Help at Discharge: Available PRN/intermittently;Friend(s);Home health Type of Home: House Home Access: Stairs to enter Entrance Stairs-Rails: None Entrance Stairs-Number of Steps: 1   Home Layout:  One level Home Equipment: Shower seat;Grab bars - toilet;Rollator (4 wheels);Cane - single Librarian, academic (2 wheels) Additional Comments: home setup per chart    Prior Function Prior Level of Function : Needs assist;History of Falls (last six months)             Mobility Comments: pt stated she uses a rollator at home       Extremity/Trunk Assessment   Upper Extremity Assessment Upper Extremity Assessment: Defer to OT evaluation RUE Deficits / Details: 3/5 grossly, endorses hx of CVA    Lower Extremity Assessment Lower Extremity Assessment: Generalized weakness (able to lift BLE against gravity, questionable coordination deficits)       Communication   Communication Communication: Difficulty communicating thoughts/reduced clarity of speech Cueing Techniques: Verbal cues;Tactile cues  Cognition Arousal: Alert Behavior During Therapy: Impulsive Overall Cognitive Status: History of cognitive impairments - at baseline                                 General Comments: tangential and hyperverbal. Disoriented to situation and significant STM deficits (baseline) are evident t/o session        General Comments      Exercises     Assessment/Plan    PT Assessment Patient needs continued PT services  PT Problem List Decreased strength;Decreased activity tolerance;Decreased balance;Decreased mobility;Decreased safety awareness       PT Treatment Interventions DME instruction;Neuromuscular re-education;Gait training;Stair training;Patient/family education;Functional mobility training;Therapeutic activities;Therapeutic exercise;Balance training    PT Goals (Current goals can be found in the Care Plan section)  Acute Rehab PT Goals PT Goal Formulation: Patient unable to participate in goal setting Time For Goal Achievement: 10/11/23 Potential to Achieve Goals: Fair    Frequency Min 1X/week     Co-evaluation PT/OT/SLP Co-Evaluation/Treatment:  Yes Reason for Co-Treatment: Necessary to address cognition/behavior during functional activity PT goals addressed during session: Mobility/safety with mobility OT goals addressed during session: ADL's and self-care       AM-PAC PT "6 Clicks" Mobility  Outcome Measure Help needed turning from your back to your side while in a flat bed without using bedrails?: None Help needed moving from lying on your back to sitting on the side of a flat bed without using bedrails?: None Help needed moving to and from a bed to a chair (including a wheelchair)?: A Little Help needed standing up from a chair using your arms (e.g., wheelchair or bedside chair)?: A Little Help needed to walk in hospital room?: A Little Help needed climbing 3-5 steps with a railing? : A Little 6 Click Score: 20    End of Session Equipment Utilized During Treatment: Gait belt Activity Tolerance: Patient tolerated treatment well Patient left: in bed;with call bell/phone within reach;with bed alarm set Nurse Communication: Mobility status PT Visit Diagnosis: Other abnormalities of gait and mobility (R26.89);Difficulty in walking, not elsewhere classified (R26.2);Muscle weakness (generalized) (M62.81)    Time: 8657-8469 PT Time Calculation (min) (ACUTE ONLY): 26 min   Charges:   PT Evaluation $PT Eval Moderate Complexity: 1 Mod PT Treatments $Therapeutic Activity: 8-22 mins PT General Charges $$ ACUTE PT VISIT: 1 Visit         Olga Coaster PT,  DPT 3:21 PM,09/27/23

## 2023-09-27 NOTE — Progress Notes (Signed)
Initial Nutrition Assessment  DOCUMENTATION CODES:   Severe malnutrition in context of chronic illness  INTERVENTION:   -Ensure Enlive po BID, each supplement provides 350 kcal and 20 grams of protein -MVI with minerals daily -Liberalize diet to regular for widest variety of meal selections  NUTRITION DIAGNOSIS:   Severe Malnutrition related to chronic illness (CHF) as evidenced by moderate fat depletion, severe fat depletion, moderate muscle depletion, severe muscle depletion.  GOAL:   Patient will meet greater than or equal to 90% of their needs  MONITOR:   PO intake, Supplement acceptance  REASON FOR ASSESSMENT:   Malnutrition Screening Tool    ASSESSMENT:   Pt with medical history significant for hypertension, CVA, depression with anxiety, migraine headache, right breast cancer s/p mastectomy, chronic pain syndrome, HFpEF, CKD stage IIIa, COPD, SLE, A-fib on amiodarone, no longer on Eliquis , history of falls, syncope and balance issues, dementia with prior in-hospital delirium who presents with a fall.  Pt admitted with a-fib with RVR and fall at home with maxillary sinus fracture.   Reviewed I/O's: -134 ml x 24 hours  UOP: 4000 ml x 24 hours  Spoke with pt at bedside, who was pleasant, but with some confusion at time of visit. She needed frequent redirection and it was difficult to obtain history because of this.    Pt reports she is hungry; helped her order lunch.   She was able to confirm that she lived at home PTA and had friends who helped her. She was active with hospice.   Reviewed wt hx; no wt loss noted over the past 6 months.   Medications reviewed.   Per TOC notes, awaiting insurance authorization for SNF.   Lab Results  Component Value Date   HGBA1C 5.1 04/28/2022   PTA DM medications are none .   Labs reviewed: CBGS: 170 (inpatient orders for glycemic control are none).    NUTRITION - FOCUSED PHYSICAL EXAM:  Flowsheet Row Most Recent Value   Orbital Region Severe depletion  Upper Arm Region Severe depletion  Thoracic and Lumbar Region Moderate depletion  Buccal Region Moderate depletion  Temple Region Severe depletion  Clavicle Bone Region Severe depletion  Clavicle and Acromion Bone Region Severe depletion  Scapular Bone Region Severe depletion  Dorsal Hand Severe depletion  Patellar Region Severe depletion  Anterior Thigh Region Severe depletion  Posterior Calf Region Severe depletion  Edema (RD Assessment) None  Hair Reviewed  Eyes Reviewed  Mouth Reviewed  Skin Reviewed  Nails Reviewed       Diet Order:   Diet Order             Diet regular Room service appropriate? Yes; Fluid consistency: Thin  Diet effective now                   EDUCATION NEEDS:   Education needs have been addressed  Skin:  Skin Assessment: Skin Integrity Issues: Skin Integrity Issues:: Stage II Stage II: buttocks  Last BM:  Unknown  Height:   Ht Readings from Last 1 Encounters:  09/26/23 5\' 7"  (1.702 m)    Weight:   Wt Readings from Last 1 Encounters:  09/26/23 53.9 kg    Ideal Body Weight:  61.4 kg  BMI:  Body mass index is 18.62 kg/m.  Estimated Nutritional Needs:   Kcal:  1600-1800  Protein:  85-100 grams  Fluid:  > 1.6 L    Levada Schilling, RD, LDN, CDCES Registered Dietitian III Certified Diabetes Care and  Education Specialist Please refer to [facility] for RD and/or RD on-call/weekend/after hours pager

## 2023-09-27 NOTE — [place] FL2 (Signed)
[place] MEDICAID FL2 LEVEL OF CARE FORM     IDENTIFICATION  Patient Name: Donna Evans Birthdate: [birth year] Sex: female Admission Date (Current Location): 09/26/2023  [facility] and [place] Number:  Chiropodist and Address:  [facility], [address] [place], [place] [address]      Provider Number: [id number]  Attending Physician Name and Address:  Kathrynn Running, MD  Relative Name and Phone Number:  [name] nephew  [phone]    Current Level of Care: Hospital Recommended Level of Care: Skilled Nursing Facility Prior Approval Number:    Date Approved/Denied:   PASRR Number: [id number]  Discharge Plan: SNF    Current Diagnoses: Patient Active Problem List   Diagnosis Date Noted   Pressure injury of skin 09/27/2023   SLE (systemic lupus erythematosus related syndrome) (HCC) 09/27/2023   Dementia, with history of inpatient delirium/encephalopathy (HCC) 09/26/2023   Frailty 09/26/2023   Maxillary sinus fracture, closed, initial encounter (HCC) 09/26/2023   COPD (chronic obstructive pulmonary disease) (HCC)    Prinzmetal's angina (HCC) 08/30/2023   Peripheral neuropathy 08/30/2023   Tongue ulceration 03/29/2023   Cellulitis of right foot 03/26/2023   Atrial fibrillation with rapid ventricular response (HCC) 03/26/2023   Paroxysmal atrial fibrillation (HCC) 03/25/2023   Hypophosphatemia 03/25/2023   Multiple rib fractures 03/25/2023   Compression fracture of L2 (HCC) 03/25/2023   Subdural hematoma (HCC) 03/24/2023   Subarachnoid hemorrhage (HCC) 03/24/2023   Alcohol use 03/24/2023   Protein-calorie malnutrition, severe (HCC) 03/24/2023   Hypomagnesemia 03/24/2023   Acute urinary retention 03/24/2023   Goals of care, counseling/discussion 03/01/2023   Frequent falls with injury (pelvic rib and radial fractures, subdural and subarachnoid hemorrhage) 02/01/2023   Atrial fibrillation, chronic (HCC) 02/01/2023   Hypokalemia  02/01/2023   Bleeding 02/01/2023   Fracture, finger 11/18/2022   Orthostatic hypotension 11/17/2022   Encephalopathy 11/10/2022   CKD (chronic kidney disease), stage III (HCC) 11/02/2022   Metabolic acidosis 11/02/2022   Steatohepatitis, non-alcoholic 11/02/2022   Delirium due to another medical condition 10/08/2022   Bilateral leg edema    Head injury    Ground-level fall 10/06/2022   Acute encephalopathy 10/06/2022   Lymphedema 10/06/2022   Elevated LFTs 10/06/2022   HLD (hyperlipidemia) 08/10/2022   Alkaline phosphatase elevation 08/10/2022   Spondylosis without myelopathy 07/22/2022   Elevated troponin 04/28/2022   Stroke (HCC) 04/28/2022   Fall at home with maxillary sinus fracture, initial encounter 04/28/2022   Acute renal failure superimposed on stage 3a chronic kidney disease (HCC) 04/28/2022   Chronic diastolic CHF (congestive heart failure) (HCC) 04/28/2022   Venous insufficiency 03/31/2022   SOB (shortness of breath) on exertion 03/08/2022   Pedal edema 03/08/2022   Closed fracture of distal end of left radius 12/22/2021   Low back pain 12/17/2021   Fracture of pelvis (HCC) 11/13/2021   Bilateral leg weakness 05/13/2021   Memory change 03/04/2021   Imbalance 03/04/2021   Falls frequently 03/04/2021   Dizziness 03/04/2021   Senile osteoporosis 10/18/2019   Long term current use of opiate analgesic 07/04/2017   Long term prescription opiate use 07/04/2017   Opiate use 07/04/2017   Bursitis of shoulder 07/04/2017   Closed fracture of upper end of humerus 07/04/2017   Knee pain 07/04/2017   Localized, primary osteoarthritis 07/04/2017   Osteoarthritis of knee 07/04/2017   Pain in limb 07/04/2017   Sprain of wrist 07/04/2017   Pain in joint involving ankle and foot 07/04/2017   Impingement syndrome of  left shoulder region 06/29/2017   Trochanteric bursitis 06/29/2017   AKI (acute kidney injury) (HCC) 10/01/2016   Syncope 09/30/2016   Migraine headache  08/10/2016   Controlled substance agreement signed 06/12/2016   Insomnia 06/12/2016   Hypertension, benign 02/04/2016   Easy bruisability 11/13/2015   Osteoporosis 11/13/2015   Fibromyalgia syndrome 11/13/2015   Generalized anxiety disorder 11/13/2015   Chronic pain syndrome 11/13/2015   Overactive detrusor 09/11/2015   Adrenal adenoma 09/02/2015   Chronic cystitis 09/02/2015   Incomplete bladder emptying 09/02/2015   Renal colic 09/02/2015   Gross hematuria 07/28/2015   History of reconstruction of both breasts 06/18/2015   S/P cardiac catheterization 06/18/2015   Breast cancer with Hx of mastectomy, bilateral with reconstruction 11/20/2014   Transient cerebral ischemia 11/28/2013   Hemiplegia of dominant side (HCC) 11/20/2013    Orientation RESPIRATION BLADDER Height & Weight     Self, Time  Normal Incontinent Weight: 118 lb 14.4 oz (53.9 kg) Height:  5\' 7"  (170.2 cm)  BEHAVIORAL SYMPTOMS/MOOD NEUROLOGICAL BOWEL NUTRITION STATUS      Continent Diet (see discharge summary)  AMBULATORY STATUS COMMUNICATION OF NEEDS Skin   Extensive Assist Verbally Other (Comment), PU Stage and Appropriate Care (pressure injury stage 2 buttocks)                       Personal Care Assistance Level of Assistance  Bathing, Feeding, Dressing, Total care Bathing Assistance: Maximum assistance Feeding assistance: Limited assistance Dressing Assistance: Maximum assistance Total Care Assistance: Maximum assistance   Functional Limitations Info  Sight, Hearing, Speech Sight Info: Adequate Hearing Info: Adequate Speech Info: Impaired    SPECIAL CARE FACTORS FREQUENCY                       Contractures Contractures Info: Not present    Additional Factors Info  Code Status, Allergies Code Status Info: dnr limited Allergies Info: Ciprofloxacin  Codeine  Fluconazole  Latex  Meperidine  Morphine And Codeine  Doxycycline  Flagyl (Metronidazole)  Influenza Vaccines  Tape  Valtrex  (Valacyclovir Hcl)  Buprenorphine  Duloxetine  Silicone  Tramadol  Amoxicillin-pot Clavulanate  Sulfamethoxazole-trimethoprim  Valacyclovir           Current Medications (09/27/2023):  This is the current hospital active medication list Current Facility-Administered Medications  Medication Dose Route Frequency Provider Last Rate Last Admin   acetaminophen (TYLENOL) tablet 650 mg  650 mg Oral Q6H PRN Wouk, Wilfred Curtis, MD       Or   acetaminophen (TYLENOL) suppository 650 mg  650 mg Rectal Q6H PRN Wouk, Wilfred Curtis, MD       amiodarone (PACERONE) tablet 100 mg  100 mg Oral Daily Lindajo Royal V, MD   100 mg at 09/27/23 0930   cyclobenzaprine (FLEXERIL) tablet 5 mg  5 mg Oral TID PRN Kathrynn Running, MD       enoxaparin (LOVENOX) injection 40 mg  40 mg Subcutaneous Q24H Lindajo Royal V, MD   40 mg at 09/27/23 0931   gabapentin (NEURONTIN) capsule 100 mg  100 mg Oral TID Kathrynn Running, MD       metoprolol tartrate (LOPRESSOR) tablet 12.5 mg  12.5 mg Oral BID Kathrynn Running, MD       ondansetron [facility]) tablet 4 mg  4 mg Oral Q6H PRN Andris Baumann, MD       Or   ondansetron [facility]) injection 4 mg  4 mg Intravenous Q6H  PRN Andris Baumann, MD       oxyCODONE (Oxy IR/ROXICODONE) immediate release tablet 5-10 mg  5-10 mg Oral Q4H PRN Andris Baumann, MD   10 mg at 09/27/23 0109   traZODone (DESYREL) tablet 150 mg  150 mg Oral QHS Andris Baumann, MD   150 mg at 09/27/23 0110     Discharge Medications: Please see discharge summary for a list of discharge medications.  Relevant Imaging Results:  Relevant Lab Results:   Additional Information SSN: [id number]  Darolyn Rua, LCSW

## 2023-09-27 NOTE — TOC Initial Note (Signed)
Transition of Care [facility]) - Initial/Assessment Note    Patient Details  Name: Donna Evans MRN: [id number] Date of Birth: [birth year]  [facility] CM/SW Contact:    Darolyn Rua, LCSW Phone Number: 09/27/2023, 10:45 AM  Clinical Narrative:                  CSW met with patient at bedside along with Bridget Hartshorn patient's friend. Jamey provided history as patient was not fully oriented.   Patient is from home along, family and friends have been checking in on patient however she does not have around the clock care. She has tried sitters in the past however does not like people in her home and would not have funds to continue that long term.   At this time Bridget Hartshorn reports patietn ha been unable to care for self at home, patietn was with [place] hospice services but discharged from their services when admitted to hospital. They are agreeable to look at [facility] hospice as another option, and agree patient would be safest going to LTC facility with hospice, preference of [facility].   CSW spoke with POA [name] over the phone regarding above, he reports he is in agreement with patient going to LTC facility with hospice, if able to be accepted at [facility] they would use their hospice services if needed or go through [facility].   Referral sent to [facility] pending acceptance.    Expected Discharge Plan:  (LTC with hospice) Barriers to Discharge: Continued Medical Work up   Patient Goals and CMS Choice Patient states their goals for this hospitalization and ongoing recovery are:: to go home CMS Medicare.gov Compare Post Acute Care list provided to:: Patient Choice offered to / list presented to : Patient      Expected Discharge Plan and Services       Living arrangements for the past 2 months: Single Family Home                                      Prior Living Arrangements/Services Living arrangements for the past 2 months: Single Family  Home Lives with:: Self                   Activities of Daily Living   ADL Screening (condition at time of admission) Independently performs ADLs?: Yes (appropriate for developmental age) Is the patient deaf or have difficulty hearing?: No Does the patient have difficulty seeing, even when wearing glasses/contacts?: No Does the patient have difficulty concentrating, remembering, or making decisions?: Yes  Permission Sought/Granted                  Emotional Assessment              Admission diagnosis:  Atrial fibrillation with rapid ventricular response (HCC) [I48.91] Patient Active Problem List   Diagnosis Date Noted   Pressure injury of skin 09/27/2023   SLE (systemic lupus erythematosus related syndrome) (HCC) 09/27/2023   Dementia, with history of inpatient delirium/encephalopathy (HCC) 09/26/2023   Frailty 09/26/2023   Maxillary sinus fracture, closed, initial encounter (HCC) 09/26/2023   COPD (chronic obstructive pulmonary disease) (HCC)    Prinzmetal's angina (HCC) 08/30/2023   Peripheral neuropathy 08/30/2023   Tongue ulceration 03/29/2023   Cellulitis of right foot 03/26/2023   Atrial fibrillation with rapid ventricular response (HCC) 03/26/2023   Paroxysmal atrial fibrillation (HCC) 03/25/2023  Hypophosphatemia 03/25/2023   Multiple rib fractures 03/25/2023   Compression fracture of L2 (HCC) 03/25/2023   Subdural hematoma (HCC) 03/24/2023   Subarachnoid hemorrhage (HCC) 03/24/2023   Alcohol use 03/24/2023   Protein-calorie malnutrition, severe (HCC) 03/24/2023   Hypomagnesemia 03/24/2023   Acute urinary retention 03/24/2023   Goals of care, counseling/discussion 03/01/2023   Frequent falls with injury (pelvic rib and radial fractures, subdural and subarachnoid hemorrhage) 02/01/2023   Atrial fibrillation, chronic (HCC) 02/01/2023   Hypokalemia 02/01/2023   Bleeding 02/01/2023   Fracture, finger 11/18/2022   Orthostatic hypotension 11/17/2022    Encephalopathy 11/10/2022   CKD (chronic kidney disease), stage III (HCC) 11/02/2022   Metabolic acidosis 11/02/2022   Steatohepatitis, non-alcoholic 11/02/2022   Delirium due to another medical condition 10/08/2022   Bilateral leg edema    Head injury    Ground-level fall 10/06/2022   Acute encephalopathy 10/06/2022   Lymphedema 10/06/2022   Elevated LFTs 10/06/2022   HLD (hyperlipidemia) 08/10/2022   Alkaline phosphatase elevation 08/10/2022   Spondylosis without myelopathy 07/22/2022   Elevated troponin 04/28/2022   Stroke (HCC) 04/28/2022   Fall at home with maxillary sinus fracture, initial encounter 04/28/2022   Acute renal failure superimposed on stage 3a chronic kidney disease (HCC) 04/28/2022   Chronic diastolic CHF (congestive heart failure) (HCC) 04/28/2022   Venous insufficiency 03/31/2022   SOB (shortness of breath) on exertion 03/08/2022   Pedal edema 03/08/2022   Closed fracture of distal end of left radius 12/22/2021   Low back pain 12/17/2021   Fracture of pelvis (HCC) 11/13/2021   Bilateral leg weakness 05/13/2021   Memory change 03/04/2021   Imbalance 03/04/2021   Falls frequently 03/04/2021   Dizziness 03/04/2021   Senile osteoporosis 10/18/2019   Long term current use of opiate analgesic 07/04/2017   Long term prescription opiate use 07/04/2017   Opiate use 07/04/2017   Bursitis of shoulder 07/04/2017   Closed fracture of upper end of humerus 07/04/2017   Knee pain 07/04/2017   Localized, primary osteoarthritis 07/04/2017   Osteoarthritis of knee 07/04/2017   Pain in limb 07/04/2017   Sprain of wrist 07/04/2017   Pain in joint involving ankle and foot 07/04/2017   Impingement syndrome of left shoulder region 06/29/2017   Trochanteric bursitis 06/29/2017   AKI (acute kidney injury) (HCC) 10/01/2016   Syncope 09/30/2016   Migraine headache 08/10/2016   Controlled substance agreement signed 06/12/2016   Insomnia 06/12/2016   Hypertension, benign  02/04/2016   Easy bruisability 11/13/2015   Osteoporosis 11/13/2015   Fibromyalgia syndrome 11/13/2015   Generalized anxiety disorder 11/13/2015   Chronic pain syndrome 11/13/2015   Overactive detrusor 09/11/2015   Adrenal adenoma 09/02/2015   Chronic cystitis 09/02/2015   Incomplete bladder emptying 09/02/2015   Renal colic 09/02/2015   Gross hematuria 07/28/2015   History of reconstruction of both breasts 06/18/2015   S/P cardiac catheterization 06/18/2015   Breast cancer with Hx of mastectomy, bilateral with reconstruction 11/20/2014   Transient cerebral ischemia 11/28/2013   Hemiplegia of dominant side (HCC) 11/20/2013   PCP:  No primary care provider on file. Pharmacy:   [facility] - [place], [place] - [address]er [place] [address] Phone: [phone] Fax: [phone]  [id number] [place] [place] - [address] [place] [address] Phone: [phone] Fax: [phone]  [id number] [place], [place] - [address] [place] [address] Phone: [phone] Fax: [phone]  [facility] - [facility] [address] [place] [address] Phone: [phone] Fax: [phone]     Social Determinants of Health (SDOH) Social History: SDOH Screenings   Food Insecurity: No Food Insecurity (05/22/2023)  Housing: Low Risk  (09/27/2023)  Transportation Needs: No Transportation Needs (09/27/2023)  Utilities: Patient Unable To Answer (09/27/2023)  Depression (PHQ2-9): Low Risk  (07/13/2023)  Recent Concern: Depression (PHQ2-9) - High Risk (04/25/2023)  Financial Resource Strain: Low Risk  (05/22/2023)  Physical Activity: Unknown (05/22/2023)  Social Connections: Socially Isolated (05/22/2023)  Stress: No Stress Concern Present (05/22/2023)  Tobacco Use: Medium Risk (07/13/2023)   SDOH Interventions:     Readmission Risk Interventions    03/24/2023     2:47 PM  Readmission Risk Prevention Plan  Transportation Screening Complete  Medication Review (RN Care Manager) Complete  PCP or Specialist appointment within 3-5 days of discharge Complete  HRI or Home Care Consult Complete  SW Recovery Care/Counseling Consult Complete  Palliative Care Screening Not Applicable  Skilled Nursing Facility Complete

## 2023-09-27 NOTE — Progress Notes (Addendum)
PROGRESS NOTE    Donna Evans  ZOX:[id number] DOB: 02/21/1946 DOA: 09/26/2023 PCP: No primary care provider on file.     Brief Narrative:   Donna Evans is a 77 y.o. female with medical history significant for hypertension, CVA, depression with anxiety, migraine headache, right breast cancer s/p mastectomy, chronic pain syndrome, HFpEF, CKD stage IIIa, COPD, SLE, A-fib on amiodarone, no longer on Eliquis , history of falls, syncope and balance issues, dementia with prior in-hospital delirium , last hospitalized in April 2024 with a subdural hematoma and subarachnoid hemorrhage, recurrent syncope with negative EEG in April, who presents to the ED with a fall. Patient remained was getting to the door sometime the night prior but then fell.  She lost consciousness.  When she came around she was unable to get up.  Friends called earlier in the day and did not get her and when the checked on her at 3 PM they found her on the floor.   Assessment & Plan:   Principal Problem:   Atrial fibrillation with rapid ventricular response (HCC) Active Problems:   Fall at home with maxillary sinus fracture, initial encounter   Syncope   Frequent falls with injury (pelvic rib and radial fractures, subdural and subarachnoid hemorrhage)   AKI (acute kidney injury) (HCC)   Dementia, with history of inpatient delirium/encephalopathy (HCC)   Maxillary sinus fracture, closed, initial encounter (HCC)   Chronic diastolic CHF (congestive heart failure) (HCC)   Alkaline phosphatase elevation   COPD (chronic obstructive pulmonary disease) (HCC)   Chronic pain syndrome   Long term current use of opiate analgesic   Breast cancer with Hx of mastectomy, bilateral with reconstruction   Hemiplegia of dominant side (HCC)   Fibromyalgia syndrome   Generalized anxiety disorder   Stroke (HCC)   CKD (chronic kidney disease), stage III (HCC)   Protein-calorie malnutrition, severe (HCC)   Frailty   Pressure injury of  skin   SLE (systemic lupus erythematosus related syndrome) (HCC)   * Atrial fibrillation with rapid ventricular response (HCC) Dilt drip weaned down, hr ok today at around 100, home amio re-started, will also re-start metoprolol which patient was previously taking but not more recently. No longer on apixaban   Fall at home with maxillary sinus fracture, initial encounter Frequent falls with injury (pelvic, radial, rib fracture, subarachnoid hemorrhage) Recurrent syncope, with prior history of orthostatic hypotension Frailty, protein calorie malnutrition, physical deconditioning Consider safety of living alone Will not pursue full syncopal workup due to frequent prior similar episodes full workup including EEGs Continuous cardiac monitoring PT eval  Right facial droop May be 2/2 nerve impingement from maxillary fracture, ct head negative for bleed, will check mri to further evaluate for CVA. Will order eye lubrication  History subdural and subarachnoid hematoma Last year. No signs bleeding on head ct - consider repeat CT if new neurologic symptoms  End-of-life care Enrolled in hospice a few months ago, has multiple family members helping out at home but home doesn't appear safe. Apparently hospice has discharged given family's decision to send to the hospital - [facility] consulted for assistance w/ disposition   Maxillary sinus fracture, closed, initial encounter (HCC) Pain control, ice packs Ice packs and pain control for current maxillary sinus fracture I spoke w/ Dr. Jenne Campus of ent today, he says nothing to do inpatient, consider outpatient f/u with [facility] if consistent w/ goals of care   Dementia, with history of inpatient delirium/encephalopathy [facility]) Delirium precautions Has had neurologic workups and  consultations with EEGs in the past for acute encephalopathy   Ckd 3a (HCC) Cr at baseline - monitor   Chronic diastolic CHF (congestive heart failure) (HCC) Clinically  euvolemic Last EF 10/2022 55 to 60%.   Chronic pain syndrome Long-term opiate analgesic use Continue judicious use of oxycodone, gabapentin, Flexeril Fall precautions   Protein-calorie malnutrition, severe (HCC) Dietary consult   Breast cancer with Hx of mastectomy, bilateral with reconstruction Noted     DVT prophylaxis: lovenox Code Status: full Family Communication: friend [place] updated @ bedside  Level of care: Progressive Status is: Inpatient Remains inpatient appropriate because: severity of illness    Consultants:  none  Procedures: none  Antimicrobials:  none    Subjective: Reports some right shoulder pain, no chest pain  Objective: Vitals:   09/27/23 0700 09/27/23 0800 09/27/23 0900 09/27/23 0932  BP: 101/71 94/60 97/63  97/63  Pulse: 96 95 97 99  Resp: 16 16 16 16   Temp: 98.8 F (37.1 C)   98 F (36.7 C)  TempSrc:      SpO2:    94%  Weight:      Height:        Intake/Output Summary (Last 24 hours) at 09/27/2023 1010 Last data filed at 09/27/2023 0534 Gross per 24 hour  Intake 265.56 ml  Output 400 ml  Net -134.44 ml   Filed Weights   09/26/23 1604  Weight: 53.9 kg    Examination:  General exam: Appears calm and comfortable  Respiratory system: Clear to auscultation. Respiratory effort normal. Cardiovascular system: S1 & S2 heard, tachycardic, irreg Gastrointestinal system: Abdomen is nondistended, soft and nontender. No organomegaly or masses felt.   Central nervous system: Alert and oriented to self, moving all 4 Extremities: Symmetric 5 x 5 power. Skin: No rashes, lesions or ulcers Psychiatry: calm, confused    Data Reviewed: I have personally reviewed following labs and imaging studies  CBC: Recent Labs  Lab 09/26/23 1620 09/27/23 0439  WBC 13.6* 10.0  NEUTROABS 12.1*  --   HGB 14.3 12.0  HCT 43.3 34.4*  MCV 92.5 88.4  PLT 293 266   Basic Metabolic Panel: Recent Labs  Lab 09/26/23 1620 09/27/23 0439  NA 133*  135  K 4.3 3.5  CL 97* 100  CO2 25 25  GLUCOSE 130* 103*  BUN 27* 24*  CREATININE 1.16* 1.09*  CALCIUM 9.2 8.5*   GFR: Estimated Creatinine Clearance: 36.8 mL/min (A) (by C-G formula based on SCr of 1.09 mg/dL (H)). Liver Function Tests: Recent Labs  Lab 09/26/23 1620  AST 36  ALT 20  ALKPHOS 387*  BILITOT 0.6  PROT 8.4*  ALBUMIN 3.9   No results for input(s): "LIPASE", "AMYLASE" in the last 168 hours. No results for input(s): "AMMONIA" in the last 168 hours. Coagulation Profile: No results for input(s): "INR", "PROTIME" in the last 168 hours. Cardiac Enzymes: Recent Labs  Lab 09/26/23 1620  CKTOTAL 153   BNP (last 3 results) No results for input(s): "PROBNP" in the last 8760 hours. HbA1C: No results for input(s): "HGBA1C" in the last 72 hours. CBG: No results for input(s): "GLUCAP" in the last 168 hours. Lipid Profile: No results for input(s): "CHOL", "HDL", "LDLCALC", "TRIG", "CHOLHDL", "LDLDIRECT" in the last 72 hours. Thyroid Function Tests: No results for input(s): "TSH", "T4TOTAL", "FREET4", "T3FREE", "THYROIDAB" in the last 72 hours. Anemia Panel: No results for input(s): "VITAMINB12", "FOLATE", "FERRITIN", "TIBC", "IRON", "RETICCTPCT" in the last 72 hours. Urine analysis:    Component Value Date/Time  COLORURINE YELLOW (A) 09/26/2023 1758   APPEARANCEUR CLEAR (A) 09/26/2023 1758   LABSPEC 1.011 09/26/2023 1758   PHURINE 6.0 09/26/2023 1758   GLUCOSEU NEGATIVE 09/26/2023 1758   HGBUR NEGATIVE 09/26/2023 1758   BILIRUBINUR NEGATIVE 09/26/2023 1758   BILIRUBINUR Negative 07/13/2023 1140   KETONESUR 5 (A) 09/26/2023 1758   PROTEINUR NEGATIVE 09/26/2023 1758   UROBILINOGEN 0.2 07/13/2023 1140   UROBILINOGEN 0.2 11/20/2013 0201   NITRITE POSITIVE (A) 09/26/2023 1758   LEUKOCYTESUR NEGATIVE 09/26/2023 1758   Sepsis Labs: @LABRCNTIP (procalcitonin:4,lacticidven:4)  )No results found for this or any previous visit (from the past 240 hour(s)).        Radiology Studies: DG Pelvis 1-2 Views  Result Date: 09/26/2023 CLINICAL DATA:  Fall EXAM: PELVIS - 1-2 VIEW COMPARISON:  None Available. FINDINGS: There is no evidence of pelvic fracture or diastasis. No pelvic bone lesions are seen. IMPRESSION: Negative. Electronically Signed   By: Charlett Nose M.D.   On: 09/26/2023 19:30   DG Shoulder Right  Result Date: 09/26/2023 CLINICAL DATA:  Fall, right arm pain EXAM: RIGHT SHOULDER - 2+ VIEW COMPARISON:  None Available. FINDINGS: No acute bony abnormality. Specifically, no fracture, subluxation, or dislocation. Old healed posterior right 7th and 8th rib fractures. No visible acute rib fracture. IMPRESSION: No acute bony abnormality. Electronically Signed   By: Charlett Nose M.D.   On: 09/26/2023 19:29   DG Chest Portable 1 View  Result Date: 09/26/2023 CLINICAL DATA:  Fall, left hip pain, right arm pain EXAM: PORTABLE CHEST 1 VIEW COMPARISON:  03/23/2023 FINDINGS: Heart and mediastinal contours are within normal limits. No focal opacities or effusions. No acute bony abnormality. Aortic atherosclerosis. IMPRESSION: No active cardiopulmonary disease. Electronically Signed   By: Charlett Nose M.D.   On: 09/26/2023 19:28   CT Cervical Spine Wo Contrast  Result Date: 09/26/2023 CLINICAL DATA:  Neck trauma.  Found unresponsive face down. EXAM: CT CERVICAL SPINE WITHOUT CONTRAST TECHNIQUE: Multidetector CT imaging of the cervical spine was performed without intravenous contrast. Multiplanar CT image reconstructions were also generated. RADIATION DOSE REDUCTION: This exam was performed according to the departmental dose-optimization program which includes automated exposure control, adjustment of the mA and/or kV according to patient size and/or use of iterative reconstruction technique. COMPARISON:  None Available. FINDINGS: Alignment: Normal. Skull base and vertebrae: No acute fracture. No primary bone lesion or focal pathologic process. Soft tissues  and spinal canal: No prevertebral fluid or swelling. No visible canal hematoma. Disc levels: Disc space narrowing and spurring. Mild bilateral degenerative facet disease. Upper chest: Biapical scarring.  Paraseptal emphysema. Other: None IMPRESSION: Degenerative disc and facet disease. No acute bony abnormality. Electronically Signed   By: Charlett Nose M.D.   On: 09/26/2023 19:27   CT Maxillofacial Wo Contrast  Result Date: 09/26/2023 CLINICAL DATA:  Found face down.  Facial trauma, blunt EXAM: CT MAXILLOFACIAL WITHOUT CONTRAST TECHNIQUE: Multidetector CT imaging of the maxillofacial structures was performed. Multiplanar CT image reconstructions were also generated. RADIATION DOSE REDUCTION: This exam was performed according to the departmental dose-optimization program which includes automated exposure control, adjustment of the mA and/or kV according to patient size and/or use of iterative reconstruction technique. COMPARISON:  None Available. FINDINGS: Osseous: There is a linear lucency through the anterior wall of the right maxillary sinus with frothy material layering in the right maxillary sinus. Findings concerning for nondisplaced anterior right maxillary wall fracture. No additional fracture. Zygomatic arches and mandible intact. Orbits: No evidence of orbital fracture.  Globes intact.  Sinuses: Mucosal thickening in the right maxillary sinus with layering frothy material in the right maxillary sinus and sphenoid sinus. Soft tissues: Soft tissue swelling over the right face, orbit and forehead. Limited intracranial: See head CT report IMPRESSION: Linear lucency in the anterior wall the right maxillary sinus with layering frothy material in the right maxillary and sphenoid sinuses. Findings concerning for nondisplaced fracture. No orbital fracture.  In Electronically Signed   By: Charlett Nose M.D.   On: 09/26/2023 19:26   CT HEAD WO CONTRAST ( )  Result Date: 09/26/2023 CLINICAL DATA:  Head  trauma, moderate-severe.  Found face down EXAM: CT HEAD WITHOUT CONTRAST TECHNIQUE: Contiguous axial images were obtained from the base of the skull through the vertex without intravenous contrast. RADIATION DOSE REDUCTION: This exam was performed according to the departmental dose-optimization program which includes automated exposure control, adjustment of the mA and/or kV according to patient size and/or use of iterative reconstruction technique. COMPARISON:  04/19/2023 FINDINGS: Brain: There is atrophy and chronic small vessel disease changes. No acute intracranial abnormality. Specifically, no hemorrhage, hydrocephalus, mass lesion, acute infarction, or significant intracranial injury. Vascular: No hyperdense vessel or unexpected calcification. Skull: No acute calvarial abnormality. Sinuses/Orbits: Soft tissue swelling over the right face and forehead. See further discussion on maxillofacial CT report Other: None IMPRESSION: Atrophy, chronic microvascular disease. No acute intracranial abnormality. Electronically Signed   By: Charlett Nose M.D.   On: 09/26/2023 19:23        Scheduled Meds:  amiodarone  100 mg Oral Daily   enoxaparin (LOVENOX) injection  40 mg Subcutaneous Q24H   traZODone  150 mg Oral QHS   Continuous Infusions:  diltiazem (CARDIZEM) infusion Stopped (09/27/23 0929)     LOS: 1 day     Silvano Bilis, MD [facility]   If 7PM-7AM, please contact night-coverage [email or web address] Password [email or web address] 09/27/2023, 10:10 AM

## 2023-09-28 ENCOUNTER — Encounter: Payer: Self-pay | Admitting: Internal Medicine

## 2023-09-28 DIAGNOSIS — Y92009 Unspecified place in unspecified non-institutional (private) residence as the place of occurrence of the external cause: Secondary | ICD-10-CM

## 2023-09-28 DIAGNOSIS — I5032 Chronic diastolic (congestive) heart failure: Secondary | ICD-10-CM | POA: Diagnosis not present

## 2023-09-28 DIAGNOSIS — I4891 Unspecified atrial fibrillation: Secondary | ICD-10-CM | POA: Diagnosis not present

## 2023-09-28 DIAGNOSIS — W19XXXA Unspecified fall, initial encounter: Secondary | ICD-10-CM | POA: Diagnosis not present

## 2023-09-28 LAB — CBC
HCT: 38.1 % (ref 36.0–46.0)
Hemoglobin: 12.6 g/dL (ref 12.0–15.0)
MCH: 30.4 pg (ref 26.0–34.0)
MCHC: 33.1 g/dL (ref 30.0–36.0)
MCV: 91.8 fL (ref 80.0–100.0)
Platelets: 265 10*3/uL (ref 150–400)
RBC: 4.15 MIL/uL (ref 3.87–5.11)
RDW: 13.6 % (ref 11.5–15.5)
WBC: 8.7 10*3/uL (ref 4.0–10.5)
nRBC: 0 % (ref 0.0–0.2)

## 2023-09-28 LAB — BASIC METABOLIC PANEL
Anion gap: 9 (ref 5–15)
BUN: 28 mg/dL — ABNORMAL HIGH (ref 8–23)
CO2: 27 mmol/L (ref 22–32)
Calcium: 8.7 mg/dL — ABNORMAL LOW (ref 8.9–10.3)
Chloride: 100 mmol/L (ref 98–111)
Creatinine, Ser: 1.17 mg/dL — ABNORMAL HIGH (ref 0.44–1.00)
GFR, Estimated: 48 mL/min — ABNORMAL LOW (ref >60)
Glucose, Bld: 105 mg/dL — ABNORMAL HIGH (ref 70–99)
Potassium: 3.7 mmol/L (ref 3.5–5.1)
Sodium: 136 mmol/L (ref 135–145)

## 2023-09-28 MED ORDER — METOPROLOL TARTRATE 25 MG PO TABS
25.0000 mg | ORAL_TABLET | Freq: Two times a day (BID) | ORAL | Status: DC
  Administered 2023-09-28 – 2023-09-30 (×5): 25 mg via ORAL
  Filled 2023-09-28 (×5): qty 1

## 2023-09-28 NOTE — Plan of Care (Signed)
  Problem: Education: Goal: Knowledge of condition and prescribed therapy will improve Outcome: Progressing   Problem: Cardiac: Goal: Will achieve and/or maintain adequate cardiac output Outcome: Progressing   Problem: Physical Regulation: Goal: Complications related to the disease process, condition or treatment will be avoided or minimized Outcome: Progressing   Problem: Education: Goal: Knowledge of General Education information will improve Description: Including pain rating scale, medication(s)/side effects and non-pharmacologic comfort measures Outcome: Progressing   Problem: Health Behavior/Discharge Planning: Goal: Ability to manage health-related needs will improve Outcome: Progressing   Problem: Clinical Measurements: Goal: Ability to maintain clinical measurements within normal limits will improve Outcome: Progressing Goal: Will remain free from infection Outcome: Progressing Goal: Diagnostic test results will improve Outcome: Progressing Goal: Respiratory complications will improve Outcome: Progressing Goal: Cardiovascular complication will be avoided Outcome: Progressing   Problem: Activity: Goal: Risk for activity intolerance will decrease Outcome: Progressing   Problem: Nutrition: Goal: Adequate nutrition will be maintained Outcome: Progressing   Problem: Coping: Goal: Level of anxiety will decrease Outcome: Progressing   Problem: Elimination: Goal: Will not experience complications related to bowel motility Outcome: Progressing Goal: Will not experience complications related to urinary retention Outcome: Progressing   Problem: Pain Managment: Goal: General experience of comfort will improve Outcome: Progressing   Problem: Safety: Goal: Ability to remain free from injury will improve Outcome: Progressing   Problem: Skin Integrity: Goal: Risk for impaired skin integrity will decrease Outcome: Progressing   Problem: Education: Goal:  Knowledge of disease or condition will improve Outcome: Progressing Goal: Understanding of medication regimen will improve Outcome: Progressing Goal: Individualized Educational Video(s) Outcome: Progressing   Problem: Activity: Goal: Ability to tolerate increased activity will improve Outcome: Progressing   Problem: Cardiac: Goal: Ability to achieve and maintain adequate cardiopulmonary perfusion will improve Outcome: Progressing   Problem: Health Behavior/Discharge Planning: Goal: Ability to safely manage health-related needs after discharge will improve Outcome: Progressing

## 2023-09-28 NOTE — Progress Notes (Addendum)
Progress Note   Patient: Donna Evans:[id number] DOB: 01-02-1946 DOA: 09/26/2023     2 DOS: the patient was seen and examined on 09/28/2023   Brief hospital course: Donna Evans is a 77 y.o. female with medical history significant for hypertension, CVA, depression with anxiety, migraine headache, right breast cancer s/p mastectomy, chronic pain syndrome, HFpEF, CKD stage IIIa, COPD, SLE, A-fib on amiodarone, no longer on Eliquis , history of falls, syncope and balance issues, dementia with prior in-hospital delirium , last hospitalized in April 2024 with a subdural hematoma and subarachnoid hemorrhage, recurrent syncope with negative EEG in April, who presents to the ED with a fall. Patient remained was getting to the door sometime the night prior but then fell.  She lost consciousness.  When she came around she was unable to get up.  Friends called earlier in the day and did not get her and when the checked on her at 3 PM they found her on the floor.   Principal Problem:   Atrial fibrillation with rapid ventricular response (HCC) Active Problems:   Fall at home with maxillary sinus fracture, initial encounter   Syncope   Frequent falls with injury (pelvic rib and radial fractures, subdural and subarachnoid hemorrhage)   AKI (acute kidney injury) (HCC)   Dementia, with history of inpatient delirium/encephalopathy (HCC)   Maxillary sinus fracture, closed, initial encounter (HCC)   Chronic diastolic CHF (congestive heart failure) (HCC)   Alkaline phosphatase elevation   COPD (chronic obstructive pulmonary disease) (HCC)   Chronic pain syndrome   Long term current use of opiate analgesic   Breast cancer with Hx of mastectomy, bilateral with reconstruction   Hemiplegia of dominant side (HCC)   Fibromyalgia syndrome   Generalized anxiety disorder   Stroke (HCC)   CKD (chronic kidney disease), stage III (HCC)   Protein-calorie malnutrition, severe (HCC)   Frailty   Pressure injury  of skin   SLE (systemic lupus erythematosus related syndrome) (HCC)   Assessment and Plan: *Chronic atrial fibrillation with rapid ventricular response (HCC) Patient was initially placed on diltiazem drip, heart rate better controlled, restart metoprolol, I will increase dose to 25 mg for better rate control. Patient is not currently on anticoagulation due to frequent falls.  Fall at home with maxillary sinus fracture, initial encounter Frequent falls with injury (pelvic, radial, rib fracture, subarachnoid hemorrhage) Recurrent syncope, with prior history of orthostatic hypotension Frailty, protein calorie malnutrition, physical deconditioning Patient currently lives at home by herself, not safe to return to home.  Discussed with TOC, will need some arrangement for 24 hours of care.  Maxillary sinus fracture, closed, initial encounter (HCC) Continue symptomatic treatment.  Dementia, with history of inpatient delirium/encephalopathy Donna Evans) Continue to follow.  AKI (acute kidney injury) (HCC) ruled out. Chronic kidney disease stage IIIa. Reviewed prior labs, her renal function at baseline.  Chronic diastolic CHF (congestive heart failure) (HCC) No exacerbation.  Chronic pain syndrome Long-term opiate analgesic use Continue judicious use of oxycodone, gabapentin, Flexeril and trazodone Fall precautions  Protein-calorie malnutrition, severe (HCC) Continue protein supplement.  Generalized anxiety disorder Continue trazodone  Breast cancer with Hx of mastectomy, bilateral with reconstruction No acute issues  Pressure wound POA Pressure Injury 09/26/23 Buttocks Stage 2 -  Partial thickness loss of dermis presenting as a shallow open injury with a red, pink wound bed without slough. (Active)  09/26/23 2315  Location: Buttocks  Location Orientation:   Staging: Stage 2 -  Partial thickness loss of dermis presenting  as a shallow open injury with a red, pink wound bed without  slough.  Wound Description (Comments):   Present on Admission: Yes         Subjective:  Patient doing well today, she does not have a complaint.  Physical Exam: Vitals:   09/28/23 0401 09/28/23 0655 09/28/23 0800 09/28/23 1135  BP: 122/76 108/79 104/71   Pulse: 88     Resp: 18 20 10    Temp: 98.1 F (36.7 C)  98 F (36.7 C) 98.5 F (36.9 C)  TempSrc:   Oral   SpO2: 96%     Weight:      Height:       General exam: Appears calm and comfortable, appears severely malnourished. Respiratory system: Clear to auscultation. Respiratory effort normal. Cardiovascular system: S1 & S2 heard, RRR. No JVD, murmurs, rubs, gallops or clicks. No pedal edema. Gastrointestinal system: Abdomen is nondistended, soft and nontender. No organomegaly or masses felt. Normal bowel sounds heard. Central nervous system: Alert and oriented. No focal neurological deficits. Extremities: Symmetric 5 x 5 power. Skin: No rashes, lesions or ulcers Psychiatry: Judgement and insight appear normal. Mood & affect appropriate.    Data Reviewed:  X-rays and the lab results reviewed.  Family Communication: None  Disposition: Status is: Inpatient Remains inpatient appropriate because: Unsafe discharge options. Currently, patient is medically stable for discharge.  But I do not have a safe discharge option.  TOC is a following.     Time spent: 35 minutes  Author: Marrion Coy, MD 09/28/2023 2:25 PM  For on call review [email or web address].

## 2023-09-28 NOTE — Hospital Course (Addendum)
Donna Evans is a 77 y.o. female with medical history significant for hypertension, CVA, depression with anxiety, migraine headache, right breast cancer s/p mastectomy, chronic pain syndrome, HFpEF, CKD stage IIIa, COPD, SLE, A-fib on amiodarone, no longer on Eliquis , history of falls, syncope and balance issues, dementia with prior in-hospital delirium , last hospitalized in April 2024 with a subdural hematoma and subarachnoid hemorrhage, recurrent syncope with negative EEG in April, who presents to the ED with a fall. Patient remained was getting to the door sometime the night prior but then fell.  She lost consciousness.  When she came around she was unable to get up.  Friends called earlier in the day and did not get her and when the checked on her at 3 PM they found her on the floor.  Patient was found to have atrial fibrillation with RVR, heart rate went up significantly to 140s with exertion.  She was treated with metoprolol and a lower dose diltiazem.  Heart rate much better, she was not taking anticoagulation chronically. Discussed with the family and POA, patient preferred to go home with hospice.

## 2023-09-28 NOTE — Progress Notes (Signed)
Physical Therapy Treatment Patient Details Name: Donna Evans MRN: [id number] DOB: Feb 08, 1946 Today's Date: 09/28/2023   History of Present Illness 77 y.o. female with medical history significant for hypertension, CVA, depression with anxiety, migraine headache, right breast cancer s/p mastectomy, chronic pain syndrome, HFpEF, CKD stage IIIa, COPD, SLE, A-fib on amiodarone, no longer on Eliquis , history of falls, syncope and balance issues, dementia with prior in-hospital delirium , last hospitalized in April 2024 with a subdural hematoma and subarachnoid hemorrhage, recurrent syncope with negative EEG in April, who presents to the ED with a fall. Noted for afib with RVR, maxillary sinus fx.    PT Comments  Pt was long sitting in bed upon arrival. She is alert and oriented x 3. Agrees to session and endorse feeling better today than yesterday. " I was numb yesterday and today I can feel more on my R arm/side." Pt agreeable to OOB activity. She was able to safely exit bed, stand to RW, an tolerate ambulation ~ 200 ft. Pt was encouraged to continue to use AD/RW at DC. Pt's peak HR 129bpm with RR elevating to upper 20/low 30s but quickly resolves with seated rest. Chartered loss adjuster discussed pt's progress with emergency contacts and discussed post acute care. Pt has had sitters/aides in the past and nephew is planning to re-hire for when pt returns home. Acute PT will continue to follow and progress per current POC.    If plan is discharge home, recommend the following: A little help with walking and/or transfers;A little help with bathing/dressing/bathroom;Assistance with cooking/housework;Supervision due to cognitive status;Assist for transportation;Help with stairs or ramp for entrance;Direct supervision/assist for medications management     Equipment Recommendations  None recommended by PT (per pt/ emergency contact, has all equipmnt needs met)       Precautions / Restrictions Precautions Precautions:  Fall Restrictions Weight Bearing Restrictions: No     Mobility  Bed Mobility Overal bed mobility: Modified Independent Bed Mobility: Supine to Sit, Sit to Supine  Supine to sit: Supervision Sit to supine: Supervision   Transfers Overall transfer level: Needs assistance Equipment used: Rolling walker (2 wheels) (uses rollator/RW at baseline) Transfers: Sit to/from Stand Sit to Stand: Supervision   Ambulation/Gait Ambulation/Gait assistance: Supervision Gait Distance (Feet): 200 Feet Assistive device: Rolling walker (2 wheels) Gait Pattern/deviations: Step-through pattern Gait velocity: decreased  General Gait Details: pt ambulated 200 ft with HR elvtion to peak 129. RR was elevated at times but recovers appropriately. " Im doing much better today than yesterday."     Balance Overall balance assessment: Needs assistance Sitting-balance support: No upper extremity supported, Feet supported Sitting balance-Leahy Scale: Good     Standing balance support: Bilateral upper extremity supported, During functional activity Standing balance-Leahy Scale: Good Standing balance comment: no LOB throughout all standing activity. pt even ambulated ~ 15 without AD however author encouraged use at all times at DC     Cognition Arousal: Alert Behavior During Therapy: [facility] for tasks assessed/performed Overall Cognitive Status: Within Functional Limits for tasks assessed    General Comments: Pt is A and O x 3. Agreeable to session and pleasant throughout               Pertinent Vitals/Pain Pain Assessment Pain Assessment: 0-10 Pain Score: 3  Pain Location: R shoulder/hand Pain Descriptors / Indicators: Burning, Numbness Pain Intervention(s): Limited activity within patient's tolerance, Monitored during session, Premedicated before session, Repositioned     PT Goals (current goals can now be found in the care  plan section) Acute Rehab PT Goals Patient Stated Goal: get better and  then go home Progress towards PT goals: Progressing toward goals    Frequency    Min 1X/week           Co-evaluation     PT goals addressed during session: Mobility/safety with mobility        AM-PAC PT "6 Clicks" Mobility   Outcome Measure  Help needed turning from your back to your side while in a flat bed without using bedrails?: None Help needed moving from lying on your back to sitting on the side of a flat bed without using bedrails?: None Help needed moving to and from a bed to a chair (including a wheelchair)?: A Little Help needed standing up from a chair using your arms (e.g., wheelchair or bedside chair)?: A Little Help needed to walk in hospital room?: A Little Help needed climbing 3-5 steps with a railing? : A Little 6 Click Score: 20    End of Session   Activity Tolerance: Patient tolerated treatment well Patient left: in bed;with call bell/phone within reach;with bed alarm set Nurse Communication: Mobility status PT Visit Diagnosis: Other abnormalities of gait and mobility (R26.89);Difficulty in walking, not elsewhere classified (R26.2);Muscle weakness (generalized) (M62.81)     Time: 1610-9604 PT Time Calculation (min) (ACUTE ONLY): 15 min  Charges:    $Gait Training: 8-22 mins PT General Charges $$ ACUTE PT VISIT: 1 Visit                    Jetta Lout PTA 09/28/23, 4:49 PM

## 2023-09-28 NOTE — Plan of Care (Signed)
  Problem: Activity: Goal: Risk for activity intolerance will decrease Outcome: Progressing   

## 2023-09-28 NOTE — TOC Progression Note (Addendum)
Transition of Care [facility]) - Progression Note    Patient Details  Name: Donna Evans MRN: [id number] Date of Birth: 1946-08-04  Transition of Care [facility]) CM/SW Contact  Darolyn Rua, [place] Phone Number: 09/28/2023, 11:20 AM  Clinical Narrative:     CSW spoke with patient nephew and POA [name] regarding plan for [facility] with hospice, they reported since patient does not have medicaid would be private pay $290/day.   [name] patient's nephew would like to try patient at [facility] for snf to see if she can improve enough to go home, per Elmarie Shiley at [place] since hospice services were revoked, patient's insurance plan has went to regular medicare not managed, and patient can admit to [facility] under medicare after 3 night stay, plan for dc Friday to [facility] if medically cleared, MD made aware.    Nephew open to resources for private care agencies, CSW has left multiple pamplets and brochures in patient's room for nephew to review some resources, encouraged him to look online as well for additional agencies. Nephew aware [facility] is not affiliated with private care agencies.    Expected Discharge Plan:  (LTC with hospice) Barriers to Discharge: Continued Medical Work up  Expected Discharge Plan and Services       Living arrangements for the past 2 months: Single Family Home                                       Social Determinants of Health (SDOH) Interventions SDOH Screenings   Food Insecurity: No Food Insecurity (05/22/2023)  Housing: Low Risk  (09/27/2023)  Transportation Needs: No Transportation Needs (09/27/2023)  Utilities: Patient Unable To Answer (09/27/2023)  Depression (PHQ2-9): Low Risk  (07/13/2023)  Recent Concern: Depression (PHQ2-9) - High Risk (04/25/2023)  Financial Resource Strain: Low Risk  (05/22/2023)  Physical Activity: Unknown (05/22/2023)  Social Connections: Socially Isolated (05/22/2023)  Stress: No Stress Concern Present (05/22/2023)   Tobacco Use: Medium Risk (07/13/2023)    Readmission Risk Interventions    03/24/2023    2:47 PM  Readmission Risk Prevention Plan  Transportation Screening Complete  Medication Review (RN Care Manager) Complete  PCP or Specialist appointment within 3-5 days of discharge Complete  HRI or Home Care Consult Complete  SW Recovery Care/Counseling Consult Complete  Palliative Care Screening Not Applicable  Skilled Nursing Facility Complete

## 2023-09-29 DIAGNOSIS — I5032 Chronic diastolic (congestive) heart failure: Secondary | ICD-10-CM | POA: Diagnosis not present

## 2023-09-29 DIAGNOSIS — R296 Repeated falls: Secondary | ICD-10-CM | POA: Diagnosis not present

## 2023-09-29 DIAGNOSIS — I4891 Unspecified atrial fibrillation: Secondary | ICD-10-CM | POA: Diagnosis not present

## 2023-09-29 LAB — URINE CULTURE: Culture: >=100000 — AB

## 2023-09-29 MED ORDER — LACTULOSE 10 GM/15ML PO SOLN
20.0000 g | Freq: Once | ORAL | Status: AC
  Administered 2023-09-29: 20 g via ORAL
  Filled 2023-09-29: qty 30

## 2023-09-29 MED ORDER — DILTIAZEM HCL ER COATED BEADS 120 MG PO CP24
120.0000 mg | ORAL_CAPSULE | Freq: Every day | ORAL | Status: DC
  Administered 2023-09-30: 120 mg via ORAL
  Filled 2023-09-29: qty 1

## 2023-09-29 MED ORDER — ALUM & MAG HYDROXIDE-SIMETH 200-200-20 MG/5ML PO SUSP
30.0000 mL | ORAL | Status: DC | PRN
  Administered 2023-09-29: 30 mL via ORAL
  Filled 2023-09-29: qty 30

## 2023-09-29 MED ORDER — DILTIAZEM HCL ER COATED BEADS 180 MG PO CP24
180.0000 mg | ORAL_CAPSULE | Freq: Every day | ORAL | Status: DC
  Administered 2023-09-29: 180 mg via ORAL
  Filled 2023-09-29: qty 1

## 2023-09-29 NOTE — Progress Notes (Signed)
Progress Note   Patient: Donna Evans KVQ:[id number] DOB: [birth year] DOA: 09/26/2023     3 DOS: the patient was seen and examined on 09/29/2023   Brief hospital course: Donna Evans is a 77 y.o. female with medical history significant for hypertension, CVA, depression with anxiety, migraine headache, right breast cancer s/p mastectomy, chronic pain syndrome, HFpEF, CKD stage IIIa, COPD, SLE, A-fib on amiodarone, no longer on Eliquis , history of falls, syncope and balance issues, dementia with prior in-hospital delirium , last hospitalized in April 2024 with a subdural hematoma and subarachnoid hemorrhage, recurrent syncope with negative EEG in April, who presents to the ED with a fall. Patient remained was getting to the door sometime the night prior but then fell.  She lost consciousness.  When she came around she was unable to get up.  Friends called earlier in the day and did not get her and when the checked on her at 3 PM they found her on the floor.   Principal Problem:   Atrial fibrillation with rapid ventricular response (HCC) Active Problems:   Fall at home with maxillary sinus fracture, initial encounter   Syncope   Frequent falls with injury (pelvic rib and radial fractures, subdural and subarachnoid hemorrhage)   AKI (acute kidney injury) (HCC)   Dementia, with history of inpatient delirium/encephalopathy (HCC)   Maxillary sinus fracture, closed, initial encounter (HCC)   Chronic diastolic CHF (congestive heart failure) (HCC)   Alkaline phosphatase elevation   COPD (chronic obstructive pulmonary disease) (HCC)   Chronic pain syndrome   Long term current use of opiate analgesic   Breast cancer with Hx of mastectomy, bilateral with reconstruction   Hemiplegia of dominant side (HCC)   Fibromyalgia syndrome   Generalized anxiety disorder   Stroke (HCC)   CKD (chronic kidney disease), stage III (HCC)   Protein-calorie malnutrition, severe (HCC)   Frailty   Pressure injury  of skin   SLE (systemic lupus erythematosus related syndrome) (HCC)   Assessment and Plan: *Chronic atrial fibrillation with rapid ventricular response (HCC) Patient was initially placed on diltiazem drip, metoprolol dose was increased to 25 mg twice a day. However, patient still have significant tachycardia with heart rate went up to 120-140 while standing.  I have added diltiazem 180 mg daily for better heart rate control.   Fall at home with maxillary sinus fracture, initial encounter Frequent falls with injury (pelvic, radial, rib fracture, subarachnoid hemorrhage) Recurrent syncope, with prior history of orthostatic hypotension Frailty, protein calorie malnutrition, physical deconditioning Patient had recurrent syncope in the past, last echocardiogram was performed 6 months ago, showed normal ejection fraction without valvular abnormality. Currently telemetry monitor for the last 2 days without ventricular arrhythmia. Currently we had an increased the dose of metoprolol and diltiazem due to persistent tachycardia.  Will continue adjust dose to minimize ill effect on blood pressure while maintaining heart rate.   Maxillary sinus fracture, closed, initial encounter (HCC) Continue symptomatic treatment.   Dementia, with history of inpatient delirium/encephalopathy [facility]) Continue to follow.   AKI (acute kidney injury) (HCC) ruled out. Chronic kidney disease stage IIIa. Reviewed prior labs, her renal function at baseline.   Chronic diastolic CHF (congestive heart failure) (HCC) No exacerbation.   Chronic pain syndrome Long-term opiate analgesic use Continue judicious use of oxycodone, gabapentin, Flexeril and trazodone Fall precautions   Protein-calorie malnutrition, severe (HCC) Continue protein supplement.   Generalized anxiety disorder Continue trazodone   Breast cancer with Hx of mastectomy, bilateral with reconstruction  No acute issues   Pressure wound POA Pressure  Injury 09/26/23 Buttocks Stage 2 -  Partial thickness loss of dermis presenting as a shallow open injury with a red, pink wound bed without slough. (Active)  09/26/23 2315  Location: Buttocks  Location Orientation:   Staging: Stage 2 -  Partial thickness loss of dermis presenting as a shallow open injury with a red, pink wound bed without slough.  Wound Description (Comments):   Present on Admission: Yes             Subjective:  Patient still constipated, no bowel movement for the last 4 days.  No nausea vomiting. Denies any short of breath.  Physical Exam: Vitals:   09/28/23 2142 09/28/23 2255 09/29/23 0432 09/29/23 0912  BP: 118/74 134/79 131/87 110/73  Pulse: (!) 106 94 90 (!) 106  Resp:  17 15 20   Temp:  98 F (36.7 C) 97.8 F (36.6 C) 98.4 F (36.9 C)  TempSrc:  Oral Oral Oral  SpO2:  95% 97% 96%  Weight:      Height:       General exam: Appears calm and comfortable, appears severely malnourished. Respiratory system: Clear to auscultation. Respiratory effort normal. Cardiovascular system: Irregularly irregular tachycardic.Marland Kitchen No JVD, murmurs, rubs, gallops or clicks. No pedal edema. Gastrointestinal system: Abdomen is nondistended, soft and nontender. No organomegaly or masses felt. Normal bowel sounds heard. Central nervous system: Alert and oriented. No focal neurological deficits. Extremities: Symmetric 5 x 5 power. Skin: No rashes, lesions or ulcers Psychiatry: Judgement and insight appear normal. Mood & affect appropriate.    Data Reviewed:  Lab results reviewed.  Family Communication: Nephew updated  Disposition: Status is: Inpatient Remains inpatient appropriate because: Severity of disease,     Time spent: 35 minutes  Author: Marrion Coy, MD 09/29/2023 10:30 AM  For on call review [email or web address].

## 2023-09-29 NOTE — Care Management Important Message (Signed)
Important Message  Patient Details  Name: Donna Evans MRN: [id number] Date of Birth: May 29, 1946   Important Message Given:  N/A - LOS <3 / Initial given by admissions     Olegario Messier A Vonte Rossin 09/29/2023, 9:45 AM

## 2023-09-29 NOTE — Progress Notes (Signed)
Physical Therapy Treatment Patient Details Name: Donna Evans MRN: [id number] DOB: [birth year] Today's Date: 09/29/2023   History of Present Illness 77 y.o. female with medical history significant for hypertension, CVA, depression with anxiety, migraine headache, right breast cancer s/p mastectomy, chronic pain syndrome, HFpEF, CKD stage IIIa, COPD, SLE, A-fib on amiodarone, no longer on Eliquis , history of falls, syncope and balance issues, dementia with prior in-hospital delirium , last hospitalized in April 2024 with a subdural hematoma and subarachnoid hemorrhage, recurrent syncope with negative EEG in April, who presents to the ED with a fall. Noted for afib with RVR, maxillary sinus fx.    PT Comments  Pt was asleep in long sitting upon arrival. HR between 112-120 at rest however quickly increased to > 120 with just sitting EOB. Pt eagerly wanting to get OOB to [facility]. She required supervision only to exit bed, stand, and take a few steps to [facility]. She did successfully urinate but has HR elevation even in sitting. MD arrived during. HR peaked at 158 bpm while sitting on BSC. Elected just to return pt to bed. RN aware. Meds ordered to assist with HR control. Author will return later if/when pt's HR improves.    If plan is discharge home, recommend the following: A little help with walking and/or transfers;A little help with bathing/dressing/bathroom;Assistance with cooking/housework;Supervision due to cognitive status;Assist for transportation;Help with stairs or ramp for entrance;Direct supervision/assist for medications management     Equipment Recommendations  None recommended by PT       Precautions / Restrictions Precautions Precautions: Fall Precaution Comments: watch HR/RR Restrictions Weight Bearing Restrictions: No     Mobility  Bed Mobility Overal bed mobility: Needs Assistance Bed Mobility: Supine to Sit, Sit to Supine  Supine to sit: Supervision Sit to supine: Supervision      Transfers Overall transfer level: Needs assistance Equipment used: Rolling walker (2 wheels) Transfers: Sit to/from Stand, Bed to chair/wheelchair/BSC Sit to Stand: Supervision Stand pivot transfers: Supervision  General transfer comment: pt's HR uncontrolled. pt requested to get on/off BSC to urinate. HR hits 156 in sitting. MD inroom and aware. med ordered. no physical assistance to exit bed stand and pivot    Ambulation/Gait  General Gait Details: elected to hold off on ambulation due to HR concerns    Balance Overall balance assessment: Needs assistance Sitting-balance support: Feet supported Sitting balance-Leahy Scale: Good     Standing balance support: Bilateral upper extremity supported, During functional activity Standing balance-Leahy Scale: Good       Cognition Arousal: Alert Behavior During Therapy: WFL for tasks assessed/performed Overall Cognitive Status: Within Functional Limits for tasks assessed    General Comments: Pt is A and O x 3. Agreeable to session and pleasant throughout               Pertinent Vitals/Pain Pain Assessment Pain Assessment: 0-10 Pain Score: 4  Pain Location: "all over." Pain Descriptors / Indicators: Discomfort Pain Intervention(s): Monitored during session, Limited activity within patient's tolerance, Premedicated before session, Repositioned     PT Goals (current goals can now be found in the care plan section) Acute Rehab PT Goals Patient Stated Goal: get safe and Progress towards PT goals: Progressing toward goals    Frequency    Min 1X/week           Co-evaluation     PT goals addressed during session: Mobility/safety with mobility        AM-PAC PT "6 Clicks" Mobility   Outcome  Measure  Help needed turning from your back to your side while in a flat bed without using bedrails?: None Help needed moving from lying on your back to sitting on the side of a flat bed without using bedrails?: A  Little Help needed moving to and from a bed to a chair (including a wheelchair)?: A Little Help needed standing up from a chair using your arms (e.g., wheelchair or bedside chair)?: A Little Help needed to walk in hospital room?: A Little Help needed climbing 3-5 steps with a railing? : A Little 6 Click Score: 19    End of Session   Activity Tolerance: Patient tolerated treatment well Patient left: in bed;with call bell/phone within reach;with bed alarm set Nurse Communication: Mobility status PT Visit Diagnosis: Other abnormalities of gait and mobility (R26.89);Difficulty in walking, not elsewhere classified (R26.2);Muscle weakness (generalized) (M62.81)     Time: 4010-2725 PT Time Calculation (min) (ACUTE ONLY): 13 min  Charges:    $Therapeutic Activity: 8-22 mins PT General Charges $$ ACUTE PT VISIT: 1 Visit                     Jetta Lout PTA 09/29/23, 9:52 AM

## 2023-09-29 NOTE — TOC Progression Note (Signed)
Transition of Care [facility]) - Progression Note    Patient Details  Name: Donna Evans MRN: [id number] Date of Birth: 1946/05/03  Transition of Care [facility]) CM/SW Contact  Darolyn Rua, [place] Phone Number: 09/29/2023, 9:27 AM  Clinical Narrative:     CSW discussed with patient's nephew and POA that patient did better with PT yesterday walking 200 ft, PT recommends home from hospital at dc with additional private care services for support. Nephew reports he will visit patient after lunch today to see how she does and determine if she will go home vs [facility] tomorrow.     Expected Discharge Plan:  (LTC with hospice) Barriers to Discharge: Continued Medical Work up  Expected Discharge Plan and Services       Living arrangements for the past 2 months: Single Family Home                                       Social Determinants of Health (SDOH) Interventions SDOH Screenings   Food Insecurity: No Food Insecurity (05/22/2023)  Housing: Low Risk  (09/27/2023)  Transportation Needs: No Transportation Needs (09/27/2023)  Utilities: Patient Unable To Answer (09/27/2023)  Depression (PHQ2-9): Low Risk  (07/13/2023)  Recent Concern: Depression (PHQ2-9) - High Risk (04/25/2023)  Financial Resource Strain: Low Risk  (05/22/2023)  Physical Activity: Unknown (05/22/2023)  Social Connections: Socially Isolated (05/22/2023)  Stress: No Stress Concern Present (05/22/2023)  Tobacco Use: Medium Risk (07/13/2023)    Readmission Risk Interventions    03/24/2023    2:47 PM  Readmission Risk Prevention Plan  Transportation Screening Complete  Medication Review (RN Care Manager) Complete  PCP or Specialist appointment within 3-5 days of discharge Complete  HRI or Home Care Consult Complete  SW Recovery Care/Counseling Consult Complete  Palliative Care Screening Not Applicable  Skilled Nursing Facility Complete

## 2023-09-29 NOTE — Plan of Care (Signed)
  Problem: Education: Goal: Knowledge of condition and prescribed therapy will improve Outcome: Progressing   Problem: Cardiac: Goal: Will achieve and/or maintain adequate cardiac output Outcome: Progressing   Problem: Physical Regulation: Goal: Complications related to the disease process, condition or treatment will be avoided or minimized Outcome: Progressing   Problem: Education: Goal: Knowledge of General Education information will improve Description: Including pain rating scale, medication(s)/side effects and non-pharmacologic comfort measures Outcome: Progressing   Problem: Health Behavior/Discharge Planning: Goal: Ability to manage health-related needs will improve Outcome: Progressing   Problem: Clinical Measurements: Goal: Ability to maintain clinical measurements within normal limits will improve Outcome: Progressing Goal: Will remain free from infection Outcome: Progressing Goal: Diagnostic test results will improve Outcome: Progressing Goal: Respiratory complications will improve Outcome: Progressing Goal: Cardiovascular complication will be avoided Outcome: Progressing   Problem: Activity: Goal: Risk for activity intolerance will decrease Outcome: Progressing   Problem: Nutrition: Goal: Adequate nutrition will be maintained Outcome: Progressing   Problem: Coping: Goal: Level of anxiety will decrease Outcome: Progressing   Problem: Elimination: Goal: Will not experience complications related to bowel motility Outcome: Progressing Goal: Will not experience complications related to urinary retention Outcome: Progressing   Problem: Pain Managment: Goal: General experience of comfort will improve Outcome: Progressing   Problem: Safety: Goal: Ability to remain free from injury will improve Outcome: Progressing   Problem: Skin Integrity: Goal: Risk for impaired skin integrity will decrease Outcome: Progressing   Problem: Education: Goal:  Knowledge of disease or condition will improve Outcome: Progressing Goal: Understanding of medication regimen will improve Outcome: Progressing Goal: Individualized Educational Video(s) Outcome: Progressing   Problem: Activity: Goal: Ability to tolerate increased activity will improve Outcome: Progressing   Problem: Cardiac: Goal: Ability to achieve and maintain adequate cardiopulmonary perfusion will improve Outcome: Progressing   Problem: Health Behavior/Discharge Planning: Goal: Ability to safely manage health-related needs after discharge will improve Outcome: Progressing   Problem: Education: Goal: Understanding of post-operative needs will improve Outcome: Progressing Goal: Individualized Educational Video(s) Outcome: Progressing   Problem: Clinical Measurements: Goal: Postoperative complications will be avoided or minimized Outcome: Progressing   Problem: Respiratory: Goal: Will regain and/or maintain adequate ventilation Outcome: Progressing

## 2023-09-29 NOTE — Progress Notes (Signed)
Occupational Therapy Treatment Patient Details Name: Donna Evans MRN: [id number] DOB: 02-Mar-1946 Today's Date: 09/29/2023   History of present illness 77 y.o. female with medical history significant for hypertension, CVA, depression with anxiety, migraine headache, right breast cancer s/p mastectomy, chronic pain syndrome, HFpEF, CKD stage IIIa, COPD, SLE, A-fib on amiodarone, no longer on Eliquis , history of falls, syncope and balance issues, dementia with prior in-hospital delirium , last hospitalized in April 2024 with a subdural hematoma and subarachnoid hemorrhage, recurrent syncope with negative EEG in April, who presents to the ED with a fall. Noted for afib with RVR, maxillary sinus fx.   OT comments  Pt is supine in bed on arrival. Pleasant and agreeable to OT session. She reports pain at 8/10 in her back following bathing with nursing sitting on BSC and mobility with OT during session. RN provided pain meds during session and pt not showing facial grimacing, etc during session. She performed bed mobility and STS with SUP. Agreeable to short distance mobility in the room for ~25-30 feet to bathroom, then door and back to bed with highest HR reading of 124. Nurse made aware. Pt reports she needed Mod A for bathing her LB. RUE ROM reassessed and remains with very little active shoulder flexion, but PROM WFL. Pt reports she is feeling stronger each day, but at this time would not feel comfortable to return home alone.  Pt returned to bed with all needs in place to eat her lunch tray. She will cont to require skilled acute OT services to maximize her safety and IND to return to PLOF.       If plan is discharge home, recommend the following:      Equipment Recommendations  BSC/3in1    Recommendations for Other Services      Precautions / Restrictions Precautions Precautions: Fall Precaution Comments: watch HR/RR Restrictions Weight Bearing Restrictions: No       Mobility Bed  Mobility Overal bed mobility: Needs Assistance Bed Mobility: Supine to Sit, Sit to Supine     Supine to sit: Supervision Sit to supine: Supervision        Transfers Overall transfer level: Needs assistance Equipment used: Rolling walker (2 wheels) Transfers: Sit to/from Stand Sit to Stand: Supervision           General transfer comment: HR improved with RR of 87 and highest reading of 124 with ~25-30 feet of mobility using RW to bathroom and door and back to bed as pt lunch tray arrived and visitor came.     Balance Overall balance assessment: Needs assistance Sitting-balance support: Feet supported Sitting balance-Leahy Scale: Good     Standing balance support: Bilateral upper extremity supported, During functional activity Standing balance-Leahy Scale: Good                             ADL either performed or assessed with clinical judgement   ADL Overall ADL's : Needs assistance/impaired                                       General ADL Comments: nurse present and reports assisted pt with bathing seated on BSC in bathroom with pt requiring Min to Mod A for backside; able to bathe her front side with SBA/CGA    Extremity/Trunk Assessment Upper Extremity Assessment Upper Extremity Assessment: RUE deficits/detail RUE Deficits /  Details: remains with limited shoulder AROM less than 45 degrees flexion   Lower Extremity Assessment Lower Extremity Assessment: Generalized weakness        Vision       Perception     Praxis      Cognition Arousal: Alert Behavior During Therapy: WFL for tasks assessed/performed Overall Cognitive Status: Within Functional Limits for tasks assessed                                 General Comments: Pt is A and O x 3. Agreeable to session and pleasant throughout        Exercises General Exercises - Upper Extremity Shoulder Flexion: PROM, Right, 5 reps, Seated Other Exercises Other  Exercises: Educated pt on lowering necessary items to lower cabinets/drawers for easier access upon return home d/t new RUE deficits, as well as to conserve energy and maximize safety.    Shoulder Instructions       General Comments HR monitored and much improved since this AM    Pertinent Vitals/ Pain       Pain Assessment Pain Assessment: 0-10 Pain Score: 8  Pain Location: back Pain Descriptors / Indicators: Discomfort Pain Intervention(s): Monitored during session, RN gave pain meds during session  Home Living                                          Prior Functioning/Environment              Frequency  Min 1X/week        Progress Toward Goals  OT Goals(current goals can now be found in the care plan section)  Progress towards OT goals: Progressing toward goals  Acute Rehab OT Goals Patient Stated Goal: return to PLOF OT Goal Formulation: With patient Time For Goal Achievement: 10/11/23 Potential to Achieve Goals: Fair  Plan      Co-evaluation                 AM-PAC OT "6 Clicks" Daily Activity     Outcome Measure   Help from another person eating meals?: None Help from another person taking care of personal grooming?: None Help from another person toileting, which includes using toliet, bedpan, or urinal?: A Little Help from another person bathing (including washing, rinsing, drying)?: A Lot Help from another person to put on and taking off regular upper body clothing?: A Little Help from another person to put on and taking off regular lower body clothing?: A Little 6 Click Score: 19    End of Session Equipment Utilized During Treatment: Rolling walker (2 wheels);Gait belt  OT Visit Diagnosis: Other abnormalities of gait and mobility (R26.89);Muscle weakness (generalized) (M62.81)   Activity Tolerance Patient tolerated treatment well   Patient Left in bed;with call bell/phone within reach;with family/visitor present    Nurse Communication Mobility status        Time: 1610-9604 OT Time Calculation (min): 27 min  Charges: OT General Charges $OT Visit: 1 Visit OT Treatments $Therapeutic Activity: 23-37 mins  Jorell Agne, OTR/L  09/29/23, 2:04 PM  Jamesha Ellsworth E Rosabell Geyer 09/29/2023, 2:01 PM

## 2023-09-30 DIAGNOSIS — I4891 Unspecified atrial fibrillation: Secondary | ICD-10-CM | POA: Diagnosis not present

## 2023-09-30 DIAGNOSIS — G894 Chronic pain syndrome: Secondary | ICD-10-CM | POA: Diagnosis not present

## 2023-09-30 DIAGNOSIS — I5032 Chronic diastolic (congestive) heart failure: Secondary | ICD-10-CM | POA: Diagnosis not present

## 2023-09-30 MED ORDER — DILTIAZEM HCL ER COATED BEADS 120 MG PO CP24: 120.0000 mg | ORAL_CAPSULE | Freq: Every day | ORAL | 0 refills | Status: DC

## 2023-09-30 MED ORDER — SENNOSIDES-DOCUSATE SODIUM 8.6-50 MG PO TABS: 2.0000 | ORAL_TABLET | Freq: Two times a day (BID) | ORAL | 0 refills | Status: DC

## 2023-09-30 MED ORDER — AMIODARONE HCL 100 MG PO TABS: 100.0000 mg | ORAL_TABLET | Freq: Every day | ORAL | Status: DC

## 2023-09-30 MED ORDER — METOPROLOL TARTRATE 25 MG PO TABS: 12.5000 mg | ORAL_TABLET | Freq: Two times a day (BID) | ORAL | Status: DC

## 2023-09-30 MED ORDER — LACTULOSE 10 GM/15ML PO SOLN
20.0000 g | Freq: Once | ORAL | Status: AC
  Administered 2023-09-30: 20 g via ORAL
  Filled 2023-09-30: qty 30

## 2023-09-30 MED ORDER — OXYCODONE HCL 5 MG PO TABS: 5.0000 mg | ORAL_TABLET | ORAL | 0 refills | Status: DC | PRN

## 2023-09-30 MED ORDER — SENNOSIDES-DOCUSATE SODIUM 8.6-50 MG PO TABS
2.0000 | ORAL_TABLET | Freq: Two times a day (BID) | ORAL | Status: DC
  Administered 2023-09-30: 2 via ORAL
  Filled 2023-09-30: qty 2

## 2023-09-30 NOTE — Discharge Summary (Addendum)
Physician Discharge Summary   Patient: Donna Evans MRN: [id number] DOB: Jul 09, 1946  Admit date:     09/26/2023  Discharge date: 09/30/23  Discharge Physician: Marrion Coy   PCP: No primary care provider on file.   Recommendations at discharge:   Follow-up with hospice as outpatient. May need to adjust dose of beta-blocker and diltiazem based on blood pressure, heart rate and symptoms.  Discharge Diagnoses: Principal Problem:   Atrial fibrillation with rapid ventricular response (HCC) Active Problems:   Fall at home with maxillary sinus fracture, initial encounter   Syncope   Frequent falls with injury (pelvic rib and radial fractures, subdural and subarachnoid hemorrhage)   AKI (acute kidney injury) (HCC)   Dementia, with history of inpatient delirium/encephalopathy (HCC)   Maxillary sinus fracture, closed, initial encounter (HCC)   Chronic diastolic CHF (congestive heart failure) (HCC)   Alkaline phosphatase elevation   COPD (chronic obstructive pulmonary disease) (HCC)   Chronic pain syndrome   Long term current use of opiate analgesic   Breast cancer with Hx of mastectomy, bilateral with reconstruction   Hemiplegia of dominant side (HCC)   Fibromyalgia syndrome   Generalized anxiety disorder   Stroke (HCC)   CKD (chronic kidney disease), stage III (HCC)   Protein-calorie malnutrition, severe (HCC)   Frailty   Pressure injury of skin   SLE (systemic lupus erythematosus related syndrome) (HCC)  Resolved Problems:   * No resolved hospital problems. [facility] Course: Donna Evans is a 77 y.o. female with medical history significant for hypertension, CVA, depression with anxiety, migraine headache, right breast cancer s/p mastectomy, chronic pain syndrome, HFpEF, CKD stage IIIa, COPD, SLE, A-fib on amiodarone, no longer on Eliquis , history of falls, syncope and balance issues, dementia with prior in-hospital delirium , last hospitalized in April 2024 with a subdural  hematoma and subarachnoid hemorrhage, recurrent syncope with negative EEG in April, who presents to the ED with a fall. Patient remained was getting to the door sometime the night prior but then fell.  She lost consciousness.  When she came around she was unable to get up.  Friends called earlier in the day and did not get her and when the checked on her at 3 PM they found her on the floor.  Patient was found to have atrial fibrillation with RVR, heart rate went up significantly to 140s with exertion.  She was treated with metoprolol and a lower dose diltiazem.  Heart rate much better, she was not taking anticoagulation chronically. Patient still has some weakness, especially right arm weakness.  Family prefer patient to go to rehab short-term and return home afterwards.  Assessment and Plan: *Chronic atrial fibrillation with rapid ventricular response (HCC) Patient was initially placed on diltiazem drip, metoprolol dose was increased to 25 mg twice a day. However, patient still have significant tachycardia with heart rate went up to 120-140 while standing on 10/17.  I added diltiazem 180 mg daily for better heart rate control. Much better controlled today, she did not have signal tachycardia with physical therapy.  Blood pressure still stable.  At this point, I have reduced metoprolol to 12.5 mg twice a day, reduce diltiazem to 120 mg daily.    Fall at home with maxillary sinus fracture, initial encounter Frequent falls with injury (pelvic, radial, rib fracture, subarachnoid hemorrhage) Recurrent syncope, with prior history of orthostatic hypotension Frailty, protein calorie malnutrition, physical deconditioning Patient had recurrent syncope in the past, last echocardiogram was performed 6 months ago,  showed normal ejection fraction without valvular abnormality. Currently telemetry monitor for the last 3 days without ventricular arrhythmia. Blood pressure and heart rate much better controlled.    Maxillary sinus fracture, closed, initial encounter (HCC) Continue symptomatic treatment.   Dementia, with history of inpatient delirium/encephalopathy [facility]) Follow-up with hospice.   AKI (acute kidney injury) (HCC) ruled out. Chronic kidney disease stage IIIa. Reviewed prior labs, her renal function at baseline.   Chronic diastolic CHF (congestive heart failure) (HCC) No exacerbation.   Chronic pain syndrome Long-term opiate analgesic use Resumed home treatment.   Protein-calorie malnutrition, severe (HCC) Encourage p.o. intake.   Generalized anxiety disorder Continue trazodone   Breast cancer with Hx of mastectomy, bilateral with reconstruction No acute issues   Pressure wound POA.  Follow-up with hospice. Pressure Injury 09/26/23 Buttocks Stage 2 -  Partial thickness loss of dermis presenting as a shallow open injury with a red, pink wound bed without slough. (Active)  09/26/23 2315  Location: Buttocks  Location Orientation:   Staging: Stage 2 -  Partial thickness loss of dermis presenting as a shallow open injury with a red, pink wound bed without slough.  Wound Description (Comments):   Present on Admission: Yes            Consultants: None Procedures performed: None  Disposition:  Home with hospice. Diet recommendation:  Discharge Diet Orders (From admission, onward)     Start     Ordered   09/30/23 0000  Diet - low sodium heart healthy        09/30/23 1034           Cardiac diet DISCHARGE MEDICATION: Allergies as of 09/30/2023       Reactions   Ciprofloxacin Nausea And Vomiting, Hives   Codeine Anaphylaxis, Hives, Other (See Comments)   Reaction:  Unknown   Fluconazole Hives   Latex Anaphylaxis, Rash   Meperidine Anaphylaxis, Hives   Reaction:  Unknown Reaction:  Unknown   Unknown   Morphine And Codeine Hives, Anaphylaxis, Other (See Comments)   Reaction:  Unknown   unknown  Other reaction(s): Other (See Comments)  unknown    Doxycycline Nausea And Vomiting   Flagyl [metronidazole] Nausea And Vomiting   Reaction:  Unknown    Influenza Vaccines Swelling   Localized swelling   Tape Other (See Comments)   Pt allergic to Adhesive tape and latex.(  Skin breaks out)   Valtrex [valacyclovir Hcl] Hives   Buprenorphine    Other reaction(s): Other (see comments) unknown   Duloxetine Other (See Comments)   Other Reaction(s): GI intolerance Reaction:  Unknown     Reaction:  Unknown     Reaction:  Unknown   Silicone Other (See Comments)   Pt allergic to Adhesive tape and latex.(  Skin breaks out)   Tramadol    Other reaction(s): Not available   Amoxicillin-pot Clavulanate Nausea And Vomiting   Sulfamethoxazole-trimethoprim Nausea And Vomiting, Rash   Valacyclovir Rash        Medication List     STOP taking these medications    apixaban 5 MG Tabs tablet Commonly known as: ELIQUIS   lidocaine 5 % Commonly known as: LIDODERM   metoprolol tartrate 25 MG tablet Commonly known as: LOPRESSOR       TAKE these medications    amiodarone 100 MG tablet Commonly known as: PACERONE Take 1 tablet (100 mg total) by mouth daily. What changed: Another medication with the same name was removed. Continue taking this medication, and  follow the directions you see here.   ascorbic acid 500 MG tablet Commonly known as: VITAMIN C Take 1 tablet (500 mg total) by mouth daily.   Belsomra 10 MG Tabs Generic drug: Suvorexant Take 1 tablet by mouth at bedtime as needed.   cetirizine 10 MG tablet Commonly known as: ZYRTEC Take 1 tablet (10 mg total) by mouth daily.   cholecalciferol 25 MCG (1000 UNIT) tablet Commonly known as: VITAMIN D3 Take 1,000 Units by mouth daily.   cyclobenzaprine 5 MG tablet Commonly known as: FLEXERIL Take 1 tablet (5 mg total) by mouth 3 (three) times daily as needed for muscle spasms.   diltiazem 120 MG 24 hr capsule Commonly known as: CARDIZEM CD Take 1 capsule (120 mg total) by  mouth daily. Start taking on: October 01, 2023   docusate sodium 100 MG capsule Commonly known as: COLACE Take 100 mg by mouth 2 (two) times daily.   feeding supplement Liqd Take 237 mLs by mouth 2 (two) times daily between meals.   folic acid 1 MG tablet Commonly known as: FOLVITE TAKE ONE TABLET BY MOUTH EVERY DAY   gabapentin 100 MG capsule Commonly known as: NEURONTIN Take 3 capsules (300 mg total) by mouth at bedtime. What changed:  how much to take when to take this   multivitamin with minerals Tabs tablet Take 1 tablet by mouth daily.   oxyCODONE 5 MG immediate release tablet Commonly known as: Oxy IR/ROXICODONE Take 5-10 mg by mouth every 4 (four) hours as needed.   promethazine 12.5 MG tablet Commonly known as: PHENERGAN Take 12.5 mg by mouth every 6 (six) hours as needed.   senna-docusate 8.6-50 MG tablet Commonly known as: Senokot-S Take 2 tablets by mouth 2 (two) times daily. Hold if loose stools   traZODone 150 MG tablet Commonly known as: DESYREL Take 150 mg by mouth at bedtime. What changed: Another medication with the same name was removed. Continue taking this medication, and follow the directions you see here.               Discharge Care Instructions  (From admission, onward)           Start     Ordered   09/30/23 0000  Discharge wound care:       Comments: Follow with hospice   09/30/23 1034            Discharge Exam: Filed Weights   09/26/23 1604  Weight: 53.9 kg   General exam: Appears calm and comfortable  Respiratory system: Clear to auscultation. Respiratory effort normal. Cardiovascular system: Irregularly irregular. No JVD, murmurs, rubs, gallops or clicks. No pedal edema. Gastrointestinal system: Abdomen is nondistended, soft and nontender. No organomegaly or masses felt. Normal bowel sounds heard. Central nervous system: Alert and oriented. No focal neurological deficits. Extremities: Symmetric 5 x 5  power. Skin: No rashes, lesions or ulcers Psychiatry: Judgement and insight appear normal. Mood & affect appropriate.    Condition at discharge: fair  The results of significant diagnostics from this hospitalization (including imaging, microbiology, ancillary and laboratory) are listed below for reference.   Imaging Studies: MR BRAIN WO CONTRAST  Result Date: 09/27/2023 CLINICAL DATA:  Right facial droop EXAM: MRI HEAD WITHOUT CONTRAST TECHNIQUE: Multiplanar, multiecho pulse sequences of the brain and surrounding structures were obtained without intravenous contrast. COMPARISON:  CT head 1 day prior, brain MRI 03/24/2019 FINDINGS: Brain: There is no acute intracranial hemorrhage, extra-axial fluid collection, or acute infarct. Parenchymal volume is normal  for age. The ventricles are normal in size. Patchy and confluent FLAIR signal abnormality in the supratentorial white matter likely reflecting sequela of moderate chronic small-vessel ischemic change is stable. Remote hemorrhage is noted in the bilateral anterior temporal lobes. The pituitary and suprasellar region are normal. There is no mass lesion. There is no mass effect or midline shift. Vascular: Normal flow voids. Skull and upper cervical spine: Normal marrow signal. Sinuses/Orbits: There is moderate mucosal thickening in the right maxillary and sphenoid sinuses. Bilateral lens implants are in place. The globes and orbits are otherwise unremarkable. Other: The mastoid air cells and middle ear cavities are clear. IMPRESSION: 1. No acute intracranial pathology. 2. Remote hemorrhage in the anterior temporal lobes and background chronic small-vessel ischemic change as above. Electronically Signed   By: Lesia Hausen M.D.   On: 09/27/2023 19:44   DG Pelvis 1-2 Views  Result Date: 09/26/2023 CLINICAL DATA:  Fall EXAM: PELVIS - 1-2 VIEW COMPARISON:  None Available. FINDINGS: There is no evidence of pelvic fracture or diastasis. No pelvic bone  lesions are seen. IMPRESSION: Negative. Electronically Signed   By: Charlett Nose M.D.   On: 09/26/2023 19:30   DG Shoulder Right  Result Date: 09/26/2023 CLINICAL DATA:  Fall, right arm pain EXAM: RIGHT SHOULDER - 2+ VIEW COMPARISON:  None Available. FINDINGS: No acute bony abnormality. Specifically, no fracture, subluxation, or dislocation. Old healed posterior right 7th and 8th rib fractures. No visible acute rib fracture. IMPRESSION: No acute bony abnormality. Electronically Signed   By: Charlett Nose M.D.   On: 09/26/2023 19:29   DG Chest Portable 1 View  Result Date: 09/26/2023 CLINICAL DATA:  Fall, left hip pain, right arm pain EXAM: PORTABLE CHEST 1 VIEW COMPARISON:  03/23/2023 FINDINGS: Heart and mediastinal contours are within normal limits. No focal opacities or effusions. No acute bony abnormality. Aortic atherosclerosis. IMPRESSION: No active cardiopulmonary disease. Electronically Signed   By: Charlett Nose M.D.   On: 09/26/2023 19:28   CT Cervical Spine Wo Contrast  Result Date: 09/26/2023 CLINICAL DATA:  Neck trauma.  Found unresponsive face down. EXAM: CT CERVICAL SPINE WITHOUT CONTRAST TECHNIQUE: Multidetector CT imaging of the cervical spine was performed without intravenous contrast. Multiplanar CT image reconstructions were also generated. RADIATION DOSE REDUCTION: This exam was performed according to the departmental dose-optimization program which includes automated exposure control, adjustment of the mA and/or kV according to patient size and/or use of iterative reconstruction technique. COMPARISON:  None Available. FINDINGS: Alignment: Normal. Skull base and vertebrae: No acute fracture. No primary bone lesion or focal pathologic process. Soft tissues and spinal canal: No prevertebral fluid or swelling. No visible canal hematoma. Disc levels: Disc space narrowing and spurring. Mild bilateral degenerative facet disease. Upper chest: Biapical scarring.  Paraseptal emphysema. Other:  None IMPRESSION: Degenerative disc and facet disease. No acute bony abnormality. Electronically Signed   By: Charlett Nose M.D.   On: 09/26/2023 19:27   CT Maxillofacial Wo Contrast  Result Date: 09/26/2023 CLINICAL DATA:  Found face down.  Facial trauma, blunt EXAM: CT MAXILLOFACIAL WITHOUT CONTRAST TECHNIQUE: Multidetector CT imaging of the maxillofacial structures was performed. Multiplanar CT image reconstructions were also generated. RADIATION DOSE REDUCTION: This exam was performed according to the departmental dose-optimization program which includes automated exposure control, adjustment of the mA and/or kV according to patient size and/or use of iterative reconstruction technique. COMPARISON:  None Available. FINDINGS: Osseous: There is a linear lucency through the anterior wall of the right maxillary sinus with frothy  material layering in the right maxillary sinus. Findings concerning for nondisplaced anterior right maxillary wall fracture. No additional fracture. Zygomatic arches and mandible intact. Orbits: No evidence of orbital fracture.  Globes intact. Sinuses: Mucosal thickening in the right maxillary sinus with layering frothy material in the right maxillary sinus and sphenoid sinus. Soft tissues: Soft tissue swelling over the right face, orbit and forehead. Limited intracranial: See head CT report IMPRESSION: Linear lucency in the anterior wall the right maxillary sinus with layering frothy material in the right maxillary and sphenoid sinuses. Findings concerning for nondisplaced fracture. No orbital fracture.  In Electronically Signed   By: Charlett Nose M.D.   On: 09/26/2023 19:26   CT HEAD WO CONTRAST ( )  Result Date: 09/26/2023 CLINICAL DATA:  Head trauma, moderate-severe.  Found face down EXAM: CT HEAD WITHOUT CONTRAST TECHNIQUE: Contiguous axial images were obtained from the base of the skull through the vertex without intravenous contrast. RADIATION DOSE REDUCTION: This exam was  performed according to the departmental dose-optimization program which includes automated exposure control, adjustment of the mA and/or kV according to patient size and/or use of iterative reconstruction technique. COMPARISON:  04/19/2023 FINDINGS: Brain: There is atrophy and chronic small vessel disease changes. No acute intracranial abnormality. Specifically, no hemorrhage, hydrocephalus, mass lesion, acute infarction, or significant intracranial injury. Vascular: No hyperdense vessel or unexpected calcification. Skull: No acute calvarial abnormality. Sinuses/Orbits: Soft tissue swelling over the right face and forehead. See further discussion on maxillofacial CT report Other: None IMPRESSION: Atrophy, chronic microvascular disease. No acute intracranial abnormality. Electronically Signed   By: Charlett Nose M.D.   On: 09/26/2023 19:23    Microbiology: Results for orders placed or performed during the hospital encounter of 09/26/23  Urine Culture     Status: Abnormal   Collection Time: 09/26/23  5:58 PM   Specimen: Urine, Clean Catch  Result Value Ref Range Status   Specimen Description   Final    URINE, CLEAN CATCH Performed at [facility], [address] [place], [place] [address]    Special Requests   Final    NONE Performed at [facility], [address] [place], [place] [address]    Culture >=100,000 COLONIES/mL PSEUDOMONAS AERUGINOSA (A)  Final   Report Status 09/29/2023 FINAL  Final   Organism ID, Bacteria PSEUDOMONAS AERUGINOSA (A)  Final      Susceptibility   Pseudomonas aeruginosa - MIC*    CEFTAZIDIME 8 SENSITIVE Sensitive     CIPROFLOXACIN <=0.25 SENSITIVE Sensitive     GENTAMICIN <=1 SENSITIVE Sensitive     IMIPENEM <=0.25 SENSITIVE Sensitive     * >=100,000 COLONIES/mL PSEUDOMONAS AERUGINOSA    Labs: CBC: Recent Labs  Lab 09/26/23 1620 09/27/23 0439 09/28/23 0538  WBC 13.6* 10.0 8.7  NEUTROABS 12.1*  --   --   HGB 14.3 12.0 12.6  HCT 43.3  34.4* 38.1  MCV 92.5 88.4 91.8  PLT 293 266 265   Basic Metabolic Panel: Recent Labs  Lab 09/26/23 1620 09/27/23 0439 09/28/23 0538  NA 133* 135 136  K 4.3 3.5 3.7  CL 97* 100 100  CO2 25 25 27   GLUCOSE 130* 103* 105*  BUN 27* 24* 28*  CREATININE 1.16* 1.09* 1.17*  CALCIUM 9.2 8.5* 8.7*   Liver Function Tests: Recent Labs  Lab 09/26/23 1620  AST 36  ALT 20  ALKPHOS 387*  BILITOT 0.6  PROT 8.4*  ALBUMIN 3.9   CBG: No results for input(s): "GLUCAP" in the last  168 hours.  Discharge time spent: greater than 30 minutes.  Signed: Marrion Coy, MD [facility] 09/30/2023

## 2023-09-30 NOTE — Care Management Important Message (Signed)
Important Message  Patient Details  Name: [name] MRN: [id number] Date of Birth: 03-31-1946   Important Message Given:  Yes - Medicare IM     Bernadette Hoit 09/30/2023, 2:04 PM

## 2023-09-30 NOTE — TOC Transition Note (Addendum)
Transition of Care [facility]) - CM/SW Discharge Note   Patient Details  Name: Donna Evans MRN: [id number] Date of Birth: 1946-03-18  Transition of Care [facility]) CM/SW Contact:  Darolyn Rua, LCSW Phone Number: 09/30/2023, 10:49 AM   Clinical Narrative:     Update 11:25: Nephew called back reports they changed their mind to wanting to go to SNF prior to going home, CSW called Tiffany at [facility] confirmed they have a bed Room 505, can admit today. RN to call report to [phone]. Nephew will be at hospital to pick patient up at 1:30 to transport to [facility].      Patient to discharge home today, per patient and nephew [name] they have changed their mind and would like to return home with [place] hospice services. CSW has spoke with Rosann Auerbach with [place] at [phone], she reports they are able to accept patient back and requested hospice eval and treat order, reports they can pull from epic no need to fax.   Patient nephew to transport home at discharge.   No additional discharge needs identified.     Final next level of care: Home w Hospice Care Barriers to Discharge: No Barriers Identified   Patient Goals and CMS Choice CMS Medicare.gov Compare Post Acute Care list provided to:: Patient Choice offered to / list presented to : Patient  Discharge Placement                         Discharge Plan and Services Additional resources added to the After Visit Summary for                                       Social Determinants of Health (SDOH) Interventions SDOH Screenings   Food Insecurity: No Food Insecurity (05/22/2023)  Housing: Low Risk  (09/27/2023)  Transportation Needs: No Transportation Needs (09/27/2023)  Utilities: Patient Unable To Answer (09/27/2023)  Depression (PHQ2-9): Low Risk  (07/13/2023)  Recent Concern: Depression (PHQ2-9) - High Risk (04/25/2023)  Financial Resource Strain: Low Risk  (05/22/2023)  Physical Activity:  Unknown (05/22/2023)  Social Connections: Socially Isolated (05/22/2023)  Stress: No Stress Concern Present (05/22/2023)  Tobacco Use: Medium Risk (07/13/2023)     Readmission Risk Interventions    03/24/2023    2:47 PM  Readmission Risk Prevention Plan  Transportation Screening Complete  Medication Review (RN Care Manager) Complete  PCP or Specialist appointment within 3-5 days of discharge Complete  HRI or Home Care Consult Complete  SW Recovery Care/Counseling Consult Complete  Palliative Care Screening Not Applicable  Skilled Nursing Facility Complete

## 2023-09-30 NOTE — Consult Note (Signed)
[facility]   [facility] Inpatient Consult   09/30/2023  TASHEBA SELLARDS Dec 02, 1946 [id number]  [facility] [[facility]]  Accountable Care Organization [ACO] Patient: [employer]  [facility] Liaison remote coverage review for patient admitted to [facility] coverage for Elliot Cousin RMN, HL  *Reviewed for disposition needs for post hospital community follow up and extreme high risk for unplanned readmission score noted.  Chart reviewed and reveals the patient was previously Hospice Care and was considering to return. However, afternoon review of [facility]patient [facility] team notes reveals patient's nephew decided on SNF.    Plan: Disposition undetermined at the time of this review.  For questions,   Charlesetta Shanks, RN, BSN, CCM [place]  [facility], [facility] Liaison Direct Dial: [phone] or secure chat Website: [email or web address]

## 2023-09-30 NOTE — Progress Notes (Signed)
Patient requested for staff not to wake her for care including V/S. Patient observed in bed asleep.

## 2023-09-30 NOTE — Progress Notes (Signed)
RN called report to [facility], report given to [employer], LPN.

## 2023-09-30 NOTE — TOC Progression Note (Signed)
Transition of Care [facility]) - Progression Note    Patient Details  Name: Donna Evans MRN: [id number] Date of Birth: 22-Feb-1946  Transition of Care [facility]) CM/SW Contact  Darolyn Rua, [place] Phone Number: 09/30/2023, 10:25 AM  Clinical Narrative:     CSW spoke with nephew [name] regarding discharge planning, he reports preference for patient to return home with hospice but they have a preference of [facility] (they had [facility] prior to admission) agreeable to call from [facility] liaison to discuss services. He is aware patient medically stable to dc home this afternoon.   Expected Discharge Plan:  (LTC with hospice) Barriers to Discharge: Continued Medical Work up  Expected Discharge Plan and Services       Living arrangements for the past 2 months: Single Family Home                                       Social Determinants of Health (SDOH) Interventions SDOH Screenings   Food Insecurity: No Food Insecurity (05/22/2023)  Housing: Low Risk  (09/27/2023)  Transportation Needs: No Transportation Needs (09/27/2023)  Utilities: Patient Unable To Answer (09/27/2023)  Depression (PHQ2-9): Low Risk  (07/13/2023)  Recent Concern: Depression (PHQ2-9) - High Risk (04/25/2023)  Financial Resource Strain: Low Risk  (05/22/2023)  Physical Activity: Unknown (05/22/2023)  Social Connections: Socially Isolated (05/22/2023)  Stress: No Stress Concern Present (05/22/2023)  Tobacco Use: Medium Risk (07/13/2023)    Readmission Risk Interventions    03/24/2023    2:47 PM  Readmission Risk Prevention Plan  Transportation Screening Complete  Medication Review (RN Care Manager) Complete  PCP or Specialist appointment within 3-5 days of discharge Complete  HRI or Home Care Consult Complete  SW Recovery Care/Counseling Consult Complete  Palliative Care Screening Not Applicable  Skilled Nursing Facility Complete

## 2023-09-30 NOTE — Progress Notes (Signed)
Donna Evans  A and O x 4. VSS. Pt tolerating diet well. No complaints of pain or nausea. IV removed intact, prescriptions given. Pt voiced understanding of discharge instructions with no further questions. Pt discharged via wheelchair with axillary.    Allergies as of 09/30/2023       Reactions   Ciprofloxacin Nausea And Vomiting, Hives   Codeine Anaphylaxis, Hives, Other (See Comments)   Reaction:  Unknown   Fluconazole Hives   Latex Anaphylaxis, Rash   Meperidine Anaphylaxis, Hives   Reaction:  Unknown Reaction:  Unknown   Unknown   Morphine And Codeine Hives, Anaphylaxis, Other (See Comments)   Reaction:  Unknown   unknown  Other reaction(s): Other (See Comments)  unknown   Doxycycline Nausea And Vomiting   Flagyl [metronidazole] Nausea And Vomiting   Reaction:  Unknown    Influenza Vaccines Swelling   Localized swelling   Tape Other (See Comments)   Pt allergic to Adhesive tape and latex.(  Skin breaks out)   Valtrex [valacyclovir Hcl] Hives   Buprenorphine    Other reaction(s): Other (see comments) unknown   Duloxetine Other (See Comments)   Other Reaction(s): GI intolerance Reaction:  Unknown     Reaction:  Unknown     Reaction:  Unknown   Silicone Other (See Comments)   Pt allergic to Adhesive tape and latex.(  Skin breaks out)   Tramadol    Other reaction(s): Not available   Amoxicillin-pot Clavulanate Nausea And Vomiting   Sulfamethoxazole-trimethoprim Nausea And Vomiting, Rash   Valacyclovir Rash        Medication List     STOP taking these medications    apixaban 5 MG Tabs tablet Commonly known as: ELIQUIS   lidocaine 5 % Commonly known as: LIDODERM   metoprolol tartrate 25 MG tablet Commonly known as: LOPRESSOR       TAKE these medications    amiodarone 100 MG tablet Commonly known as: PACERONE Take 1 tablet (100 mg total) by mouth daily. What changed: Another medication with the same name was removed. Continue taking this  medication, and follow the directions you see here.   ascorbic acid 500 MG tablet Commonly known as: VITAMIN C Take 1 tablet (500 mg total) by mouth daily.   Belsomra 10 MG Tabs Generic drug: Suvorexant Take 1 tablet by mouth at bedtime as needed.   cetirizine 10 MG tablet Commonly known as: ZYRTEC Take 1 tablet (10 mg total) by mouth daily.   cholecalciferol 25 MCG (1000 UNIT) tablet Commonly known as: VITAMIN D3 Take 1,000 Units by mouth daily.   cyclobenzaprine 5 MG tablet Commonly known as: FLEXERIL Take 1 tablet (5 mg total) by mouth 3 (three) times daily as needed for muscle spasms.   diltiazem 120 MG 24 hr capsule Commonly known as: CARDIZEM CD Take 1 capsule (120 mg total) by mouth daily. Start taking on: October 01, 2023   docusate sodium 100 MG capsule Commonly known as: COLACE Take 100 mg by mouth 2 (two) times daily.   feeding supplement Liqd Take 237 mLs by mouth 2 (two) times daily between meals.   folic acid 1 MG tablet Commonly known as: FOLVITE TAKE ONE TABLET BY MOUTH EVERY DAY   gabapentin 100 MG capsule Commonly known as: NEURONTIN Take 3 capsules (300 mg total) by mouth at bedtime. What changed:  how much to take when to take this   multivitamin with minerals Tabs tablet Take 1 tablet by mouth daily.   oxyCODONE  5 MG immediate release tablet Commonly known as: Oxy IR/ROXICODONE Take 1-2 tablets (5-10 mg total) by mouth every 4 (four) hours as needed.   promethazine 12.5 MG tablet Commonly known as: PHENERGAN Take 12.5 mg by mouth every 6 (six) hours as needed.   senna-docusate 8.6-50 MG tablet Commonly known as: Senokot-S Take 2 tablets by mouth 2 (two) times daily. Hold if loose stools   traZODone 150 MG tablet Commonly known as: DESYREL Take 150 mg by mouth at bedtime. What changed: Another medication with the same name was removed. Continue taking this medication, and follow the directions you see here.                Discharge Care Instructions  (From admission, onward)           Start     Ordered   09/30/23 0000  Discharge wound care:       Comments: Follow with hospice   09/30/23 1034   09/30/23 0000  Discharge wound care:       Comments: Follow with RN   09/30/23 1051            Vitals:   09/30/23 0809 09/30/23 1235  BP: 112/72 103/83  Pulse: 94 82  Resp: 17 19  Temp: 97.8 F (36.6 C) 99.4 F (37.4 C)  SpO2: 94% 96%    Suzzanne Cloud

## 2023-10-05 DIAGNOSIS — I480 Paroxysmal atrial fibrillation: Secondary | ICD-10-CM | POA: Diagnosis not present

## 2023-10-05 DIAGNOSIS — S02401D Maxillary fracture, unspecified, subsequent encounter for fracture with routine healing: Secondary | ICD-10-CM | POA: Diagnosis not present

## 2023-10-05 DIAGNOSIS — G4700 Insomnia, unspecified: Secondary | ICD-10-CM | POA: Diagnosis not present

## 2023-10-05 DIAGNOSIS — R55 Syncope and collapse: Secondary | ICD-10-CM | POA: Diagnosis not present

## 2023-10-05 DIAGNOSIS — N1832 Chronic kidney disease, stage 3b: Secondary | ICD-10-CM | POA: Diagnosis not present

## 2023-10-05 DIAGNOSIS — F411 Generalized anxiety disorder: Secondary | ICD-10-CM | POA: Diagnosis not present

## 2023-10-05 DIAGNOSIS — I130 Hypertensive heart and chronic kidney disease with heart failure and stage 1 through stage 4 chronic kidney disease, or unspecified chronic kidney disease: Secondary | ICD-10-CM | POA: Diagnosis not present

## 2023-10-05 DIAGNOSIS — Z853 Personal history of malignant neoplasm of breast: Secondary | ICD-10-CM | POA: Diagnosis not present

## 2023-10-05 DIAGNOSIS — I951 Orthostatic hypotension: Secondary | ICD-10-CM | POA: Diagnosis not present

## 2023-10-05 DIAGNOSIS — K219 Gastro-esophageal reflux disease without esophagitis: Secondary | ICD-10-CM | POA: Diagnosis not present

## 2023-10-05 DIAGNOSIS — E43 Unspecified severe protein-calorie malnutrition: Secondary | ICD-10-CM | POA: Diagnosis not present

## 2023-10-05 DIAGNOSIS — G894 Chronic pain syndrome: Secondary | ICD-10-CM | POA: Diagnosis not present

## 2023-10-07 DIAGNOSIS — E43 Unspecified severe protein-calorie malnutrition: Secondary | ICD-10-CM | POA: Diagnosis not present

## 2023-10-07 DIAGNOSIS — Z853 Personal history of malignant neoplasm of breast: Secondary | ICD-10-CM | POA: Diagnosis not present

## 2023-10-07 DIAGNOSIS — F0390 Unspecified dementia without behavioral disturbance: Secondary | ICD-10-CM | POA: Diagnosis not present

## 2023-10-07 DIAGNOSIS — I480 Paroxysmal atrial fibrillation: Secondary | ICD-10-CM | POA: Diagnosis not present

## 2023-10-07 DIAGNOSIS — G894 Chronic pain syndrome: Secondary | ICD-10-CM | POA: Diagnosis not present

## 2023-10-07 DIAGNOSIS — K219 Gastro-esophageal reflux disease without esophagitis: Secondary | ICD-10-CM | POA: Diagnosis not present

## 2023-10-07 DIAGNOSIS — G4700 Insomnia, unspecified: Secondary | ICD-10-CM | POA: Diagnosis not present

## 2023-10-07 DIAGNOSIS — N1832 Chronic kidney disease, stage 3b: Secondary | ICD-10-CM | POA: Diagnosis not present

## 2023-10-07 DIAGNOSIS — R55 Syncope and collapse: Secondary | ICD-10-CM | POA: Diagnosis not present

## 2023-10-07 DIAGNOSIS — F411 Generalized anxiety disorder: Secondary | ICD-10-CM | POA: Diagnosis not present

## 2023-10-07 DIAGNOSIS — I951 Orthostatic hypotension: Secondary | ICD-10-CM | POA: Diagnosis not present

## 2023-10-07 DIAGNOSIS — I130 Hypertensive heart and chronic kidney disease with heart failure and stage 1 through stage 4 chronic kidney disease, or unspecified chronic kidney disease: Secondary | ICD-10-CM | POA: Diagnosis not present

## 2023-10-10 DIAGNOSIS — N1832 Chronic kidney disease, stage 3b: Secondary | ICD-10-CM | POA: Diagnosis not present

## 2023-10-10 DIAGNOSIS — I509 Heart failure, unspecified: Secondary | ICD-10-CM | POA: Diagnosis not present

## 2023-10-10 DIAGNOSIS — F411 Generalized anxiety disorder: Secondary | ICD-10-CM | POA: Diagnosis not present

## 2023-10-10 DIAGNOSIS — I480 Paroxysmal atrial fibrillation: Secondary | ICD-10-CM | POA: Diagnosis not present

## 2023-10-10 DIAGNOSIS — R55 Syncope and collapse: Secondary | ICD-10-CM | POA: Diagnosis not present

## 2023-10-10 DIAGNOSIS — K219 Gastro-esophageal reflux disease without esophagitis: Secondary | ICD-10-CM | POA: Diagnosis not present

## 2023-10-10 DIAGNOSIS — F0390 Unspecified dementia without behavioral disturbance: Secondary | ICD-10-CM | POA: Diagnosis not present

## 2023-10-11 DIAGNOSIS — E43 Unspecified severe protein-calorie malnutrition: Secondary | ICD-10-CM | POA: Diagnosis not present

## 2023-10-11 DIAGNOSIS — I482 Chronic atrial fibrillation, unspecified: Secondary | ICD-10-CM | POA: Diagnosis not present

## 2023-10-11 DIAGNOSIS — I130 Hypertensive heart and chronic kidney disease with heart failure and stage 1 through stage 4 chronic kidney disease, or unspecified chronic kidney disease: Secondary | ICD-10-CM | POA: Diagnosis not present

## 2023-10-11 DIAGNOSIS — F411 Generalized anxiety disorder: Secondary | ICD-10-CM | POA: Diagnosis not present

## 2023-10-11 DIAGNOSIS — R55 Syncope and collapse: Secondary | ICD-10-CM | POA: Diagnosis not present

## 2023-10-11 DIAGNOSIS — G894 Chronic pain syndrome: Secondary | ICD-10-CM | POA: Diagnosis not present

## 2023-10-11 DIAGNOSIS — G4700 Insomnia, unspecified: Secondary | ICD-10-CM | POA: Diagnosis not present

## 2023-10-11 DIAGNOSIS — K219 Gastro-esophageal reflux disease without esophagitis: Secondary | ICD-10-CM | POA: Diagnosis not present

## 2023-10-11 DIAGNOSIS — F0390 Unspecified dementia without behavioral disturbance: Secondary | ICD-10-CM | POA: Diagnosis not present

## 2023-10-11 DIAGNOSIS — I509 Heart failure, unspecified: Secondary | ICD-10-CM | POA: Diagnosis not present

## 2023-10-11 DIAGNOSIS — N1832 Chronic kidney disease, stage 3b: Secondary | ICD-10-CM | POA: Diagnosis not present

## 2023-10-11 DIAGNOSIS — S02401D Maxillary fracture, unspecified, subsequent encounter for fracture with routine healing: Secondary | ICD-10-CM | POA: Diagnosis not present

## 2023-10-12 DIAGNOSIS — R55 Syncope and collapse: Secondary | ICD-10-CM | POA: Diagnosis not present

## 2023-10-12 DIAGNOSIS — E43 Unspecified severe protein-calorie malnutrition: Secondary | ICD-10-CM | POA: Diagnosis not present

## 2023-10-12 DIAGNOSIS — G894 Chronic pain syndrome: Secondary | ICD-10-CM | POA: Diagnosis not present

## 2023-10-12 DIAGNOSIS — I480 Paroxysmal atrial fibrillation: Secondary | ICD-10-CM | POA: Diagnosis not present

## 2023-10-12 DIAGNOSIS — I509 Heart failure, unspecified: Secondary | ICD-10-CM | POA: Diagnosis not present

## 2023-10-12 DIAGNOSIS — K219 Gastro-esophageal reflux disease without esophagitis: Secondary | ICD-10-CM | POA: Diagnosis not present

## 2023-10-14 DIAGNOSIS — F0390 Unspecified dementia without behavioral disturbance: Secondary | ICD-10-CM | POA: Diagnosis not present

## 2023-10-14 DIAGNOSIS — I509 Heart failure, unspecified: Secondary | ICD-10-CM | POA: Diagnosis not present

## 2023-10-14 DIAGNOSIS — I480 Paroxysmal atrial fibrillation: Secondary | ICD-10-CM | POA: Diagnosis not present

## 2023-10-14 DIAGNOSIS — N1832 Chronic kidney disease, stage 3b: Secondary | ICD-10-CM | POA: Diagnosis not present

## 2023-10-14 DIAGNOSIS — F411 Generalized anxiety disorder: Secondary | ICD-10-CM | POA: Diagnosis not present

## 2023-10-14 DIAGNOSIS — R55 Syncope and collapse: Secondary | ICD-10-CM | POA: Diagnosis not present

## 2023-10-14 DIAGNOSIS — E46 Unspecified protein-calorie malnutrition: Secondary | ICD-10-CM | POA: Diagnosis not present

## 2023-10-14 DIAGNOSIS — K219 Gastro-esophageal reflux disease without esophagitis: Secondary | ICD-10-CM | POA: Diagnosis not present

## 2023-10-14 DIAGNOSIS — S02401D Maxillary fracture, unspecified, subsequent encounter for fracture with routine healing: Secondary | ICD-10-CM | POA: Diagnosis not present

## 2023-10-14 DIAGNOSIS — E43 Unspecified severe protein-calorie malnutrition: Secondary | ICD-10-CM | POA: Diagnosis not present

## 2023-10-14 DIAGNOSIS — G4700 Insomnia, unspecified: Secondary | ICD-10-CM | POA: Diagnosis not present

## 2024-01-02 ENCOUNTER — Other Ambulatory Visit: Payer: Self-pay

## 2024-01-02 ENCOUNTER — Emergency Department
Admission: EM | Admit: 2024-01-02 | Discharge: 2024-01-02 | Disposition: A | Discharge status: Home / Self Care | Attending: Emergency Medicine | Admitting: Emergency Medicine

## 2024-01-02 ENCOUNTER — Emergency Department

## 2024-01-02 DIAGNOSIS — G8929 Other chronic pain: Secondary | ICD-10-CM | POA: Insufficient documentation

## 2024-01-02 DIAGNOSIS — M25552 Pain in left hip: Secondary | ICD-10-CM | POA: Diagnosis not present

## 2024-01-02 DIAGNOSIS — M25551 Pain in right hip: Secondary | ICD-10-CM | POA: Insufficient documentation

## 2024-01-02 DIAGNOSIS — I4510 Unspecified right bundle-branch block: Secondary | ICD-10-CM | POA: Diagnosis not present

## 2024-01-02 DIAGNOSIS — M47816 Spondylosis without myelopathy or radiculopathy, lumbar region: Secondary | ICD-10-CM | POA: Diagnosis not present

## 2024-01-02 DIAGNOSIS — M545 Low back pain, unspecified: Secondary | ICD-10-CM | POA: Diagnosis not present

## 2024-01-02 DIAGNOSIS — J449 Chronic obstructive pulmonary disease, unspecified: Secondary | ICD-10-CM | POA: Insufficient documentation

## 2024-01-02 DIAGNOSIS — S0990XA Unspecified injury of head, initial encounter: Secondary | ICD-10-CM | POA: Diagnosis not present

## 2024-01-02 DIAGNOSIS — M549 Dorsalgia, unspecified: Secondary | ICD-10-CM | POA: Diagnosis not present

## 2024-01-02 DIAGNOSIS — S199XXA Unspecified injury of neck, initial encounter: Secondary | ICD-10-CM | POA: Diagnosis not present

## 2024-01-02 DIAGNOSIS — I700 Atherosclerosis of aorta: Secondary | ICD-10-CM | POA: Diagnosis not present

## 2024-01-02 DIAGNOSIS — M47812 Spondylosis without myelopathy or radiculopathy, cervical region: Secondary | ICD-10-CM | POA: Diagnosis not present

## 2024-01-02 DIAGNOSIS — J439 Emphysema, unspecified: Secondary | ICD-10-CM | POA: Diagnosis not present

## 2024-01-02 DIAGNOSIS — M4802 Spinal stenosis, cervical region: Secondary | ICD-10-CM | POA: Diagnosis not present

## 2024-01-02 DIAGNOSIS — W08XXXA Fall from other furniture, initial encounter: Secondary | ICD-10-CM | POA: Insufficient documentation

## 2024-01-02 DIAGNOSIS — W19XXXA Unspecified fall, initial encounter: Secondary | ICD-10-CM

## 2024-01-02 DIAGNOSIS — I6782 Cerebral ischemia: Secondary | ICD-10-CM | POA: Diagnosis not present

## 2024-01-02 NOTE — ED Triage Notes (Signed)
First nurse note: pt to ED [facility] from home for fall 2300 last night from couch. +hit head, no blood thinners. C/o right hip and lower back pain. No obvious shortening or deformity.  DNR present

## 2024-01-02 NOTE — ED Provider Notes (Signed)
[facility] Provider Note    Event Date/Time   First MD Initiated Contact with Patient 01/02/24 1259     (approximate)   History   Fall and Back Pain   HPI  Donna Evans is a 78 y.o. female with history of stroke, fibromyalgia, COPD, multiple other chronic medical problems, see history presents emergency department from her due to a fall.  Patient arrived via EMS.  Fell at 11 PM last night.  Hit her head.  No blood thinners.  Also complaining of right hip and lower back pain.  Patient also has a DNR, family is with her      Physical Exam   Triage Vital Signs: ED Triage Vitals  Encounter Vitals Group     BP 01/02/24 1058 (!) 125/101     Systolic BP Percentile --      Diastolic BP Percentile --      Pulse Rate 01/02/24 1058 76     Resp 01/02/24 1058 18     Temp 01/02/24 1058 98.3 F (36.8 C)     Temp Source 01/02/24 1058 Oral     SpO2 01/02/24 1058 91 %     Weight 01/02/24 1057 106 lb (48.1 kg)     Height 01/02/24 1057 5\' 7"  (1.702 m)     Head Circumference --      Peak Flow --      Pain Score 01/02/24 1109 0     Pain Loc --      Pain Education --      Exclude from Growth Chart --     Most recent vital signs: Vitals:   01/02/24 1058  BP: (!) 125/101  Pulse: 76  Resp: 18  Temp: 98.3 F (36.8 C)  SpO2: 91%     General: Awake, no distress.   CV:  Good peripheral perfusion. regular rate and  rhythm Resp:  Normal effort.  Abd:  No distention.   Other:  Lumbar spine tender, right hip tender, neurovascular intact   ED Results / Procedures / Treatments   Labs (all labs ordered are listed, but only abnormal results are displayed) Labs Reviewed - No data to display   EKG     RADIOLOGY CT of the head, C-spine, lumbar spine, right hip    PROCEDURES:   Procedures   MEDICATIONS ORDERED IN ED: Medications - No data to display   IMPRESSION / MDM / ASSESSMENT AND PLAN / ED COURSE  I reviewed the triage vital signs and  the nursing notes.                              Differential diagnosis includes, but is not limited to subdural, SAH, C-spine fracture, lumbar fracture, right hip fracture, multiple contusions, fall  Patient's presentation is most consistent with acute illness / injury with system symptoms.   Due to the fall, patient's age and chronic problems CT of the head, C-spine, lumbar spine, and x-ray of the right hip ordered  CT of the head, C-spine and lumbar spine were independently reviewed interpreted by me as having no acute abnormality.  Radiologist does comment on a L2 compression fracture that is chronic and not new.  X-ray of the right hip independently reviewed interpreted by me as being negative for any acute abnormality  I did explain these findings to the patient and family.  I did offer pain medication but family is concerned they will  not be able to get her into the house if she is drowsy and limp as she has chronic opiate use.  Would prefer to let her take her regular medications once they arrive home.  I feel this is a conservative treatment plan.  Shared decision making feel be okay for them to go ahead take the patient home.  They are to return the emergency department if worsening.  Follow-up with her regular doctor as needed.  Everyone is in agreement treatment plan.  She was discharged stable condition.      FINAL CLINICAL IMPRESSION(S) / ED DIAGNOSES   Final diagnoses:  Fall, initial encounter  Other chronic back pain     Rx / DC Orders   ED Discharge Orders     None        Note:  This document was prepared using Dragon voice recognition software and may include unintentional dictation errors.    Faythe Ghee, PA-C 01/02/24 1316    Jene Every, MD 01/02/24 [phone]

## 2024-01-02 NOTE — ED Provider Triage Note (Signed)
Emergency Medicine Provider Triage Evaluation Note  Donna Evans , a 78 y.o. female  was evaluated in triage.  Pt complains of fall, hit head, low back pain, hip pain.  Review of Systems  Positive:  Negative:   Physical Exam  BP (!) 125/101 (BP Location: Right Arm)   Pulse 76   Temp 98.3 F (36.8 C) (Oral)   Resp 18   Ht 5\' 7"  (1.702 m)   Wt 48.1 kg   SpO2 91%   BMI 16.60 kg/m  Gen:   Awake, no distress   Resp:  Normal effort  MSK:   Moves extremities without difficulty  Other:    Medical Decision Making  Medically screening exam initiated at 11:04 AM.  Appropriate orders placed.  [name] was informed that the remainder of the evaluation will be completed by another provider, this initial triage assessment does not replace that evaluation, and the importance of remaining in the ED until their evaluation is complete.     Faythe Ghee, PA-C 01/02/24 1105

## 2024-01-02 NOTE — ED Notes (Signed)
Patient transported to CT 

## 2024-01-02 NOTE — ED Triage Notes (Signed)
Refer to first nurse note 

## 2024-01-14 DEATH — deceased
